# Patient Record
Sex: Male | Born: 1965 | Race: White | Hispanic: No | Smoking: Current every day smoker
Health system: Southern US, Community
[De-identification: ages and names within clinical notes are randomized; demographics above are authoritative.]

## PROBLEM LIST (undated history)

## (undated) DIAGNOSIS — M1990 Unspecified osteoarthritis, unspecified site: Secondary | ICD-10-CM

## (undated) DIAGNOSIS — K4090 Unilateral inguinal hernia, without obstruction or gangrene, not specified as recurrent: Secondary | ICD-10-CM

## (undated) DIAGNOSIS — J189 Pneumonia, unspecified organism: Secondary | ICD-10-CM

## (undated) DIAGNOSIS — F319 Bipolar disorder, unspecified: Secondary | ICD-10-CM

## (undated) DIAGNOSIS — G4700 Insomnia, unspecified: Secondary | ICD-10-CM

## (undated) DIAGNOSIS — M797 Fibromyalgia: Secondary | ICD-10-CM

## (undated) DIAGNOSIS — F329 Major depressive disorder, single episode, unspecified: Secondary | ICD-10-CM

## (undated) DIAGNOSIS — S0990XA Unspecified injury of head, initial encounter: Secondary | ICD-10-CM

## (undated) DIAGNOSIS — R7989 Other specified abnormal findings of blood chemistry: Secondary | ICD-10-CM

## (undated) DIAGNOSIS — IMO0002 Reserved for concepts with insufficient information to code with codable children: Secondary | ICD-10-CM

## (undated) DIAGNOSIS — M549 Dorsalgia, unspecified: Secondary | ICD-10-CM

## (undated) DIAGNOSIS — E039 Hypothyroidism, unspecified: Secondary | ICD-10-CM

## (undated) DIAGNOSIS — R251 Tremor, unspecified: Secondary | ICD-10-CM

## (undated) DIAGNOSIS — M8580 Other specified disorders of bone density and structure, unspecified site: Secondary | ICD-10-CM

## (undated) DIAGNOSIS — F32A Depression, unspecified: Secondary | ICD-10-CM

## (undated) DIAGNOSIS — S82142A Displaced bicondylar fracture of left tibia, initial encounter for closed fracture: Secondary | ICD-10-CM

## (undated) DIAGNOSIS — R945 Abnormal results of liver function studies: Secondary | ICD-10-CM

## (undated) DIAGNOSIS — I2510 Atherosclerotic heart disease of native coronary artery without angina pectoris: Secondary | ICD-10-CM

## (undated) DIAGNOSIS — S060X9A Concussion with loss of consciousness of unspecified duration, initial encounter: Secondary | ICD-10-CM

## (undated) DIAGNOSIS — IMO0001 Reserved for inherently not codable concepts without codable children: Secondary | ICD-10-CM

## (undated) DIAGNOSIS — M6282 Rhabdomyolysis: Secondary | ICD-10-CM

## (undated) DIAGNOSIS — J439 Emphysema, unspecified: Secondary | ICD-10-CM

## (undated) DIAGNOSIS — I10 Essential (primary) hypertension: Secondary | ICD-10-CM

## (undated) DIAGNOSIS — K589 Irritable bowel syndrome without diarrhea: Secondary | ICD-10-CM

## (undated) DIAGNOSIS — F419 Anxiety disorder, unspecified: Secondary | ICD-10-CM

## (undated) DIAGNOSIS — E785 Hyperlipidemia, unspecified: Secondary | ICD-10-CM

## (undated) DIAGNOSIS — K219 Gastro-esophageal reflux disease without esophagitis: Secondary | ICD-10-CM

## (undated) DIAGNOSIS — M7918 Myalgia, other site: Secondary | ICD-10-CM

## (undated) DIAGNOSIS — N189 Chronic kidney disease, unspecified: Secondary | ICD-10-CM

## (undated) DIAGNOSIS — G8929 Other chronic pain: Secondary | ICD-10-CM

## (undated) DIAGNOSIS — S060XAA Concussion with loss of consciousness status unknown, initial encounter: Secondary | ICD-10-CM

## (undated) HISTORY — DX: Depression, unspecified: F32.A

## (undated) HISTORY — PX: COLONOSCOPY: SHX174

## (undated) HISTORY — DX: Unspecified osteoarthritis, unspecified site: M19.90

## (undated) HISTORY — DX: Hypothyroidism, unspecified: E03.9

## (undated) HISTORY — DX: Other specified abnormal findings of blood chemistry: R79.89

## (undated) HISTORY — DX: Fibromyalgia: M79.7

## (undated) HISTORY — PX: ESOPHAGOGASTRODUODENOSCOPY: SHX1529

## (undated) HISTORY — DX: Major depressive disorder, single episode, unspecified: F32.9

## (undated) HISTORY — PX: UPPER GASTROINTESTINAL ENDOSCOPY: SHX188

## (undated) HISTORY — DX: Other specified disorders of bone density and structure, unspecified site: M85.80

## (undated) HISTORY — DX: Emphysema, unspecified: J43.9

## (undated) HISTORY — DX: Abnormal results of liver function studies: R94.5

## (undated) HISTORY — PX: INGUINAL HERNIA REPAIR: SUR1180

## (undated) HISTORY — PX: SPINAL CORD STIMULATOR IMPLANT: SHX2422

## (undated) HISTORY — PX: CERVICAL DISC SURGERY: SHX588

## (undated) HISTORY — DX: Myalgia, other site: M79.18

---

## 2000-04-20 ENCOUNTER — Encounter: Payer: Self-pay | Admitting: Sports Medicine

## 2000-04-20 ENCOUNTER — Ambulatory Visit (HOSPITAL_COMMUNITY): Admission: RE | Admit: 2000-04-20 | Discharge: 2000-04-20 | Payer: Self-pay | Admitting: Sports Medicine

## 2000-05-20 ENCOUNTER — Encounter: Payer: Self-pay | Admitting: Neurosurgery

## 2000-05-20 ENCOUNTER — Ambulatory Visit (HOSPITAL_COMMUNITY): Admission: RE | Admit: 2000-05-20 | Discharge: 2000-05-21 | Payer: Self-pay | Admitting: Neurosurgery

## 2000-06-08 ENCOUNTER — Encounter: Payer: Self-pay | Admitting: Neurosurgery

## 2000-06-08 ENCOUNTER — Ambulatory Visit (HOSPITAL_COMMUNITY): Admission: RE | Admit: 2000-06-08 | Discharge: 2000-06-08 | Payer: Self-pay | Admitting: Neurosurgery

## 2000-06-15 ENCOUNTER — Encounter: Admission: RE | Admit: 2000-06-15 | Discharge: 2000-08-06 | Payer: Self-pay | Admitting: Neurosurgery

## 2000-07-13 ENCOUNTER — Encounter: Payer: Self-pay | Admitting: Neurosurgery

## 2000-07-13 ENCOUNTER — Ambulatory Visit (HOSPITAL_COMMUNITY): Admission: RE | Admit: 2000-07-13 | Discharge: 2000-07-13 | Payer: Self-pay | Admitting: Neurosurgery

## 2000-08-07 ENCOUNTER — Ambulatory Visit (HOSPITAL_COMMUNITY): Admission: RE | Admit: 2000-08-07 | Discharge: 2000-08-07 | Payer: Self-pay | Admitting: Neurosurgery

## 2000-08-07 ENCOUNTER — Encounter: Payer: Self-pay | Admitting: Neurosurgery

## 2000-08-24 ENCOUNTER — Encounter: Admission: RE | Admit: 2000-08-24 | Discharge: 2000-10-20 | Payer: Self-pay | Admitting: Neurosurgery

## 2000-10-12 ENCOUNTER — Ambulatory Visit (HOSPITAL_COMMUNITY): Admission: RE | Admit: 2000-10-12 | Discharge: 2000-10-12 | Payer: Self-pay | Admitting: Neurosurgery

## 2000-10-12 ENCOUNTER — Encounter: Payer: Self-pay | Admitting: Neurosurgery

## 2000-11-18 ENCOUNTER — Ambulatory Visit (HOSPITAL_COMMUNITY): Admission: RE | Admit: 2000-11-18 | Discharge: 2000-11-18 | Payer: Self-pay | Admitting: Neurosurgery

## 2000-11-18 ENCOUNTER — Encounter: Payer: Self-pay | Admitting: Neurosurgery

## 2000-12-28 ENCOUNTER — Encounter: Admission: RE | Admit: 2000-12-28 | Discharge: 2001-01-30 | Payer: Self-pay | Admitting: Anesthesiology

## 2002-03-14 ENCOUNTER — Encounter: Payer: Self-pay | Admitting: Emergency Medicine

## 2002-03-14 ENCOUNTER — Encounter: Payer: Self-pay | Admitting: Internal Medicine

## 2002-03-15 ENCOUNTER — Encounter: Payer: Self-pay | Admitting: Internal Medicine

## 2002-03-15 ENCOUNTER — Inpatient Hospital Stay (HOSPITAL_COMMUNITY): Admission: EM | Admit: 2002-03-15 | Discharge: 2002-03-17 | Payer: Self-pay | Admitting: Emergency Medicine

## 2002-03-16 ENCOUNTER — Encounter: Payer: Self-pay | Admitting: Internal Medicine

## 2002-03-17 ENCOUNTER — Encounter: Payer: Self-pay | Admitting: Internal Medicine

## 2002-03-27 ENCOUNTER — Emergency Department (HOSPITAL_COMMUNITY): Admission: EM | Admit: 2002-03-27 | Discharge: 2002-03-27 | Payer: Self-pay | Admitting: Emergency Medicine

## 2002-04-12 ENCOUNTER — Encounter: Payer: Self-pay | Admitting: Internal Medicine

## 2002-07-16 ENCOUNTER — Ambulatory Visit (HOSPITAL_COMMUNITY): Admission: RE | Admit: 2002-07-16 | Discharge: 2002-07-16 | Payer: Self-pay | Admitting: Neurology

## 2002-07-16 ENCOUNTER — Encounter: Payer: Self-pay | Admitting: Neurology

## 2003-03-07 ENCOUNTER — Encounter
Admission: RE | Admit: 2003-03-07 | Discharge: 2003-06-05 | Payer: Self-pay | Admitting: Physical Medicine & Rehabilitation

## 2003-06-23 ENCOUNTER — Encounter
Admission: RE | Admit: 2003-06-23 | Discharge: 2003-09-21 | Payer: Self-pay | Admitting: Physical Medicine & Rehabilitation

## 2003-08-08 ENCOUNTER — Encounter: Payer: Self-pay | Admitting: Internal Medicine

## 2003-09-22 ENCOUNTER — Inpatient Hospital Stay (HOSPITAL_COMMUNITY): Admission: EM | Admit: 2003-09-22 | Discharge: 2003-09-24 | Payer: Self-pay | Admitting: Emergency Medicine

## 2003-09-27 ENCOUNTER — Encounter
Admission: RE | Admit: 2003-09-27 | Discharge: 2003-12-26 | Payer: Self-pay | Admitting: Physical Medicine & Rehabilitation

## 2004-01-17 ENCOUNTER — Encounter
Admission: RE | Admit: 2004-01-17 | Discharge: 2004-02-27 | Payer: Self-pay | Admitting: Physical Medicine & Rehabilitation

## 2004-02-27 ENCOUNTER — Encounter
Admission: RE | Admit: 2004-02-27 | Discharge: 2004-05-27 | Payer: Self-pay | Admitting: Physical Medicine & Rehabilitation

## 2004-03-12 ENCOUNTER — Ambulatory Visit: Payer: Self-pay | Admitting: Physical Medicine & Rehabilitation

## 2004-07-05 ENCOUNTER — Ambulatory Visit: Payer: Self-pay | Admitting: Physical Medicine & Rehabilitation

## 2004-07-05 ENCOUNTER — Encounter
Admission: RE | Admit: 2004-07-05 | Discharge: 2004-10-03 | Payer: Self-pay | Admitting: Physical Medicine & Rehabilitation

## 2004-10-11 ENCOUNTER — Encounter
Admission: RE | Admit: 2004-10-11 | Discharge: 2005-01-09 | Payer: Self-pay | Admitting: Physical Medicine & Rehabilitation

## 2004-10-15 ENCOUNTER — Ambulatory Visit: Payer: Self-pay | Admitting: Physical Medicine & Rehabilitation

## 2004-11-27 ENCOUNTER — Ambulatory Visit: Payer: Self-pay | Admitting: Physical Medicine & Rehabilitation

## 2005-03-27 ENCOUNTER — Ambulatory Visit: Payer: Self-pay | Admitting: Family Medicine

## 2005-04-04 ENCOUNTER — Ambulatory Visit: Payer: Self-pay | Admitting: Family Medicine

## 2005-04-29 ENCOUNTER — Ambulatory Visit: Payer: Self-pay | Admitting: Family Medicine

## 2005-05-22 ENCOUNTER — Ambulatory Visit (HOSPITAL_COMMUNITY): Admission: RE | Admit: 2005-05-22 | Discharge: 2005-05-22 | Payer: Self-pay | Admitting: Physical Therapy

## 2005-07-07 ENCOUNTER — Ambulatory Visit: Payer: Self-pay | Admitting: Family Medicine

## 2006-06-01 ENCOUNTER — Ambulatory Visit: Payer: Self-pay | Admitting: Family Medicine

## 2006-06-01 LAB — CONVERTED CEMR LAB
Albumin: 4.7 g/dL (ref 3.5–5.2)
Alkaline Phosphatase: 98 units/L (ref 39–117)
TSH: 2.135 microintl units/mL (ref 0.350–5.50)
Total Bilirubin: 0.2 mg/dL — ABNORMAL LOW (ref 0.3–1.2)

## 2006-06-29 ENCOUNTER — Ambulatory Visit: Payer: Self-pay | Admitting: Family Medicine

## 2006-07-06 ENCOUNTER — Ambulatory Visit (HOSPITAL_COMMUNITY): Admission: RE | Admit: 2006-07-06 | Discharge: 2006-07-06 | Payer: Self-pay | Admitting: Anesthesiology

## 2006-07-30 ENCOUNTER — Ambulatory Visit: Payer: Self-pay | Admitting: Family Medicine

## 2006-07-30 LAB — CONVERTED CEMR LAB
Albumin: 4.1 g/dL (ref 3.5–5.2)
Alkaline Phosphatase: 72 units/L (ref 39–117)
BUN: 7 mg/dL (ref 6–23)
Basophils Absolute: 0 10*3/uL (ref 0.0–0.1)
Cholesterol: 187 mg/dL (ref 0–200)
GFR calc Af Amer: 121 mL/min
HDL: 51.6 mg/dL (ref >39.0)
Hemoglobin: 15 g/dL (ref 13.0–17.0)
LDL Cholesterol: 110 mg/dL — ABNORMAL HIGH (ref 0–99)
Lymphocytes Relative: 17.7 % (ref 12.0–46.0)
MCHC: 34 g/dL (ref 30.0–36.0)
Monocytes Absolute: 0.7 10*3/uL (ref 0.2–0.7)
Monocytes Relative: 5.8 % (ref 3.0–11.0)
Neutro Abs: 8.6 10*3/uL — ABNORMAL HIGH (ref 1.4–7.7)
Potassium: 3.9 meq/L (ref 3.5–5.1)
Total Protein: 7.1 g/dL (ref 6.0–8.3)

## 2006-10-23 ENCOUNTER — Ambulatory Visit: Payer: Self-pay | Admitting: Internal Medicine

## 2006-10-23 ENCOUNTER — Encounter: Payer: Self-pay | Admitting: Internal Medicine

## 2006-10-28 DIAGNOSIS — F3110 Bipolar disorder, current episode manic without psychotic features, unspecified: Secondary | ICD-10-CM

## 2006-10-28 DIAGNOSIS — G8929 Other chronic pain: Secondary | ICD-10-CM

## 2006-10-28 DIAGNOSIS — E785 Hyperlipidemia, unspecified: Secondary | ICD-10-CM | POA: Insufficient documentation

## 2006-10-28 DIAGNOSIS — F319 Bipolar disorder, unspecified: Secondary | ICD-10-CM

## 2006-10-28 DIAGNOSIS — F329 Major depressive disorder, single episode, unspecified: Secondary | ICD-10-CM

## 2006-10-28 DIAGNOSIS — F411 Generalized anxiety disorder: Secondary | ICD-10-CM | POA: Insufficient documentation

## 2006-10-28 DIAGNOSIS — K219 Gastro-esophageal reflux disease without esophagitis: Secondary | ICD-10-CM

## 2006-10-28 HISTORY — DX: Gastro-esophageal reflux disease without esophagitis: K21.9

## 2006-10-28 HISTORY — DX: Other chronic pain: G89.29

## 2006-10-28 HISTORY — DX: Generalized anxiety disorder: F41.1

## 2006-10-28 HISTORY — DX: Bipolar disorder, current episode manic without psychotic features, unspecified: F31.10

## 2007-02-05 ENCOUNTER — Telehealth (INDEPENDENT_AMBULATORY_CARE_PROVIDER_SITE_OTHER): Payer: Self-pay | Admitting: *Deleted

## 2007-03-23 ENCOUNTER — Encounter: Admission: RE | Admit: 2007-03-23 | Discharge: 2007-06-21 | Payer: Self-pay | Admitting: Anesthesiology

## 2007-06-21 ENCOUNTER — Encounter: Payer: Self-pay | Admitting: Family Medicine

## 2007-07-28 ENCOUNTER — Encounter: Payer: Self-pay | Admitting: Family Medicine

## 2008-06-02 HISTORY — PX: SEPTOPLASTY: SUR1290

## 2008-08-09 ENCOUNTER — Ambulatory Visit: Payer: Self-pay | Admitting: Internal Medicine

## 2008-08-09 DIAGNOSIS — K589 Irritable bowel syndrome without diarrhea: Secondary | ICD-10-CM

## 2008-08-09 HISTORY — DX: Irritable bowel syndrome, unspecified: K58.9

## 2008-08-10 ENCOUNTER — Telehealth: Payer: Self-pay | Admitting: Internal Medicine

## 2008-10-26 ENCOUNTER — Ambulatory Visit (HOSPITAL_COMMUNITY): Admission: RE | Admit: 2008-10-26 | Discharge: 2008-10-27 | Payer: Self-pay | Admitting: Neurosurgery

## 2009-05-19 ENCOUNTER — Inpatient Hospital Stay (HOSPITAL_COMMUNITY): Admission: RE | Admit: 2009-05-19 | Discharge: 2009-05-24 | Payer: Self-pay | Admitting: Psychiatry

## 2009-05-19 ENCOUNTER — Emergency Department (HOSPITAL_COMMUNITY): Admission: EM | Admit: 2009-05-19 | Discharge: 2009-05-19 | Payer: Self-pay | Admitting: Emergency Medicine

## 2009-05-19 ENCOUNTER — Ambulatory Visit: Payer: Self-pay | Admitting: Psychiatry

## 2009-05-23 ENCOUNTER — Emergency Department (HOSPITAL_COMMUNITY): Admission: EM | Admit: 2009-05-23 | Discharge: 2009-05-23 | Payer: Self-pay | Admitting: Emergency Medicine

## 2010-09-02 LAB — CBC
Hemoglobin: 15.4 g/dL (ref 13.0–17.0)
MCHC: 33.6 g/dL (ref 30.0–36.0)
MCV: 93.3 fL (ref 78.0–100.0)
RDW: 12.9 % (ref 11.5–15.5)

## 2010-09-02 LAB — DIFFERENTIAL
Basophils Absolute: 0.2 10*3/uL — ABNORMAL HIGH (ref 0.0–0.1)
Basophils Relative: 2 % — ABNORMAL HIGH (ref 0–1)
Eosinophils Absolute: 0.2 10*3/uL (ref 0.0–0.7)
Eosinophils Relative: 2 % (ref 0–5)
Monocytes Absolute: 0.6 10*3/uL (ref 0.1–1.0)
Neutro Abs: 6.2 10*3/uL (ref 1.7–7.7)

## 2010-09-02 LAB — BASIC METABOLIC PANEL
CO2: 23 mEq/L (ref 19–32)
Calcium: 8.9 mg/dL (ref 8.4–10.5)
Chloride: 106 mEq/L (ref 96–112)
Glucose, Bld: 93 mg/dL (ref 70–99)
Sodium: 141 mEq/L (ref 135–145)

## 2010-09-02 LAB — RAPID URINE DRUG SCREEN, HOSP PERFORMED
Amphetamines: NOT DETECTED
Barbiturates: NOT DETECTED
Benzodiazepines: NOT DETECTED
Opiates: NOT DETECTED

## 2010-09-10 LAB — BASIC METABOLIC PANEL
BUN: 9 mg/dL (ref 6–23)
Calcium: 10.2 mg/dL (ref 8.4–10.5)
Creatinine, Ser: 0.76 mg/dL (ref 0.4–1.5)
GFR calc non Af Amer: >60 mL/min (ref >60)
Glucose, Bld: 82 mg/dL (ref 70–99)
Potassium: 4.6 mEq/L (ref 3.5–5.1)

## 2010-09-10 LAB — CBC
Platelets: 262 10*3/uL (ref 150–400)
RDW: 13 % (ref 11.5–15.5)
WBC: 7.2 10*3/uL (ref 4.0–10.5)

## 2010-10-15 NOTE — Op Note (Signed)
NAMEJONG, [name]                ACCOUNT NO.:  [id number]   MEDICAL RECORD NO.:  [id number]          PATIENT TYPE:  OIB   LOCATION:  3534                         FACILITY:  [facility]   PHYSICIAN:  Reinaldo Meeker, M.D. DATE OF BIRTH:  03/14/1966   DATE OF PROCEDURE:  10/26/2008  DATE OF DISCHARGE:                               OPERATIVE REPORT   PREOPERATIVE DIAGNOSIS:  Degenerative disk disease with chronic back and  leg pain.   POSTOPERATIVE DIAGNOSIS:  Degenerative disk disease with chronic back  and leg pain.   PROCEDURE:  Insertion of permanent spinal cord stimulator.   SURGEON:  Reinaldo Meeker, MD   PROCEDURE IN DETAIL:  After being placed in the prone position, the  patient's thoracic spine, lumbar spine, left buttock area were prepped  and draped in the usual sterile fashion.  Localizing fluoroscopy was  used prior to incision to identify the appropriate level.  Midline  incision made over the spinous process of T10 and T11.  Using Bovie  cutting current, the incision was carried down to the spinous processes.  Subperiosteal dissection was then carried out on the left side of the  spinous process and lamina of T11 and self-retaining retractor was  placed for exposure.  X-rays showed approach to the appropriate level.  Left hemilaminotomy was performed and enlarged appropriately in order to  be able to slide the Tripole 10-mm wide [employer] Jude lead into an adequate  position.  Initial attempts kept having the lead go more to the right  than we were happy with.  We kept trying to readjust this until we got  into a good position.  We then made a battery pouch and generator pouch  in the left buttock without difficulty.  Any bleeding was controlled  with unipolar coagulation at that level.  A pass was made subcutaneously  from the buttock incision up to the thoracic incision and the lead  passed without difficulty.  It was then placed in excellent position,  which was confirmed  with fluoroscopy.  This went from the T8-9 disk to  the upper edge of the T10, which was felt to the best location based on  the patient's response of trial.  The lead was then secured to the  battery generator and impedance testing showed good impedances on both  leads.  The lead was then secured with the aid of torque, Allen wrench  and the battery pack inserted without difficulty.  Irrigation was  carried out at both incisions.  Any bleeding at the upper level  controlled with bipolar coagulation and Gelfoam.  Both the wounds were  closed with interrupted Vicryl on the fascia, subcutaneous, and  subcuticular layers and Dermabond on the skin.  Sterile dressings were  then applied.  The patient was extubated and taken to recovery room in  stable condition.           ______________________________  Reinaldo Meeker, M.D.     ROK/MEDQ  D:  10/26/2008  T:  10/27/2008  Job:  [id number]
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
## 2010-10-18 NOTE — Assessment & Plan Note (Signed)
DATE OF VISIT:  July 08, 2004.   MEDICAL RECORD NUMBER:  [id number].   DATE OF BIRTH:  1966-04-22.   [name] is here regarding his post laminectomy cervical syndrome with  myofascial components.  I have not seen him since October.  He states that  his pain has been fairly stable.  He still rates it at about a 7/10.  He is  interested in trying to increase his OxyContin a bit today.  He has been  working on posture with exercises and stretching, although still tended to  fall in the forward head position.  When he tries to exaggerate his cervical  posture, he tends to get low back pain.  The pain improves with heat, ice  and medications.  It generally worsens with walking, bending and sitting.  It effects many of his quality of life indices as well.  He has been fairly  well controlled regarding his psychiatric condition.   SOCIAL HISTORY:  He continues to live with his wife who works full time.  The patient smokes daily.   REVIEW OF SYSTEMS:  The patient reports constipation.  Denies fever, chills,  weight changes, recent problems with his mood, weakness, numbness, dizziness  and problems with cold or flu symptoms.  The patient has had no heart  problems.   PHYSICAL EXAMINATION:  The blood pressure is 118/59, the pulse is 94 and the  respiratory rate is 22.  Saturating 97% on room air.  The patient walks with  a slight limp to the left side.  Affect is bright and much more animated  than I have seen him recent months.  Appearance is better kept that usual as  well.  His neck remains painful on the spinous processes from about C7 up to  C3 and C2 levels.  Rhomboids are also tight, left greater than right side,  with spasm.  They seem to improve with better posture, but than his low back  began to hurt.  Cervical range of motion was fair today.  He had normal  motor and sensory exam of both upper extremities.  Shoulder range of motion  was fairly intact.  The patient needed  cues for posture, but overall his  posture was improve from prior exams.  He presents with the shoulders  internally rotated and the head in the forward position.  The low back is  slightly painful to movement, particularly with facet maneuvers, left  greater than right, today.  Cognitively the patient is appropriate.  The  heart is regular rate and rhythm.  The lungs were clear.  The abdomen was  soft and nontender.  The extremities showed no cyanosis, clubbing or edema.   ASSESSMENT:  1.  Status post cervical laminectomy.  2.  Myofascial pain.  3.  Bicipital tendonitis of the left shoulder.  4.  Fibromyalgia.  5.  Low back pain with levoscoliosis and probable facet arthropathy.   PLAN:  1.  Will increase OxyContin to 40 mg q.12h.  Will decrease fentanyl patch to      75 mcg q.72h. with the goal to decrease further if possible.  2.  The patient needs to continue with stretching and regular exercises to      his low back and he needs to work on his cervical range as well.  3.  Continue oxycodone for breakthrough pain at 5 mg q.8h. p.r.n.  4.  Discussed a low-residence weight program with the patient today, which I  think would be beneficial.  He is unable to do a lot of the exercises he      needs to do which place him in a supine or prone position because it      hurts too much to get back up.  5.  The patient may use Flexeril for spasms q.8h. p.r.n.  6.  Continue Restoril 30 mg q.h.s. for sleep.  7.  I will see the patient back in one month's time.      ZTS/MedQ  D:  07/08/2004 15:58:03  T:  07/08/2004 18:31:26  Job #:  [id number]
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
## 2010-10-18 NOTE — Assessment & Plan Note (Signed)
Yovany is back regarding his post laminectomy syndrome. He did well with the  bicipital tendon injection and the sternocleidomastoid injection at last  visit. He did not like the Kadian, however, as he did not feel it relieved  his pain, and it caused him significant constipation. He presents today with  pain still in the 7 out of 10 range. Pain is mostly in the cervical region  into the shoulders. The left shoulder does remain tender, more so than the  right. His mid back is also bothering him more of late. He was unable to  take the Neurontin secondary to spasm. He has been somewhat adherent to his  home stretching and range of motion program.   REVIEW OF SYSTEMS:  The patient denied any shortness of breath, chest pain,  nausea, diarrhea, vomiting. He does note constipation and as spoken of  above. He has had no cold or flu like symptoms or weakness and/or numbness  in the lower or upper extremities. Sleep remains interrupted. Does have some  occasional tremors. He is stable from a psychiatric standpoint.   PHYSICAL EXAMINATION:  The patient is generally pleasant in no acute  distress. Blood pressure is 118/75, pulse is 90, saturating 100% on room  air. The patient continues to sit with a forward head posture. He has pain  and tightness throughout the trapezius and rhomboid musculature,  sternocleidomastoid and levator scapular muscles. Trapezius areas appear  less tender today. He is still very tender in both sternocleidomastoid  regions. Suboccipital musculature was also taunt. Motor exam was 5/5.  Sensory exam was intact. Reflexes were 2+ in both the upper and lower  extremities today. Range of motion of the neck remains poor, particularly  with extension as this causes most of his pain. Lumbar range of motion was  stable.   ASSESSMENT:  1. Post laminectomy syndrome.  2. Myofascial pain.  3. Bicipital tendinitis involving the left short head.  4. Questionable fibromyalgia.   PLAN:  1. We will perform no injections today. We will try him on a new pain     regimen, discontinuing the Kadian and beginning him on a fentanyl patch     75 mcg q.72h. Also started him back on low dose OxyContin with the goal     of reducing this to off over the next few months' time.  2. He may continue his oxycodone IR for breakthrough pain one q.8h. p.r.n.  3. Will continue with Flexeril 10 mg for spasms.  4. We will hold off on trying anything new for neuropathic pain at this     point.  5. I encouraged ongoing stretching and range of motion exercise.  6. May consider followup MR scan of the C spine.  7. See the patient back in approximately one month's time.      Ranelle Oyster, M.D.   ZTS/MedQ  D:  08/01/2003 13:15:34  T:  08/01/2003 13:41:20  Job #:  [id number]   cc:   [facility]  [place]

## 2010-10-18 NOTE — Assessment & Plan Note (Signed)
REFERRING PHYSICIAN:  Lelon Perla, M.D.   [name] is back regarding his post laminectomy syndrome and cervical pain.  He  missed his last appointment due to car troubles.  He comes in today  complaining of neck pain, predominantly in the upper neck but also in the  middle back around the C7 level.  He states his pain is at a 7/10 on  average.  He is having problems sleeping.  Low back pain is an issue but not  to such an extent.  He does not feel that the Fentanyl patch is helping,  although he is tolerating the medicine itself.  He remains on OxyContin 20  mg q.12h. in addition to the Fentanyl which is at 75 mcg q.72h.  He uses  oxycodone 5 mg q.8h. p.r.n. for breakthrough pain.  Flexeril helps to a  certain extent with spasms.  His family doctor placed him on Klonopin to  help him to rest but this has not been beneficial at this point.  Trazodone  at 150 mg did not help.   REVIEW OF SYMPTOMS:  The patient denies any short of breath, chest pain,  wheezing or coughing.  He does have some weakness, dizziness, and spasms.  He denies any visual complaints, problems with mood or headaches.  He denies  nausea, vomiting, reflux and diarrhea.  He has had occasional obstipation.  Denies fever or weight changes.   PHYSICAL EXAMINATION:  GENERAL APPEARANCE:  The patient walks with slight  limp.  Affect is generally alert.  Appearance is slightly disheveled.  VITAL SIGNS:  Blood pressure is 112/63, pulse 85, he is saturating 98% on  room air .  NEUROLOGIC:  The patient had palpable pain with palpation of the right upper  trapezius as well as the suboccipital muscle to a lesser extent.  There is  pain with palpation near the C6 and T1 vertebral bodies.  He has severe  spasm in the right middle rhomboid.  Posture remains very poor with a  significant resting flexion in the cervical spine and thoracic spine with  the head forward posture and extension of the upper cervical muscles to  compensate.   The remainder of range of motion was minimal in all planes  anywhere from 20 to 40 degrees.  All motion seemed to cause discomfort to a  certain extent.  Motor examination in the upper and lower extremities is  near 5/5.  Reflexes are 2+ and sensory examination is intact.   ASSESSMENT:  1. Post laminectomy syndrome, 722.81.  2. Myofascial pain, 723.9.  3. Bicipital tendinitis of the left shoulder, 726.12.  4. Questionable fibromyalgia, 729.1.   PLAN:  1. After informed consent, we injected four areas with trigger point shots     today using 2 mL of 1% lidocaine.  We targeted the right upper trapezius,     right middle rhomboid, and the paraspinal musculature at T1 and C6.  The     patient had relief with these.  2. Will increase his Fentanyl patch to 100 mcg q.72h.  We will keep the     OxyContin on board for now.  3. The patient will use oxycodone IR for breakthrough pain q.8h. p.r.n.  4. He may continue with Flexeril 10 mg q.8h. p.r.n. for spasms.  5. For sleep, we added Restoril 30 mg q.h.s.  The patient is to stop his     Klonopin.  6. The patient needs to work on his posture which is horrible at  this point     and he states the desire to do so, although has not followed through with     it at this point.  7. The patient plans on smoking cessation beginning next month.  8. Will see the patient back in one month's time.      Ranelle Oyster, M.D.   ZTS/MedQ  D:  09/29/2003 10:32:12  T:  09/29/2003 11:38:24  Job #:  [id number]   cc:   Lelon Perla, M.D.   [facility]  [place], [place]

## 2010-10-18 NOTE — Discharge Summary (Signed)
NAME:  [name], [name]                          ACCOUNT NO.:  [id number]   MEDICAL RECORD NO.:  [id number]                   PATIENT TYPE:  INP   LOCATION:  0345                                 FACILITY:  [facility]   PHYSICIAN:  Iva Boop, M.D. [facility]           DATE OF BIRTH:  Oct 03, 1965   DATE OF ADMISSION:  03/13/2002  DATE OF DISCHARGE:  03/17/2002                                 DISCHARGE SUMMARY   ADMISSION DIAGNOSES:  1. The patient is a 45 year old white male with recurrent nausea, vomiting,     abdominal pain, diarrhea.  Etiology not clear.  Rule out medication-     induced gastroparesis, functional etiology secondary to severe anxiety     versus severe irritable bowel syndrome.  2. Recent diagnosis of Mallory-Weiss tear and gastritis made in [place],     [place].  3. Gastroesophageal reflux disease.  4. Chronic pain syndrome on chronic narcotics for cervical radiculopathy and     low back pain.  5. Agarophobia.   DISCHARGE DIAGNOSES:  1. Recurrent nausea, vomiting, and abdominal pain with sporadic diarrhea.     Gastrointestinal workup negative, and symptoms felt to be functional in     nature/irritable bowel syndrome.  The patient has had extensive negative     gastrointestinal evaluation at this time.  2. Recent diagnosis of Mallory-Weiss tear and gastritis made in [place],     [place].  3. Gastroesophageal reflux disease.  4. Chronic pain syndrome on chronic narcotics for cervical radiculopathy and     low back pain.  5. Agarophobia.   CONSULTATIONS:  None.   PROCEDURES:  None.   X-RAYS::  1. Small-bowel follow-through.  2. Gastric emptying scan.  3. [facility] HIDA scan.  4. Upper abdominal ultrasound and plain abdominal films.   BRIEF HISTORY:  The patient is a pleasant 45 year old white male, previously  a primary patient of Dr. Francesca Jewett in [place], also known to Dr. Braulio Conte  in [place], whose wife works for [employer] in [place].   The  patient has had a significant history of chronic pain syndrome secondary to  low back pain and cervical radiculopathy for which he has been on chronic  narcotics in the form of OxyContin and Roxicet .  He is also disabled  secondary to severe social disorder/agarophobia and anxiety.  The patient  has a history of IBS and GERD.  He reports recurrent episodes of nausea,  vomiting, and diarrhea which have recurred over the past year or so at least  five or six times.  He had been hospitalized a few times a [facility]  for similar symptoms, twice within the past month.  At this time he was just  discharged on Friday, March 11, 2002, from [place] after an admission  with nausea, vomiting, hematemesis, and diarrhea.  He was endoscoped, found  to have a Mallory-Weiss tear and evidence of gastritis.  The  patient reports  that he was discharged on March 11, 2002, still not feeling well and not  keeping down p.o.'s.  Over the weekend he had continued to have vomiting and  abdominal pain and presented to the ER here when he was unable to keep any  of his medicines down.  He was noted to be hemodynamically stable and  admitted for further diagnostic workup and hydration.  At the time of  admission it was felt that some of his symptoms may have been secondary to  withdrawal given all of his usual pain medications, etc.   LABORATORY DATA:  On March 14, 2002, WBC 10.1, hemoglobin 15.4, hematocrit  44.4, MCV of 87, platelets 453.  On March 15, 2002, WBC 6.8, hemoglobin  12.3, hematocrit of 35, platelets 323.  On March 14, 2002, potassium 3.3.  Follow-up on March 15, 2002, with a potassium of 3.8, glucose was 105.  On  admission BUN 12, creatinine 0.8, albumin 4.3.  Urinalysis negative with the  exception of 40 ketones.  LFTs negative with the exception of a mildly  elevated alkaline phosphatase at 139.  Stool for wbc's, few present.  Stool  for C. difficile negative.  Stool  culture negative.  Stool for O&P negative.   X-ray studies:  Small-bowel follow-through on March 16, 2002, normal.  [facility]  HIDA scan on March 17, 2002:  Final report pending.  Preliminary report  normal, with normal ejection fraction.  Abdominal ultrasound on March 14, 2002, showed mild fatty infiltration of the liver, otherwise negative.  Plain abdominal films on March 14, 2002:  Slight increase in intestinal  gas pattern.  Possible mild ileus.  Gastric emptying scan on March 15, 2002:  Abnormal, with 11.4% remaining at two hours.   HOSPITAL COURSE:  The patient was admitted to the service of Dr. Stan Head, who was covering the hospital.  He was placed on IV fluids for  rehydration, continued on his usual medications, and started on IV Protonix  and IV Reglan as well as antiemetics.  We tried to keep him on his oral pain  medications, which he seemed to be able to keep down after admission.  He  underwent workup as outlined above.  Initial films showed a possible mild  ileus.  However, the remainder of his studies were negative.  The patient  continued to complain of various abdominal pains and intermittent nausea and  vomiting; however, he was definitely able to keep down p.o.'s when observed  on an intermittent basis.  He complained of increased diarrhea possibly  associated with Reglan, and this was discontinued after a normal gastric  emptying scan.  On March 17, 2002, after a complete evaluation, the  patient was discharged to home in stable condition with instructions to  remain on all of his usual medications, which include:  1. Paxil 80 mg q.a.m.  2. Zyprexa, I believe, 15 h.s.  3. Neurontin 100 t.i.d.  4. OxyContin 80 b.i.d.  5. Roxicet  or Roxicodone 15 mg up to 5 tablets q.d. p.r.n.  6. Zanaflex 4 mg t.i.d.  7. Levbid 1 p.o. b.i.d.  8. FiberCon daily.  9. Aspirin q.o.d.  10.      Multivitamin daily.  11.      Lidoderm patch 5% daily.  12.      Lomotil  p.r.n. 13.      Prilosec 20 b.i.d.   I did give him a prescription for Lomotil to use on a p.r.n.  basis and also  gave him a prescription for Robinul Forte 2 mg p.o. b.i.d. to try in place  of Levbid to see if this will benefit his symptoms.  The patient and his  wife wish to switch to [facility] for his GI care.  He was given a follow-up  appointment with Dr. Stan Head on April 12, 2002, at 1:30 p.m.  I have  asked them to establish a new primary care physician if they do not wish to  return to Dr. Jeanne Ivan care.  The patient and his wife were also reassured  that he has had an extensive evaluation and that we have not found any  significant problems and that they should initially attempt to try to  control these recurrent episodes at home with antiemetics and antidiarrheals  as needed.  The patient does have follow-up with Dr. Vear Clock  for pain management and Dr. Jennelle Human with psychiatry to discuss altering his  regimen including using a Duragesic patch in place of his OxyContin, which  may be helpful during these episodes of recurrent nausea and vomiting,  particularly as he will not suffer the withdrawal effects.       Mike Gip, P.A.-C. [facility]                Iva Boop, M.D. [facility]    AE/MEDQ  D:  03/17/2002  T:  03/18/2002  Job:  [id number]

## 2010-10-18 NOTE — Assessment & Plan Note (Signed)
[name] is back regarding his chronic low back and neck pain.  He had good  results with the  left trapezius trigger point injection as well as the left  bicipital tendon injection.  He did not place any ice to this left shoulder  after we injected it and has been existing with his positional exercises.  He tells me that OxyContin 80 mg was generic and did not help his pain.  He has used Oxycodone 5 mg for breakthrough pain q.8h.  We increased the  Neurontin to 600 mg t.i.d. and he noted no significant affects.  Sleep has  still been generally poor.   The patient rates his pain today at average of 7 out of 10.  The patient's  pain is noted in the shoulders, mid back and into the hips and knees.  The  patient was given Klonopin 1.5 mg by Dr. Drue Novel for sleep and this does  not seem to be beneficial.  He uses Flexeril on a p.r.n. basis with some  minimal relief.  The patient is generally frustrated by his pain and wants  better control.   REVIEW OF SYSTEMS:  The patient is without any shortness of breath, chest  pain, nausea, vomiting or diarrhea.  No cold or flu-like symptoms.  No  weakness or numbness in the extremities.  Sleep is noted as above and  remains interrupted at best.  The patient does note some tremors.  Psychiatrically he has been stable.   PHYSICAL EXAMINATION:  GENERAL:  The patient is pleasant and in no acute  distress.  VITAL SIGNS:  Blood pressure is 120/76, pulse is 85, saturating 100% on room  air.   The patient continues to sit with a significant short head posture.  He has  some discomfort in the rhomboids bilaterally.  Trapezius muscles are much  less tender today.  He denies significant tenderness in the upper  sternocleidomastoid on the left side.  The facet areas were minimally  tender.  He did have some tenderness along the soft tissue in the left  suboccipital and left semispinalis regions.  Motor exam was nearly 5 out of  5.  Sensory exam was intact.  He  had tenderness once again along the short  head of the biceps tendon on the left side.  The right side was nontender.  Low back remained essentially unchanged with some nonspecific tenderness  along the lumbar facets and paraspinous.  Lower extremity strength was 5 out  of 5.  Reflexes were 2+ and normal sensation throughout.  Lumbar range of  motion was 60 with flexion, 45 degrees extension, 25 with lateral bending  and rotation.   ASSESSMENT:  1. Failed back syndrome.  2. Myofascial pain.  3. Continued biceps tendonitis involving the left short head.  4. Questionable fibromyalgia.  5. Bipolar disorder.  6. Possible C5 radiculopathy on the left.   PLAN:  1. After informed consent we injected both the left bicipital tendon short     head and the left sternocleidomastoid region today with 3 cc of 1%     lidocaine and 40 mg of Kenalog/2 cc of 1% lidocaine respectively.  The     patient tolerated these well.  2. For better baseline pain relief will change him from OxyContin 80 mg     q.12h to Kadian 60 mg q.12h.  I asked him to give this at least a 1 week     period to work.  He will continue on Oxycodone  5 mg q.8h for break     through pain.  3. Continue with Neurontin.  4. Continue with his postural exercises for his neck.  5. He will need something different for sleep for which I will defer to     psychiatry.  6. I will see the patient back in approximately 1 month's time.      Ranelle Oyster, M.D.   ZTS/MedQ  D:  06/26/2003 17:02:28  T:  06/27/2003 06:02:48  Job #:  [id number]   cc:   [facility]  [place], [place], M.D.  [address]  [place] [address]  Fax: [phone]

## 2010-10-18 NOTE — Assessment & Plan Note (Signed)
[name] is back regarding his post laminectomy cervical syndrome with postural  and myofascial components.  He states he has had increased pain between the  scapulae as well as the low back after feeling a pop or spine moving apart  after he bent over on two occasions.  Since this point, his pain has  increased.  He rates his pain as a 6 out of 10.  He describes it as sharp,  stabbing, and constant.  Interferes with general activity, relations with  others, and enjoyment of life.  The pain seems to be worse in the morning  and at night.  It worsens with walking, bending, sitting, and inactivity.  It improves with rest and his medications.  He maintains 75 mcg Duragesic  patch every three days as well as OxyContin 40 mg q.12h.  He is using  oxycodone 5 mg q.8h. p.r.n.  He is not consistent with exercises.  He is  walking very little at home.  He rarely drives.  Psychiatrically, he has  been fairly stable since last visit.   SOCIAL HISTORY:  His wife and child live with him.  The patient still smokes  one and a half packs per day.   REVIEW OF SYSTEMS:  The patient reports numbness, tremor, tingling, trouble  walking, depression, anxiety, constipation, decreased appetite, urine  retention.   PHYSICAL EXAMINATION:  VITAL SIGNS:  Blood pressure is 111/69, pulse is 67,  respiratory rate is 16.  He is sating 98% on room air.  GENERAL:  The patient is flat and disheveled.  He had multiple marks and  adhesive tracks left on the skin from his prior patches which he had not  cleaned off.  The patient seemed to initiate fairly well in the room with me  today and was fairly alert.  MUSCULOSKELETAL:  Motor strength is 5/5.  Coordination was slightly  decreased.  Sensation was intact.  No obvious dyskinesias were seen today.  The patient taut bands of muscle on the right thoracic paraspinal area at  the T5-T4 level.  There is some generalized spasm in the lower lumbar spine.  He had counter clockwise  rotation of the facets at L4, clockwise rotation at  T4.  The patient continued with the head forward posture.  He has pain in  the upper cervical spinous processes today.  MENTAL STATUS:  Cognitively, the patient was generally appropriate.  HEART:  Regular rate and rhythm.  LUNGS:  Clear.  ABDOMEN:  Soft nontender.  EXTREMITIES:  Intact with good color.   ASSESSMENT:  1.  Status post cervical laminectomy.  2.  Myofascial pain and postural abnormalities.  3.  Bicipital tendonitis to the left shoulder.  4.  Questionable fibromyalgia.  5.  Bipolar disorder.   PLAN:  1.  Would like to consolidate his long acting opiates into one.  We will      taper off OxyContin and begin the patient on 125 mcg of Fentanyl to be      taken every 72 hours.  The patient agreed to this approach.  We will      still use oxycodone 5 mg q.8h. p.r.n. for breakthrough pain.  2.  After informed consent, we injected the right T4 trigger point today      with 2cc of 1% lidocaine.  The patient tolerated this well.  3.  The patient needs to pursue active stretching and aerobic exercise as      tolerated.  4.  I gave the patient  a flyer pertaining to IDD therapy.  They will get      back to me if they decide they would like to pursue this to work on      thoracic and lumbar alignment and posture.  5.  I gave the patient chlorohydrate 500 mg q.h.s. for sleep.  He will stop      his Restoril.  6.  I will see the patient back in about a month's time.       ZTS/MedQ  D:  10/15/2004 16:56:39  T:  10/16/2004 00:43:40  Job #:  [id number]

## 2010-10-18 NOTE — Op Note (Signed)
[facility]  Patient:    [name], [name]                       MRN: [id number] Proc. Date: 05/20/00 Adm. Date:  [id number] Disc. Date: [id number] Attending:  Donalee Citrin P                           Operative Report  PREOPERATIVE DIAGNOSIS:  Cervical radiculopathy C5 and C6, left greater than right.  POSTOPERATIVE DIAGNOSIS:  Cervical radiculopathy C5 and C6, left greater than right.  OPERATION PERFORMED:  SURGEON:  Gary P. Roney Jaffe., M.D.  ASSISTANT:  Julio Sicks, M.D.  ANESTHESIA:  General endotracheal.  IV FLUIDS:  1200 cc.  ESTIMATED BLOOD LOSS:  Less than 100.  INDICATIONS FOR PROCEDURE:  The patient is a very pleasant 45 year old gentleman who has had longstanding progressive worsening neck and arm pain much worse in his left arm, that radiates into his shoulder as well as into his forearm with occasional numbness and tingling in the first two fingers of his left hand.  It also goes down his right arm to his first two fingers occasionally but nowhere near as bad as his left.  He has failed conservative therapy with physical therapy.  He is on escalating doses of narcotic and his pain has gotten progressively worse.  His physical exam was consistent with weakness in his deltoids, biceps, and triceps bilaterally, left greater than right to about 4+ out of 5.  Preoperative imaging revealed spondylosis at 4-5 and 5-6 predominantly on the left C5 nerve root and bilaterally at C6.  We extensively discussed the risks and benefits of surgery with the patient, who decided to proceed.  DESCRIPTION OF PROCEDURE:  The patient was brought to the operating room and was induced under general anesthesia.  He was positioned supine.  The right side of his neck was prepped and draped after a shoulder roll had been placed and his neck in slight extension.  Preoperative x-ray confirmed a marker over the C5 vertebral body.  A curvilinear incision was made  from just off the midline to the anterior border of the sternocleidomastoid with a twin blade scalpel.  The superficial layer of the platysma was dissected out and the platysma was then divided longitudinally.  The deep layer of the platysma was then divided out and the avascular plane between the sternocleidomastoid and the omohyoid was developed down to the prevertebral fascia.  The carotid was palpated and retracted laterally and the plane between the carotid and the esophagus was developed down to the prevertebral fascia.  The prevertebral fascia was then divided with Kittners.  Then intraoperative x-ray confirmed the C4-5 interspace, then the longus coli was reflected laterally overlying the body of C4, C5 and C6.  A self-retaining retractor was placed and the interspace at C4-5 and C5-6 were incised with an 11 blade scalpel and pituitary rongeurs cleaned out the anterior part of the annulus.  Attention was first taken to C4-5 interspace.  The Anspach drill with a blue 8 drill bit was used to drill off the remainder of the anterior margin of the annulus down to the posterior longitudinal ligament and the osteophytes on the posterior margin of the C5 vertebral body.  Then using a 1 and 2 mm Kerrison punch, the remainder of the posterior annulus and the posterior longitudinal ligament was removed in piecemeal fashion.  There was noted to  be a large osteophyte coming off the C4 vertebral body compressing the proximal aspect of the C5 nerve root on the left.  This was cleaned off.  The thecal sac was visualized and the C5 nerve root foramen was opened up both bilaterally.  At the end of the diskectomy the thecal sac and both nerve roots were completely decompressed. The wound was copiously irrigated.  Gelfoam was used to maintain hemostasis. Then attention was taken to the C5-6 interspace. This procedure was repeated with the Anspach drill with the blue 8 drill bit was used to drill down to  the posterior osteophyte.  Then a 1 and 2 mm Kerrison punch were used to get underneath the osteophytes and remove the posterior margin of the annulus. The posterior longitudinal ligament was identified, removed in piecemeal fashion.  A couple of large disk fragments were removed that were compressing the ligament against the C5 and C6 nerve roots bilaterally.  These were removed with ease and the remainder of the vertebral bodies and end plates were underbitten to expose the thecal sac and both C6 nerve roots.  They were probed with the black hook nerve probe, noted to be completely decompressed. This was also copiously irrigated.  Gelfoam was overlaid.  7 mm fibular allograft was then inserted after the interbody spreader had been placed under compression and then attention was taken to the 4-5 interspace.  A 6 mm fibular allograft was inserted after the interbody spreader had been placed also under compression.  The wound was copiously irrigated.  The remainder of the anterior margin of the vertebral bodies were cleaned off to receive the plate.  A 42.5 plate was selected and sized up and noted to be in good position, then the fixed drill guide with a 13 mm drill bit was used to drill the pilot hole in the C6 vertebral body on the right.  Then attention was taken, this was tapped with a 13 mm tap and a 13 mm screw was inserted.  Then attention was taken up to the C4 vertebral body.  This procedure was repeated at both left and right side at C4 and then the remainder of the screw was placed at C6.  ____________two screws were placed in C5 in similar fashion. The wound was copiously irrigated.  Postoperative x-ray confirmed good location of bone graft and the plates and screws.  Meticulous hemostasis was maintained.  The platysma was closed with 3-0 interrupted Vicryls.  The skin was closed with a 4-0 running subcuticular.  Benzoin and Steri-Strips were applied.  At the end of the case all  needle counts and sponge counts were correct. DD:  05/20/00 TD:  05/21/00 Job: [id number]

## 2010-10-18 NOTE — H&P (Signed)
NAME:  [name], [name]                          ACCOUNT NO.:  [id number]   MEDICAL RECORD NO.:  [id number]                   PATIENT TYPE:  INP   LOCATION:  5504                                 FACILITY:  [facility]   PHYSICIAN:  Lelon Perla, M.D.               DATE OF BIRTH:  [birth year]   DATE OF ADMISSION:  09/22/2003  DATE OF DISCHARGE:                                HISTORY & PHYSICAL   ADMISSION DIAGNOSIS:  Near syncope.   HISTORY OF PRESENT ILLNESS:  The patient is a 45 year old white male who  presents to the office with multiple episodes of near syncope, two in the  last two days.  The patient gives a history of last night passing out,  falling, and hitting his head on a fan, though there are no witnesses to  tell us how long he was out for.  His young daughter was the only person who  saw anything happen.  The patient states he has been feeling very weak and  dizzy lately.  Denies any chest pain, shortness of breath, or palpitations.   PAST MEDICAL HISTORY:  1. Hyperlipidemia.  2. Anxiety.  3. Panic disorder with severe depression.  4. GERD.  5. IBS.  6. Closed head injury in the past.   PAST SURGICAL HISTORY:  In December 2001, he had a diskectomy and a fusion  of C4-5 and C5-6.   FAMILY HISTORY:  His father diabetes, MI, and CABG.  Mother with rheumatoid  arthritis, hypertension, MI, and CHF.  A brother with diabetes and  hypertension.   MEDICATIONS:  1. Paxil 40 mg daily.  2. Zyprexa 20 mg at night.  3. Lipitor 40 mg daily.  4. Wellbutrin 300 mg in the morning.  5. Risperdal 10 mg in the morning and 4 mg at night.  6. OxyContin [facility].  7. Lithium 450 mg q.a.m. and 1-1/2 at night.  8. Duragesic patch 75 mcg every 72 hours.  9. Hycosamine p.r.n.  10.      Phenergan p.r.n.  11.      Lomotil.  12.      Percocet.  13.      Flexeril.   PHYSICAL EXAMINATION:  GENERAL:  The patient was awake, alert, and oriented,  but very dizzy when  he sat up.  VITAL SIGNS:  175 pounds, temperature is 98.2, pulse is 108, respirations  20, blood pressure when sitting was 116/74, when laying down was 122/78,  when standing up was 88/58.  HEENT:  Eyes:  Pupils equal, round, reactive to light.  NECK:  Supple, no JVD, no bruits.  HEART:  Positive S1 and S2.  No murmurs were appreciated.  LUNGS:  Clear bilaterally.  No wheezes, rhonchi, or rales.  NEUROLOGIC:  Cranial nerves were intact.  The patient was orthostatic.   LABORATORY DATA:  EKG showed flipped T-waves in 2, 3, AVF.  A repeat EKG  done shortly after showed flattening of the T-waves in 2, 3, and AVF.   ASSESSMENT AND PLAN:  Near syncope.  Rule out myocardial infarction.  Will  admit to telemetry with intravenous fluids, cardiology consult,  echocardiogram, carotid Dopplers, CPK x3.                                                Lelon Perla, M.D.    Shawnie Dapper  D:  09/22/2003  T:  09/24/2003  Job:  [id number]

## 2010-10-18 NOTE — Assessment & Plan Note (Signed)
MEDICAL RECORD NUMBER:  [id number].   [name] is here in followup of his post laminectomy syndrome and cervical pain  in the context of myofascial pain and postural abnormalities.  I last saw  [name] back in April.  He has had some increased pain in the thoracic spine  since mid July.  He does not recall any specific accident or problem that  took place.  He continues with the fentanyl patches which seem to be helpful  for the first two days and then he has problems afterwards.  He has had  problems with adhesion with the generic patches.  His pain rates at a level  of 3-7/10.  On average it is about a 6/10.  The pain is most prominent in  the mid back, as well as the cervical region in between his shoulder blades.  He also complains of some knee pain.  The pain generally improves with rest,  heat and medication, but worsens with walking, bending, sitting, working and  sometimes with the weather.  He is using OxyContin 20 mg q.12h. in addition  to the patch and IR oxycodone 5 mg one q.8h. p.r.n.  The Restoril seems to  have helped at nighttime for sleep at 30 mg q.h.s. and the Flexeril has  helped with muscle spasms 10 mg t.i.d.   REVIEW OF SYSTEMS:  The patient denies any chest pain, shortness of breath,  cold, flu, wheezing or coughing symptoms.  Denies seizures, but reports some  weakness and spasms.  Denies blurred vision, anxiety, problems with sleep,  suicidal thoughts or agitation.  Denies nausea, vomiting, reflux, urinary  frequency or abdominal pain.  He does report constipation.  Denies weight  changes, bleeding, fever, chills, skin breakdown or bruising.   PHYSICAL EXAMINATION:  On physical examination today, the blood pressure is  105/69, pulse 82, respiratory rate 16 and he is saturating 99% on room air.  He walks with a limp and posture remains poor with kyphosis essentially  noted in the mid thoracic to lower cervical spine.  He walks with his  shoulders rounded.  He is  generally alert.  His appearance is disheveled  today.   On examination of his back, there is significant spasm of the paraspinals in  the thoracic region between T7 and L1.  They are painful with palpation.  He  can improve his posture with cueing, but still meets resistance when doing  so.  No obvious curvature to the left or right noted on exam today.  Motor  exam was 5/5 in both upper and lower extremities.  Reflexes are 2+ and the  sensory exam remains nonfocal in all four extremities.  Lumbar flexion is 40  degrees, extension 10 degrees and neck movement is 20-40 degrees in planes.   ASSESSMENT:  1. Post laminectomy syndrome.  40981  2. Myofascial pain.  7239  3. Bicipital tendonitis, left shoulder.  19147  4. Fibromyalgia.  7291   PLAN:  1. We injected four trigger points today in the thoracic back, each with 2     ml of 1% lidocaine.  The patient tolerated these well.  2. Will continue with fentanyl patch 100 mcg q.72h.  We will switch him to     Duragesic to see if he has better adhesion, as well as pain relief.  Will     keep his OxyContin at 20 mg q.12h., as well as oxycodone 5 mg q.8h.     p.r.n.  3. I refilled his Flexeril  today 10 mg q.8h. p.r.n., as well as Zestril 30     mg q.h.s.  4. I will send him to [facility] for a hyperextension orthosis to     work on his posture and give him some     pain relief.  I asked him to wear this during the daytime as tolerated.     He may doff this at night.  5. I will see the patient back in about a month's time.      Ranelle Oyster, M.D.   ZTS/MedQ  D:  01/19/2004 17:10:12  T:  01/20/2004 22:10:31  Job #:  [id number]   cc:   Loreen Freud, M.D.
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
## 2010-10-21 ENCOUNTER — Emergency Department (HOSPITAL_COMMUNITY)
Admission: EM | Admit: 2010-10-21 | Discharge: 2010-10-22 | Disposition: A | Discharge status: Other Acute Inpatient Hospital | Payer: 59 | Attending: Emergency Medicine | Admitting: Emergency Medicine

## 2010-10-21 DIAGNOSIS — E039 Hypothyroidism, unspecified: Secondary | ICD-10-CM | POA: Insufficient documentation

## 2010-10-21 DIAGNOSIS — K219 Gastro-esophageal reflux disease without esophagitis: Secondary | ICD-10-CM | POA: Insufficient documentation

## 2010-10-21 DIAGNOSIS — E785 Hyperlipidemia, unspecified: Secondary | ICD-10-CM | POA: Insufficient documentation

## 2010-10-21 DIAGNOSIS — F411 Generalized anxiety disorder: Secondary | ICD-10-CM | POA: Insufficient documentation

## 2010-10-21 DIAGNOSIS — Z79899 Other long term (current) drug therapy: Secondary | ICD-10-CM | POA: Insufficient documentation

## 2010-10-21 DIAGNOSIS — H5316 Psychophysical visual disturbances: Secondary | ICD-10-CM | POA: Insufficient documentation

## 2010-10-21 LAB — DIFFERENTIAL
Basophils Absolute: 0.1 10*3/uL (ref 0.0–0.1)
Lymphocytes Relative: 31 % (ref 12–46)
Lymphs Abs: 2.7 10*3/uL (ref 0.7–4.0)
Neutrophils Relative %: 56 % (ref 43–77)

## 2010-10-21 LAB — COMPREHENSIVE METABOLIC PANEL
AST: 19 U/L (ref 0–37)
BUN: 16 mg/dL (ref 6–23)
CO2: 25 mEq/L (ref 19–32)
Calcium: 9.4 mg/dL (ref 8.4–10.5)
Chloride: 104 mEq/L (ref 96–112)
Creatinine, Ser: 0.89 mg/dL (ref 0.4–1.5)
GFR calc Af Amer: >60 mL/min (ref >60)
GFR calc non Af Amer: >60 mL/min (ref >60)
Total Bilirubin: 0.1 mg/dL — ABNORMAL LOW (ref 0.3–1.2)

## 2010-10-21 LAB — RAPID URINE DRUG SCREEN, HOSP PERFORMED
Benzodiazepines: NOT DETECTED
Cocaine: NOT DETECTED
Opiates: NOT DETECTED
Tetrahydrocannabinol: NOT DETECTED

## 2010-10-21 LAB — CBC
HCT: 41 % (ref 39.0–52.0)
MCV: 91.5 fL (ref 78.0–100.0)
Platelets: 274 10*3/uL (ref 150–400)
RBC: 4.48 MIL/uL (ref 4.22–5.81)
WBC: 8.7 10*3/uL (ref 4.0–10.5)

## 2010-10-22 ENCOUNTER — Inpatient Hospital Stay (HOSPITAL_COMMUNITY)
Admission: AD | Admit: 2010-10-22 | Discharge: 2010-10-28 | DRG: 885 | Disposition: A | Discharge status: Home / Self Care | Payer: 59 | Source: Ambulatory Visit | Attending: Psychiatry | Admitting: Psychiatry

## 2010-10-22 DIAGNOSIS — F29 Unspecified psychosis not due to a substance or known physiological condition: Secondary | ICD-10-CM

## 2010-10-22 DIAGNOSIS — K589 Irritable bowel syndrome without diarrhea: Secondary | ICD-10-CM

## 2010-10-22 DIAGNOSIS — E039 Hypothyroidism, unspecified: Secondary | ICD-10-CM

## 2010-10-22 DIAGNOSIS — F411 Generalized anxiety disorder: Secondary | ICD-10-CM

## 2010-10-22 DIAGNOSIS — F319 Bipolar disorder, unspecified: Principal | ICD-10-CM

## 2010-10-22 DIAGNOSIS — E785 Hyperlipidemia, unspecified: Secondary | ICD-10-CM

## 2010-10-22 DIAGNOSIS — G8929 Other chronic pain: Secondary | ICD-10-CM

## 2010-10-22 DIAGNOSIS — F607 Dependent personality disorder: Secondary | ICD-10-CM

## 2010-10-22 DIAGNOSIS — Z6379 Other stressful life events affecting family and household: Secondary | ICD-10-CM

## 2010-10-22 DIAGNOSIS — K219 Gastro-esophageal reflux disease without esophagitis: Secondary | ICD-10-CM

## 2010-10-22 DIAGNOSIS — M549 Dorsalgia, unspecified: Secondary | ICD-10-CM

## 2010-10-22 DIAGNOSIS — Z981 Arthrodesis status: Secondary | ICD-10-CM

## 2010-10-22 DIAGNOSIS — Z7982 Long term (current) use of aspirin: Secondary | ICD-10-CM

## 2010-10-22 NOTE — Consult Note (Signed)
  [name], [name]                ACCOUNT NO.:  [id number]  MEDICAL RECORD NO.:  [id number]           PATIENT TYPE:  E  LOCATION:  WLED                         FACILITY:  [facility]  PHYSICIAN:  Eulogio Ditch, MD DATE OF BIRTH:  1965/11/28  DATE OF CONSULTATION:  10/22/2010 DATE OF DISCHARGE:                                CONSULTATION   REASON FOR CONSULTATION:  Hearing voices.  HISTORY OF PRESENT ILLNESS:  The patient is a 45 year old male who reported hearing voices but reported that the voices are not telling him to kill self or others.  He reported that he is just taking Risperdal 2 mg once a day.  I told the patient that when he was discharged in December 2010, he was on Risperdal 2 mg 3 times a day but the patient reported that when he followed outside in [place] his medicine was reduced to 2 mg.  The patient denies any suicidal or homicidal ideations.  The patient reported that now he is going to live with his wife in [place].  SUBSTANCE ABUSE HISTORY:  The patient denies abusing any drugs.  PHYSICAL EXAMINATION:  Within normal limits.  MENTAL STATUS EXAMINATION:  The patient is calm, cooperative during the interview.  Fair eye contact.  No abnormal movements noticed.  Mood euthymic.  Affect mood congruent.  The patient is logical and goal directed during the interview.  No circumstantiality, tangentiality or thought blocking present during the interview, not delusional.  Reported hearing voices, noncommand type.  Cognition, alert, awake, oriented x3. Memory, immediate, recent remote fair.  Attention and concentration fair.  Abstraction ability good.  Insight and judgment intact.  DIAGNOSIS:  AXIS I:  Psychosis, not otherwise specified. AXIS II:  Deferred. AXIS III:  Gastroesophageal reflux disease, hyperlipidemia, hypothyroidism, acne. AXIS IV:  Psychosocial stressors. AXIS V:  40.  RECOMMENDATIONS:  The patient will be admitted to [facility]  for further stabilization.  The patient wants to be admitted for medication stabilization.  His Risperdal will be increased to 4 mg at bedtime.     Eulogio Ditch, MD    SA/MEDQ  D:  10/22/2010  T:  10/22/2010  Job:  [id number]  Electronically Signed by Eulogio Ditch  on 10/22/2010 07:30:34 PM

## 2010-10-23 DIAGNOSIS — F259 Schizoaffective disorder, unspecified: Secondary | ICD-10-CM

## 2010-10-23 LAB — VALPROIC ACID LEVEL: Valproic Acid Lvl: 22.4 ug/mL — ABNORMAL LOW (ref 50.0–100.0)

## 2010-10-24 LAB — COMPREHENSIVE METABOLIC PANEL
BUN: 23 mg/dL (ref 6–23)
CO2: 25 mEq/L (ref 19–32)
Calcium: 8.9 mg/dL (ref 8.4–10.5)
Creatinine, Ser: 0.95 mg/dL (ref 0.4–1.5)
GFR calc non Af Amer: >60 mL/min (ref >60)
Glucose, Bld: 86 mg/dL (ref 70–99)

## 2010-10-24 LAB — T3, FREE: T3, Free: 2.7 pg/mL (ref 2.3–4.2)

## 2010-10-24 LAB — DIFFERENTIAL
Basophils Relative: 1 % (ref 0–1)
Eosinophils Relative: 2 % (ref 0–5)
Lymphocytes Relative: 36 % (ref 12–46)
Monocytes Absolute: 0.9 10*3/uL (ref 0.1–1.0)
Monocytes Relative: 11 % (ref 3–12)
Neutro Abs: 4.1 10*3/uL (ref 1.7–7.7)

## 2010-10-24 LAB — T4, FREE: Free T4: 1.07 ng/dL (ref 0.80–1.80)

## 2010-10-24 LAB — CBC
HCT: 41.8 % (ref 39.0–52.0)
Hemoglobin: 14.3 g/dL (ref 13.0–17.0)
MCH: 31.2 pg (ref 26.0–34.0)
MCHC: 34.2 g/dL (ref 30.0–36.0)

## 2010-10-25 LAB — CARBAMAZEPINE LEVEL, TOTAL: Carbamazepine Lvl: 3.8 ug/mL — ABNORMAL LOW (ref 4.0–12.0)

## 2010-11-01 NOTE — Discharge Summary (Signed)
[name], [name]                ACCOUNT NO.:  [id number]  MEDICAL RECORD NO.:  [id number]           PATIENT TYPE:  I  LOCATION:  0505                          [facility]ILITY[facility]  PHYSICIAN:  Franchot Gallo, MD     DATE OF BIRTH:  [birth year]  DATE OF ADMISSION:  10/22/2010 DATE OF DISCHARGE:  10/28/2010                              DISCHARGE SUMMARY   REASON FOR ADMISSION:  This is a 45 year old male who was admitted with auditory hallucinations.  He was denying any suicidal or homicidal thoughts.  He was having problems with sleep.  FINAL IMPRESSION:  Axis I:  Bipolar disorder, psychotic features. Social anxiety disorder. AXIS II:  Rule out dependent personality issues. AXIS III:  Status post ACF and a history of irritable bowel syndrome and GERD. AXIS IV:  Severe.  Problems with primary support group, housing, economic issues, health and mental health. AXIS V:  50 to 55.  PERTINENT LABORATORIES:  CBC was within normal limits.  Alcohol level less than 11.  Urine drug screen was negative.  PERTINENT FINDINGS:  The patient was admitted to the adult milieu, to the mood disorder group.  We increased his Risperdal to 4 mg at bedtime to address his auditory hallucinations.  We checked his thyroid and Depakote levels.  The patient was intending to return home to live with his wife and was able to afford his medications.  He denied any command hallucinations. His sleep was improving.  His appetite was good.  He was having moderate depressive symptoms but no manic or hypomanic symptoms.  He was reporting some side effects to his Depakote and that was discontinued. We added Tegretol 200 mg b.i.d. and Klonopin every 8 hours for anxiety, Cogentin for symptoms of EPS and increased his Neurontin for anxiety. He was reporting that his auditory hallucinations were resolving. Denied any visual hallucinations or delusional thinking.  His anxiety was much improved, having no medication side  effects.  We added Wellbutrin for his depressive symptoms.  The patient was having some respiratory symptoms, and we ordered nasal saline, Cepacol for sore throat and ordered a CBC.  He was active in group and reported feeling better.  His mood was improving.  The patient's sleep was good, appetite was good.  Having mild depressive symptoms, rating it at 3 on a scale of 1-10.  His anxiety was under good control.  He was reporting some arm pain bilaterally.  He did discuss symptoms of OCD that were related to starting nonaddictive pills.  We ordered some Naprosyn for complaints of pain.  On the day of discharge, the patient's sleep was good, appetite was good, his depression was under good control, rating it a 1 on a scale of 1 to 10.  Denied any suicidal or homicidal thoughts or auditory hallucinations.  He felt that the voices were totally resolved, anxiety was under good control, pain was under good control.  He was stable for discharge.  DISCHARGE MEDICATIONS: 1. Cogentin 0.5 mg 1 b.i.d. 2. Wellbutrin 150 mg daily. 3. Tegretol 200 mg 1 b.i.d. 4. Klonopin 0.5 mg 1 t.i.d. 5. Gabapentin 300  mg 1 t.i.d. 6. Risperdal 2 mg, taking 2 tablets q.h.s. 7. Aspirin enteric-coated 81 mg daily. 8. Doxycycline 100 mg b.i.d. 9. Fish oil daily. 10.Flomax 0.4 daily. 11.Levothyroxine 75 mcg daily. 12.Nexium 40 mg daily. 13.Paxil 40 mg daily. 14.Phenergan 25 mg 1/2 tablet q.6 h p.r.n. nausea. 15.Pravastatin 40 mg daily. 16.Zanaflex 6 mg 1 t.i.d. p.r.n. for spasms. 17.Trazodone 100 mg q.h.s. p.r.n. sleep.  The patient was to stop taking his Depakote.  His followup appointment was with [facility] in [place].  The patient was to call for appointment at phone number [phone].     Landry Corporal, N.P.   ______________________________ Franchot Gallo, MD    JO/MEDQ  D:  10/30/2010  T:  10/30/2010  Job:  [id number]  Electronically Signed by Limmie PatriciaP. on 10/31/2010 09:32:46  AM Electronically Signed by Franchot Gallo MD on 11/01/2010 09:59:21 PM
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
## 2011-01-27 ENCOUNTER — Emergency Department (HOSPITAL_COMMUNITY): Payer: 59

## 2011-01-27 ENCOUNTER — Encounter (HOSPITAL_COMMUNITY): Payer: Self-pay | Admitting: Radiology

## 2011-01-27 ENCOUNTER — Inpatient Hospital Stay (HOSPITAL_COMMUNITY)
Admission: EM | Admit: 2011-01-27 | Discharge: 2011-01-30 | DRG: 312 | Disposition: A | Discharge status: Home / Self Care | Payer: 59 | Source: Ambulatory Visit | Attending: Internal Medicine | Admitting: Internal Medicine

## 2011-01-27 DIAGNOSIS — E039 Hypothyroidism, unspecified: Secondary | ICD-10-CM | POA: Diagnosis present

## 2011-01-27 DIAGNOSIS — N400 Enlarged prostate without lower urinary tract symptoms: Secondary | ICD-10-CM | POA: Diagnosis present

## 2011-01-27 DIAGNOSIS — IMO0002 Reserved for concepts with insufficient information to code with codable children: Secondary | ICD-10-CM | POA: Diagnosis present

## 2011-01-27 DIAGNOSIS — F319 Bipolar disorder, unspecified: Secondary | ICD-10-CM | POA: Diagnosis present

## 2011-01-27 DIAGNOSIS — T448X5A Adverse effect of centrally-acting and adrenergic-neuron-blocking agents, initial encounter: Secondary | ICD-10-CM | POA: Diagnosis present

## 2011-01-27 DIAGNOSIS — F411 Generalized anxiety disorder: Secondary | ICD-10-CM | POA: Diagnosis present

## 2011-01-27 DIAGNOSIS — Z79899 Other long term (current) drug therapy: Secondary | ICD-10-CM

## 2011-01-27 DIAGNOSIS — R1310 Dysphagia, unspecified: Secondary | ICD-10-CM | POA: Diagnosis present

## 2011-01-27 DIAGNOSIS — I951 Orthostatic hypotension: Principal | ICD-10-CM | POA: Diagnosis present

## 2011-01-27 DIAGNOSIS — S066X9A Traumatic subarachnoid hemorrhage with loss of consciousness of unspecified duration, initial encounter: Secondary | ICD-10-CM | POA: Diagnosis present

## 2011-01-27 DIAGNOSIS — Z9181 History of falling: Secondary | ICD-10-CM

## 2011-01-27 DIAGNOSIS — E785 Hyperlipidemia, unspecified: Secondary | ICD-10-CM | POA: Diagnosis present

## 2011-01-27 DIAGNOSIS — K589 Irritable bowel syndrome without diarrhea: Secondary | ICD-10-CM | POA: Diagnosis present

## 2011-01-27 DIAGNOSIS — Z7982 Long term (current) use of aspirin: Secondary | ICD-10-CM

## 2011-01-27 DIAGNOSIS — Z981 Arthrodesis status: Secondary | ICD-10-CM

## 2011-01-27 DIAGNOSIS — I498 Other specified cardiac arrhythmias: Secondary | ICD-10-CM | POA: Diagnosis present

## 2011-01-27 DIAGNOSIS — F17200 Nicotine dependence, unspecified, uncomplicated: Secondary | ICD-10-CM | POA: Diagnosis present

## 2011-01-27 DIAGNOSIS — W1809XA Striking against other object with subsequent fall, initial encounter: Secondary | ICD-10-CM | POA: Diagnosis present

## 2011-01-27 HISTORY — DX: Unspecified injury of head, initial encounter: S09.90XA

## 2011-01-27 HISTORY — DX: Unspecified osteoarthritis, unspecified site: M19.90

## 2011-01-27 HISTORY — DX: Hyperlipidemia, unspecified: E78.5

## 2011-01-27 HISTORY — DX: Gastro-esophageal reflux disease without esophagitis: K21.9

## 2011-01-27 HISTORY — DX: Other chronic pain: G89.29

## 2011-01-27 HISTORY — DX: Irritable bowel syndrome, unspecified: K58.9

## 2011-01-27 HISTORY — DX: Bipolar disorder, unspecified: F31.9

## 2011-01-27 HISTORY — DX: Reserved for concepts with insufficient information to code with codable children: IMO0002

## 2011-01-27 HISTORY — DX: Anxiety disorder, unspecified: F41.9

## 2011-01-27 HISTORY — DX: Dorsalgia, unspecified: M54.9

## 2011-01-27 LAB — URINALYSIS, ROUTINE W REFLEX MICROSCOPIC
Bilirubin Urine: NEGATIVE
Ketones, ur: NEGATIVE mg/dL
Nitrite: NEGATIVE
Specific Gravity, Urine: 1.011 (ref 1.005–1.030)
Urobilinogen, UA: 0.2 mg/dL (ref 0.0–1.0)

## 2011-01-27 LAB — BASIC METABOLIC PANEL
Chloride: 103 mEq/L (ref 96–112)
Creatinine, Ser: 1.47 mg/dL — ABNORMAL HIGH (ref 0.50–1.35)
GFR calc Af Amer: >60 mL/min (ref >60)

## 2011-01-27 LAB — CBC
HCT: 39.8 % (ref 39.0–52.0)
Hemoglobin: 14.7 g/dL (ref 13.0–17.0)
RDW: 11.8 % (ref 11.5–15.5)
WBC: 12.1 10*3/uL — ABNORMAL HIGH (ref 4.0–10.5)

## 2011-01-27 LAB — DIFFERENTIAL
Basophils Absolute: 0.1 10*3/uL (ref 0.0–0.1)
Eosinophils Relative: 3 % (ref 0–5)
Lymphocytes Relative: 16 % (ref 12–46)
Neutro Abs: 8.6 10*3/uL — ABNORMAL HIGH (ref 1.7–7.7)
Neutrophils Relative %: 72 % (ref 43–77)

## 2011-01-27 LAB — POCT I-STAT TROPONIN I

## 2011-01-27 LAB — RAPID URINE DRUG SCREEN, HOSP PERFORMED
Barbiturates: NOT DETECTED
Cocaine: NOT DETECTED

## 2011-01-27 LAB — CARBAMAZEPINE LEVEL, TOTAL: Carbamazepine Lvl: 6.4 ug/mL (ref 4.0–12.0)

## 2011-01-27 LAB — ETHANOL: Alcohol, Ethyl (B): <11 mg/dL (ref 0–11)

## 2011-01-28 ENCOUNTER — Inpatient Hospital Stay (HOSPITAL_COMMUNITY): Payer: 59

## 2011-01-28 ENCOUNTER — Encounter (HOSPITAL_COMMUNITY): Payer: Self-pay | Admitting: Radiology

## 2011-01-28 LAB — CBC
MCH: 31.4 pg (ref 26.0–34.0)
MCV: 87.6 fL (ref 78.0–100.0)
Platelets: 239 10*3/uL (ref 150–400)
RDW: 11.8 % (ref 11.5–15.5)

## 2011-01-28 LAB — CARDIAC PANEL(CRET KIN+CKTOT+MB+TROPI)
Relative Index: INVALID (ref 0.0–2.5)
Total CK: 69 U/L (ref 7–232)

## 2011-01-28 LAB — COMPREHENSIVE METABOLIC PANEL
AST: 14 U/L (ref 0–37)
Albumin: 3.3 g/dL — ABNORMAL LOW (ref 3.5–5.2)
BUN: 11 mg/dL (ref 6–23)
CO2: 26 mEq/L (ref 19–32)
Calcium: 8.8 mg/dL (ref 8.4–10.5)
Creatinine, Ser: 0.99 mg/dL (ref 0.50–1.35)
GFR calc non Af Amer: >60 mL/min (ref >60)

## 2011-01-28 LAB — MAGNESIUM: Magnesium: 2 mg/dL (ref 1.5–2.5)

## 2011-01-28 LAB — TSH: TSH: 3.28 u[IU]/mL (ref 0.350–4.500)

## 2011-01-28 LAB — DIFFERENTIAL
Eosinophils Absolute: 0.5 10*3/uL (ref 0.0–0.7)
Eosinophils Relative: 5 % (ref 0–5)
Lymphs Abs: 2.9 10*3/uL (ref 0.7–4.0)
Monocytes Absolute: 0.7 10*3/uL (ref 0.1–1.0)
Monocytes Relative: 8 % (ref 3–12)

## 2011-01-28 LAB — CK TOTAL AND CKMB (NOT AT ARMC)
CK, MB: 1.7 ng/mL (ref 0.3–4.0)
Relative Index: INVALID (ref 0.0–2.5)
Total CK: 64 U/L (ref 7–232)

## 2011-01-28 LAB — LACTIC ACID, PLASMA: Lactic Acid, Venous: 0.5 mmol/L (ref 0.5–2.2)

## 2011-01-28 LAB — LIPID PANEL
Total CHOL/HDL Ratio: 3 RATIO
VLDL: 23 mg/dL (ref 0–40)

## 2011-01-28 LAB — PROCALCITONIN: Procalcitonin: <0.1 ng/mL

## 2011-01-28 NOTE — Consult Note (Signed)
  [name], [name]                ACCOUNT NO.:  [id number]  MEDICAL RECORD NO.:  [id number]  LOCATION:  3107                         FACILITY:  [facility]  PHYSICIAN:  Tia Alert, MD     DATE OF BIRTH:  Oct 14, 1965  DATE OF CONSULTATION: DATE OF DISCHARGE:                                CONSULTATION   ADDENDUM:  PHYSICAL EXAMINATION:  GENERAL:  Well-nourished, well-developed white male lying in a stretcher. HEENT:  Small abrasion to the posterior occipital region, otherwise, no evidence of external trauma.  Extraocular movements are intact.  Pupils are equal, round and reactive.  Oropharynx is benign. NECK:  Supple and nontender. HEART:  Regular rhythm. EXTREMITIES:  No obvious deformities except for a small swelling in the distal interphalangeal joint of the fourth digit on the right. NEUROLOGIC:  He is awake and alert, interactive.  No aphasia.  Good attention span.  His fund of knowledge and memory are appropriate.  No facial asymmetry.  Tongue protrudes in midline.  He has good shoulder shrug.  His strength appears to be 5/5.  He has no pronator drift. Sensation is grossly intact.  Gait is not tested.  No long track signs. No clonus.  No Hoffman sign.     Tia Alert, MD     DSJ/MEDQ  D:  01/28/2011  T:  01/28/2011  Job:  [id number]  Electronically Signed by Marikay Alar MD on 01/28/2011 02:26:14 PM

## 2011-01-28 NOTE — Consult Note (Signed)
  [name], [name]                ACCOUNT NO.:  [id number]  MEDICAL RECORD NO.:  [id number]  LOCATION:  3107                         FACILITY:  [facility]  PHYSICIAN:  Tia Alert, MD     DATE OF BIRTH:  1966-05-25  DATE OF CONSULTATION:  01/28/2011 DATE OF DISCHARGE:                                CONSULTATION   CHIEF COMPLAINT:  Closed head injury.  HISTORY OF PRESENT ILLNESS:  [name] is a 45 year old gentleman who is status post ACDF with plating at C4-5 and C5-6 by Dr. Donalee Citrin, multiple back surgeries including spinal cord stimulator placement by Dr. Gerlene Fee, who reportedly over the last week has had multiple falls. He denies dizziness, but states that his knees will give way and he falls, he struck the back of his head.  He reports positive loss of consciousness.  No seizure activity.  He denies any numbness, tingling or weakness in the extremities at this point.  He complains of his chronic pain.  He does have some headache.  No nausea or vomiting.  No double vision or visual changes.  PAST MEDICAL HISTORY: 1. ACDF with plating. 2. Lumbar surgery. 3. Spinal cord stimulator. 4. Bipolar disorder. 5. Anxiety. 6. Degenerative disease. 7. Hypotension. 8. Dyslipidemia. 9. Hypothyroidism.  MEDICATIONS:  Trazodone, Zanaflex, risperidone, Crestor, Phenergan, Paxil, Nexium, levothyroxine, Flomax, gabapentin, fish oil, doxycycline, clonazepam, Tegretol, Wellbutrin, Cogentin, and aspirin.  ALLERGIES:  DEMEROL.  SOCIAL HISTORY:  He does use tobacco products.  Denies alcohol use.  FAMILY HISTORY:  Coronary artery disease.  REVIEW OF SYSTEMS:  As above, although he reports multiple falls over the last week or so.  IMAGING STUDIES:  CT scan of the head last night shows a small amount of hyperdensity around the interhemispheric fissure consistent with some traumatic subarachnoid blood.  This does not look aneurysmal.  They follow a CT this morning shows that this blood  is much less conspicuous. There is no shift mass effect, edema, no subdural hematoma, or epidural hematoma.  CT scan of the cervical spine shows the previous fusion from C4-C6.  He has low subluxation of C2 and C3, but I do not see significant canal stenosis.  There is some spondylosis at C6-7.  ASSESSMENT AND PLAN:  This is a 45 year old gentleman with a closed head injury after a fall.  His head CT today looks quite good and I do not think he needs any further neurosurgical workup or treatment at this point.  We certainly figure out the cause of his recent falls that is being worked up by the hospitalist.  Certainly, we will be available for any questions.  Dr. Wynetta Emery and Dr. Gerlene Fee obviously know this patient quite well.     Tia Alert, MD     DSJ/MEDQ  D:  01/28/2011  T:  01/28/2011  Job:  [id number]  Electronically Signed by Marikay Alar MD on 01/28/2011 02:26:11 PM

## 2011-01-29 ENCOUNTER — Inpatient Hospital Stay (HOSPITAL_COMMUNITY): Payer: 59

## 2011-01-29 LAB — CBC
HCT: 37.6 % — ABNORMAL LOW (ref 39.0–52.0)
Hemoglobin: 13.5 g/dL (ref 13.0–17.0)
MCH: 31.3 pg (ref 26.0–34.0)
MCHC: 35.9 g/dL (ref 30.0–36.0)
RBC: 4.32 MIL/uL (ref 4.22–5.81)

## 2011-01-29 LAB — BASIC METABOLIC PANEL
BUN: 11 mg/dL (ref 6–23)
CO2: 25 mEq/L (ref 19–32)
GFR calc non Af Amer: >60 mL/min (ref >60)
Glucose, Bld: 87 mg/dL (ref 70–99)
Potassium: 3.6 mEq/L (ref 3.5–5.1)

## 2011-01-29 LAB — ACTH STIMULATION, 3 TIME POINTS: Cortisol, 60 Min: 28.9 ug/dL (ref >20)

## 2011-01-30 LAB — BASIC METABOLIC PANEL
CO2: 25 mEq/L (ref 19–32)
Chloride: 106 mEq/L (ref 96–112)
Glucose, Bld: 101 mg/dL — ABNORMAL HIGH (ref 70–99)
Potassium: 3.4 mEq/L — ABNORMAL LOW (ref 3.5–5.1)
Sodium: 139 mEq/L (ref 135–145)

## 2011-01-30 LAB — CBC
Hemoglobin: 13.4 g/dL (ref 13.0–17.0)
MCH: 31.4 pg (ref 26.0–34.0)
Platelets: 234 10*3/uL (ref 150–400)
RBC: 4.27 MIL/uL (ref 4.22–5.81)
WBC: 9.7 10*3/uL (ref 4.0–10.5)

## 2011-02-04 NOTE — H&P (Signed)
[name], CARBONNEAU                ACCOUNT NO.:  [id number]  MEDICAL RECORD NO.:  [id number]  LOCATION:  [facility]                         FACILITY:  [facility]  PHYSICIAN:  Eduard Clos, MDDATE OF BIRTH:  1966/04/12  DATE OF ADMISSION:  01/27/2011 DATE OF DISCHARGE:                             HISTORY & PHYSICAL   PRIMARY CARE PHYSICIAN:  Dr. Clovis Riley at [facility].  CHIEF COMPLAINT:  Loss of consciousness.  HISTORY OF PRESENTING ILLNESS:  A 45 year old male with history of bipolar disorder, anxiety, BPH, hypothyroidism, hyperlipidemia, had a fall around 4:00 p.m.  The patient stated he was doing fine.  He was planning to go to shopping with his wife when he stood up and was about to walk when he suddenly fell without his own knowledge and lost his consciousness which was witnessed by his wife.  He states he may have lost his consciousness for around 2-3 minutes.  Did not have any tongue bite or any incontinence of urine.  Did not have any seizure-like activity.  After his fall, he had a persistent headache.  He was brought to the ER.  In the ER, the patient had a CT head without contrast which was showing subarachnoid hemorrhage and subdural hemorrhage.  Dr. Yetta Barre, neurosurgeon on call, was contacted by Dr. Effie Shy, ER physician.  Dr. Yetta Barre advised hospital admission for further observation, repeat CT head in the morning, and Dr. Yetta Barre will be seeing the patient in consult.  While in ER, the patient was found to have low blood pressure around 90s in the systolic, and when the patient stood up, it dropped to 50s.  The patient was orthostatic and also mildly bradycardic.  The patient was given a liter of fluid bolus, and at this time, normal saline is going at 250 mL per hour.  His blood pressure is around 110 systolic at this time with heart rate around 50-60 per minute.  The patient denies any nausea, vomiting, abdominal pain, dysuria, discharges, diarrhea.  Denies any focal  deficit.  Denies any visual symptoms.  Denies any difficulty speaking, though over the last 2 days, he developed some difficulty swallowing.  He said he felt like choking sensation.  At this time, the patient also has persistent neck pain which he says it is chronic but increased after his fall.  He did not have any urinary incontinence or bowel incontinence.  The patient stated that over the last 3 or 4 days he is feeling dizzy and he has near-syncopal episode at least 3 or 4 times a day but did not lose consciousness or did not hit his head.  PAST MEDICAL HISTORY: 1. History of bipolar disorder. 2. History of anxiety disorder. 3. History of chronic back pain with no stimulator placement. 4. History of degenerative disk disease. 5. History of laminectomy for cervical spine. 6. History of closed head injury. 7. History of hyperlipidemia. 8. History of hypothyroidism. 9. History of irritable bowel syndrome.  MEDICATIONS PRIOR TO ADMISSION:  The patient is on: 1. Aspirin 81 mg p.o. daily. 2. Synthroid 75 mcg p.o. daily. 3. Bupropion 150 mg p.o. daily. 4. Doxycycline 100 mg p.o. b.i.d. 5. Clonazepam 0.5 mg t.i.d. 6. Benztropine  0.5 mg b.i.d. 7. Tizanidine 4 mg t.i.d. 8. Nexium 40 mg daily. 9. Gabapentin 300 mg t.i.d. 10.Fish oil 100 mg p.o. daily. 11.Milk thistle 1000 mg daily. 12.Saw palmetto. 13.Multivitamin fiber. 14.Multivitamin. 15.P.r.n. medications are Phenergan, Lomotil, and dicyclomine. 16.The patient states that he is on doxycycline for acne and he is on     Lomotil p.r.n., but he states he has not had any diarrhea recently.  ALLERGIES:  AMBIEN, DEMEROL, ZOLPIDEM.  The patient states that he can take Ativan and Valium with no problems.  SOCIAL HISTORY:  The patient smokes cigarettes.  Denies any alcohol or drug abuse.  He lives with his wife.  FAMILY HISTORY:  Father had MI and diabetes and had undergone CABG. Mother has rheumatoid arthritis, hypertension, MI,  and CHF.  One of his brothers has diabetes and hypertension.  REVIEW OF SYSTEMS:  As per history of presenting illness.  Nothing else significant.  PHYSICAL EXAMINATION:  GENERAL:  The patient examined at bedside, not in acute distress. VITAL SIGNS:  Blood pressure 109/68, pulse is 52 per minute; temperature 97.8, respirations 18 per minute, O2 sat is 97% on room air. HEENT: Anicteric.  No pallor.  No facial asymmetry.  Tongue is midline. No discharge from ears, eyes, nose, or mouth.  The patient is able to count in both eyes.  PERLA positive. NECK:  No neck rigidity.  I do not see any obvious scalp hematoma at this time. CHEST:  Bilateral air entry present.  No rhonchi.  No crepitation. HEART:  S1 and S2 heard. ABDOMEN:  Soft, nontender.  Bowel sounds heard. CNS:  The patient alert, awake, oriented to time, place, and person.  He is able to move upper and lower extremities 5/5.  There is no pronator drift.  No dysdiadochokinesia or ataxia. EXTREMITIES:  Peripheral pulses felt.  No acute ischemic changes, cyanosis, or clubbing.  LABORATORY DATA:  EKG shows normal sinus rhythm with incomplete right bundle-branch block, heart rate is around 63 beats per minute with nonspecific ST-T wave changes.  Chest x-ray shows no infiltrate, congestive heart failure, or pneumothorax.  CT head without contrast shows anterior, midinterhemispheric subarachnoid hemorrhage, tiny left-sided subdural hematoma versus thrombi in vessel.  CBC, WBCs 12.1, hemoglobin is 14.7, hematocrit is 39.8, platelets 278. Basic metabolic panel, sodium 137, potassium 4.2, chloride 103, carbon dioxide 25, glucose 89, BUN 12, creatinine 1.4, calcium 9.6, troponin 0.01.  Carbamazepine level is 6.4.  Urine drug screen is negative. Alcohol level less than 11.  UA is negative for nitrites and leukocytes.  ASSESSMENT: 1. Fall with mid interhemispheric subarachnoid hemorrhage with     possibility of tiny left-sided  subdural hematoma versus thrombi in     vessel. 2. Hypotension.  The patient is orthostatic.  Responding to fluid. 3. Sinus bradycardia. 4. History of bipolar disorder and anxiety disorder.  Presently, he     has no suicidal thoughts. 5. Chronic low-back pain with history of pain stimulator placement. 6. History of hypothyroidism. 7. History of hyperlipidemia. 8. Ongoing tobacco abuse. 9. History of benign prostatic hypertrophy. 10.Previous history of closed head injury.  PLAN: 1. At this time, admit the patient to stepdown unit. 2. For his subarachnoid hemorrhage with possibility of subdural     hematoma, I did discuss with on-call neurosurgeon, Dr. Yetta Barre, who     has advised to repeat CT head in a.m.  The patient will be seen by     Dr. Yetta Barre, neurosurgeon on call, in consult.  We will follow  his     recommendation.  As the patient does complain of pain in his neck     at this time, we are going to get a CT neck and C-spine as the     patient also complains of some difficulty swallowing as if     something is stuck in his throat.  We are going to get a formal     speech therapy evaluation for swallow after which his p.o. meds and     diet will be started. 3. Hypotension.  At this time, the patient's hypotension is     orthostatic and is responding to fluids.  I am going to check a     procalcitonin level and lactic acid level.  We will cycle cardiac     markers, get 2-D echo.  The patient does have mild leukocytosis,     but otherwise, he is afebrile.  He does not show any signs of     sepsis or any source for infection.  We are going to hydrate him     and his exact cause for his hypotension at this time is unclear.     He has stated that he has not been started on any new medications     recently.  He has been using all  his medicines at least for 3-4     months now.  We need to verify his home medication at this time and     he is on Flomax which we will stop. 4. Sinus  bradycardia.  We will closely observe in the step-down unit.     I am going to get TSH level.  If there is any significant     bradycardia, we may have to consult Cardiology. 5. Further recommendations as condition evolves.     Eduard Clos, MD     ANK/MEDQ  D:  01/27/2011  T:  01/27/2011  Job:  [id number]  Electronically Signed by Midge Minium MD on 02/04/2011 09:35:29 AM

## 2011-02-24 DIAGNOSIS — M79606 Pain in leg, unspecified: Secondary | ICD-10-CM | POA: Insufficient documentation

## 2011-02-24 DIAGNOSIS — M25551 Pain in right hip: Secondary | ICD-10-CM

## 2011-02-24 DIAGNOSIS — M542 Cervicalgia: Secondary | ICD-10-CM | POA: Insufficient documentation

## 2011-02-24 HISTORY — DX: Pain in right hip: M25.551

## 2011-02-24 HISTORY — DX: Cervicalgia: M54.2

## 2011-02-24 HISTORY — DX: Pain in leg, unspecified: M79.606

## 2011-12-13 ENCOUNTER — Encounter (HOSPITAL_COMMUNITY): Payer: Self-pay

## 2011-12-13 ENCOUNTER — Emergency Department (INDEPENDENT_AMBULATORY_CARE_PROVIDER_SITE_OTHER)
Admission: EM | Admit: 2011-12-13 | Discharge: 2011-12-13 | Disposition: A | Discharge status: Home / Self Care | Payer: 59 | Source: Home / Self Care | Attending: Emergency Medicine | Admitting: Emergency Medicine

## 2011-12-13 ENCOUNTER — Emergency Department (INDEPENDENT_AMBULATORY_CARE_PROVIDER_SITE_OTHER): Payer: 59

## 2011-12-13 DIAGNOSIS — M7750 Other enthesopathy of unspecified foot: Secondary | ICD-10-CM

## 2011-12-13 DIAGNOSIS — M659 Synovitis and tenosynovitis, unspecified: Secondary | ICD-10-CM

## 2011-12-13 NOTE — ED Provider Notes (Signed)
History     CSN: [id number]  Arrival date & time 12/13/11  1157   First MD Initiated Contact with Patient 12/13/11 1243      Chief Complaint  Patient presents with  . Foot Pain    (Consider location/radiation/quality/duration/timing/severity/associated sxs/prior treatment) HPI Comments: Patient was she was sleeping when he started the middle of the night having severe pain on the plantar surface of his right foot. Says when he walks it hurts in the same area. He is wondering if he was walking last night he stepped into something. He does not recall seeing any bleeding or seen anything on the floor that he could have stepped on it. Is still somewhat tender but not as much as in the early hours of the morning. No redness, no swelling, no bleeding, patient denies any numbness or tingling sensations.  Patient is a 46 y.o. male presenting with lower extremity pain. The history is provided by the patient.  Foot Pain This is a new problem. The problem occurs constantly. The problem has not changed since onset.The symptoms are aggravated by walking. Nothing relieves the symptoms. He has tried nothing for the symptoms.    Past Medical History  Diagnosis Date  . Anxiety   . Bipolar affective disorder   . Chronic back pain   . DDD (degenerative disc disease)   . DJD (degenerative joint disease)   . GERD (gastroesophageal reflux disease)   . Head injury   . Hyperlipidemia   . Hypothyroid   . IBS (irritable bowel syndrome)     Past Surgical History  Procedure Date  . Back surgery   . Septoplasty     History reviewed. No pertinent family history.  History  Substance Use Topics  . Smoking status: Current Everyday Smoker  . Smokeless tobacco: Not on file  . Alcohol Use: No      Review of Systems  Constitutional: Negative for activity change and appetite change.  Musculoskeletal: Positive for arthralgias. Negative for myalgias.    Allergies  Meperidine hcl and Zolpidem  tartrate  Home Medications   Current Outpatient Rx  Name Route Sig Dispense Refill  . BENZTROPINE MESYLATE 1 MG PO TABS Oral Take 1 mg by mouth 2 (two) times daily.    Marland Kitchen CLONAZEPAM 1 MG PO TABS Oral Take 1 mg by mouth 2 (two) times daily as needed.    Marland Kitchen LEVOTHYROXINE SODIUM 137 MCG PO TABS Oral Take by mouth daily.    Marland Kitchen OXYMORPHONE HCL ER 20 MG PO TB12 Oral Take 20 mg by mouth every 12 (twelve) hours.    Marland Kitchen RISPERIDONE 3 MG PO TABS Oral Take 3 mg by mouth 2 (two) times daily.      BP 91/52  Pulse 82  Temp 98.2 F (36.8 C) (Oral)  Resp 18  SpO2 95%  Physical Exam  Vitals reviewed. Constitutional: He appears well-developed and well-nourished.  Musculoskeletal: He exhibits tenderness.       Right ankle: He exhibits normal range of motion, no swelling, no ecchymosis, no deformity, no laceration and normal pulse. tenderness. Achilles tendon normal.       Feet:  Neurological: He is alert.  Skin: No rash noted. No erythema.    ED Course  Procedures (including critical care time)  Labs Reviewed - No data to display Dg Foot Complete Right  12/13/2011  *RADIOLOGY REPORT*  Clinical Data: Right foot pain.  RIGHT FOOT COMPLETE - 3+ VIEW  Comparison: None.  Findings: No evidence of fracture or  dislocation. Tiny plantar calcaneal spur noted.  No other significant bone abnormality identified.  IMPRESSION: No acute findings.  Original Report Authenticated By: Danae Orleans, M.D.     1. Tendinitis of foot       MDM   right plantar surface nontraumatic sudden onset of pain. No abnormalities were seen on plantar surface as seen a puncture wound, localize infection or erythema or obvious deformity. Tenderness to palpation over her abdomen and fourth metatarsal regions dorsally. Patient was put on a postop shoe inserts to followup with his primary care Dr. if pain persisted. Patient already on a significant pain management regimen. Take Tylenol if needed.        Jimmie Molly,  MD 12/13/11 [phone]

## 2011-12-13 NOTE — ED Notes (Addendum)
Pt has rt foot pain that started today.  No known injury. Pt is walking with a cane and states he has back problems and always uses a cane to ambulate.

## 2012-12-25 ENCOUNTER — Observation Stay (HOSPITAL_COMMUNITY)
Admission: EM | Admit: 2012-12-25 | Discharge: 2012-12-26 | Disposition: A | Discharge status: Home / Self Care | Payer: 59 | Attending: Internal Medicine | Admitting: Internal Medicine

## 2012-12-25 ENCOUNTER — Emergency Department (HOSPITAL_COMMUNITY): Payer: 59

## 2012-12-25 ENCOUNTER — Encounter (HOSPITAL_COMMUNITY): Payer: Self-pay | Admitting: Nurse Practitioner

## 2012-12-25 DIAGNOSIS — K219 Gastro-esophageal reflux disease without esophagitis: Secondary | ICD-10-CM

## 2012-12-25 DIAGNOSIS — R0789 Other chest pain: Principal | ICD-10-CM | POA: Insufficient documentation

## 2012-12-25 DIAGNOSIS — G8929 Other chronic pain: Secondary | ICD-10-CM

## 2012-12-25 DIAGNOSIS — R079 Chest pain, unspecified: Secondary | ICD-10-CM

## 2012-12-25 DIAGNOSIS — K589 Irritable bowel syndrome without diarrhea: Secondary | ICD-10-CM

## 2012-12-25 DIAGNOSIS — F3289 Other specified depressive episodes: Secondary | ICD-10-CM

## 2012-12-25 DIAGNOSIS — F329 Major depressive disorder, single episode, unspecified: Secondary | ICD-10-CM

## 2012-12-25 DIAGNOSIS — E785 Hyperlipidemia, unspecified: Secondary | ICD-10-CM

## 2012-12-25 DIAGNOSIS — F3110 Bipolar disorder, current episode manic without psychotic features, unspecified: Secondary | ICD-10-CM | POA: Diagnosis present

## 2012-12-25 DIAGNOSIS — F411 Generalized anxiety disorder: Secondary | ICD-10-CM

## 2012-12-25 DIAGNOSIS — M549 Dorsalgia, unspecified: Secondary | ICD-10-CM | POA: Insufficient documentation

## 2012-12-25 DIAGNOSIS — F319 Bipolar disorder, unspecified: Secondary | ICD-10-CM

## 2012-12-25 DIAGNOSIS — Z79899 Other long term (current) drug therapy: Secondary | ICD-10-CM | POA: Insufficient documentation

## 2012-12-25 LAB — BASIC METABOLIC PANEL
CO2: 24 mEq/L (ref 19–32)
Calcium: 9.5 mg/dL (ref 8.4–10.5)
Chloride: 100 mEq/L (ref 96–112)
Creatinine, Ser: 1.07 mg/dL (ref 0.50–1.35)
Glucose, Bld: 78 mg/dL (ref 70–99)

## 2012-12-25 LAB — CBC
HCT: 39.2 % (ref 39.0–52.0)
Hemoglobin: 14.1 g/dL (ref 13.0–17.0)
MCH: 31 pg (ref 26.0–34.0)
MCV: 86.2 fL (ref 78.0–100.0)
RBC: 4.55 MIL/uL (ref 4.22–5.81)
WBC: 11.7 10*3/uL — ABNORMAL HIGH (ref 4.0–10.5)

## 2012-12-25 MED ORDER — SODIUM CHLORIDE 0.9 % IV SOLN
Freq: Once | INTRAVENOUS | Status: AC
  Administered 2012-12-25: 1000 mL via INTRAVENOUS

## 2012-12-25 MED ORDER — RISPERIDONE 3 MG PO TABS
6.0000 mg | ORAL_TABLET | Freq: Every day | ORAL | Status: DC
  Administered 2012-12-26: 6 mg via ORAL
  Filled 2012-12-25 (×2): qty 2

## 2012-12-25 MED ORDER — SIMVASTATIN 20 MG PO TABS
20.0000 mg | ORAL_TABLET | Freq: Every day | ORAL | Status: DC
  Administered 2012-12-25: 20 mg via ORAL
  Filled 2012-12-25 (×2): qty 1

## 2012-12-25 MED ORDER — ASPIRIN 81 MG PO CHEW
324.0000 mg | CHEWABLE_TABLET | Freq: Once | ORAL | Status: AC
  Administered 2012-12-25: 324 mg via ORAL
  Filled 2012-12-25: qty 4

## 2012-12-25 MED ORDER — NITROGLYCERIN 0.4 MG SL SUBL: 0.4000 mg | SUBLINGUAL_TABLET | SUBLINGUAL | Status: DC | PRN

## 2012-12-25 MED ORDER — LEVOTHYROXINE SODIUM 75 MCG PO TABS
75.0000 ug | ORAL_TABLET | Freq: Every evening | ORAL | Status: DC
  Administered 2012-12-25: 75 ug via ORAL
  Filled 2012-12-25 (×2): qty 1

## 2012-12-25 MED ORDER — MORPHINE SULFATE 2 MG/ML IJ SOLN
2.0000 mg | INTRAMUSCULAR | Status: DC | PRN
  Administered 2012-12-26 (×3): 2 mg via INTRAVENOUS
  Filled 2012-12-25 (×3): qty 1

## 2012-12-25 MED ORDER — BENZTROPINE MESYLATE 1 MG PO TABS
1.0000 mg | ORAL_TABLET | Freq: Every day | ORAL | Status: DC
  Administered 2012-12-26: 1 mg via ORAL
  Filled 2012-12-25 (×2): qty 1

## 2012-12-25 MED ORDER — BUPROPION HCL ER (XL) 300 MG PO TB24
300.0000 mg | ORAL_TABLET | Freq: Every evening | ORAL | Status: DC
  Filled 2012-12-25: qty 1

## 2012-12-25 MED ORDER — MORPHINE SULFATE 4 MG/ML IJ SOLN
4.0000 mg | INTRAMUSCULAR | Status: AC
  Administered 2012-12-25: 4 mg via INTRAVENOUS
  Filled 2012-12-25: qty 1

## 2012-12-25 MED ORDER — DULOXETINE HCL 60 MG PO CPEP
60.0000 mg | ORAL_CAPSULE | Freq: Once | ORAL | Status: AC
  Administered 2012-12-25: 60 mg via ORAL
  Filled 2012-12-25: qty 1

## 2012-12-25 MED ORDER — GI COCKTAIL ~~LOC~~
30.0000 mL | Freq: Once | ORAL | Status: AC
  Administered 2012-12-25: 30 mL via ORAL
  Filled 2012-12-25: qty 30

## 2012-12-25 MED ORDER — CLONAZEPAM 1 MG PO TABS
1.0000 mg | ORAL_TABLET | Freq: Three times a day (TID) | ORAL | Status: DC
  Administered 2012-12-25 – 2012-12-26 (×2): 1 mg via ORAL
  Filled 2012-12-25 (×2): qty 1

## 2012-12-25 MED ORDER — ASPIRIN EC 81 MG PO TBEC
81.0000 mg | DELAYED_RELEASE_TABLET | Freq: Every day | ORAL | Status: DC
  Administered 2012-12-26: 81 mg via ORAL
  Filled 2012-12-25: qty 1

## 2012-12-25 MED ORDER — MORPHINE SULFATE 4 MG/ML IJ SOLN
4.0000 mg | Freq: Once | INTRAMUSCULAR | Status: DC
  Filled 2012-12-25: qty 1

## 2012-12-25 MED ORDER — SIMVASTATIN 20 MG PO TABS: 20.0000 mg | ORAL_TABLET | Freq: Every day | ORAL | Status: DC

## 2012-12-25 MED ORDER — TRAZODONE HCL 100 MG PO TABS
200.0000 mg | ORAL_TABLET | Freq: Every day | ORAL | Status: DC
  Administered 2012-12-25: 200 mg via ORAL
  Filled 2012-12-25 (×2): qty 2

## 2012-12-25 MED ORDER — MORPHINE SULFATE 4 MG/ML IJ SOLN
4.0000 mg | Freq: Once | INTRAMUSCULAR | Status: AC
  Administered 2012-12-25: 4 mg via INTRAMUSCULAR

## 2012-12-25 MED ORDER — BUPROPION HCL ER (XL) 300 MG PO TB24
300.0000 mg | ORAL_TABLET | Freq: Once | ORAL | Status: AC
  Administered 2012-12-25: 300 mg via ORAL
  Filled 2012-12-25: qty 1

## 2012-12-25 MED ORDER — DULOXETINE HCL 60 MG PO CPEP
60.0000 mg | ORAL_CAPSULE | Freq: Every evening | ORAL | Status: DC
  Filled 2012-12-25: qty 1

## 2012-12-25 MED ORDER — PANTOPRAZOLE SODIUM 40 MG PO TBEC
40.0000 mg | DELAYED_RELEASE_TABLET | Freq: Every evening | ORAL | Status: DC
  Administered 2012-12-25: 40 mg via ORAL
  Filled 2012-12-25: qty 1

## 2012-12-25 MED ORDER — SODIUM CHLORIDE 0.9 % IJ SOLN: 3.0000 mL | Freq: Two times a day (BID) | INTRAMUSCULAR | Status: DC

## 2012-12-25 NOTE — ED Provider Notes (Signed)
CSN: [id number]     Arrival date & time 12/25/12  1655 History     First MD Initiated Contact with Patient 12/25/12 1716     Chief Complaint  Patient presents with  . Chest Pain   (Consider location/radiation/quality/duration/timing/severity/associated sxs/prior Treatment) Patient is a 47 y.o. male presenting with chest pain. The history is provided by the patient and medical records. No language interpreter was used.  Chest Pain Pain location:  Substernal area and L chest Pain quality: aching, pressure, radiating and tightness   Pain radiates to:  L arm Pain radiates to the back: no   Pain severity:  Severe Onset quality:  Sudden Duration:  3 minutes Timing:  Intermittent Progression:  Unchanged Chronicity:  Recurrent Context: at rest   Context: not breathing, no drug use, not eating, no intercourse, not lifting, no movement, not raising an arm, no stress and no trauma   Relieved by:  None tried Worsened by:  Nothing tried Associated symptoms: nausea   Associated symptoms: no abdominal pain, no AICD problem, no altered mental status, no anorexia, no anxiety, no back pain, no cough, no fever, no headache, no palpitations, no shortness of breath and not vomiting    patient presents emergency Department with chief complaint of chest pain.  He states that his chest pain began last night.  He describes it as nonexertional, intermittent, associated with diaphoresis and radiation into the left arm.  He states that he feels there is a tight and squeezing around his chest and his pain as a 10 out of 10 at its worst.  He has a past history of psychiatric disorder, he has a 30-pack-year smoking history, family history is positive for father having 12 MIs the first of which was at 47 years of age.  The patient also has risk factor of hyperlipidemia.  He does not appear appear to be severely overweight and he is nondiabetic without hypertension.  Patient did not take any aspirin or nitroglycerin  prior to arrival.  Past Medical History  Diagnosis Date  . Anxiety   . Bipolar affective disorder   . Chronic back pain   . DDD (degenerative disc disease)   . DJD (degenerative joint disease)   . GERD (gastroesophageal reflux disease)   . Head injury   . Hyperlipidemia   . Hypothyroid   . IBS (irritable bowel syndrome)    Past Surgical History  Procedure Laterality Date  . Back surgery    . Septoplasty     History reviewed. No pertinent family history. History  Substance Use Topics  . Smoking status: Current Every Day Smoker  . Smokeless tobacco: Not on file  . Alcohol Use: No    Review of Systems  Constitutional: Negative for fever and chills.  Respiratory: Negative for cough and shortness of breath.   Cardiovascular: Positive for chest pain. Negative for palpitations.  Gastrointestinal: Positive for nausea. Negative for vomiting, abdominal pain, diarrhea, constipation and anorexia.  Genitourinary: Negative for dysuria, urgency and frequency.  Musculoskeletal: Negative for myalgias, back pain and arthralgias.  Skin: Negative for rash.  Neurological: Negative for headaches.  Psychiatric/Behavioral: Negative for altered mental status.    Allergies  Zolpidem tartrate and Demerol  Home Medications   Current Outpatient Rx  Name  Route  Sig  Dispense  Refill  . benztropine (COGENTIN) 1 MG tablet   Oral   Take 1 mg by mouth 2 (two) times daily.         Marland Kitchen buPROPion (WELLBUTRIN XL)  300 MG 24 hr tablet   Oral   Take 300 mg by mouth every evening.         . clonazePAM (KLONOPIN) 1 MG tablet   Oral   Take 1 mg by mouth 3 (three) times daily as needed for anxiety.          . DULoxetine (CYMBALTA) 60 MG capsule   Oral   Take 60 mg by mouth every evening.         Marland Kitchen levothyroxine (SYNTHROID, LEVOTHROID) 75 MCG tablet   Oral   Take 75 mcg by mouth every evening.         . nicotine (NICODERM CQ - DOSED IN MG/24 HOURS) 21 mg/24hr patch   Transdermal    Place 1 patch onto the skin daily.         . nicotine polacrilex (COMMIT) 4 MG lozenge   Buccal   Place 4 mg inside cheek as needed for smoking cessation.         . pantoprazole (PROTONIX) 40 MG tablet   Oral   Take 40 mg by mouth every evening.         . pravastatin (PRAVACHOL) 40 MG tablet   Oral   Take 40 mg by mouth every evening.         . risperiDONE (RISPERDAL) 3 MG tablet   Oral   Take 6 mg by mouth daily.          . traZODone (DESYREL) 100 MG tablet   Oral   Take 200 mg by mouth at bedtime.          BP 114/80  Pulse 95  Temp(Src) 98.2 F (36.8 C)  Resp 20  SpO2 97% Physical Exam  Nursing note and vitals reviewed. Constitutional: He is oriented to person, place, and time. He appears well-developed and well-nourished. No distress.  HENT:  Head: Normocephalic and atraumatic.  Eyes: Conjunctivae are normal. No scleral icterus.  Neck: Normal range of motion. Neck supple.  Cardiovascular: Normal rate, regular rhythm and normal heart sounds.   Pulmonary/Chest: Effort normal and breath sounds normal. No respiratory distress.  Abdominal: Soft. There is no tenderness.  Musculoskeletal: He exhibits no edema.  Neurological: He is alert and oriented to person, place, and time. He has normal reflexes.  Extrapyramidal movements and tardive dyskenisia  Skin: Skin is warm and dry. He is not diaphoretic.  Psychiatric: His behavior is normal.    ED Course   Procedures (including critical care time)  Labs Reviewed  CBC - Abnormal; Notable for the following:    WBC 11.7 (*)    All other components within normal limits  BASIC METABOLIC PANEL - Abnormal; Notable for the following:    Sodium 134 (*)    GFR calc non Af Amer 82 (*)    All other components within normal limits  POCT I-STAT TROPONIN I   No results found. 1. Chest pain at rest      Date: 12/25/2012  Rate: 63  Rhythm: normal sinus rhythm  QRS Axis: normal  Intervals: normal  ST/T Wave  abnormalities: normal  Conduction Disutrbances:none  Narrative Interpretation:   Old EKG Reviewed: unchanged     MDM  7:18 PM BP 113/78  Pulse 72  Temp(Src) 98.2 F (36.8 C)  Resp 18  SpO2 93% Patient with chest pain and sig risk factors for ACS, Given morphine and asa.    8:12 PM Patient with normal EKG unchanged form previous. He has a leukicytosis.  He continues to complain of cp and radiation to L arm. Negative troponin. I have redosed Morphine Blood pressure does not appear to permit nitroglycerine at this time.  8:31 PM Patient will be admitted for observation of CP.       Arthor Captain, PA-C 12/26/12 [phone]

## 2012-12-25 NOTE — ED Notes (Signed)
Internal Medicine MD at bedside

## 2012-12-25 NOTE — H&P (Signed)
LABARRON DURNIN is an 47 y.o. male.    [facility] ADMISSION H&P  Chief Complaint: CP  HPI: 46 yr. Old WM w/ pmhx significant for bipolar disorder, GERD, hypothyroidism, hx fall with SAH/SDH, chronic back pain due to DJD/DDD, presented with CP.  He states he was awoken from sleep last night with a squeezing sensation over his chest. He states this was associated with diaphoresis and palpitations, as well as radiation to his right arm.  He states this pain then turned into a sharp pain that has been present since this.  He denies any SOB.  He states the pressure sensation lasted only about 5 seconds.  He states he has noticed CP with exertion of about 1/2 a mile, this is associated with some diaphoresis.  He denies any hx of LHC, but states he has been to the ED before.  His PCP is Dr. Clovis Riley. In the ED, his Trop I is negative.  He is hemodynamically stable.  EKG done in the ED is of very poor quality. CXR 2 view shows some small amounts of pleural fluid in costophrenic angles.  Past Medical History  Diagnosis Date  . Anxiety   . Bipolar affective disorder   . Chronic back pain   . DDD (degenerative disc disease)   . DJD (degenerative joint disease)   . GERD (gastroesophageal reflux disease)   . Head injury   . Hyperlipidemia   . Hypothyroid   . IBS (irritable bowel syndrome)     Past Surgical History  Procedure Laterality Date  . Back surgery    . Septoplasty      History reviewed. No pertinent family history. Social History:  reports that he has been smoking.  He does not have any smokeless tobacco history on file. He reports that he does not drink alcohol or use illicit drugs.  Allergies:  Allergies  Allergen Reactions  . Zolpidem Tartrate     hallucination  . Demerol (Meperidine) Rash     (Not in a hospital admission)  Results for orders placed during the hospital encounter of 12/25/12 (from the past 48 hour(s))  CBC     Status: Abnormal   Collection Time   12/25/12  6:47 PM      Result Value Range   WBC 11.7 (*) 4.0 - 10.5 K/uL   RBC 4.55  4.22 - 5.81 MIL/uL   Hemoglobin 14.1  13.0 - 17.0 g/dL   HCT 19.1  47.8 - 29.5 %   MCV 86.2  78.0 - 100.0 fL   MCH 31.0  26.0 - 34.0 pg   MCHC 36.0  30.0 - 36.0 g/dL   RDW 62.1  30.8 - 65.7 %   Platelets 227  150 - 400 K/uL  BASIC METABOLIC PANEL     Status: Abnormal   Collection Time    12/25/12  6:47 PM      Result Value Range   Sodium 134 (*) 135 - 145 mEq/L   Potassium 3.9  3.5 - 5.1 mEq/L   Chloride 100  96 - 112 mEq/L   CO2 24  19 - 32 mEq/L   Glucose, Bld 78  70 - 99 mg/dL   BUN 13  6 - 23 mg/dL   Creatinine, Ser 8.46  0.50 - 1.35 mg/dL   Calcium 9.5  8.4 - 96.2 mg/dL   GFR calc non Af Amer 82 (*) >90 mL/min   GFR calc Af Amer >90  >90 mL/min   Comment:  The eGFR has been calculated     using the CKD EPI equation.     This calculation has not been     validated in all clinical     situations.     eGFR's persistently     <90 mL/min signify     possible Chronic Kidney Disease.  POCT I-STAT TROPONIN I     Status: None   Collection Time    12/25/12  7:25 PM      Result Value Range   Troponin i, poc 0.01  0.00 - 0.08 ng/mL   Comment 3            Comment: Due to the release kinetics of cTnI,     a negative result within the first hours     of the onset of symptoms does not rule out     myocardial infarction with certainty.     If myocardial infarction is still suspected,     repeat the test at appropriate intervals.   Dg Chest 2 View  12/25/2012   *RADIOLOGY REPORT*  Clinical Data: Chest pain.  Short of breath.  Cough.  CHEST - 2 VIEW  Comparison: 02/25/2012  Findings: Heart size is normal.  Mediastinal shadows are normal. There is mild blunting of the posterior costophrenic angles that could relate to small effusions.  Lungs are otherwise clear. Neurostimulator in place in the mid thoracic region.  No acute bony finding.  IMPRESSION: No active cardiopulmonary disease other  than the possibility of a small amount of pleural fluid in the posterior costophrenic angles. Neurostimulator in place.   Original Report Authenticated By: Paulina Fusi, M.D.    Review of Systems  Constitutional: Positive for diaphoresis. Negative for fever, chills and weight loss.  Eyes: Negative for blurred vision, double vision and photophobia.  Respiratory: Negative for cough, hemoptysis, sputum production and shortness of breath.   Cardiovascular: Positive for chest pain and palpitations. Negative for orthopnea, leg swelling and PND.  Gastrointestinal: Positive for nausea. Negative for vomiting, abdominal pain, diarrhea, blood in stool and melena.  Neurological: Positive for dizziness. Negative for loss of consciousness and headaches.  Psychiatric/Behavioral: Negative for depression.    Blood pressure 110/79, pulse 66, temperature 98.2 F (36.8 C), resp. rate 16, SpO2 94.00%. Physical Exam  Constitutional: He is oriented to person, place, and time. He appears well-developed and well-nourished. No distress.  HENT:  Head: Normocephalic and atraumatic.  Eyes: Conjunctivae and EOM are normal. Pupils are equal, round, and reactive to light.  Neck: Normal range of motion. Neck supple.  Cardiovascular: Normal rate, regular rhythm, normal heart sounds and intact distal pulses.  Exam reveals no gallop and no friction rub.   No murmur heard. Respiratory: Effort normal and breath sounds normal. No respiratory distress. He has no wheezes. He exhibits no tenderness.  GI: Soft. Bowel sounds are normal. He exhibits no distension and no mass. There is no tenderness. There is no rebound and no guarding.  Musculoskeletal: Normal range of motion. He exhibits no edema.  Neurological: He is alert and oriented to person, place, and time. No cranial nerve deficit.  Skin: Skin is warm. He is not diaphoretic.  Psychiatric: He has a normal mood and affect.     Assessment/Plan 46 yr. Old WM w/ pmhx  significant for bipolar disorder, GERD, hypothyroidism, hx fall with SAH/SDH, chronic back pain due to DJD/DDD, presented with CP. 1) Atypical CP: His TIMI is 0.  At this time, he will be started on  ASA. He will be placed on telemetry.  Trop I will be trended x 3. He is on a statin. Lipid panel will be obtained in the AM as well as a HgbA1C.  He would be a good candidate for a treadmill ECG, cardiology input can be obtained in the AM, due to his complaints of CP with exertion.   2) Bipolar disorder: Continue home meds. 3) HL: continue statin. Lipid panel ordered. 4) Proh: SCD's. 5) Code: FULL  Serria Sloma 12/25/2012, 9:22 PM

## 2012-12-25 NOTE — ED Notes (Signed)
IV team returned page and stated that she was coming down.

## 2012-12-25 NOTE — ED Notes (Addendum)
Pt with L sided CP since last night, describes as sharp pressure. Pt states he feels sweaty, nauseated and SOB with the pain. Taking acid reflux medication with no relief

## 2012-12-26 ENCOUNTER — Encounter (HOSPITAL_COMMUNITY): Payer: Self-pay | Admitting: *Deleted

## 2012-12-26 DIAGNOSIS — K219 Gastro-esophageal reflux disease without esophagitis: Secondary | ICD-10-CM

## 2012-12-26 LAB — COMPREHENSIVE METABOLIC PANEL
BUN: 13 mg/dL (ref 6–23)
Calcium: 9 mg/dL (ref 8.4–10.5)
GFR calc Af Amer: >90 mL/min (ref >90)
Glucose, Bld: 77 mg/dL (ref 70–99)
Sodium: 134 mEq/L — ABNORMAL LOW (ref 135–145)
Total Protein: 6.1 g/dL (ref 6.0–8.3)

## 2012-12-26 LAB — LIPID PANEL
HDL: 42 mg/dL (ref >39)
LDL Cholesterol: 100 mg/dL — ABNORMAL HIGH (ref 0–99)
Total CHOL/HDL Ratio: 4.9 RATIO
Triglycerides: 327 mg/dL — ABNORMAL HIGH (ref <150)
VLDL: 65 mg/dL — ABNORMAL HIGH (ref 0–40)

## 2012-12-26 LAB — CBC
HCT: 37.6 % — ABNORMAL LOW (ref 39.0–52.0)
Hemoglobin: 13.6 g/dL (ref 13.0–17.0)
WBC: 8.6 10*3/uL (ref 4.0–10.5)

## 2012-12-26 MED ORDER — HYDROCODONE-ACETAMINOPHEN 5-500 MG PO TABS: 1.0000 | ORAL_TABLET | Freq: Four times a day (QID) | ORAL | Status: DC | PRN

## 2012-12-26 MED ORDER — PANTOPRAZOLE SODIUM 40 MG PO TBEC: 40.0000 mg | DELAYED_RELEASE_TABLET | Freq: Two times a day (BID) | ORAL | Status: DC

## 2012-12-26 MED ORDER — ASPIRIN 81 MG PO CHEW
CHEWABLE_TABLET | ORAL | Status: AC
  Filled 2012-12-26: qty 1

## 2012-12-26 MED ORDER — ASPIRIN 81 MG PO TBEC: 81.0000 mg | DELAYED_RELEASE_TABLET | Freq: Every day | ORAL | Status: DC

## 2012-12-26 MED ORDER — NITROGLYCERIN 0.4 MG SL SUBL: 0.4000 mg | SUBLINGUAL_TABLET | SUBLINGUAL | Status: DC | PRN

## 2012-12-26 NOTE — Discharge Summary (Signed)
Physician Discharge Summary  [name] NWG:[id number] DOB: 22-Jun-1965 DOA: 12/25/2012  PCP: No primary provider on file.  Admit date: 12/25/2012 Discharge date: 12/26/2012  Time spent: minutes  Recommendations for Outpatient Follow-up:        Follow-up Information   Please follow up. (PCP in 1-2wks, call for appt upon discharge)     PCP/Dr. Clovis Riley to refer to outpatient cardiology for  stress test/further eval.  Followup lab -PCP to follow up on Hemoglobin A1c done today and pending at the time of discharge. Discharge Diagnoses:  Principal Problem:   Chest pain Active Problems:   HYPERLIPIDEMIA   DISORDER, BIPOLAR NOS   PAIN, CHRONIC NEC   Discharge Condition: Improved/stable  Diet recommendation: Heart healthy  Filed Weights   12/25/12 2234  Weight: 104.327 kg (230 lb)    History of present illness:  Pt is 46 yr. Old WM w/ pmhx significant for bipolar disorder, GERD, hypothyroidism, hx fall with SAH/SDH, chronic back pain due to DJD/DDD, presented with CP. He states he was awoken from sleep last night with a squeezing sensation over his chest. He states this was associated with diaphoresis and palpitations, as well as radiation to his right arm. He states this pain then turned into a sharp pain that has been present since this. He denies any SOB. He states the pressure sensation lasted only about 5 seconds.  He states he has noticed CP with exertion of about 1/2 a mile, this is associated with some diaphoresis. He denies any hx of LHC, but states he has been to the ED before. His PCP is Dr. Clovis Riley.  In the ED, his Trop I is negative. He is hemodynamically stable. EKG done in the ED is of very poor quality. CXR 2 view shows some small amounts of pleural fluid in costophrenic angles.   Hospital Course:  1) Atypical CP: His TIMI is 0. Upon admission he was started on aspirin, when necessary nitroglycerin and placed on telemetry . He is on a statin. Lipid panel was  obtained and came back with a total cholesterol of 207 HDL 42 LDL of 100, his on a statin which she is to continue upon. His hemoglobin A1c is pending at this time and his to followup with his PCP for results and further management. His troponins were cycled and negative X3, he states pain is improved. It is noted that he does have GERD and I have increased his Protonix to twice a day. Impression is that his that his pain is more likely GI versus musculoskeletal. He is to followup with his PCP for referral to outpatient cardiology for possible outpatient stress test. Plan discussed with Pt and wife at bedside. 2) Bipolar disorder: Continue home meds.  3) HL: As discussed above, continue statin.   Procedures:  none  Consultations:  none  Discharge Exam: Filed Vitals:   12/25/12 2145 12/25/12 2200 12/25/12 2234 12/26/12 0500  BP: 117/77 116/81 109/76 108/73  Pulse: 68 73 64 70  Temp:   98 F (36.7 C) 97.4 F (36.3 C)  Resp: 16 16 20 18   Height:   6' (1.829 m)   Weight:   104.327 kg (230 lb)   SpO2: 92% 93% 95% 92%    Exam:  General: alert & oriented x 3 In NAD Cardiovascular: RRR, nl S1 s2, left chest wall tenderness Respiratory: CTAB Abdomen: soft +BS NT/ND, no masses palpable Extremities: No cyanosis and no edema    Discharge Instructions  Discharge Orders  Future Orders Complete By Expires     Diet - low sodium heart healthy  As directed     Increase activity slowly  As directed         Medication List         aspirin 81 MG EC tablet  Take 1 tablet (81 mg total) by mouth daily.     benztropine 1 MG tablet  Commonly known as:  COGENTIN  Take 1 mg by mouth daily.     buPROPion 300 MG 24 hr tablet  Commonly known as:  WELLBUTRIN XL  Take 300 mg by mouth every evening.     clonazePAM 1 MG tablet  Commonly known as:  KLONOPIN  Take 1 mg by mouth 3 (three) times daily as needed for anxiety.     DULoxetine 60 MG capsule  Commonly known as:  CYMBALTA  Take  60 mg by mouth every evening.     HYDROcodone-acetaminophen 5-500 MG per tablet  Commonly known as:  VICODIN  Take 1 tablet by mouth every 6 (six) hours as needed for pain.     levothyroxine 75 MCG tablet  Commonly known as:  SYNTHROID, LEVOTHROID  Take 75 mcg by mouth every evening.     nitroGLYCERIN 0.4 MG SL tablet  Commonly known as:  NITROSTAT  Place 1 tablet (0.4 mg total) under the tongue every 5 (five) minutes as needed for chest pain.     pantoprazole 40 MG tablet  Commonly known as:  PROTONIX  Take 1 tablet (40 mg total) by mouth 2 (two) times daily.     pravastatin 40 MG tablet  Commonly known as:  PRAVACHOL  Take 40 mg by mouth every evening.     risperiDONE 3 MG tablet  Commonly known as:  RISPERDAL  Take 6 mg by mouth daily.     traZODone 100 MG tablet  Commonly known as:  DESYREL  Take 200 mg by mouth at bedtime.       Allergies  Allergen Reactions  . Zolpidem Tartrate     hallucination  . Demerol (Meperidine) Rash       Follow-up Information   Please follow up. (PCP in 1-2wks, call for appt upon discharge)        The results of significant diagnostics from this hospitalization (including imaging, microbiology, ancillary and laboratory) are listed below for reference.    Significant Diagnostic Studies: Dg Chest 2 View  12/25/2012   *RADIOLOGY REPORT*  Clinical Data: Chest pain.  Short of breath.  Cough.  CHEST - 2 VIEW  Comparison: 02/25/2012  Findings: Heart size is normal.  Mediastinal shadows are normal. There is mild blunting of the posterior costophrenic angles that could relate to small effusions.  Lungs are otherwise clear. Neurostimulator in place in the mid thoracic region.  No acute bony finding.  IMPRESSION: No active cardiopulmonary disease other than the possibility of a small amount of pleural fluid in the posterior costophrenic angles. Neurostimulator in place.   Original Report Authenticated By: Paulina Fusi, M.D.    Microbiology: No  results found for this or any previous visit (from the past 240 hour(s)).   Labs: Basic Metabolic Panel:  Recent Labs Lab 12/25/12 1847 12/26/12 0530  NA 134* 134*  K 3.9 3.7  CL 100 99  CO2 24 24  GLUCOSE 78 77  BUN 13 13  CREATININE 1.07 1.07  CALCIUM 9.5 9.0   Liver Function Tests:  Recent Labs Lab 12/26/12 0530  AST 30  ALT 60*  ALKPHOS 77  BILITOT 0.2*  PROT 6.1  ALBUMIN 3.3*   No results found for this basename: LIPASE, AMYLASE,  in the last 168 hours No results found for this basename: AMMONIA,  in the last 168 hours CBC:  Recent Labs Lab 12/25/12 1847 12/26/12 0530  WBC 11.7* 8.6  HGB 14.1 13.6  HCT 39.2 37.6*  MCV 86.2 87.0  PLT 227 228   Cardiac Enzymes:  Recent Labs Lab 12/25/12 2241 12/26/12 0530  TROPONINI <0.30 <0.30   BNP: BNP (last 3 results) No results found for this basename: PROBNP,  in the last 8760 hours CBG: No results found for this basename: GLUCAP,  in the last 168 hours     Signed:  Kyrin Garn C  [facility] 12/26/2012, 10:54 AM

## 2012-12-26 NOTE — Progress Notes (Signed)
Pt c/o 8/10 CP midsternal without radiation.  BP 109/73.  EKG obtained per protocol.  1 SL ntg given with pain decreasing to 5/10 and BP 98/62 after ntg.  Pt. States pain is relieved with movement but "comes back."  MD notified.  Orders received.  2mg  IV morphine given per order.  Pt also given GI cocktail per order.  Will continue to monitor.

## 2012-12-27 NOTE — ED Provider Notes (Signed)
Medical screening examination/treatment/procedure(s) were conducted as a shared visit with non-physician practitioner(s) and myself.  I personally evaluated the patient during the encounter.   Chest pain c risk factors.  Admit to obs  Donnetta Hutching, MD 12/27/12 [phone]

## 2013-09-07 DIAGNOSIS — M51369 Other intervertebral disc degeneration, lumbar region without mention of lumbar back pain or lower extremity pain: Secondary | ICD-10-CM

## 2013-09-07 DIAGNOSIS — M5136 Other intervertebral disc degeneration, lumbar region: Secondary | ICD-10-CM | POA: Insufficient documentation

## 2013-09-07 HISTORY — DX: Other intervertebral disc degeneration, lumbar region without mention of lumbar back pain or lower extremity pain: M51.369

## 2013-10-02 ENCOUNTER — Emergency Department (HOSPITAL_COMMUNITY): Payer: 59

## 2013-10-02 ENCOUNTER — Encounter (HOSPITAL_COMMUNITY): Payer: Self-pay | Admitting: Emergency Medicine

## 2013-10-02 ENCOUNTER — Observation Stay (HOSPITAL_COMMUNITY)
Admission: EM | Admit: 2013-10-02 | Discharge: 2013-10-05 | Disposition: A | Discharge status: Skilled Nursing Facility | Payer: 59 | Attending: Internal Medicine | Admitting: Internal Medicine

## 2013-10-02 DIAGNOSIS — E039 Hypothyroidism, unspecified: Secondary | ICD-10-CM | POA: Insufficient documentation

## 2013-10-02 DIAGNOSIS — S82109A Unspecified fracture of upper end of unspecified tibia, initial encounter for closed fracture: Principal | ICD-10-CM

## 2013-10-02 DIAGNOSIS — Z792 Long term (current) use of antibiotics: Secondary | ICD-10-CM | POA: Diagnosis not present

## 2013-10-02 DIAGNOSIS — F1410 Cocaine abuse, uncomplicated: Secondary | ICD-10-CM | POA: Diagnosis not present

## 2013-10-02 DIAGNOSIS — S82202A Unspecified fracture of shaft of left tibia, initial encounter for closed fracture: Secondary | ICD-10-CM

## 2013-10-02 DIAGNOSIS — K5900 Constipation, unspecified: Secondary | ICD-10-CM | POA: Diagnosis not present

## 2013-10-02 DIAGNOSIS — R293 Abnormal posture: Secondary | ICD-10-CM

## 2013-10-02 DIAGNOSIS — K589 Irritable bowel syndrome without diarrhea: Secondary | ICD-10-CM | POA: Insufficient documentation

## 2013-10-02 DIAGNOSIS — F3289 Other specified depressive episodes: Secondary | ICD-10-CM

## 2013-10-02 DIAGNOSIS — S82409A Unspecified fracture of shaft of unspecified fibula, initial encounter for closed fracture: Secondary | ICD-10-CM | POA: Diagnosis not present

## 2013-10-02 DIAGNOSIS — Z9689 Presence of other specified functional implants: Secondary | ICD-10-CM

## 2013-10-02 DIAGNOSIS — M545 Low back pain, unspecified: Secondary | ICD-10-CM | POA: Diagnosis not present

## 2013-10-02 DIAGNOSIS — R6889 Other general symptoms and signs: Secondary | ICD-10-CM | POA: Diagnosis present

## 2013-10-02 DIAGNOSIS — M25569 Pain in unspecified knee: Secondary | ICD-10-CM | POA: Diagnosis not present

## 2013-10-02 DIAGNOSIS — R5381 Other malaise: Secondary | ICD-10-CM | POA: Diagnosis present

## 2013-10-02 DIAGNOSIS — D72829 Elevated white blood cell count, unspecified: Secondary | ICD-10-CM | POA: Insufficient documentation

## 2013-10-02 DIAGNOSIS — K219 Gastro-esophageal reflux disease without esophagitis: Secondary | ICD-10-CM | POA: Insufficient documentation

## 2013-10-02 DIAGNOSIS — R269 Unspecified abnormalities of gait and mobility: Secondary | ICD-10-CM | POA: Diagnosis not present

## 2013-10-02 DIAGNOSIS — T56891A Toxic effect of other metals, accidental (unintentional), initial encounter: Secondary | ICD-10-CM

## 2013-10-02 DIAGNOSIS — Z8659 Personal history of other mental and behavioral disorders: Secondary | ICD-10-CM

## 2013-10-02 DIAGNOSIS — M549 Dorsalgia, unspecified: Secondary | ICD-10-CM

## 2013-10-02 DIAGNOSIS — G8929 Other chronic pain: Secondary | ICD-10-CM | POA: Diagnosis present

## 2013-10-02 DIAGNOSIS — S82143A Displaced bicondylar fracture of unspecified tibia, initial encounter for closed fracture: Secondary | ICD-10-CM

## 2013-10-02 DIAGNOSIS — F17200 Nicotine dependence, unspecified, uncomplicated: Secondary | ICD-10-CM | POA: Diagnosis not present

## 2013-10-02 DIAGNOSIS — F319 Bipolar disorder, unspecified: Secondary | ICD-10-CM | POA: Diagnosis not present

## 2013-10-02 DIAGNOSIS — S82209A Unspecified fracture of shaft of unspecified tibia, initial encounter for closed fracture: Secondary | ICD-10-CM

## 2013-10-02 DIAGNOSIS — E785 Hyperlipidemia, unspecified: Secondary | ICD-10-CM | POA: Diagnosis not present

## 2013-10-02 DIAGNOSIS — R296 Repeated falls: Secondary | ICD-10-CM

## 2013-10-02 DIAGNOSIS — S82402A Unspecified fracture of shaft of left fibula, initial encounter for closed fracture: Secondary | ICD-10-CM

## 2013-10-02 DIAGNOSIS — W19XXXA Unspecified fall, initial encounter: Secondary | ICD-10-CM | POA: Diagnosis not present

## 2013-10-02 DIAGNOSIS — F3110 Bipolar disorder, current episode manic without psychotic features, unspecified: Secondary | ICD-10-CM | POA: Diagnosis present

## 2013-10-02 DIAGNOSIS — Z9181 History of falling: Secondary | ICD-10-CM | POA: Insufficient documentation

## 2013-10-02 DIAGNOSIS — F411 Generalized anxiety disorder: Secondary | ICD-10-CM | POA: Diagnosis not present

## 2013-10-02 DIAGNOSIS — E032 Hypothyroidism due to medicaments and other exogenous substances: Secondary | ICD-10-CM

## 2013-10-02 DIAGNOSIS — M1990 Unspecified osteoarthritis, unspecified site: Secondary | ICD-10-CM | POA: Diagnosis not present

## 2013-10-02 DIAGNOSIS — S82839A Other fracture of upper and lower end of unspecified fibula, initial encounter for closed fracture: Principal | ICD-10-CM

## 2013-10-02 DIAGNOSIS — F329 Major depressive disorder, single episode, unspecified: Secondary | ICD-10-CM

## 2013-10-02 HISTORY — DX: Repeated falls: R29.6

## 2013-10-02 HISTORY — DX: Abnormal posture: R29.3

## 2013-10-02 HISTORY — DX: Presence of other specified functional implants: Z96.89

## 2013-10-02 HISTORY — DX: Personal history of other mental and behavioral disorders: Z86.59

## 2013-10-02 LAB — RAPID URINE DRUG SCREEN, HOSP PERFORMED
AMPHETAMINES: NOT DETECTED
BENZODIAZEPINES: POSITIVE — AB
Barbiturates: NOT DETECTED
COCAINE: POSITIVE — AB
Opiates: POSITIVE — AB
TETRAHYDROCANNABINOL: NOT DETECTED

## 2013-10-02 LAB — CBC WITH DIFFERENTIAL/PLATELET
BASOS PCT: 0 % (ref 0–1)
Basophils Absolute: 0 10*3/uL (ref 0.0–0.1)
Eosinophils Absolute: 0 10*3/uL (ref 0.0–0.7)
Eosinophils Relative: 0 % (ref 0–5)
HEMATOCRIT: 37.9 % — AB (ref 39.0–52.0)
HEMOGLOBIN: 13.7 g/dL (ref 13.0–17.0)
LYMPHS ABS: 0.6 10*3/uL — AB (ref 0.7–4.0)
Lymphocytes Relative: 5 % — ABNORMAL LOW (ref 12–46)
MCH: 32.6 pg (ref 26.0–34.0)
MCHC: 36.1 g/dL — ABNORMAL HIGH (ref 30.0–36.0)
MCV: 90.2 fL (ref 78.0–100.0)
MONO ABS: 0.5 10*3/uL (ref 0.1–1.0)
MONOS PCT: 5 % (ref 3–12)
NEUTROS ABS: 10.6 10*3/uL — AB (ref 1.7–7.7)
Neutrophils Relative %: 90 % — ABNORMAL HIGH (ref 43–77)
Platelets: 311 10*3/uL (ref 150–400)
RBC: 4.2 MIL/uL — ABNORMAL LOW (ref 4.22–5.81)
RDW: 12.6 % (ref 11.5–15.5)
WBC: 11.8 10*3/uL — ABNORMAL HIGH (ref 4.0–10.5)

## 2013-10-02 LAB — BASIC METABOLIC PANEL
BUN: 14 mg/dL (ref 6–23)
CHLORIDE: 102 meq/L (ref 96–112)
CO2: 22 mEq/L (ref 19–32)
Calcium: 9.3 mg/dL (ref 8.4–10.5)
Creatinine, Ser: 0.9 mg/dL (ref 0.50–1.35)
GFR calc Af Amer: >90 mL/min (ref >90)
Glucose, Bld: 117 mg/dL — ABNORMAL HIGH (ref 70–99)
POTASSIUM: 3.7 meq/L (ref 3.7–5.3)
Sodium: 141 mEq/L (ref 137–147)

## 2013-10-02 LAB — ETHANOL: Alcohol, Ethyl (B): <11 mg/dL (ref 0–11)

## 2013-10-02 MED ORDER — SODIUM CHLORIDE 0.9 % IJ SOLN
3.0000 mL | Freq: Two times a day (BID) | INTRAMUSCULAR | Status: DC
  Administered 2013-10-02 – 2013-10-05 (×5): 3 mL via INTRAVENOUS

## 2013-10-02 MED ORDER — NICOTINE 21 MG/24HR TD PT24: 21.0000 mg | MEDICATED_PATCH | Freq: Every day | TRANSDERMAL | Status: DC

## 2013-10-02 MED ORDER — OXYCODONE HCL 5 MG PO TABS
5.0000 mg | ORAL_TABLET | ORAL | Status: DC | PRN
  Administered 2013-10-02 – 2013-10-05 (×16): 15 mg via ORAL
  Filled 2013-10-02 (×16): qty 3

## 2013-10-02 MED ORDER — LEVOTHYROXINE SODIUM 75 MCG PO TABS
75.0000 ug | ORAL_TABLET | Freq: Every day | ORAL | Status: DC
  Administered 2013-10-02 – 2013-10-05 (×4): 75 ug via ORAL
  Filled 2013-10-02 (×5): qty 1

## 2013-10-02 MED ORDER — POLYETHYLENE GLYCOL 3350 17 G PO PACK
17.0000 g | PACK | Freq: Every day | ORAL | Status: DC
  Administered 2013-10-04 – 2013-10-05 (×2): 17 g via ORAL
  Filled 2013-10-02 (×4): qty 1

## 2013-10-02 MED ORDER — HEPARIN SODIUM (PORCINE) 5000 UNIT/ML IJ SOLN
5000.0000 [IU] | Freq: Three times a day (TID) | INTRAMUSCULAR | Status: DC
  Administered 2013-10-02 – 2013-10-05 (×8): 5000 [IU] via SUBCUTANEOUS
  Filled 2013-10-02 (×11): qty 1

## 2013-10-02 MED ORDER — SODIUM CHLORIDE 0.9 % IV SOLN: 250.0000 mL | INTRAVENOUS | Status: DC | PRN

## 2013-10-02 MED ORDER — FENTANYL 25 MCG/HR TD PT72
25.0000 ug | MEDICATED_PATCH | TRANSDERMAL | Status: DC
  Administered 2013-10-02: 25 ug via TRANSDERMAL
  Filled 2013-10-02: qty 1

## 2013-10-02 MED ORDER — ACETAMINOPHEN 325 MG PO TABS: 650.0000 mg | ORAL_TABLET | Freq: Four times a day (QID) | ORAL | Status: DC | PRN

## 2013-10-02 MED ORDER — ACETAMINOPHEN 650 MG RE SUPP: 650.0000 mg | Freq: Four times a day (QID) | RECTAL | Status: DC | PRN

## 2013-10-02 MED ORDER — PANTOPRAZOLE SODIUM 40 MG PO TBEC
40.0000 mg | DELAYED_RELEASE_TABLET | Freq: Every day | ORAL | Status: DC
  Administered 2013-10-03 – 2013-10-05 (×4): 40 mg via ORAL
  Filled 2013-10-02 (×4): qty 1

## 2013-10-02 MED ORDER — SODIUM CHLORIDE 0.9 % IJ SOLN: 3.0000 mL | INTRAMUSCULAR | Status: DC | PRN

## 2013-10-02 MED ORDER — NICOTINE 21 MG/24HR TD PT24
21.0000 mg | MEDICATED_PATCH | Freq: Every day | TRANSDERMAL | Status: DC
  Filled 2013-10-02: qty 1

## 2013-10-02 MED ORDER — OXYCODONE HCL 5 MG PO CAPS: 5.0000 mg | ORAL_CAPSULE | ORAL | Status: DC | PRN

## 2013-10-02 MED ORDER — MORPHINE SULFATE 4 MG/ML IJ SOLN
4.0000 mg | INTRAMUSCULAR | Status: AC | PRN
  Administered 2013-10-02 (×2): 4 mg via INTRAVENOUS
  Filled 2013-10-02 (×2): qty 1

## 2013-10-02 MED ORDER — SENNOSIDES-DOCUSATE SODIUM 8.6-50 MG PO TABS
1.0000 | ORAL_TABLET | Freq: Every day | ORAL | Status: DC
  Administered 2013-10-03 – 2013-10-04 (×2): 1 via ORAL
  Filled 2013-10-02 (×2): qty 1

## 2013-10-02 MED ORDER — ONDANSETRON HCL 4 MG/2ML IJ SOLN
4.0000 mg | INTRAMUSCULAR | Status: DC | PRN
  Administered 2013-10-02: 4 mg via INTRAVENOUS
  Filled 2013-10-02: qty 2

## 2013-10-02 MED ORDER — SIMVASTATIN 20 MG PO TABS
20.0000 mg | ORAL_TABLET | Freq: Every day | ORAL | Status: DC
  Administered 2013-10-02 – 2013-10-04 (×3): 20 mg via ORAL
  Filled 2013-10-02 (×4): qty 1

## 2013-10-02 NOTE — ED Notes (Signed)
Patient reports that he ambulated 5 feet with use of crutches and had to return to bed.

## 2013-10-02 NOTE — H&P (Signed)
Date: 10/02/2013               Patient Name:  [name] MRN: [id number]  DOB: 02-03-1966 Age / Sex: 48 y.o., male   PCP: Secundino Ginger, PA-C         Medical Service: Internal Medicine Teaching Service         Attending Physician: Dr. Bartholomew Crews, MD    First Contact: Dr. Duwaine Maxin Pager: [phone]  Second Contact: Dr. Clinton Gallant Pager: [phone]       After Hours (After 5p/  First Contact Pager: [phone]  weekends / holidays): Second Contact Pager: [phone]   Chief Complaint: fall at home  History of Present Illness: [name] is a 48 yo white male with PMH of Bipolar disorder, chronic back pain, GERD, hypothyroidism, HLD.  He presented to [facility] after a fall at home.  He reports that on Friday May 1st, his truck broke down and it was lifted on a tow truck. He was grabbing something from the inside of the truck and slipped and fell onto his left knee. He then went to [facility] to be evaluated, he notes that there he was diagnosed with a tib/fib fracture and discharged home with pain medications and crutches.  He does note that taking about 15mg  of Oxycodone does help reduce the pain and that he does not think the fentanyl patches he has been prescribed are helping the pain. He reports he hasn't had the upper body strength and his back pain prevents him from being able to use the crutches well.  This caused him to fall on Saturday and he returned to [facility] where he was again discharged home. Today he fell again while returning from the bathroom.  This time his legally separated wife stopped by and decided to take him to [facility] for [facility].  He reports he did not lose conscienceness or hit his head on any of the falls.  He does note some areas of bruising and scratches but reports his only pain in his left knee and lower back.  In the ED, Xray and CT of his left leg show tib plateau fractures and impacted fibular head, the case was discussed with Dr.  Tamera Punt (orthopeadics) who reported patient will need surgical repair in approx 1 week when swelling subsides. He was unable to ambulate safely in the ED and IMTS was consulted to admit as patient did not have safe discharge. He also notes that he has had a cough, over a week ago he went to urgent care and he was diagnosed with a upper respiratory infection, he was prescribed a 10 day course of Cipro, of which he has 3-4 pills left.    Meds: Current Facility-Administered Medications  Medication Dose Route Frequency Provider Last Rate Last Dose  . ondansetron (ZOFRAN) injection 4 mg  4 mg Intravenous Q1H PRN Alfonzo Feller, DO   4 mg at 10/02/13 1650   Current Outpatient Prescriptions  Medication Sig Dispense Refill  . esomeprazole (NEXIUM) 40 MG capsule Take 40 mg by mouth at bedtime.      . fentaNYL (DURAGESIC - DOSED MCG/HR) 25 MCG/HR patch Place 25 mcg onto the skin every 3 (three) days.      Marland Kitchen levothyroxine (SYNTHROID, LEVOTHROID) 75 MCG tablet Take 75 mcg by mouth every evening.      . nicotine (NICODERM CQ - DOSED IN MG/24 HOURS) 21 mg/24hr patch Place 21 mg onto the skin  daily.      . oxycodone (OXY-IR) 5 MG capsule Take 5-15 mg by mouth every 4 (four) hours as needed for pain.      . pravastatin (PRAVACHOL) 40 MG tablet Take 40 mg by mouth at bedtime.       Marland Kitchen PRESCRIPTION MEDICATION Take 1 tablet by mouth at bedtime. "brand new bipolar med"      . nitroGLYCERIN (NITROSTAT) 0.4 MG SL tablet Place 1 tablet (0.4 mg total) under the tongue every 5 (five) minutes as needed for chest pain.  20 tablet  0    Allergies: Allergies as of 10/02/2013 - Review Complete 10/02/2013  Allergen Reaction Noted  . Zolpidem tartrate Other (See Comments)   . Demerol [meperidine] Itching and Rash 12/25/2012   Past Medical History  Diagnosis Date  . Anxiety   . Bipolar affective disorder   . Chronic back pain   . DDD (degenerative disc disease)   . DJD (degenerative joint disease)   . GERD  (gastroesophageal reflux disease)   . Head injury   . Hyperlipidemia   . Hypothyroid   . IBS (irritable bowel syndrome)    Past Surgical History  Procedure Laterality Date  . Back surgery    . Septoplasty     History reviewed. No pertinent family history. History   Social History  . Marital Status: Married    Spouse Name: N/A    Number of Children: N/A  . Years of Education: N/A   Occupational History  . Not on file.   Social History Main Topics  . Smoking status: Current Every Day Smoker  . Smokeless tobacco: Never Used  . Alcohol Use: No  . Drug Use: No  . Sexual Activity: Not on file   Other Topics Concern  . Not on file   Social History Narrative  . No narrative on file    Review of Systems: Review of Systems  Constitutional: Negative for fever, chills and malaise/fatigue.  HENT: Negative for sore throat.   Eyes: Negative for blurred vision.  Respiratory: Positive for cough and sputum production (yellow). Negative for shortness of breath.   Cardiovascular: Negative for chest pain.  Gastrointestinal: Positive for constipation. Negative for heartburn, abdominal pain and melena.  Genitourinary: Negative for dysuria.  Musculoskeletal: Positive for falls and joint pain.  Neurological: Positive for dizziness (when exerting himself with crutches). Negative for focal weakness, loss of consciousness, weakness and headaches.  Psychiatric/Behavioral: Negative for substance abuse.     Physical Exam: Blood pressure 117/59, pulse 100, temperature 98.7 F (37.1 C), temperature source Oral, resp. rate 15, height 6' (1.829 m), weight 220 lb (99.791 kg), SpO2 95.00%. Physical Exam  Nursing note and vitals reviewed. Constitutional: He is oriented to person, place, and time and well-developed, well-nourished, and in no distress.  HENT:  Head: Normocephalic and atraumatic.  Eyes: EOM are normal.  Cardiovascular: Normal rate, regular rhythm, normal heart sounds and intact  distal pulses.   No murmur heard. Pulmonary/Chest: Effort normal and breath sounds normal. No respiratory distress. He has no wheezes. He has no rales.  Abdominal: Soft. Bowel sounds are normal. He exhibits no distension. There is no tenderness.  Musculoskeletal: He exhibits no edema.  Left lower extremity immobilized in splint.  Neurovascularly intact.  Neurological: He is alert and oriented to person, place, and time.  Skin: Skin is warm and dry.     Lab results: Basic Metabolic Panel:  Recent Labs  10/02/13 1700  NA 141  K 3.7  CL 102  CO2 22  GLUCOSE 117*  BUN 14  CREATININE 0.90  CALCIUM 9.3   CBC:  Recent Labs  10/02/13 1700  WBC 11.8*  NEUTROABS 10.6*  HGB 13.7  HCT 37.9*  MCV 90.2  PLT 311   Drugs of Abuse     Component Value Date/Time   LABOPIA POSITIVE* 10/02/2013 1727   COCAINSCRNUR POSITIVE* 10/02/2013 1727   LABBENZ POSITIVE* 10/02/2013 1727   AMPHETMU NONE DETECTED 10/02/2013 1727   THCU NONE DETECTED 10/02/2013 1727   LABBARB NONE DETECTED 10/02/2013 1727    Alcohol Level:  Recent Labs  10/02/13 1700  ETH <11    Imaging results:  Dg Tibia/fibula Left  10/02/2013   CLINICAL DATA:  Left leg pain.  EXAM: LEFT TIBIA AND FIBULA - 2 VIEW  COMPARISON:  CT scan 09/30/2013.  FINDINGS: Stable fractures of the tibial plateau and fibular head and neck.  IMPRESSION: Stable tibial plateau fractures and proximal fibular head and neck fractures.   Electronically Signed   By: Kalman Jewels M.D.   On: 10/02/2013 17:53   Ct Knee Left Wo Contrast  10/02/2013   CLINICAL DATA:  Followup medial and lateral tibial plateau fractures and fibular head fracture following two additional falls.  EXAM: CT OF THE LEFT KNEE WITHOUT CONTRAST  TECHNIQUE: Multidetector CT imaging was performed according to the standard protocol. Multiplanar CT image reconstructions were also generated.  COMPARISON:  09/30/2013 and radiographs obtained earlier today.  FINDINGS: There has been no significant  change in the previously described medial and lateral tibial plateau fractures with 3 mm of depression of the lateral fragment of the lateral tibial plateau fracture. The impacted fibular head fracture is also unchanged. Again demonstrated is a large effusion containing a fat/fluid level. Subcutaneous edema is also noted.  IMPRESSION: Stable previously described medial and lateral tibial plateau fractures and fibular head fracture. No new fractures are seen.   Electronically Signed   By: Enrique Sack M.D.   On: 10/02/2013 18:17   Dg Knee Complete 4 Views Left  10/02/2013   CLINICAL DATA:  Knee pain.  EXAM: LEFT KNEE - COMPLETE 4+ VIEW  COMPARISON:  09/30/2013  FINDINGS: Stable tibial plateau and proximal fibular fractures. Large joint effusion.  IMPRESSION: Stable fractures   Electronically Signed   By: Kalman Jewels M.D.   On: 10/02/2013 18:10     Assessment & Plan by Problem:  Tibial plateau fracture/  Multiple falls/  Alteration in self-care ability - Will need follow up with orthopaedics for surgery, no indication for emergent surgery - Admit to observation as patient unable to care for himself and at risk of further injury if discharged from ED. - Pain Control with Fentanyl patch, Oxycodone PRN - PT eval and treat - Social work consult in AM  Mild Leukocytosis -WBC of 11.8, patient afebrile and in pain.  Likely stress demargination.  - Repeat CBC in AM.  Increased AG -AG 17, bicarb 22.  Unclear significance.  May be due to cocaine use.  No signs or symptoms of hypoperfusion or infection. - Monitor Vital signs and repeat CMP in AM.    HYPERLIPIDEMIA -Pravastatin 40mg     DISORDER, BIPOLAR NOS - Unsure of new medication name he is taking.  Wife will try to bring medication bottle in AM.   Constipation -Likely due to narcotic pain medication - Miralax daily - Sinokot-S QHS    CHRONIC BACK PAIN -Continue Fentanyl patch - Oxycodone PRN    GERD -Nexium 40mg   daily     Hypothyroidism -Check TSH -Continue home Levothyroxine    Cocaine substance abuse -Positive on UDS, no history of positive UDS for cocaine, denies use.  Possible false positive.  DVT Ppx: Heparin Diet: Regular Code Status: Full Dispo: Disposition is deferred at this time, awaiting improvement of current medical problems. Anticipated discharge in approximately 1 day(s).   The patient does have a current PCP Secundino Ginger, PA-C) and does not need an [facility] hospital follow-up appointment after discharge.  The patient does not have transportation limitations that hinder transportation to clinic appointments.  Signed: Joni Reining, DO 10/02/2013, 8:02 PM

## 2013-10-02 NOTE — ED Notes (Signed)
[place] (separated).

## 2013-10-02 NOTE — ED Notes (Addendum)
Spoke with Dr. Ples Specter about plan of care.  Will attempt to ambulate patient.

## 2013-10-02 NOTE — ED Provider Notes (Signed)
CSN: [id number]     Arrival date & time 10/02/13  1319 History   First MD Initiated Contact with Patient 10/02/13 1611     Chief Complaint  Patient presents with  . Leg Injury      HPI Pt was seen at 1620.  Per pt and his family, c/o sudden onset and persistence of constant left knee "pain" for the past 3 days. Pt fell on Friday, c/o left knee pain, was taken to [facility] ER and dx with tibial plateau and fibular head fx. Pt was discharged with knee immobilizer and crutches. Pt fell last evening due to "not being able to walk on the crutches." Pt was seen at [facility] ED again yesterday and discharged "after having another xray done." Pt then fell again this morning. Pt states he "can't walk with the crutches," and "I lose my balance and fall." Pt c/o increasing left knee pain since falling twice more. Pt's family states pt lives alone and is unable to care for himself due to crutch walking and the pain in his knee. Denies prodromal symptoms before fall, no syncope/near syncope. Denies hitting head, no LOC, no AMS, no neck or back pain, no abd pain, no N/V/D, no CP/palpitations, no SOB/cough, no focal motor weakness, no tingling/numbness in extremities.    PMD: [facility] Past Medical History  Diagnosis Date  . Anxiety   . Bipolar affective disorder   . Chronic back pain   . DDD (degenerative disc disease)   . DJD (degenerative joint disease)   . GERD (gastroesophageal reflux disease)   . Head injury   . Hyperlipidemia   . Hypothyroid   . IBS (irritable bowel syndrome)    Past Surgical History  Procedure Laterality Date  . Back surgery    . Septoplasty      History  Substance Use Topics  . Smoking status: Current Every Day Smoker  . Smokeless tobacco: Never Used  . Alcohol Use: No    Review of Systems ROS: Statement: All systems negative except as marked or noted in the HPI; Constitutional: Negative for fever and chills. ; ; Eyes: Negative for eye  pain, redness and discharge. ; ; ENMT: Negative for ear pain, hoarseness, nasal congestion, sinus pressure and sore throat. ; ; Cardiovascular: Negative for chest pain, palpitations, diaphoresis, dyspnea and peripheral edema. ; ; Respiratory: Negative for cough, wheezing and stridor. ; ; Gastrointestinal: Negative for nausea, vomiting, diarrhea, abdominal pain, blood in stool, hematemesis, jaundice and rectal bleeding. . ; ; Genitourinary: Negative for dysuria, flank pain and hematuria. ; ; Musculoskeletal: Negative for back pain and neck pain. +left knee pain and trauma.; ; Skin: Negative for pruritus, rash, abrasions, blisters, bruising and skin lesion.; ; Neuro: Negative for headache, lightheadedness and neck stiffness. Negative for weakness, altered level of consciousness , altered mental status, extremity weakness, paresthesias, involuntary movement, seizure and syncope.      Allergies  Zolpidem tartrate and Demerol  Home Medications   Prior to Admission medications   Medication Sig Start Date End Date Taking? Authorizing Provider  aspirin EC 81 MG EC tablet Take 1 tablet (81 mg total) by mouth daily. 12/26/12   Sheila Oats, MD  benztropine (COGENTIN) 1 MG tablet Take 1 mg by mouth daily.     Historical Provider, MD  buPROPion (WELLBUTRIN XL) 300 MG 24 hr tablet Take 300 mg by mouth every evening.    Historical Provider, MD  clonazePAM (KLONOPIN) 1 MG tablet Take 1 mg  by mouth 3 (three) times daily as needed for anxiety.     Historical Provider, MD  DULoxetine (CYMBALTA) 60 MG capsule Take 60 mg by mouth every evening.    Historical Provider, MD  HYDROcodone-acetaminophen (VICODIN) 5-500 MG per tablet Take 1 tablet by mouth every 6 (six) hours as needed for pain. 12/26/12   Sheila Oats, MD  levothyroxine (SYNTHROID, LEVOTHROID) 75 MCG tablet Take 75 mcg by mouth every evening.    Historical Provider, MD  nitroGLYCERIN (NITROSTAT) 0.4 MG SL tablet Place 1 tablet (0.4 mg total) under the  tongue every 5 (five) minutes as needed for chest pain. 12/26/12   Sheila Oats, MD  pantoprazole (PROTONIX) 40 MG tablet Take 1 tablet (40 mg total) by mouth 2 (two) times daily. 12/26/12   Sheila Oats, MD  pravastatin (PRAVACHOL) 40 MG tablet Take 40 mg by mouth every evening.    Historical Provider, MD  risperiDONE (RISPERDAL) 3 MG tablet Take 6 mg by mouth daily.     Historical Provider, MD  traZODone (DESYREL) 100 MG tablet Take 200 mg by mouth at bedtime.    Historical Provider, MD   BP 127/82  Pulse 95  Temp(Src) 98.7 F (37.1 C) (Oral)  Resp 20  Ht 6' (1.829 m)  Wt 220 lb (99.791 kg)  BMI 29.83 kg/m2  SpO2 92% Physical Exam 1625: Physical examination:  Nursing notes reviewed; Vital signs and O2 SAT reviewed;  Constitutional: Well developed, Well nourished, Well hydrated, In no acute distress; Head:  Normocephalic, atraumatic; Eyes: EOMI, PERRL, No scleral icterus; ENMT: Mouth and pharynx normal, Mucous membranes moist; Neck: Supple, Full range of motion, No lymphadenopathy; Cardiovascular: Regular rate and rhythm, No murmur, rub, or gallop; Respiratory: Breath sounds clear & equal bilaterally, No rales, rhonchi, wheezes.  Speaking full sentences with ease, Normal respiratory effort/excursion; Chest: Nontender, Movement normal; Abdomen: Soft, Nontender, Nondistended, Normal bowel sounds; Genitourinary: No CVA tenderness; Extremities: Pulses normal, +left proximal tibia and fibula tender to palp. No deformity, no edema. NMS intact left foot. NT left hip/ankle/foot. No calf edema or asymmetry. Muscle compartments soft.; Neuro: AA&Ox3, Major CN grossly intact.  Speech clear. No gross focal motor or sensory deficits in extremities.; Skin: Color normal, Warm, Dry.   ED Course  Procedures     EKG Interpretation None      MDM  MDM Reviewed: previous chart, nursing note and vitals Reviewed previous: x-ray and CT scan Interpretation: x-ray, CT scan and labs     Results for  orders placed during the hospital encounter of [id number]  BASIC METABOLIC PANEL      Result Value Ref Range   Sodium 141  137 - 147 mEq/L   Potassium 3.7  3.7 - 5.3 mEq/L   Chloride 102  96 - 112 mEq/L   CO2 22  19 - 32 mEq/L   Glucose, Bld 117 (*) 70 - 99 mg/dL   BUN 14  6 - 23 mg/dL   Creatinine, Ser 0.90  0.50 - 1.35 mg/dL   Calcium 9.3  8.4 - 10.5 mg/dL   GFR calc non Af Amer >90  >90 mL/min   GFR calc Af Amer >90  >90 mL/min  CBC WITH DIFFERENTIAL      Result Value Ref Range   WBC 11.8 (*) 4.0 - 10.5 K/uL   RBC 4.20 (*) 4.22 - 5.81 MIL/uL   Hemoglobin 13.7  13.0 - 17.0 g/dL   HCT 37.9 (*) 39.0 - 52.0 %   MCV 90.2  78.0 - 100.0 fL   MCH 32.6  26.0 - 34.0 pg   MCHC 36.1 (*) 30.0 - 36.0 g/dL   RDW 12.6  11.5 - 15.5 %   Platelets 311  150 - 400 K/uL   Neutrophils Relative % 90 (*) 43 - 77 %   Neutro Abs 10.6 (*) 1.7 - 7.7 K/uL   Lymphocytes Relative 5 (*) 12 - 46 %   Lymphs Abs 0.6 (*) 0.7 - 4.0 K/uL   Monocytes Relative 5  3 - 12 %   Monocytes Absolute 0.5  0.1 - 1.0 K/uL   Eosinophils Relative 0  0 - 5 %   Eosinophils Absolute 0.0  0.0 - 0.7 K/uL   Basophils Relative 0  0 - 1 %   Basophils Absolute 0.0  0.0 - 0.1 K/uL  ETHANOL      Result Value Ref Range   Alcohol, Ethyl (B) <11  0 - 11 mg/dL  URINE RAPID DRUG SCREEN (HOSP PERFORMED)      Result Value Ref Range   Opiates POSITIVE (*) NONE DETECTED   Cocaine POSITIVE (*) NONE DETECTED   Benzodiazepines POSITIVE (*) NONE DETECTED   Amphetamines NONE DETECTED  NONE DETECTED   Tetrahydrocannabinol NONE DETECTED  NONE DETECTED   Barbiturates NONE DETECTED  NONE DETECTED   Dg Tibia/fibula Left 10/02/2013   CLINICAL DATA:  Left leg pain.  EXAM: LEFT TIBIA AND FIBULA - 2 VIEW  COMPARISON:  CT scan 09/30/2013.  FINDINGS: Stable fractures of the tibial plateau and fibular head and neck.  IMPRESSION: Stable tibial plateau fractures and proximal fibular head and neck fractures.   Electronically Signed   By: Kalman Jewels M.D.    On: 10/02/2013 17:53   Ct Knee Left Wo Contrast 10/02/2013   CLINICAL DATA:  Followup medial and lateral tibial plateau fractures and fibular head fracture following two additional falls.  EXAM: CT OF THE LEFT KNEE WITHOUT CONTRAST  TECHNIQUE: Multidetector CT imaging was performed according to the standard protocol. Multiplanar CT image reconstructions were also generated.  COMPARISON:  09/30/2013 and radiographs obtained earlier today.  FINDINGS: There has been no significant change in the previously described medial and lateral tibial plateau fractures with 3 mm of depression of the lateral fragment of the lateral tibial plateau fracture. The impacted fibular head fracture is also unchanged. Again demonstrated is a large effusion containing a fat/fluid level. Subcutaneous edema is also noted.  IMPRESSION: Stable previously described medial and lateral tibial plateau fractures and fibular head fracture. No new fractures are seen.   Electronically Signed   By: Enrique Sack M.D.   On: 10/02/2013 18:17   Dg Knee Complete 4 Views Left 10/02/2013   CLINICAL DATA:  Knee pain.  EXAM: LEFT KNEE - COMPLETE 4+ VIEW  COMPARISON:  09/30/2013  FINDINGS: Stable tibial plateau and proximal fibular fractures. Large joint effusion.  IMPRESSION: Stable fractures   Electronically Signed   By: Kalman Jewels M.D.   On: 10/02/2013 18:10    1900:  T/C to Ortho Dr. Tamera Punt, case discussed, including:  HPI, pertinent PM/SHx, VS/PE, dx testing, ED course and treatment:  He has evaluated the CT scan images, states this is a surgical issue but not an emergent one, pt will need surgical repair in approx 1 week (after the swelling subsides); he needs to continue knee immobilizer and crutches, if he cannot tolerate the crutches he will need a wheelchair; if pt needs admission for this issue requests to admit to medicine  service. T/C to [facility] Resident, case discussed, including:  HPI, pertinent PM/SHx, VS/PE, dx testing, ED course and  treatment:  Agreeable to admit, requests to write temporary orders, obtain observation medical bed to Dr. Zenovia Jarred service.    Alfonzo Feller, DO 10/04/13 Doran Heater

## 2013-10-02 NOTE — ED Notes (Addendum)
Pt reports he fell on Friday and had a tibial plateau fracture, then was unable to walk on crutches and fell again on Saturday and had another fracture. He was seen at [facility] for both injuries and they placed a splint on his leg and told him to f/u with ortho in 2 weeks. He states he is having severe pain in the leg and he can not walk on the crutches. Family brought him today because he lives alone and cant take care of himself and also for pain control

## 2013-10-03 ENCOUNTER — Encounter (HOSPITAL_COMMUNITY): Payer: Self-pay | Admitting: *Deleted

## 2013-10-03 ENCOUNTER — Observation Stay (HOSPITAL_COMMUNITY): Payer: 59

## 2013-10-03 DIAGNOSIS — S82409A Unspecified fracture of shaft of unspecified fibula, initial encounter for closed fracture: Secondary | ICD-10-CM | POA: Diagnosis not present

## 2013-10-03 DIAGNOSIS — G8929 Other chronic pain: Secondary | ICD-10-CM

## 2013-10-03 DIAGNOSIS — D72829 Elevated white blood cell count, unspecified: Secondary | ICD-10-CM

## 2013-10-03 DIAGNOSIS — K219 Gastro-esophageal reflux disease without esophagitis: Secondary | ICD-10-CM

## 2013-10-03 DIAGNOSIS — E785 Hyperlipidemia, unspecified: Secondary | ICD-10-CM

## 2013-10-03 DIAGNOSIS — F319 Bipolar disorder, unspecified: Secondary | ICD-10-CM

## 2013-10-03 DIAGNOSIS — M549 Dorsalgia, unspecified: Secondary | ICD-10-CM

## 2013-10-03 DIAGNOSIS — W19XXXA Unspecified fall, initial encounter: Secondary | ICD-10-CM

## 2013-10-03 DIAGNOSIS — S82109A Unspecified fracture of upper end of unspecified tibia, initial encounter for closed fracture: Secondary | ICD-10-CM

## 2013-10-03 DIAGNOSIS — F1410 Cocaine abuse, uncomplicated: Secondary | ICD-10-CM

## 2013-10-03 DIAGNOSIS — K5900 Constipation, unspecified: Secondary | ICD-10-CM

## 2013-10-03 LAB — COMPREHENSIVE METABOLIC PANEL
ALK PHOS: 70 U/L (ref 39–117)
ALT: 15 U/L (ref 0–53)
AST: 14 U/L (ref 0–37)
Albumin: 2.9 g/dL — ABNORMAL LOW (ref 3.5–5.2)
BUN: 15 mg/dL (ref 6–23)
CO2: 22 meq/L (ref 19–32)
Calcium: 9.2 mg/dL (ref 8.4–10.5)
Chloride: 102 mEq/L (ref 96–112)
Creatinine, Ser: 0.69 mg/dL (ref 0.50–1.35)
GFR calc Af Amer: >90 mL/min (ref >90)
GFR calc non Af Amer: >90 mL/min (ref >90)
Glucose, Bld: 120 mg/dL — ABNORMAL HIGH (ref 70–99)
POTASSIUM: 4.1 meq/L (ref 3.7–5.3)
SODIUM: 137 meq/L (ref 137–147)
TOTAL PROTEIN: 6.5 g/dL (ref 6.0–8.3)
Total Bilirubin: 0.4 mg/dL (ref 0.3–1.2)

## 2013-10-03 LAB — CBC
HCT: 34.7 % — ABNORMAL LOW (ref 39.0–52.0)
Hemoglobin: 12.3 g/dL — ABNORMAL LOW (ref 13.0–17.0)
MCH: 32.1 pg (ref 26.0–34.0)
MCHC: 35.4 g/dL (ref 30.0–36.0)
MCV: 90.6 fL (ref 78.0–100.0)
PLATELETS: 328 10*3/uL (ref 150–400)
RBC: 3.83 MIL/uL — AB (ref 4.22–5.81)
RDW: 12.6 % (ref 11.5–15.5)
WBC: 11 10*3/uL — ABNORMAL HIGH (ref 4.0–10.5)

## 2013-10-03 LAB — TSH: TSH: 0.428 u[IU]/mL (ref 0.350–4.500)

## 2013-10-03 MED ORDER — DIAZEPAM 5 MG PO TABS
10.0000 mg | ORAL_TABLET | Freq: Once | ORAL | Status: AC
  Administered 2013-10-03: 10 mg via ORAL

## 2013-10-03 MED ORDER — DIAZEPAM 5 MG PO TABS
ORAL_TABLET | ORAL | Status: AC
  Filled 2013-10-03: qty 2

## 2013-10-03 MED ORDER — ILOPERIDONE 4 MG PO TABS
6.0000 mg | ORAL_TABLET | Freq: Every day | ORAL | Status: DC
  Administered 2013-10-03 – 2013-10-05 (×3): 6 mg via ORAL
  Filled 2013-10-03 (×3): qty 2

## 2013-10-03 MED ORDER — NICOTINE 14 MG/24HR TD PT24
14.0000 mg | MEDICATED_PATCH | Freq: Every day | TRANSDERMAL | Status: DC
  Administered 2013-10-03 – 2013-10-05 (×4): 14 mg via TRANSDERMAL
  Filled 2013-10-03 (×4): qty 1

## 2013-10-03 MED ORDER — DIAZEPAM 5 MG PO TABS
10.0000 mg | ORAL_TABLET | Freq: Three times a day (TID) | ORAL | Status: DC | PRN
  Administered 2013-10-03 – 2013-10-05 (×7): 10 mg via ORAL
  Filled 2013-10-03 (×7): qty 2

## 2013-10-03 NOTE — Evaluation (Signed)
Physical Therapy Evaluation Patient Details Name: [name] MRN: [id number] DOB: 04-Mar-1966 Today's Date: 10/03/2013   History of Present Illness  pt presents with L Tibial Plateau fx on Friday 5/1 and has 2 falls at home while trying to get around with his crutches.    Clinical Impression  Pt continues to have balance deficits with mobility and will need A at D/C.  Pt lives alone and does not have any A or anyone that he can stay with.  At this time pt may benefit from ST-SNF at D/C for safety as pt has had multiple falls while attempting to manage at home alone.  Will continue to follow.      Follow Up Recommendations SNF    Equipment Recommendations  Rolling walker with 5" wheels    Recommendations for Other Services       Precautions / Restrictions Precautions Precautions: Fall Required Braces or Orthoses: Knee Immobilizer - Left Knee Immobilizer - Left: On at all times Restrictions Weight Bearing Restrictions: Yes LLE Weight Bearing: Non weight bearing      Mobility  Bed Mobility Overal bed mobility: Needs Assistance Bed Mobility: Supine to Sit     Supine to sit: Min assist;HOB elevated     General bed mobility comments: A with L LE only.  pt uses bedrail for coming up to sitting.    Transfers Overall transfer level: Needs assistance Equipment used: Rolling walker (2 wheeled) Transfers: Sit to/from Stand Sit to Stand: Min guard         General transfer comment: cues for UE use and positioning of LEs.    Ambulation/Gait Ambulation/Gait assistance: Min guard Ambulation Distance (Feet): 50 Feet Assistive device: Rolling walker (2 wheeled) Gait Pattern/deviations: Step-to pattern     General Gait Details: pt moves slowly and with cueing can be NWBing on L LE, though has difficulty maintaining NWBing as he fatigues and with initial standing.    Stairs            Wheelchair Mobility    Modified Rankin (Stroke Patients Only)       Balance  Overall balance assessment: Needs assistance         Standing balance support: Bilateral upper extremity supported Standing balance-Leahy Scale: Poor Standing balance comment: pt requires UE A to maintain balance and NWBing.                               Pertinent Vitals/Pain L LE 8/10 with mobility.  Premedicated.      Home Living Family/patient expects to be discharged to:: Unsure Living Arrangements: Alone               Additional Comments: pt lives alone and doesn't have anyone that can provide support at home and no one he can stay with.      Prior Function Level of Independence: Independent         Comments: Independent prior to inital fall on 5/1.       Hand Dominance        Extremity/Trunk Assessment   Upper Extremity Assessment: Defer to OT evaluation           Lower Extremity Assessment: LLE deficits/detail   LLE Deficits / Details: L LE in [place].  Strength on LE liminted by pain.       Communication   Communication: No difficulties  Cognition Arousal/Alertness: Awake/alert Behavior During Therapy: WFL for tasks assessed/performed Overall Cognitive  Status: Within Functional Limits for tasks assessed                      General Comments      Exercises        Assessment/Plan    PT Assessment Patient needs continued PT services  PT Diagnosis Difficulty walking;Acute pain   PT Problem List Decreased strength;Decreased activity tolerance;Decreased balance;Decreased mobility;Decreased knowledge of use of DME;Decreased knowledge of precautions;Pain  PT Treatment Interventions DME instruction;Gait training;Stair training;Functional mobility training;Therapeutic activities;Therapeutic exercise;Balance training;Patient/family education   PT Goals (Current goals can be found in the Care Plan section) Acute Rehab PT Goals Patient Stated Goal: Walk again PT Goal Formulation: With patient Time For Goal Achievement:  10/17/13 Potential to Achieve Goals: Good    Frequency Min 5X/week   Barriers to discharge Decreased caregiver support      Co-evaluation               End of Session Equipment Utilized During Treatment: Gait belt Activity Tolerance: Patient limited by fatigue;Patient limited by pain Patient left: in chair;with call bell/phone within reach Nurse Communication: Mobility status    Functional Assessment Tool Used: Clinical Judgement Functional Limitation: Mobility: Walking and moving around Mobility: Walking and Moving Around Current Status ([id number]): At least 1 percent but less than 20 percent impaired, limited or restricted Mobility: Walking and Moving Around Goal Status [phone]): 0 percent impaired, limited or restricted    Time: 0904-0920 PT Time Calculation (min): 16 min   Charges:   PT Evaluation $Initial PT Evaluation Tier I: 1 Procedure PT Treatments $Gait Training: 8-22 mins   PT G Codes:   Functional Assessment Tool Used: Clinical Judgement Functional Limitation: Mobility: Walking and moving around    [employer], [place] [phone] 10/03/2013, 9:37 AM

## 2013-10-03 NOTE — Progress Notes (Signed)
OT Cancellation Note  Patient Details Name: [name] MRN: [id number] DOB: [birth year]   Cancelled Treatment:    Reason Eval/Treat Not Completed: Patient declined, no reason specified. Pt seen for OT evaluation this date, however pt declined at this time. Pt reports that his L hip hurts. When asked why L hip hurts, pt responded "because I fell on it." With questioning, pt described how he got up unassisted and fell (as described in note by Charlesetta Garibaldi, RN). OT explained fall prevention measures in the hospital and emphasized the importance of getting assistance prior to getting OOB. Pt again declined OT at this time and OT will follow up with pt to assess performance in ADLs to aid with d/c planning.   [name] [phone] 10/03/2013, 4:20 PM

## 2013-10-03 NOTE — Progress Notes (Signed)
Patient in chair and attempted to get up to the bathroom without assistance from staff, then became unsteady on feet and fell to the floor. Patient pressed call bell for assistance and staff immediately went to room and assisted patient back to chair. Bathroom assistance was offered and patient refused claiming he did not have to go. Bed was made and patient was then helped back to bed and bed alarm was set. MD was paged about incident. Dr. Redmond Pulling returned page and will follow up with patient face to face.   Discovered 2 small round white pill, and 1/2 of a small orange pill at bedside when assisting patient back to bed. Questioned patient about origin of medication and pt stated "it's my pain medication, I am planing on taking them in a few minutes." Medication was then confiscated in sharps container witnessed by Carron Brazen, RN and Humphrey Rolls, RN. Dr. Redmond Pulling was notified of incident.   Patient education reinforced regarding safety and the importance on using call bell/phone to call for assistance. Bed alarm in place. Will continue to monitor.

## 2013-10-03 NOTE — Progress Notes (Signed)
UR Completed.  Vergie Living [id number] 10/03/2013

## 2013-10-03 NOTE — H&P (Signed)
Pateint seen and examined. [name] is a 48 y.o male with history of chronic lower back pain, Bipolar disorder, cocaine abuse and hypothyroidism who presents with recurrent falls. Patient states he 1st fell on May 1st and was evaluated at [facility] and was noted to have a tib/fib fracture. He was sent home on pain meds and was told to use crutches. Since then he has fallen 2 more times and states he is unable to get around on the crutches. His latest fall was yesterday and he was brought to [facility] for further evaluation. No Cp, no sob, no fevers/chills  On exam his  L lower extremity is in a brace and was noted to have edema. Lungs were clear to auscultation and his abdomen was soft, non tender with + bowel sounds  I have reviewed the note by Dr. Heber Opheim and agree with the documentation as outlined  L tib/fib fracture - No operative intervention for now per ortho. Will need repair once swelling subsides - Outpatient ortho follow up with Dr. Tamera Punt - PT recommending SNF but patient refuses at this time. States he is now able to ambulate with walker - c/w pain control  Bipolar disorder - will resume home meds and refer to psych as outpatient - Patient was on valium at home prn. Will resume home dose while here  Leukocytosis Resolving. Likely stress demargination. Will monitor  Hypothyroidism - continue with synthroid

## 2013-10-03 NOTE — Progress Notes (Addendum)
Subjective: [name] was seen and examined this AM.  He is requesting Valium and says he takes this regularly however it is not on his home medication list.  He says his appetite is good, no change in bowel or bladder.  He says he walked with assistance of walker with PT and feels he did well.  He is not interested in SNF placement.    Objective: Vital signs in last 24 hours: Filed Vitals:   10/02/13 2000 10/02/13 2030 10/02/13 2050 10/03/13 0517  BP: 118/69 117/73 120/72 101/60  Pulse: 86  92 74  Temp:   98.9 F (37.2 C) 98.7 F (37.1 C)  TempSrc:   Oral   Resp: 15 19 20 20   Height:   6' (1.829 m)   Weight:   102.105 kg (225 lb 1.6 oz)   SpO2: 93% 91% 95% 94%   Weight change:   Intake/Output Summary (Last 24 hours) at 10/03/13 1037 Last data filed at 10/03/13 0932  Gross per 24 hour  Intake      3 ml  Output    600 ml  Net   -597 ml   General: sitting up in chair in NAD HEENT: no gross abnormality, mucous membranes moist Cardiac: RRR, no rubs, murmurs or gallops Pulm: clear to auscultation bilaterally, moving normal volumes of air Abd: soft, nontender, nondistended, BS present Ext: warm and well perfused, no pedal edema Neuro: alert and oriented X3  Lab Results: Basic Metabolic Panel:  Recent Labs Lab 10/02/13 1700 10/03/13 0546  NA 141 137  K 3.7 4.1  CL 102 102  CO2 22 22  GLUCOSE 117* 120*  BUN 14 15  CREATININE 0.90 0.69  CALCIUM 9.3 9.2   Liver Function Tests:  Recent Labs Lab 10/03/13 0546  AST 14  ALT 15  ALKPHOS 70  BILITOT 0.4  PROT 6.5  ALBUMIN 2.9*   CBC:  Recent Labs Lab 10/02/13 1700 10/03/13 0546  WBC 11.8* 11.0*  NEUTROABS 10.6*  --   HGB 13.7 12.3*  HCT 37.9* 34.7*  MCV 90.2 90.6  PLT 311 328   Thyroid Function Tests:  Recent Labs Lab 10/03/13 0546  TSH 0.428   Urine Drug Screen: Drugs of Abuse     Component Value Date/Time   LABOPIA POSITIVE* 10/02/2013 1727   COCAINSCRNUR POSITIVE* 10/02/2013 1727   LABBENZ  POSITIVE* 10/02/2013 1727   AMPHETMU NONE DETECTED 10/02/2013 1727   THCU NONE DETECTED 10/02/2013 1727   LABBARB NONE DETECTED 10/02/2013 1727    Alcohol Level:  Recent Labs Lab 10/02/13 1700  ETH <11   Studies/Results: Dg Tibia/fibula Left  10/02/2013   CLINICAL DATA:  Left leg pain.  EXAM: LEFT TIBIA AND FIBULA - 2 VIEW  COMPARISON:  CT scan 09/30/2013.  FINDINGS: Stable fractures of the tibial plateau and fibular head and neck.  IMPRESSION: Stable tibial plateau fractures and proximal fibular head and neck fractures.   Electronically Signed   By: Kalman Jewels M.D.   On: 10/02/2013 17:53   Ct Knee Left Wo Contrast  10/02/2013   CLINICAL DATA:  Followup medial and lateral tibial plateau fractures and fibular head fracture following two additional falls.  EXAM: CT OF THE LEFT KNEE WITHOUT CONTRAST  TECHNIQUE: Multidetector CT imaging was performed according to the standard protocol. Multiplanar CT image reconstructions were also generated.  COMPARISON:  09/30/2013 and radiographs obtained earlier today.  FINDINGS: There has been no significant change in the previously described medial and lateral tibial plateau  fractures with 3 mm of depression of the lateral fragment of the lateral tibial plateau fracture. The impacted fibular head fracture is also unchanged. Again demonstrated is a large effusion containing a fat/fluid level. Subcutaneous edema is also noted.  IMPRESSION: Stable previously described medial and lateral tibial plateau fractures and fibular head fracture. No new fractures are seen.   Electronically Signed   By: Enrique Sack M.D.   On: 10/02/2013 18:17   Dg Knee Complete 4 Views Left  10/02/2013   CLINICAL DATA:  Knee pain.  EXAM: LEFT KNEE - COMPLETE 4+ VIEW  COMPARISON:  09/30/2013  FINDINGS: Stable tibial plateau and proximal fibular fractures. Large joint effusion.  IMPRESSION: Stable fractures   Electronically Signed   By: Kalman Jewels M.D.   On: 10/02/2013 18:10   Medications: I  have reviewed the patient's current medications. Scheduled Meds: . fentaNYL  25 mcg Transdermal Q72H  . heparin  5,000 Units Subcutaneous 3 times per day  . levothyroxine  75 mcg Oral QAC breakfast  . nicotine  14 mg Transdermal Daily  . pantoprazole  40 mg Oral Daily  . polyethylene glycol  17 g Oral Daily  . senna-docusate  1 tablet Oral QHS  . simvastatin  20 mg Oral q1800  . sodium chloride  3 mL Intravenous Q12H   Continuous Infusions: none PRN Meds:.sodium chloride, acetaminophen, acetaminophen, ondansetron, oxyCODONE, sodium chloride  Assessment/Plan: 48 yo white male with PMH of Bipolar disorder, chronic back pain, GERD, hypothyroidism and HLD who presents after several falls after recent tibial plateau fracture and inability to use crutches.     Tibial plateau fracture/ Multiple falls/ Alteration in self-care ability  - Will need follow up with orthopaedics for surgery, no indication for emergent surgery  - Pain Control with Fentanyl patch, Oxycodone PRN  - PT eval and treat - recommends SNF but patient wants to be d/c home - social work consult for help with dispo planning  Mild Leukocytosis  - WBC of 11.8 --> 11.0, patient afebrile and in pain. Likely stress demargination.  - monitor CBC  Increased AG, improving - AG 17--> 13. Unclear significance. May be due to cocaine use. No signs or symptoms of hypoperfusion or infection.  - monitor BMP  HYPERLIPIDEMIA  - continue Pravastatin 40mg    DISORDER, BIPOLAR NOS  - The patient reports taking a new medication; he reports his PCP has been giving him samples of "Sanapt, " likely Fanapt - an atyptical antipsychotic.  I will call his PCP to confirm dose.   - The patient's pharmacy confirms that he is prescribed Valium 10mg  TID prn - will continue inpatient  Constipation  - Likely due to narcotic pain medication  - continue Miralax daily and Senokot-S QHS   CHRONIC BACK PAIN  - Continue Fentanyl patch  - Oxycodone PRN    GERD  - continue Nexium 40mg  daily   Hypothyroidism - Continue home Levothyroxine   Cocaine substance abuse  -Positive on UDS, no history of positive UDS for cocaine, denies use.  DVT Ppx: Heparin  Diet: Regular  Code Status: Full  Addendum:  The patient fell later in the evening while attempting to ambulate to the bathroom.  He says he was trying to reach for his walker and fell landing on his buttocks.  He denies hitting his head.  He denies chest pain, palpitations, lightheadedness or dizziness prior to the event.  He initially said he had worsening hip pain (he reports chronic hip/back pain at baseline) however  he is pointing to lumbar area and has no groin pain, no bruising.  He later says his right shin is hurting him the most and reports this is different from the leg pain he has from the tibial plateau fracture and proximal fibular head and neck fractures.  On exam, which is limited 2/2 to left shin pain the patient denies hip pain.  Hip ROM appears intact but limited by pain from known fractures in lower leg.  He says he did not hit the anterior leg but feels pain is worse.  Patient concerned that he may have sustained further leg injury and would like reassurance so will order tib/fib xray.  Will continue with pain control, ice prn. Patient is now agreeable to SNF placement.   Dispo: Disposition is deferred at this time, awaiting improvement of current medical problems.  Anticipated discharge in approximately 0-1 day(s).   The patient does have a current PCP Secundino Ginger, PA-C) and does need an [facility] hospital follow-up appointment after discharge.  The patient does not know have transportation limitations that hinder transportation to clinic appointments.  .Services Needed at time of discharge: Y = Yes, Blank = No PT:   OT:   RN:   Equipment:   Other:     LOS: 1 day   Duwaine Maxin, DO 10/03/2013, 10:37 AM

## 2013-10-04 DIAGNOSIS — S82109A Unspecified fracture of upper end of unspecified tibia, initial encounter for closed fracture: Secondary | ICD-10-CM | POA: Diagnosis not present

## 2013-10-04 LAB — CBC
HCT: 32.3 % — ABNORMAL LOW (ref 39.0–52.0)
HEMOGLOBIN: 11.2 g/dL — AB (ref 13.0–17.0)
MCH: 31.6 pg (ref 26.0–34.0)
MCHC: 34.7 g/dL (ref 30.0–36.0)
MCV: 91.2 fL (ref 78.0–100.0)
Platelets: 293 10*3/uL (ref 150–400)
RBC: 3.54 MIL/uL — ABNORMAL LOW (ref 4.22–5.81)
RDW: 12.7 % (ref 11.5–15.5)
WBC: 9.1 10*3/uL (ref 4.0–10.5)

## 2013-10-04 LAB — BASIC METABOLIC PANEL
BUN: 17 mg/dL (ref 6–23)
CALCIUM: 8.6 mg/dL (ref 8.4–10.5)
CO2: 26 meq/L (ref 19–32)
CREATININE: 0.81 mg/dL (ref 0.50–1.35)
Chloride: 103 mEq/L (ref 96–112)
GFR calc Af Amer: >90 mL/min (ref >90)
GFR calc non Af Amer: >90 mL/min (ref >90)
GLUCOSE: 106 mg/dL — AB (ref 70–99)
Potassium: 3.7 mEq/L (ref 3.7–5.3)
Sodium: 141 mEq/L (ref 137–147)

## 2013-10-04 NOTE — Discharge Instructions (Signed)
1. [facility] will contact you with your appointment date and time.  Please see your PCP once you are discharged from the skilled nursing facility.   2. Please take all medications as prescribed.    3. If you have worsening of your symptoms or new symptoms arise, please call the clinic ([phone]), or go to the ER immediately if symptoms are severe.

## 2013-10-04 NOTE — Progress Notes (Signed)
Clinical Social Work Department  CLINICAL SOCIAL WORK PLACEMENT NOTE    Patient: [name]  Account Number:  [id number] Admit date: 10/02/2013  Clinical Social Worker: Rhea Pink LCSWA Date/time: 10/04/2013  4:30 PM  Clinical Social Work is seeking post-discharge placement for this patient at the following level of care: SKILLED NURSING (*CSW will update this form in Epic as items are completed)  10/04/2013 Patient/family provided with [facility] of Clinical Social Work's list of facilities offering this level of care within the geographic area requested by the patient (or if unable, by the patient's family).  10/04/2013 Patient/family informed of their freedom to choose among providers that offer the needed level of care, that participate in Medicare, Medicaid or managed care program needed by the patient, have an available bed and are willing to accept the patient.  10/04/2013 Patient/family informed of [facility]' ownership interest in [facility], as well as of the fact that they are under no obligation to receive care at this facility.  PASARR submitted to EDS on  10/04/2013 PASARR number received from EDS on   FL2 transmitted to all facilities in geographic area requested by pt/family on 10/04/2013 FL2 transmitted to all facilities within larger geographic area on  Patient informed that his/her managed care company has contracts with or will negotiate with certain facilities, including the following:  Patient/family informed of bed offers received:  Patient chooses bed at  Physician recommends and patient chooses bed at  Patient to be transferred to on  Patient to be transferred to facility by  The following physician request were entered in Epic:  Additional Comments:

## 2013-10-04 NOTE — Discharge Summary (Signed)
Patient Name:  [name]  MRN: [id number]  PCP: Secundino Ginger, PA-C  DOB:  Oct 01, 1965       Date of Admission:  10/02/2013  Date of Discharge:  10/05/2013      Attending Physician: Dr. Aldine Contes, MD        DISCHARGE DIAGNOSES: 1.   Fracture of tibia with fibula, left, closed 2.   Multiple falls 3.   Alteration in self-care ability 4.   Bipolar disorder 5.   Chronic back pain 6.   Cocaine substance abuse    DISPOSITION AND FOLLOW-UP: MODESTO GANOE is to follow-up with the listed providers as detailed below, at patient's visiting, please address following issues:  1) Orthopedic follow-up regarding surgical management of his fracture.  2) Pain clinic regarding chronic pain issues.   Follow-up Information   Follow up with Nita Sells, MD. (for hospital follow-up)    Specialty:  Orthopedic Surgery   Contact information:   [address] [place] [address] [phone]       Follow up with DURAN,MICHAEL R, PA-C. (for hospital follow-up)    Specialty:  Cardiology   Contact information:   [facility]  [address] [place]  [address] [phone]      Discharge Orders   Future Orders Complete By Expires   Call MD for:  difficulty breathing, headache or visual disturbances  As directed    Call MD for:  extreme fatigue  As directed    Call MD for:  hives  As directed    Call MD for:  persistant dizziness or light-headedness  As directed    Call MD for:  persistant nausea and vomiting  As directed    Call MD for:  severe uncontrolled pain  As directed    Call MD for:  temperature >100.4  As directed    Diet - low sodium heart healthy  As directed    Increase activity slowly  As directed        DISCHARGE MEDICATIONS:   Medication List         esomeprazole 40 MG capsule  Commonly known as:  NEXIUM  Take 40 mg by mouth at bedtime.     fentaNYL 25 MCG/HR patch  Commonly known as:  DURAGESIC - dosed  mcg/hr  Place 25 mcg onto the skin every 3 (three) days.     levothyroxine 75 MCG tablet  Commonly known as:  SYNTHROID, LEVOTHROID  Take 75 mcg by mouth every evening.     nicotine 21 mg/24hr patch  Commonly known as:  NICODERM CQ - dosed in mg/24 hours  Place 21 mg onto the skin daily.     nitroGLYCERIN 0.4 MG SL tablet  Commonly known as:  NITROSTAT  Place 1 tablet (0.4 mg total) under the tongue every 5 (five) minutes as needed for chest pain.     oxycodone 5 MG capsule  Commonly known as:  OXY-IR  Take 5-15 mg by mouth every 4 (four) hours as needed for pain.     pravastatin 40 MG tablet  Commonly known as:  PRAVACHOL  Take 40 mg by mouth at bedtime.     PRESCRIPTION MEDICATION  Take 1 tablet by mouth at bedtime. - iloperidone 6mg  daily (Brand name - Fanapt)       CONSULTS:    PT   PROCEDURES PERFORMED:  Dg Tibia/fibula Left  10/02/2013   CLINICAL DATA:  Left leg pain.  EXAM: LEFT TIBIA  AND FIBULA - 2 VIEW  COMPARISON:  CT scan 09/30/2013.  FINDINGS: Stable fractures of the tibial plateau and fibular head and neck.  IMPRESSION: Stable tibial plateau fractures and proximal fibular head and neck fractures.   Electronically Signed   By: Kalman Jewels M.D.   On: 10/02/2013 17:53   Ct Knee Left Wo Contrast  10/02/2013   CLINICAL DATA:  Followup medial and lateral tibial plateau fractures and fibular head fracture following two additional falls.  EXAM: CT OF THE LEFT KNEE WITHOUT CONTRAST  TECHNIQUE: Multidetector CT imaging was performed according to the standard protocol. Multiplanar CT image reconstructions were also generated.  COMPARISON:  09/30/2013 and radiographs obtained earlier today.  FINDINGS: There has been no significant change in the previously described medial and lateral tibial plateau fractures with 3 mm of depression of the lateral fragment of the lateral tibial plateau fracture. The impacted fibular head fracture is also unchanged. Again demonstrated is a large  effusion containing a fat/fluid level. Subcutaneous edema is also noted.  IMPRESSION: Stable previously described medial and lateral tibial plateau fractures and fibular head fracture. No new fractures are seen.   Electronically Signed   By: Enrique Sack M.D.   On: 10/02/2013 18:17   Dg Knee Complete 4 Views Left  10/02/2013   CLINICAL DATA:  Knee pain.  EXAM: LEFT KNEE - COMPLETE 4+ VIEW  COMPARISON:  09/30/2013  FINDINGS: Stable tibial plateau and proximal fibular fractures. Large joint effusion.  IMPRESSION: Stable fractures   Electronically Signed   By: Kalman Jewels M.D.   On: 10/02/2013 18:10   Dg Tibia/fibula Left Port  10/04/2013   CLINICAL DATA:  Known tib/fib fracture with fall and new shin pain.  EXAM: PORTABLE LEFT TIBIA AND FIBULA - 2 VIEW  COMPARISON:  10/02/2013  FINDINGS: Oblique fracture through the fibular head and neck, with moderate medial displacement.  Lateral tibial plateau fracture is without interval displacement. 2 mm of fracture offset persists along the central articular surface of the lateral tibial plateau.  Lipohemarthrosis.  IMPRESSION: No interval displacement of tibial plateau and fibular head/neck fractures.   Electronically Signed   By: Jorje Guild M.D.   On: 10/04/2013 05:38      ADMISSION DATA: H&P: [name] is a 48 yo white male with PMH of Bipolar disorder, chronic back pain, GERD, hypothyroidism, HLD. He presented to [facility] after a fall at home. He reports that on Friday May 1st, his truck broke down and it was lifted on a tow truck. He was grabbing something from the inside of the truck and slipped and fell onto his left knee. He then went to [place] [facility] to be evaluated, he notes that there he was diagnosed with a tib/fib fracture and discharged home with pain medications and crutches. He does note that taking about 15mg  of Oxycodone does help reduce the pain and that he does not think the fentanyl patches he has been prescribed are helping the  pain. He reports he hasn't had the upper body strength and his back pain prevents him from being able to use the crutches well. This caused him to fall on Saturday and he returned to [facility] where he was again discharged home. Today he fell again while returning from the bathroom. This time his legally separated wife stopped by and decided to take him to [facility] for evaluation. He reports he did not lose conscienceness or hit his head on any of the falls. He does  note some areas of bruising and scratches but reports his only pain in his left knee and lower back. In the ED, Xray and CT of his left leg show tib plateau fractures and impacted fibular head, the case was discussed with Dr. Tamera Punt (orthopeadics) who reported patient will need surgical repair in approx 1 week when swelling subsides. He was unable to ambulate safely in the ED and IMTS was consulted to admit as patient did not have safe discharge.  He also notes that he has had a cough, over a week ago he went to urgent care and he was diagnosed with a upper respiratory infection, he was prescribed a 10 day course of Cipro, of which he has 3-4 pills left.   Physical Exam: Blood pressure 117/59, pulse 100, temperature 98.7 F (37.1 C), temperature source Oral, resp. rate 15, height 6' (1.829 m), weight 220 lb (99.791 kg), SpO2 95.00%.  Physical Exam  Nursing note and vitals reviewed.  Constitutional: He is oriented to person, place, and time and well-developed, well-nourished, and in no distress.  HENT:  Head: Normocephalic and atraumatic.  Eyes: EOM are normal.  Cardiovascular: Normal rate, regular rhythm, normal heart sounds and intact distal pulses.  No murmur heard.  Pulmonary/Chest: Effort normal and breath sounds normal. No respiratory distress. He has no wheezes. He has no rales.  Abdominal: Soft. Bowel sounds are normal. He exhibits no distension. There is no tenderness.  Musculoskeletal: He exhibits no edema.  Left  lower extremity immobilized in splint. Neurovascularly intact.  Neurological: He is alert and oriented to person, place, and time.  Skin: Skin is warm and dry.   Labs: Basic Metabolic Panel:   Recent Labs   10/02/13 1700   NA  141   K  3.7   CL  102   CO2  22   GLUCOSE  117*   BUN  14   CREATININE  0.90   CALCIUM  9.3    CBC:   Recent Labs   10/02/13 1700   WBC  11.8*   NEUTROABS  10.6*   HGB  13.7   HCT  37.9*   MCV  90.2   PLT  311    Drugs of Abuse    Component  Value  Date/Time    LABOPIA  POSITIVE*  10/02/2013 1727    COCAINSCRNUR  POSITIVE*  10/02/2013 1727    LABBENZ  POSITIVE*  10/02/2013 1727    AMPHETMU  NONE DETECTED  10/02/2013 1727    THCU  NONE DETECTED  10/02/2013 1727    LABBARB  NONE DETECTED  10/02/2013 1727    Alcohol Level:   Recent Labs   10/02/13 [place]  <11    HOSPITAL COURSE: Closed fracture of tibia and fibula, left/ Multiple falls/ Alteration in self-care ability  Orthopedics was informed of the situation prior to the patient's admission from the ER.  There was no indication for emergency surgery and surgical intervention would need to be postponed for about 1 week, after the swelling subsided.  Recommendations were to continue the knee immobilizer and crutches.  The patient has chronic back pain and postural imbalance which made it difficult to ambulate with crutches and it was felt he would be unsafe if discharged home alone.  Initially the patient was resistant to PT recommended by SNF but later agreed to short-term placement.  The patient was discharged to skilled nursing faciltiy in stable condition.  The [employer]' office has been given the  patient's name and contact information.  Someone from the office will contact the patient with his appointment date and time.    Bipolar disorder  The patient reported receiving samples of Fanapt (iloperidone), an atypical antipsychotic from doctor.  He said he had been taking the medication  daily for several months.  His wife brought in the bottle to confirm name and dose.  This medication was continued during admission.  His wife will be able to provide this medication to the nursing facility if they do not have it on formulary.  Chronic back pain  The patient is s/p spinal cord stimulator placement and reports attending a pain management clinic where he receives his medications.  His chronic pain medications were continued during hospitalization.  He was caught on more than one occasion "saving" pills that were administered during admission.  These medications were immediately disposed of by the nurse.  The patient was advised that he could not save medications for later.  He was directly observed taking medications during his stay.  He does require pain medication given chronic pain history and acute pain from left lower extremity fracture.  Would not recommend crushing oxycodone as some formulations were designed to prevent misuse and should not be crushed, dissolved or tampered with.  Would recommend continued direct observation of his pain medication and careful monitoring at the skilled nursing facility.   DISCHARGE DATA: Vital Signs: BP 121/76  Pulse 78  Temp(Src) 97.7 F (36.5 C) (Oral)  Resp 18  Ht 6' (1.829 m)  Wt 102.105 kg (225 lb 1.6 oz)  BMI 30.52 kg/m2  SpO2 98%   Services Ordered on Discharge: Y = Yes; Blank = No PT:   OT:   RN:   Equipment:   Other:      Time Spent on Discharge: 35 min   Signed: Duwaine Maxin PGY 1, Internal Medicine Resident 10/05/2013, 3:21 PM

## 2013-10-04 NOTE — Progress Notes (Signed)
Patient given Oxycodone 15mg  PO at 0204, supervised patient with what appeared to be swallowing pills whole and chasing with water. After departing room and watching room surveillance camera, patient appeared to be taking pills out of mouth and placing them on his left side in the bed along with picking up a plastic spoon and straw. Charge RN called to supervise questioning of patient. Patient questioned about removing pills from mouth and patient's intent. Patient denies removing pills from the mouth and patient reminded of safety camera in the room along with continued surveillance of camera for the patient's safety. Surveillance camera sign posted in the room. Patient found to have three small, white round pills in Pringles chips lid, along with straw and spoon beside of the lid in the bed. Patient confronted reminding patient that we are here to help him and the pain pills are meant for him to take whole to help the pain in his leg. Patient says he understands this concept and that he was just "spitting them out and then was going to swallow them again". Three small, white, round tablets confiscated and wasted in sharps container by Catrell Morrone [place] Chereese Cilento, RN and Wyatt Haste, RN witness.  Patient educated on use of call bell and importance of swallowing pain medication completely while RN present. Call bell within patient's reach. Nursing will continue to monitor patient.

## 2013-10-04 NOTE — Progress Notes (Signed)
Physical Therapy Treatment Patient Details Name: [name] MRN: [id number] DOB: 1965-06-26 Today's Date: 10/04/2013    History of Present Illness pt presents with L Tibial Plateau fx on Friday 5/1 and has 2 falls at home while trying to get around with his crutches.  pt with fall trying to get to bathroom by himself on 5/4.      PT Comments    Pt needs strong education about attempting to maintain NWBing status as he tends to keep L LE more as TDWBing or sometimes even PWBing.  Pt returned to bed with bed alarm on as pt has not been consistently calling RN for A and had a fall yesterday.    Follow Up Recommendations  SNF     Equipment Recommendations  Rolling walker with 5" wheels    Recommendations for Other Services       Precautions / Restrictions Precautions Precautions: Fall Required Braces or Orthoses: Knee Immobilizer - Left Knee Immobilizer - Left: On at all times Restrictions Weight Bearing Restrictions: Yes LLE Weight Bearing: Non weight bearing    Mobility  Bed Mobility Overal bed mobility: Needs Assistance Bed Mobility: Supine to Sit;Sit to Supine     Supine to sit: Min assist;HOB elevated Sit to supine: Min assist;HOB elevated   General bed mobility comments: A with L LE only.  pt uses bedrail for coming up to sitting.    Transfers Overall transfer level: Needs assistance Equipment used: Rolling walker (2 wheeled) Transfers: Sit to/from Stand Sit to Stand: Min guard Stand pivot transfers: Min guard       General transfer comment: cues for UE use and NWBing on L LE.  Despite cueing, pt WBing on L LE with both coming to stand and returning to sitting.    Ambulation/Gait Ambulation/Gait assistance: Min guard Ambulation Distance (Feet): 70 Feet Assistive device: Rolling walker (2 wheeled) Gait Pattern/deviations: Step-to pattern     General Gait Details: pt again needs cueing to maintain NWBing and at times seems to be TDWBing or occasionally  PWBing despite strong sueing.  demos good use of RW.     Stairs            Wheelchair Mobility    Modified Rankin (Stroke Patients Only)       Balance Overall balance assessment: Needs assistance Sitting-balance support: No upper extremity supported;Feet supported Sitting balance-Leahy Scale: Good     Standing balance support: Bilateral upper extremity supported Standing balance-Leahy Scale: Poor Standing balance comment: pt requires UEs to maintain standing.                      Cognition Arousal/Alertness: Awake/alert Behavior During Therapy: WFL for tasks assessed/performed Overall Cognitive Status: Within Functional Limits for tasks assessed                      Exercises      General Comments        Pertinent Vitals/Pain L LE 8/10.  Premedicated.      Home Living Family/patient expects to be discharged to:: Private residence Living Arrangements: Alone Available Help at Discharge: Family;Available PRN/intermittently (ex-wife and 75 y.o daughter) Type of Home: Apartment Home Access: Level entry   Home Layout: One level Home Equipment: Crutches Additional Comments: Pt lives alone and reports that ex-wife and daughter are available to help intermittent. However, feel that pt will need 24/7 supervision to prevent further falls.     Prior Function Level of Independence: Independent  Comments: Independent prior to inital fall on 5/1.     PT Goals (current goals can now be found in the care plan section) Acute Rehab PT Goals Patient Stated Goal: To go home Time For Goal Achievement: 10/17/13 Potential to Achieve Goals: Good Progress towards PT goals: Progressing toward goals    Frequency  Min 5X/week    PT Plan Current plan remains appropriate    Co-evaluation             End of Session Equipment Utilized During Treatment: Gait belt;Left knee immobilizer Activity Tolerance: Patient limited by lethargy;Patient limited by  pain Patient left: in bed;with call bell/phone within reach;with bed alarm set     Time: 4496-7591 PT Time Calculation (min): 15 min  Charges:  $Gait Training: 8-22 mins                    G CodesCatarina Hartshorn, [place] [phone] 10/04/2013, 11:20 AM

## 2013-10-04 NOTE — Progress Notes (Signed)
Patient seen and examined with Dr. Redmond Pulling. Agree with documentation as outlined in the note. Pt s/p fall yesterday. Repeat X ray done with no new fractures. Pt still with pain at fracture site. Pt noted to have pills at bedside which he stated were his pain pills that he was going to take soon. Pills were disposed of by RN. No CP, no sob, no fevers/chills   On exam his L lower extremity is in a brace and was noted to have edema.  Lungs - clear to auscultation Abdomen - soft, non tender, + bowel sounds  Cardiovascular- Heart sounds normal, Regular rate and rhythm  L tib/fib fracture - No operative intervention for now per ortho. Will need repair once swelling subsides  - Outpatient ortho follow up with Dr. Tamera Punt  - Pt now agreeable to SNF. Will attempt placement today - c/w pain control   Bipolar disorder  - will resume home dose of Fanapt and refer to psych as outpatient - C/w valium prn (home dose).    Leukocytosis  Resolved.. Likely stress demargination.  Will monitor   Hypothyroidism  - continue with home dose of levothyroxine  Dispo Pt stable for discharge today to SNF

## 2013-10-04 NOTE — Progress Notes (Signed)
Came to visit patient on behalf of [employer]/ [employer] to [employer] program for [employer] employees/dependents with [employer]. Notified by inpatient LCSW that patient's plan is to go to SNF at discharge. Made [facility] aware. [facility] awaiting pre-cert information. Will continue to follow. Left brochure and contact information at patient's bedside.  Marthenia Rolling, MSN- RN,BSN- [facility] [phone]

## 2013-10-04 NOTE — Progress Notes (Addendum)
Subjective: [name] fell yesterday but no new fracture on xray.  Questionable "pill saving" witnessed by staff yesterday evening and overnight.  He was seen and examined this AM.  Still w/ leg pain s/p fracture.  Otherwise ok, appetite good.  He is agreeable to short term stay at SNF for assistance with ADLs/24 hour supervision while his leg fracture is managed.   Objective: Vital signs in last 24 hours: Filed Vitals:   10/03/13 1300 10/03/13 2246 10/04/13 0300 10/04/13 0330  BP: 104/58 96/54 88/51  90/52  Pulse: 81 73 74   Temp: 97.5 F (36.4 C) 97.7 F (36.5 C) 97.6 F (36.4 C)   TempSrc:  Oral Oral   Resp: 20 20 20    Height:      Weight:      SpO2: 97% 94% 95%    Weight change:   Intake/Output Summary (Last 24 hours) at 10/04/13 0730 Last data filed at 10/04/13 0654  Gross per 24 hour  Intake   1433 ml  Output    650 ml  Net    783 ml   General: resting in bed in NAD HEENT: no gross abnormality, mucous membranes moist Cardiac: RRR, no rubs, murmurs or gallops Pulm: clear to auscultation bilaterally, moving normal volumes of air Abd: soft, nontender, nondistended, BS present Ext: warm and well perfused, no pedal edema Neuro: alert and oriented X3  Lab Results: Basic Metabolic Panel:  Recent Labs Lab 10/03/13 0546 10/04/13 0518  NA 137 141  K 4.1 3.7  CL 102 103  CO2 22 26  GLUCOSE 120* 106*  BUN 15 17  CREATININE 0.69 0.81  CALCIUM 9.2 8.6   CBC:  Recent Labs Lab 10/02/13 1700 10/03/13 0546 10/04/13 0518  WBC 11.8* 11.0* 9.1  NEUTROABS 10.6*  --   --   HGB 13.7 12.3* 11.2*  HCT 37.9* 34.7* 32.3*  MCV 90.2 90.6 91.2  PLT 311 328 293   Studies/Results: Dg Tibia/fibula Left Port  10/04/2013   CLINICAL DATA:  Known tib/fib fracture with fall and new shin pain.  EXAM: PORTABLE LEFT TIBIA AND FIBULA - 2 VIEW  COMPARISON:  10/02/2013  FINDINGS: Oblique fracture through the fibular head and neck, with moderate medial displacement.  Lateral tibial  plateau fracture is without interval displacement. 2 mm of fracture offset persists along the central articular surface of the lateral tibial plateau.  Lipohemarthrosis.  IMPRESSION: No interval displacement of tibial plateau and fibular head/neck fractures.   Electronically Signed   By: Jorje Guild M.D.   On: 10/04/2013 05:38   Medications: I have reviewed the patient's current medications. Scheduled Meds: . fentaNYL  25 mcg Transdermal Q72H  . heparin  5,000 Units Subcutaneous 3 times per day  . iloperidone  6 mg Oral Daily  . levothyroxine  75 mcg Oral QAC breakfast  . nicotine  14 mg Transdermal Daily  . pantoprazole  40 mg Oral Daily  . polyethylene glycol  17 g Oral Daily  . senna-docusate  1 tablet Oral QHS  . simvastatin  20 mg Oral q1800  . sodium chloride  3 mL Intravenous Q12H   Continuous Infusions: none PRN Meds:.sodium chloride, acetaminophen, acetaminophen, diazepam, ondansetron, oxyCODONE, sodium chloride  Assessment/Plan: 48 yo white male with PMH of Bipolar disorder, chronic back pain, GERD, hypothyroidism and HLD who presents after several falls after recent tibial plateau fracture and inability to use crutches.     Tibial plateau fracture/ Multiple falls/ Alteration in self-care ability - he has  chronic pain issues and is unable to ambulate with crutches and has had falls 2/2 to leg fracture/immobility.  PT recommends SNF and patient is agreeable to short-term stay. - will need follow up with orthopaedics for surgery, no indication for emergent surgery  - pain Control with Fentanyl patch, Oxycodone PRN  - PT eval and treat - recommends SNF and patient is now agreeable - social work is assisting with finding SNF  Mild Leukocytosis  - WBC of 11.8 --> 9.1, patient afebrile.  - monitor CBC  Increased AG, resolved - AG 17--> 12.  No signs or symptoms of hypoperfusion or infection.  - monitor BMP  HYPERLIPIDEMIA  - continue Pravastatin 40mg    DISORDER, BIPOLAR  NOS - per patient, he is receiving samples of Fanapt (iloperidone) - atypical antipsychotic from his psychiatrist.  His wife brought in the bottle.  His pharmacy confirms that he is prescribed Valium 10mg  TID prn. - continue Fanapt and Valium  Constipation  - likely due to narcotic pain medication  - continue Miralax daily and Senokot-S QHS   CHRONIC BACK PAIN  - continue Fentanyl patch  - oxycodone PRN   GERD  - continue Nexium 40mg  daily   Hypothyroidism - continue home Levothyroxine   Cocaine/substance abuse - the patient has been caught saving pain pills; cocaine positive on UDS, no history of positive UDS for cocaine, denies use. - continue direct observation medication administration  DVT Ppx: Heparin  Diet: Regular  Code Status: Full   Dispo: He is stable for discharge to SNF today.  I spoke with [employer] and they will arrange follow-up appointment and contact the patient with the date and time.    The patient does have a current PCP Secundino Ginger, PA-C) and does need an [facility] hospital follow-up appointment after discharge.  The patient does not know have transportation limitations that hinder transportation to clinic appointments.  .Services Needed at time of discharge: Y = Yes, Blank = No PT:   OT:   RN:   Equipment:   Other:     LOS: 2 days   Travis Maxin, DO 10/04/2013, 7:30 AM

## 2013-10-04 NOTE — Progress Notes (Signed)
Patient called RN to the room, after he ambulated with the NT to the bathroom and back to bed. He stated that his left sock was stuck on the floor and he put weight through the left leg. He is now c/o sharp pain in the left knee. Dr. Redmond Pulling was paged and made aware. Reassurance will be given to the patient that he will continue to have pain in this area during this acute period of having suffered a tibia plateau fracture. Will continue to monitor.

## 2013-10-04 NOTE — Progress Notes (Signed)
Occupational Therapy Evaluation Patient Details Name: [name] MRN: [id number] DOB: 29-Jan-1966 Today's Date: 10/04/2013    History of Present Illness pt presents with L Tibial Plateau fx on Friday 5/1 and has 2 falls at home while trying to get around with his crutches.     Clinical Impression   PTA pt lived at home alone and was independent with ADLs and functional mobility prior to his fall on 09/30/13. Since his fall, pt has fell at home x2 with use of crutches. Educated pt on safety with use of DME, fall prevention strategies, and energy conservation techniques. Pt reports that he is receptive to going SNF, however not sure if pt will follow through. Feel that he would benefit from 24/7 supervision, which he does not have at home, to prevent further falls. Pt would benefit from continued skilled OT for safety awareness, functional mobility, and increased independence with ADLs.     Follow Up Recommendations  SNF;Supervision/Assistance - 24 hour    Equipment Recommendations  Tub/shower bench       Precautions / Restrictions Precautions Precautions: Fall Required Braces or Orthoses: Knee Immobilizer - Left Knee Immobilizer - Left: On at all times Restrictions Weight Bearing Restrictions: Yes LLE Weight Bearing: Non weight bearing      Mobility Bed Mobility Overal bed mobility: Needs Assistance Bed Mobility: Supine to Sit     Supine to sit: HOB elevated;Min guard (use of bed rail)        Transfers Overall transfer level: Needs assistance Equipment used: Rolling walker (2 wheeled) Transfers: Sit to/from [facility] Sit to Stand: Min guard Stand pivot transfers: Min guard       General transfer comment: VC's for hand placement during sit<>stand and reminder of NWB on LLE.     Balance Overall balance assessment: Needs assistance Sitting-balance support: No upper extremity supported;Feet supported Sitting balance-Leahy Scale: Good     Standing  balance support: Bilateral upper extremity supported;During functional activity Standing balance-Leahy Scale: Poor Standing balance comment: Pt requires UE A to maintain balance and NWBing.                             ADL Overall ADL's : Needs assistance/impaired Eating/Feeding: Independent;Sitting   Grooming: Min guard;Standing   Upper Body Bathing: Set up;Sitting   Lower Body Bathing: Min guard;Sit to/from stand;With adaptive equipment   Upper Body Dressing : Set up;Sitting   Lower Body Dressing: Min guard;With adaptive equipment;Sit to/from stand   Toilet Transfer: Min guard;Cueing for safety;Cueing for sequencing;Ambulation;BSC;RW (sit<>stand)   Toileting- Clothing Manipulation and Hygiene: Min guard;Sit to/from stand   Tub/ Shower Transfer: Tub transfer;Tub bench;Rolling walker;Min guard;Ambulation (sit<>stand)   Functional mobility during ADLs: Min guard;Rolling walker General ADL Comments: Educated pt on use of AE for LB ADLs with return demonstration. Pt reports that he has a tub at home and feel that safest option for pt would be a tub bench. Practiced tub transfer using tub bench in rehab gym and pt was min Guard level. Will communicate with CM to determine coverage of tub bench. Pt reports he is receptive to going to SNF however, unsure whether pt will follow through with this. Educated pt on safety at home with use of DME including functional mobility and transfers, energy conservation, and carrying items with walker bag.     Vision  Per pt report, no change from baseline.  Perception Perception Perception Tested?: No   Praxis Praxis Praxis tested?: Within functional limits    Pertinent Vitals/Pain No c/o pain. RN notified OT at start of session that pt was premedicated and is not due for medication for a while.      Hand Dominance Right   Extremity/Trunk Assessment Upper Extremity Assessment Upper Extremity Assessment:  Overall WFL for tasks assessed (pt reports weakness in Bil UEs, however MMT was [facility])   Lower Extremity Assessment Lower Extremity Assessment: Defer to PT evaluation   Cervical / Trunk Assessment Cervical / Trunk Assessment: Normal   Communication Communication Communication: No difficulties   Cognition Arousal/Alertness: Awake/alert Behavior During Therapy: WFL for tasks assessed/performed Overall Cognitive Status: Within Functional Limits for tasks assessed                                Home Living Family/patient expects to be discharged to:: Private residence Living Arrangements: Alone Available Help at Discharge: Family;Available PRN/intermittently (ex-wife and 23 y.o daughter) Type of Home: Apartment Home Access: Level entry     Home Layout: One level     Bathroom Shower/Tub: Tub/shower unit Shower/tub characteristics: Curtain Biochemist, clinical: Handicapped height Bathroom Accessibility: Yes How Accessible: Accessible via walker Home Equipment: Crutches   Additional Comments: Pt lives alone and reports that ex-wife and daughter are available to help intermittent. However, feel that pt will need 24/7 supervision to prevent further falls.       Prior Functioning/Environment Level of Independence: Independent        Comments: Independent prior to inital fall on 5/1.      OT Diagnosis: Generalized weakness;Acute pain   OT Problem List: Decreased strength;Decreased range of motion;Impaired balance (sitting and/or standing);Decreased safety awareness;Decreased knowledge of use of DME or AE;Decreased knowledge of precautions;Pain;Impaired UE functional use   OT Treatment/Interventions: Self-care/ADL training;Therapeutic exercise;Energy conservation;DME and/or AE instruction;Therapeutic activities;Patient/family education;Balance training    OT Goals(Current goals can be found in the care plan section) Acute Rehab OT Goals Patient Stated Goal: To go home OT  Goal Formulation: With patient Time For Goal Achievement: 10/11/13 Potential to Achieve Goals: Good ADL Goals Pt Will Perform Grooming: with modified independence;standing Pt Will Perform Lower Body Bathing: with modified independence;with adaptive equipment;sit to/from stand Pt Will Perform Lower Body Dressing: with modified independence;with adaptive equipment;sit to/from stand Pt Will Transfer to Toilet: with modified independence;ambulating;bedside commode (sit<>stand with RW) Pt Will Perform Toileting - Clothing Manipulation and hygiene: with modified independence;sit to/from stand Pt Will Perform Tub/Shower Transfer: Tub transfer;with modified independence;ambulating;tub bench;rolling walker  OT Frequency: Min 2X/week   Barriers to D/C: Decreased caregiver support             End of Session Equipment Utilized During Treatment: Gait belt;Rolling walker;Left knee immobilizer;Other (comment) (tub transfer bench, AE (hip kit))  Activity Tolerance: Patient tolerated treatment well Patient left: in chair;with call bell/phone within reach   Time: 0840-0930 OT Time Calculation (min): 50 min Charges:  OT General Charges $OT Visit: 1 Procedure OT Evaluation $Initial OT Evaluation Tier I: 1 Procedure OT Treatments $Self Care/Home Management : 38-52 mins G-Codes: OT G-codes **NOT FOR INPATIENT CLASS** Functional Assessment Tool Used: clinical judgement Functional Limitation: Self care Self Care Current Status (K8127): At least 20 percent but less than 40 percent impaired, limited or restricted Self Care Goal Status (N1700): At least 1 percent but less than 20 percent impaired, limited or restricted   [name] [phone]  10/04/2013, 10:44 AM

## 2013-10-05 DIAGNOSIS — S82109A Unspecified fracture of upper end of unspecified tibia, initial encounter for closed fracture: Secondary | ICD-10-CM | POA: Diagnosis not present

## 2013-10-05 MED ORDER — OXYCODONE HCL 5 MG PO CAPS: 5.0000 mg | ORAL_CAPSULE | ORAL | Status: DC | PRN

## 2013-10-05 MED ORDER — FENTANYL 25 MCG/HR TD PT72: 25.0000 ug | MEDICATED_PATCH | TRANSDERMAL | Status: DC

## 2013-10-05 MED ORDER — DIAZEPAM 10 MG PO TABS: 10.0000 mg | ORAL_TABLET | Freq: Three times a day (TID) | ORAL | Status: DC | PRN

## 2013-10-05 NOTE — Progress Notes (Signed)
Patient discharged to SNF. Report called to Antionette, RN. IV was removed and patient left unit in a stable condition via EMS.

## 2013-10-05 NOTE — Progress Notes (Signed)
Physical Therapy Treatment Patient Details Name: [name] MRN: [id number] DOB: May 09, 1966 Today's Date: 10/05/2013    History of Present Illness pt presents with L Tibial Plateau fx on Friday 5/1 and has 2 falls at home while trying to get around with his crutches.  pt with fall trying to get to bathroom by himself on 5/4.      PT Comments    Patient continues to require cues for NWBing and safe mobility. Continue to recommend SNF for overall safety as patient lives alone in small apartment and has fallen in the past.   Follow Up Recommendations  SNF     Equipment Recommendations  Rolling walker with 5" wheels    Recommendations for Other Services       Precautions / Restrictions Precautions Precautions: Fall Required Braces or Orthoses: Knee Immobilizer - Left Knee Immobilizer - Left: On at all times Restrictions LLE Weight Bearing: Non weight bearing    Mobility  Bed Mobility Overal bed mobility: Needs Assistance       Supine to sit: Supervision Sit to supine: Supervision   General bed mobility comments: A with L LE only.  pt uses bedrail for coming up to sitting.    Transfers Overall transfer level: Needs assistance Equipment used: Rolling walker (2 wheeled)   Sit to Stand: Min guard         General transfer comment: Patient with good technique. Cues to maintain NWB  Ambulation/Gait Ambulation/Gait assistance: Min guard Ambulation Distance (Feet): 100 Feet   Gait Pattern/deviations: Step-to pattern     General Gait Details: Patient able to walk the first half of walk maintaining NWB, as patient fatigued, he began with PWBing despite cueing   Stairs            Wheelchair Mobility    Modified Rankin (Stroke Patients Only)       Balance                                    Cognition Arousal/Alertness: Awake/alert Behavior During Therapy: WFL for tasks assessed/performed Overall Cognitive Status: Within Functional  Limits for tasks assessed                      Exercises      General Comments        Pertinent Vitals/Pain 4/10 L Leg pain. patient repositioned for comfort     Home Living                      Prior Function            PT Goals (current goals can now be found in the care plan section) Progress towards PT goals: Progressing toward goals    Frequency  Min 5X/week    PT Plan Current plan remains appropriate    Co-evaluation             End of Session Equipment Utilized During Treatment: Gait belt;Left knee immobilizer Activity Tolerance: Patient tolerated treatment well Patient left: in bed;with call bell/phone within reach     Time: 0925-0940 PT Time Calculation (min): 15 min  Charges:                       G Codes:      Tonia Brooms Robinette 10/05/2013, 1:46 PM 10/05/2013 [place] PTA [phone] pager [phone] office

## 2013-10-05 NOTE — Progress Notes (Signed)
Clinical social worker assisted with patient discharge to skilled nursing facility, [facility].  CSW addressed all family questions and concerns. CSW copied chart and added all important documents. CSW also set up patient transportation with [facility]. Clinical Social Worker will sign off for now as social work intervention is no longer needed.   Lataya Varnell, MSW, LCSWA [phone] 

## 2013-10-05 NOTE — Progress Notes (Signed)
Patient seen and examined with Dr. Redmond Pulling. Agree with documentation as outlined in the note. Pt still with mild pain at fracture site. No CP, no sob, no fevers/chills   Extremities- brace + over L lower extremity  Lungs - clear to auscultation  Abdomen - soft, non tender, + bowel sounds  Cardiovascular- Heart sounds normal, Regular rate and rhythm   L tib/fib fracture  - No operative intervention for now per ortho. Will need repair once swelling subsides  - Outpatient ortho follow up with Dr. Tamera Punt  - Pt now agreeable to SNF. Will attempt placement today  - c/w pain control.   Bipolar disorder  - will resume home dose of Fanapt and refer to psych as outpatient  - C/w valium prn (home dose).  - patient with episode of concealing pills. Will need to have direct observed treatment with pain medications. Patient does require pain meds at this time given tib/fib fracture.   Leukocytosis  Resolved. Likely stress demargination.  Will monitor   Hypothyroidism  - continue with home dose of levothyroxine   Dispo  Pt stable for discharge today to SNF

## 2013-10-05 NOTE — Progress Notes (Signed)
Subjective: No acute overnight events.  Patient seen and examined this AM.  No new complaints, still has left leg pain.  Appetite good.  Agreeable to SNF placement.     Objective: Vital signs in last 24 hours: Filed Vitals:   10/04/13 0330 10/04/13 1331 10/04/13 2208 10/05/13 0526  BP: 90/52 103/65 107/55 103/56  Pulse:  78 78 68  Temp:  97.5 F (36.4 C) 98.2 F (36.8 C) 97.8 F (36.6 C)  TempSrc:  Oral Oral Oral  Resp:  18 19 19   Height:      Weight:      SpO2:  94% 97% 96%   Weight change:   Intake/Output Summary (Last 24 hours) at 10/05/13 0834 Last data filed at 10/05/13 0653  Gross per 24 hour  Intake   1260 ml  Output   1250 ml  Net     10 ml   General: resting in bed in NAD HEENT: no gross abnormality, mucous membranes moist Cardiac: RRR, no rubs, murmurs or gallops Pulm: clear to auscultation bilaterally, moving normal volumes of air Abd: soft, nontender, nondistended, BS present Ext: warm and well perfused, no pedal edema, left leg in immobilizer Neuro: alert and oriented X3  Lab Results: Basic Metabolic Panel:  Recent Labs Lab 10/03/13 0546 10/04/13 0518  NA 137 141  K 4.1 3.7  CL 102 103  CO2 22 26  GLUCOSE 120* 106*  BUN 15 17  CREATININE 0.69 0.81  CALCIUM 9.2 8.6   CBC:  Recent Labs Lab 10/02/13 1700 10/03/13 0546 10/04/13 0518  WBC 11.8* 11.0* 9.1  NEUTROABS 10.6*  --   --   HGB 13.7 12.3* 11.2*  HCT 37.9* 34.7* 32.3*  MCV 90.2 90.6 91.2  PLT 311 328 293   Studies/Results: Dg Tibia/fibula Left Port  10/04/2013   CLINICAL DATA:  Known tib/fib fracture with fall and new shin pain.  EXAM: PORTABLE LEFT TIBIA AND FIBULA - 2 VIEW  COMPARISON:  10/02/2013  FINDINGS: Oblique fracture through the fibular head and neck, with moderate medial displacement.  Lateral tibial plateau fracture is without interval displacement. 2 mm of fracture offset persists along the central articular surface of the lateral tibial plateau.  Lipohemarthrosis.   IMPRESSION: No interval displacement of tibial plateau and fibular head/neck fractures.   Electronically Signed   By: Jorje Guild M.D.   On: 10/04/2013 05:38   Medications: I have reviewed the patient's current medications. Scheduled Meds: . fentaNYL  25 mcg Transdermal Q72H  . heparin  5,000 Units Subcutaneous 3 times per day  . iloperidone  6 mg Oral Daily  . levothyroxine  75 mcg Oral QAC breakfast  . nicotine  14 mg Transdermal Daily  . pantoprazole  40 mg Oral Daily  . polyethylene glycol  17 g Oral Daily  . senna-docusate  1 tablet Oral QHS  . simvastatin  20 mg Oral q1800  . sodium chloride  3 mL Intravenous Q12H   Continuous Infusions: none PRN Meds:.sodium chloride, acetaminophen, acetaminophen, diazepam, ondansetron, oxyCODONE, sodium chloride  Assessment/Plan: 48 yo white male with PMH of Bipolar disorder, chronic back pain, GERD, hypothyroidism and HLD who presents after several falls after recent tibial plateau fracture and inability to use crutches.     Tibial plateau fracture/ Multiple falls/ Alteration in self-care ability - he has chronic pain issues and is unable to ambulate with crutches and has had falls 2/2 to leg fracture/immobility.  PT recommends SNF and patient is agreeable to short-term stay. -  will need follow up with orthopaedics for surgery, no indication for emergent surgery  - pain control with Fentanyl patch, Oxycodone PRN  - PT eval and treat - recommends SNF and patient is now agreeable - appreciate social work help with securing SNF placement  Mild Leukocytosis, resolved - WBC of 11.8 --> 9.1, patient afebrile.   Increased AG, resolved - AG 17--> 12.  No signs or symptoms of hypoperfusion or infection.   HYPERLIPIDEMIA  - continue Pravastatin 40mg    DISORDER, BIPOLAR NOS - per patient, he is receiving samples of Fanapt (iloperidone) - atypical antipsychotic from his psychiatrist.  His wife brought in the bottle.  His pharmacy confirms that he  is prescribed Valium 10mg  TID prn. - continue Fanapt and Valium while inpatient  Constipation  - likely due to narcotic pain medication  - continue Miralax daily and Senokot-S QHS   CHRONIC BACK PAIN  - continue Fentanyl patch  - oxycodone PRN   GERD  - continue Nexium 40mg  daily   Hypothyroidism - continue home Levothyroxine   Cocaine/substance abuse - the patient has been caught saving pain pills; cocaine positive on UDS, no history of positive UDS for cocaine, denies use. - continue direct observation medication administration  DVT Ppx: Heparin  Diet: Regular  Code Status: Full   Dispo: He is stable for discharge to SNF today.  I spoke with [employer] and they will arrange follow-up appointment and contact the patient with the date and time.    The patient does have a current PCP Secundino Ginger, PA-C) and does need an [facility] hospital follow-up appointment after discharge.  The patient does not know have transportation limitations that hinder transportation to clinic appointments.  .Services Needed at time of discharge: Y = Yes, Blank = No PT:   OT:   RN:   Equipment:   Other:     LOS: 3 days   Duwaine Maxin, DO 10/05/2013, 8:34 AM

## 2013-10-06 ENCOUNTER — Encounter (HOSPITAL_COMMUNITY): Payer: Self-pay | Admitting: Emergency Medicine

## 2013-10-06 ENCOUNTER — Emergency Department (HOSPITAL_COMMUNITY)
Admission: EM | Admit: 2013-10-06 | Discharge: 2013-10-06 | Disposition: A | Discharge status: Home / Self Care | Payer: 59 | Attending: Emergency Medicine | Admitting: Emergency Medicine

## 2013-10-06 ENCOUNTER — Emergency Department (HOSPITAL_COMMUNITY): Payer: 59

## 2013-10-06 DIAGNOSIS — E785 Hyperlipidemia, unspecified: Secondary | ICD-10-CM | POA: Insufficient documentation

## 2013-10-06 DIAGNOSIS — Y9389 Activity, other specified: Secondary | ICD-10-CM | POA: Insufficient documentation

## 2013-10-06 DIAGNOSIS — F17200 Nicotine dependence, unspecified, uncomplicated: Secondary | ICD-10-CM | POA: Insufficient documentation

## 2013-10-06 DIAGNOSIS — E039 Hypothyroidism, unspecified: Secondary | ICD-10-CM | POA: Insufficient documentation

## 2013-10-06 DIAGNOSIS — R296 Repeated falls: Secondary | ICD-10-CM | POA: Insufficient documentation

## 2013-10-06 DIAGNOSIS — S82409A Unspecified fracture of shaft of unspecified fibula, initial encounter for closed fracture: Secondary | ICD-10-CM | POA: Insufficient documentation

## 2013-10-06 DIAGNOSIS — F411 Generalized anxiety disorder: Secondary | ICD-10-CM | POA: Insufficient documentation

## 2013-10-06 DIAGNOSIS — S82109A Unspecified fracture of upper end of unspecified tibia, initial encounter for closed fracture: Principal | ICD-10-CM

## 2013-10-06 DIAGNOSIS — G8929 Other chronic pain: Secondary | ICD-10-CM | POA: Insufficient documentation

## 2013-10-06 DIAGNOSIS — Z8739 Personal history of other diseases of the musculoskeletal system and connective tissue: Secondary | ICD-10-CM | POA: Insufficient documentation

## 2013-10-06 DIAGNOSIS — S82839A Other fracture of upper and lower end of unspecified fibula, initial encounter for closed fracture: Secondary | ICD-10-CM

## 2013-10-06 DIAGNOSIS — Y929 Unspecified place or not applicable: Secondary | ICD-10-CM | POA: Insufficient documentation

## 2013-10-06 DIAGNOSIS — S59919A Unspecified injury of unspecified forearm, initial encounter: Secondary | ICD-10-CM

## 2013-10-06 DIAGNOSIS — S82143A Displaced bicondylar fracture of unspecified tibia, initial encounter for closed fracture: Secondary | ICD-10-CM

## 2013-10-06 DIAGNOSIS — Z79899 Other long term (current) drug therapy: Secondary | ICD-10-CM | POA: Insufficient documentation

## 2013-10-06 DIAGNOSIS — S59909A Unspecified injury of unspecified elbow, initial encounter: Secondary | ICD-10-CM | POA: Insufficient documentation

## 2013-10-06 DIAGNOSIS — F319 Bipolar disorder, unspecified: Secondary | ICD-10-CM | POA: Insufficient documentation

## 2013-10-06 DIAGNOSIS — S6990XA Unspecified injury of unspecified wrist, hand and finger(s), initial encounter: Secondary | ICD-10-CM

## 2013-10-06 DIAGNOSIS — K219 Gastro-esophageal reflux disease without esophagitis: Secondary | ICD-10-CM | POA: Insufficient documentation

## 2013-10-06 MED ORDER — ONDANSETRON 4 MG PO TBDP
8.0000 mg | ORAL_TABLET | Freq: Once | ORAL | Status: AC
Start: 2013-10-06 — End: 2013-10-06
  Administered 2013-10-06: 8 mg via ORAL
  Filled 2013-10-06: qty 2

## 2013-10-06 MED ORDER — HYDROMORPHONE HCL PF 1 MG/ML IJ SOLN
2.0000 mg | Freq: Once | INTRAMUSCULAR | Status: AC
  Administered 2013-10-06: 2 mg via INTRAMUSCULAR
  Filled 2013-10-06 (×2): qty 2

## 2013-10-06 NOTE — ED Provider Notes (Signed)
CSN: [id number]     Arrival date & time 10/06/13  1849 History   First MD Initiated Contact with Patient 10/06/13 1856     Chief Complaint  Patient presents with  . Fall     (Consider location/radiation/quality/duration/timing/severity/associated sxs/prior Treatment) HPI Comments: Patient presents to the ER for evaluation of left knee injury. Patient reports he had 2 falls a week ago and suffered a fracture of the tibia/fibular region. He is pending surgery for this, is currently in rehabilitation while waiting for the swelling to go down. Patient tried to stand up today, wearing his knee immobilizer, lost his balance and fell, reinjuring the knee. He also injured his left elbow, but the pain there is only minor. He is complaining of moderate to severe pain in the knee region. The his head or lose consciousness. No neck or back pain.  Patient is a 48 y.o. male presenting with fall.  Fall Pertinent negatives include no headaches.    Past Medical History  Diagnosis Date  . Anxiety   . Bipolar affective disorder   . Chronic back pain   . DDD (degenerative disc disease)   . DJD (degenerative joint disease)   . GERD (gastroesophageal reflux disease)   . Head injury   . Hyperlipidemia   . Hypothyroid   . IBS (irritable bowel syndrome)    Past Surgical History  Procedure Laterality Date  . Back surgery    . Septoplasty     History reviewed. No pertinent family history. History  Substance Use Topics  . Smoking status: Current Every Day Smoker -- 1.00 packs/day for 33 years    Types: Cigarettes  . Smokeless tobacco: Never Used  . Alcohol Use: No    Review of Systems  Musculoskeletal: Positive for arthralgias.  Neurological: Negative for dizziness, syncope and headaches.  All other systems reviewed and are negative.     Allergies  Zolpidem tartrate and Demerol  Home Medications   Prior to Admission medications   Medication Sig Start Date End Date Taking? Authorizing  Provider  diazepam (VALIUM) 10 MG tablet Take 1 tablet (10 mg total) by mouth every 8 (eight) hours as needed for anxiety. 10/05/13  Yes Duwaine Maxin, DO  esomeprazole (NEXIUM) 40 MG capsule Take 40 mg by mouth at bedtime.   Yes Historical Provider, MD  fentaNYL (DURAGESIC - DOSED MCG/HR) 25 MCG/HR patch Place 1 patch (25 mcg total) onto the skin every 3 (three) days. 10/05/13  Yes Duwaine Maxin, DO  Iloperidone (FANAPT) 6 MG TABS Take 6 mg by mouth at bedtime.   Yes Historical Provider, MD  levothyroxine (SYNTHROID, LEVOTHROID) 75 MCG tablet Take 75 mcg by mouth every evening.   Yes Historical Provider, MD  nicotine (NICODERM CQ - DOSED IN MG/24 HOURS) 21 mg/24hr patch Place 21 mg onto the skin daily.   Yes Historical Provider, MD  oxyCODONE (ROXICODONE) 15 MG immediate release tablet Take 15 mg by mouth 4 (four) times daily.   Yes Historical Provider, MD  pravastatin (PRAVACHOL) 40 MG tablet Take 40 mg by mouth at bedtime.    Yes Historical Provider, MD  nitroGLYCERIN (NITROSTAT) 0.4 MG SL tablet Place 1 tablet (0.4 mg total) under the tongue every 5 (five) minutes as needed for chest pain. 12/26/12   Sheila Oats, MD   BP 124/76  Pulse 86  Temp(Src) 97.5 F (36.4 C) (Oral)  Resp 18  SpO2 98% Physical Exam  Constitutional: He is oriented to person, place, and time. He appears well-developed  and well-nourished. No distress.  HENT:  Head: Normocephalic and atraumatic.  Right Ear: Hearing normal.  Left Ear: Hearing normal.  Nose: Nose normal.  Mouth/Throat: Oropharynx is clear and moist and mucous membranes are normal.  Eyes: Conjunctivae and EOM are normal. Pupils are equal, round, and reactive to light.  Neck: Normal range of motion. Neck supple.  Cardiovascular: Regular rhythm, S1 normal and S2 normal.  Exam reveals no gallop and no friction rub.   No murmur heard. Pulmonary/Chest: Effort normal and breath sounds normal. No respiratory distress. He exhibits no tenderness.  Abdominal:  Soft. Normal appearance and bowel sounds are normal. There is no hepatosplenomegaly. There is no tenderness. There is no rebound, no guarding, no tenderness at McBurney's point and negative Murphy's sign. No hernia.  Musculoskeletal:       Left elbow: He exhibits normal range of motion, no swelling, no effusion and no deformity. Tenderness found.       Left knee: He exhibits decreased range of motion, swelling and effusion. He exhibits no deformity. Tenderness (diffuse) found.  Neurological: He is alert and oriented to person, place, and time. He has normal strength. No cranial nerve deficit or sensory deficit. Coordination normal. GCS eye subscore is 4. GCS verbal subscore is 5. GCS motor subscore is 6.  Skin: Skin is warm, dry and intact. No rash noted. No cyanosis.  Psychiatric: He has a normal mood and affect. His speech is normal and behavior is normal. Thought content normal.    ED Course  Procedures (including critical care time) Labs Review Labs Reviewed - No data to display  Imaging Review No results found.   EKG Interpretation None      MDM   Final diagnoses:  None   Patient presents to the ER for evaluation after a fall. He has a known tibial plateau fracture with proximal fibula fracture. X-ray appears unchanged. Patient treated with analgesia, continue knee immobilizer, followup with orhtopedic surgery as previously scheduled.   Orpah Greek, MD 10/06/13 2014

## 2013-10-06 NOTE — ED Notes (Signed)
EMS- Pt fell 2 times this week and fx tib/fib. Today pt fell again and is c/o increase in pain and stiffness in left shoulder. Pt from [facility]. Denies loc or cervical pain.

## 2013-10-06 NOTE — Discharge Summary (Signed)
I agree with documentation and treatment plan as outlined in Dr. Dois Davenport note.   Pt to follow up with [facility] for possible surgical repair of the tib/fib fracture Outpatient psych follow up for bipolar disorder

## 2013-10-06 NOTE — Discharge Instructions (Signed)
Tibial Fracture A tibial fracture is a break in your tibia bone. The tibia is the large shinbone in your lower leg. The bone will be held in place with a cast or splint until it is healed. HOME CARE  Put ice on the injured area.  Put ice in a plastic bag.  Place a towel between your skin and the bag.  Leave the ice on for 15-20 minutes, 03-04 times a day, for 2 days.  If you have a cast:  Do not scratch under the cast.  Check the skin around the cast every day. You may put lotion on any red or sore areas.  Keep your cast dry and clean.  If you have a splint:  Wear the splint as told by your doctor.  Loosen the elastic around the splint if your toes get numb, tingle, or turn cold or blue.  Do not put pressure on the cast or splint until it is hard.  Do not put the cast or splint in water. Cover it with a plastic bag when bathing.  Use crutches as told by your doctor.  Only take medicine as told by your doctor.  See your doctor as told for follow-up visits. GET HELP RIGHT AWAY IF:  Pain gets worse or is not controlled with medicine.  Puffiness (swelling) or redness gets worse.  You start to lose feeling in your foot or toes.  Your foot or toes get cold or blue.  You have pain in your leg, especially if it gets worse when you move your toes. MAKE SURE YOU:  Understand these instructions.  Will watch your condition.  Will get help right away if you are not doing well or get worse. Document Released: 06/21/2010 Document Revised: 08/11/2011 Document Reviewed: 06/21/2010 [facility] Patient Information 2014 [place], [place].

## 2013-10-07 NOTE — ED Notes (Signed)
Pt's nurse calling from [facility] facility to see what time pt's last pain med was given.

## 2013-10-19 ENCOUNTER — Encounter (HOSPITAL_COMMUNITY): Payer: Self-pay | Admitting: *Deleted

## 2013-10-19 MED ORDER — CEFAZOLIN SODIUM-DEXTROSE 2-3 GM-% IV SOLR
2.0000 g | INTRAVENOUS | Status: AC
  Administered 2013-10-20: 2 g via INTRAVENOUS
  Filled 2013-10-19: qty 50

## 2013-10-19 MED ORDER — ACETAMINOPHEN 500 MG PO TABS
1000.0000 mg | ORAL_TABLET | Freq: Once | ORAL | Status: AC
  Administered 2013-10-20: 1000 mg via ORAL
  Filled 2013-10-19: qty 2

## 2013-10-20 ENCOUNTER — Encounter (HOSPITAL_COMMUNITY): Payer: 59 | Admitting: Anesthesiology

## 2013-10-20 ENCOUNTER — Encounter (HOSPITAL_COMMUNITY)
Admission: RE | Disposition: A | Discharge status: Home Health | Payer: Self-pay | Source: Ambulatory Visit | Attending: Orthopedic Surgery

## 2013-10-20 ENCOUNTER — Encounter (HOSPITAL_COMMUNITY): Payer: Self-pay | Admitting: *Deleted

## 2013-10-20 ENCOUNTER — Inpatient Hospital Stay (HOSPITAL_COMMUNITY): Payer: 59 | Admitting: Anesthesiology

## 2013-10-20 ENCOUNTER — Inpatient Hospital Stay (HOSPITAL_COMMUNITY)
Admission: RE | Admit: 2013-10-20 | Discharge: 2013-10-23 | DRG: 494 | Disposition: A | Discharge status: Home Health | Payer: 59 | Source: Ambulatory Visit | Attending: Orthopedic Surgery | Admitting: Orthopedic Surgery

## 2013-10-20 ENCOUNTER — Inpatient Hospital Stay (HOSPITAL_COMMUNITY): Payer: 59

## 2013-10-20 DIAGNOSIS — F411 Generalized anxiety disorder: Secondary | ICD-10-CM | POA: Diagnosis present

## 2013-10-20 DIAGNOSIS — F1410 Cocaine abuse, uncomplicated: Secondary | ICD-10-CM | POA: Diagnosis present

## 2013-10-20 DIAGNOSIS — Z79899 Other long term (current) drug therapy: Secondary | ICD-10-CM

## 2013-10-20 DIAGNOSIS — S82143A Displaced bicondylar fracture of unspecified tibia, initial encounter for closed fracture: Secondary | ICD-10-CM | POA: Diagnosis present

## 2013-10-20 DIAGNOSIS — M549 Dorsalgia, unspecified: Secondary | ICD-10-CM

## 2013-10-20 DIAGNOSIS — G8929 Other chronic pain: Secondary | ICD-10-CM | POA: Diagnosis present

## 2013-10-20 DIAGNOSIS — E785 Hyperlipidemia, unspecified: Secondary | ICD-10-CM | POA: Diagnosis present

## 2013-10-20 DIAGNOSIS — Z9689 Presence of other specified functional implants: Secondary | ICD-10-CM

## 2013-10-20 DIAGNOSIS — S82109A Unspecified fracture of upper end of unspecified tibia, initial encounter for closed fracture: Principal | ICD-10-CM | POA: Diagnosis present

## 2013-10-20 DIAGNOSIS — E039 Hypothyroidism, unspecified: Secondary | ICD-10-CM | POA: Diagnosis present

## 2013-10-20 DIAGNOSIS — F319 Bipolar disorder, unspecified: Secondary | ICD-10-CM | POA: Diagnosis present

## 2013-10-20 DIAGNOSIS — E032 Hypothyroidism due to medicaments and other exogenous substances: Secondary | ICD-10-CM | POA: Diagnosis present

## 2013-10-20 DIAGNOSIS — K219 Gastro-esophageal reflux disease without esophagitis: Secondary | ICD-10-CM | POA: Diagnosis present

## 2013-10-20 DIAGNOSIS — F17200 Nicotine dependence, unspecified, uncomplicated: Secondary | ICD-10-CM | POA: Diagnosis present

## 2013-10-20 DIAGNOSIS — T56891A Toxic effect of other metals, accidental (unintentional), initial encounter: Secondary | ICD-10-CM

## 2013-10-20 DIAGNOSIS — F3110 Bipolar disorder, current episode manic without psychotic features, unspecified: Secondary | ICD-10-CM | POA: Diagnosis present

## 2013-10-20 HISTORY — PX: ORIF TIBIA PLATEAU: SHX2132

## 2013-10-20 HISTORY — PX: FASCIOTOMY: SHX132

## 2013-10-20 HISTORY — DX: Displaced bicondylar fracture of left tibia, initial encounter for closed fracture: S82.142A

## 2013-10-20 HISTORY — DX: Unilateral inguinal hernia, without obstruction or gangrene, not specified as recurrent: K40.90

## 2013-10-20 LAB — URINALYSIS, ROUTINE W REFLEX MICROSCOPIC
Bilirubin Urine: NEGATIVE
GLUCOSE, UA: NEGATIVE mg/dL
Hgb urine dipstick: NEGATIVE
KETONES UR: NEGATIVE mg/dL
LEUKOCYTES UA: NEGATIVE
NITRITE: NEGATIVE
PROTEIN: NEGATIVE mg/dL
Specific Gravity, Urine: 1.02 (ref 1.005–1.030)
Urobilinogen, UA: 1 mg/dL (ref 0.0–1.0)
pH: 6 (ref 5.0–8.0)

## 2013-10-20 LAB — CBC WITH DIFFERENTIAL/PLATELET
BASOS PCT: 1 % (ref 0–1)
Basophils Absolute: 0 10*3/uL (ref 0.0–0.1)
EOS ABS: 0.1 10*3/uL (ref 0.0–0.7)
EOS PCT: 2 % (ref 0–5)
HCT: 43 % (ref 39.0–52.0)
Hemoglobin: 15 g/dL (ref 13.0–17.0)
Lymphocytes Relative: 20 % (ref 12–46)
Lymphs Abs: 1.6 10*3/uL (ref 0.7–4.0)
MCH: 31.8 pg (ref 26.0–34.0)
MCHC: 34.9 g/dL (ref 30.0–36.0)
MCV: 91.3 fL (ref 78.0–100.0)
Monocytes Absolute: 0.6 10*3/uL (ref 0.1–1.0)
Monocytes Relative: 8 % (ref 3–12)
NEUTROS PCT: 71 % (ref 43–77)
Neutro Abs: 5.7 10*3/uL (ref 1.7–7.7)
PLATELETS: 320 10*3/uL (ref 150–400)
RBC: 4.71 MIL/uL (ref 4.22–5.81)
RDW: 12.5 % (ref 11.5–15.5)
WBC: 8.1 10*3/uL (ref 4.0–10.5)

## 2013-10-20 LAB — RAPID URINE DRUG SCREEN, HOSP PERFORMED
Amphetamines: NOT DETECTED
BENZODIAZEPINES: POSITIVE — AB
Barbiturates: NOT DETECTED
COCAINE: NOT DETECTED
Opiates: NOT DETECTED
TETRAHYDROCANNABINOL: NOT DETECTED

## 2013-10-20 LAB — COMPREHENSIVE METABOLIC PANEL
ALBUMIN: 3.8 g/dL (ref 3.5–5.2)
ALK PHOS: 136 U/L — AB (ref 39–117)
ALT: 13 U/L (ref 0–53)
AST: 16 U/L (ref 0–37)
BUN: 13 mg/dL (ref 6–23)
CALCIUM: 9.8 mg/dL (ref 8.4–10.5)
CO2: 23 mEq/L (ref 19–32)
Chloride: 104 mEq/L (ref 96–112)
Creatinine, Ser: 0.85 mg/dL (ref 0.50–1.35)
GFR calc non Af Amer: >90 mL/min (ref >90)
Glucose, Bld: 93 mg/dL (ref 70–99)
POTASSIUM: 4.2 meq/L (ref 3.7–5.3)
Sodium: 140 mEq/L (ref 137–147)
TOTAL PROTEIN: 7.4 g/dL (ref 6.0–8.3)
Total Bilirubin: 0.3 mg/dL (ref 0.3–1.2)

## 2013-10-20 LAB — PROTIME-INR
INR: 0.97 (ref 0.00–1.49)
Prothrombin Time: 12.7 seconds (ref 11.6–15.2)

## 2013-10-20 LAB — TYPE AND SCREEN
ABO/RH(D): O POS
Antibody Screen: NEGATIVE

## 2013-10-20 LAB — APTT: APTT: 33 s (ref 24–37)

## 2013-10-20 LAB — ABO/RH: ABO/RH(D): O POS

## 2013-10-20 SURGERY — OPEN REDUCTION INTERNAL FIXATION (ORIF) TIBIAL PLATEAU
Anesthesia: General | Laterality: Left

## 2013-10-20 MED ORDER — CEFAZOLIN SODIUM 1-5 GM-% IV SOLN
1.0000 g | Freq: Four times a day (QID) | INTRAVENOUS | Status: AC
  Administered 2013-10-20 – 2013-10-21 (×3): 1 g via INTRAVENOUS
  Filled 2013-10-20 (×4): qty 50

## 2013-10-20 MED ORDER — ROCURONIUM BROMIDE 50 MG/5ML IV SOLN
INTRAVENOUS | Status: AC
  Filled 2013-10-20: qty 1

## 2013-10-20 MED ORDER — GLYCOPYRROLATE 0.2 MG/ML IJ SOLN
INTRAMUSCULAR | Status: DC | PRN
  Administered 2013-10-20: 0.2 mg via INTRAVENOUS

## 2013-10-20 MED ORDER — OXYCODONE HCL 5 MG PO TABS: 5.0000 mg | ORAL_TABLET | Freq: Once | ORAL | Status: DC | PRN

## 2013-10-20 MED ORDER — HYDROMORPHONE HCL PF 1 MG/ML IJ SOLN
INTRAMUSCULAR | Status: DC | PRN
  Administered 2013-10-20: 1 mg via INTRAVENOUS

## 2013-10-20 MED ORDER — ONDANSETRON HCL 4 MG/2ML IJ SOLN
INTRAMUSCULAR | Status: DC | PRN
  Administered 2013-10-20: 4 mg via INTRAVENOUS

## 2013-10-20 MED ORDER — HYDROMORPHONE HCL PF 1 MG/ML IJ SOLN
INTRAMUSCULAR | Status: AC
  Filled 2013-10-20: qty 2

## 2013-10-20 MED ORDER — ONDANSETRON HCL 4 MG PO TABS: 4.0000 mg | ORAL_TABLET | Freq: Four times a day (QID) | ORAL | Status: DC | PRN

## 2013-10-20 MED ORDER — OXYCODONE HCL 5 MG/5ML PO SOLN: 5.0000 mg | Freq: Once | ORAL | Status: DC | PRN

## 2013-10-20 MED ORDER — PROPOFOL 10 MG/ML IV BOLUS
INTRAVENOUS | Status: DC | PRN
  Administered 2013-10-20: 200 mg via INTRAVENOUS

## 2013-10-20 MED ORDER — LACTATED RINGERS IV SOLN
INTRAVENOUS | Status: DC | PRN
  Administered 2013-10-20 (×2): via INTRAVENOUS

## 2013-10-20 MED ORDER — FENTANYL CITRATE 0.05 MG/ML IJ SOLN
INTRAMUSCULAR | Status: AC
  Filled 2013-10-20: qty 5

## 2013-10-20 MED ORDER — DOCUSATE SODIUM 100 MG PO CAPS
100.0000 mg | ORAL_CAPSULE | Freq: Two times a day (BID) | ORAL | Status: DC
  Administered 2013-10-20 – 2013-10-23 (×6): 100 mg via ORAL
  Filled 2013-10-20 (×6): qty 1

## 2013-10-20 MED ORDER — HYDROMORPHONE HCL PF 1 MG/ML IJ SOLN
INTRAMUSCULAR | Status: AC
  Filled 2013-10-20: qty 1

## 2013-10-20 MED ORDER — GLYCOPYRROLATE 0.2 MG/ML IJ SOLN
INTRAMUSCULAR | Status: AC
  Filled 2013-10-20: qty 2

## 2013-10-20 MED ORDER — DEXTROSE 5 % IV SOLN
500.0000 mg | Freq: Four times a day (QID) | INTRAVENOUS | Status: DC | PRN
  Filled 2013-10-20: qty 5

## 2013-10-20 MED ORDER — MIDAZOLAM HCL 5 MG/5ML IJ SOLN
INTRAMUSCULAR | Status: DC | PRN
  Administered 2013-10-20: 2 mg via INTRAVENOUS

## 2013-10-20 MED ORDER — NEOSTIGMINE METHYLSULFATE 10 MG/10ML IV SOLN
INTRAVENOUS | Status: AC
  Filled 2013-10-20: qty 1

## 2013-10-20 MED ORDER — SIMVASTATIN 20 MG PO TABS
20.0000 mg | ORAL_TABLET | Freq: Every day | ORAL | Status: DC
Start: 2013-10-21 — End: 2013-10-23
  Administered 2013-10-21 – 2013-10-22 (×2): 20 mg via ORAL
  Filled 2013-10-20 (×3): qty 1

## 2013-10-20 MED ORDER — KETOROLAC TROMETHAMINE 30 MG/ML IJ SOLN
15.0000 mg | Freq: Once | INTRAMUSCULAR | Status: AC | PRN
  Administered 2013-10-20: 30 mg via INTRAVENOUS

## 2013-10-20 MED ORDER — ILOPERIDONE 6 MG PO TABS
6.0000 mg | ORAL_TABLET | Freq: Every day | ORAL | Status: DC
  Administered 2013-10-21 – 2013-10-22 (×2): 6 mg via ORAL
  Filled 2013-10-20 (×2): qty 1

## 2013-10-20 MED ORDER — LACTATED RINGERS IV SOLN
INTRAVENOUS | Status: DC
  Administered 2013-10-20: 50 mL/h via INTRAVENOUS
  Administered 2013-10-21: 14:00:00 via INTRAVENOUS

## 2013-10-20 MED ORDER — PROPOFOL 10 MG/ML IV BOLUS
INTRAVENOUS | Status: AC
  Filled 2013-10-20: qty 20

## 2013-10-20 MED ORDER — MIDAZOLAM HCL 2 MG/2ML IJ SOLN
INTRAMUSCULAR | Status: AC
  Filled 2013-10-20: qty 2

## 2013-10-20 MED ORDER — OXYCODONE HCL 5 MG PO TABS
10.0000 mg | ORAL_TABLET | ORAL | Status: DC | PRN
  Administered 2013-10-20 – 2013-10-23 (×19): 10 mg via ORAL
  Filled 2013-10-20 (×21): qty 2

## 2013-10-20 MED ORDER — METOCLOPRAMIDE HCL 10 MG PO TABS: 5.0000 mg | ORAL_TABLET | Freq: Three times a day (TID) | ORAL | Status: DC | PRN

## 2013-10-20 MED ORDER — ONDANSETRON HCL 4 MG/2ML IJ SOLN: 4.0000 mg | Freq: Four times a day (QID) | INTRAMUSCULAR | Status: DC | PRN

## 2013-10-20 MED ORDER — MIDAZOLAM HCL 2 MG/2ML IJ SOLN
INTRAMUSCULAR | Status: AC
  Administered 2013-10-20: 1 mg via INTRAVENOUS
  Filled 2013-10-20: qty 2

## 2013-10-20 MED ORDER — ACETAMINOPHEN 500 MG PO TABS
1000.0000 mg | ORAL_TABLET | Freq: Three times a day (TID) | ORAL | Status: DC
  Administered 2013-10-20 – 2013-10-23 (×8): 1000 mg via ORAL
  Filled 2013-10-20 (×12): qty 2

## 2013-10-20 MED ORDER — LIDOCAINE HCL (CARDIAC) 20 MG/ML IV SOLN
INTRAVENOUS | Status: AC
  Filled 2013-10-20: qty 5

## 2013-10-20 MED ORDER — NEOSTIGMINE METHYLSULFATE 10 MG/10ML IV SOLN
INTRAVENOUS | Status: DC | PRN
  Administered 2013-10-20: 2 mg via INTRAVENOUS

## 2013-10-20 MED ORDER — ROCURONIUM BROMIDE 100 MG/10ML IV SOLN
INTRAVENOUS | Status: DC | PRN
  Administered 2013-10-20: 35 mg via INTRAVENOUS
  Administered 2013-10-20: 15 mg via INTRAVENOUS

## 2013-10-20 MED ORDER — ONDANSETRON HCL 4 MG/2ML IJ SOLN
INTRAMUSCULAR | Status: AC
  Filled 2013-10-20: qty 2

## 2013-10-20 MED ORDER — NITROGLYCERIN 0.4 MG SL SUBL: 0.4000 mg | SUBLINGUAL_TABLET | SUBLINGUAL | Status: DC | PRN

## 2013-10-20 MED ORDER — LIDOCAINE HCL (CARDIAC) 20 MG/ML IV SOLN
INTRAVENOUS | Status: DC | PRN
  Administered 2013-10-20: 100 mg via INTRAVENOUS

## 2013-10-20 MED ORDER — ENOXAPARIN SODIUM 40 MG/0.4ML ~~LOC~~ SOLN
40.0000 mg | SUBCUTANEOUS | Status: DC
Start: 2013-10-21 — End: 2013-10-23
  Administered 2013-10-21 – 2013-10-23 (×3): 40 mg via SUBCUTANEOUS
  Filled 2013-10-20 (×4): qty 0.4

## 2013-10-20 MED ORDER — BISACODYL 10 MG RE SUPP: 10.0000 mg | Freq: Every day | RECTAL | Status: DC | PRN

## 2013-10-20 MED ORDER — POLYETHYLENE GLYCOL 3350 17 G PO PACK: 17.0000 g | PACK | Freq: Every day | ORAL | Status: DC | PRN

## 2013-10-20 MED ORDER — HYDROMORPHONE HCL PF 1 MG/ML IJ SOLN
0.5000 mg | INTRAMUSCULAR | Status: DC | PRN
  Administered 2013-10-20 – 2013-10-23 (×21): 1 mg via INTRAVENOUS
  Filled 2013-10-20 (×21): qty 1

## 2013-10-20 MED ORDER — FENTANYL CITRATE 0.05 MG/ML IJ SOLN
INTRAMUSCULAR | Status: DC | PRN
  Administered 2013-10-20: 50 ug via INTRAVENOUS
  Administered 2013-10-20: 100 ug via INTRAVENOUS
  Administered 2013-10-20: 150 ug via INTRAVENOUS
  Administered 2013-10-20: 100 ug via INTRAVENOUS
  Administered 2013-10-20: 50 ug via INTRAVENOUS
  Administered 2013-10-20 (×2): 100 ug via INTRAVENOUS
  Administered 2013-10-20 (×2): 50 ug via INTRAVENOUS

## 2013-10-20 MED ORDER — ONDANSETRON HCL 4 MG/2ML IJ SOLN: 4.0000 mg | Freq: Once | INTRAMUSCULAR | Status: DC | PRN

## 2013-10-20 MED ORDER — LACTATED RINGERS IV SOLN
INTRAVENOUS | Status: DC
  Administered 2013-10-20: 10 mL/h via INTRAVENOUS

## 2013-10-20 MED ORDER — METOCLOPRAMIDE HCL 5 MG/ML IJ SOLN: 5.0000 mg | Freq: Three times a day (TID) | INTRAMUSCULAR | Status: DC | PRN

## 2013-10-20 MED ORDER — MIDAZOLAM HCL 2 MG/2ML IJ SOLN
1.0000 mg | INTRAMUSCULAR | Status: AC | PRN
  Administered 2013-10-20 (×2): 1 mg via INTRAVENOUS

## 2013-10-20 MED ORDER — HYDROMORPHONE HCL PF 1 MG/ML IJ SOLN
0.2500 mg | INTRAMUSCULAR | Status: DC | PRN
  Administered 2013-10-20 (×4): 0.5 mg via INTRAVENOUS

## 2013-10-20 MED ORDER — LEVOTHYROXINE SODIUM 75 MCG PO TABS
75.0000 ug | ORAL_TABLET | Freq: Every day | ORAL | Status: DC
  Administered 2013-10-20 – 2013-10-21 (×2): 75 ug via ORAL
  Filled 2013-10-20 (×2): qty 1

## 2013-10-20 MED ORDER — KETOROLAC TROMETHAMINE 30 MG/ML IJ SOLN
INTRAMUSCULAR | Status: AC
  Filled 2013-10-20: qty 1

## 2013-10-20 MED ORDER — METHOCARBAMOL 500 MG PO TABS
500.0000 mg | ORAL_TABLET | Freq: Four times a day (QID) | ORAL | Status: DC | PRN
  Administered 2013-10-21 – 2013-10-23 (×7): 500 mg via ORAL
  Filled 2013-10-20 (×8): qty 1

## 2013-10-20 MED ORDER — 0.9 % SODIUM CHLORIDE (POUR BTL) OPTIME
TOPICAL | Status: DC | PRN
  Administered 2013-10-20: 1000 mL

## 2013-10-20 MED ORDER — PANTOPRAZOLE SODIUM 40 MG PO TBEC
80.0000 mg | DELAYED_RELEASE_TABLET | Freq: Every day | ORAL | Status: DC
  Administered 2013-10-21: 40 mg via ORAL
  Administered 2013-10-22 – 2013-10-23 (×2): 80 mg via ORAL
  Filled 2013-10-20 (×2): qty 2

## 2013-10-20 MED ORDER — HYDROMORPHONE HCL PF 1 MG/ML IJ SOLN
0.5000 mg | INTRAMUSCULAR | Status: DC | PRN
  Administered 2013-10-20 (×2): 0.5 mg via INTRAVENOUS

## 2013-10-20 MED ORDER — FENTANYL 25 MCG/HR TD PT72
25.0000 ug | MEDICATED_PATCH | TRANSDERMAL | Status: DC
  Administered 2013-10-21: 25 ug via TRANSDERMAL
  Filled 2013-10-20: qty 1

## 2013-10-20 MED ORDER — CLONAZEPAM 1 MG PO TABS
1.0000 mg | ORAL_TABLET | Freq: Three times a day (TID) | ORAL | Status: DC
  Administered 2013-10-20 – 2013-10-23 (×8): 1 mg via ORAL
  Filled 2013-10-20 (×8): qty 1

## 2013-10-20 SURGICAL SUPPLY — 104 items
BANDAGE ELASTIC 4 VELCRO ST LF (GAUZE/BANDAGES/DRESSINGS) ×2 IMPLANT
BANDAGE ELASTIC 6 VELCRO ST LF (GAUZE/BANDAGES/DRESSINGS) ×2 IMPLANT
BANDAGE ESMARK 6X9 LF (GAUZE/BANDAGES/DRESSINGS) ×1 IMPLANT
BANDAGE GAUZE ELAST BULKY 4 IN (GAUZE/BANDAGES/DRESSINGS) ×2 IMPLANT
BLADE SURG 10 STRL SS (BLADE) ×2 IMPLANT
BLADE SURG 15 STRL LF DISP TIS (BLADE) IMPLANT
BLADE SURG 15 STRL SS (BLADE)
BLADE SURG ROTATE 9660 (MISCELLANEOUS) IMPLANT
BNDG COHESIVE 4X5 TAN STRL (GAUZE/BANDAGES/DRESSINGS) ×2 IMPLANT
BNDG ESMARK 6X9 LF (GAUZE/BANDAGES/DRESSINGS) ×2
BNDG GAUZE ELAST 4 BULKY (GAUZE/BANDAGES/DRESSINGS) ×2 IMPLANT
BRUSH SCRUB DISP (MISCELLANEOUS) ×4 IMPLANT
CANISTER SUCT 3000ML (MISCELLANEOUS) ×2 IMPLANT
CANISTER WOUND CARE 500ML ATS (WOUND CARE) IMPLANT
COVER MAYO STAND STRL (DRAPES) ×2 IMPLANT
COVER SURGICAL LIGHT HANDLE (MISCELLANEOUS) ×4 IMPLANT
CUFF TOURNIQUET SINGLE 24IN (TOURNIQUET CUFF) IMPLANT
CUFF TOURNIQUET SINGLE 34IN LL (TOURNIQUET CUFF) ×2 IMPLANT
DRAPE C-ARM 42X72 X-RAY (DRAPES) ×2 IMPLANT
DRAPE C-ARMOR (DRAPES) ×2 IMPLANT
DRAPE INCISE IOBAN 66X45 STRL (DRAPES) ×2 IMPLANT
DRAPE ORTHO SPLIT 77X108 STRL (DRAPES) ×2
DRAPE SURG ORHT 6 SPLT 77X108 (DRAPES) ×2 IMPLANT
DRAPE U-SHAPE 47X51 STRL (DRAPES) ×2 IMPLANT
DRILL BIT 2.5X100 214235007 (MISCELLANEOUS) ×2 IMPLANT
DRILL BIT 2.7X100 214235006 DU (MISCELLANEOUS) ×2 IMPLANT
DRSG ADAPTIC 3X8 NADH LF (GAUZE/BANDAGES/DRESSINGS) ×2 IMPLANT
DRSG PAD ABDOMINAL 8X10 ST (GAUZE/BANDAGES/DRESSINGS) ×2 IMPLANT
DRSG VAC ATS LRG SENSATRAC (GAUZE/BANDAGES/DRESSINGS) IMPLANT
DRSG VAC ATS MED SENSATRAC (GAUZE/BANDAGES/DRESSINGS) IMPLANT
DRSG VAC ATS SM SENSATRAC (GAUZE/BANDAGES/DRESSINGS) IMPLANT
ELECT REM PT RETURN 9FT ADLT (ELECTROSURGICAL) ×2
ELECTRODE REM PT RTRN 9FT ADLT (ELECTROSURGICAL) ×1 IMPLANT
EVACUATOR 1/8 PVC DRAIN (DRAIN) IMPLANT
EVACUATOR 3/16  PVC DRAIN (DRAIN)
EVACUATOR 3/16 PVC DRAIN (DRAIN) IMPLANT
GLOVE BIO SURGEON STRL SZ7.5 (GLOVE) ×2 IMPLANT
GLOVE BIO SURGEON STRL SZ8 (GLOVE) ×2 IMPLANT
GLOVE BIOGEL PI IND STRL 7.5 (GLOVE) ×1 IMPLANT
GLOVE BIOGEL PI IND STRL 8 (GLOVE) ×1 IMPLANT
GLOVE BIOGEL PI INDICATOR 7.5 (GLOVE) ×1
GLOVE BIOGEL PI INDICATOR 8 (GLOVE) ×1
GOWN STRL REUS W/ TWL LRG LVL3 (GOWN DISPOSABLE) ×2 IMPLANT
GOWN STRL REUS W/ TWL XL LVL3 (GOWN DISPOSABLE) ×1 IMPLANT
GOWN STRL REUS W/TWL LRG LVL3 (GOWN DISPOSABLE) ×2
GOWN STRL REUS W/TWL XL LVL3 (GOWN DISPOSABLE) ×1
IMMOBILIZER KNEE 22 UNIV (SOFTGOODS) ×2 IMPLANT
K-WIRE ACE 1.6X6 (WIRE) ×6
KIT BASIN OR (CUSTOM PROCEDURE TRAY) ×2 IMPLANT
KIT ROOM TURNOVER OR (KITS) ×2 IMPLANT
KWIRE ACE 1.6X6 (WIRE) ×3 IMPLANT
MANIFOLD NEPTUNE II (INSTRUMENTS) IMPLANT
NDL SUT 6 .5 CRC .975X.05 MAYO (NEEDLE) IMPLANT
NEEDLE 22X1 1/2 (OR ONLY) (NEEDLE) IMPLANT
NEEDLE MAYO TAPER (NEEDLE)
NS IRRIG 1000ML POUR BTL (IV SOLUTION) ×2 IMPLANT
PACK GENERAL/GYN (CUSTOM PROCEDURE TRAY) ×2 IMPLANT
PACK ORTHO EXTREMITY (CUSTOM PROCEDURE TRAY) ×2 IMPLANT
PAD ARMBOARD 7.5X6 YLW CONV (MISCELLANEOUS) ×4 IMPLANT
PAD CAST 4YDX4 CTTN HI CHSV (CAST SUPPLIES) ×1 IMPLANT
PADDING CAST COTTON 4X4 STRL (CAST SUPPLIES) ×1
PADDING CAST COTTON 6X4 STRL (CAST SUPPLIES) ×2 IMPLANT
PLATE LOCK 7H STD LT PROX TIB (Plate) ×2 IMPLANT
SCREW CORT 3.5X40 815037040 (Screw) ×2 IMPLANT
SCREW CORTICAL 3.5MM  34MM (Screw) ×1 IMPLANT
SCREW CORTICAL 3.5MM 34MM (Screw) ×1 IMPLANT
SCREW CORTICAL 3.5MM 36MM (Screw) ×2 IMPLANT
SCREW LOCK 3.5X65 DIST TIB (Screw) ×2 IMPLANT
SCREW LOCK 3.5X75 816135075 DU (Screw) ×2 IMPLANT
SCREW LOCK 3.5X75 DIST TIB (Screw) ×6 IMPLANT
SCREW LOCK 3.5X80 DIST TIB (Screw) ×2 IMPLANT
SCREW LOCK 3.5X85 DIST TIB (Screw) ×2 IMPLANT
SCREW LOCK CORT STAR 3.5X28 (Screw) ×2 IMPLANT
SCREW LOCK CORT STAR 3.5X30 (Screw) ×2 IMPLANT
SCREW LOCK CORT STAR 3.5X60 (Screw) ×2 IMPLANT
SCREW LOCK CORT STAR 3.5X80 (Screw) ×2 IMPLANT
SET MONITOR QUICK PRESSURE (MISCELLANEOUS) IMPLANT
SPONGE GAUZE 4X4 12PLY (GAUZE/BANDAGES/DRESSINGS) ×2 IMPLANT
SPONGE GAUZE 4X4 12PLY STER LF (GAUZE/BANDAGES/DRESSINGS) ×2 IMPLANT
SPONGE LAP 18X18 X RAY DECT (DISPOSABLE) ×2 IMPLANT
STAPLER VISISTAT 35W (STAPLE) ×2 IMPLANT
STOCKINETTE IMPERVIOUS 9X36 MD (GAUZE/BANDAGES/DRESSINGS) ×2 IMPLANT
STOCKINETTE IMPERVIOUS LG (DRAPES) ×2 IMPLANT
SUCTION FRAZIER TIP 10 FR DISP (SUCTIONS) ×2 IMPLANT
SUT ETHILON 2 0 FSLX (SUTURE) IMPLANT
SUT ETHILON 3 0 PS 1 (SUTURE) IMPLANT
SUT PROLENE 0 CT 2 (SUTURE) ×4 IMPLANT
SUT VIC AB 0 CT1 27 (SUTURE) ×1
SUT VIC AB 0 CT1 27XBRD ANBCTR (SUTURE) ×1 IMPLANT
SUT VIC AB 0 CTB1 27 (SUTURE) IMPLANT
SUT VIC AB 1 CT1 27 (SUTURE) ×1
SUT VIC AB 1 CT1 27XBRD ANBCTR (SUTURE) ×1 IMPLANT
SUT VIC AB 2-0 CT1 27 (SUTURE) ×2
SUT VIC AB 2-0 CT1 TAPERPNT 27 (SUTURE) ×2 IMPLANT
SUT VIC AB 2-0 CT3 27 (SUTURE) IMPLANT
SUT VIC AB 2-0 CTB1 (SUTURE) IMPLANT
SYR 20ML ECCENTRIC (SYRINGE) IMPLANT
SYR BULB 3OZ (MISCELLANEOUS) ×2 IMPLANT
TOWEL OR 17X24 6PK STRL BLUE (TOWEL DISPOSABLE) ×2 IMPLANT
TOWEL OR 17X26 10 PK STRL BLUE (TOWEL DISPOSABLE) ×4 IMPLANT
TRAY FOLEY CATH 16FRSI W/METER (SET/KITS/TRAYS/PACK) IMPLANT
TUBE CONNECTING 12X1/4 (SUCTIONS) ×2 IMPLANT
WATER STERILE IRR 1000ML POUR (IV SOLUTION) IMPLANT
YANKAUER SUCT BULB TIP NO VENT (SUCTIONS) ×4 IMPLANT

## 2013-10-20 NOTE — Anesthesia Preprocedure Evaluation (Addendum)
Anesthesia Evaluation  Patient identified by MRN, date of birth, ID band Patient awake    Reviewed: Allergy & Precautions, H&P , NPO status , Patient's Chart, lab work & pertinent test results, reviewed documented beta blocker date and time   Airway Mallampati: II TM Distance: >3 FB Neck ROM: Full    Dental  (+) Edentulous Upper, Edentulous Lower   Pulmonary Current Smoker,  breath sounds clear to auscultation        Cardiovascular Rhythm:Regular Rate:Normal     Neuro/Psych PSYCHIATRIC DISORDERS Anxiety Depression    GI/Hepatic GERD-  Medicated and Controlled,(+)     substance abuse  cocaine use,   Endo/Other  Hypothyroidism   Renal/GU negative Renal ROS     Musculoskeletal negative musculoskeletal ROS (+)   Abdominal   Peds  Hematology negative hematology ROS (+)   Anesthesia Other Findings   Reproductive/Obstetrics negative OB ROS                         Anesthesia Physical Anesthesia Plan  ASA: II  Anesthesia Plan: General   Post-op Pain Management:    Induction: Intravenous  Airway Management Planned: LMA  Additional Equipment:   Intra-op Plan:   Post-operative Plan:   Informed Consent: I have reviewed the patients History and Physical, chart, labs and discussed the procedure including the risks, benefits and alternatives for the proposed anesthesia with the patient or authorized representative who has indicated his/her understanding and acceptance.     Plan Discussed with: CRNA and Anesthesiologist  Anesthesia Plan Comments: (L. Tibial Plateau Fracture Smoker Chronic LBP on duagesic patch and S/P spinal cord stimulator insertion H/O substance abuse Bipolar disorder  Plan GA with oral ETT   Roberts Gaudy, MD )        Anesthesia Quick Evaluation

## 2013-10-20 NOTE — Transfer of Care (Signed)
Immediate Anesthesia Transfer of Care Note  Patient: MATTIS FEATHERLY  Procedure(s) Performed: Procedure(s): OPEN REDUCTION INTERNAL FIXATION (ORIF) LEFT TIBIAL PLATEAU (Left) LEFT ANTERIOR COMPARTMENT FACSCIOTOMY (Left)  Patient Location: PACU  Anesthesia Type:General  Level of Consciousness: awake, alert , oriented and patient cooperative  Airway & Oxygen Therapy: Patient Spontanous Breathing and Patient connected to nasal cannula oxygen  Post-op Assessment: Report given to PACU RN and Post -op Vital signs reviewed and stable  Post vital signs: Reviewed and stable  Complications: No apparent anesthesia complications

## 2013-10-20 NOTE — H&P (Signed)
I have seen and examined the patient. I agree with the findings above.  I discussed with the patient the risks and benefits of surgery for his left tibial plateau fracture, including the possibility of infection, nerve injury, vessel injury, wound breakdown, arthritis, symptomatic hardware, DVT/ PE, loss of motion, and need for further surgery among others.  He acknwledged these risks and wished to proceed.   Rozanna Box, MD 10/20/2013 1:55 PM
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
## 2013-10-20 NOTE — Progress Notes (Signed)
Orthopedic Tech Progress Note Patient Details:  [name] 02-08-1966 [id number]  Ortho Devices Ortho Device/Splint Location: put overhead frame on bed Ortho Device/Splint Interventions: Ordered;Application   Braulio Bosch 10/20/2013, 9:33 PM

## 2013-10-20 NOTE — Brief Op Note (Signed)
10/20/2013  4:14 PM  PATIENT:  [name]  48 y.o. male  PRE-OPERATIVE DIAGNOSIS:  LEFT BICONDYLAR TIBIAL PLATEAU FRACTURE  POST-OPERATIVE DIAGNOSIS:  LEFT BICONDYLAR TIBIAL PLATEAU FRACTURE  PROCEDURE:  Procedure(s): 1. OPEN REDUCTION INTERNAL FIXATION (ORIF) LEFT BICONDYLAR TIBIAL PLATEAU (Left) 2. LEFT ANTERIOR COMPARTMENT FACSCIOTOMY (Left)  SURGEON:  Surgeon(s) and Role:    * Rozanna Box, MD - Primary  PHYSICIAN ASSISTANT: Ainsley Spinner, PA-C  ANESTHESIA:   general  I/O:  Total I/O In: 1000 [I.V.:1000] Out: -   SPECIMEN:  No Specimen  TOURNIQUET:  None  DICTATION: [id number]

## 2013-10-21 ENCOUNTER — Encounter (HOSPITAL_COMMUNITY): Payer: Self-pay | Admitting: Orthopedic Surgery

## 2013-10-21 LAB — BASIC METABOLIC PANEL
BUN: 11 mg/dL (ref 6–23)
CALCIUM: 9 mg/dL (ref 8.4–10.5)
CO2: 25 mEq/L (ref 19–32)
Chloride: 101 mEq/L (ref 96–112)
Creatinine, Ser: 0.81 mg/dL (ref 0.50–1.35)
GFR calc Af Amer: >90 mL/min (ref >90)
GFR calc non Af Amer: >90 mL/min (ref >90)
GLUCOSE: 86 mg/dL (ref 70–99)
POTASSIUM: 4 meq/L (ref 3.7–5.3)
Sodium: 137 mEq/L (ref 137–147)

## 2013-10-21 LAB — OXYCODONE SCREEN, URINE: Oxycodone Screen, Ur: POSITIVE ng/mL — ABNORMAL HIGH

## 2013-10-21 MED ORDER — DSS 100 MG PO CAPS: 100.0000 mg | ORAL_CAPSULE | Freq: Two times a day (BID) | ORAL | Status: DC

## 2013-10-21 MED ORDER — LEVOTHYROXINE SODIUM 75 MCG PO TABS
75.0000 ug | ORAL_TABLET | Freq: Every day | ORAL | Status: DC
  Administered 2013-10-22 – 2013-10-23 (×2): 75 ug via ORAL
  Filled 2013-10-21 (×5): qty 1

## 2013-10-21 MED ORDER — ENOXAPARIN SODIUM 40 MG/0.4ML ~~LOC~~ SOLN: 40.0000 mg | SUBCUTANEOUS | Status: DC
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
## 2013-10-21 NOTE — Discharge Instructions (Signed)
Orthopaedic Trauma Service Discharge Instructions   General Discharge Instructions  WEIGHT BEARING STATUS: Nonweightbearing Left leg  RANGE OF MOTION/ACTIVITY:unrestricted range of motion Left knee. Ok to remove knee brace for hygiene   Wound Care: daily dressing changes starting when you return home. See instructions below   Diet: as you were eating previously.  Can use over the counter stool softeners and bowel preparations, such as Miralax, to help with bowel movements.  Narcotics can be constipating.  Be sure to drink plenty of fluids  STOP SMOKING OR USING NICOTINE PRODUCTS!!!!  As discussed nicotine severely impairs your body's ability to heal surgical and traumatic wounds but also impairs bone healing.  Wounds and bone heal by forming microscopic blood vessels (angiogenesis) and nicotine is a vasoconstrictor (essentially, shrinks blood vessels).  Therefore, if vasoconstriction occurs to these microscopic blood vessels they essentially disappear and are unable to deliver necessary nutrients to the healing tissue.  This is one modifiable factor that you can do to dramatically increase your chances of healing your injury.    (This means no smoking, no nicotine gum, patches, etc)  DO NOT USE NONSTEROIDAL ANTI-INFLAMMATORY DRUGS (NSAID'S)  Using products such as Advil (ibuprofen), Aleve (naproxen), Motrin (ibuprofen) for additional pain control during fracture healing can delay and/or prevent the healing response.  If you would like to take over the counter (OTC) medication, Tylenol (acetaminophen) is ok.  However, some narcotic medications that are given for pain control contain acetaminophen as well. Therefore, you should not exceed more than 4000 mg of tylenol in a day if you do not have liver disease.  Also note that there are may OTC medicines, such as cold medicines and allergy medicines that my contain tylenol as well.  If you have any questions about medications and/or interactions please  ask your doctor/PA or your pharmacist.   PAIN MEDICATION USE AND EXPECTATIONS  You have likely been given narcotic medications to help control your pain.  After a traumatic event that results in an fracture (broken bone) with or without surgery, it is ok to use narcotic pain medications to help control one's pain.  We understand that everyone responds to pain differently and each individual patient will be evaluated on a regular basis for the continued need for narcotic medications. Ideally, narcotic medication use should last no more than 6-8 weeks (coinciding with fracture healing).   As a patient it is your responsibility as well to monitor narcotic medication use and report the amount and frequency you use these medications when you come to your office visit.   We would also advise that if you are using narcotic medications, you should take a dose prior to therapy to maximize you participation.  IF YOU ARE ON NARCOTIC MEDICATIONS IT IS NOT PERMISSIBLE TO OPERATE A MOTOR VEHICLE (MOTORCYCLE/CAR/TRUCK/MOPED) OR HEAVY MACHINERY DO NOT MIX NARCOTICS WITH OTHER CNS (CENTRAL NERVOUS SYSTEM) DEPRESSANTS SUCH AS ALCOHOL       ICE AND ELEVATE INJURED/OPERATIVE EXTREMITY  Using ice and elevating the injured extremity above your heart can help with swelling and pain control.  Icing in a pulsatile fashion, such as 20 minutes on and 20 minutes off, can be followed.    Do not place ice directly on skin. Make sure there is a barrier between to skin and the ice pack.    Using frozen items such as frozen peas works well as the conform nicely to the are that needs to be iced.  USE AN ACE WRAP OR TED HOSE  FOR SWELLING CONTROL  In addition to icing and elevation, Ace wraps or TED hose are used to help limit and resolve swelling.  It is recommended to use Ace wraps or TED hose until you are informed to stop.    When using Ace Wraps start the wrapping distally (farthest away from the body) and wrap proximally  (closer to the body)   Example: If you had surgery on your leg or thing and you do not have a splint on, start the ace wrap at the toes and work your way up to the thigh        If you had surgery on your upper extremity and do not have a splint on, start the ace wrap at your fingers and work your way up to the upper arm  IF YOU ARE IN A SPLINT OR CAST DO NOT [place]   If your splint gets wet for any reason please contact the office immediately. You may shower in your splint or cast as long as you keep it dry.  This can be done by wrapping in a cast cover or garbage back (or similar)  Do Not stick any thing down your splint or cast such as pencils, money, or hangers to try and scratch yourself with.  If you feel itchy take benadryl as prescribed on the bottle for itching  IF YOU ARE IN A CAM BOOT (BLACK BOOT)  You may remove boot periodically. Perform daily dressing changes as noted below.  Wash the liner of the boot regularly and wear a sock when wearing the boot. It is recommended that you sleep in the boot until told otherwise  CALL THE OFFICE WITH ANY QUESTIONS OR CONCERTS: [phone]     Discharge Pin Site Instructions  Dress pins daily with Kerlix roll starting on POD 2. Wrap the Kerlix so that it tamps the skin down around the pin-skin interface to prevent/limit motion of the skin relative to the pin.  (Pin-skin motion is the primary cause of pain and infection related to external fixator pin sites).  Remove any crust or coagulum that may obstruct drainage with a saline moistened gauze or soap and water.  After POD 3, if there is no discernable drainage on the pin site dressing, the interval for change can by increased to every other day.  You may shower with the fixator, cleaning all pin sites gently with soap and water.  If you have a surgical wound this needs to be completely dry and without drainage before showering.  The extremity can be lifted by the fixator to  facilitate wound care and transfers.  Notify the office/Doctor if you experience increasing drainage, redness, or pain from a pin site, or if you notice purulent (thick, snot-like) drainage.  Discharge Wound Care Instructions  Do NOT apply any ointments, solutions or lotions to pin sites or surgical wounds.  These prevent needed drainage and even though solutions like hydrogen peroxide kill bacteria, they also damage cells lining the pin sites that help fight infection.  Applying lotions or ointments can keep the wounds moist and can cause them to breakdown and open up as well. This can increase the risk for infection. When in doubt call the office.  Surgical incisions should be dressed daily.  If any drainage is noted, use one layer of adaptic, then gauze, Kerlix, and an ace wrap.  Once the incision is completely dry and without drainage, it may be left open to air out.  Showering may begin 36-48 hours later.  Cleaning gently with soap and water.  Traumatic wounds should be dressed daily as well.    One layer of adaptic, gauze, Kerlix, then ace wrap.  The adaptic can be discontinued once the draining has ceased    If you have a wet to dry dressing: wet the gauze with saline the squeeze as much saline out so the gauze is moist (not soaking wet), place moistened gauze over wound, then place a dry gauze over the moist one, followed by Kerlix wrap, then ace wrap.

## 2013-10-21 NOTE — Evaluation (Signed)
Physical Therapy Evaluation Patient Details Name: [name] MRN: [id number] DOB: 02-22-1966 Today's Date: 10/21/2013   History of Present Illness  pt presents after [place] ORIF with fx occurring on 09/30/13.    Clinical Impression  Pt well known to this PT from previous admit and is mobilizing better during this admit.  Will continue to follow.      Follow Up Recommendations Home health PT;Supervision - Intermittent    Equipment Recommendations  None recommended by PT    Recommendations for Other Services       Precautions / Restrictions Precautions Precautions: Fall Required Braces or Orthoses: Knee Immobilizer - Left Knee Immobilizer - Left: On at all times Restrictions Weight Bearing Restrictions: Yes LLE Weight Bearing: Non weight bearing      Mobility  Bed Mobility Overal bed mobility: Modified Independent                Transfers Overall transfer level: Needs assistance Equipment used: Rolling walker (2 wheeled) Transfers: Sit to/from Stand Sit to Stand: Supervision         General transfer comment: pt demos good NWBing and no physical A needed.    Ambulation/Gait Ambulation/Gait assistance: Supervision Ambulation Distance (Feet): 60 Feet Assistive device: Rolling walker (2 wheeled) Gait Pattern/deviations: Step-to pattern     General Gait Details: pt doing well maintaining NWBing throughout gait today.  Demos good use of RW.    Stairs            Wheelchair Mobility    Modified Rankin (Stroke Patients Only)       Balance Overall balance assessment: Needs assistance         Standing balance support: Bilateral upper extremity supported;Single extremity supported Standing balance-Leahy Scale: Poor Standing balance comment: pt is able to maintain standing balance with only single UE suppport.                               Pertinent Vitals/Pain Premedicated.      Home Living Family/patient expects to be  discharged to:: Private residence Living Arrangements: Alone Available Help at Discharge: Family;Available PRN/intermittently Type of Home: Apartment Home Access: Level entry     Home Layout: One level Home Equipment: Walker - 2 wheels;Crutches Additional Comments: Pt lives alone and reports that ex-wife and daughter are available to help intermittent. However, feel that pt will need 24/7 supervision to prevent further falls.     Prior Function Level of Independence: Independent         Comments: Independent prior to inital fall on 5/1.       Hand Dominance   Dominant Hand: Right    Extremity/Trunk Assessment   Upper Extremity Assessment: Defer to OT evaluation           Lower Extremity Assessment: LLE deficits/detail   LLE Deficits / Details: L LE in KI.  Strength on LE liminted by pain.    Cervical / Trunk Assessment: Normal  Communication   Communication: No difficulties  Cognition Arousal/Alertness: Awake/alert Behavior During Therapy: WFL for tasks assessed/performed Overall Cognitive Status: Within Functional Limits for tasks assessed                      General Comments      Exercises        Assessment/Plan    PT Assessment Patient needs continued PT services  PT Diagnosis Difficulty walking   PT Problem  List Decreased strength;Decreased activity tolerance;Decreased balance;Decreased mobility;Decreased coordination;Decreased knowledge of use of DME  PT Treatment Interventions DME instruction;Gait training;Stair training;Functional mobility training;Therapeutic activities;Therapeutic exercise;Balance training;Patient/family education   PT Goals (Current goals can be found in the Care Plan section) Acute Rehab PT Goals Patient Stated Goal: To go home PT Goal Formulation: With patient Time For Goal Achievement: 10/28/13 Potential to Achieve Goals: Good    Frequency Min 5X/week   Barriers to discharge Decreased caregiver support       Co-evaluation               End of Session Equipment Utilized During Treatment: Gait belt;Left knee immobilizer Activity Tolerance: Patient tolerated treatment well Patient left: in chair;with call bell/phone within reach Nurse Communication: Mobility status         Time: 9983-3825 PT Time Calculation (min): 18 min   Charges:   PT Evaluation $Initial PT Evaluation Tier I: 1 Procedure PT Treatments $Gait Training: 8-22 mins   PT G CodesCatarina Hartshorn, [place] [phone] 10/21/2013, 12:19 PM

## 2013-10-21 NOTE — Care Management Note (Signed)
CARE MANAGEMENT NOTE 10/21/2013  Patient:  [name], [name]   Account Number:  [id number]  Date Initiated:  10/21/2013  Documentation initiated by:  Ricki Miller  Subjective/Objective Assessment:   48 yr old male s/p ORIF with faciotomy of left tib/fib fracture.     Action/Plan:   Case manager spoke with patient concerning home health and DME needs at discharge. Choice offered. Referral called to [facility], [facility] liason.   Anticipated DC Date:  10/22/2013   Anticipated DC Plan:  [facility]  CM consult      [place]   Choice offered to / List presented to:  C-1 Patient   DME arranged  3-N-1  [facility]      DME agency  [place] arranged  [place].   Status of service:  Completed, signed off Medicare Important Message given?   (If response is "NO", the following Medicare IM given date fields will be blank) Date Medicare IM given:   Date Additional Medicare IM given:    Discharge Disposition:  [place]

## 2013-10-21 NOTE — Progress Notes (Signed)
[facility] Progress Note  Subjective  Complains of severe pain but appears comfortable and is eating breakfast   No other complaints   Chronic Pain MD: Marciano Sequin, MD  [facility] in [place].  Pt has just established a relationship with him. Spoke with Dr. Mee Hives who indicated that pt has already signed pain contract and the he would like to manage pts pain.  We will not provide Rxs for pt at dc other than dvt prophylaxis    Objective   BP 124/83  Pulse 62  Temp(Src) 98.2 F (36.8 C) (Oral)  Resp 18  Ht 6' (1.829 m)  Wt 96.616 kg (213 lb)  BMI 28.88 kg/m2  SpO2 97%  Intake/Output     05/21 0701 - 05/22 0700 05/22 0701 - 05/23 0700    P.O. 840 120    I.V. (mL/kg) 2000 (20.7)     Total Intake(mL/kg) 2840 (29.4) 120 (1.2)    Urine (mL/kg/hr) 800 450 (0.9)    Blood 75     Total Output 875 450    Net +1965 -330          Urine Occurrence  1 x      Labs Results for IRENE, MITCHAM (MRN [id number]) as of 10/21/2013 12:27   Ref. Range  10/21/2013 06:30   Sodium  Latest Range: 137-147 mEq/L  137   Potassium  Latest Range: 3.7-5.3 mEq/L  4.0   Chloride  Latest Range: 96-112 mEq/L  101   CO2  Latest Range: 19-32 mEq/L  25   BUN  Latest Range: 6-23 mg/dL  11   Creatinine  Latest Range: 0.50-1.35 mg/dL  0.81   Calcium  Latest Range: 8.4-10.5 mg/dL  9.0   GFR calc non Af Amer  Latest Range: >90 mL/min  >90   GFR calc Af Amer  Latest Range: >90 mL/min  >90   Glucose  Latest Range: 70-99 mg/dL  86    Results for CAROLE, DEERE (MRN [id number]) as of 10/21/2013 12:27   Ref. Range  10/20/2013 12:48   Oxycodone Screen, Ur  Latest Range: Cutoff:100 ng/mL  POSITIVE (H)    Exam  Gen: awake and alert, sitting in bed, eating breakfast, appears comfortable   Lungs: clear anterior fields   Cardiac: RRR, s1 and s2 Abd: soft, NTND, +BS Ext:        Left Lower Extremity               Dressing c/d/i             Immobilizer fitting well  Distal motor and sensory functions intact             Ext warm             + DP pulse               No dct             Compartments soft and NT             No pain with passive stretch     Assessment and Plan   POD/HD#: 1   48 y/o male with chronic pain issues s/p ORIF L bicondylar tibial plateau fracture   1. L bicondylar tibial plateau fracture s/p ORIF             NWB x 8 weeks             Unrestricted ROM  Will order hinged knee brace             Total knee precautions- no pillows under knee at rest, place under ankle                 Ice and elevate             Dressing change tomorrow or Sunday   2. Pain management:             Continue current regimen             Discussed with Dr. Mee Hives, he will continue pain management at dc   3. Hemodynamics             Check CBC in am   4. Medical issues               Home meds  5. DVT/PE prophylaxis:             Lovenox x 14 days  6. ID:               completed peri-op course   7. Activity:             OOB as tolerated  NWB L leg  Unrestricted ROM L leg   8. FEN/Foley/Lines:             advance diet as tolerated  NSL IVF   9. Impediments to fracture healing:             nicotine dependence  Chronic narcotic use   Thyroid disease    10. Dispo:             PT/OT evals  Likely ready for dc Sunday    Jari Pigg, PA-C [facility] [phone] (P) 10/21/2013 12:21 PM  **Disclaimer: This note may have been dictated with voice recognition software. Similar sounding words can inadvertently be transcribed and this note may contain transcription errors which may not have been corrected upon publication of note.**

## 2013-10-21 NOTE — Progress Notes (Signed)
Called [facility] in ortho to order hinge brace

## 2013-10-21 NOTE — Progress Notes (Signed)
Utilization review completed.  

## 2013-10-21 NOTE — Evaluation (Signed)
Occupational Therapy Evaluation Patient Details Name: [name] MRN: [id number] DOB: Nov 08, 1965 Today's Date: 10/21/2013    History of Present Illness pt presents after R TIb Plateau ORIF and left anterior compartment fasciotomy with fx occurring on 09/30/13.     Clinical Impression   This 48 yo male admitted and underwent above was at an intermittent S level PTA from a RW level and NWB'ing LLE and now with decreased AROM LLE, increased pain LLE, and decreased mobility all affecting pt's ability to care for himself at home. He will benefit from continued acute OT with follow up [place] and continued aide 3 days per week.     Follow Up Recommendations  Home health OT;Supervision - Intermittent    Equipment Recommendations  Tub/shower bench;3 in 1 bedside comode       Precautions / Restrictions Precautions Precautions: Fall Required Braces or Orthoses: Knee Immobilizer - Left Knee Immobilizer - Left: On at all times (until bledsoe brace arrives then can begin ROM of left knee) Restrictions Weight Bearing Restrictions: Yes LLE Weight Bearing: Non weight bearing      Mobility Bed Mobility Overal bed mobility: Modified Independent (HOB up)                Transfers Overall transfer level: Needs assistance Equipment used: Rolling walker (2 wheeled) Transfers: Sit to/from Stand Sit to Stand: Min guard         General transfer comment: pt demos good NWBing and no physical A needed.      Balance Overall balance assessment: Needs assistance Sitting-balance support: Feet supported;No upper extremity supported Sitting balance-Leahy Scale: Good     Standing balance support: During functional activity;Single extremity supported Standing balance-Leahy Scale: Fair Standing balance comment: Had pt stand and take one hand off of RW at a time as if to simulate pulling up pants, also had him try with both hands off of the RW (more tentative with this)                             ADL Overall ADL's : Needs assistance/impaired Eating/Feeding: Independent;Sitting   Grooming: Set up;Sitting   Upper Body Bathing: Set up;Sitting   Lower Body Bathing: Min guard;Sit to/from stand   Upper Body Dressing : Set up;Sitting   Lower Body Dressing: Moderate assistance (with Min guard A sit<>stand)   Toilet Transfer: Min guard (Bed>door and back>to recliner)   Toileting- Water quality scientist and Hygiene: Min guard;Sit to/from stand       Functional mobility during ADLs: Min guard;Rolling walker General ADL Comments: Pt states that he has AE at home and has been using it--will need to make sure next session that he still able to use it with new Bledsoe brace on. Pt reports that he was suppose to get a 3n1 when he left last time, but did not get one. Pt reports that at home he moves items around in the kitchen by moving them along countertops/stove top to get them where they need to be/where he wants them.               Pertinent Vitals/Pain Pain: "It's hurting, the nurse is on her way in to give me medication right now"; nurse gave him pain meds while we were in the room     Hand Dominance Right   Extremity/Trunk Assessment Upper Extremity Assessment Upper Extremity Assessment: Overall WFL for tasks assessed        Communication Communication  Communication: No difficulties   Cognition Arousal/Alertness: Awake/alert Behavior During Therapy: WFL for tasks assessed/performed Overall Cognitive Status: Within Functional Limits for tasks assessed                                Home Living Family/patient expects to be discharged to:: Private residence Living Arrangements: Alone Available Help at Discharge: Family;Available PRN/intermittently;Personal care attendant (PCA is there 3 days a week about 1 1/2 hours a day (helps with cooking/cleaning/ADLS prn)) Type of Home: Apartment Home Access: Level entry     Home Layout: One level      Bathroom Shower/Tub: Tub/shower unit Shower/tub characteristics: Curtain Biochemist, clinical: Handicapped height Bathroom Accessibility: Yes How Accessible: Accessible via walker Home Equipment: Walker - 2 wheels;Crutches   Additional Comments: Pt lives alone and reports that wife (currently separated) and daughter are available to help intermittently.       Prior Functioning/Environment Level of Independence: Independent        Comments: Independent prior to inital fall on 5/1.      OT Diagnosis: Generalized weakness;Acute pain   OT Problem List: Impaired balance (sitting and/or standing);Decreased knowledge of use of DME or AE;Pain   OT Treatment/Interventions: Self-care/ADL training;DME and/or AE instruction;Balance training;Patient/family education;Therapeutic activities    OT Goals(Current goals can be found in the care plan section) Acute Rehab OT Goals Patient Stated Goal: To go home OT Goal Formulation: With patient Time For Goal Achievement: 10/28/13 Potential to Achieve Goals: Good  OT Frequency: Min 2X/week              End of Session Equipment Utilized During Treatment: Gait belt;Rolling walker;Left knee immobilizer Nurse Communication: Mobility status (NT)  Activity Tolerance: Patient tolerated treatment well Patient left: in chair;with call bell/phone within reach   Time: 6333-5456 OT Time Calculation (min): 19 min Charges:  OT General Charges $OT Visit: 1 Procedure OT Evaluation $Initial OT Evaluation Tier I: 1 Procedure OT Treatments $Self Care/Home Management : 8-22 mins  Almon Register [phone] 10/21/2013, 2:45 PM

## 2013-10-22 ENCOUNTER — Inpatient Hospital Stay (HOSPITAL_COMMUNITY): Payer: 59

## 2013-10-22 LAB — BASIC METABOLIC PANEL
BUN: 11 mg/dL (ref 6–23)
CALCIUM: 9.2 mg/dL (ref 8.4–10.5)
CO2: 24 mEq/L (ref 19–32)
Chloride: 102 mEq/L (ref 96–112)
Creatinine, Ser: 0.78 mg/dL (ref 0.50–1.35)
GFR calc Af Amer: >90 mL/min (ref >90)
GLUCOSE: 98 mg/dL (ref 70–99)
Potassium: 3.6 mEq/L — ABNORMAL LOW (ref 3.7–5.3)
Sodium: 139 mEq/L (ref 137–147)

## 2013-10-22 NOTE — Progress Notes (Signed)
Occupational therapist called RN to room around 12:48pm and informed patient was assisted to the ground after tipping over on shower chair.  Patient noted to be sitting on floor and complained of left knee pain where recent surgery was performed. Denies any other areas of pain and appeared to be stable.  Vital signs obtained. BP slightly elevated and HR tachy in the 120's.  Assisted pt to bed by 2 RNs and OT using gait belt.  Once patient placed into bed, vital signs rechecked, BP remained slightly elevated and HR down to 94 bpm.  Informed Vonda Antigua, PA regarding assisted fall. Erasmo Downer ordered two view portable X-ray to be performed of left knee. Orders received and carried out.

## 2013-10-22 NOTE — Progress Notes (Signed)
Physical Therapy Treatment Patient Details Name: [name] MRN: [id number] DOB: Apr 30, 1966 Today's Date: 11-17-13    History of Present Illness      PT Comments    Increased gait distance noted today.  Pt progressing well.  Pt declining sitting in recliner, stating it is too uncomfortable.  Follow Up Recommendations  Home health PT;Supervision - Intermittent     Equipment Recommendations  None recommended by PT    Recommendations for Other Services       Precautions / Restrictions Precautions Precautions: Fall Required Braces or Orthoses: Other Brace/Splint (hinged knee brace, left) Knee Immobilizer - Left: On at all times Restrictions LLE Weight Bearing: Non weight bearing    Mobility  Bed Mobility Overal bed mobility: Modified Independent                Transfers   Equipment used: Rolling walker (2 wheeled)   Sit to Stand: Min guard Stand pivot transfers: Min guard          Ambulation/Gait Ambulation/Gait assistance: Min guard Ambulation Distance (Feet): 100 Feet Assistive device: Rolling walker (2 wheeled) Gait Pattern/deviations: Step-to pattern Gait velocity: decreased       Stairs            Wheelchair Mobility    Modified Rankin (Stroke Patients Only)       Balance                                    Cognition Arousal/Alertness: Awake/alert Behavior During Therapy: WFL for tasks assessed/performed Overall Cognitive Status: Within Functional Limits for tasks assessed                      Exercises      General Comments        Pertinent Vitals/Pain 6/10 (with ambulation)    Home Living                      Prior Function            PT Goals (current goals can now be found in the care plan section) Progress towards PT goals: Progressing toward goals    Frequency  Min 5X/week    PT Plan Current plan remains appropriate    Co-evaluation             End of  Session Equipment Utilized During Treatment: Gait belt;Other (comment) (left hinged knee brace) Activity Tolerance: Patient tolerated treatment well Patient left: in bed;with call bell/phone within reach     Time: 1006-1030 PT Time Calculation (min): 24 min  Charges:  $Gait Training: 23-37 mins                    G Codes:      Philippa Sicks November 17, 2013, 10:34 AM  Lorrin Goodell, PT  Office # [phone] Pager [phone]

## 2013-10-22 NOTE — Progress Notes (Signed)
Subjective: 2 Days Post-Op Procedure(s) (LRB): OPEN REDUCTION INTERNAL FIXATION (ORIF) LEFT TIBIAL PLATEAU (Left) LEFT ANTERIOR COMPARTMENT FACSCIOTOMY (Left) Patient reports pain as 6 on 0-10 scale.    Objective: Vital signs in last 24 hours: Temp:  [97.7 F (36.5 C)-98.7 F (37.1 C)] 97.7 F (36.5 C) (05/23 1253) Pulse Rate:  [74-123] 94 (05/23 1253) Resp:  [16-18] 16 (05/23 1250) BP: (102-144)/(71-81) 144/81 mmHg (05/23 1253) SpO2:  [98 %-99 %] 98 % (05/23 1250)  Intake/Output from previous day: 05/22 0701 - 05/23 0700 In: 852.5 [P.O.:360; I.V.:342.5; IV Piggyback:150] Out: 2150 [Urine:2150] Intake/Output this shift: Total I/O In: -  Out: 1640 [Urine:1640]   Recent Labs  10/20/13 1223  HGB 15.0    Recent Labs  10/20/13 1223  WBC 8.1  RBC 4.71  HCT 43.0  PLT 320    Recent Labs  10/21/13 0630 10/22/13 0330  NA 137 139  K 4.0 3.6*  CL 101 102  CO2 25 24  BUN 11 11  CREATININE 0.81 0.78  GLUCOSE 86 98  CALCIUM 9.0 9.2    Recent Labs  10/20/13 1223  INR 0.97    ABD soft Neurovascular intact Sensation intact distally Intact pulses distally Dorsiflexion/Plantar flexion intact Incision: dressing C/D/I  Assessment/Plan: 2 Days Post-Op Procedure(s) (LRB): OPEN REDUCTION INTERNAL FIXATION (ORIF) LEFT TIBIAL PLATEAU (Left) LEFT ANTERIOR COMPARTMENT FACSCIOTOMY (Left) Advance diet Up with therapy Plan for discharge tomorrow No narcotics are to be given to this patient at discharge as he has been using multiple physicians, including his PCP, Dr Marcelino Scot, and his pain management physician.  I have spoken to Ainsley Spinner, PA-C who has spoken with this pain management physician.  His pain management physician has requested that we give him NO narcotics at discharge and he will handle the situation  [place] 10/22/2013, 3:12 PM

## 2013-10-22 NOTE — Progress Notes (Signed)
Occupational Therapy Treatment Patient Details Name: [name] MRN: [id number] DOB: 06-12-1965 Today's Date: 10/22/2013    History of present illness pt presents after L TIb Plateau ORIF and left anterior compartment fasciotomy with fx occurring on 09/30/13.     OT comments  Pt. Agreeable to OOB for skilled OT session.  Able to complete tub transfer with use of tub bench with s/min guard a.  Amb. Well with RW during functional tasks and is able to maintain NWB with out cues.    Follow Up Recommendations  Home health OT;Supervision - Intermittent    Equipment Recommendations  Tub/shower bench;3 in 1 bedside comode          Precautions / Restrictions Precautions Precautions: Fall Required Braces or Orthoses: Other Brace/Splint Knee Immobilizer - Left: On at all times Restrictions LLE Weight Bearing: Non weight bearing       Mobility Bed Mobility Overal bed mobility: Modified Independent Bed Mobility: Supine to Sit;Sit to Supine     Supine to sit: Supervision Sit to supine: Supervision   General bed mobility comments: lowered bed rail for in/out of bed to simulate home environment, pt. able to complete with no noted difficulty, some cues required for sequencing  Transfers Overall transfer level: Needs assistance Equipment used: Rolling walker (2 wheeled) Transfers: Sit to/from [employer] Sit to Stand: Min guard Stand pivot transfers: Min guard       General transfer comment: pt demos good NWBing and no physical A needed.                                         ADL                                   Tub/ Shower Transfer: Tub transfer;Min guard;Tub bench;Ambulation   Functional mobility during ADLs: Min guard;Rolling walker General ADL Comments: completed simulated tub transfer using bench in room.  during "out of tub" tub bench began to tip over.  assited pt. to floor and then with rn assistance assisted back to  bed.  prior to incident pt. was able to complete transfer with s/min guard a.                                                                                                General Comments  "assisted fall" occurred during a portion of the tub transfer.  RN immediately notified.  All necessary documentation completed.  Refer to safety portal for further details as needed.    Pertinent Vitals/ Pain      8/10 in L knee at end of session, meds given at beginning of session.  RN present and aware  Frequency Min 2X/week     Progress Toward Goals  OT Goals(current goals can now be found in the care plan section)  Progress towards OT goals: Progressing toward goals     Plan Discharge plan remains appropriate                    End of Session Equipment Utilized During Treatment: Rolling walker;Left knee immobilizer   Activity Tolerance Patient tolerated treatment well   Patient Left in bed;with call bell/phone within reach;with nursing/sitter in room   Nurse Communication Other (comment) (alerted RN of assited fall that had occured)        Time: 1230-1300 OT Time Calculation (min): 30 min  Charges: OT General Charges $OT Visit: 1 Procedure OT Treatments $Self Care/Home Management : 23-37 mins  [name], COTA/L 10/22/2013, 2:54 PM

## 2013-10-23 LAB — BASIC METABOLIC PANEL
BUN: 10 mg/dL (ref 6–23)
CHLORIDE: 104 meq/L (ref 96–112)
CO2: 26 mEq/L (ref 19–32)
CREATININE: 0.72 mg/dL (ref 0.50–1.35)
Calcium: 9.1 mg/dL (ref 8.4–10.5)
GFR calc Af Amer: >90 mL/min (ref >90)
Glucose, Bld: 87 mg/dL (ref 70–99)
Potassium: 4 mEq/L (ref 3.7–5.3)
Sodium: 141 mEq/L (ref 137–147)

## 2013-10-23 NOTE — Progress Notes (Signed)
Pt continues to ask for all his pain medications. He knows when  the medications are due and will call for his meds at the exact time they are due. He knows his dilaudid is ordered q 2 hours and demands the medication. He insists his pain is a nine or ten although he does sleep after receiving the dilaudid.

## 2013-10-23 NOTE — Progress Notes (Signed)
Physical Therapy Treatment Patient Details Name: [name] MRN: [id number] DOB: 29-Oct-1965 Today's Date: 11/03/13    History of Present Illness      PT Comments    Plan is for d/c home today with HHPT.  Follow Up Recommendations  Home health PT;Supervision - Intermittent     Equipment Recommendations  None recommended by PT    Recommendations for Other Services       Precautions / Restrictions Precautions Precautions: Fall Required Braces or Orthoses: Other Brace/Splint (hinged knee brace) Other Brace/Splint: on at all times Restrictions LLE Weight Bearing: Non weight bearing    Mobility  Bed Mobility         Supine to sit: Supervision Sit to supine: Supervision      Transfers   Equipment used: Rolling walker (2 wheeled)   Sit to Stand: Supervision Stand pivot transfers: Supervision          Ambulation/Gait Ambulation/Gait assistance: Min guard Ambulation Distance (Feet): 150 Feet Assistive device: Rolling walker (2 wheeled) Gait Pattern/deviations: Step-to pattern Gait velocity: decreased       Stairs            Wheelchair Mobility    Modified Rankin (Stroke Patients Only)       Balance                                    Cognition Arousal/Alertness: Awake/alert Behavior During Therapy: WFL for tasks assessed/performed Overall Cognitive Status: Within Functional Limits for tasks assessed                      Exercises      General Comments        Pertinent Vitals/Pain     Home Living                      Prior Function            PT Goals (current goals can now be found in the care plan section) Progress towards PT goals: Progressing toward goals    Frequency  Min 5X/week    PT Plan Current plan remains appropriate    Co-evaluation             End of Session Equipment Utilized During Treatment: Gait belt;Other (comment) (hinged knee brace) Activity Tolerance:  Patient tolerated treatment well Patient left: in bed;with call bell/phone within reach     Time: 1046-1056 PT Time Calculation (min): 10 min  Charges:  $Gait Training: 8-22 mins                    G Codes:      Philippa Sicks 11/03/13, 11:13 AM  Lorrin Goodell, PT  Office # [phone] Pager [phone]

## 2013-10-25 NOTE — Anesthesia Postprocedure Evaluation (Signed)
  Anesthesia Post-op Note  Patient: [name]  Procedure(s) Performed: Procedure(s): OPEN REDUCTION INTERNAL FIXATION (ORIF) LEFT TIBIAL PLATEAU (Left) LEFT ANTERIOR COMPARTMENT FACSCIOTOMY (Left)  Patient discharged with no apparent anesthetic complications

## 2013-10-28 NOTE — Discharge Summary (Signed)
[facility]thopaedic [facility]uma [facility] (OTS)  Patient ID: [name] MRN: [id number] DOB/AGE: Dec 22, 1965 48 y.o.  Admit date: 10/20/2013 Discharge date: 10/23/2013  Admission Diagnoses: Closed left tibial plateau fracture Bipolar disorder Anxiety Chronic pain History of polysubstance abuse including cocaine Status post spinal cord stimulator GERD Hyperlipidemia Hypothyroidism  Discharge Diagnoses:  Principal Problem:   Tibial plateau fracture Active Problems:   HYPERLIPIDEMIA   DISORDER, BIPOLAR NOS   ANXIETY   PAIN, CHRONIC NEC   GERD   Hypothyroidism, lithium induced   Cocaine substance abuse   Chronic back pain   Spinal cord stimulator, status-post   Procedures Performed:  10/20/2013- Dr. Marcelino Scot 1. Open reduction and internal fixation of the left bicondylar tibial     plateau. 2. Left anterior compartment fasciotomy    Discharged Condition: good  Hospital Course: Patient is a 48 year old white male with a history notable for chronic pain as well as polysubstance abuse and bipolar disorder who sustained a fall back on 10/02/2013 with resultant left tibial plateau fracture. Patient was initially seen in the emergency department on 10/02/2013 is secondary to alterations in his self care ability was admitted to the medicine service for pain control and therapies. He was subsequently discharged on 10/05/2013 with instructions to followup with orthopedics. Given the complexity of his injury the orthopedic trauma service was consult and for definitive management. Patient was seen in our office as an outpatient for initial consult. After a review of his injury and physical examination it was felt that surgical intervention was necessary to restore alignment, stability, joint surface congruity to achieve best outcome for the patient. Please see admission H&P as well.  Patient was taken to the operating room on 10/20/2013 for the procedure described above. Patient tolerated the  procedure well and after surgery was taken to the PACU for recovery from anesthesia and was transferred to the orthopedic floor for continued observation, pain control and to begin therapies.  On postoperative day #1 patient was doing okay. He is complaining of severe pain but appeared to be very comfortable and was eating breakfast and did not appear to be in any acute distress. He had an extensive conversation with the patient is a pain management. He has a pain management specialist Dr. Marciano Sequin at [facility]. He is only had one appointment with Dr. Mee Hives as he was previously a patient in [place] but this is practice has since closed and he transferred his care to Dr. Mee Hives. I called and spoke to Dr. Mee Hives myself who indicates that the patient has already signed a pain contract and that he would like to continue with his pain management at discharge from hospital. This was clearly stated to the patient and his family member. Patient was also started on Lovenox for DVT and PE prophylaxis. Patient's for a hinged knee brace on postoperative day #1 to begin range of motion of his knee as well. She started with physical therapy occupational therapy on postoperative day #1 as well and in fact declined physical therapy on postoperative day #1. Postoperative day #2 patient was doing much better however he continued to ask for pain medication. He was also requesting his IV medication every 2 hours as it was prescribed despite several times not needed. Per nursing notes "Pt continues to ask for all his pain medications. He knows when the medications are due and will call for his meds at the exact time they are due. He knows his dilaudid is ordered q 2 hours and demands  the medication. He insists his pain is a nine or ten although he does sleep after receiving the dilaudid."  On postoperative day #3 patient was stable for discharge and was discharged to home     Consults: None  Significant  Diagnostic Studies: labs:  Results for [name], [name] (MRN [id number]) as of 10/28/2013 13:55  Ref. Range 10/23/2013 05:30  Sodium Latest Range: 137-147 mEq/L 141  Potassium Latest Range: 3.7-5.3 mEq/L 4.0  Chloride Latest Range: 96-112 mEq/L 104  CO2 Latest Range: 19-32 mEq/L 26  BUN Latest Range: 6-23 mg/dL 10  Creatinine Latest Range: 0.50-1.35 mg/dL 0.72  Calcium Latest Range: 8.4-10.5 mg/dL 9.1  GFR calc non Af Amer Latest Range: >90 mL/min >90  GFR calc Af Amer Latest Range: >90 mL/min >90  Glucose Latest Range: 70-99 mg/dL 87    Treatments: IV hydration, antibiotics: Ancef, analgesia: Dilaudid and fentanyl patch and oxycodone, anticoagulation: LMW heparin, therapies: PT, OT and RN and surgery: As above   Discharge Exam:  Subjective: 2 Days Post-Op Procedure(s) (LRB): OPEN REDUCTION INTERNAL FIXATION (ORIF) LEFT TIBIAL PLATEAU (Left) LEFT ANTERIOR COMPARTMENT FACSCIOTOMY (Left) Patient reports pain as 6 on 0-10 scale.    Objective: Vital signs in last 24 hours: Temp:  [97.7 F (36.5 C)-98.7 F (37.1 C)] 97.7 F (36.5 C) (05/23 1253) Pulse Rate:  [74-123] 94 (05/23 1253) Resp:  [16-18] 16 (05/23 1250) BP: (102-144)/(71-81) 144/81 mmHg (05/23 1253) SpO2:  [98 %-99 %] 98 % (05/23 1250)  Intake/Output from previous day: 05/22 0701 - 05/23 0700 In: 852.5 [P.O.:360; I.V.:342.5; IV Piggyback:150] Out: 2150 [Urine:2150] Intake/Output this shift: Total I/O In: -   Out: 1640 [Urine:1640]   Recent Labs   10/20/13 1223   HGB  15.0     Recent Labs   10/20/13 1223   WBC  8.1   RBC  4.71   HCT  43.0   PLT  320     Recent Labs   10/21/13 0630  10/22/13 0330   NA  137  139   K  4.0  3.6*   CL  101  102   CO2  25  24   BUN  11  11   CREATININE  0.81  0.78   GLUCOSE  86  98   CALCIUM  9.0  9.2     Recent Labs   10/20/13 1223   INR  0.97     ABD soft Neurovascular intact Sensation intact distally Intact pulses distally Dorsiflexion/Plantar flexion  intact Incision: dressing C/D/I  Assessment/Plan: 2 Days Post-Op Procedure(s) (LRB): OPEN REDUCTION INTERNAL FIXATION (ORIF) LEFT TIBIAL PLATEAU (Left) LEFT ANTERIOR COMPARTMENT FACSCIOTOMY (Left) Advance diet Up with therapy Plan for discharge tomorrow No narcotics are to be given to this patient at discharge as he has been using multiple physicians, including his PCP, Dr Marcelino Scot, and his pain management physician.  I have spoken to Ainsley Spinner, PA-C who has spoken with this pain management physician.  His pain management physician has requested that we give him NO narcotics at discharge and he will handle the situation  Linda Hedges 10/22/2013, 3:12 PM     Disposition: 06-Home-Health Care Svc      Discharge Instructions   Call MD / Call 911    Complete by:  As directed   If you experience chest pain or shortness of breath, CALL 911 and be transported to the hospital emergency room.  If you develope a fever above 101 F, pus (white drainage) or increased drainage  or redness at the wound, or calf pain, call your surgeon's office.     Constipation Prevention    Complete by:  As directed   Drink plenty of fluids.  Prune juice may be helpful.  You may use a stool softener, such as Colace (over the counter) 100 mg twice a day.  Use MiraLax (over the counter) for constipation as needed.     Diet general    Complete by:  As directed      Discharge instructions    Complete by:  As directed   Orthopaedic Trauma Service Discharge Instructions   General Discharge Instructions  WEIGHT BEARING STATUS: Nonweightbearing Left leg  RANGE OF MOTION/ACTIVITY:unrestricted range of motion Left knee. Ok to remove knee brace for hygiene   Wound Care: daily dressing changes starting when you return home. See instructions below   Diet: as you were eating previously.  Can use over the counter stool softeners and bowel preparations, such as Miralax, to help with bowel movements.  Narcotics can be  constipating.  Be sure to drink plenty of fluids  STOP SMOKING OR USING NICOTINE PRODUCTS!!!!  As discussed nicotine severely impairs your body's ability to heal surgical and traumatic wounds but also impairs bone healing.  Wounds and bone heal by forming microscopic blood vessels (angiogenesis) and nicotine is a vasoconstrictor (essentially, shrinks blood vessels).  Therefore, if vasoconstriction occurs to these microscopic blood vessels they essentially disappear and are unable to deliver necessary nutrients to the healing tissue.  This is one modifiable factor that you can do to dramatically increase your chances of healing your injury.    (This means no smoking, no nicotine gum, patches, etc)  DO NOT USE NONSTEROIDAL ANTI-INFLAMMATORY DRUGS (NSAID'S)  Using products such as Advil (ibuprofen), Aleve (naproxen), Motrin (ibuprofen) for additional pain control during fracture healing can delay and/or prevent the healing response.  If you would like to take over the counter (OTC) medication, Tylenol (acetaminophen) is ok.  However, some narcotic medications that are given for pain control contain acetaminophen as well. Therefore, you should not exceed more than 4000 mg of tylenol in a day if you do not have liver disease.  Also note that there are may OTC medicines, such as cold medicines and allergy medicines that my contain tylenol as well.  If you have any questions about medications and/or interactions please ask your doctor/PA or your pharmacist.   PAIN MEDICATION USE AND EXPECTATIONS  You have likely been given narcotic medications to help control your pain.  After a traumatic event that results in an fracture (broken bone) with or without surgery, it is ok to use narcotic pain medications to help control one's pain.  We understand that everyone responds to pain differently and each individual patient will be evaluated on a regular basis for the continued need for narcotic medications. Ideally,  narcotic medication use should last no more than 6-8 weeks (coinciding with fracture healing).   As a patient it is your responsibility as well to monitor narcotic medication use and report the amount and frequency you use these medications when you come to your office visit.   We would also advise that if you are using narcotic medications, you should take a dose prior to therapy to maximize you participation.  IF YOU ARE ON NARCOTIC MEDICATIONS IT IS NOT PERMISSIBLE TO OPERATE A MOTOR VEHICLE (MOTORCYCLE/CAR/TRUCK/MOPED) OR HEAVY MACHINERY DO NOT MIX NARCOTICS WITH OTHER CNS (CENTRAL NERVOUS SYSTEM) DEPRESSANTS SUCH AS ALCOHOL  ICE AND ELEVATE INJURED/OPERATIVE EXTREMITY  Using ice and elevating the injured extremity above your heart can help with swelling and pain control.  Icing in a pulsatile fashion, such as 20 minutes on and 20 minutes off, can be followed.    Do not place ice directly on skin. Make sure there is a barrier between to skin and the ice pack.    Using frozen items such as frozen peas works well as the conform nicely to the are that needs to be iced.  USE AN ACE WRAP OR TED HOSE FOR SWELLING CONTROL  In addition to icing and elevation, Ace wraps or TED hose are used to help limit and resolve swelling.  It is recommended to use Ace wraps or TED hose until you are informed to stop.    When using Ace Wraps start the wrapping distally (farthest away from the body) and wrap proximally (closer to the body)   Example: If you had surgery on your leg or thing and you do not have a splint on, start the ace wrap at the toes and work your way up to the thigh        If you had surgery on your upper extremity and do not have a splint on, start the ace wrap at your fingers and work your way up to the upper arm  IF YOU ARE IN A SPLINT OR CAST DO NOT [place]   If your splint gets wet for any reason please contact the office immediately. You may shower in your splint or  cast as long as you keep it dry.  This can be done by wrapping in a cast cover or garbage back (or similar)  Do Not stick any thing down your splint or cast such as pencils, money, or hangers to try and scratch yourself with.  If you feel itchy take benadryl as prescribed on the bottle for itching  IF YOU ARE IN A CAM BOOT (BLACK BOOT)  You may remove boot periodically. Perform daily dressing changes as noted below.  Wash the liner of the boot regularly and wear a sock when wearing the boot. It is recommended that you sleep in the boot until told otherwise  CALL THE OFFICE WITH ANY QUESTIONS OR CONCERTS: [phone]     Discharge Pin Site Instructions  Dress pins daily with Kerlix roll starting on POD 2. Wrap the Kerlix so that it tamps the skin down around the pin-skin interface to prevent/limit motion of the skin relative to the pin.  (Pin-skin motion is the primary cause of pain and infection related to external fixator pin sites).  Remove any crust or coagulum that may obstruct drainage with a saline moistened gauze or soap and water.  After POD 3, if there is no discernable drainage on the pin site dressing, the interval for change can by increased to every other day.  You may shower with the fixator, cleaning all pin sites gently with soap and water.  If you have a surgical wound this needs to be completely dry and without drainage before showering.  The extremity can be lifted by the fixator to facilitate wound care and transfers.  Notify the office/Doctor if you experience increasing drainage, redness, or pain from a pin site, or if you notice purulent (thick, snot-like) drainage.  Discharge Wound Care Instructions  Do NOT apply any ointments, solutions or lotions to pin sites or surgical wounds.  These prevent needed drainage and even though solutions  like hydrogen peroxide kill bacteria, they also damage cells lining the pin sites that help fight infection.  Applying lotions or  ointments can keep the wounds moist and can cause them to breakdown and open up as well. This can increase the risk for infection. When in doubt call the office.  Surgical incisions should be dressed daily.  If any drainage is noted, use one layer of adaptic, then gauze, Kerlix, and an ace wrap.  Once the incision is completely dry and without drainage, it may be left open to air out.  Showering may begin 36-48 hours later.  Cleaning gently with soap and water.  Traumatic wounds should be dressed daily as well.    One layer of adaptic, gauze, Kerlix, then ace wrap.  The adaptic can be discontinued once the draining has ceased    If you have a wet to dry dressing: wet the gauze with saline the squeeze as much saline out so the gauze is moist (not soaking wet), place moistened gauze over wound, then place a dry gauze over the moist one, followed by Kerlix wrap, then ace wrap.     Driving restrictions    Complete by:  As directed   No driving     Increase activity slowly as tolerated    Complete by:  As directed      Non weight bearing    Complete by:  As directed   Laterality:  left  Extremity:  Lower            Medication List    STOP taking these medications       nicotine 21 mg/24hr patch  Commonly known as:  NICODERM CQ - dosed in mg/24 hours      TAKE these medications       clonazePAM 1 MG tablet  Commonly known as:  KLONOPIN  Take 1 mg by mouth 3 (three) times daily.     DSS 100 MG Caps  Take 100 mg by mouth 2 (two) times daily.     enoxaparin 40 MG/0.4ML injection  Commonly known as:  LOVENOX  Inject 0.4 mLs (40 mg total) into the skin daily.     esomeprazole 40 MG capsule  Commonly known as:  NEXIUM  Take 40 mg by mouth at bedtime.     FANAPT 6 MG Tabs  Generic drug:  Iloperidone  Take 6 mg by mouth at bedtime.     fentaNYL 25 MCG/HR patch  Commonly known as:  DURAGESIC - dosed mcg/hr  Place 1 patch (25 mcg total) onto the skin every 3 (three) days.      levothyroxine 75 MCG tablet  Commonly known as:  SYNTHROID, LEVOTHROID  Take 75 mcg by mouth every evening.     nitroGLYCERIN 0.4 MG SL tablet  Commonly known as:  NITROSTAT  Place 1 tablet (0.4 mg total) under the tongue every 5 (five) minutes as needed for chest pain.     oxyCODONE-acetaminophen 10-325 MG per tablet  Commonly known as:  PERCOCET  Take 1 tablet by mouth every 6 (six) hours as needed for pain.     pravastatin 40 MG tablet  Commonly known as:  PRAVACHOL  Take 40 mg by mouth at bedtime.       Follow-up Information   Follow up with HANDY,MICHAEL H, MD. Schedule an appointment as soon as possible for a visit in 14 days. (For wound re-check, For suture removal)    Specialty:  Orthopedic Surgery   Contact information:  [address] [place] [address] [phone]       Schedule an appointment as soon as possible for a visit with Marciano Sequin . (medication refills , As needed)       Follow up with [facility]. (Someone from [place] will contact you concerning start date and time for home health physical therapy.)    Contact information:   [address] [place] [address] [phone]       Discharge Instructions and Plan:  Mr. Bartnik has sustained a fairly severe injury to the left knee. We were able to achieve excellent fixation with plate osteosynthesis. It was a technically successful procedure. We were able to restore alignment, assess for meniscal injury, address stability and restored joint surface congruity which are all favorable to a successful outcome. Patient is still at risk for the development of posttraumatic arthritis which has been discussed at length and we will continue to monitor the patient for signs and symptoms of such. Patient will be nonweightbearing for the next 6-8 weeks. Unrestricted range of motion of the left knee in the hinged knee brace. total knee precautions should be  followed when at rest We will refer him to outpatient physical therapy after the first postoperative visit. He will be on Lovenox for DVT/PE prophylaxis for 3 weeks Daily dressing changes should be performed as per discharge wound care instructions. Patient can resume prehospital diet  Pain control as written for by pain management physician, Dr. Mee Hives  We will see the patient back in the office in 10-14 days for reevaluation, followup x-rays and removal of sutures. Should the patient have any questions for the first postoperative visit and encouraged to contact the office immediately. Patient will be vigilant for any redness increased drainage and increased pain, which are suggestive of infection and will contact the office immediately. Patient will also be vigilant for fevers or chills or any other concerning signs. They will contact the office if any of these develop.  Remain cautiously optimistic for her healing potential however patient is at risk for nonunion, malunion and failure of hardware given his social habits. I am concerned that he is a bit of a fall risk given his chronic pain issues.  We also plan to check vitamin D and testosterone levels as an outpatient as low levels have been seen with chronic opioid use. This can directly impact his healing potential and I would like to correct this if needed to optimize his healing ability.   Signed:  Jari Pigg, PA-C [facility] [phone] (P) 10/28/2013, 1:43 PM  **Disclaimer: This note may have been dictated with voice recognition software. Similar sounding words can inadvertently be transcribed and this note may contain transcription errors which may not have been corrected upon publication of note.**

## 2013-11-13 ENCOUNTER — Encounter (HOSPITAL_COMMUNITY): Payer: Self-pay | Admitting: Emergency Medicine

## 2013-11-13 ENCOUNTER — Emergency Department (HOSPITAL_COMMUNITY)
Admission: EM | Admit: 2013-11-13 | Discharge: 2013-11-14 | Disposition: A | Discharge status: Home / Self Care | Payer: 59 | Attending: Emergency Medicine | Admitting: Emergency Medicine

## 2013-11-13 ENCOUNTER — Emergency Department (HOSPITAL_COMMUNITY): Payer: 59

## 2013-11-13 DIAGNOSIS — M5030 Other cervical disc degeneration, unspecified cervical region: Secondary | ICD-10-CM | POA: Insufficient documentation

## 2013-11-13 DIAGNOSIS — Z87828 Personal history of other (healed) physical injury and trauma: Secondary | ICD-10-CM | POA: Insufficient documentation

## 2013-11-13 DIAGNOSIS — R5383 Other fatigue: Secondary | ICD-10-CM

## 2013-11-13 DIAGNOSIS — Z9889 Other specified postprocedural states: Secondary | ICD-10-CM | POA: Insufficient documentation

## 2013-11-13 DIAGNOSIS — F411 Generalized anxiety disorder: Secondary | ICD-10-CM | POA: Insufficient documentation

## 2013-11-13 DIAGNOSIS — Z9181 History of falling: Secondary | ICD-10-CM | POA: Insufficient documentation

## 2013-11-13 DIAGNOSIS — E785 Hyperlipidemia, unspecified: Secondary | ICD-10-CM | POA: Insufficient documentation

## 2013-11-13 DIAGNOSIS — F319 Bipolar disorder, unspecified: Secondary | ICD-10-CM | POA: Insufficient documentation

## 2013-11-13 DIAGNOSIS — G8929 Other chronic pain: Secondary | ICD-10-CM | POA: Insufficient documentation

## 2013-11-13 DIAGNOSIS — IMO0002 Reserved for concepts with insufficient information to code with codable children: Secondary | ICD-10-CM | POA: Insufficient documentation

## 2013-11-13 DIAGNOSIS — M5090 Cervical disc disorder, unspecified, unspecified cervical region: Secondary | ICD-10-CM

## 2013-11-13 DIAGNOSIS — R5381 Other malaise: Secondary | ICD-10-CM | POA: Insufficient documentation

## 2013-11-13 DIAGNOSIS — Y939 Activity, unspecified: Secondary | ICD-10-CM | POA: Insufficient documentation

## 2013-11-13 DIAGNOSIS — Z8781 Personal history of (healed) traumatic fracture: Secondary | ICD-10-CM | POA: Insufficient documentation

## 2013-11-13 DIAGNOSIS — K219 Gastro-esophageal reflux disease without esophagitis: Secondary | ICD-10-CM | POA: Insufficient documentation

## 2013-11-13 DIAGNOSIS — Y929 Unspecified place or not applicable: Secondary | ICD-10-CM | POA: Insufficient documentation

## 2013-11-13 DIAGNOSIS — Z79899 Other long term (current) drug therapy: Secondary | ICD-10-CM | POA: Insufficient documentation

## 2013-11-13 DIAGNOSIS — E039 Hypothyroidism, unspecified: Secondary | ICD-10-CM | POA: Insufficient documentation

## 2013-11-13 DIAGNOSIS — R296 Repeated falls: Secondary | ICD-10-CM | POA: Insufficient documentation

## 2013-11-13 DIAGNOSIS — F17200 Nicotine dependence, unspecified, uncomplicated: Secondary | ICD-10-CM | POA: Insufficient documentation

## 2013-11-13 MED ORDER — MORPHINE SULFATE 4 MG/ML IJ SOLN
4.0000 mg | Freq: Once | INTRAMUSCULAR | Status: AC
  Administered 2013-11-13: 4 mg via INTRAVENOUS
  Filled 2013-11-13: qty 1

## 2013-11-13 MED ORDER — CLONAZEPAM 0.5 MG PO TABS
1.0000 mg | ORAL_TABLET | Freq: Once | ORAL | Status: AC
  Administered 2013-11-13: 1 mg via ORAL
  Filled 2013-11-13: qty 2

## 2013-11-13 NOTE — ED Notes (Signed)
2 x attempt for IV start; 2nd RN at bedside to attempt

## 2013-11-13 NOTE — ED Provider Notes (Signed)
CSN: [id number]     Arrival date & time 11/13/13  1713 History   First MD Initiated Contact with Patient 11/13/13 2105     Chief Complaint  Patient presents with  . Fall  . Numbness     (Consider location/radiation/quality/duration/timing/severity/associated sxs/prior Treatment) HPI Comments: [name] is a 48 y.o. Male with a significant PMHx of multiple falls, cervical spine injury with discectomy, and multiple fractures including recent tibial plateau fx s/p ORIF on 10/20/13. He states that since a fall two weeks ago, he has had intermittent numbness and weakness in his L arm, which he's never had before, and reports that it got worse 3 days ago. States he has neck pain, which is typical for him. His oxycodone and klonopin have not helped, last doses were this morning. Nothing makes his symptoms better. Endorses feeling weaker in the left arm, can't use his walker, and that his sensation is "different" in that arm, somewhat like it's fallen asleep. Denies vision changes, HA, fevers/chills, SOB, cough, abd pain, N/V/D/C, paresthesias in all other extremities, urinary symptoms, or incontinence. Endorses CP that started when he got back to the room, and stated he has anxiety and was late for his Klonopin dose. No recent travel, no sick contacts at home. No alcohol or illicit drug use.   Patient is a 48 y.o. male presenting with fall. The history is provided by the patient.  Fall This is a recurrent problem. The current episode started 1 to 4 weeks ago. The problem occurs intermittently. The problem has been unchanged. Associated symptoms include arthralgias (chronic), neck pain (chronic), numbness (left arm) and weakness (left arm). Pertinent negatives include no abdominal pain, change in bowel habit, chest pain, chills, congestion, coughing, diaphoresis, fatigue, fever, headaches, joint swelling, myalgias, nausea, rash, sore throat, urinary symptoms, vertigo, visual change or vomiting. Nothing  aggravates the symptoms. He has tried oral narcotics for the symptoms. The treatment provided no relief.    Past Medical History  Diagnosis Date  . Anxiety   . Bipolar affective disorder   . Chronic back pain   . DDD (degenerative disc disease)   . DJD (degenerative joint disease)   . GERD (gastroesophageal reflux disease)   . Head injury   . Hyperlipidemia   . Hypothyroid   . IBS (irritable bowel syndrome)   . Tibial plateau fracture, left   . Inguinal hernia     left   Past Surgical History  Procedure Laterality Date  . Back surgery    . Septoplasty    . Colonoscopy    . Spinal cord stimulator implant    . Orif tibia plateau Left 10/20/2013    Procedure: OPEN REDUCTION INTERNAL FIXATION (ORIF) LEFT TIBIAL PLATEAU;  Surgeon: Rozanna Box, MD;  Location: [facility];  Service: Orthopedics;  Laterality: Left;  . Fasciotomy Left 10/20/2013    Procedure: LEFT ANTERIOR COMPARTMENT FACSCIOTOMY;  Surgeon: Rozanna Box, MD;  Location: [facility];  Service: Orthopedics;  Laterality: Left;   Family History  Problem Relation Age of Onset  . Heart disease Mother   . Heart disease Father   . Diabetes Father   . Other Father   . Diabetes Brother    History  Substance Use Topics  . Smoking status: Current Every Day Smoker -- 1.00 packs/day for 33 years    Types: Cigarettes  . Smokeless tobacco: Never Used  . Alcohol Use: No    Review of Systems  Constitutional: Negative for fever, chills, diaphoresis and  fatigue.  HENT: Negative for congestion, ear pain, rhinorrhea, sinus pressure and sore throat.   Eyes: Negative for pain and visual disturbance.  Respiratory: Negative for cough and chest tightness.   Cardiovascular: Negative for chest pain and leg swelling.  Gastrointestinal: Negative for nausea, vomiting, abdominal pain, diarrhea, constipation, blood in stool, abdominal distention and change in bowel habit.  Genitourinary: Negative for dysuria, urgency, hematuria, flank pain and  difficulty urinating.  Musculoskeletal: Positive for arthralgias (chronic) and neck pain (chronic). Negative for back pain, joint swelling, myalgias and neck stiffness.  Skin: Negative for rash.  Neurological: Positive for weakness (left arm) and numbness (left arm). Negative for dizziness, vertigo, syncope, light-headedness and headaches.  Psychiatric/Behavioral: Negative for confusion.  All other systems reviewed and are negative.     Allergies  Zolpidem tartrate and Demerol  Home Medications   Prior to Admission medications   Medication Sig Start Date End Date Taking? Authorizing Provider  clonazePAM (KLONOPIN) 1 MG tablet Take 1 mg by mouth 3 (three) times daily.   Yes Historical Provider, MD  esomeprazole (NEXIUM) 40 MG capsule Take 40 mg by mouth at bedtime.   Yes Historical Provider, MD  fentaNYL (DURAGESIC - DOSED MCG/HR) 25 MCG/HR patch Place 1 patch (25 mcg total) onto the skin every 3 (three) days. 10/05/13  Yes Duwaine Maxin, DO  Iloperidone (FANAPT) 8 MG TABS Take 8 mg by mouth at bedtime.   Yes Historical Provider, MD  levothyroxine (SYNTHROID, LEVOTHROID) 75 MCG tablet Take 75 mcg by mouth every evening.   Yes Historical Provider, MD  nicotine (NICODERM CQ - DOSED IN MG/24 HOURS) 21 mg/24hr patch Place 21 mg onto the skin daily as needed (as needed).   Yes Historical Provider, MD  oxyCODONE-acetaminophen (PERCOCET) 10-325 MG per tablet Take 1 tablet by mouth every 6 (six) hours as needed for pain.   Yes Historical Provider, MD  pravastatin (PRAVACHOL) 40 MG tablet Take 40 mg by mouth at bedtime.    Yes Historical Provider, MD  nitroGLYCERIN (NITROSTAT) 0.4 MG SL tablet Place 1 tablet (0.4 mg total) under the tongue every 5 (five) minutes as needed for chest pain. 12/26/12   Sheila Oats, MD  predniSONE (DELTASONE) 20 MG tablet 3 tabs po daily x 3 days, then 2 tabs x 3 days, then 1.5 tabs x 3 days, then 1 tab x 3 days, then 0.5 tabs x 3 days 11/14/13   Jarrett Soho Muthersbaugh, PA-C    BP 122/80  Pulse 81  Temp(Src) 99.1 F (37.3 C) (Oral)  Resp 8  SpO2 96% Physical Exam  Nursing note and vitals reviewed. Constitutional: He is oriented to person, place, and time. Vital signs are normal. He appears well-developed and well-nourished. No distress.  HENT:  Head: Normocephalic and atraumatic.  Mouth/Throat: Oropharynx is clear and moist and mucous membranes are normal.  Eyes: Conjunctivae and EOM are normal. Pupils are equal, round, and reactive to light.  Neck: Normal range of motion. Neck supple. Spinous process tenderness (overlying prior surgery site, states this is chronic) and muscular tenderness (paracervical muscles, L side) present.  Neck mildly tender in midline, states this is chronic after his discectomy. States that lateral pressure on L side makes his arm numbness feel better. Mild TTP along left paracervical muscles and into trapezius, no spasms felt. ROM intact.  Cardiovascular: Normal rate, regular rhythm, normal heart sounds and intact distal pulses.   Pulmonary/Chest: Effort normal and breath sounds normal. He has no wheezes. He exhibits no tenderness.  Abdominal:  Soft. Normal appearance and bowel sounds are normal. He exhibits no distension. There is no tenderness. There is no rebound and no guarding.  Musculoskeletal:       Left shoulder: He exhibits tenderness (over paracervical muscles and trapezius). He exhibits normal range of motion, no bony tenderness, no swelling and no spasm.  ROM limited in L knee due to recent surgery. Full ROM of L shoulder intact. Strength equally and 5/5 b/l in all extremities. C-spine tenderness described above.   Neurological: He is alert and oriented to person, place, and time. He has normal strength. He displays tremor (chronic). A sensory deficit is present. No cranial nerve deficit. He exhibits normal muscle tone.  States he can feel soft touch in L arm, but it feels different than in R arm. Sensation grossly intact in  all other extremities. Strength equal throughout, and 5/5 b/l. Unable to assess gait and DTRs. Spinal stimulator scar located over T-spine  Skin: Skin is warm and dry.  Psychiatric: His mood appears anxious.  Appears anxious, flat affect.    ED Course  Procedures (including critical care time) Labs Review Labs Reviewed - No data to display  Imaging Review Ct Head Wo Contrast  11/14/2013   CLINICAL DATA:  Status post fall.  Left arm numbness.  EXAM: CT HEAD WITHOUT CONTRAST  CT CERVICAL SPINE WITHOUT CONTRAST  TECHNIQUE: Multidetector CT imaging of the head and cervical spine was performed following the standard protocol without intravenous contrast. Multiplanar CT image reconstructions of the cervical spine were also generated.  COMPARISON:  CT of the head and cervical spine performed 10/01/2013  FINDINGS: CT HEAD FINDINGS  There is no evidence of acute infarction, mass lesion, or intra- or extra-axial hemorrhage on CT.  The posterior fossa, including the cerebellum, brainstem and fourth ventricle, is within normal limits. The third and lateral ventricles, and basal ganglia are unremarkable in appearance. The cerebral hemispheres are symmetric in appearance, with normal gray-white differentiation. No mass effect or midline shift is seen.  There is no evidence of fracture; visualized osseous structures are unremarkable in appearance. The orbits are within normal limits. The paranasal sinuses and mastoid air cells are well-aerated. No significant soft tissue abnormalities are seen.  CT CERVICAL SPINE FINDINGS  There is no evidence of fracture or subluxation. The patient is status post anterior cervical spinal fusion at C4-C6. Vertebral bodies demonstrate normal height and alignment. Intervertebral disc spaces are preserved. Prevertebral soft tissues are within normal limits. Bony foraminal narrowing on the left side at C6-C7 is grossly stable from the prior study.  The thyroid gland is unremarkable in  appearance; the left thyroid lobe is diminutive. The visualized lung apices are clear. No significant soft tissue abnormalities are seen.  IMPRESSION: 1. No evidence of traumatic intracranial injury or fracture. 2. No evidence of fracture or subluxation along the cervical spine. 3. Status post anterior cervical spinal fusion at C4-C6; hardware appears grossly intact. 4. Bony foraminal narrowing on the left side at C6-C7 appears grossly stable from the prior study, though it could correspond to the patient's symptoms.   Electronically Signed   By: Garald Balding M.D.   On: 11/14/2013 00:02   Ct Cervical Spine Wo Contrast  11/14/2013   CLINICAL DATA:  Status post fall.  Left arm numbness.  EXAM: CT HEAD WITHOUT CONTRAST  CT CERVICAL SPINE WITHOUT CONTRAST  TECHNIQUE: Multidetector CT imaging of the head and cervical spine was performed following the standard protocol without intravenous contrast. Multiplanar CT image  reconstructions of the cervical spine were also generated.  COMPARISON:  CT of the head and cervical spine performed 10/01/2013  FINDINGS: CT HEAD FINDINGS  There is no evidence of acute infarction, mass lesion, or intra- or extra-axial hemorrhage on CT.  The posterior fossa, including the cerebellum, brainstem and fourth ventricle, is within normal limits. The third and lateral ventricles, and basal ganglia are unremarkable in appearance. The cerebral hemispheres are symmetric in appearance, with normal gray-white differentiation. No mass effect or midline shift is seen.  There is no evidence of fracture; visualized osseous structures are unremarkable in appearance. The orbits are within normal limits. The paranasal sinuses and mastoid air cells are well-aerated. No significant soft tissue abnormalities are seen.  CT CERVICAL SPINE FINDINGS  There is no evidence of fracture or subluxation. The patient is status post anterior cervical spinal fusion at C4-C6. Vertebral bodies demonstrate normal height  and alignment. Intervertebral disc spaces are preserved. Prevertebral soft tissues are within normal limits. Bony foraminal narrowing on the left side at C6-C7 is grossly stable from the prior study.  The thyroid gland is unremarkable in appearance; the left thyroid lobe is diminutive. The visualized lung apices are clear. No significant soft tissue abnormalities are seen.  IMPRESSION: 1. No evidence of traumatic intracranial injury or fracture. 2. No evidence of fracture or subluxation along the cervical spine. 3. Status post anterior cervical spinal fusion at C4-C6; hardware appears grossly intact. 4. Bony foraminal narrowing on the left side at C6-C7 appears grossly stable from the prior study, though it could correspond to the patient's symptoms.   Electronically Signed   By: Garald Balding M.D.   On: 11/14/2013 00:02   Dg Shoulder Left  11/13/2013   CLINICAL DATA:  Status post fall 1 month ago; left arm numbness.  EXAM: LEFT SHOULDER - 2+ VIEW  COMPARISON:  Left shoulder radiographs performed 11/13/2005  FINDINGS: There is no evidence of fracture or dislocation. The left humeral head is seated within the glenoid fossa. The acromioclavicular joint is unremarkable in appearance. No significant soft tissue abnormalities are seen. The visualized portions of the left lung are clear. Cervical spinal fusion hardware is noted.  IMPRESSION: No evidence of fracture or dislocation.   Electronically Signed   By: Garald Balding M.D.   On: 11/13/2013 23:31     EKG Interpretation   Date/Time:  Sunday November 13 2013 21:13:30 EDT Ventricular Rate:  80 PR Interval:  176 QRS Duration: 110 QT Interval:  364 QTC Calculation: 420 R Axis:   49 Text Interpretation:  Sinus rhythm RSR' in V1 or V2, right VCD or RVH No  significant change since last tracing Confirmed by [place]  [phone]) on 11/13/2013 9:27:09 PM      MDM   Final diagnoses:  Cervical disc disease    DARRILL VREELAND is a 48 y.o. male with  a hx of multiple falls and injuries, last fall two weeks ago, having increasing L arm numbness/weakness intermittently since and increased in the last 3 days. Denies any other neurological symptoms, and neuro exam unremarkable other than subjective difference in sensation. DDx includes CVA/TIA, although unlikely given timeline; cervical disc disease flare, given his hx of discectomy; muscle spasms, or anxiety. Will obtain head and neck CT, given that he cannot have an MRI due to his spinal stimulator. Will give home klonopin and morphine for pain, and reassess. Xray L shoulder to r/o any missed acute injury from fall two weeks ago.  12:15 AM CT head/c-spine reports "Bony foraminal narrowing on the left side at C6-C7 appears grossly stable from the prior study, though it could correspond to the patient's symptoms" and no other acute abnormalities. L shoulder xray unremarkable. At this time, do not believe this is a CVA/TIA given the timeline of symptoms and not worsening over three days, with no other focal neuro deficits on exam. I believe this is a flare of his cervical disc disease, as well as anxiety/chronic pain. He has a pain contract, therefore will treat pain here and d/c with home regimen but no rx for narcotics. Advised pt to follow up with neurosurgeon Dr. Annette Stable. Will give decadron here and 2 week dose pack with taper. Pt stable at d/c.  BP 122/80  Pulse 81  Temp(Src) 99.1 F (37.3 C) (Oral)  Resp 8  SpO2 96%      Travis Fancher Strupp Camprubi-Soms, PA-C 11/14/13 0040

## 2013-11-13 NOTE — ED Notes (Signed)
Pt reports having a fall on 5/1 and having intermittent left arm numbness since the fall. Reports increase in numbness x 3 days. Reports hx of herniated cervical disc. Also having left hip pain. Reports frequent falls. No acute distress noted at triage.

## 2013-11-13 NOTE — ED Notes (Signed)
PA at bedside.

## 2013-11-13 NOTE — ED Notes (Signed)
Pt states he had chest pain for 10 minutes under left rib cage; chest pain at 6/10; denies cardiac history; pt connected to cardiac monitor and EKG done

## 2013-11-13 NOTE — ED Notes (Signed)
Patient transported to CT 

## 2013-11-14 MED ORDER — HYDROMORPHONE HCL PF 1 MG/ML IJ SOLN
1.0000 mg | Freq: Once | INTRAMUSCULAR | Status: AC
  Administered 2013-11-14: 1 mg via INTRAVENOUS
  Filled 2013-11-14: qty 1

## 2013-11-14 MED ORDER — PREDNISONE 20 MG PO TABS: ORAL_TABLET | ORAL | Status: DC

## 2013-11-14 MED ORDER — DEXAMETHASONE SODIUM PHOSPHATE 10 MG/ML IJ SOLN
10.0000 mg | Freq: Once | INTRAMUSCULAR | Status: AC
  Administered 2013-11-14: 10 mg via INTRAMUSCULAR
  Filled 2013-11-14: qty 1

## 2013-11-14 NOTE — ED Provider Notes (Signed)
S: [name] is a 48 y.o. male presents to the ED with c/o left arm paresthesias and left neck pain x3 days.  Pt reports fall on 5/1 without head injury or LOC.  Hx of cervical discectomy.  Pt has spinal stimulator and therefore cannot have an MRI.     O: Face to face Exam:   General: Awake  HEENT: Atraumatic  Resp: Normal effort  Abd: Nondistended  Lymph: No adenopathy Mental Status:  Alert, oriented, thought content appropriate. Speech fluent without evidence of aphasia. Able to follow 2 step commands without difficulty.  Cranial Nerves:  II:  Peripheral visual fields grossly normal, pupils equal, round, reactive to light III,IV, VI: ptosis not present, extra-ocular motions intact bilaterally  V,VII: smile symmetric, facial light touch sensation equal VIII: hearing grossly normal bilaterally  IX,X: gag reflex present  XI: bilateral shoulder shrug equal and strong XII: midline tongue extension  Motor:  5/5 in upper and lower extremities bilaterally including strong and equal grip strength and dorsiflexion/plantar flexion Sensory: reported decreased but not absent sensation in the left upper extremity only.  Deep Tendon Reflexes: 2+ and symmetric  Cerebellar: normal finger-to-nose with bilateral upper extremities Gait: normal gait and balance CV: distal pulses palpable throughout   A/P:  Will CT head and neck.    12:58 AM CT head/neck with Bony foraminal narrowing on the left side at C6-C7 appears grossly stable from the prior study, though it could correspond to the patient's symptoms.  No acute findings.    I personally reviewed the imaging tests through PACS system  I reviewed available ER/hospitalization records through the EMR  In light of this we will give steroids and refer to neurosurgery outpatient.  Pt remains without additional focal neurologic deficits while here in the ED.  Pt was seen by Loralee Pacas and supervised by Abigail Butts, PA-C and Ernestina Patches, MD.     Abigail Butts, PA-C 11/14/13 5427

## 2013-11-14 NOTE — Discharge Instructions (Signed)
Please see the neurosurgeon listed in the follow up section to have further evaluation for your left arm numbness. Take the prednisone as prescribed, with breakfast. Return if you have any worsening symptoms, any numbness or weakness in one side of your body, vision changes, fevers, or neck stiffness different than your typical pain. Use your home regimen of your pain medication for pain.

## 2013-11-15 NOTE — ED Provider Notes (Signed)
Medical screening examination/treatment/procedure(s) were performed by non-physician practitioner and as supervising physician I was immediately available for consultation/collaboration.   Kyser Wandel E Hser Belanger, MD 11/15/13 1336 

## 2013-11-15 NOTE — ED Provider Notes (Signed)
Medical screening examination/treatment/procedure(s) were conducted as a shared visit with non-physician practitioner(s) and myself.  I personally evaluated the patient during the encounter. Pt presents w/ intermittent L hand numbness since fall 2 weeks ago, now constant for 3 days. On PE, but has no objective UE neuro findings (cannot fully test strength in LE due to tibial plateau fx). +ttp over L paracervial msk & trap. No acute findings in CT head or c-spine though pt does have L foraminal narrowing at C6-7 which may be cause of symptoms. Doubt CVA as symptoms seme peripheral and CT negative w/ ongoing symptoms. Rx for decadron given. Will not violate his pain contract with new narcotic Rx. He can f/u with his [facility] for further w/u     EKG Interpretation   Date/Time:  Sunday November 13 2013 21:13:30 EDT Ventricular Rate:  80 PR Interval:  176 QRS Duration: 110 QT Interval:  364 QTC Calculation: 420 R Axis:   49 Text Interpretation:  Sinus rhythm RSR' in V1 or V2, right VCD or RVH No  significant change since last tracing Confirmed by [place]  MD, [place]  [phone]) on 11/13/2013 9:27:09 PM        Neta Ehlers, MD 11/15/13 1905

## 2014-09-19 ENCOUNTER — Encounter: Payer: Self-pay | Admitting: Internal Medicine

## 2014-11-28 ENCOUNTER — Ambulatory Visit (INDEPENDENT_AMBULATORY_CARE_PROVIDER_SITE_OTHER): Payer: 59 | Admitting: Internal Medicine

## 2014-11-28 ENCOUNTER — Encounter: Payer: Self-pay | Admitting: Internal Medicine

## 2014-11-28 ENCOUNTER — Other Ambulatory Visit (INDEPENDENT_AMBULATORY_CARE_PROVIDER_SITE_OTHER): Payer: 59

## 2014-11-28 VITALS — BP 106/60 | HR 68 | Ht 71.5 in | Wt 225.2 lb

## 2014-11-28 DIAGNOSIS — R1011 Right upper quadrant pain: Secondary | ICD-10-CM

## 2014-11-28 DIAGNOSIS — R740 Nonspecific elevation of levels of transaminase and lactic acid dehydrogenase [LDH]: Secondary | ICD-10-CM

## 2014-11-28 DIAGNOSIS — R748 Abnormal levels of other serum enzymes: Secondary | ICD-10-CM

## 2014-11-28 DIAGNOSIS — R1013 Epigastric pain: Secondary | ICD-10-CM

## 2014-11-28 LAB — HEPATIC FUNCTION PANEL
ALT: 17 U/L (ref 0–53)
AST: 20 U/L (ref 0–37)
Albumin: 3.8 g/dL (ref 3.5–5.2)
Alkaline Phosphatase: 97 U/L (ref 39–117)
Bilirubin, Direct: 0 mg/dL (ref 0.0–0.3)
TOTAL PROTEIN: 6.5 g/dL (ref 6.0–8.3)
Total Bilirubin: 0.3 mg/dL (ref 0.2–1.2)

## 2014-11-28 LAB — LIPASE: Lipase: 13 U/L (ref 11.0–59.0)

## 2014-11-28 LAB — AMYLASE: AMYLASE: 24 U/L — AB (ref 27–131)

## 2014-11-28 MED ORDER — PROMETHAZINE HCL 25 MG PO TABS: 25.0000 mg | ORAL_TABLET | Freq: Four times a day (QID) | ORAL | Status: DC | PRN

## 2014-11-28 MED ORDER — DIPHENOXYLATE-ATROPINE 2.5-0.025 MG PO TABS: 1.0000 | ORAL_TABLET | Freq: Four times a day (QID) | ORAL | Status: DC | PRN

## 2014-11-28 NOTE — Progress Notes (Addendum)
Subjective:    Patient ID: [name], male    DOB: 1966/05/05, 49 y.o.   MRN: [id number] Chief Complaint: Abdominal pain, nausea vomiting and diarrhea HPI The patient is here today with complaints of abdominal pain in the epigastrium and right upper quadrant that radiates to the back. They're intermittent when severe but there is sort of a dull pain there all the time. He was seen by primary care at [facility] and ultrasound demonstrated a thickened gallbladder wall and borderline hepatomegaly. He has abnormal transaminases though an acute hepatitis panel was negative. Pancreas is reported to be infiltrated by fat. He takes chronic narcotics but this pain is bothering him midnight despite that. He has had some nausea and vomiting though mostly nausea. No fever reported. Loose stools and has had the use some Lomotil but not for the last couple of days as stools are formed then. He does have a history of IBS. I haven't seen him in many years for this. With that there has been no reports of rectal bleeding or melanoma or hematochezia. His current problems that are bothering him I been lasting for 3 months or so.  Allergies  Allergen Reactions  . Zolpidem Tartrate Other (See Comments)    Hallucinations and sleep walks  . Demerol [Meperidine] Itching and Rash   Outpatient Prescriptions Prior to Visit  Medication Sig Dispense Refill  . esomeprazole (NEXIUM) 40 MG capsule Take 40 mg by mouth at bedtime.    Marland Kitchen levothyroxine (SYNTHROID, LEVOTHROID) 75 MCG tablet Take 75 mcg by mouth every evening.    . nitroGLYCERIN (NITROSTAT) 0.4 MG SL tablet Place 1 tablet (0.4 mg total) under the tongue every 5 (five) minutes as needed for chest pain. 20 tablet 0  . pravastatin (PRAVACHOL) 40 MG tablet Take 40 mg by mouth at bedtime.     . clonazePAM (KLONOPIN) 1 MG tablet Take 1 mg by mouth 3 (three) times daily.    . fentaNYL (DURAGESIC - DOSED MCG/HR) 25 MCG/HR patch Place 1 patch (25 mcg total) onto the  skin every 3 (three) days. 5 patch 0  . Iloperidone (FANAPT) 8 MG TABS Take 8 mg by mouth at bedtime.    . nicotine (NICODERM CQ - DOSED IN MG/24 HOURS) 21 mg/24hr patch Place 21 mg onto the skin daily as needed (as needed).    Marland Kitchen oxyCODONE-acetaminophen (PERCOCET) 10-325 MG per tablet Take 1 tablet by mouth every 6 (six) hours as needed for pain.    . predniSONE (DELTASONE) 20 MG tablet 3 tabs po daily x 3 days, then 2 tabs x 3 days, then 1.5 tabs x 3 days, then 1 tab x 3 days, then 0.5 tabs x 3 days 27 tablet 0   No facility-administered medications prior to visit.   Past Medical History  Diagnosis Date  . Anxiety   . Bipolar affective disorder   . Chronic back pain   . DDD (degenerative disc disease)   . DJD (degenerative joint disease)   . GERD (gastroesophageal reflux disease)   . Head injury   . Hyperlipidemia   . Hypothyroidism   . IBS (irritable bowel syndrome)   . Tibial plateau fracture, left   . Inguinal hernia     left  . Osteopenia   . Myofascial pain syndrome   . Arthritis   . Depression   . Fibromyalgia    Past Surgical History  Procedure Laterality Date  . Cervical disc surgery      C5-7  .  Septoplasty    . Colonoscopy    . Spinal cord stimulator implant    . Orif tibia plateau Left 10/20/2013    Procedure: OPEN REDUCTION INTERNAL FIXATION (ORIF) LEFT TIBIAL PLATEAU;  Surgeon: Rozanna Box, MD;  Location: [place];  Service: Orthopedics;  Laterality: Left;  . Fasciotomy Left 10/20/2013    Procedure: LEFT ANTERIOR COMPARTMENT FACSCIOTOMY;  Surgeon: Rozanna Box, MD;  Location: [place];  Service: Orthopedics;  Laterality: Left;  . Inguinal hernia repair Left    History   Social History  . Marital Status: Married    Spouse Name: N/A  . Number of Children: 1  . Years of Education: N/A   Occupational History  . disabled    Social History Main Topics  . Smoking status: Current Some Day Smoker -- 1.00 packs/day for 33 years    Types: Cigarettes  .  Smokeless tobacco: Never Used  . Alcohol Use: No  . Drug Use: No  . Sexual Activity: Not on file   Other Topics Concern  . None   Social History Narrative   Family History  Problem Relation Age of Onset  . Heart disease Mother   . Heart disease Father   . Diabetes Father   . Kidney disease Father   . Diabetes Brother   . Lung cancer Maternal Grandmother   . Diabetes Paternal Grandmother   . Kidney disease Brother     Review of Systems Few sleep positive with chronic aches and pains and back pain. He suffers from anxiety and depression as well that are relatively controlled at this point. He is very frustrated by feeling bad at this time.    Objective:   Physical Exam @BP  106/60 mmHg  Pulse 68  Ht 5' 11.5" (1.816 m)  Wt 225 lb 4 oz (102.173 kg)  BMI 30.98 kg/m2@  General:  Well-developed, well-nourished and in no acute distress Eyes:  anicteric. ENT:   Mouth and posterior pharynx free of lesions.  Neck:   supple w/o thyromegaly or mass.  Lungs: Clear to auscultation bilaterally. Heart:  S1S2, no rubs, murmurs, gallops. Abdomen:  soft, non-tender, no hepatosplenomegaly, hernia, or mass and BS+.  Rectal: Formed brown stool, nontender, no mass, nl anoderm Lymph:  no cervical or supraclavicular adenopathy. Extremities:   no edema, cyanosis or clubbing Skin   no acute rash. Neuro:  A&O x 3.  Psych:  appropriate mood and  Affect.   Data Reviewed: Ultrasound report from [facility] from April.  Primary care notes from April. Lab reports with transaminases showing AST 75 and ALT 91 and they both should be less than 50. Bilirubin is normal albumen normal. Electrolyte sig kidney function normal. He has cholesterol 226 triglycerides 178 HDL 68 LDL 122. TSH is normal. Acute hepatitis panel negative.     Assessment & Plan:  Abdominal pain, epigastric  RUQ pain  Abnormal transaminases   Promethazine, Lomotil prn, prescriptions provided HIDA can with ejection  fraction, his symptoms sound like they could be gallbladder in origin it could be an exacerbation of his IBS as well. Further plans pending these results.  CC: Isaias Cowman, PA-C

## 2014-11-28 NOTE — Patient Instructions (Addendum)
Your physician has requested that you go to the basement for the following lab work before leaving today: LFT's, Amylase, Lipase  We have sent the following medications to your pharmacy for you to pick up at your convenience: Promethazine  We are giving you a printed rx for the Lomotil to take to your pharmacy.   You have been scheduled for a HIDA scan at [facility] (1st floor) on 12/19/14. Please arrive 15 minutes prior to your scheduled appointment at  1:61WR. Make certain not to have anything to eat or drink at least 6 hours prior to your test. Should this appointment date or time not work well for you, please call radiology scheduling at [phone].  _____________________________________________________________________ hepatobiliary (HIDA) scan is an imaging procedure used to diagnose problems in the liver, gallbladder and bile ducts. In the HIDA scan, a radioactive chemical or tracer is injected into a vein in your arm. The tracer is handled by the liver like bile. Bile is a fluid produced and excreted by your liver that helps your digestive system break down fats in the foods you eat. Bile is stored in your gallbladder and the gallbladder releases the bile when you eat a meal. A special nuclear medicine scanner (gamma camera) tracks the flow of the tracer from your liver into your gallbladder and small intestine.  During your HIDA scan  You'll be asked to change into a hospital gown before your HIDA scan begins. Your health care team will position you on a table, usually on your back. The radioactive tracer is then injected into a vein in your arm.The tracer travels through your bloodstream to your liver, where it's taken up by the bile-producing cells. The radioactive tracer travels with the bile from your liver into your gallbladder and through your bile ducts to your small intestine.You may feel some pressure while the radioactive tracer is injected into your vein. As you lie on the  table, a special gamma camera is positioned over your abdomen taking pictures of the tracer as it moves through your body. The gamma camera takes pictures continually for about an hour. You'll need to keep still during the HIDA scan. This can become uncomfortable, but you may find that you can lessen the discomfort by taking deep breaths and thinking about other things. Tell your health care team if you're uncomfortable. The radiologist will watch on a computer the progress of the radioactive tracer through your body. The HIDA scan may be stopped when the radioactive tracer is seen in the gallbladder and enters your small intestine. This typically takes about an hour. In some cases extra imaging will be performed if original images aren't satisfactory, if morphine is given to help visualize the gallbladder or if the medication CCK is given to look at the contraction of the gallbladder. This test typically takes 2 hours to complete. ________________________________________________________________________   I appreciate the opportunity to care for you. Silvano Rusk, MD, [facility]

## 2014-12-05 NOTE — Progress Notes (Signed)
Quick Note:  Labs ok Waiting on HIDA  ______

## 2014-12-19 ENCOUNTER — Ambulatory Visit (HOSPITAL_COMMUNITY)
Admission: RE | Admit: 2014-12-19 | Discharge: 2014-12-19 | Disposition: A | Discharge status: Home / Self Care | Payer: 59 | Source: Ambulatory Visit | Attending: Internal Medicine | Admitting: Internal Medicine

## 2014-12-19 DIAGNOSIS — R1013 Epigastric pain: Secondary | ICD-10-CM

## 2014-12-19 DIAGNOSIS — R748 Abnormal levels of other serum enzymes: Secondary | ICD-10-CM

## 2014-12-19 DIAGNOSIS — R1011 Right upper quadrant pain: Secondary | ICD-10-CM

## 2015-01-01 ENCOUNTER — Encounter (HOSPITAL_COMMUNITY): Payer: 59

## 2015-01-25 ENCOUNTER — Ambulatory Visit (HOSPITAL_COMMUNITY)
Admission: RE | Admit: 2015-01-25 | Discharge: 2015-01-25 | Disposition: A | Discharge status: Home / Self Care | Payer: 59 | Source: Ambulatory Visit | Attending: Internal Medicine | Admitting: Internal Medicine

## 2015-01-25 DIAGNOSIS — R740 Nonspecific elevation of levels of transaminase and lactic acid dehydrogenase [LDH]: Secondary | ICD-10-CM | POA: Diagnosis present

## 2015-01-25 DIAGNOSIS — R1011 Right upper quadrant pain: Secondary | ICD-10-CM | POA: Diagnosis present

## 2015-01-25 DIAGNOSIS — R1013 Epigastric pain: Secondary | ICD-10-CM | POA: Insufficient documentation

## 2015-01-25 MED ORDER — TECHNETIUM TC 99M MEBROFENIN IV KIT
5.5000 | PACK | Freq: Once | INTRAVENOUS | Status: DC | PRN
  Administered 2015-01-25: 6 via INTRAVENOUS
  Filled 2015-01-25: qty 6

## 2015-01-25 MED ORDER — SINCALIDE 5 MCG IJ SOLR: 0.0200 ug/kg | Freq: Once | INTRAMUSCULAR | Status: DC

## 2015-01-26 NOTE — Progress Notes (Signed)
Quick Note:  Gallbladder working normal;ly reducses chances this is cause of pain Is he still having epigastric pain? ______

## 2015-01-26 NOTE — Progress Notes (Signed)
Quick Note:  Can you see if there are any procedure reports anywhere ? Cori 2003 - cannot see any in Epic ______

## 2015-02-01 NOTE — Progress Notes (Signed)
Quick Note:  Need to set up EGD and colonoscopy - dx epigastric pain, diarrhea ______

## 2015-02-19 ENCOUNTER — Ambulatory Visit (AMBULATORY_SURGERY_CENTER): Payer: Self-pay

## 2015-02-19 VITALS — Ht 72.0 in | Wt 224.8 lb

## 2015-02-19 DIAGNOSIS — R1013 Epigastric pain: Secondary | ICD-10-CM

## 2015-02-19 NOTE — Progress Notes (Signed)
No allergies to eggs or soy No diet/weight loss meds No home oxygen No past problems with anesthesia Has stimulator Refused emmi no computer

## 2015-02-20 HISTORY — PX: TRANSTHORACIC ECHOCARDIOGRAM: SHX275

## 2015-02-20 HISTORY — PX: OTHER SURGICAL HISTORY: SHX169

## 2015-03-06 HISTORY — PX: OTHER SURGICAL HISTORY: SHX169

## 2015-03-08 ENCOUNTER — Encounter: Payer: Self-pay | Admitting: Internal Medicine

## 2015-03-08 ENCOUNTER — Ambulatory Visit (AMBULATORY_SURGERY_CENTER): Payer: 59 | Admitting: Internal Medicine

## 2015-03-08 VITALS — BP 102/68 | HR 55 | Temp 96.1°F | Resp 30 | Wt 225.0 lb

## 2015-03-08 DIAGNOSIS — R197 Diarrhea, unspecified: Secondary | ICD-10-CM | POA: Diagnosis not present

## 2015-03-08 DIAGNOSIS — R1013 Epigastric pain: Secondary | ICD-10-CM

## 2015-03-08 MED ORDER — SODIUM CHLORIDE 0.9 % IV SOLN: 500.0000 mL | INTRAVENOUS | Status: DC

## 2015-03-08 MED ORDER — HYOSCYAMINE SULFATE ER 0.375 MG PO TB12: 0.3750 mg | ORAL_TABLET | Freq: Two times a day (BID) | ORAL | Status: DC | PRN

## 2015-03-08 NOTE — Patient Instructions (Addendum)
I found the following:  1) Possible esophagitis - biopsies taken 2) Otherwise all normal - colon biopsies taken to look for microscopic colitis  Will call with results/plans  I appreciate the opportunity to care for you. Gatha Mayer, MD, [facility]  Discharge instructions given. Biopsies taken. Resume previous medications. YOU HAD AN ENDOSCOPIC PROCEDURE TODAY AT [facility]:   Refer to the procedure report that was given to you for any specific questions about what was found during the examination.  If the procedure report does not answer your questions, please call your gastroenterologist to clarify.  If you requested that your care partner not be given the details of your procedure findings, then the procedure report has been included in a sealed envelope for you to review at your convenience later.  YOU SHOULD EXPECT: Some feelings of bloating in the abdomen. Passage of more gas than usual.  Walking can help get rid of the air that was put into your GI tract during the procedure and reduce the bloating. If you had a lower endoscopy (such as a colonoscopy or flexible sigmoidoscopy) you may notice spotting of blood in your stool or on the toilet paper. If you underwent a bowel prep for your procedure, you may not have a normal bowel movement for a few days.  Please Note:  You might notice some irritation and congestion in your nose or some drainage.  This is from the oxygen used during your procedure.  There is no need for concern and it should clear up in a day or so.  SYMPTOMS TO REPORT IMMEDIATELY:   Following lower endoscopy (colonoscopy or flexible sigmoidoscopy):  Excessive amounts of blood in the stool  Significant tenderness or worsening of abdominal pains  Swelling of the abdomen that is new, acute  Fever of 100F or higher   Following upper endoscopy (EGD)  Vomiting of blood or coffee ground material  New chest pain or pain under the shoulder  blades  Painful or persistently difficult swallowing  New shortness of breath  Fever of 100F or higher  Black, tarry-looking stools  For urgent or emergent issues, a gastroenterologist can be reached at any hour by calling [phone].   DIET: Your first meal following the procedure should be a small meal and then it is ok to progress to your normal diet. Heavy or fried foods are harder to digest and may make you feel nauseous or bloated.  Likewise, meals heavy in dairy and vegetables can increase bloating.  Drink plenty of fluids but you should avoid alcoholic beverages for 24 hours.  ACTIVITY:  You should plan to take it easy for the rest of today and you should NOT DRIVE or use heavy machinery until tomorrow (because of the sedation medicines used during the test).    FOLLOW UP: Our staff will call the number listed on your records the next business day following your procedure to check on you and address any questions or concerns that you may have regarding the information given to you following your procedure. If we do not reach you, we will leave a message.  However, if you are feeling well and you are not experiencing any problems, there is no need to return our call.  We will assume that you have returned to your regular daily activities without incident.  If any biopsies were taken you will be contacted by phone or by letter within the next 1-3 weeks.  Please call us at [phone]  [phone] if you have not heard about the biopsies in 3 weeks.    SIGNATURES/CONFIDENTIALITY: You and/or your care partner have signed paperwork which will be entered into your electronic medical record.  These signatures attest to the fact that that the information above on your After Visit Summary has been reviewed and is understood.  Full responsibility of the confidentiality of this discharge information lies with you and/or your care-partner.

## 2015-03-08 NOTE — Op Note (Addendum)
[place]  [address] [place], [id number]   COLONOSCOPY PROCEDURE REPORT  PATIENT: [name], [name]  MR#: [id number] BIRTHDATE: [birth year] , 17  yrs. old GENDER: male ENDOSCOPIST: Gatha Mayer, MD, [facility] PROCEDURE DATE:  03/08/2015 PROCEDURE:   Colonoscopy, diagnostic and Colonoscopy with biopsy First Screening Colonoscopy - Avg.  risk and is 50 yrs.  old or older - No.  Prior Negative Screening - Now for repeat screening. N/A  History of Adenoma - Now for follow-up colonoscopy & has been > or = to 3 yrs.  N/A  Polyps removed today? No Recommend repeat exam, <10 yrs? No ASA CLASS:   Class III INDICATIONS:Clinically significant diarrhea of unexplained origin and Patient is not applicable for Colorectal Neoplasm Risk Assessment for this procedure. MEDICATIONS: Residual sedation present, Propofol 50 mg IV, and Monitored anesthesia care  DESCRIPTION OF PROCEDURE:   After the risks benefits and alternatives of the procedure were thoroughly explained, informed consent was obtained.  The digital rectal exam revealed no abnormalities of the rectum, revealed no prostatic nodules, and revealed the prostate was not enlarged.   The LB [id number] endoscope was introduced through the anus and advanced to the terminal ileum which was intubated for a short distance. No adverse events experienced.   The quality of the prep was excellent. (MiraLax was used)  The instrument was then slowly withdrawn as the colon was fully examined. Estimated blood loss is zero unless otherwise noted in this procedure report.  COLON FINDINGS: The examined terminal ileum appeared to be normal. The colonic mucosa appeared normal throughout the entire examined colon.  Multiple random biopsies were performed using cold forceps. Samples were sent to R/O microscopic colitis.  Retroflexed views revealed no abnormalities. The time to cecum = 3.7 Withdrawal time = 8.9   The scope was withdrawn  and the procedure completed. COMPLICATIONS: There were no immediate complications.  ENDOSCOPIC IMPRESSION: 1.   The examined terminal ileum appeared to be normal 2.   The colonic mucosa appeared normal throughout the entire examined colon; multiple random biopsies were performed using cold forceps  RECOMMENDATIONS: 1.  Await pathology results 2.  Office will call with the results. 3.  Repeat colonoscopy 10 years. 4. Hyoscyamine 0.375 mg bid prn Rx  eSigned:  Gatha Mayer, MD, [facility] 03/08/2015 4:02 PM Revised: 03/08/2015 4:02 PM  cc: The Patient and Isaias Cowman, PA-C   PATIENT NAME:  [name], [name] MR#: [id number]

## 2015-03-08 NOTE — Op Note (Signed)
[place]  [address] [place], [id number]   ENDOSCOPY PROCEDURE REPORT  PATIENT: Travis, Boyd  MR#: [id number] BIRTHDATE: 1966-01-18 , 63  yrs. old GENDER: male ENDOSCOPIST: Gatha Mayer, MD, [facility] PROCEDURE DATE:  03/08/2015 PROCEDURE:  EGD w/ biopsy ASA CLASS:     Class III INDICATIONS:  epigastric pain. MEDICATIONS: Propofol 150 mg IV and Monitored anesthesia care TOPICAL ANESTHETIC: none  DESCRIPTION OF PROCEDURE: After the risks benefits and alternatives of the procedure were thoroughly explained, informed consent was obtained.  The LB [id number] endoscope was introduced through the mouth and advanced to the second portion of the duodenum , Without limitations.  The instrument was slowly withdrawn as the mucosa was fully examined.    1)Variable mucosal color in mid and distal esophagus - salmon and pink-white.  ? esophagits, ? Barrett's - not typical for that. Biopsies taken. 2) Otherwise normal EGD.  Retroflexed views revealed no abnormalities.     The scope was then withdrawn from the patient and the procedure completed.  COMPLICATIONS: There were no immediate complications.  ENDOSCOPIC IMPRESSION: 1)Variable mucosal color in mid and distal esophagus - salmon and pink-white.  ? esophagits, ? Barrett's - not typical for that. Biopsies taken. 2) Otherwise normal EGD  RECOMMENDATIONS: 1.  Await pathology results 2.  Proceed with a Colonoscopy.   eSigned:  Gatha Mayer, MD, [facility] 03/08/2015 3:48 PM    CC: The Patient and Isaias Cowman PA-C

## 2015-03-08 NOTE — Progress Notes (Signed)
Called to room to assist during endoscopic procedure.  Patient ID and intended procedure confirmed with present staff. Received instructions for my participation in the procedure from the performing physician.  

## 2015-03-08 NOTE — Progress Notes (Signed)
Report to PACU, RN, vss, BBS= Clear.  

## 2015-03-09 ENCOUNTER — Telehealth: Payer: Self-pay | Admitting: *Deleted

## 2015-03-09 NOTE — Telephone Encounter (Signed)
  Follow up Call-  Call back number 03/08/2015  Post procedure Call Back phone  # [phone]  Permission to leave phone message No  comments no answering machine      Patient questions:  Do you have a fever, pain , or abdominal swelling? No. Pain Score  0 *  Have you tolerated food without any problems? Yes.    Have you been able to return to your normal activities? Yes.    Do you have any questions about your discharge instructions: Diet   No. Medications  No. Follow up visit  No.  Do you have questions or concerns about your Care? No.  Actions: * If pain score is 4 or above: No action needed, pain <4.

## 2015-03-21 NOTE — Progress Notes (Signed)
Quick Note:  Esophageal and colon bxs are normal Please call from office and get sx update - is he ok on glycopyrrolate?  LEC - no letter 10 yr colon recall no EGD recall ______

## 2015-03-23 NOTE — Progress Notes (Signed)
Quick Note:  Levbid - correct Will continue ______

## 2015-04-09 ENCOUNTER — Ambulatory Visit: Payer: 59 | Admitting: Internal Medicine

## 2015-06-03 DIAGNOSIS — N189 Chronic kidney disease, unspecified: Secondary | ICD-10-CM

## 2015-06-03 HISTORY — DX: Chronic kidney disease, unspecified: N18.9

## 2015-06-19 DIAGNOSIS — R739 Hyperglycemia, unspecified: Secondary | ICD-10-CM | POA: Diagnosis not present

## 2015-06-19 DIAGNOSIS — E039 Hypothyroidism, unspecified: Secondary | ICD-10-CM | POA: Diagnosis not present

## 2015-06-19 DIAGNOSIS — Z79899 Other long term (current) drug therapy: Secondary | ICD-10-CM | POA: Diagnosis not present

## 2015-06-19 DIAGNOSIS — M129 Arthropathy, unspecified: Secondary | ICD-10-CM | POA: Diagnosis not present

## 2015-06-19 DIAGNOSIS — E559 Vitamin D deficiency, unspecified: Secondary | ICD-10-CM | POA: Diagnosis not present

## 2015-06-19 DIAGNOSIS — E782 Mixed hyperlipidemia: Secondary | ICD-10-CM | POA: Diagnosis not present

## 2015-06-22 DIAGNOSIS — M5136 Other intervertebral disc degeneration, lumbar region: Secondary | ICD-10-CM | POA: Diagnosis not present

## 2015-06-22 DIAGNOSIS — M25562 Pain in left knee: Secondary | ICD-10-CM | POA: Diagnosis not present

## 2015-06-22 DIAGNOSIS — Z720 Tobacco use: Secondary | ICD-10-CM | POA: Diagnosis not present

## 2015-06-22 DIAGNOSIS — M5412 Radiculopathy, cervical region: Secondary | ICD-10-CM | POA: Diagnosis not present

## 2015-06-22 DIAGNOSIS — G894 Chronic pain syndrome: Secondary | ICD-10-CM | POA: Diagnosis not present

## 2015-06-22 DIAGNOSIS — M545 Low back pain: Secondary | ICD-10-CM | POA: Diagnosis not present

## 2015-06-22 DIAGNOSIS — F1120 Opioid dependence, uncomplicated: Secondary | ICD-10-CM | POA: Diagnosis not present

## 2015-06-22 DIAGNOSIS — F419 Anxiety disorder, unspecified: Secondary | ICD-10-CM | POA: Diagnosis not present

## 2015-06-22 DIAGNOSIS — Q761 Klippel-Feil syndrome: Secondary | ICD-10-CM | POA: Diagnosis not present

## 2015-06-27 DIAGNOSIS — F319 Bipolar disorder, unspecified: Secondary | ICD-10-CM | POA: Diagnosis not present

## 2015-06-29 DIAGNOSIS — F319 Bipolar disorder, unspecified: Secondary | ICD-10-CM | POA: Diagnosis not present

## 2015-07-02 DIAGNOSIS — F3132 Bipolar disorder, current episode depressed, moderate: Secondary | ICD-10-CM | POA: Diagnosis not present

## 2015-07-02 DIAGNOSIS — F319 Bipolar disorder, unspecified: Secondary | ICD-10-CM | POA: Diagnosis not present

## 2015-07-02 DIAGNOSIS — F411 Generalized anxiety disorder: Secondary | ICD-10-CM | POA: Diagnosis not present

## 2015-07-04 DIAGNOSIS — F319 Bipolar disorder, unspecified: Secondary | ICD-10-CM | POA: Diagnosis not present

## 2015-07-06 DIAGNOSIS — F319 Bipolar disorder, unspecified: Secondary | ICD-10-CM | POA: Diagnosis not present

## 2015-07-09 DIAGNOSIS — F319 Bipolar disorder, unspecified: Secondary | ICD-10-CM | POA: Diagnosis not present

## 2015-07-11 DIAGNOSIS — F319 Bipolar disorder, unspecified: Secondary | ICD-10-CM | POA: Diagnosis not present

## 2015-07-13 DIAGNOSIS — E039 Hypothyroidism, unspecified: Secondary | ICD-10-CM | POA: Diagnosis not present

## 2015-07-13 DIAGNOSIS — E559 Vitamin D deficiency, unspecified: Secondary | ICD-10-CM | POA: Diagnosis not present

## 2015-07-13 DIAGNOSIS — F319 Bipolar disorder, unspecified: Secondary | ICD-10-CM | POA: Diagnosis not present

## 2015-07-13 DIAGNOSIS — R079 Chest pain, unspecified: Secondary | ICD-10-CM | POA: Diagnosis not present

## 2015-07-13 DIAGNOSIS — E782 Mixed hyperlipidemia: Secondary | ICD-10-CM | POA: Diagnosis not present

## 2015-07-16 DIAGNOSIS — F319 Bipolar disorder, unspecified: Secondary | ICD-10-CM | POA: Diagnosis not present

## 2015-07-18 DIAGNOSIS — F319 Bipolar disorder, unspecified: Secondary | ICD-10-CM | POA: Diagnosis not present

## 2015-07-20 DIAGNOSIS — F319 Bipolar disorder, unspecified: Secondary | ICD-10-CM | POA: Diagnosis not present

## 2015-07-23 DIAGNOSIS — F419 Anxiety disorder, unspecified: Secondary | ICD-10-CM | POA: Diagnosis not present

## 2015-07-23 DIAGNOSIS — Q761 Klippel-Feil syndrome: Secondary | ICD-10-CM | POA: Diagnosis not present

## 2015-07-23 DIAGNOSIS — G894 Chronic pain syndrome: Secondary | ICD-10-CM | POA: Diagnosis not present

## 2015-07-23 DIAGNOSIS — M545 Low back pain: Secondary | ICD-10-CM | POA: Diagnosis not present

## 2015-07-23 DIAGNOSIS — Z720 Tobacco use: Secondary | ICD-10-CM | POA: Diagnosis not present

## 2015-07-23 DIAGNOSIS — M5412 Radiculopathy, cervical region: Secondary | ICD-10-CM | POA: Diagnosis not present

## 2015-07-23 DIAGNOSIS — M25562 Pain in left knee: Secondary | ICD-10-CM | POA: Diagnosis not present

## 2015-07-23 DIAGNOSIS — F319 Bipolar disorder, unspecified: Secondary | ICD-10-CM | POA: Diagnosis not present

## 2015-07-23 DIAGNOSIS — F1120 Opioid dependence, uncomplicated: Secondary | ICD-10-CM | POA: Diagnosis not present

## 2015-07-25 DIAGNOSIS — F319 Bipolar disorder, unspecified: Secondary | ICD-10-CM | POA: Diagnosis not present

## 2015-07-26 DIAGNOSIS — Z8249 Family history of ischemic heart disease and other diseases of the circulatory system: Secondary | ICD-10-CM | POA: Diagnosis not present

## 2015-07-26 DIAGNOSIS — E782 Mixed hyperlipidemia: Secondary | ICD-10-CM | POA: Diagnosis not present

## 2015-07-26 DIAGNOSIS — R079 Chest pain, unspecified: Secondary | ICD-10-CM | POA: Diagnosis not present

## 2015-07-26 DIAGNOSIS — I6529 Occlusion and stenosis of unspecified carotid artery: Secondary | ICD-10-CM | POA: Diagnosis not present

## 2015-07-27 DIAGNOSIS — F319 Bipolar disorder, unspecified: Secondary | ICD-10-CM | POA: Diagnosis not present

## 2015-08-06 DIAGNOSIS — G894 Chronic pain syndrome: Secondary | ICD-10-CM | POA: Diagnosis not present

## 2015-08-06 DIAGNOSIS — M545 Low back pain: Secondary | ICD-10-CM | POA: Diagnosis not present

## 2015-08-06 DIAGNOSIS — F1120 Opioid dependence, uncomplicated: Secondary | ICD-10-CM | POA: Diagnosis not present

## 2015-08-06 DIAGNOSIS — Q761 Klippel-Feil syndrome: Secondary | ICD-10-CM | POA: Diagnosis not present

## 2015-08-06 DIAGNOSIS — M25562 Pain in left knee: Secondary | ICD-10-CM | POA: Diagnosis not present

## 2015-08-06 DIAGNOSIS — F319 Bipolar disorder, unspecified: Secondary | ICD-10-CM | POA: Diagnosis not present

## 2015-08-06 DIAGNOSIS — M5412 Radiculopathy, cervical region: Secondary | ICD-10-CM | POA: Diagnosis not present

## 2015-08-06 DIAGNOSIS — Z720 Tobacco use: Secondary | ICD-10-CM | POA: Diagnosis not present

## 2015-08-06 DIAGNOSIS — F419 Anxiety disorder, unspecified: Secondary | ICD-10-CM | POA: Diagnosis not present

## 2015-08-07 ENCOUNTER — Telehealth: Payer: Self-pay | Admitting: Cardiology

## 2015-08-07 NOTE — Telephone Encounter (Signed)
Received records from [facility] for appointment on 08/16/15 with Dr Ellyn Hack.  Records given to [facility] (medical records) for Dr Allison Quarry schedule on 08/16/15.

## 2015-08-08 DIAGNOSIS — T8484XA Pain due to internal orthopedic prosthetic devices, implants and grafts, initial encounter: Secondary | ICD-10-CM | POA: Diagnosis not present

## 2015-08-10 DIAGNOSIS — F319 Bipolar disorder, unspecified: Secondary | ICD-10-CM | POA: Diagnosis not present

## 2015-08-13 DIAGNOSIS — F319 Bipolar disorder, unspecified: Secondary | ICD-10-CM | POA: Diagnosis not present

## 2015-08-15 DIAGNOSIS — F319 Bipolar disorder, unspecified: Secondary | ICD-10-CM | POA: Diagnosis not present

## 2015-08-16 ENCOUNTER — Encounter: Payer: Self-pay | Admitting: Cardiology

## 2015-08-16 ENCOUNTER — Ambulatory Visit (INDEPENDENT_AMBULATORY_CARE_PROVIDER_SITE_OTHER): Payer: 59 | Admitting: Cardiology

## 2015-08-16 VITALS — BP 130/90 | HR 100 | Ht 72.0 in | Wt 227.6 lb

## 2015-08-16 DIAGNOSIS — Z01810 Encounter for preprocedural cardiovascular examination: Secondary | ICD-10-CM | POA: Diagnosis not present

## 2015-08-16 DIAGNOSIS — I209 Angina pectoris, unspecified: Secondary | ICD-10-CM

## 2015-08-16 DIAGNOSIS — S82402S Unspecified fracture of shaft of left fibula, sequela: Secondary | ICD-10-CM

## 2015-08-16 DIAGNOSIS — F1410 Cocaine abuse, uncomplicated: Secondary | ICD-10-CM

## 2015-08-16 DIAGNOSIS — Z8249 Family history of ischemic heart disease and other diseases of the circulatory system: Secondary | ICD-10-CM

## 2015-08-16 DIAGNOSIS — E785 Hyperlipidemia, unspecified: Secondary | ICD-10-CM | POA: Diagnosis not present

## 2015-08-16 DIAGNOSIS — Z720 Tobacco use: Secondary | ICD-10-CM

## 2015-08-16 DIAGNOSIS — R5383 Other fatigue: Secondary | ICD-10-CM | POA: Diagnosis not present

## 2015-08-16 DIAGNOSIS — D689 Coagulation defect, unspecified: Secondary | ICD-10-CM | POA: Diagnosis not present

## 2015-08-16 DIAGNOSIS — F17209 Nicotine dependence, unspecified, with unspecified nicotine-induced disorders: Secondary | ICD-10-CM

## 2015-08-16 DIAGNOSIS — S82202S Unspecified fracture of shaft of left tibia, sequela: Secondary | ICD-10-CM

## 2015-08-16 DIAGNOSIS — S8292XS Unspecified fracture of left lower leg, sequela: Secondary | ICD-10-CM

## 2015-08-16 MED ORDER — METOPROLOL TARTRATE 25 MG PO TABS: 25.0000 mg | ORAL_TABLET | Freq: Two times a day (BID) | ORAL | Status: DC

## 2015-08-16 MED ORDER — ASPIRIN EC 81 MG PO TBEC: 81.0000 mg | DELAYED_RELEASE_TABLET | Freq: Every day | ORAL | Status: DC

## 2015-08-16 NOTE — Patient Instructions (Signed)
Dr Ellyn Hack has recommended making the following medication changes: 1. Metoprolol Tartrate (Lopressor) 25 mg - take 1 tablet by mouth twice daily 2. Aspirin 81 mg - take 1 tablet daily  Your physician has requested that you have a left cardiac catheterization on Friday, 3/31, with Dr Ellyn Hack. Cardiac catheterization is used to diagnose and/or treat various heart conditions. Doctors may recommend this procedure for a number of different reasons. The most common reason is to evaluate chest pain. Chest pain can be a symptom of coronary artery disease (CAD), and cardiac catheterization can show whether plaque is narrowing or blocking your heart's arteries. This procedure is also used to evaluate the valves, as well as measure the blood flow and oxygen levels in different parts of your heart. For further information please visit [email or web address].   Following your catheterization, you will not be allowed to drive for 3 days. No lifting, pushing, or pulling greater that 10 pounds is allowed for 1 week.  You will be required to have the following tests prior to the procedure:  1. Blood work-the blood work can be done no more than 7 days prior to the procedure. It can be done at any [facility] lab. There is one downstairs on the first floor of this building and one in the [facility] building [id number]).  2. Chest Xray-the chest xray order has already been placed at the [place].

## 2015-08-17 DIAGNOSIS — F319 Bipolar disorder, unspecified: Secondary | ICD-10-CM | POA: Diagnosis not present

## 2015-08-18 ENCOUNTER — Encounter: Payer: Self-pay | Admitting: Cardiology

## 2015-08-18 DIAGNOSIS — I209 Angina pectoris, unspecified: Secondary | ICD-10-CM | POA: Insufficient documentation

## 2015-08-18 DIAGNOSIS — F17209 Nicotine dependence, unspecified, with unspecified nicotine-induced disorders: Secondary | ICD-10-CM | POA: Insufficient documentation

## 2015-08-18 DIAGNOSIS — Z8249 Family history of ischemic heart disease and other diseases of the circulatory system: Secondary | ICD-10-CM

## 2015-08-18 DIAGNOSIS — E785 Hyperlipidemia, unspecified: Secondary | ICD-10-CM | POA: Insufficient documentation

## 2015-08-18 HISTORY — DX: Angina pectoris, unspecified: I20.9

## 2015-08-18 HISTORY — DX: Nicotine dependence, unspecified, with unspecified nicotine-induced disorders: F17.209

## 2015-08-18 HISTORY — DX: Family history of ischemic heart disease and other diseases of the circulatory system: Z82.49

## 2015-08-18 NOTE — Assessment & Plan Note (Signed)
He may have to have some type of revision surgery of his fractured and tibia. My recommendation would be to use Synergy DES stent if needed for PCI. This would require postponing surgery versus bridging with Aggrastat.   I will use Plavix

## 2015-08-18 NOTE — Assessment & Plan Note (Signed)
Currently on pravastatin. Monitored by PCP. Depending on what we find on cardiac catheterization, we may need to change his goal LDL to 70 from 100.

## 2015-08-18 NOTE — Assessment & Plan Note (Signed)
He has combination of family history with his father having multiple MIs in the 34s as well as his mother with some type of heart disease. He also had a grandparent  and a brother with sudden cardiac death. In addition to family history he also was a current smoker with dyslipidemia. He does not have history of diabetes.

## 2015-08-18 NOTE — Assessment & Plan Note (Signed)
Continued symptoms despite negative stress test. He is on Ranexa. Continue Ranexa, start low-dose beta blocker and 25 mg twice a day as well as baby aspirin.  Schedule for left heart catheterization with possible PCI - Friday, March 31  Performing MD:  Leonie Man, M.D., M.S.  Procedure:  Left Heart Catheterization with Coronary Angiography and Possible Percutaneous Coronary Intervention  The procedure with Risks/Benefits/Alternatives and Indications was reviewed with the patient and daughter.  All questions were answered.    Risks / Complications include, but not limited to: Death, MI, CVA/TIA, VF/VT (with defibrillation), Bradycardia (need for temporary pacer placement), contrast induced nephropathy, bleeding / bruising / hematoma / pseudoaneurysm, vascular or coronary injury (with possible emergent CT or Vascular Surgery), adverse medication reactions, infection.  Additional risks involving the use of radiation with the possibility of radiation burns and cancer were explained in detail.  The patient and his daughter voice understanding and agree to proceed.

## 2015-08-18 NOTE — Progress Notes (Signed)
PATIENT: [name] MRN: [id number] DOB: 1965/09/25 PCP: Isaias Cowman, PA-C  Clinic Note: Chief Complaint  Patient presents with  . New Evaluation    Interventional Cardiology Consultation  . Chest Pain    HPI: [name] is a 50 y.o. male with a PMH below who presents today for Initial cardiology evaluation in referral from [facility].  Cardiac risk factors include: Significant family history of premature coronary disease long-term cigarette smoking, hyperlipidemia (high LDL, low HDL).  He was initially seen last fall by Jefferson Fuel, PA at [facility] chest pain symptoms. He was evaluated with an echocardiogram followed by nuclear stress test (studies reviewed and entered in the past medical history). He subsequently is followed back up at [place] this February noting her minutes as a left anterior chest pain radiating to left-sided chest and down his left arm. Described as a squeezing sensation. Symptoms began roughly early January. He was started on nitroglycerin and then seen back 2 weeks later. He continued to note having the symptoms at that time, he has been started on Ranexa and referred for Interventional Cardiology Consultation.  Interval History: [name] presents here today on referral by [name], continues to have complaints of left-sided chest pain. He is a hard time distinguishing some of his chest pain from his baseline chronic pain. He says that the initial onset of symptoms were really back in October. Symptoms were then a little bit better but then recurred back in January. He notes that for the most part these symptoms are associated with exertion and get worse with exertion that they begin at rest. The only times that they occur at rest or when he is very tired or stressed out.  At this point, he says that he can have the symptoms that will come on maybe walking 30 L or more. It has been somewhat alleviated with Ranexa, but not  fully gone. It usually does not go down the left arm less he pushes it beyond where should.  The symptoms are alleviated when he takes nitroglycerin sublingual, and is rarely had a take a second dose. He describes the pain again is a relative sudden onset squeezing pressure-like sensation in the left side of his chest that radiates to his left arm. In addition to be exacerbated by activity that seemed to be exacerbated by taking a deep breath. He does not note significant coughing or wheezing or other antecedent injuries. For the most part the symptoms are alleviated by rest or "calming down".  The remainder of cardiac review of systems is as follows: Cardiovascular ROS: positive for - chest pain and dyspnea on exertion negative for - edema, irregular heartbeat, loss of consciousness, murmur, orthopnea, palpitations, paroxysmal nocturnal dyspnea, rapid heart rate or TIA/amaurosis fugax, syncope/near syncope :  He notes increasing weight of roughly 15 pounds over the last month or so.  Past Medical History  Diagnosis Date  . Anxiety   . Bipolar affective disorder ([place])   . Chronic back pain   . DDD (degenerative disc disease)   . DJD (degenerative joint disease)   . GERD (gastroesophageal reflux disease)   . Head injury   . Hyperlipidemia   . Hypothyroidism   . IBS (irritable bowel syndrome)   . Tibial plateau fracture, left   . Inguinal hernia     left  . Osteopenia   . Myofascial pain syndrome   . Arthritis   . Depression   . Fibromyalgia   .  Elevated liver function tests   . Sleep apnea     Prior Cardiac Evaluation and Past Surgical History: Past Surgical History  Procedure Laterality Date  . Cervical disc surgery      C5-7  . Septoplasty    . Colonoscopy    . Spinal cord stimulator implant    . Orif tibia plateau Left 10/20/2013    Procedure: OPEN REDUCTION INTERNAL FIXATION (ORIF) LEFT TIBIAL PLATEAU;  Surgeon: Rozanna Box, MD;  Location: [place];  Service: Orthopedics;   Laterality: Left;  . Fasciotomy Left 10/20/2013    Procedure: LEFT ANTERIOR COMPARTMENT FACSCIOTOMY;  Surgeon: Rozanna Box, MD;  Location: [place];  Service: Orthopedics;  Laterality: Left;  . Inguinal hernia repair Left   . Esophagogastroduodenoscopy    . Colonoscopy    . Carotid dopplers Bilateral 02/20/2015    [facility]: Mild, less than 39% left and right internal carotid artery stenosis. No significant plaque burden  . Transthoracic echocardiogram  02/20/2015    [facility]: Mild concentric LVH. EF 55-60%. Normal regional wall motion. Mild to moderate TR with no significant pulmonary hypertension.  . Nuclear stress test  03/06/2015    [facility]: Normal EKG. Low normal EF (49%) normal regional wall motion. No evidence of ischemia or infarction.    Allergies  Allergen Reactions  . Zolpidem Tartrate Other (See Comments)    Hallucinations and sleep walks  . Demerol [Meperidine] Itching and Rash    Current Outpatient Prescriptions  Medication Sig Dispense Refill  . diphenoxylate-atropine (LOMOTIL) 2.5-0.025 MG per tablet Take 1 tablet by mouth 4 (four) times daily as needed for diarrhea or loose stools. 60 tablet 0  . esomeprazole (NEXIUM) 40 MG capsule Take 40 mg by mouth at bedtime.    Marland Kitchen FLUoxetine (PROZAC) 40 MG capsule Take 40 mg by mouth daily.    . hydrOXYzine (VISTARIL) 100 MG capsule Take 100 mg by mouth at bedtime.    . hyoscyamine (LEVBID) 0.375 MG 12 hr tablet Take 1 tablet (0.375 mg total) by mouth every 12 (twelve) hours as needed for cramping. 60 tablet 11  . Iloperidone (FANAPT) 12 MG TABS Take 1 tablet by mouth daily.    Marland Kitchen levothyroxine (SYNTHROID, LEVOTHROID) 75 MCG tablet Take 75 mcg by mouth every evening.    . nitroGLYCERIN (NITROSTAT) 0.4 MG SL tablet Place 1 tablet (0.4 mg total) under the tongue every 5 (five) minutes as needed for chest pain. 20 tablet 0  . pravastatin (PRAVACHOL) 40 MG tablet Take 40 mg by mouth at bedtime.      . promethazine (PHENERGAN) 25 MG tablet Take 1 tablet (25 mg total) by mouth every 6 (six) hours as needed for nausea or vomiting. 30 tablet 0  . ranolazine (RANEXA) 1000 MG SR tablet Take 1,000 mg by mouth 2 (two) times daily.    Marland Kitchen aspirin EC 81 MG tablet Take 1 tablet (81 mg total) by mouth daily. 30 tablet 11  . metoprolol tartrate (LOPRESSOR) 25 MG tablet Take 1 tablet (25 mg total) by mouth 2 (two) times daily. 60 tablet 11   No current facility-administered medications for this visit.    Social History   Social History Narrative   He is a separated father of 1. His daughter who is in nursing school lives with him part-time.   He is currently disabled due to chronic back pain.    He did some college education.   He currently is trying to use electronic  cigarettes but has smoked one pack a day for over 35 years.   He has recently stopped using prescription benzodiazepine and pain medications because he is in the process of changing pain med doctors.   He does not routinely exercise secondary to back, neck pain as well as dyspnea.    family history includes Diabetes in his brother, father, and paternal grandmother; Heart attack (age of onset: 51) in his father; Heart disease in his mother; Heart failure in his father; Hyperlipidemia in his father; Hypertension in his brother, father, and mother; Kidney disease in his brother and father; Lung cancer in his maternal grandmother; Sudden death in his brother, mother, and paternal grandmother; Throat cancer in his brother. There is no history of Colon cancer.  ROS: A comprehensive Review of Systems - Was performed.  Pertinent positives noted above Review of Systems  Constitutional: Positive for malaise/fatigue (Just lack of desire to exercise.). Negative for fever, chills and weight loss.  HENT: Negative for nosebleeds.   Eyes: Negative for blurred vision.  Respiratory: Positive for cough (Daily cough. Nonproductive).   Cardiovascular:  Positive for chest pain. Negative for palpitations, orthopnea, claudication, leg swelling and PND.  Gastrointestinal: Negative for blood in stool and melena.  Genitourinary: Negative for dysuria and hematuria.  Musculoskeletal: Positive for back pain, joint pain and neck pain. Negative for myalgias and falls.       Chronic pain. Back, neck, knees.  Neurological: Positive for dizziness (Occasional orthostatic dizziness). Negative for speech change, focal weakness, seizures, loss of consciousness and headaches.  Endo/Heme/Allergies: Does not bruise/bleed easily.  Psychiatric/Behavioral: Positive for depression (He has clear depression symptoms - perhaps simply dysthymia.). The patient is nervous/anxious and has insomnia (Has a very hard time sleeping for much the night. This usually will trigger worst days of chest pain).   All other systems reviewed and are negative.   PHYSICAL EXAM BP 130/90 mmHg  Pulse 100  Ht 6' (1.829 m)  Wt 227 lb 9.6 oz (103.239 kg)  BMI 30.86 kg/m2 General appearance: alert, cooperative, appears older than stated age, no distress and Well-nourished somewhat disheveled appearing HEENT: [place]/AT, EOMI, MMM, anicteric sclera Neck: no adenopathy, no carotid bruit, no JVD, supple, symmetrical, trachea midline and Normal carotid upstroke. Lungs: Diffuse mild adventitial sounds. No active wheezing except with deep inspiration and expiration. No rales or rhonchi. Normal work of breathing. Heart: regular rate and rhythm, S1, S2 normal, no murmur, click, rub or gallop and normal apical impulse Abdomen: soft, non-tender; bowel sounds normal; no masses,  no organomegaly Extremities: extremities normal, atraumatic, no cyanosis or edema Pulses: 2+ and symmetric Skin: Skin color, texture, turgor normal. No rashes or lesions Neurologic: Mental status: Alert, oriented, thought content appropriate Cranial nerves: normal   Adult ECG Report  Rate: 97 ;  Rhythm: normal sinus rhythm;  cannot truly interpret secondary to his TENS unit still being on.  QRS Axis: 29;  PR Interval: 132 ;  QRS Duration: 100 ; QTc: 462  Voltages: Normal  Narrative Interpretation: Cannot interpret secondary to artifact  Recent Labs: Not currently available  ASSESSMENT / PLAN: Patient with multiple cardiac risk factors with persistent symptoms of exertional chest pain. He has had a stress test done within the last 6 months that was read as negative for ischemia. However despite this he continues to have exertional chest pain alleviated by nitroglycerin and Ranexa. He is being referred for evaluation for possible cardiac catheterization. At this point I think definitive evaluation with coronary angiography is  warranted for was likely class III angina. He will need precath laboratory studies and chest x-ray.  Problem List Items Addressed This Visit    Tobacco use disorder, continuous (Chronic)    Ready to quit: No Counseling given: Yes ~4-5 min. Smoking cessation instruction/counseling given:  counseled patient on the dangers of tobacco use, advised patient to stop smoking, and reviewed strategies to maximize success.  He is currently trying to use e-Cigarettes, but is still smoking real cigarettes.      Fracture of tibia with fibula, left, closed (Chronic)    He may have to have some type of revision surgery of his fractured and tibia. My recommendation would be to use Synergy DES stent if needed for PCI. This would require postponing surgery versus bridging with Aggrastat.   I will use Plavix      Family history of premature coronary artery disease (Chronic)    He has combination of family history with his father having multiple MIs in the 25s as well as his mother with some type of heart disease. He also had a grandparent  and a brother with sudden cardiac death. In addition to family history he also was a current smoker with dyslipidemia. He does not have history of diabetes.       Dyslipidemia, goal LDL below 70 (Chronic)    Currently on pravastatin. Monitored by PCP. Depending on what we find on cardiac catheterization, we may need to change his goal LDL to 70 from 100.      Relevant Medications   ranolazine (RANEXA) 1000 MG SR tablet   metoprolol tartrate (LOPRESSOR) 25 MG tablet   aspirin EC 81 MG tablet   Cocaine substance abuse    He says he is not used since 2015. No other UDS available.      Angina, class III ([place]) - Primary    Continued symptoms despite negative stress test. He is on Ranexa. Continue Ranexa, start low-dose beta blocker and 25 mg twice a day as well as baby aspirin.  Schedule for left heart catheterization with possible PCI - Friday, March 31  Performing MD:  Leonie Man, M.D., M.S.  Procedure:  Left Heart Catheterization with Coronary Angiography and Possible Percutaneous Coronary Intervention  The procedure with Risks/Benefits/Alternatives and Indications was reviewed with the patient and daughter.  All questions were answered.    Risks / Complications include, but not limited to: Death, MI, CVA/TIA, VF/VT (with defibrillation), Bradycardia (need for temporary pacer placement), contrast induced nephropathy, bleeding / bruising / hematoma / pseudoaneurysm, vascular or coronary injury (with possible emergent CT or Vascular Surgery), adverse medication reactions, infection.  Additional risks involving the use of radiation with the possibility of radiation burns and cancer were explained in detail.  The patient and his daughter voice understanding and agree to proceed.         Relevant Medications   ranolazine (RANEXA) 1000 MG SR tablet   metoprolol tartrate (LOPRESSOR) 25 MG tablet   aspirin EC 81 MG tablet   Other Relevant Orders   EKG 12-Lead (Completed)   CBC   Basic metabolic panel   Protime-INR   APTT   TSH   DG Chest 2 View   LEFT HEART CATHETERIZATION WITH CORONARY/GRAFT ANGIOGRAM    Other Visit Diagnoses     Pre-procedural cardiovascular examination        Relevant Orders    CBC    Basic metabolic panel    Protime-INR    APTT    TSH  DG Chest 2 View    Blood clotting disorder (HCC)        Relevant Orders    Protime-INR    APTT    Other fatigue        Relevant Orders    Basic metabolic panel    TSH       Meds ordered this encounter  Medications  . ranolazine (RANEXA) 1000 MG SR tablet    Sig: Take 1,000 mg by mouth 2 (two) times daily.  . metoprolol tartrate (LOPRESSOR) 25 MG tablet    Sig: Take 1 tablet (25 mg total) by mouth 2 (two) times daily.    Dispense:  60 tablet    Refill:  11  . aspirin EC 81 MG tablet    Sig: Take 1 tablet (81 mg total) by mouth daily.    Dispense:  30 tablet    Refill:  11     Followup: 1-2 months (post-cath)    Ivana Nicastro, Leonie Green, M.D., M.S. Interventional Cardiologist   Pager # [phone] Phone # [phone] [address] [place], [place] [address]

## 2015-08-18 NOTE — Assessment & Plan Note (Deleted)
He may have to have some type of revision surgery of his fractured and tibia. My recommendation would be to use Synergy DES stent if needed for PCI. This would require postponing surgery versus bridging with Aggrastat.   I will use Plavix

## 2015-08-18 NOTE — Assessment & Plan Note (Signed)
He says he is not used since 2015. No other UDS available.

## 2015-08-18 NOTE — Assessment & Plan Note (Signed)
Had previously been on oxycodone and Valium for back pain. Not on either one. He seems to be somewhat fidgety today. He is in the process of establishing a new pain M.D.

## 2015-08-18 NOTE — Assessment & Plan Note (Addendum)
Ready to quit: No Counseling given: Yes ~4-5 min. Smoking cessation instruction/counseling given:  counseled patient on the dangers of tobacco use, advised patient to stop smoking, and reviewed strategies to maximize success.  He is currently trying to use e-Cigarettes, but is still smoking real cigarettes.

## 2015-08-20 DIAGNOSIS — F319 Bipolar disorder, unspecified: Secondary | ICD-10-CM | POA: Diagnosis not present

## 2015-08-21 ENCOUNTER — Other Ambulatory Visit: Payer: Self-pay

## 2015-08-21 DIAGNOSIS — M25562 Pain in left knee: Secondary | ICD-10-CM | POA: Diagnosis not present

## 2015-08-21 DIAGNOSIS — I209 Angina pectoris, unspecified: Secondary | ICD-10-CM

## 2015-08-22 DIAGNOSIS — F319 Bipolar disorder, unspecified: Secondary | ICD-10-CM | POA: Diagnosis not present

## 2015-08-24 ENCOUNTER — Observation Stay (HOSPITAL_COMMUNITY)
Admission: EM | Admit: 2015-08-24 | Discharge: 2015-08-25 | Disposition: A | Discharge status: Home / Self Care | Payer: 59 | Attending: Internal Medicine | Admitting: Internal Medicine

## 2015-08-24 ENCOUNTER — Emergency Department (HOSPITAL_COMMUNITY): Payer: 59

## 2015-08-24 ENCOUNTER — Encounter (HOSPITAL_COMMUNITY): Payer: Self-pay | Admitting: Emergency Medicine

## 2015-08-24 DIAGNOSIS — M8580 Other specified disorders of bone density and structure, unspecified site: Secondary | ICD-10-CM | POA: Diagnosis not present

## 2015-08-24 DIAGNOSIS — E785 Hyperlipidemia, unspecified: Secondary | ICD-10-CM | POA: Diagnosis not present

## 2015-08-24 DIAGNOSIS — Z7982 Long term (current) use of aspirin: Secondary | ICD-10-CM | POA: Insufficient documentation

## 2015-08-24 DIAGNOSIS — R0602 Shortness of breath: Secondary | ICD-10-CM | POA: Diagnosis not present

## 2015-08-24 DIAGNOSIS — Z8781 Personal history of (healed) traumatic fracture: Secondary | ICD-10-CM | POA: Insufficient documentation

## 2015-08-24 DIAGNOSIS — F17210 Nicotine dependence, cigarettes, uncomplicated: Secondary | ICD-10-CM | POA: Diagnosis not present

## 2015-08-24 DIAGNOSIS — F329 Major depressive disorder, single episode, unspecified: Secondary | ICD-10-CM | POA: Diagnosis not present

## 2015-08-24 DIAGNOSIS — Z79899 Other long term (current) drug therapy: Secondary | ICD-10-CM | POA: Insufficient documentation

## 2015-08-24 DIAGNOSIS — R079 Chest pain, unspecified: Secondary | ICD-10-CM | POA: Diagnosis not present

## 2015-08-24 DIAGNOSIS — K219 Gastro-esophageal reflux disease without esophagitis: Secondary | ICD-10-CM | POA: Insufficient documentation

## 2015-08-24 DIAGNOSIS — K4090 Unilateral inguinal hernia, without obstruction or gangrene, not specified as recurrent: Secondary | ICD-10-CM | POA: Diagnosis not present

## 2015-08-24 DIAGNOSIS — F419 Anxiety disorder, unspecified: Secondary | ICD-10-CM | POA: Insufficient documentation

## 2015-08-24 DIAGNOSIS — M1990 Unspecified osteoarthritis, unspecified site: Secondary | ICD-10-CM | POA: Insufficient documentation

## 2015-08-24 DIAGNOSIS — E039 Hypothyroidism, unspecified: Secondary | ICD-10-CM | POA: Insufficient documentation

## 2015-08-24 DIAGNOSIS — M791 Myalgia: Secondary | ICD-10-CM | POA: Insufficient documentation

## 2015-08-24 DIAGNOSIS — K589 Irritable bowel syndrome without diarrhea: Secondary | ICD-10-CM | POA: Insufficient documentation

## 2015-08-24 DIAGNOSIS — G8929 Other chronic pain: Secondary | ICD-10-CM | POA: Insufficient documentation

## 2015-08-24 DIAGNOSIS — G4730 Sleep apnea, unspecified: Secondary | ICD-10-CM | POA: Insufficient documentation

## 2015-08-24 DIAGNOSIS — Z87828 Personal history of other (healed) physical injury and trauma: Secondary | ICD-10-CM | POA: Diagnosis not present

## 2015-08-24 DIAGNOSIS — F319 Bipolar disorder, unspecified: Secondary | ICD-10-CM | POA: Diagnosis not present

## 2015-08-24 LAB — BASIC METABOLIC PANEL
Anion gap: 12 (ref 5–15)
BUN: 13 mg/dL (ref 6–20)
CALCIUM: 9.2 mg/dL (ref 8.9–10.3)
CO2: 22 mmol/L (ref 22–32)
CREATININE: 1.3 mg/dL — AB (ref 0.61–1.24)
Chloride: 105 mmol/L (ref 101–111)
Glucose, Bld: 108 mg/dL — ABNORMAL HIGH (ref 65–99)
Potassium: 3.5 mmol/L (ref 3.5–5.1)
SODIUM: 139 mmol/L (ref 135–145)

## 2015-08-24 LAB — I-STAT TROPONIN, ED: Troponin i, poc: 0.01 ng/mL (ref 0.00–0.08)

## 2015-08-24 LAB — CBC
HEMATOCRIT: 40.1 % (ref 39.0–52.0)
Hemoglobin: 14.1 g/dL (ref 13.0–17.0)
MCH: 29.7 pg (ref 26.0–34.0)
MCHC: 35.2 g/dL (ref 30.0–36.0)
MCV: 84.6 fL (ref 78.0–100.0)
PLATELETS: 278 10*3/uL (ref 150–400)
RBC: 4.74 MIL/uL (ref 4.22–5.81)
RDW: 11.8 % (ref 11.5–15.5)
WBC: 10.1 10*3/uL (ref 4.0–10.5)

## 2015-08-24 MED ORDER — SODIUM CHLORIDE 0.9 % IV BOLUS (SEPSIS)
500.0000 mL | Freq: Once | INTRAVENOUS | Status: AC
  Administered 2015-08-24: 500 mL via INTRAVENOUS

## 2015-08-24 NOTE — ED Provider Notes (Signed)
CSN: [id number]     Arrival date & time 08/24/15  2107 History   First MD Initiated Contact with Patient 08/24/15 2146     Chief Complaint  Patient presents with  . Chest Pain     (Consider location/radiation/quality/duration/timing/severity/associated sxs/prior Treatment) HPI Comments: Patient with history of HTN, HLD, anxiety, chronic back pain (with spinal cord stimulator), GERD, IBS, presents with complaint of chest pain that started 3 hours prior to arrival while at rest. He describes the pain as "squeezing", involving right and left chest and radiating into the left arm. He feels SOB. He reports he took NTG at home without relief so presented to the emergency department. He has been followed by primary care for chest pain since last year with recent referral to cardiology. He has been seen by Dr. Ellyn Hack and is scheduled for a cardiac catheterization for 08/31/15, per patient.   Patient is a 50 y.o. male presenting with chest pain. The history is provided by the patient. No language interpreter was used.  Chest Pain Associated symptoms: shortness of breath   Associated symptoms: no abdominal pain and no fever     Past Medical History  Diagnosis Date  . Anxiety   . Bipolar affective disorder ([place])   . Chronic back pain   . DDD (degenerative disc disease)   . DJD (degenerative joint disease)   . GERD (gastroesophageal reflux disease)   . Head injury   . Hyperlipidemia   . Hypothyroidism   . IBS (irritable bowel syndrome)   . Tibial plateau fracture, left   . Inguinal hernia     left  . Osteopenia   . Myofascial pain syndrome   . Arthritis   . Depression   . Fibromyalgia   . Elevated liver function tests   . Sleep apnea    Past Surgical History  Procedure Laterality Date  . Cervical disc surgery      C5-7  . Septoplasty    . Colonoscopy    . Spinal cord stimulator implant    . Orif tibia plateau Left 10/20/2013    Procedure: OPEN REDUCTION INTERNAL FIXATION (ORIF)  LEFT TIBIAL PLATEAU;  Surgeon: Rozanna Box, MD;  Location: [place];  Service: Orthopedics;  Laterality: Left;  . Fasciotomy Left 10/20/2013    Procedure: LEFT ANTERIOR COMPARTMENT FACSCIOTOMY;  Surgeon: Rozanna Box, MD;  Location: [place];  Service: Orthopedics;  Laterality: Left;  . Inguinal hernia repair Left   . Esophagogastroduodenoscopy    . Colonoscopy    . Carotid dopplers Bilateral 02/20/2015    [facility]: Mild, less than 39% left and right internal carotid artery stenosis. No significant plaque burden  . Transthoracic echocardiogram  02/20/2015    [facility]: Mild concentric LVH. EF 55-60%. Normal regional wall motion. Mild to moderate TR with no significant pulmonary hypertension.  . Nuclear stress test  03/06/2015    [facility]: Normal EKG. Low normal EF (49%) normal regional wall motion. No evidence of ischemia or infarction.   Family History  Problem Relation Age of Onset  . Heart disease Mother   . Diabetes    . Kidney disease    . Diabetes Brother   . Lung cancer Maternal Grandmother   . Diabetes Paternal Grandmother   . Kidney disease Brother   . Colon cancer Neg Hx   . Hypertension Mother   . Hypertension Brother   . Hyperlipidemia Father   . Heart attack Father 63    At  least 5 MIs. Had CABG.  . Heart failure Father   . Diabetes Father   . Hypertension Father   . Kidney disease Father   . Sudden death Mother     Presumably cardiac  . Sudden death Paternal Grandmother     Unclear etiology  . Sudden death Brother     Thought to be related to heart disease  . Throat cancer Brother    Social History  Substance Use Topics  . Smoking status: Current Some Day Smoker -- 1.00 packs/day for 33 years    Types: Cigarettes  . Smokeless tobacco: Former Systems developer    Quit date: 02/08/2015  . Alcohol Use: No    Review of Systems  Constitutional: Negative for fever and chills.  Respiratory: Positive for shortness of breath.    Cardiovascular: Positive for chest pain.  Gastrointestinal: Negative.  Negative for abdominal pain.  Musculoskeletal: Negative.   Skin: Negative.   Neurological: Negative.       Allergies  Zolpidem tartrate and Demerol  Home Medications   Prior to Admission medications   Medication Sig Start Date End Date Taking? Authorizing Provider  aspirin EC 81 MG tablet Take 1 tablet (81 mg total) by mouth daily. 08/16/15  Yes Leonie Man, MD  esomeprazole (NEXIUM) 40 MG capsule Take 40 mg by mouth at bedtime.   Yes Historical Provider, MD  FLUoxetine (PROZAC) 40 MG capsule Take 40 mg by mouth daily.   Yes Historical Provider, MD  hydrOXYzine (VISTARIL) 100 MG capsule Take 100 mg by mouth at bedtime. Reported on 08/24/2015   Yes Historical Provider, MD  hyoscyamine (LEVBID) 0.375 MG 12 hr tablet Take 1 tablet (0.375 mg total) by mouth every 12 (twelve) hours as needed for cramping. 03/08/15  Yes Gatha Mayer, MD  Iloperidone (FANAPT) 12 MG TABS Take 1 tablet by mouth daily.   Yes Historical Provider, MD  levothyroxine (SYNTHROID, LEVOTHROID) 75 MCG tablet Take 75 mcg by mouth every evening.   Yes Historical Provider, MD  metoprolol tartrate (LOPRESSOR) 25 MG tablet Take 1 tablet (25 mg total) by mouth 2 (two) times daily. 08/16/15  Yes Leonie Man, MD  nitroGLYCERIN (NITROSTAT) 0.4 MG SL tablet Place 1 tablet (0.4 mg total) under the tongue every 5 (five) minutes as needed for chest pain. 12/26/12  Yes Adeline Saralyn Pilar, MD  pravastatin (PRAVACHOL) 40 MG tablet Take 40 mg by mouth at bedtime.    Yes Historical Provider, MD  ranolazine (RANEXA) 1000 MG SR tablet Take 1,000 mg by mouth 2 (two) times daily.   Yes Historical Provider, MD  diphenoxylate-atropine (LOMOTIL) 2.5-0.025 MG per tablet Take 1 tablet by mouth 4 (four) times daily as needed for diarrhea or loose stools. Patient not taking: Reported on 08/24/2015 11/28/14   Gatha Mayer, MD  promethazine (PHENERGAN) 25 MG tablet Take 1  tablet (25 mg total) by mouth every 6 (six) hours as needed for nausea or vomiting. Patient not taking: Reported on 08/24/2015 11/28/14   Gatha Mayer, MD   BP 105/70 mmHg  Pulse 81  Temp(Src) 97.7 F (36.5 C) (Oral)  Resp 16  SpO2 99% Physical Exam  Constitutional: He is oriented to person, place, and time. He appears well-developed and well-nourished.  HENT:  Head: Normocephalic.  Neck: Normal range of motion. Neck supple.  Cardiovascular: Normal rate and regular rhythm.   No murmur heard. Pulmonary/Chest: Effort normal and breath sounds normal. He has no wheezes. He has no rales.  Abdominal: Soft.  Bowel sounds are normal. There is no tenderness. There is no rebound and no guarding.  Musculoskeletal: Normal range of motion. He exhibits no edema.  Neurological: He is alert and oriented to person, place, and time.  Skin: Skin is warm and dry. No rash noted.  Psychiatric: He has a normal mood and affect.    ED Course  Procedures (including critical care time) Labs Review Labs Reviewed  BASIC METABOLIC PANEL - Abnormal; Notable for the following:    Glucose, Bld 108 (*)    Creatinine, Ser 1.30 (*)    All other components within normal limits  CBC  I-STAT TROPOININ, ED    Imaging Review Dg Chest 2 View  08/24/2015  CLINICAL DATA:  50 year old male with chest pain EXAM: CHEST  2 VIEW COMPARISON:  Chest radiograph dated 10/20/2013 FINDINGS: Two views of the chest demonstrate clear lungs. There is no focal consolidation, pleural effusion or pneumothorax. The cardiac silhouette is within normal limits. A spinal stimulator noted over the mid thoracic spine. Cervical fusion plate and screws seen. No acute osseous pathology. IMPRESSION: No active cardiopulmonary disease. Electronically Signed   By: Anner Crete M.D.   On: 08/24/2015 21:58   I have personally reviewed and evaluated these images and lab results as part of my medical decision-making.   EKG  Interpretation   Date/Time:  Friday August 24 2015 21:29:09 EDT Ventricular Rate:  54 PR Interval:  174 QRS Duration: 115 QT Interval:  439 QTC Calculation: 416 R Axis:   26 Text Interpretation:  Age not entered, assumed to be  50 years old for  purpose of ECG interpretation Sinus rhythm Nonspecific intraventricular  conduction delay No significant change since last tracing Confirmed by  Gerald Leitz ([id number]) on 08/24/2015 9:44:10 PM      MDM   Final diagnoses:  None    1. Chest pain  The patient is found to be bradycardic to the 50's and hypotensive to 93/69. No further NTG provided. Blood pressure improves with small bolus. Pain has been constant since arrival, some better now without intervention. EKG showing only sinus brady.  Cardiology consulted. Discussed with cardiology fellow who advises the patient needs to be admitted and is appropriate to admit to medicine for serial enzymes. They are available for consultation.  Discussed with Dr. Alcario Drought who accepts for admission.    Charlann Lange, PA-C 08/25/15 0113  Courteney Julio Alm, MD 08/27/15 [id number]

## 2015-08-24 NOTE — ED Notes (Signed)
Per pt, states "ive had chest pain off and on for a few years, been seeing a cardiologist for it and i have a cath scheduled next week". Pt states "ive never had a heart attack". Pt states episode started 2.5 hours PTA, took 2 nitro with no relief. Pt visibly pale and SOB.

## 2015-08-25 DIAGNOSIS — M1990 Unspecified osteoarthritis, unspecified site: Secondary | ICD-10-CM | POA: Diagnosis not present

## 2015-08-25 DIAGNOSIS — E039 Hypothyroidism, unspecified: Secondary | ICD-10-CM | POA: Diagnosis not present

## 2015-08-25 DIAGNOSIS — E785 Hyperlipidemia, unspecified: Secondary | ICD-10-CM | POA: Diagnosis not present

## 2015-08-25 DIAGNOSIS — K589 Irritable bowel syndrome without diarrhea: Secondary | ICD-10-CM | POA: Diagnosis not present

## 2015-08-25 DIAGNOSIS — G8929 Other chronic pain: Secondary | ICD-10-CM | POA: Diagnosis not present

## 2015-08-25 DIAGNOSIS — K4090 Unilateral inguinal hernia, without obstruction or gangrene, not specified as recurrent: Secondary | ICD-10-CM | POA: Diagnosis not present

## 2015-08-25 DIAGNOSIS — R079 Chest pain, unspecified: Secondary | ICD-10-CM

## 2015-08-25 DIAGNOSIS — K219 Gastro-esophageal reflux disease without esophagitis: Secondary | ICD-10-CM | POA: Diagnosis not present

## 2015-08-25 DIAGNOSIS — F419 Anxiety disorder, unspecified: Secondary | ICD-10-CM | POA: Diagnosis not present

## 2015-08-25 LAB — D-DIMER, QUANTITATIVE (NOT AT ARMC)

## 2015-08-25 LAB — TROPONIN I: Troponin I: <0.03 ng/mL (ref <0.031)

## 2015-08-25 MED ORDER — PRAVASTATIN SODIUM 40 MG PO TABS
40.0000 mg | ORAL_TABLET | Freq: Every day | ORAL | Status: DC
  Administered 2015-08-25: 40 mg via ORAL
  Filled 2015-08-25: qty 1

## 2015-08-25 MED ORDER — RANOLAZINE ER 500 MG PO TB12
1000.0000 mg | ORAL_TABLET | Freq: Two times a day (BID) | ORAL | Status: DC
  Administered 2015-08-25: 1000 mg via ORAL
  Filled 2015-08-25 (×4): qty 2

## 2015-08-25 MED ORDER — BUSPIRONE HCL 5 MG PO TABS: 5.0000 mg | ORAL_TABLET | Freq: Once | ORAL | Status: DC

## 2015-08-25 MED ORDER — LEVOTHYROXINE SODIUM 75 MCG PO TABS: 75.0000 ug | ORAL_TABLET | Freq: Every evening | ORAL | Status: DC

## 2015-08-25 MED ORDER — ENOXAPARIN SODIUM 40 MG/0.4ML ~~LOC~~ SOLN
40.0000 mg | SUBCUTANEOUS | Status: DC
  Administered 2015-08-25: 40 mg via SUBCUTANEOUS
  Filled 2015-08-25: qty 0.4

## 2015-08-25 MED ORDER — KETOROLAC TROMETHAMINE 30 MG/ML IJ SOLN
30.0000 mg | Freq: Once | INTRAMUSCULAR | Status: AC | PRN
  Administered 2015-08-25: 30 mg via INTRAVENOUS
  Filled 2015-08-25: qty 1

## 2015-08-25 MED ORDER — METOPROLOL TARTRATE 25 MG PO TABS: 25.0000 mg | ORAL_TABLET | Freq: Two times a day (BID) | ORAL | Status: DC

## 2015-08-25 MED ORDER — ASPIRIN EC 81 MG PO TBEC: 81.0000 mg | DELAYED_RELEASE_TABLET | Freq: Every day | ORAL | Status: DC

## 2015-08-25 MED ORDER — GI COCKTAIL ~~LOC~~
30.0000 mL | Freq: Once | ORAL | Status: AC
  Administered 2015-08-25: 30 mL via ORAL
  Filled 2015-08-25: qty 30

## 2015-08-25 MED ORDER — ONDANSETRON HCL 4 MG/2ML IJ SOLN: 4.0000 mg | Freq: Four times a day (QID) | INTRAMUSCULAR | Status: DC | PRN

## 2015-08-25 MED ORDER — ILOPERIDONE 12 MG PO TABS
1.0000 | ORAL_TABLET | Freq: Every day | ORAL | Status: DC
  Filled 2015-08-25: qty 1

## 2015-08-25 MED ORDER — ACETAMINOPHEN 325 MG PO TABS
650.0000 mg | ORAL_TABLET | ORAL | Status: DC | PRN
Start: 2015-08-25 — End: 2015-08-25
  Administered 2015-08-25: 650 mg via ORAL
  Filled 2015-08-25: qty 2

## 2015-08-25 MED ORDER — PANTOPRAZOLE SODIUM 40 MG PO TBEC
80.0000 mg | DELAYED_RELEASE_TABLET | Freq: Every day | ORAL | Status: DC
  Filled 2015-08-25: qty 2

## 2015-08-25 MED ORDER — HYDROXYZINE HCL 50 MG PO TABS
100.0000 mg | ORAL_TABLET | Freq: Every day | ORAL | Status: DC
  Administered 2015-08-25: 100 mg via ORAL
  Filled 2015-08-25: qty 2

## 2015-08-25 MED ORDER — MELOXICAM 7.5 MG PO TABS: 7.5000 mg | ORAL_TABLET | Freq: Every day | ORAL | Status: DC

## 2015-08-25 MED ORDER — FLUOXETINE HCL 20 MG PO CAPS
40.0000 mg | ORAL_CAPSULE | Freq: Every day | ORAL | Status: DC
  Filled 2015-08-25: qty 2

## 2015-08-25 NOTE — Consult Note (Signed)
Cardiology Consultation Note    Patient ID: [name], MRN: [id number], DOB/AGE: 12-24-1965 50 y.o. Admit date: 08/24/2015   Date of Consult: 08/25/2015 Primary Physician: Travis Cowman, PA-C Primary Cardiologist: Travis Hew, MD  Chief Complaint: Chest pain Reason for Consultation: Chest pain Requesting MD: Travis Kettle, MD  HPI: [name] is a 50 year old Boyd with history of chronic pain (including chest, abdomen, and back), hyperlipidemia, hypothyroidism, irritable bowel syndrome, anxiety, and bipolar disorder, whom we have been asked to see due to chest pain.  The patient reports several years of intermittent chest pain that led to outpatient cardiology consultation by [name] on 08/17/15.  The patient reportedly underwent myocardial perfusion stress testing and transthoracic echocardiography by his PCP within the last 6 months, which which were unremarkable.  However, the patient has several risk factors, including tobacco abuse, hyperlipidemia, and family history of early coronary disease, which led [name] to recommend cardiac catheterization in case the normal stress test represents a false negative study.  The catheterization has been scheduled for this coming Friday (08/31/15).  Earlier this evening, approximately 6:30 PM, the patient developed a bandlike tightness across his chest with accompanying shortness of breath, which he describes as "unable to take a deep breath."  He knows that this is distinctly different from the chest pain that he has felt in the past, which is typically more left sided and sharp in character with radiation to the left arm.  He took 2 sublingual nitroglycerin tablets after onset of his pain earlier today without any improvement.  He therefore presented to the emergency department for further evaluation.  He notes some improvement in his chest tightness and dyspnea with saline infusion, but continues to complain of discomfort.  He reports being  compliant with his home medications, including metoprolol and ranolazine.  The latter was started approximately one month ago and has improved the frequency and intensity of his chest pain episodes.  Past Medical History  Diagnosis Date  . Anxiety   . Bipolar affective disorder ([place])   . Chronic back pain   . DDD (degenerative disc disease)   . DJD (degenerative joint disease)   . GERD (gastroesophageal reflux disease)   . Head injury   . Hyperlipidemia   . Hypothyroidism   . IBS (irritable bowel syndrome)   . Tibial plateau fracture, left   . Inguinal hernia     left  . Osteopenia   . Myofascial pain syndrome   . Arthritis   . Depression   . Fibromyalgia   . Elevated liver function tests   . Sleep apnea       Surgical History:  Past Surgical History  Procedure Laterality Date  . Cervical disc surgery      C5-7  . Septoplasty    . Colonoscopy    . Spinal cord stimulator implant    . Orif tibia plateau Left 10/20/2013    Procedure: OPEN REDUCTION INTERNAL FIXATION (ORIF) LEFT TIBIAL PLATEAU;  Surgeon: Travis Box, MD;  Location: [place];  Service: Orthopedics;  Laterality: Left;  . Fasciotomy Left 10/20/2013    Procedure: LEFT ANTERIOR COMPARTMENT FACSCIOTOMY;  Surgeon: Travis Box, MD;  Location: [place];  Service: Orthopedics;  Laterality: Left;  . Inguinal hernia repair Left   . Esophagogastroduodenoscopy    . Colonoscopy    . Carotid dopplers Bilateral 02/20/2015    [facility]: Mild, less than 39% left and right internal carotid artery stenosis. No significant plaque  burden  . Transthoracic echocardiogram  02/20/2015    [facility]: Mild concentric LVH. EF 55-60%. Normal regional wall motion. Mild to moderate TR with no significant pulmonary hypertension.  . Nuclear stress test  03/06/2015    [facility]: Normal EKG. Low normal EF (49%) normal regional wall motion. No evidence of ischemia or infarction.     Home Meds: Prior to  Admission medications   Medication Sig Start Date Lev Cervone Date Taking? Authorizing Provider  aspirin EC 81 MG tablet Take 1 tablet (81 mg total) by mouth daily. 08/16/15  Yes Travis Man, MD  esomeprazole (NEXIUM) 40 MG capsule Take 40 mg by mouth at bedtime.   Yes Historical Provider, MD  FLUoxetine (PROZAC) 40 MG capsule Take 40 mg by mouth daily.   Yes Historical Provider, MD  hydrOXYzine (VISTARIL) 100 MG capsule Take 100 mg by mouth at bedtime. Reported on 08/24/2015   Yes Historical Provider, MD  hyoscyamine (LEVBID) 0.375 MG 12 hr tablet Take 1 tablet (0.375 mg total) by mouth every 12 (twelve) hours as needed for cramping. 03/08/15  Yes Travis Mayer, MD  Iloperidone (FANAPT) 12 MG TABS Take 1 tablet by mouth daily.   Yes Historical Provider, MD  levothyroxine (SYNTHROID, LEVOTHROID) 75 MCG tablet Take 75 mcg by mouth every evening.   Yes Historical Provider, MD  metoprolol tartrate (LOPRESSOR) 25 MG tablet Take 1 tablet (25 mg total) by mouth 2 (two) times daily. 08/16/15  Yes Travis Man, MD  nitroGLYCERIN (NITROSTAT) 0.4 MG SL tablet Place 1 tablet (0.4 mg total) under the tongue every 5 (five) minutes as needed for chest pain. 12/26/12  Yes Travis Saralyn Pilar, MD  pravastatin (PRAVACHOL) 40 MG tablet Take 40 mg by mouth at bedtime.    Yes Historical Provider, MD  ranolazine (RANEXA) 1000 MG SR tablet Take 1,000 mg by mouth 2 (two) times daily.   Yes Historical Provider, MD    Inpatient Medications:  . aspirin EC  81 mg Oral Daily  . enoxaparin (LOVENOX) injection  40 mg Subcutaneous Q24H  . FLUoxetine  40 mg Oral Daily  . hydrOXYzine  100 mg Oral QHS  . Iloperidone  1 tablet Oral Daily  . levothyroxine  75 mcg Oral QPM  . metoprolol tartrate  25 mg Oral BID  . pantoprazole  80 mg Oral Q1200  . pravastatin  40 mg Oral QHS  . ranolazine  1,000 mg Oral BID      Allergies:  Allergies  Allergen Reactions  . Zolpidem Tartrate Other (See Comments)    Hallucinations and sleep walks   . Demerol [Meperidine] Itching and Rash    Social History   Social History  . Marital Status: Married    Spouse Name: N/A  . Number of Children: 1  . Years of Education: N/A   Occupational History  . disabled    Social History Main Topics  . Smoking status: Current Some Day Smoker -- 1.00 packs/day for 33 years    Types: Cigarettes  . Smokeless tobacco: Former Systems developer    Quit date: 02/08/2015  . Alcohol Use: No  . Drug Use: No; endorses remote cocaine use , last used more than one year ago   . Sexual Activity: Not on file   Other Topics Concern  . Not on file   Social History Narrative   He is a separated father of 1. His daughter who is in nursing school lives with him part-time.   He is  currently disabled due to chronic back pain.    He did some college education.   He currently is trying to use electronic cigarettes but has smoked one pack a day for over 35 years.   He has recently stopped using prescription benzodiazepine and pain medications because he is in the process of changing pain med doctors.   He does not routinely exercise secondary to back, neck pain as well as dyspnea.     Family History  Problem Relation Age of Onset  . Heart disease Mother   . Diabetes    . Kidney disease    . Diabetes Brother   . Lung cancer Maternal Grandmother   . Diabetes Paternal Grandmother   . Kidney disease Brother   . Colon cancer Neg Hx   . Hypertension Mother   . Hypertension Brother   . Hyperlipidemia Father   . Heart attack Father 6    At least 5 MIs. Had CABG.  . Heart failure Father   . Diabetes Father   . Hypertension Father   . Kidney disease Father   . Sudden death Mother     Presumably cardiac  . Sudden death Paternal Grandmother     Unclear etiology  . Sudden death Brother     Thought to be related to heart disease  . Throat cancer Brother      Review of Systems: Review of systems is notable for nonproductive cough and intermittent leg cramping, as  well as chronic palpitations described as skipped beats.  He also experienced an episode of dizziness (described as the room spinning) while going for chest x-ray earlier today.  Otherwise, a 12 system review of systems was negative except as noted in the history of present illness.  Labs: POC TnI 0.01  Lab Results  Component Value Date   WBC 10.1 08/24/2015   HGB 14.1 08/24/2015   HCT 40.1 08/24/2015   MCV 84.6 08/24/2015   PLT 278 08/24/2015    Recent Labs Lab 08/24/15 2135  NA 139  K 3.5  CL 105  CO2 22  BUN 13  CREATININE 1.30*  CALCIUM 9.2  GLUCOSE 108*   Lab Results  Component Value Date   CHOL 207* 12/26/2012   HDL 42 12/26/2012   LDLCALC 100* 12/26/2012   TRIG 327* 12/26/2012   Lab Results  Component Value Date   DDIMER 0.48 01/29/2011    Radiology/Studies:  Dg Chest 2 View  08/24/2015  CLINICAL DATA:  50 year old male with chest pain EXAM: CHEST  2 VIEW COMPARISON:  Chest radiograph dated 10/20/2013 FINDINGS: Two views of the chest demonstrate clear lungs. There is no focal consolidation, pleural effusion or pneumothorax. The cardiac silhouette is within normal limits. A spinal stimulator noted over the mid thoracic spine. Cervical fusion plate and screws seen. No acute osseous pathology. IMPRESSION: No active cardiopulmonary disease. Electronically Signed   By: Anner Crete M.D.   On: 08/24/2015 21:58    Wt Readings from Last 3 Encounters:  08/16/15 103.239 kg (227 lb 9.6 oz)  03/08/15 102.059 kg (225 lb)  02/19/15 101.969 kg (224 lb 12.8 oz)    EKG: Sinus bradycardia without significant abnormalities or changes from prior tracings  Physical Exam: Blood pressure 116/75, pulse 50, temperature 97.7 F (36.5 C), temperature source Oral, resp. rate 15, SpO2 98 %. There is no weight on file to calculate BMI. General: Well developed, well nourished, in no acute distress. Head: Normocephalic, atraumatic, sclera non-icteric, no xanthomas, nares are without  discharge.  Neck: Negative for carotid bruits. JVD not elevated. Lungs: Clear bilaterally to auscultation without wheezes, rales, or rhonchi. Breathing is unlabored. Heart: RRR with S1 S2. No murmurs, rubs, or gallops appreciated. Abdomen: Soft, non-tender, non-distended with normoactive bowel sounds. No hepatomegaly. No rebound/guarding. No obvious abdominal masses. Msk:  Strength and tone appear normal for age. Extremities: No clubbing or cyanosis. No edema.  Distal pedal pulses are 2+ and equal bilaterally. Neuro: Alert and oriented X 3. No facial asymmetry. No focal deficit. Moves all extremities spontaneously.  Fine tremor noted. Psych:  Appears slightly anxious but responds appropriately to all questions.     Assessment and Plan  Mr. Hammann is a 50 year old Boyd with history of multiple chronic pain complaints, hyperlipidemia, anxiety, and bipolar disorder, who presents for further evaluation of atypical chest pain.  Chest pain: The chest pain that he describes tonight is different in location and quality than what he has experienced in the past.  Its location, onset at rest, and lack of improvement with nitroglycerin, coupled with stable EKG, negative troponin 1, and reportedly normal echocardiogram and myocardial perfusion stress test within the last 6 months, are reassuring that this does not represent an acute coronary syndrome.  Nonetheless, the patient is multiple cardiac risk factors, including his history of tobacco use, hyperlipidemia, and family history of early coronary artery disease.  It is therefore reasonable to proceed with coronary angiography, as previously recommended by [name].  Agree with overnight observation with serial troponins (every 6 hours 3).  Continue current medications, including metoprolol and ranolazine.  Given the lack of response to nitroglycerin and the patient's borderline low blood pressure at times, I see no clear reason to add a long-acting  nitrate at this time.  If serial troponins are negative and there is no evidence of dynamic EKG changes, it would be reasonable to proceed with planned cardiac catheterization on Friday (08/31/15).  If any worrisome features developed during the observation period, his may need to be expedited while the patient remains in the hospital.  Will defer evaluation for noncardiac etiologies of the patient's chest pain to the primary service.  Signed, Nelva Bush MD 08/25/2015, 2:03 AM

## 2015-08-25 NOTE — Discharge Summary (Signed)
Physician Discharge Summary  ARSHAAN HOSTLER [id number] DOB: [birth year] DOA: 08/24/2015  PCP: Isaias Cowman, PA-C  Admit date: 08/24/2015 Discharge date: 08/25/2015   Recommendations for Outpatient Follow-up:  Follow up for outpatient cath  Discharge Diagnoses:  Active Problems:   Chest pain Bradycardia Hypothyroidism Myofascial pain syndrome  Discharge Condition: stable  Diet recommendation: heart healthy  Filed Weights   08/25/15 0835  Weight: 102.014 kg (224 lb 14.4 oz)    History of present illness:  49 y.o. male with h/o chronic back pain, HTN, HLD, anxiety. Patient presents to the ED with CP onset 3 hours PTA while at rest. Pain is "Squeezing" in quality, right and left chest with radiation to left arm. Took NTG without relief (just dropped bis BP), presents to ED.  Turns out patient has been having chest pain for quite some time, saw cardiology on the 13th of the month, had negative stress test 6 months ago, and actually has heart cath scheduled for the 31st  Hospital Course:  Seen by cardiology. Observed on telemetry. MI ruled out. Metoprolol held for heart rates in the 40s to 60s. Chest pain improved with GI cocktail and toradol. May follow up for cath later this week  Procedures:  none  Consultations:  cardiology  Discharge Exam: Filed Vitals:   08/25/15 0800 08/25/15 0835  BP: 116/70 104/67  Pulse: 53 46  Temp:  98.3 F (36.8 C)  Resp: 13 17    General: a and o Cardiovascular: rrr Respiratory: CTA  Discharge Instructions   Discharge Instructions    Activity as tolerated - No restrictions    Complete by:  As directed      Diet - low sodium heart healthy    Complete by:  As directed           Current Discharge Medication List    START taking these medications   Details  meloxicam (MOBIC) 7.5 MG tablet Take 1 tablet (7.5 mg total) by mouth daily. Qty: 14 tablet, Refills: 0      CONTINUE these medications which have NOT CHANGED   Details  aspirin EC 81 MG tablet Take 1 tablet (81 mg total) by mouth daily. Qty: 30 tablet, Refills: 11    esomeprazole (NEXIUM) 40 MG capsule Take 40 mg by mouth at bedtime.    FLUoxetine (PROZAC) 40 MG capsule Take 40 mg by mouth daily.    hydrOXYzine (VISTARIL) 100 MG capsule Take 100 mg by mouth at bedtime. Reported on 08/24/2015    hyoscyamine (LEVBID) 0.375 MG 12 hr tablet Take 1 tablet (0.375 mg total) by mouth every 12 (twelve) hours as needed for cramping. Qty: 60 tablet, Refills: 11    Iloperidone (FANAPT) 12 MG TABS Take 1 tablet by mouth daily.    levothyroxine (SYNTHROID, LEVOTHROID) 75 MCG tablet Take 75 mcg by mouth every evening.    nitroGLYCERIN (NITROSTAT) 0.4 MG SL tablet Place 1 tablet (0.4 mg total) under the tongue every 5 (five) minutes as needed for chest pain. Qty: 20 tablet, Refills: 0    pravastatin (PRAVACHOL) 40 MG tablet Take 40 mg by mouth at bedtime.     ranolazine (RANEXA) 1000 MG SR tablet Take 1,000 mg by mouth 2 (two) times daily.      STOP taking these medications     metoprolol tartrate (LOPRESSOR) 25 MG tablet        Allergies  Allergen Reactions  . Zolpidem Tartrate Other (See Comments)    Hallucinations and sleep walks  .  Demerol [Meperidine] Itching and Rash   Follow-up Information    Follow up with Leonie Man, MD.   Specialty:  Cardiology   Why:  as previously scheduled   Contact information:   [address] [place] [id number] [phone]        The results of significant diagnostics from this hospitalization (including imaging, microbiology, ancillary and laboratory) are listed below for reference.    Significant Diagnostic Studies: Dg Chest 2 View  08/24/2015  CLINICAL DATA:  50 year old male with chest pain EXAM: CHEST  2 VIEW COMPARISON:  Chest radiograph dated 10/20/2013 FINDINGS: Two views of the chest demonstrate clear lungs. There is no focal consolidation, pleural effusion or  pneumothorax. The cardiac silhouette is within normal limits. A spinal stimulator noted over the mid thoracic spine. Cervical fusion plate and screws seen. No acute osseous pathology. IMPRESSION: No active cardiopulmonary disease. Electronically Signed   By: Anner Crete M.D.   On: 08/24/2015 21:58    Microbiology: No results found for this or any previous visit (from the past 240 hour(s)).   Labs: Basic Metabolic Panel:  Recent Labs Lab 08/24/15 2135  NA 139  K 3.5  CL 105  CO2 22  GLUCOSE 108*  BUN 13  CREATININE 1.30*  CALCIUM 9.2   Liver Function Tests: No results for input(s): AST, ALT, ALKPHOS, BILITOT, PROT, ALBUMIN in the last 168 hours. No results for input(s): LIPASE, AMYLASE in the last 168 hours. No results for input(s): AMMONIA in the last 168 hours. CBC:  Recent Labs Lab 08/24/15 2135  WBC 10.1  HGB 14.1  HCT 40.1  MCV 84.6  PLT 278   Cardiac Enzymes:  Recent Labs Lab 08/25/15 0228 08/25/15 0404 08/25/15 0737  TROPONINI <0.03 <0.03 <0.03   BNP: BNP (last 3 results) No results for input(s): BNP in the last 8760 hours.  ProBNP (last 3 results) No results for input(s): PROBNP in the last 8760 hours.  CBG: No results for input(s): GLUCAP in the last 168 hours.     Signed:  Delfina Redwood MD [facility] 08/25/2015, 10:26 AM

## 2015-08-25 NOTE — H&P (Addendum)
[facility] History and Physical  ASAEL Boyd [id number] DOB: 05/30/1966 DOA: 08/24/2015  Referring physician: EDP PCP: Travis Cowman, PA-C   Chief Complaint: Chest pain   HPI: [name] is a 50 y.o. male with h/o chronic back pain, HTN, HLD, anxiety.  Patient presents to the ED with CP onset 3 hours PTA while at rest.  Pain is "Squeezing" in quality, right and left chest with radiation to left arm.  Took NTG without relief (just dropped bis BP), presents to ED.  Turns out patient has been having chest pain for quite some time, saw cardiology on the 13th of the month, had negative stress test 6 months ago, and actually has heart cath scheduled for the 31st.  Review of Systems: Systems reviewed.  As above, otherwise negative  Past Medical History  Diagnosis Date  . Anxiety   . Bipolar affective disorder ([place])   . Chronic back pain   . DDD (degenerative disc disease)   . DJD (degenerative joint disease)   . GERD (gastroesophageal reflux disease)   . Head injury   . Hyperlipidemia   . Hypothyroidism   . IBS (irritable bowel syndrome)   . Tibial plateau fracture, left   . Inguinal hernia     left  . Osteopenia   . Myofascial pain syndrome   . Arthritis   . Depression   . Fibromyalgia   . Elevated liver function tests   . Sleep apnea    Past Surgical History  Procedure Laterality Date  . Cervical disc surgery      C5-7  . Septoplasty    . Colonoscopy    . Spinal cord stimulator implant    . Orif tibia plateau Left 10/20/2013    Procedure: OPEN REDUCTION INTERNAL FIXATION (ORIF) LEFT TIBIAL PLATEAU;  Surgeon: Travis Box, MD;  Location: [facility];  Service: Orthopedics;  Laterality: Left;  . Fasciotomy Left 10/20/2013    Procedure: LEFT ANTERIOR COMPARTMENT FACSCIOTOMY;  Surgeon: Travis Box, MD;  Location: [facility];  Service: Orthopedics;  Laterality: Left;  . Inguinal hernia repair Left   . Esophagogastroduodenoscopy    . Colonoscopy    . Carotid  dopplers Bilateral 02/20/2015    [facility]: Mild, less than 39% left and right internal carotid artery stenosis. No significant plaque burden  . Transthoracic echocardiogram  02/20/2015    [facility]: Mild concentric LVH. EF 55-60%. Normal regional wall motion. Mild to moderate TR with no significant pulmonary hypertension.  . Nuclear stress test  03/06/2015    [facility]: Normal EKG. Low normal EF (49%) normal regional wall motion. No evidence of ischemia or infarction.   Social History:  reports that he has been smoking Cigarettes.  He has a 33 pack-year smoking history. He quit smokeless tobacco use about 6 months ago. He reports that he does not drink alcohol or use illicit drugs.  Allergies  Allergen Reactions  . Zolpidem Tartrate Other (See Comments)    Hallucinations and sleep walks  . Demerol [Meperidine] Itching and Rash    Family History  Problem Relation Age of Onset  . Heart disease Mother   . Diabetes    . Kidney disease    . Diabetes Brother   . Lung cancer Maternal Grandmother   . Diabetes Paternal Grandmother   . Kidney disease Brother   . Colon cancer Neg Hx   . Hypertension Mother   . Hypertension Brother   . Hyperlipidemia Father   .  Heart attack Father 2    At least 5 MIs. Had CABG.  . Heart failure Father   . Diabetes Father   . Hypertension Father   . Kidney disease Father   . Sudden death Mother     Presumably cardiac  . Sudden death Paternal Grandmother     Unclear etiology  . Sudden death Brother     Thought to be related to heart disease  . Throat cancer Brother      Prior to Admission medications   Medication Sig Start Date End Date Taking? Authorizing Provider  aspirin EC 81 MG tablet Take 1 tablet (81 mg total) by mouth daily. 08/16/15  Yes Leonie Man, MD  esomeprazole (NEXIUM) 40 MG capsule Take 40 mg by mouth at bedtime.   Yes Historical Provider, MD  FLUoxetine (PROZAC) 40 MG capsule Take 40 mg  by mouth daily.   Yes Historical Provider, MD  hydrOXYzine (VISTARIL) 100 MG capsule Take 100 mg by mouth at bedtime. Reported on 08/24/2015   Yes Historical Provider, MD  hyoscyamine (LEVBID) 0.375 MG 12 hr tablet Take 1 tablet (0.375 mg total) by mouth every 12 (twelve) hours as needed for cramping. 03/08/15  Yes Gatha Mayer, MD  Iloperidone (FANAPT) 12 MG TABS Take 1 tablet by mouth daily.   Yes Historical Provider, MD  levothyroxine (SYNTHROID, LEVOTHROID) 75 MCG tablet Take 75 mcg by mouth every evening.   Yes Historical Provider, MD  metoprolol tartrate (LOPRESSOR) 25 MG tablet Take 1 tablet (25 mg total) by mouth 2 (two) times daily. 08/16/15  Yes Leonie Man, MD  nitroGLYCERIN (NITROSTAT) 0.4 MG SL tablet Place 1 tablet (0.4 mg total) under the tongue every 5 (five) minutes as needed for chest pain. 12/26/12  Yes Adeline Saralyn Pilar, MD  pravastatin (PRAVACHOL) 40 MG tablet Take 40 mg by mouth at bedtime.    Yes Historical Provider, MD  ranolazine (RANEXA) 1000 MG SR tablet Take 1,000 mg by mouth 2 (two) times daily.   Yes Historical Provider, MD  diphenoxylate-atropine (LOMOTIL) 2.5-0.025 MG per tablet Take 1 tablet by mouth 4 (four) times daily as needed for diarrhea or loose stools. Patient not taking: Reported on 08/24/2015 11/28/14   Gatha Mayer, MD  promethazine (PHENERGAN) 25 MG tablet Take 1 tablet (25 mg total) by mouth every 6 (six) hours as needed for nausea or vomiting. Patient not taking: Reported on 08/24/2015 11/28/14   Gatha Mayer, MD   Physical Exam: Filed Vitals:   08/25/15 0045 08/25/15 0100  BP: 111/72 116/75  Pulse: 47 50  Temp:    Resp: 10 15    BP 116/75 mmHg  Pulse 50  Temp(Src) 97.7 F (36.5 C) (Oral)  Resp 15  SpO2 98%  General Appearance:    Alert, oriented, no distress, appears stated age  Head:    Normocephalic, atraumatic  Eyes:    PERRL, EOMI, sclera non-icteric        Nose:   Nares without drainage or epistaxis. Mucosa, turbinates normal   Throat:   Moist mucous membranes. Oropharynx without erythema or exudate.  Neck:   Supple. No carotid bruits.  No thyromegaly.  No lymphadenopathy.   Back:     No CVA tenderness, no spinal tenderness  Lungs:     Clear to auscultation bilaterally, without wheezes, rhonchi or rales  Chest wall:    No tenderness to palpitation  Heart:    Regular rate and rhythm without murmurs, gallops, rubs  Abdomen:  Soft, non-tender, nondistended, normal bowel sounds, no organomegaly  Genitalia:    deferred  Rectal:    deferred  Extremities:   No clubbing, cyanosis or edema.  Pulses:   2+ and symmetric all extremities  Skin:   Skin color, texture, turgor normal, no rashes or lesions  Lymph nodes:   Cervical, supraclavicular, and axillary nodes normal  Neurologic:   CNII-XII intact. Normal strength, sensation and reflexes      throughout    Labs on Admission:  Basic Metabolic Panel:  Recent Labs Lab 08/24/15 2135  NA 139  K 3.5  CL 105  CO2 22  GLUCOSE 108*  BUN 13  CREATININE 1.30*  CALCIUM 9.2   Liver Function Tests: No results for input(s): AST, ALT, ALKPHOS, BILITOT, PROT, ALBUMIN in the last 168 hours. No results for input(s): LIPASE, AMYLASE in the last 168 hours. No results for input(s): AMMONIA in the last 168 hours. CBC:  Recent Labs Lab 08/24/15 2135  WBC 10.1  HGB 14.1  HCT 40.1  MCV 84.6  PLT 278   Cardiac Enzymes: No results for input(s): CKTOTAL, CKMB, CKMBINDEX, TROPONINI in the last 168 hours.  BNP (last 3 results) No results for input(s): PROBNP in the last 8760 hours. CBG: No results for input(s): GLUCAP in the last 168 hours.  Radiological Exams on Admission: Dg Chest 2 View  08/24/2015  CLINICAL DATA:  50 year old male with chest pain EXAM: CHEST  2 VIEW COMPARISON:  Chest radiograph dated 10/20/2013 FINDINGS: Two views of the chest demonstrate clear lungs. There is no focal consolidation, pleural effusion or pneumothorax. The cardiac silhouette is  within normal limits. A spinal stimulator noted over the mid thoracic spine. Cervical fusion plate and screws seen. No acute osseous pathology. IMPRESSION: No active cardiopulmonary disease. Electronically Signed   By: Anner Crete M.D.   On: 08/24/2015 21:58    EKG: Independently reviewed.  Assessment/Plan Active Problems:   Chest pain   1. Chest pain - 1. Cardiology has seen at bedside. 2. For now plan tele obs 3. Serial trops 4. If trops negative, likely discharge tomorrow and follow up on 31st for scheduled heart cath. 5. Stress test 6 months ago was negative 6. Suspect there may be somewhat of a chronic pain component here as well. 7. Patient is okay to go to tele bed in my opinion. 8. NPO after midnight just in case 2. HTN- continue home meds 3. HLD - continue home meds  Cards has seen at bedside.  Code Status: Full  Family Communication: No family in room Disposition Plan: Admit to obs   Time spent: 50 min  GARDNER, JARED M. [facility] Pager [phone]  If 7AM-7PM, please contact the day team taking care of the patient [email or web address] Password [email or web address] 08/25/2015, 1:39 AM

## 2015-08-25 NOTE — ED Notes (Signed)
Spoke with Dr. Conley Canal, made aware pt having continued CP. Reports give GI cocktail and if no response can give IV 30 mg toradol. Please document response in chart. Pt will remain NPO.

## 2015-08-25 NOTE — ED Notes (Signed)
Patient ambulatory to restroom with steady gait. Denies increase in CP or SOB.

## 2015-08-27 DIAGNOSIS — F319 Bipolar disorder, unspecified: Secondary | ICD-10-CM | POA: Diagnosis not present

## 2015-08-29 DIAGNOSIS — F319 Bipolar disorder, unspecified: Secondary | ICD-10-CM | POA: Diagnosis not present

## 2015-08-30 ENCOUNTER — Ambulatory Visit
Admission: RE | Admit: 2015-08-30 | Discharge: 2015-08-30 | Disposition: A | Discharge status: Home / Self Care | Payer: 59 | Source: Ambulatory Visit | Attending: Cardiology | Admitting: Cardiology

## 2015-08-30 DIAGNOSIS — R5383 Other fatigue: Secondary | ICD-10-CM | POA: Diagnosis not present

## 2015-08-30 DIAGNOSIS — I209 Angina pectoris, unspecified: Secondary | ICD-10-CM | POA: Diagnosis not present

## 2015-08-30 DIAGNOSIS — Z01818 Encounter for other preprocedural examination: Secondary | ICD-10-CM | POA: Diagnosis not present

## 2015-08-30 DIAGNOSIS — D689 Coagulation defect, unspecified: Secondary | ICD-10-CM | POA: Diagnosis not present

## 2015-08-30 DIAGNOSIS — Z01810 Encounter for preprocedural cardiovascular examination: Secondary | ICD-10-CM

## 2015-08-31 ENCOUNTER — Encounter (HOSPITAL_COMMUNITY): Payer: Self-pay | Admitting: Cardiology

## 2015-08-31 ENCOUNTER — Encounter (HOSPITAL_COMMUNITY)
Admission: RE | Disposition: A | Discharge status: Home / Self Care | Payer: Self-pay | Source: Ambulatory Visit | Attending: Cardiology

## 2015-08-31 ENCOUNTER — Ambulatory Visit (HOSPITAL_COMMUNITY)
Admission: RE | Admit: 2015-08-31 | Discharge: 2015-08-31 | Disposition: A | Discharge status: Home / Self Care | Payer: 59 | Source: Ambulatory Visit | Attending: Cardiology | Admitting: Cardiology

## 2015-08-31 DIAGNOSIS — F319 Bipolar disorder, unspecified: Secondary | ICD-10-CM | POA: Diagnosis not present

## 2015-08-31 DIAGNOSIS — M549 Dorsalgia, unspecified: Secondary | ICD-10-CM | POA: Insufficient documentation

## 2015-08-31 DIAGNOSIS — M797 Fibromyalgia: Secondary | ICD-10-CM | POA: Insufficient documentation

## 2015-08-31 DIAGNOSIS — M1990 Unspecified osteoarthritis, unspecified site: Secondary | ICD-10-CM | POA: Diagnosis not present

## 2015-08-31 DIAGNOSIS — F419 Anxiety disorder, unspecified: Secondary | ICD-10-CM | POA: Diagnosis not present

## 2015-08-31 DIAGNOSIS — F1410 Cocaine abuse, uncomplicated: Secondary | ICD-10-CM | POA: Diagnosis not present

## 2015-08-31 DIAGNOSIS — M791 Myalgia: Secondary | ICD-10-CM | POA: Insufficient documentation

## 2015-08-31 DIAGNOSIS — Z8249 Family history of ischemic heart disease and other diseases of the circulatory system: Secondary | ICD-10-CM | POA: Diagnosis not present

## 2015-08-31 DIAGNOSIS — K219 Gastro-esophageal reflux disease without esophagitis: Secondary | ICD-10-CM | POA: Diagnosis not present

## 2015-08-31 DIAGNOSIS — Z7982 Long term (current) use of aspirin: Secondary | ICD-10-CM | POA: Diagnosis not present

## 2015-08-31 DIAGNOSIS — G8929 Other chronic pain: Secondary | ICD-10-CM | POA: Diagnosis not present

## 2015-08-31 DIAGNOSIS — G4730 Sleep apnea, unspecified: Secondary | ICD-10-CM | POA: Insufficient documentation

## 2015-08-31 DIAGNOSIS — E039 Hypothyroidism, unspecified: Secondary | ICD-10-CM | POA: Diagnosis not present

## 2015-08-31 DIAGNOSIS — K589 Irritable bowel syndrome without diarrhea: Secondary | ICD-10-CM | POA: Diagnosis not present

## 2015-08-31 DIAGNOSIS — F17210 Nicotine dependence, cigarettes, uncomplicated: Secondary | ICD-10-CM | POA: Insufficient documentation

## 2015-08-31 DIAGNOSIS — M8580 Other specified disorders of bone density and structure, unspecified site: Secondary | ICD-10-CM | POA: Insufficient documentation

## 2015-08-31 DIAGNOSIS — I209 Angina pectoris, unspecified: Secondary | ICD-10-CM | POA: Diagnosis present

## 2015-08-31 DIAGNOSIS — E785 Hyperlipidemia, unspecified: Secondary | ICD-10-CM | POA: Diagnosis not present

## 2015-08-31 DIAGNOSIS — I25119 Atherosclerotic heart disease of native coronary artery with unspecified angina pectoris: Secondary | ICD-10-CM | POA: Diagnosis not present

## 2015-08-31 DIAGNOSIS — F17209 Nicotine dependence, unspecified, with unspecified nicotine-induced disorders: Secondary | ICD-10-CM | POA: Diagnosis present

## 2015-08-31 HISTORY — PX: CARDIAC CATHETERIZATION: SHX172

## 2015-08-31 LAB — CBC
HCT: 45 % (ref 39.0–52.0)
Hemoglobin: 15.2 g/dL (ref 13.0–17.0)
MCH: 30.3 pg (ref 26.0–34.0)
MCHC: 33.8 g/dL (ref 30.0–36.0)
MCV: 89.8 fL (ref 78.0–100.0)
MPV: 10.2 fL (ref 8.6–12.4)
Platelets: 289 10*3/uL (ref 150–400)
RBC: 5.01 MIL/uL (ref 4.22–5.81)
RDW: 13.1 % (ref 11.5–15.5)
WBC: 9.5 10*3/uL (ref 4.0–10.5)

## 2015-08-31 LAB — BASIC METABOLIC PANEL
BUN: 17 mg/dL (ref 7–25)
CHLORIDE: 105 mmol/L (ref 98–110)
CO2: 23 mmol/L (ref 20–31)
CREATININE: 1.01 mg/dL (ref 0.60–1.35)
Calcium: 9.4 mg/dL (ref 8.6–10.3)
GLUCOSE: 90 mg/dL (ref 65–99)
Potassium: 4.4 mmol/L (ref 3.5–5.3)
SODIUM: 138 mmol/L (ref 135–146)

## 2015-08-31 LAB — PROTIME-INR
INR: 0.93 (ref <1.50)
Prothrombin Time: 12.6 seconds (ref 11.6–15.2)

## 2015-08-31 LAB — TSH: TSH: 2.13 m[IU]/L (ref 0.40–4.50)

## 2015-08-31 LAB — APTT: aPTT: 28 seconds (ref 24–37)

## 2015-08-31 SURGERY — LEFT HEART CATH AND CORONARY ANGIOGRAPHY

## 2015-08-31 MED ORDER — MIDAZOLAM HCL 2 MG/2ML IJ SOLN
INTRAMUSCULAR | Status: DC | PRN
  Administered 2015-08-31: 2 mg via INTRAVENOUS

## 2015-08-31 MED ORDER — SODIUM CHLORIDE 0.9 % WEIGHT BASED INFUSION
3.0000 mL/kg/h | INTRAVENOUS | Status: DC
  Administered 2015-08-31: 3 mL/kg/h via INTRAVENOUS

## 2015-08-31 MED ORDER — HEPARIN SODIUM (PORCINE) 1000 UNIT/ML IJ SOLN
INTRAMUSCULAR | Status: AC
  Filled 2015-08-31: qty 1

## 2015-08-31 MED ORDER — LIDOCAINE HCL (PF) 1 % IJ SOLN
INTRAMUSCULAR | Status: DC | PRN
  Administered 2015-08-31: 4 mL

## 2015-08-31 MED ORDER — HEPARIN SODIUM (PORCINE) 1000 UNIT/ML IJ SOLN
INTRAMUSCULAR | Status: DC | PRN
  Administered 2015-08-31: 5000 [IU] via INTRAVENOUS

## 2015-08-31 MED ORDER — VERAPAMIL HCL 2.5 MG/ML IV SOLN
INTRAVENOUS | Status: AC
  Filled 2015-08-31: qty 2

## 2015-08-31 MED ORDER — SODIUM CHLORIDE 0.9 % IV SOLN: 250.0000 mL | INTRAVENOUS | Status: DC | PRN

## 2015-08-31 MED ORDER — ONDANSETRON HCL 4 MG/2ML IJ SOLN: 4.0000 mg | Freq: Four times a day (QID) | INTRAMUSCULAR | Status: DC | PRN

## 2015-08-31 MED ORDER — IOPAMIDOL (ISOVUE-370) INJECTION 76%
INTRAVENOUS | Status: DC | PRN
  Administered 2015-08-31: 120 mL via INTRAVENOUS

## 2015-08-31 MED ORDER — HEPARIN (PORCINE) IN NACL 2-0.9 UNIT/ML-% IJ SOLN
INTRAMUSCULAR | Status: AC
  Filled 2015-08-31: qty 1000

## 2015-08-31 MED ORDER — HEPARIN (PORCINE) IN NACL 2-0.9 UNIT/ML-% IJ SOLN
INTRAMUSCULAR | Status: DC | PRN
  Administered 2015-08-31: 10 mL via INTRA_ARTERIAL

## 2015-08-31 MED ORDER — SODIUM CHLORIDE 0.9 % WEIGHT BASED INFUSION: 1.0000 mL/kg/h | INTRAVENOUS | Status: DC

## 2015-08-31 MED ORDER — MIDAZOLAM HCL 2 MG/2ML IJ SOLN
INTRAMUSCULAR | Status: AC
  Filled 2015-08-31: qty 2

## 2015-08-31 MED ORDER — IOPAMIDOL (ISOVUE-370) INJECTION 76%
INTRAVENOUS | Status: AC
  Filled 2015-08-31: qty 50

## 2015-08-31 MED ORDER — HEPARIN (PORCINE) IN NACL 2-0.9 UNIT/ML-% IJ SOLN
INTRAMUSCULAR | Status: DC | PRN
  Administered 2015-08-31: 1000 mL

## 2015-08-31 MED ORDER — NITROGLYCERIN 1 MG/10 ML FOR IR/CATH LAB
INTRA_ARTERIAL | Status: DC | PRN
  Administered 2015-08-31: 200 ug

## 2015-08-31 MED ORDER — FENTANYL CITRATE (PF) 100 MCG/2ML IJ SOLN
INTRAMUSCULAR | Status: AC
  Filled 2015-08-31: qty 2

## 2015-08-31 MED ORDER — ASPIRIN 81 MG PO CHEW
81.0000 mg | CHEWABLE_TABLET | ORAL | Status: AC
  Administered 2015-08-31: 81 mg via ORAL

## 2015-08-31 MED ORDER — SODIUM CHLORIDE 0.9% FLUSH: 3.0000 mL | INTRAVENOUS | Status: DC | PRN

## 2015-08-31 MED ORDER — ASPIRIN 81 MG PO CHEW
CHEWABLE_TABLET | ORAL | Status: AC
  Filled 2015-08-31: qty 1

## 2015-08-31 MED ORDER — ACETAMINOPHEN 325 MG PO TABS: 650.0000 mg | ORAL_TABLET | ORAL | Status: DC | PRN

## 2015-08-31 MED ORDER — LIDOCAINE HCL (PF) 1 % IJ SOLN
INTRAMUSCULAR | Status: AC
  Filled 2015-08-31: qty 30

## 2015-08-31 MED ORDER — IOPAMIDOL (ISOVUE-370) INJECTION 76%
INTRAVENOUS | Status: AC
  Filled 2015-08-31: qty 100

## 2015-08-31 MED ORDER — SODIUM CHLORIDE 0.9% FLUSH: 3.0000 mL | Freq: Two times a day (BID) | INTRAVENOUS | Status: DC

## 2015-08-31 MED ORDER — SODIUM CHLORIDE 0.9 % WEIGHT BASED INFUSION: 3.0000 mL/kg/h | INTRAVENOUS | Status: DC

## 2015-08-31 MED ORDER — MORPHINE SULFATE (PF) 2 MG/ML IV SOLN: 2.0000 mg | INTRAVENOUS | Status: DC | PRN

## 2015-08-31 MED ORDER — FENTANYL CITRATE (PF) 100 MCG/2ML IJ SOLN
INTRAMUSCULAR | Status: DC | PRN
  Administered 2015-08-31: 25 ug via INTRAVENOUS

## 2015-08-31 MED ORDER — NITROGLYCERIN 1 MG/10 ML FOR IR/CATH LAB
INTRA_ARTERIAL | Status: AC
  Filled 2015-08-31: qty 10

## 2015-08-31 SURGICAL SUPPLY — 13 items
CATH INFINITI 5 FR JL3.5 (CATHETERS) ×3 IMPLANT
CATH INFINITI 5FR ANG PIGTAIL (CATHETERS) ×3 IMPLANT
CATH INFINITI JR4 5F (CATHETERS) ×3 IMPLANT
CATH LAUNCHER 5F RADR (CATHETERS) ×1 IMPLANT
CATHETER LAUNCHER 5F RADR (CATHETERS) ×3
DEVICE RAD COMP TR BAND LRG (VASCULAR PRODUCTS) ×3 IMPLANT
GLIDESHEATH SLEND SS 6F .021 (SHEATH) ×3 IMPLANT
KIT HEART LEFT (KITS) ×3 IMPLANT
PACK CARDIAC CATHETERIZATION (CUSTOM PROCEDURE TRAY) ×3 IMPLANT
SYR MEDRAD MARK V 150ML (SYRINGE) ×3 IMPLANT
TRANSDUCER W/STOPCOCK (MISCELLANEOUS) ×3 IMPLANT
TUBING CIL FLEX 10 FLL-RA (TUBING) ×3 IMPLANT
WIRE SAFE-T 1.5MM-J .035X260CM (WIRE) ×3 IMPLANT

## 2015-08-31 NOTE — Discharge Instructions (Signed)
Radial Site Care °Refer to this sheet in the next few weeks. These instructions provide you with information about caring for yourself after your procedure. Your health care provider may also give you more specific instructions. Your treatment has been planned according to current medical practices, but problems sometimes occur. Call your health care provider if you have any problems or questions after your procedure. °WHAT TO EXPECT AFTER THE PROCEDURE °After your procedure, it is typical to have the following: °· Bruising at the radial site that usually fades within 1-2 weeks. °· Blood collecting in the tissue (hematoma) that may be painful to the touch. It should usually decrease in size and tenderness within 1-2 weeks. °HOME CARE INSTRUCTIONS °· Take medicines only as directed by your health care provider. °· You may shower 24-48 hours after the procedure or as directed by your health care provider. Remove the bandage (dressing) and gently wash the site with plain soap and water. Pat the area dry with a clean towel. Do not rub the site, because this may cause bleeding. °· Do not take baths, swim, or use a hot tub until your health care provider approves. °· Check your insertion site every day for redness, swelling, or drainage. °· Do not apply powder or lotion to the site. °· Do not flex or bend the affected arm for 24 hours or as directed by your health care provider. °· Do not push or pull heavy objects with the affected arm for 24 hours or as directed by your health care provider. °· Do not lift over 10 lb (4.5 kg) for 5 days after your procedure or as directed by your health care provider. °· Ask your health care provider when it is okay to: °¨ Return to work or school. °¨ Resume usual physical activities or sports. °¨ Resume sexual activity. °· Do not drive home if you are discharged the same day as the procedure. Have someone else drive you. °· You may drive 24 hours after the procedure unless otherwise  instructed by your health care provider. °· Do not operate machinery or power tools for 24 hours after the procedure. °· If your procedure was done as an outpatient procedure, which means that you went home the same day as your procedure, a responsible adult should be with you for the first 24 hours after you arrive home. °· Keep all follow-up visits as directed by your health care provider. This is important. °SEEK MEDICAL CARE IF: °· You have a fever. °· You have chills. °· You have increased bleeding from the radial site. Hold pressure on the site. °SEEK IMMEDIATE MEDICAL CARE IF: °· You have unusual pain at the radial site. °· You have redness, warmth, or swelling at the radial site. °· You have drainage (other than a small amount of blood on the dressing) from the radial site. °· The radial site is bleeding, and the bleeding does not stop after 30 minutes of holding steady pressure on the site. °· Your arm or hand becomes pale, cool, tingly, or numb. °  °This information is not intended to replace advice given to you by your health care provider. Make sure you discuss any questions you have with your health care provider. °  °Document Released: 06/21/2010 Document Revised: 06/09/2014 Document Reviewed: 12/05/2013 °[employer]vier Interactive Patient Education ©2016 [employer] ° °

## 2015-08-31 NOTE — H&P (View-Only) (Signed)
PATIENT: [name] MRN: [id number] DOB: Sep 25, 1965 PCP: Isaias Cowman, PA-C  Clinic Note: Chief Complaint  Patient presents with  . New Evaluation    Interventional Cardiology Consultation  . Chest Pain    HPI: [name] is a 50 y.o. male with a PMH below who presents today for Initial cardiology evaluation in referral from [facility].  Cardiac risk factors include: Significant family history of premature coronary disease long-term cigarette smoking, hyperlipidemia (high LDL, low HDL).  He was initially seen last fall by Jefferson Fuel, PA at [facility] chest pain symptoms. He was evaluated with an echocardiogram followed by nuclear stress test (studies reviewed and entered in the past medical history). He subsequently is followed back up at [place] this February noting her minutes as a left anterior chest pain radiating to left-sided chest and down his left arm. Described as a squeezing sensation. Symptoms began roughly early January. He was started on nitroglycerin and then seen back 2 weeks later. He continued to note having the symptoms at that time, he has been started on Ranexa and referred for Interventional Cardiology Consultation.  Interval History: [name] presents here today on referral by Mr. Richardson Chiquito, continues to have complaints of left-sided chest pain. He is a hard time distinguishing some of his chest pain from his baseline chronic pain. He says that the initial onset of symptoms were really back in October. Symptoms were then a little bit better but then recurred back in January. He notes that for the most part these symptoms are associated with exertion and get worse with exertion that they begin at rest. The only times that they occur at rest or when he is very tired or stressed out.  At this point, he says that he can have the symptoms that will come on maybe walking 30 L or more. It has been somewhat alleviated with Ranexa, but not  fully gone. It usually does not go down the left arm less he pushes it beyond where should.  The symptoms are alleviated when he takes nitroglycerin sublingual, and is rarely had a take a second dose. He describes the pain again is a relative sudden onset squeezing pressure-like sensation in the left side of his chest that radiates to his left arm. In addition to be exacerbated by activity that seemed to be exacerbated by taking a deep breath. He does not note significant coughing or wheezing or other antecedent injuries. For the most part the symptoms are alleviated by rest or "calming down".  The remainder of cardiac review of systems is as follows: Cardiovascular ROS: positive for - chest pain and dyspnea on exertion negative for - edema, irregular heartbeat, loss of consciousness, murmur, orthopnea, palpitations, paroxysmal nocturnal dyspnea, rapid heart rate or TIA/amaurosis fugax, syncope/near syncope :  He notes increasing weight of roughly 15 pounds over the last month or so.  Past Medical History  Diagnosis Date  . Anxiety   . Bipolar affective disorder ([place])   . Chronic back pain   . DDD (degenerative disc disease)   . DJD (degenerative joint disease)   . GERD (gastroesophageal reflux disease)   . Head injury   . Hyperlipidemia   . Hypothyroidism   . IBS (irritable bowel syndrome)   . Tibial plateau fracture, left   . Inguinal hernia     left  . Osteopenia   . Myofascial pain syndrome   . Arthritis   . Depression   . Fibromyalgia   .  Elevated liver function tests   . Sleep apnea     Prior Cardiac Evaluation and Past Surgical History: Past Surgical History  Procedure Laterality Date  . Cervical disc surgery      C5-7  . Septoplasty    . Colonoscopy    . Spinal cord stimulator implant    . Orif tibia plateau Left 10/20/2013    Procedure: OPEN REDUCTION INTERNAL FIXATION (ORIF) LEFT TIBIAL PLATEAU;  Surgeon: Rozanna Box, MD;  Location: [place];  Service: Orthopedics;   Laterality: Left;  . Fasciotomy Left 10/20/2013    Procedure: LEFT ANTERIOR COMPARTMENT FACSCIOTOMY;  Surgeon: Rozanna Box, MD;  Location: [place];  Service: Orthopedics;  Laterality: Left;  . Inguinal hernia repair Left   . Esophagogastroduodenoscopy    . Colonoscopy    . Carotid dopplers Bilateral 02/20/2015    [facility] Mild, less than 39% left and right internal carotid artery stenosis. No significant plaque burden  . Transthoracic echocardiogram  02/20/2015    [facility]: Mild concentric LVH. EF 55-60%. Normal regional wall motion. Mild to moderate TR with no significant pulmonary hypertension.  . Nuclear stress test  03/06/2015    [facility]: Normal EKG. Low normal EF (49%) normal regional wall motion. No evidence of ischemia or infarction.    Allergies  Allergen Reactions  . Zolpidem Tartrate Other (See Comments)    Hallucinations and sleep walks  . Demerol [Meperidine] Itching and Rash    Current Outpatient Prescriptions  Medication Sig Dispense Refill  . diphenoxylate-atropine (LOMOTIL) 2.5-0.025 MG per tablet Take 1 tablet by mouth 4 (four) times daily as needed for diarrhea or loose stools. 60 tablet 0  . esomeprazole (NEXIUM) 40 MG capsule Take 40 mg by mouth at bedtime.    Marland Kitchen FLUoxetine (PROZAC) 40 MG capsule Take 40 mg by mouth daily.    . hydrOXYzine (VISTARIL) 100 MG capsule Take 100 mg by mouth at bedtime.    . hyoscyamine (LEVBID) 0.375 MG 12 hr tablet Take 1 tablet (0.375 mg total) by mouth every 12 (twelve) hours as needed for cramping. 60 tablet 11  . Iloperidone (FANAPT) 12 MG TABS Take 1 tablet by mouth daily.    Marland Kitchen levothyroxine (SYNTHROID, LEVOTHROID) 75 MCG tablet Take 75 mcg by mouth every evening.    . nitroGLYCERIN (NITROSTAT) 0.4 MG SL tablet Place 1 tablet (0.4 mg total) under the tongue every 5 (five) minutes as needed for chest pain. 20 tablet 0  . pravastatin (PRAVACHOL) 40 MG tablet Take 40 mg by mouth at bedtime.      . promethazine (PHENERGAN) 25 MG tablet Take 1 tablet (25 mg total) by mouth every 6 (six) hours as needed for nausea or vomiting. 30 tablet 0  . ranolazine (RANEXA) 1000 MG SR tablet Take 1,000 mg by mouth 2 (two) times daily.    Marland Kitchen aspirin EC 81 MG tablet Take 1 tablet (81 mg total) by mouth daily. 30 tablet 11  . metoprolol tartrate (LOPRESSOR) 25 MG tablet Take 1 tablet (25 mg total) by mouth 2 (two) times daily. 60 tablet 11   No current facility-administered medications for this visit.    Social History   Social History Narrative   He is a separated father of 1. His daughter who is in nursing school lives with him part-time.   He is currently disabled due to chronic back pain.    He did some college education.   He currently is trying to use electronic  cigarettes but has smoked one pack a day for over 35 years.   He has recently stopped using prescription benzodiazepine and pain medications because he is in the process of changing pain med doctors.   He does not routinely exercise secondary to back, neck pain as well as dyspnea.    family history includes Diabetes in his brother, father, and paternal grandmother; Heart attack (age of onset: 52) in his father; Heart disease in his mother; Heart failure in his father; Hyperlipidemia in his father; Hypertension in his brother, father, and mother; Kidney disease in his brother and father; Lung cancer in his maternal grandmother; Sudden death in his brother, mother, and paternal grandmother; Throat cancer in his brother. There is no history of Colon cancer.  ROS: A comprehensive Review of Systems - Was performed.  Pertinent positives noted above Review of Systems  Constitutional: Positive for malaise/fatigue (Just lack of desire to exercise.). Negative for fever, chills and weight loss.  HENT: Negative for nosebleeds.   Eyes: Negative for blurred vision.  Respiratory: Positive for cough (Daily cough. Nonproductive).   Cardiovascular:  Positive for chest pain. Negative for palpitations, orthopnea, claudication, leg swelling and PND.  Gastrointestinal: Negative for blood in stool and melena.  Genitourinary: Negative for dysuria and hematuria.  Musculoskeletal: Positive for back pain, joint pain and neck pain. Negative for myalgias and falls.       Chronic pain. Back, neck, knees.  Neurological: Positive for dizziness (Occasional orthostatic dizziness). Negative for speech change, focal weakness, seizures, loss of consciousness and headaches.  Endo/Heme/Allergies: Does not bruise/bleed easily.  Psychiatric/Behavioral: Positive for depression (He has clear depression symptoms - perhaps simply dysthymia.). The patient is nervous/anxious and has insomnia (Has a very hard time sleeping for much the night. This usually will trigger worst days of chest pain).   All other systems reviewed and are negative.   PHYSICAL EXAM BP 130/90 mmHg  Pulse 100  Ht 6' (1.829 m)  Wt 227 lb 9.6 oz (103.239 kg)  BMI 30.86 kg/m2 General appearance: alert, cooperative, appears older than stated age, no distress and Well-nourished somewhat disheveled appearing HEENT: [place]/AT, EOMI, MMM, anicteric sclera Neck: no adenopathy, no carotid bruit, no JVD, supple, symmetrical, trachea midline and Normal carotid upstroke. Lungs: Diffuse mild adventitial sounds. No active wheezing except with deep inspiration and expiration. No rales or rhonchi. Normal work of breathing. Heart: regular rate and rhythm, S1, S2 normal, no murmur, click, rub or gallop and normal apical impulse Abdomen: soft, non-tender; bowel sounds normal; no masses,  no organomegaly Extremities: extremities normal, atraumatic, no cyanosis or edema Pulses: 2+ and symmetric Skin: Skin color, texture, turgor normal. No rashes or lesions Neurologic: Mental status: Alert, oriented, thought content appropriate Cranial nerves: normal   Adult ECG Report  Rate: 97 ;  Rhythm: normal sinus rhythm;  cannot truly interpret secondary to his TENS unit still being on.  QRS Axis: 29;  PR Interval: 132 ;  QRS Duration: 100 ; QTc: 462  Voltages: Normal  Narrative Interpretation: Cannot interpret secondary to artifact  Recent Labs: Not currently available  ASSESSMENT / PLAN: Patient with multiple cardiac risk factors with persistent symptoms of exertional chest pain. He has had a stress test done within the last 6 months that was read as negative for ischemia. However despite this he continues to have exertional chest pain alleviated by nitroglycerin and Ranexa. He is being referred for evaluation for possible cardiac catheterization. At this point I think definitive evaluation with coronary angiography is  warranted for was likely class III angina. He will need precath laboratory studies and chest x-ray.  Problem List Items Addressed This Visit    Tobacco use disorder, continuous (Chronic)    Ready to quit: No Counseling given: Yes ~4-5 min. Smoking cessation instruction/counseling given:  counseled patient on the dangers of tobacco use, advised patient to stop smoking, and reviewed strategies to maximize success.  He is currently trying to use e-Cigarettes, but is still smoking real cigarettes.      Fracture of tibia with fibula, left, closed (Chronic)    He may have to have some type of revision surgery of his fractured and tibia. My recommendation would be to use Synergy DES stent if needed for PCI. This would require postponing surgery versus bridging with Aggrastat.   I will use Plavix      Family history of premature coronary artery disease (Chronic)    He has combination of family history with his father having multiple MIs in the 52s as well as his mother with some type of heart disease. He also had a grandparent  and a brother with sudden cardiac death. In addition to family history he also was a current smoker with dyslipidemia. He does not have history of diabetes.       Dyslipidemia, goal LDL below 70 (Chronic)    Currently on pravastatin. Monitored by PCP. Depending on what we find on cardiac catheterization, we may need to change his goal LDL to 70 from 100.      Relevant Medications   ranolazine (RANEXA) 1000 MG SR tablet   metoprolol tartrate (LOPRESSOR) 25 MG tablet   aspirin EC 81 MG tablet   Cocaine substance abuse    He says he is not used since 2015. No other UDS available.      Angina, class III ([place]) - Primary    Continued symptoms despite negative stress test. He is on Ranexa. Continue Ranexa, start low-dose beta blocker and 25 mg twice a day as well as baby aspirin.  Schedule for left heart catheterization with possible PCI - Friday, March 31  Performing MD:  Leonie Man, M.D., M.S.  Procedure:  Left Heart Catheterization with Coronary Angiography and Possible Percutaneous Coronary Intervention  The procedure with Risks/Benefits/Alternatives and Indications was reviewed with the patient and daughter.  All questions were answered.    Risks / Complications include, but not limited to: Death, MI, CVA/TIA, VF/VT (with defibrillation), Bradycardia (need for temporary pacer placement), contrast induced nephropathy, bleeding / bruising / hematoma / pseudoaneurysm, vascular or coronary injury (with possible emergent CT or Vascular Surgery), adverse medication reactions, infection.  Additional risks involving the use of radiation with the possibility of radiation burns and cancer were explained in detail.  The patient and his daughter voice understanding and agree to proceed.         Relevant Medications   ranolazine (RANEXA) 1000 MG SR tablet   metoprolol tartrate (LOPRESSOR) 25 MG tablet   aspirin EC 81 MG tablet   Other Relevant Orders   EKG 12-Lead (Completed)   CBC   Basic metabolic panel   Protime-INR   APTT   TSH   DG Chest 2 View   LEFT HEART CATHETERIZATION WITH CORONARY/GRAFT ANGIOGRAM    Other Visit Diagnoses     Pre-procedural cardiovascular examination        Relevant Orders    CBC    Basic metabolic panel    Protime-INR    APTT    TSH  DG Chest 2 View    Blood clotting disorder (HCC)        Relevant Orders    Protime-INR    APTT    Other fatigue        Relevant Orders    Basic metabolic panel    TSH       Meds ordered this encounter  Medications  . ranolazine (RANEXA) 1000 MG SR tablet    Sig: Take 1,000 mg by mouth 2 (two) times daily.  . metoprolol tartrate (LOPRESSOR) 25 MG tablet    Sig: Take 1 tablet (25 mg total) by mouth 2 (two) times daily.    Dispense:  60 tablet    Refill:  11  . aspirin EC 81 MG tablet    Sig: Take 1 tablet (81 mg total) by mouth daily.    Dispense:  30 tablet    Refill:  11     Followup: 1-2 months (post-cath)    HARDING, Leonie Green, M.D., M.S. Interventional Cardiologist   Pager # [phone] Phone # [phone] [address] [place], [place] [address]

## 2015-08-31 NOTE — Research (Signed)
CADLAD Informed Consent   Subject Name: [name]  Subject met inclusion and exclusion criteria.  The informed consent form, study requirements and expectations were reviewed with the subject and questions and concerns were addressed prior to the signing of the consent form.  The subject verbalized understanding of the trail requirements.  The subject agreed to participate in the CADLAD trial and signed the informed consent.  The informed consent was obtained prior to performance of any protocol-specific procedures for the subject.  A copy of the signed informed consent was given to the subject and a copy was placed in the subject's medical record.  Jake Bathe Jr. 08/31/2015, [id number]

## 2015-08-31 NOTE — Interval H&P Note (Signed)
History and Physical Interval Note:  08/31/2015 7:18 AM  [name]  has presented today for surgery, with the diagnosis of angina, class III. He was admitted Friday 3/24 for severe CP - r/o MI.  D/c'd home with plan to return for scheduled Cath.   The various methods of treatment have been discussed with the patient and family. After consideration of risks, benefits and other options for treatment, the patient has consented to  Procedure(s): Left Heart Cath and Coronary Angiography (N/A) with possible percutaneous coronary intervention as a surgical intervention .  The patient's history has been reviewed, patient examined, no change in status, stable for surgery.  I have reviewed the patient's chart and labs.  Questions were answered to the patient's satisfaction.    Cath Lab Visit (complete for each Cath Lab visit)  Clinical Evaluation Leading to the Procedure:   ACS: No.  Non-ACS:    Anginal Classification: CCS III  Anti-ischemic medical therapy: Maximal Therapy (2 or more classes of medications)  Non-Invasive Test Results: Low-risk stress test findings: cardiac mortality <1%/year  Prior CABG: No previous CABG  AUC  Ischemic Symptoms? CCS III (Marked limitation of ordinary activity) Anti-ischemic Medical Therapy? Maximal Medical Therapy (2 or more classes of medications) Non-invasive Test Results? Low-risk stress test findings: cardiac mortality <1%/year Prior CABG? No Previous CABG   Patient Information:   1-2V CAD, no prox LAD  A (7)  Indication: 15; Score: 7   Patient Information:   CTO of 1 vessel, no other CAD  U (6)  Indication: 25; Score: 6   Patient Information:   1V CAD with prox LAD  A (8)  Indication: 31; Score: 8   Patient Information:   2V-CAD with prox LAD  A (8)  Indication: 37; Score: 8   Patient Information:   3V-CAD without LMCA  A (8)  Indication: 43; Score: 8   Patient Information:   3V-CAD without LMCA With Abnormal LV  systolic function  A (9)  Indication: 48; Score: 9   Patient Information:   LMCA-CAD  A (9)  Indication: 49; Score: 9   Patient Information:   2V-CAD with prox LAD PCI  A (7)  Indication: 62; Score: 7   Patient Information:   2V-CAD with prox LAD CABG  A (8)  Indication: 62; Score: 8   Patient Information:   3V-CAD without LMCA With Low CAD burden(i.e., 3 focal stenoses, low SYNTAX score) PCI  A (7)  Indication: 63; Score: 7   Patient Information:   3V-CAD without LMCA With Low CAD burden(i.e., 3 focal stenoses, low SYNTAX score) CABG  A (9)  Indication: 63; Score: 9   Patient Information:   3V-CAD without LMCA E06c - Intermediate-high CAD burden (i.e., multiple diffuse lesions, presence of CTO, or high SYNTAX score) PCI  U (4)  Indication: 64; Score: 4   Patient Information:   3V-CAD without LMCA E06c - Intermediate-high CAD burden (i.e., multiple diffuse lesions, presence of CTO, or high SYNTAX score) CABG  A (9)  Indication: 64; Score: 9   Patient Information:   LMCA-CAD With Isolated LMCA stenosis  PCI  U (6)  Indication: 65; Score: 6   Patient Information:   LMCA-CAD With Isolated LMCA stenosis  CABG  A (9)  Indication: 65; Score: 9   Patient Information:   LMCA-CAD Additional CAD, low CAD burden (i.e., 1- to 2-vessel additional involvement, low SYNTAX score) PCI  U (5)  Indication: 66; Score: 5   Patient Information:  LMCA-CAD Additional CAD, low CAD burden (i.e., 1- to 2-vessel additional involvement, low SYNTAX score) CABG  A (9)  Indication: 66; Score: 9   Patient Information:   LMCA-CAD Additional CAD, intermediate-high CAD burden (i.e., 3-vessel involvement, presence of CTO, or high SYNTAX score) PCI  I (3)  Indication: 67; Score: 3   Patient Information:   LMCA-CAD Additional CAD, intermediate-high CAD burden (i.e., 3-vessel involvement, presence of CTO, or high SYNTAX score) CABG  A (9)   Indication: 67; Score: 9  HARDING, DAVID W

## 2015-09-03 DIAGNOSIS — F419 Anxiety disorder, unspecified: Secondary | ICD-10-CM | POA: Diagnosis not present

## 2015-09-03 DIAGNOSIS — Z720 Tobacco use: Secondary | ICD-10-CM | POA: Diagnosis not present

## 2015-09-03 DIAGNOSIS — F1120 Opioid dependence, uncomplicated: Secondary | ICD-10-CM | POA: Diagnosis not present

## 2015-09-03 DIAGNOSIS — M5412 Radiculopathy, cervical region: Secondary | ICD-10-CM | POA: Diagnosis not present

## 2015-09-03 DIAGNOSIS — M545 Low back pain: Secondary | ICD-10-CM | POA: Diagnosis not present

## 2015-09-03 DIAGNOSIS — Q761 Klippel-Feil syndrome: Secondary | ICD-10-CM | POA: Diagnosis not present

## 2015-09-03 DIAGNOSIS — G894 Chronic pain syndrome: Secondary | ICD-10-CM | POA: Diagnosis not present

## 2015-09-03 DIAGNOSIS — M25562 Pain in left knee: Secondary | ICD-10-CM | POA: Diagnosis not present

## 2015-09-13 DIAGNOSIS — R079 Chest pain, unspecified: Secondary | ICD-10-CM | POA: Diagnosis not present

## 2015-09-18 DIAGNOSIS — F3132 Bipolar disorder, current episode depressed, moderate: Secondary | ICD-10-CM | POA: Diagnosis not present

## 2015-09-18 DIAGNOSIS — F411 Generalized anxiety disorder: Secondary | ICD-10-CM | POA: Diagnosis not present

## 2015-10-01 DIAGNOSIS — G894 Chronic pain syndrome: Secondary | ICD-10-CM | POA: Diagnosis not present

## 2015-10-01 DIAGNOSIS — F1120 Opioid dependence, uncomplicated: Secondary | ICD-10-CM | POA: Diagnosis not present

## 2015-10-01 DIAGNOSIS — M545 Low back pain: Secondary | ICD-10-CM | POA: Diagnosis not present

## 2015-10-01 DIAGNOSIS — Q761 Klippel-Feil syndrome: Secondary | ICD-10-CM | POA: Diagnosis not present

## 2015-10-01 DIAGNOSIS — F419 Anxiety disorder, unspecified: Secondary | ICD-10-CM | POA: Diagnosis not present

## 2015-10-01 DIAGNOSIS — Z720 Tobacco use: Secondary | ICD-10-CM | POA: Diagnosis not present

## 2015-10-01 DIAGNOSIS — M25562 Pain in left knee: Secondary | ICD-10-CM | POA: Diagnosis not present

## 2015-10-01 DIAGNOSIS — M5412 Radiculopathy, cervical region: Secondary | ICD-10-CM | POA: Diagnosis not present

## 2015-10-25 DIAGNOSIS — F411 Generalized anxiety disorder: Secondary | ICD-10-CM | POA: Diagnosis not present

## 2015-10-25 DIAGNOSIS — F3132 Bipolar disorder, current episode depressed, moderate: Secondary | ICD-10-CM | POA: Diagnosis not present

## 2015-10-30 DIAGNOSIS — E221 Hyperprolactinemia: Secondary | ICD-10-CM | POA: Diagnosis not present

## 2015-10-30 DIAGNOSIS — M545 Low back pain: Secondary | ICD-10-CM | POA: Diagnosis not present

## 2015-10-30 DIAGNOSIS — M25562 Pain in left knee: Secondary | ICD-10-CM | POA: Diagnosis not present

## 2015-10-30 DIAGNOSIS — F419 Anxiety disorder, unspecified: Secondary | ICD-10-CM | POA: Diagnosis not present

## 2015-10-30 DIAGNOSIS — Q761 Klippel-Feil syndrome: Secondary | ICD-10-CM | POA: Diagnosis not present

## 2015-10-30 DIAGNOSIS — M5412 Radiculopathy, cervical region: Secondary | ICD-10-CM | POA: Diagnosis not present

## 2015-10-30 DIAGNOSIS — F3132 Bipolar disorder, current episode depressed, moderate: Secondary | ICD-10-CM | POA: Diagnosis not present

## 2015-10-30 DIAGNOSIS — G894 Chronic pain syndrome: Secondary | ICD-10-CM | POA: Diagnosis not present

## 2015-10-30 DIAGNOSIS — F411 Generalized anxiety disorder: Secondary | ICD-10-CM | POA: Diagnosis not present

## 2015-10-30 DIAGNOSIS — Z720 Tobacco use: Secondary | ICD-10-CM | POA: Diagnosis not present

## 2015-10-30 DIAGNOSIS — F1120 Opioid dependence, uncomplicated: Secondary | ICD-10-CM | POA: Diagnosis not present

## 2015-11-27 DIAGNOSIS — M5412 Radiculopathy, cervical region: Secondary | ICD-10-CM | POA: Diagnosis not present

## 2015-11-27 DIAGNOSIS — Z1389 Encounter for screening for other disorder: Secondary | ICD-10-CM | POA: Diagnosis not present

## 2015-11-27 DIAGNOSIS — G894 Chronic pain syndrome: Secondary | ICD-10-CM | POA: Diagnosis not present

## 2015-11-27 DIAGNOSIS — M545 Low back pain: Secondary | ICD-10-CM | POA: Diagnosis not present

## 2015-11-27 DIAGNOSIS — M25562 Pain in left knee: Secondary | ICD-10-CM | POA: Diagnosis not present

## 2015-11-27 DIAGNOSIS — F1120 Opioid dependence, uncomplicated: Secondary | ICD-10-CM | POA: Diagnosis not present

## 2015-12-13 DIAGNOSIS — F411 Generalized anxiety disorder: Secondary | ICD-10-CM | POA: Diagnosis not present

## 2015-12-13 DIAGNOSIS — F3132 Bipolar disorder, current episode depressed, moderate: Secondary | ICD-10-CM | POA: Diagnosis not present

## 2015-12-18 DIAGNOSIS — M545 Low back pain: Secondary | ICD-10-CM | POA: Diagnosis not present

## 2015-12-18 DIAGNOSIS — M5126 Other intervertebral disc displacement, lumbar region: Secondary | ICD-10-CM | POA: Diagnosis not present

## 2015-12-18 DIAGNOSIS — E039 Hypothyroidism, unspecified: Secondary | ICD-10-CM | POA: Diagnosis not present

## 2015-12-18 DIAGNOSIS — R002 Palpitations: Secondary | ICD-10-CM | POA: Diagnosis not present

## 2015-12-18 DIAGNOSIS — K219 Gastro-esophageal reflux disease without esophagitis: Secondary | ICD-10-CM | POA: Diagnosis not present

## 2015-12-18 DIAGNOSIS — R253 Fasciculation: Secondary | ICD-10-CM | POA: Diagnosis not present

## 2015-12-19 DIAGNOSIS — M549 Dorsalgia, unspecified: Secondary | ICD-10-CM | POA: Diagnosis not present

## 2015-12-19 DIAGNOSIS — R0602 Shortness of breath: Secondary | ICD-10-CM | POA: Diagnosis not present

## 2015-12-19 DIAGNOSIS — E872 Acidosis: Secondary | ICD-10-CM | POA: Diagnosis not present

## 2015-12-19 DIAGNOSIS — F3164 Bipolar disorder, current episode mixed, severe, with psychotic features: Secondary | ICD-10-CM | POA: Diagnosis not present

## 2015-12-19 DIAGNOSIS — G8929 Other chronic pain: Secondary | ICD-10-CM | POA: Diagnosis not present

## 2015-12-19 DIAGNOSIS — N289 Disorder of kidney and ureter, unspecified: Secondary | ICD-10-CM | POA: Diagnosis not present

## 2015-12-19 DIAGNOSIS — F17210 Nicotine dependence, cigarettes, uncomplicated: Secondary | ICD-10-CM | POA: Diagnosis not present

## 2015-12-19 DIAGNOSIS — R443 Hallucinations, unspecified: Secondary | ICD-10-CM | POA: Diagnosis not present

## 2015-12-19 DIAGNOSIS — Z781 Physical restraint status: Secondary | ICD-10-CM | POA: Diagnosis not present

## 2015-12-19 DIAGNOSIS — R845 Abnormal microbiological findings in specimens from respiratory organs and thorax: Secondary | ICD-10-CM | POA: Diagnosis not present

## 2015-12-19 DIAGNOSIS — F1410 Cocaine abuse, uncomplicated: Secondary | ICD-10-CM | POA: Diagnosis not present

## 2015-12-19 DIAGNOSIS — K7110 Toxic liver disease with hepatic necrosis, without coma: Secondary | ICD-10-CM | POA: Diagnosis not present

## 2015-12-19 DIAGNOSIS — F319 Bipolar disorder, unspecified: Secondary | ICD-10-CM | POA: Diagnosis not present

## 2015-12-19 DIAGNOSIS — R410 Disorientation, unspecified: Secondary | ICD-10-CM | POA: Diagnosis not present

## 2015-12-19 DIAGNOSIS — E785 Hyperlipidemia, unspecified: Secondary | ICD-10-CM | POA: Diagnosis not present

## 2015-12-19 DIAGNOSIS — N179 Acute kidney failure, unspecified: Secondary | ICD-10-CM | POA: Diagnosis not present

## 2015-12-19 DIAGNOSIS — M6282 Rhabdomyolysis: Secondary | ICD-10-CM | POA: Diagnosis not present

## 2015-12-19 DIAGNOSIS — F209 Schizophrenia, unspecified: Secondary | ICD-10-CM | POA: Diagnosis not present

## 2015-12-25 ENCOUNTER — Inpatient Hospital Stay (HOSPITAL_COMMUNITY)
Admission: AD | Admit: 2015-12-25 | Discharge: 2015-12-31 | DRG: 885 | Disposition: A | Discharge status: Home / Self Care | Payer: 59 | Attending: Psychiatry | Admitting: Psychiatry

## 2015-12-25 ENCOUNTER — Encounter (HOSPITAL_COMMUNITY): Payer: Self-pay

## 2015-12-25 DIAGNOSIS — F17209 Nicotine dependence, unspecified, with unspecified nicotine-induced disorders: Secondary | ICD-10-CM | POA: Diagnosis present

## 2015-12-25 DIAGNOSIS — F319 Bipolar disorder, unspecified: Secondary | ICD-10-CM | POA: Diagnosis present

## 2015-12-25 DIAGNOSIS — M549 Dorsalgia, unspecified: Secondary | ICD-10-CM | POA: Diagnosis present

## 2015-12-25 DIAGNOSIS — G8929 Other chronic pain: Secondary | ICD-10-CM | POA: Diagnosis present

## 2015-12-25 DIAGNOSIS — F3112 Bipolar disorder, current episode manic without psychotic features, moderate: Secondary | ICD-10-CM | POA: Diagnosis present

## 2015-12-25 DIAGNOSIS — Z8739 Personal history of other diseases of the musculoskeletal system and connective tissue: Secondary | ICD-10-CM

## 2015-12-25 DIAGNOSIS — F3164 Bipolar disorder, current episode mixed, severe, with psychotic features: Secondary | ICD-10-CM | POA: Diagnosis not present

## 2015-12-25 DIAGNOSIS — F3110 Bipolar disorder, current episode manic without psychotic features, unspecified: Secondary | ICD-10-CM | POA: Diagnosis present

## 2015-12-25 DIAGNOSIS — F1410 Cocaine abuse, uncomplicated: Secondary | ICD-10-CM | POA: Diagnosis present

## 2015-12-25 DIAGNOSIS — F17210 Nicotine dependence, cigarettes, uncomplicated: Secondary | ICD-10-CM | POA: Diagnosis present

## 2015-12-25 DIAGNOSIS — S36119A Unspecified injury of liver, initial encounter: Secondary | ICD-10-CM | POA: Diagnosis present

## 2015-12-25 DIAGNOSIS — Z87448 Personal history of other diseases of urinary system: Secondary | ICD-10-CM | POA: Diagnosis present

## 2015-12-25 MED ORDER — METOPROLOL TARTRATE 25 MG PO TABS
25.0000 mg | ORAL_TABLET | Freq: Two times a day (BID) | ORAL | Status: DC
  Administered 2015-12-25 – 2015-12-31 (×9): 25 mg via ORAL
  Filled 2015-12-25 (×17): qty 1

## 2015-12-25 MED ORDER — OXYCODONE HCL 5 MG PO TABS
10.0000 mg | ORAL_TABLET | Freq: Four times a day (QID) | ORAL | Status: DC | PRN
  Administered 2015-12-25 – 2015-12-31 (×21): 10 mg via ORAL
  Filled 2015-12-25 (×21): qty 2

## 2015-12-25 MED ORDER — LEVOTHYROXINE SODIUM 75 MCG PO TABS
75.0000 ug | ORAL_TABLET | Freq: Every day | ORAL | Status: DC
  Administered 2015-12-26 – 2015-12-31 (×6): 75 ug via ORAL
  Filled 2015-12-25 (×9): qty 1

## 2015-12-25 MED ORDER — HYDROXYZINE HCL 50 MG PO TABS
50.0000 mg | ORAL_TABLET | Freq: Four times a day (QID) | ORAL | Status: DC | PRN
Start: 2015-12-25 — End: 2015-12-31
  Administered 2015-12-26 – 2015-12-30 (×6): 50 mg via ORAL
  Filled 2015-12-25 (×7): qty 1

## 2015-12-25 MED ORDER — ACETAMINOPHEN 325 MG PO TABS
650.0000 mg | ORAL_TABLET | Freq: Four times a day (QID) | ORAL | Status: DC | PRN
  Filled 2015-12-25: qty 2

## 2015-12-25 MED ORDER — ALUM & MAG HYDROXIDE-SIMETH 200-200-20 MG/5ML PO SUSP: 30.0000 mL | ORAL | Status: DC | PRN

## 2015-12-25 MED ORDER — ASPIRIN 81 MG PO CHEW
81.0000 mg | CHEWABLE_TABLET | Freq: Every day | ORAL | Status: DC
  Administered 2015-12-25 – 2015-12-31 (×7): 81 mg via ORAL
  Filled 2015-12-25 (×11): qty 1

## 2015-12-25 MED ORDER — GABAPENTIN 300 MG PO CAPS
300.0000 mg | ORAL_CAPSULE | Freq: Three times a day (TID) | ORAL | Status: DC
  Administered 2015-12-25 – 2015-12-26 (×2): 300 mg via ORAL
  Filled 2015-12-25 (×6): qty 1

## 2015-12-25 MED ORDER — NICOTINE 21 MG/24HR TD PT24
21.0000 mg | MEDICATED_PATCH | Freq: Every day | TRANSDERMAL | Status: DC
  Administered 2015-12-25 – 2015-12-30 (×6): 21 mg via TRANSDERMAL
  Filled 2015-12-25 (×11): qty 1

## 2015-12-25 MED ORDER — MAGNESIUM HYDROXIDE 400 MG/5ML PO SUSP: 30.0000 mL | Freq: Every day | ORAL | Status: DC | PRN

## 2015-12-25 MED ORDER — OLANZAPINE 5 MG PO TBDP
5.0000 mg | ORAL_TABLET | Freq: Every day | ORAL | Status: DC
  Administered 2015-12-25: 5 mg via ORAL
  Filled 2015-12-25 (×3): qty 1

## 2015-12-25 NOTE — Progress Notes (Signed)
[name] is a 50 year old male being admitted involuntarily from [place] unit.  He has history of bipolar disorder with recent med changes.  He was found crushing cocaine and motrin then snorting as well as drinking windex because he thought it was blue koolaid.  He was agresive in the ICU requiring restraints but is now cooperative.  He has been medically cleared and currently in a depressed state.  RN reported that he has a history of suicidal ideation after a manic episode and his family wants him to be hospitalized to keep this from happening.  He is currently taking oxycodone 10mg  q 6 hours for chronic back/neck pain.  He has a nerve stimulator in his right hip.  Admission paperwork completed and signed.  Belongings searched and secured in locker # 91.  Skin assessment completed and noted left tib/fib fracture scar, left buttocks incision due to nerve stimulator, front of neck scar and bruising on both upper arms.  Q 15 minute checks initiated for safety.  We will monitor the progress towards his goals.

## 2015-12-25 NOTE — Progress Notes (Signed)
D: Pt A & O X 3. Presents with flat affect and depressed mood on initial contact. Denied SI, HI and AVH; rated his back pain 8/10 when assessed. Compliant with his scheduled medications when offered.  A: Introduced self to pt. Scheduled and PRN evening medications administered as ordered. Support and encouragement offered. Scientist, physiological for Neuro stimulator from pt's wife and plugged device in med room to charge. Oncoming RN was informed of Care order as well. Q 15 minutes checks maintained without self harm gestures or outburst to note thus far.  R: Pt receptive to care. Denies adverse drug reactions. Remains safe on unit.

## 2015-12-25 NOTE — Progress Notes (Signed)
D: Pt denies SI/HI/AV. Pt is pleasant and cooperative. Pt goal for today is to get adjusted to unit. A: Pt was offered support and encouragement. Pt was given scheduled medications. Pt was encourage to attend groups. Q 15 minute checks were done for safety.  R:Pt attends groups and interacts well with peers and staff. Pt is taking medication. Pt has no complaints.Pt receptive to treatment and safety maintained on unit.

## 2015-12-25 NOTE — Tx Team (Addendum)
Initial Interdisciplinary Treatment Plan   PATIENT STRESSORS: Health problems Marital or family conflict Substance abuse   PATIENT STRENGTHS: Average or above average intelligence Communication skills   PROBLEM LIST: Problem List/Patient Goals Date to be addressed Date deferred Reason deferred Estimated date of resolution  Depression 12/25/15     Mania 12/25/15     Substance abuse 12/25/15     Psychosis  12/25/15     "Get balanced back out" 12/25/15     "Have the meds back to the way they used to be" 12/25/15     Risk for suicide                   DISCHARGE CRITERIA:  Improved stabilization in mood, thinking, and/or behavior Verbal commitment to aftercare and medication compliance  PRELIMINARY DISCHARGE PLAN: Outpatient therapy Medication management  PATIENT/FAMIILY INVOLVEMENT: This treatment plan has been presented to and reviewed with the patient, [name].  The patient and family have been given the opportunity to ask questions and make suggestions.  Barbette Or Reddick 12/25/2015, 7:33 PM

## 2015-12-25 NOTE — [facility] Group Notes (Signed)
Patient attend group. His day was a 5. He meet his goal to stay positive and look for other alternatives.

## 2015-12-25 NOTE — BH Assessment (Signed)
Tele Assessment Note   [name] is a 50 y.o. male who presented to [facility] on 12/19/2015. He was assessed by Krystal Eaton, LPC, on that day. Below is a copy of the assessment completed:  Pt [name] is a 50 year old (dob 03/26/1966) male with a history of Bipolar I who presented voluntarily to [facility] accompanied by Frankey Poot. Per report, Pt contacted Frankey Poot and asked him to come to Pt's house. Upon arrival, Frankey Poot found Pt in an agitated state. Pt claimed that his brother in the house and was trying to steal from him. Er Pt's daughter's report, Pt's brother has been deceased for several years and that Pt is subject to manic episodes. Pt reported the following to author: He stated that his brother has been in his house, and that brother is stealing from him. Pt also acknowledges that brother is deceased. When asked for more information, Pt's speech became increasingly rapid, and his speech exhibited tangentiality and word salad. Pt's speech was incoherent at times. Pt denied suicidal ideation and homicidal ideation When asked about auditory/visual hallucination, Pt indicated that he is "seeing things." It was unclear whether he was experiencing auditory hallucination. Likewise, it was unclear as to whether Pt had insight into the nature of his recent delusion -- that his deceased brother was stealing from him. He acknowledged brother's death, but also believes brother is in and out of his home today.   Diagnosis: Bipolar I, Manic Episode  Past Medical History:  Past Medical History:  Diagnosis Date  . Anxiety   . Arthritis   . Bipolar affective disorder ([place])   . Chronic back pain   . DDD (degenerative disc disease)   . Depression   . DJD (degenerative joint disease)   . Elevated liver function tests   . Fibromyalgia   . GERD (gastroesophageal reflux disease)   . Head injury   . Hyperlipidemia   . Hypothyroidism   . IBS (irritable bowel syndrome)   . Inguinal hernia     left  . Myofascial pain syndrome   . Osteopenia   . Sleep apnea   . Tibial plateau fracture, left     Past Surgical History:  Procedure Laterality Date  . CARDIAC CATHETERIZATION N/A 08/31/2015   Procedure: Left Heart Cath and Coronary Angiography;  Surgeon: Leonie Man, MD;  Location: [place] [facility];  Service: Cardiovascular;  Laterality: N/A;  . Carotid Dopplers Bilateral 02/20/2015   [facility]: Mild, less than 39% left and right internal carotid artery stenosis. No significant plaque burden  . CERVICAL DISC SURGERY     C5-7  . COLONOSCOPY    . COLONOSCOPY    . ESOPHAGOGASTRODUODENOSCOPY    . FASCIOTOMY Left 10/20/2013   Procedure: LEFT ANTERIOR COMPARTMENT FACSCIOTOMY;  Surgeon: Rozanna Box, MD;  Location: [place];  Service: Orthopedics;  Laterality: Left;  . INGUINAL HERNIA REPAIR Left   . Nuclear Stress Test  03/06/2015    [facility]: Normal EKG. Low normal EF (49%) normal regional wall motion. No evidence of ischemia or infarction.  . ORIF TIBIA PLATEAU Left 10/20/2013   Procedure: OPEN REDUCTION INTERNAL FIXATION (ORIF) LEFT TIBIAL PLATEAU;  Surgeon: Rozanna Box, MD;  Location: [place];  Service: Orthopedics;  Laterality: Left;  . SEPTOPLASTY    . SPINAL CORD STIMULATOR IMPLANT    . TRANSTHORACIC ECHOCARDIOGRAM  02/20/2015   [facility]: Mild concentric LVH. EF 55-60%. Normal regional wall motion. Mild to moderate  TR with no significant pulmonary hypertension.    Family History:  Family History  Problem Relation Age of Onset  . Heart disease Mother   . Diabetes    . Kidney disease    . Diabetes Brother   . Lung cancer Maternal Grandmother   . Diabetes Paternal Grandmother   . Kidney disease Brother   . Colon cancer Neg Hx   . Hypertension Mother   . Hypertension Brother   . Hyperlipidemia Father   . Heart attack Father 29    At least 5 MIs. Had CABG.  . Heart failure Father   . Diabetes Father   . Hypertension  Father   . Kidney disease Father   . Sudden death Mother     Presumably cardiac  . Sudden death Paternal Grandmother     Unclear etiology  . Sudden death Brother     Thought to be related to heart disease  . Throat cancer Brother     Social History:  reports that he has been smoking Cigarettes.  He has a 33.00 pack-year smoking history. He quit smokeless tobacco use about 10 months ago. He reports that he does not drink alcohol or use drugs.  Additional Social History:     CIWA: CIWA-Ar BP: 91/60 Pulse Rate: 90 COWS:    PATIENT STRENGTHS: (choose at least two) Average or above average intelligence Capable of independent living Motivation for treatment/growth  Allergies:  Allergies  Allergen Reactions  . Zolpidem Tartrate Other (See Comments)    Hallucinations and sleep walks  . Demerol [Meperidine] Itching and Rash    Home Medications:  Medications Prior to Admission  Medication Sig Dispense Refill  . aspirin EC 81 MG tablet Take 1 tablet (81 mg total) by mouth daily. 30 tablet 11  . buPROPion (WELLBUTRIN XL) 300 MG 24 hr tablet Take 300 mg by mouth daily.    . busPIRone (BUSPAR) 15 MG tablet Take 15 mg by mouth 2 (two) times daily.    Marland Kitchen esomeprazole (NEXIUM) 40 MG capsule Take 40 mg by mouth at bedtime.    Marland Kitchen FLUoxetine (PROZAC) 40 MG capsule Take 40 mg by mouth daily.    . hydrOXYzine (VISTARIL) 100 MG capsule Take 100 mg by mouth at bedtime. Reported on 08/24/2015    . hyoscyamine (LEVBID) 0.375 MG 12 hr tablet Take 1 tablet (0.375 mg total) by mouth every 12 (twelve) hours as needed for cramping. 60 tablet 11  . Iloperidone (FANAPT) 12 MG TABS Take 1 tablet by mouth daily.    Marland Kitchen levothyroxine (SYNTHROID, LEVOTHROID) 75 MCG tablet Take 75 mcg by mouth every evening.    . meloxicam (MOBIC) 7.5 MG tablet Take 1 tablet (7.5 mg total) by mouth daily. 14 tablet 0  . nitroGLYCERIN (NITROSTAT) 0.4 MG SL tablet Place 1 tablet (0.4 mg total) under the tongue every 5 (five)  minutes as needed for chest pain. 20 tablet 0  . pravastatin (PRAVACHOL) 40 MG tablet Take 40 mg by mouth at bedtime.     . ranolazine (RANEXA) 1000 MG SR tablet Take 1,000 mg by mouth 2 (two) times daily.      OB/GYN Status:  No LMP for male patient.  General Assessment Data Location of Assessment: [facility] Services TTS Assessment: Out of system Is this a Tele or Face-to-Face Assessment?: Tele Assessment Is this an Initial Assessment or a Re-assessment for this encounter?: Initial Assessment Marital status: Married Is patient pregnant?: No Pregnancy Status: No Living Arrangements: Alone Can pt  return to current living arrangement?: Yes Admission Status: Involuntary Referral Source: Self/Family/Friend     Crisis Care Plan Living Arrangements: Alone Name of Psychiatrist: Dr. Darleene Cleaver Name of Therapist: none  Education Status Is patient currently in school?: No  Risk to self with the past 6 months Suicidal Ideation: No  Risk to Others within the past 6 months Homicidal Ideation: No Does patient have any lifetime risk of violence toward others beyond the six months prior to admission? : No Criminal Charges Pending?: No Does patient have a court date: No Is patient on probation?: No  Psychosis Hallucinations: Visual Delusions: Unspecified  Mental Status Report Appearance/Hygiene: Unremarkable Eye Contact: Poor Motor Activity: Unable to assess Speech: Rapid, Pressured, Word salad, Incoherent, Tangential Level of Consciousness: Alert Mood: Euthymic Affect: Appropriate to circumstance Thought Processes: Unable to Assess Judgement: Impaired Orientation: Unable to assess Obsessive Compulsive Thoughts/Behaviors: Unable to Assess  Cognitive Functioning Concentration: Fair Memory: Recent Impaired, Remote Impaired IQ: Average Insight: see judgement above Impulse Control: Unable to Assess Vegetative Symptoms: None  ADLScreening [facility] Assessment  Services) Patient's cognitive ability adequate to safely complete daily activities?: Yes Patient able to express need for assistance with ADLs?: Yes Independently performs ADLs?: Yes (appropriate for developmental age)  Prior Inpatient Therapy Prior Inpatient Therapy: Yes Prior Therapy Dates: @ 16 yrs ago  Prior Outpatient Therapy Prior Outpatient Therapy: No Does patient have an ACCT team?: No Does patient have Intensive In-House Services?  : No Does patient have [facility] services? : No Does patient have P4CC services?: No  ADL Screening (condition at time of admission) Patient's cognitive ability adequate to safely complete daily activities?: Yes Is the patient deaf or have difficulty hearing?: No Does the patient have difficulty seeing, even when wearing glasses/contacts?: No Does the patient have difficulty concentrating, remembering, or making decisions?: No Patient able to express need for assistance with ADLs?: Yes Does the patient have difficulty dressing or bathing?: No Independently performs ADLs?: Yes (appropriate for developmental age) Does the patient have difficulty walking or climbing stairs?: No Weakness of Legs: None Weakness of Arms/Hands: None  Home Assistive Devices/Equipment Home Assistive Devices/Equipment: None  Therapy Consults (therapy consults require a physician order) PT Evaluation Needed: No OT Evalulation Needed: No SLP Evaluation Needed: No Abuse/Neglect Assessment (Assessment to be complete while patient is alone) Physical Abuse: Denies Verbal Abuse: Denies Sexual Abuse: Denies Exploitation of patient/patient's resources: Denies Self-Neglect: Denies Values / Beliefs Cultural Requests During Hospitalization: None Spiritual Requests During Hospitalization: None Consults Spiritual Care Consult Needed: No Social Work Consult Needed: No Regulatory affairs officer (For Healthcare) Does patient have an advance directive?: No Would patient like  information on creating an advanced directive?: No - patient declined information    Additional Information 1:1 In Past 12 Months?: No CIRT Risk: No Elopement Risk: No Does patient have medical clearance?: Yes     Disposition:  Disposition Initial Assessment Completed for this Encounter: Yes (consulted with Catalina Pizza, DNP) Disposition of Patient: Inpatient treatment program Type of inpatient treatment program: Adult (Pt accepted to [facility] 505-02)  Rexene Edison 12/25/2015 4:28 PM

## 2015-12-26 ENCOUNTER — Encounter (HOSPITAL_COMMUNITY): Payer: Self-pay | Admitting: Psychiatry

## 2015-12-26 DIAGNOSIS — Z87448 Personal history of other diseases of urinary system: Secondary | ICD-10-CM

## 2015-12-26 DIAGNOSIS — F1410 Cocaine abuse, uncomplicated: Secondary | ICD-10-CM | POA: Diagnosis present

## 2015-12-26 DIAGNOSIS — Z8739 Personal history of other diseases of the musculoskeletal system and connective tissue: Secondary | ICD-10-CM

## 2015-12-26 DIAGNOSIS — S36119A Unspecified injury of liver, initial encounter: Secondary | ICD-10-CM | POA: Diagnosis present

## 2015-12-26 DIAGNOSIS — F3164 Bipolar disorder, current episode mixed, severe, with psychotic features: Secondary | ICD-10-CM | POA: Diagnosis present

## 2015-12-26 HISTORY — DX: Cocaine abuse, uncomplicated: F14.10

## 2015-12-26 HISTORY — DX: Personal history of other diseases of the musculoskeletal system and connective tissue: Z87.39

## 2015-12-26 HISTORY — DX: Unspecified injury of liver, initial encounter: S36.119A

## 2015-12-26 HISTORY — DX: Personal history of other diseases of urinary system: Z87.448

## 2015-12-26 MED ORDER — GABAPENTIN 300 MG PO CAPS
600.0000 mg | ORAL_CAPSULE | Freq: Three times a day (TID) | ORAL | Status: DC
  Administered 2015-12-26 (×2): 600 mg via ORAL
  Filled 2015-12-26 (×6): qty 2

## 2015-12-26 MED ORDER — OLANZAPINE 5 MG PO TBDP
5.0000 mg | ORAL_TABLET | Freq: Two times a day (BID) | ORAL | Status: DC | PRN
  Administered 2015-12-29: 5 mg via ORAL
  Filled 2015-12-26: qty 1

## 2015-12-26 MED ORDER — OLANZAPINE 10 MG PO TBDP
10.0000 mg | ORAL_TABLET | Freq: Every day | ORAL | Status: DC
  Filled 2015-12-26 (×2): qty 1

## 2015-12-26 MED ORDER — FLUOXETINE HCL 20 MG PO CAPS
40.0000 mg | ORAL_CAPSULE | Freq: Every day | ORAL | Status: DC
  Administered 2015-12-26: 40 mg via ORAL
  Filled 2015-12-26 (×3): qty 2

## 2015-12-26 MED ORDER — OLANZAPINE 5 MG PO TBDP
5.0000 mg | ORAL_TABLET | Freq: Every day | ORAL | Status: DC
  Administered 2015-12-26 – 2015-12-30 (×5): 5 mg via ORAL
  Filled 2015-12-26 (×7): qty 1

## 2015-12-26 MED ORDER — PANTOPRAZOLE SODIUM 40 MG PO TBEC
40.0000 mg | DELAYED_RELEASE_TABLET | Freq: Two times a day (BID) | ORAL | Status: DC
  Administered 2015-12-26 – 2015-12-31 (×10): 40 mg via ORAL
  Filled 2015-12-26 (×14): qty 1

## 2015-12-26 MED ORDER — NITROGLYCERIN 0.4 MG SL SUBL: 0.4000 mg | SUBLINGUAL_TABLET | SUBLINGUAL | Status: DC | PRN

## 2015-12-26 MED ORDER — PRAVASTATIN SODIUM 40 MG PO TABS
40.0000 mg | ORAL_TABLET | Freq: Every day | ORAL | Status: DC
  Filled 2015-12-26: qty 1

## 2015-12-26 MED ORDER — TIZANIDINE HCL 2 MG PO TABS
2.0000 mg | ORAL_TABLET | Freq: Three times a day (TID) | ORAL | Status: DC | PRN
  Administered 2015-12-29 – 2015-12-31 (×5): 2 mg via ORAL

## 2015-12-26 MED ORDER — OLANZAPINE 10 MG IM SOLR: 5.0000 mg | Freq: Two times a day (BID) | INTRAMUSCULAR | Status: DC | PRN

## 2015-12-26 NOTE — Progress Notes (Signed)
Patient ID: [name], male   DOB: 24-Nov-1965, 50 y.o.   MRN: [id number] Patient denies SI, HI and AVH this shift.  Patient stated that his depression was low, but he did have anxiety that rated 6/7.  Patient reported that he was experiencing back pain but was able to use his neurostimulator and pain medications to help keep his pain at a tolerable level.    Assess patient for safety, offer medications as prescribed, engage patient in 1:1 staff talks.   Continue to monitor as prescribed. Patient able to contract for safety.

## 2015-12-26 NOTE — [facility] Suicide Risk Assessment (Signed)
[facility] Admission Suicide Risk Assessment   Nursing information obtained from:  Patient Demographic factors:  Male, Caucasian, Living alone, Unemployed Current Mental Status:  NA Loss Factors:  Decline in physical health Historical Factors:  Prior suicide attempts, Impulsivity Risk Reduction Factors:  Sense of responsibility to family  Total Time spent with patient: 30 minutes Principal Problem: Bipolar disorder, curr episode mixed, severe, with psychotic features ([place]) Diagnosis:   Patient Active Problem List   Diagnosis Date Noted  . Bipolar disorder, curr episode mixed, severe, with psychotic features ([place]) [F31.64] 12/26/2015  . Cocaine use disorder, mild, abuse [F14.10] 12/26/2015  . Angina, class III ([place]) [I20.9] 08/18/2015  . Dyslipidemia, goal LDL below 70 [E78.5] 08/18/2015  . Family history of premature coronary artery disease [Z82.49] 08/18/2015  . Tobacco use disorder, continuous [Z72.0] 08/18/2015  . Tibial plateau fracture [S82.143A] 10/02/2013  . Multiple falls [R29.6] 10/02/2013  . Alteration in self-care ability [R53.81] 10/02/2013  . 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 Fracture of tibia with fibula, left, closed [S82.92XA] 10/02/2013  . Chronic back pain [M54.9, G89.29] 10/02/2013  . 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 History of suicidal ideation [Z86.59] 10/02/2013  . Postural imbalance with levoscoliosis, h/o [R29.3] 10/02/2013  . Chest pain [R07.9] 12/25/2012  . IRRITABLE BOWEL SYNDROME [K58.9] 08/09/2008  . HYPERLIPIDEMIA [E78.5] 10/28/2006  . DISORDER, BIPOLAR NOS [F31.9] 10/28/2006  . ANXIETY [F41.1] 10/28/2006  . DEPRESSION [F32.9] 10/28/2006  . PAIN, CHRONIC NEC [G89.29] 10/28/2006  . GERD [K21.9] 10/28/2006   Subjective Data:Please see H&P.   Continued Clinical Symptoms:  Alcohol Use Disorder Identification Test Final Score (AUDIT): 0 The "Alcohol Use Disorders Identification Test", Guidelines for Use in  Primary Care, Second Edition.  World Pharmacologist [facility]). Score between 0-7:  no or low risk or alcohol related problems. Score between 8-15:  moderate risk of alcohol related problems. Score between 16-19:  high risk of alcohol related problems. Score 20 or above:  warrants further diagnostic evaluation for alcohol dependence and treatment.   CLINICAL FACTORS:   Alcohol/Substance Abuse/Dependencies Previous Psychiatric Diagnoses and Treatments   Musculoskeletal: Strength & Muscle Tone: within normal limits Gait & Station: normal Patient leans: N/A  Psychiatric Specialty Exam: Physical Exam  Nursing note and vitals reviewed.   Review of Systems  Psychiatric/Behavioral: Positive for depression and hallucinations. The patient is nervous/anxious.   All other systems reviewed and are negative.   Blood pressure 97/67, pulse 84, temperature 98.1 F (36.7 C), temperature source Oral, resp. rate 18, height 6' 0.5" (1.842 m), weight 100.5 kg (221 lb 8 oz), SpO2 97 %.Body mass index is 29.63 kg/m.          Please see H&P.                                                 COGNITIVE FEATURES THAT CONTRIBUTE TO RISK:  Closed-mindedness, Polarized thinking and Thought constriction (tunnel vision)    SUICIDE RISK:   Moderate:  Frequent suicidal ideation with limited intensity, and duration, some specificity in terms of plans, no associated intent, good self-control, limited dysphoria/symptomatology, some risk factors present, and identifiable protective factors, including available and accessible social support.   PLAN OF CARE: Please see H&P.   I certify that inpatient services furnished can reasonably be expected to improve the patient's condition.  Der Gagliano, MD 12/26/2015, 1:30  PM

## 2015-12-26 NOTE — [facility] Group Notes (Signed)
[facility] Mental Health Association Group Therapy  12/26/2015 , 3:32 PM    Type of Therapy:  Mental Health Association Presentation  Participation Level:  Active  Participation Quality:  Attentive  Affect:  Blunted  Cognitive:  Oriented  Insight:  Limited  Engagement in Therapy:  Engaged  Modes of Intervention:  Discussion, Education and Socialization  Summary of Progress/Problems:  Shanon Brow from [place] came to present his recovery story and play the guitar.  Stayed the entire time, engaged throughout.  Roque Lias B 12/26/2015 , 3:32 PM

## 2015-12-26 NOTE — H&P (Addendum)
Psychiatric Admission Assessment Adult  Patient Identification: [name] MRN:  [id number] Date of Evaluation:  12/26/2015 Chief Complaint: Patient states " I lost 5 days , I have no recollection of that. I had a bad reaction to [place] which they just put me on.'    Principal Diagnosis: Bipolar disorder, curr episode mixed, severe, with psychotic features ([place]) Diagnosis:   Patient Active Problem List   Diagnosis Date Noted  . Bipolar disorder, curr episode mixed, severe, with psychotic features ([place]) [F31.64] 12/26/2015  . Cocaine use disorder, mild, abuse [F14.10] 12/26/2015  . History of rhabdomyolysis [Z87.39] 12/26/2015  . Liver injury [S36.119A] 12/26/2015  . Hx of renal failure [Z87.448] 12/26/2015  . Angina, class III ([place]) [I20.9] 08/18/2015  . Dyslipidemia, goal LDL below 70 [E78.5] 08/18/2015  . Family history of premature coronary artery disease [Z82.49] 08/18/2015  . Tobacco use disorder, continuous [Z72.0] 08/18/2015  . Tibial plateau fracture [S82.143A] 10/02/2013  . Multiple falls [R29.6] 10/02/2013  . Alteration in self-care ability [R53.81] 10/02/2013  . 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 Fracture of tibia with fibula, left, closed [S82.92XA] 10/02/2013  . Chronic back pain [M54.9, G89.29] 10/02/2013  . 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 History of suicidal ideation [Z86.59] 10/02/2013  . Postural imbalance with levoscoliosis, h/o [R29.3] 10/02/2013  . Chest pain [R07.9] 12/25/2012  . IRRITABLE BOWEL SYNDROME [K58.9] 08/09/2008  . HYPERLIPIDEMIA [E78.5] 10/28/2006  . DISORDER, BIPOLAR NOS [F31.9] 10/28/2006  . ANXIETY [F41.1] 10/28/2006  . DEPRESSION [F32.9] 10/28/2006  . PAIN, CHRONIC NEC [G89.29] 10/28/2006  . GERD [K21.9] 10/28/2006   History of Present Illness: [name] is a 50 year old caucasian male who is separated , lives in [place] by self , presented  involuntarily from [place] unit,  where he was initially admitted for Rhabdomyolysis , Acute liver injury and renal failure  - possible reaction to his medications - paravachol or SSRI/SNRI. Pt was medically cleared and transported to [facility].  Per initial notes in EHR : " He has history of bipolar disorder with recent med changes.  He was found crushing cocaine and motrin then snorting as well as drinking windex because he thought it was blue koolaid.  He was agresive in the ICU requiring restraints but is now cooperative.  He has been medically cleared and currently in a depressed state.  He has a history of suicidal ideation after a manic episode and his family wants him to be hospitalized to keep this from happening.  "   Patient seen and chart reviewed today .Discussed patient with treatment team. Patient today is seen as less anxious and less restless. Pt reports he does not recall what he did and whether he had taken cocaine, motrin and windex since he has no memory of what happened. He reports that his out patient provider just changed his fanapt to [place]uda and he possibly had a bad reaction to latuda.  Pt with pressured speech , appears guarded , reports continued anxiety sx. Pt reports he has a hx of bipolar disorder and panic attacks. Pt describes his panic sx as having racing heart rate , chest pain and SOB and a feeling of doom. Pt reports he has been taking buspar , prozac, wellbutrin for the same.  Pt reports VH on admission - of seeing his dead brother , but currently denies it. Pt also had AH in the past , currently denies it.  Pt also denies regular abuse of cocaine , alcohol or any  other drugs - reports he does not remember why he used it prior to admission  Per collateral information from [name] - [id number] - wife - per her pt was seeing things since a month and hence Dr. Darleene Cleaver changed his [place] to [place] . But patient decomopensated and he was hence restarted on fanapt - but he became more and more confused  and hence was taken to [facility].Per wife patient is mostly depressed and has some manic sx on and off. She manages his medications usually - he also has back pain - uses a neurostimulator - he sometimes impulsively uses his oxycodone and hence she gives it to him daily.    Associated Signs/Symptoms: Depression Symptoms:  depressed mood, difficulty concentrating, anxiety, (Hypo) Manic Symptoms:  Distractibility, Impulsivity, Labiality of Mood, Anxiety Symptoms:  Excessive Worry, Panic Symptoms, Social Anxiety, Psychotic Symptoms:  see above PTSD Symptoms: Negative Total Time spent with patient: 1 hour  Past Psychiatric History: Patient with hx of Bipolar disorder, follows up with Dr.Akintayo. Pt has had few admissions - IP in the past - [place]- 2011, [facility] .Reports 1 suicide attempt.  Is the patient at risk to self? Yes.    Has the patient been a risk to self in the past 6 months? Yes.    Has the patient been a risk to self within the distant past? Yes.    Is the patient a risk to others? No.  Has the patient been a risk to others in the past 6 months? No.  Has the patient been a risk to others within the distant past? No.   Prior Inpatient Therapy: Prior Inpatient Therapy: Yes Prior Therapy Dates: @ 16 yrs ago Prior Outpatient Therapy: Prior Outpatient Therapy: No Does patient have an ACCT team?: No Does patient have Intensive In-House Services?  : No Does patient have [facility] services? : No Does patient have P4CC services?: No  Alcohol Screening: 1. How often do you have a drink containing alcohol?: Never 9. Have you or someone else been injured as a result of your drinking?: No 10. Has a relative or friend or a doctor or another health worker been concerned about your drinking or suggested you cut down?: No Alcohol Use Disorder Identification Test Final Score (AUDIT): 0 Brief Intervention: AUDIT score less than 7 or less-screening does not suggest unhealthy  drinking-brief intervention not indicated Substance Abuse History in the last 12 months:  Yes.  cocaine - unknown how much  Consequences of Substance Abuse: Medical Consequences:  recent admission Previous Psychotropic Medications: Yes seroquel, buspar Psychological Evaluations: No  Past Medical History:  Past Medical History:  Diagnosis Date  . Anxiety   . Arthritis   . Bipolar affective disorder ([place])   . Chronic back pain   . DDD (degenerative disc disease)   . Depression   . DJD (degenerative joint disease)   . Elevated liver function tests   . Fibromyalgia   . GERD (gastroesophageal reflux disease)   . Head injury   . Hyperlipidemia   . Hypothyroidism   . IBS (irritable bowel syndrome)   . Inguinal hernia    left  . Myofascial pain syndrome   . Osteopenia   . Sleep apnea   . Tibial plateau fracture, left     Past Surgical History:  Procedure Laterality Date  . CARDIAC CATHETERIZATION N/A 08/31/2015   Procedure: Left Heart Cath and Coronary Angiography;  Surgeon: Leonie Man, MD;  Location: [place] [facility];  Service: Cardiovascular;  Laterality: N/A;  . Carotid Dopplers Bilateral 02/20/2015   [facility]: Mild, less than 39% left and right internal carotid artery stenosis. No significant plaque burden  . CERVICAL DISC SURGERY     C5-7  . COLONOSCOPY    . COLONOSCOPY    . ESOPHAGOGASTRODUODENOSCOPY    . FASCIOTOMY Left 10/20/2013   Procedure: LEFT ANTERIOR COMPARTMENT FACSCIOTOMY;  Surgeon: Rozanna Box, MD;  Location: [place];  Service: Orthopedics;  Laterality: Left;  . INGUINAL HERNIA REPAIR Left   . Nuclear Stress Test  03/06/2015   [facility]: Normal EKG. Low normal EF (49%) normal regional wall motion. No evidence of ischemia or infarction.  . ORIF TIBIA PLATEAU Left 10/20/2013   Procedure: OPEN REDUCTION INTERNAL FIXATION (ORIF) LEFT TIBIAL PLATEAU;  Surgeon: Rozanna Box, MD;  Location: [place];  Service: Orthopedics;  Laterality:  Left;  . SEPTOPLASTY    . SPINAL CORD STIMULATOR IMPLANT    . TRANSTHORACIC ECHOCARDIOGRAM  02/20/2015   [facility]: Mild concentric LVH. EF 55-60%. Normal regional wall motion. Mild to moderate TR with no significant pulmonary hypertension.   Family History:  Family History  Problem Relation Age of Onset  . Heart disease Mother   . Hypertension Mother   . Sudden death Mother     Presumably cardiac  . Hyperlipidemia Father   . Heart attack Father 69    At least 5 MIs. Had CABG.  . Heart failure Father   . Diabetes Father   . Hypertension Father   . Kidney disease Father   . Diabetes Brother   . Lung cancer Maternal Grandmother   . Diabetes Paternal Grandmother   . Sudden death Paternal Grandmother     Unclear etiology  . Diabetes    . Kidney disease    . Kidney disease Brother   . Hypertension Brother   . Sudden death Brother     Thought to be related to heart disease  . Throat cancer Brother   . Colon cancer Neg Hx    Family Psychiatric  History: denies Tobacco Screening:uses a vaporized - offered nicotine patch Social History: separated , lives by self in [place], wife is supportive , is on SSD. 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 Zolpidem Tartrate Other (See Comments)    Hallucinations and sleep walks  . Demerol [Meperidine] Itching and Rash   Lab Results: No results found for this or any previous visit (from the past 48 hour(s)).  Blood Alcohol level:  Lab Results  Component Value Date   [facility] <11 10/02/2013   ETH <11 [id number]    Metabolic Disorder Labs:  Lab Results  Component  Value Date   HGBA1C 5.1 12/26/2012   MPG 100 12/26/2012   No results found for: PROLACTIN Lab Results  Component Value Date   CHOL 207 (H) 12/26/2012   TRIG 327 (H) 12/26/2012   HDL 42 12/26/2012   CHOLHDL 4.9 12/26/2012   VLDL 65 (H) 12/26/2012   LDLCALC 100 (H) 12/26/2012   LDLCALC 69 01/28/2011    Current Medications: Current Facility-Administered Medications  Medication Dose Route Frequency Provider Last Rate Last Dose  . acetaminophen (TYLENOL) tablet 650 mg  650 mg Oral Q6H PRN Niel Hummer, NP      . alum & mag hydroxide-simeth (MAALOX/MYLANTA) 200-200-20 MG/5ML suspension 30 mL  30 mL Oral Q4H PRN Niel Hummer, NP      . aspirin chewable tablet 81 mg  81 mg Oral Daily Niel Hummer, NP   81 mg at 12/26/15 0800  . FLUoxetine (PROZAC) capsule 40 mg  40 mg Oral Daily Hollyanne Schloesser, MD   40 mg at 12/26/15 1210  . gabapentin (NEURONTIN) capsule 600 mg  600 mg Oral TID Ursula Alert, MD   600 mg at 12/26/15 1210  . hydrOXYzine (ATARAX/VISTARIL) tablet 50 mg  50 mg Oral Q6H PRN Niel Hummer, NP      . levothyroxine (SYNTHROID, LEVOTHROID) tablet 75 mcg  75 mcg Oral QAC breakfast Niel Hummer, NP   75 mcg at 12/26/15 [id number]  . magnesium hydroxide (MILK OF MAGNESIA) suspension 30 mL  30 mL Oral Daily PRN Niel Hummer, NP      . metoprolol tartrate (LOPRESSOR) tablet 25 mg  25 mg Oral BID Niel Hummer, NP   25 mg at 12/26/15 0800  . nicotine (NICODERM CQ - dosed in mg/24 hours) patch 21 mg  21 mg Transdermal Daily Ursula Alert, MD   21 mg at 12/26/15 0803  . nitroGLYCERIN (NITROSTAT) SL tablet 0.4 mg   0.4 mg Sublingual Q5 min PRN Roddie Riegler, MD      . OLANZapine zydis (ZYPREXA) disintegrating tablet 5 mg  5 mg Oral BID PRN Ursula Alert, MD       Or  . OLANZapine (ZYPREXA) injection 5 mg  5 mg Intramuscular BID PRN Ursula Alert, MD      . OLANZapine zydis (ZYPREXA) disintegrating tablet 5 mg  5 mg Oral QHS Maximiliano Cromartie, MD      . oxyCODONE (Oxy IR/ROXICODONE) immediate release tablet 10 mg  10 mg Oral Q6H PRN Niel Hummer, NP   10 mg at 12/26/15 1214  . pantoprazole (PROTONIX) EC tablet 40 mg  40 mg Oral BID AC Mariette Cowley, MD      . tiZANidine (ZANAFLEX) tablet 2 mg  2 mg Oral Q8H PRN Ursula Alert, MD       PTA Medications: Prescriptions Prior to Admission  Medication Sig Dispense Refill Last Dose  . aspirin EC 81 MG tablet Take 1 tablet (81 mg total) by mouth daily. 30 tablet 11 12/25/2015  . buPROPion (WELLBUTRIN XL) 300 MG 24 hr tablet Take 300 mg by mouth daily.   12/25/2015  . busPIRone (BUSPAR) 15 MG tablet Take 15 mg by mouth 2 (two) times daily.   12/25/2015  . esomeprazole (NEXIUM) 40 MG capsule Take 40 mg by mouth at bedtime.   12/25/2015  . FLUoxetine (PROZAC) 40 MG capsule Take 40 mg by mouth daily.   12/25/2015  . hydrOXYzine (VISTARIL) 100 MG capsule Take 100 mg by mouth at bedtime. Reported on 08/24/2015  12/25/2015  . hyoscyamine (LEVBID) 0.375 MG 12 hr tablet Take 1 tablet (0.375 mg total) by mouth every 12 (twelve) hours as needed for cramping. 60 tablet 11 Past Month  . Iloperidone (FANAPT) 12 MG TABS Take 1 tablet by mouth daily.   12/25/2015  . levothyroxine (SYNTHROID, LEVOTHROID) 75 MCG tablet Take 75 mcg by mouth every evening.   12/26/2015  . nitroGLYCERIN (NITROSTAT) 0.4 MG SL tablet Place 1 tablet (0.4 mg total) under the tongue every 5 (five) minutes as needed for chest pain. 20 tablet 0 6 months ago  . Oxycodone HCl 10 MG TABS Take 10 mg by mouth every 6 (six) hours.   12/26/2015  . tiZANidine (ZANAFLEX) 2 MG tablet Take 2 mg by mouth 2 (two) times daily.    unknown  . meloxicam (MOBIC) 7.5 MG tablet Take 1 tablet (7.5 mg total) by mouth daily. (Patient not taking: Reported on 12/26/2015) 14 tablet 0 Not Taking    Musculoskeletal: Strength & Muscle Tone: within normal limits Gait & Station: normal Patient leans: N/A  Psychiatric Specialty Exam: Physical Exam  Nursing note and vitals reviewed. Constitutional:  I concur with PE done in ED.    Review of Systems  Psychiatric/Behavioral: Positive for depression, hallucinations and substance abuse. The patient is nervous/anxious.   All other systems reviewed and are negative.   Blood pressure 97/67, pulse 84, temperature 98.1 F (36.7 C), temperature source Oral, resp. rate 18, height 6' 0.5" (1.842 m), weight 100.5 kg (221 lb 8 oz), SpO2 97 %.Body mass index is 29.63 kg/m.  General Appearance: Casual  Eye Contact:  Fair  Speech:  Pressured  Volume:  Decreased  Mood:  Anxious  Affect:  Congruent  Thought Process:  Goal Directed and Descriptions of Associations: Circumstantial  Orientation:  Full (Time, Place, and Person)  Thought Content:  Rumination  Suicidal Thoughts:  No  Homicidal Thoughts:  No  Memory:  Immediate;   Fair Recent;   Fair Remote;   Fair  Judgement:  Impaired  Insight:  Shallow  Psychomotor Activity:  Restlessness  Concentration:  Concentration: Poor and Attention Span: Fair  Recall:  [employer] of Knowledge:  Fair  Language:  Fair  Akathisia:  No  Handed:  Right  AIMS (if indicated):     Assets:  Desire for Improvement Social Support  ADL's:  Intact  Cognition:  WNL  Sleep:  Number of Hours: 6.75       Treatment Plan Summary: Gifford is a 50 year old caucasian male who is separated , lives in [place] by self , presented  involuntarily from [place] unit, where he was initially admitted for Rhabdomyolysis , Acute liver injury and renal failure  - possible reaction to his medications - paravachol or SSRI/SNRI. Pt was medically cleared  and transported to [facility].   Daily contact with patient to assess and evaluate symptoms and progress in treatment and Medication management   Patient will benefit from inpatient treatment and stabilization.  Estimated length of stay is 5-7 days.  Reviewed past medical records,treatment plan.  Will continue Zyprexa 5 mg po qhs for psychosis/mood sx. Will not restart Prozac at this time. Will hold neurontin 600 mg po tid for anxiety sx/pain- until labs are back. Will repeat CMP,CK, get TSH , lipid panel, hba1c , as well as EKG for qtc if not already done. Will restart home medications where indicated. Will continue to monitor vitals ,medication compliance and treatment side effects while patient is here.  Will monitor for medical issues as well as call consult as needed.  CSW will start working on disposition.  Please see above for collateral information obtained from wife. Patient to participate in therapeutic milieu .      Observation Level/Precautions:  15 minute checks    Psychotherapy:  Individual and group therapy     Consultations:  Social worker  Discharge Concerns:  Stability and safety       I certify that inpatient services furnished can reasonably be expected to improve the patient's condition.    Murice Barbar, MD 7/26/20172:16 PM

## 2015-12-26 NOTE — Tx Team (Signed)
Interdisciplinary Treatment Plan Update (Adult)  Date:  12/26/2015   Time Reviewed:  11:18 AM   Progress in Treatment: Attending groups: Yes. Participating in groups:  Yes. Taking medication as prescribed:  Yes. Tolerating medication:  Yes. Family/Significant other contact made:  Yes Patient understands diagnosis:  Yes  As evidenced by seeking help with "getting back on track" Discussing patient identified problems/goals with staff:  Yes, see initial care plan. Medical problems stabilized or resolved:  Yes. Denies suicidal/homicidal ideation: Yes. Issues/concerns per patient self-inventory:  No. Other:  New problem(s) identified:  Discharge Plan or Barriers: see below  Reason for Continuation of Hospitalization: Depression Hallucinations Mania Medication stabilization  Comments: [name] is a 50 year old male being admitted involuntarily from [place] unit.  He has history of bipolar disorder with recent med changes.  He was found crushing cocaine and motrin then snorting as well as drinking windex because he thought it was blue koolaid.  He was agresive in the ICU requiring restraints but is now cooperative.  He has been medically cleared and currently in a depressed state.  RN reported that he has a history of suicidal ideation after a manic episode and his family wants him to be hospitalized to keep this from happening.  Zyprexa, Neurontin, Prozac trial  Estimated length of stay: 3-5 days  New goal(s):  Review of initial/current patient goals per problem list:   Review of initial/current patient goals per problem list:  1. Goal(s): Patient will participate in aftercare plan   Met: Yes   Target date: 3-5 days post admission date   As evidenced by: Patient will participate within aftercare plan AEB aftercare provider and housing plan at discharge being identified. 12/26/15:  Return home, follow up outpt   2. Goal (s): Patient will exhibit decreased  depressive symptoms and suicidal ideations.   Met: No   Target date: 3-5 days post admission date   As evidenced by: Patient will utilize self rating of depression at 3 or below and demonstrate decreased signs of depression or be deemed stable for discharge by MD. 12/26/15:  Rates depression a 5 today    5. Goal(s): Patient will demonstrate decreased signs of psychosis  * Met: Yes  * Target date: 3-5 days post admission date  * As evidenced by: Patient will demonstrate decreased frequency of AVH or return to baseline function 12/26/15: No signs nor symptoms of psychosis today    6. Goal (s): Patient will demonstrate decreased signs of mania  * Met: No  * Target date: 3-5 days post admission date  * As evidenced by: Patient demonstrate decreased signs of mania AEB decreased mood instability and return to baseline functioning 12/26/15:  Pt was manic prior to admission.  Willing to take medication for symptoms.     Attendees: Patient:  12/26/2015 11:18 AM   Family:   12/26/2015 11:18 AM   Physician:  Ursula Alert, MD 12/26/2015 11:18 AM   Nursing:   Phillis Haggis, RN 12/26/2015 11:18 AM   CSW:    Roque Lias, LCSW   12/26/2015 11:18 AM   Other:  12/26/2015 11:18 AM   Other:   12/26/2015 11:18 AM   Other:  Lars Pinks, Nurse CM 12/26/2015 11:18 AM   Other:   12/26/2015 11:18 AM   Other:  Norberto Sorenson, [place]  12/26/2015 11:18 AM   Other:  12/26/2015 11:18 AM   Other:  12/26/2015 11:18 AM   Other:  12/26/2015 11:18 AM   Other:  12/26/2015  11:18 AM   Other:  12/26/2015 11:18 AM   Other:   12/26/2015 11:18 AM    Scribe for Treatment Team:   Trish Mage, 12/26/2015 11:18 AM

## 2015-12-26 NOTE — B[facility] Suicide Risk Assessment (Signed)
[place] INPATIENT:  Family/Significant Other Suicide Prevention Education  Suicide Prevention Education:  Education Completed; No one has been identified by the patient as the family member/significant other with whom the patient will be residing, and identified as the person(s) who will aid the patient in the event of a mental health crisis (suicidal ideations/suicide attempt).  With written consent from the patient, the family member/significant other has been provided the following suicide prevention education, prior to the and/or following the discharge of the patient.  The suicide prevention education provided includes the following:  Suicide risk factors  Suicide prevention and interventions  National Suicide Hotline telephone number  [facility] assessment telephone number  [facility] Emergency Assistance [place] and/or Residential Mobile Crisis Unit telephone number  Request made of family/significant other to:  Remove weapons (e.g., guns, rifles, knives), all items previously/currently identified as safety concern.    Remove drugs/medications (over-the-counter, prescriptions, illicit drugs), all items previously/currently identified as a safety concern.  The family member/significant other verbalizes understanding of the suicide prevention education information provided.  The family member/significant other agrees to remove the items of safety concern listed above. The patient did not endorse SI at the time of admission, nor did the patient c/o SI during the stay here.  SPE not required.   Roque Lias B 12/26/2015, 2:58 PM

## 2015-12-26 NOTE — Progress Notes (Signed)
D: Pt denies SI/HI/AVH. Pt is pleasant and cooperative. Pt only focus seems to be on pain medication. Pt forwards little to Probation officer.   A: Pt was offered support and encouragement. Pt was given scheduled medications. Pt was encourage to attend groups. Q 15 minute checks were done for safety.   R:Pt attends groups and interacts well with peers and staff. Pt is taking medication. Pt has no complaints.Pt receptive to treatment and safety maintained on unit.

## 2015-12-26 NOTE — BHH Counselor (Signed)
Adult Comprehensive Assessment  Patient ID: [name], male   DOB: [birth year], 50 y.o.   MRN: [id number]  Information Source: Information source: Patient  Current Stressors:  Employment / Job issues: Engineer, building services / Lack of resources (include bankruptcy): Fixed income Physical health (include injuries & life threatening diseases): Chronic back pain Substance abuse: Denies  Living/Environment/Situation:  Living Arrangements: Alone Living conditions (as described by patient or guardian): apartment in [place] How long has patient lived in current situation?: 5 years What is atmosphere in current home: Comfortable, Supportive  Family History:  Marital status: Married Are you sexually active?: No What is your sexual orientation?: hetero Does patient have children?: Yes How many children?: 1 How is patient's relationship with their children?: [employer] in nursing  Childhood History:  By whom was/is the patient raised?: Both parents Description of patient's relationship with caregiver when they were a child: not close with either Patient's description of current relationship with people who raised him/her: Parents both died 2 years Does patient have siblings?: Yes Number of Siblings: 1 Description of patient's current relationship with siblings: brother died of septic shock same year parents died.-"He was a [place]" Did patient suffer any verbal/emotional/physical/sexual abuse as a child?: No Did patient suffer from severe childhood neglect?: No Has patient ever been sexually abused/assaulted/raped as an adolescent or adult?: No Was the patient ever a victim of a crime or a disaster?: No Witnessed domestic violence?: No Has patient been effected by domestic violence as an adult?: No  Education:  Highest grade of school patient has completed: 98, some college Currently a Ship broker?: No Learning disability?: No  Employment/Work Situation:   Employment situation: On  disability Why is patient on disability: Medical and mental health How long has patient been on disability: 5 years What is the longest time patient has a held a job?: 6 years 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) Has patient ever served in Recruitment consultant?: No Did You Receive Any Psychiatric Treatment/Services While in Passenger transport manager?: No Are There Guns or Other Weapons in [place]?: No  Financial Resources:   Museum/gallery curator resources: Receives SSI Does patient have a Programmer, applications or guardian?: No  Alcohol/Substance Abuse:   What has been your use of drugs/alcohol within the last 12 months?: Denies Alcohol/Substance Abuse Treatment Hx: Denies past history Has alcohol/substance abuse ever caused legal problems?: No  Social Support System:   Pensions consultant Support System: Good Describe Community Support System: Wife and daughter Type of faith/religion: N/A How does patient's faith help to cope with current illness?: N/A  Leisure/Recreation:   Leisure and Hobbies: Watch old movies  Strengths/Needs:   What things does the patient do well?: Used to read, but my eyes are going bad In what areas does patient struggle / problems for patient: Keeping all meds straight-"Sometimes I like my pain medication too much, so my wife gives me one day at a time worth."  Discharge Plan:   Does patient have access to transportation?: Yes Will patient be returning to same living situation after discharge?: Yes Currently receiving community mental health services: Yes (From Whom) ([facility]) Does patient have financial barriers related to discharge medications?: No  Summary/Recommendations:   Summary and Recommendations (to be completed by the evaluator): [name] is a 50 YO Caucasian male diagnosed with Bipolar D/O, mixed, severe, with psychosis.  He comes to [place] with VH and AH, and atributes this to  the fact that he became symptomatic not long ago, and his Dr switched him from [place] to [place].  As a result, "the wheels came off."  Prior to admission, he used cocaine, but dies this is an issue for him, and states that not only does he not remember using the cocaine, but he has 5 days or so of which he has no memory.  He will return home and follow up with [facility].  He can benefit from crises stabilization, medicaiton management, therapeutic milieu and referral for services.  Roque Lias B. 12/26/2015

## 2015-12-26 NOTE — BHH Group Notes (Signed)
Patient attend group. His day was 8. He meet his goal. He lives by himself. He need to get used to being around other people. He need to make sure he continue taking his med's. The med's are a help to him.

## 2015-12-27 LAB — LIPID PANEL
CHOL/HDL RATIO: 6.6 ratio
Cholesterol: 244 mg/dL — ABNORMAL HIGH (ref 0–200)
HDL: 37 mg/dL — AB (ref >40)
LDL CALC: 176 mg/dL — AB (ref 0–99)
TRIGLYCERIDES: 156 mg/dL — AB (ref <150)
VLDL: 31 mg/dL (ref 0–40)

## 2015-12-27 LAB — CK: CK TOTAL: 228 U/L (ref 49–397)

## 2015-12-27 LAB — COMPREHENSIVE METABOLIC PANEL
ALT: 96 U/L — ABNORMAL HIGH (ref 17–63)
ANION GAP: 10 (ref 5–15)
AST: 60 U/L — ABNORMAL HIGH (ref 15–41)
Albumin: 4.1 g/dL (ref 3.5–5.0)
Alkaline Phosphatase: 72 U/L (ref 38–126)
BUN: 18 mg/dL (ref 6–20)
CHLORIDE: 105 mmol/L (ref 101–111)
CO2: 22 mmol/L (ref 22–32)
CREATININE: 1.04 mg/dL (ref 0.61–1.24)
Calcium: 9.4 mg/dL (ref 8.9–10.3)
Glucose, Bld: 90 mg/dL (ref 65–99)
POTASSIUM: 3.9 mmol/L (ref 3.5–5.1)
Sodium: 137 mmol/L (ref 135–145)
Total Bilirubin: 0.3 mg/dL (ref 0.3–1.2)
Total Protein: 7.5 g/dL (ref 6.5–8.1)

## 2015-12-27 LAB — HEMOGLOBIN A1C
Hgb A1c MFr Bld: 5.2 % (ref 4.8–5.6)
Mean Plasma Glucose: 103 mg/dL

## 2015-12-27 LAB — TSH: TSH: 4.131 u[IU]/mL (ref 0.350–4.500)

## 2015-12-27 MED ORDER — IBUPROFEN 200 MG PO TABS: 200.0000 mg | ORAL_TABLET | Freq: Four times a day (QID) | ORAL | Status: DC | PRN

## 2015-12-27 MED ORDER — LAMOTRIGINE 25 MG PO TABS
25.0000 mg | ORAL_TABLET | Freq: Every day | ORAL | Status: DC
  Administered 2015-12-27 – 2015-12-31 (×5): 25 mg via ORAL
  Filled 2015-12-27 (×8): qty 1

## 2015-12-27 MED ORDER — GABAPENTIN 300 MG PO CAPS
300.0000 mg | ORAL_CAPSULE | Freq: Three times a day (TID) | ORAL | Status: DC
  Administered 2015-12-27 – 2015-12-31 (×13): 300 mg via ORAL
  Filled 2015-12-27 (×20): qty 1

## 2015-12-27 MED ORDER — LIDOCAINE 5 % EX PTCH
1.0000 | MEDICATED_PATCH | CUTANEOUS | Status: DC
  Administered 2015-12-27 – 2015-12-30 (×4): 1 via TRANSDERMAL
  Filled 2015-12-27 (×6): qty 1

## 2015-12-27 NOTE — Progress Notes (Signed)
D: Pt presents with depressed mood and affect. Denies SI, HI and AVH. Cooperative with unit routines. Pt's focus is primarily on pain medications and d/c, "I feel out of my element here, I'm not use to being around lot of people". Reported being light headed this evening, vitals assessed BP:  96/57 HR 50. A: Scheduled and PRN medications administered as prescribed.Support and availability provided to pt. Fluids (Gatorade, water) encouraged and tolerated well. Verbal education done on safety related to falls in regards to being light headed. Evening dose of  Lopressor 25 mg held due to low vitals, will recheck vitals. Safety maintained on Q 15 minutes checks as ordered without incident.  R: Pt receptive to care. Attends groups. Compliant with medications. Remains safe on and off unit.

## 2015-12-27 NOTE — BHH Group Notes (Signed)
[facility] LCSW Group Therapy  12/27/2015 6:12 PM  Type of Therapy:  Group Therapy  Participation Level:  Quiet, limited  Participation Quality:  Attentive  Affect:  Appropriate  Cognitive:  Appropriate  Insight:  Developing/Improving  Engagement in Therapy:  Developing/Improving  Modes of Intervention:  Discussion, Exploration, Socialization and Support  Summary of Progress/Problems:  Finding Balance in Life. Today's group focused on defining balance in one's own words, identifying things that can knock one off balance, and exploring healthy ways to maintain balance in life. Group members were asked to provide an example of a time when they felt off balance, describe how they handled that situation, and process healthier ways to regain balance in the future. Group members were asked to share the most important tool for maintaining balance that they learned while at [facility] and how they plan to apply this method after discharge. Described difficulty w interacting w groups as he lives alone.  Wants to have "no mood swings" and states medications are helping.  Beverely Pace 12/27/2015, 6:12 PM

## 2015-12-27 NOTE — Progress Notes (Signed)
[facility] MD Progress Note  12/27/2015 1:32 PM [name]  MRN:  [id number] Subjective:  Patient states " I am fine."  Objective:[name] is a 50 year old caucasian male who is separated , lives in [place] by self , presented  involuntarily from [place] unit, where he was initially admitted for Rhabdomyolysis , Acute liver injury and renal failure  - possible reaction to his medications - paravachol or SSRI/SNRI. Pt was medically cleared and transported to [facility]  Patient seen and chart reviewed.Discussed patient with treatment team.  Pt today seen as alert, oriented x3, reports some anxiety sx, however reports he is making progress. Per collateral obtained from wife ( see H&P), Pt was having AH as well as manic sx since the past 1 month which led to his out patient provider making changes with his medications prior to admission. Pt today seems to be improving. Per staff- pt is preoccupied with pain sx , but is compliant on medications. Denies ADRs.    Principal Problem: Bipolar disorder, curr episode mixed, severe, with psychotic features ([place]) Diagnosis:   Patient Active Problem List   Diagnosis Date Noted  . Bipolar disorder, curr episode mixed, severe, with psychotic features ([place]) [F31.64] 12/26/2015  . Cocaine use disorder, mild, abuse [F14.10] 12/26/2015  . History of rhabdomyolysis [Z87.39] 12/26/2015  . Liver injury [S36.119A] 12/26/2015  . Hx of renal failure [Z87.448] 12/26/2015  . Angina, class III ([place]) [I20.9] 08/18/2015  . Dyslipidemia, goal LDL below 70 [E78.5] 08/18/2015  . Family history of premature coronary artery disease [Z82.49] 08/18/2015  . Tobacco use disorder, continuous [Z72.0] 08/18/2015  . Tibial plateau fracture [S82.143A] 10/02/2013  . Multiple falls [R29.6] 10/02/2013  . Alteration in self-care ability [R53.81] 10/02/2013  . 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 Fracture of tibia with fibula, left, closed [S82.92XA]  10/02/2013  . Chronic back pain [M54.9, G89.29] 10/02/2013  . 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 History of suicidal ideation [Z86.59] 10/02/2013  . Postural imbalance with levoscoliosis, h/o [R29.3] 10/02/2013  . Chest pain [R07.9] 12/25/2012  . IRRITABLE BOWEL SYNDROME [K58.9] 08/09/2008  . HYPERLIPIDEMIA [E78.5] 10/28/2006  . DISORDER, BIPOLAR NOS [F31.9] 10/28/2006  . ANXIETY [F41.1] 10/28/2006  . DEPRESSION [F32.9] 10/28/2006  . PAIN, CHRONIC NEC [G89.29] 10/28/2006  . GERD [K21.9] 10/28/2006   Total Time spent with patient: 30 minutes  Past Psychiatric History: Please see H&P.   Past Medical History:  Past Medical History:  Diagnosis Date  . Anxiety   . Arthritis   . Bipolar affective disorder ([place])   . Chronic back pain   . DDD (degenerative disc disease)   . Depression   . DJD (degenerative joint disease)   . Elevated liver function tests   . Fibromyalgia   . GERD (gastroesophageal reflux disease)   . Head injury   . Hyperlipidemia   . Hypothyroidism   . IBS (irritable bowel syndrome)   . Inguinal hernia    left  . Myofascial pain syndrome   . Osteopenia   . Sleep apnea   . Tibial plateau fracture, left     Past Surgical History:  Procedure Laterality Date  . CARDIAC CATHETERIZATION N/A 08/31/2015   Procedure: Left Heart Cath and Coronary Angiography;  Surgeon: Leonie Man, MD;  Location: [place] [facility];  Service: Cardiovascular;  Laterality: N/A;  . Carotid Dopplers Bilateral 02/20/2015   [facility]: Mild, less than 39% left and right internal carotid artery stenosis. No significant plaque  burden  . CERVICAL DISC SURGERY     C5-7  . COLONOSCOPY    . COLONOSCOPY    . ESOPHAGOGASTRODUODENOSCOPY    . FASCIOTOMY Left 10/20/2013   Procedure: LEFT ANTERIOR COMPARTMENT FACSCIOTOMY;  Surgeon: Rozanna Box, MD;  Location: [place];  Service: Orthopedics;  Laterality: Left;  . INGUINAL HERNIA REPAIR Left   . Nuclear  Stress Test  03/06/2015   [facility]: Normal EKG. Low normal EF (49%) normal regional wall motion. No evidence of ischemia or infarction.  . ORIF TIBIA PLATEAU Left 10/20/2013   Procedure: OPEN REDUCTION INTERNAL FIXATION (ORIF) LEFT TIBIAL PLATEAU;  Surgeon: Rozanna Box, MD;  Location: [place];  Service: Orthopedics;  Laterality: Left;  . SEPTOPLASTY    . SPINAL CORD STIMULATOR IMPLANT    . TRANSTHORACIC ECHOCARDIOGRAM  02/20/2015   [facility]: Mild concentric LVH. EF 55-60%. Normal regional wall motion. Mild to moderate TR with no significant pulmonary hypertension.   Family History:  Family History  Problem Relation Age of Onset  . Heart disease Mother   . Hypertension Mother   . Sudden death Mother     Presumably cardiac  . Hyperlipidemia Father   . Heart attack Father 57    At least 5 MIs. Had CABG.  . Heart failure Father   . Diabetes Father   . Hypertension Father   . Kidney disease Father   . Diabetes Brother   . Lung cancer Maternal Grandmother   . Diabetes Paternal Grandmother   . Sudden death Paternal Grandmother     Unclear etiology  . Diabetes    . Kidney disease    . Kidney disease Brother   . Hypertension Brother   . Sudden death Brother     Thought to be related to heart disease  . Throat cancer Brother   . Colon cancer Neg Hx    Family Psychiatric  History: Please see H&P.  Social History: Please see H&P.  History  Alcohol Use No     History  Drug Use No    Social History   Social History  . Marital status: Married    Spouse name: N/A  . Number of children: 1  . Years of education: N/A   Occupational History  . disabled    Social History Main Topics  . Smoking status: Current Some Day Smoker    Packs/day: 1.00    Years: 33.00    Types: Cigarettes  . Smokeless tobacco: Former Systems developer    Quit date: 02/08/2015  . Alcohol use No  . Drug use: No  . Sexual activity: Not Asked   Other Topics Concern  . None   Social  History Narrative   He is a separated father of 1. His daughter who is in nursing school lives with him part-time.   He is currently disabled due to chronic back pain.    He did some college education.   He currently is trying to use electronic cigarettes but has smoked one pack a day for over 35 years.   He has recently stopped using prescription benzodiazepine and pain medications because he is in the process of changing pain med doctors.   He does not routinely exercise secondary to back, neck pain as well as dyspnea.   Additional Social History:    Pain Medications: See PTA Prescriptions: See PTA Over the Counter: See PTA  History of alcohol / drug use?: Yes Negative Consequences of Use: Personal relationships Withdrawal Symptoms:  Other (Comment) (None reported) Name of Substance 1: cocaine 1 - Age of First Use: unknown 1 - Amount (size/oz): unknown 1 - Frequency: once 1 - Duration: unknown 1 - Last Use / Amount: "I don't remember"                  Sleep: Fair  Appetite:  Fair  Current Medications: Current Facility-Administered Medications  Medication Dose Route Frequency Provider Last Rate Last Dose  . alum & mag hydroxide-simeth (MAALOX/MYLANTA) 200-200-20 MG/5ML suspension 30 mL  30 mL Oral Q4H PRN Niel Hum[place], NP      . aspirin chewable tablet 81 mg  81 mg Oral Daily Niel Hum[place], NP   81 mg at 12/27/15 0758  . gabapentin (NEURONTIN) capsule 300 mg  300 mg Oral TID Ursula Alert, MD   300 mg at 12/27/15 1304  . hydrOXYzine (ATARAX/VISTARIL) tablet 50 mg  50 mg Oral Q6H PRN Niel Hum[place], NP   50 mg at 12/26/15 2253  . ibuprofen (ADVIL,MOTRIN) tablet 200 mg  200 mg Oral Q6H PRN Ursula Alert, MD      . lamoTRIgine (LAMICTAL) tablet 25 mg  25 mg Oral Daily Stonewall Doss, MD      . levothyroxine (SYNTHROID, LEVOTHROID) tablet 75 mcg  75 mcg Oral QAC breakfast Niel Hummer, NP   75 mcg at 12/27/15 0254  . lidocaine (LIDODERM) 5 % 1 patch  1 patch Transdermal  Q24H Akif Weldy, MD      . magnesium hydroxide (MILK OF MAGNESIA) suspension 30 mL  30 mL Oral Daily PRN Niel Hummer, NP      . metoprolol tartrate (LOPRESSOR) tablet 25 mg  25 mg Oral BID Niel Hummer, NP   25 mg at 12/27/15 0758  . nicotine (NICODERM CQ - dosed in mg/24 hours) patch 21 mg  21 mg Transdermal Daily Ursula Alert, MD   21 mg at 12/26/15 0803  . nitroGLYCERIN (NITROSTAT) SL tablet 0.4 mg  0.4 mg Sublingual Q5 min PRN Calahan Pak, MD      . OLANZapine zydis (ZYPREXA) disintegrating tablet 5 mg  5 mg Oral BID PRN Ursula Alert, MD       Or  . OLANZapine (ZYPREXA) injection 5 mg  5 mg Intramuscular BID PRN Ursula Alert, MD      . OLANZapine zydis (ZYPREXA) disintegrating tablet 5 mg  5 mg Oral QHS Ursula Alert, MD   5 mg at 12/26/15 2253  . oxyCODONE (Oxy IR/ROXICODONE) immediate release tablet 10 mg  10 mg Oral Q6H PRN Niel Hummer, NP   10 mg at 12/27/15 1304  . pantoprazole (PROTONIX) EC tablet 40 mg  40 mg Oral BID AC Ursula Alert, MD   40 mg at 12/27/15 2706  . tiZANidine (ZANAFLEX) tablet 2 mg  2 mg Oral Q8H PRN Ursula Alert, MD        Lab Results:  Results for orders placed or performed during the hospital encounter of 12/25/15 (from the past 48 hour(s))  Hemoglobin A1c     Status: None   Collection Time: 12/26/15  6:34 PM  Result Value Ref Range   Hgb A1c MFr Bld 5.2 4.8 - 5.6 %    Comment: (NOTE)         Pre-diabetes: 5.7 - 6.4         Diabetes: >6.4         Glycemic control for adults with diabetes: <7.0    Mean Plasma Glucose 103  mg/dL    Comment: (NOTE) Performed At: [facility] [place], [place] [id number] Lindon Romp MD [id number] Performed at [facility]   TSH     Status: None   Collection Time: 12/27/15  6:59 AM  Result Value Ref Range   TSH 4.131 0.350 - 4.500 uIU/mL    Comment: Performed at [facility]  Lipid panel     Status: Abnormal   Collection Time:  12/27/15  6:59 AM  Result Value Ref Range   Cholesterol 244 (H) 0 - 200 mg/dL   Triglycerides 156 (H) <150 mg/dL   HDL 37 (L) >40 mg/dL   Total CHOL/HDL Ratio 6.6 RATIO   VLDL 31 0 - 40 mg/dL   LDL Cholesterol 176 (H) 0 - 99 mg/dL    Comment:        Total Cholesterol/HDL:CHD Risk Coronary Heart Disease Risk Table                     Men   Women  1/2 Average Risk   3.4   3.3  Average Risk       5.0   4.4  2 X Average Risk   9.6   7.1  3 X Average Risk  23.4   11.0        Use the calculated Patient Ratio above and the CHD Risk Table to determine the patient's CHD Risk.        ATP III CLASSIFICATION (LDL):  <100     mg/dL   Optimal  100-129  mg/dL   Near or Above                    Optimal  130-159  mg/dL   Borderline  160-189  mg/dL   High  >190     mg/dL   Very High Performed at [facility]   Comprehensive metabolic panel     Status: Abnormal   Collection Time: 12/27/15  6:59 AM  Result Value Ref Range   Sodium 137 135 - 145 mmol/L   Potassium 3.9 3.5 - 5.1 mmol/L   Chloride 105 101 - 111 mmol/L   CO2 22 22 - 32 mmol/L   Glucose, Bld 90 65 - 99 mg/dL   BUN 18 6 - 20 mg/dL   Creatinine, Ser 1.04 0.61 - 1.24 mg/dL   Calcium 9.4 8.9 - 10.3 mg/dL   Total Protein 7.5 6.5 - 8.1 g/dL   Albumin 4.1 3.5 - 5.0 g/dL   AST 60 (H) 15 - 41 U/L   ALT 96 (H) 17 - 63 U/L   Alkaline Phosphatase 72 38 - 126 U/L   Total Bilirubin 0.3 0.3 - 1.2 mg/dL   GFR calc non Af Amer >60 >60 mL/min   GFR calc Af Amer >60 >60 mL/min    Comment: (NOTE) The eGFR has been calculated using the CKD EPI equation. This calculation has not been validated in all clinical situations. eGFR's persistently <60 mL/min signify possible Chronic Kidney Disease.    Anion gap 10 5 - 15    Comment: Performed at [facility]  CK     Status: None   Collection Time: 12/27/15  6:59 AM  Result Value Ref Range   Total CK 228 49 - 397 U/L    Comment: Performed at [facility]    Blood Alcohol level:  Lab Results  Component Value Date  ETH <11 10/02/2013   ETH <11 78/67/6720    Metabolic Disorder Labs: Lab Results  Component Value Date   HGBA1C 5.2 12/26/2015   MPG 103 12/26/2015   MPG 100 12/26/2012   No results found for: PROLACTIN Lab Results  Component Value Date   CHOL 244 (H) 12/27/2015   TRIG 156 (H) 12/27/2015   HDL 37 (L) 12/27/2015   CHOLHDL 6.6 12/27/2015   VLDL 31 12/27/2015   LDLCALC 176 (H) 12/27/2015   LDLCALC 100 (H) 12/26/2012    Physical Findings: AIMS: Facial and Oral Movements Muscles of Facial Expression: None, normal Lips and Perioral Area: None, normal Jaw: None, normal Tongue: None, normal,Extremity Movements Upper (arms, wrists, hands, fingers): None, normal Lower (legs, knees, ankles, toes): None, normal, Trunk Movements Neck, shoulders, hips: None, normal, Overall Severity Severity of abnormal movements (highest score from questions above): None, normal Incapacitation due to abnormal movements: None, normal Patient's awareness of abnormal movements (rate only patient's report): No Awareness, Dental Status Current problems with teeth and/or dentures?: No Does patient usually wear dentures?: No  CIWA:    COWS:     Musculoskeletal: Strength & Muscle Tone: within normal limits Gait & Station: normal Patient leans: N/A  Psychiatric Specialty Exam: Physical Exam  Nursing note and vitals reviewed.   Review of Systems  Musculoskeletal: Positive for back pain.  Psychiatric/Behavioral: The patient is nervous/anxious.   All other systems reviewed and are negative.   Blood pressure 105/65, pulse 68, temperature 97.7 F (36.5 C), temperature source Oral, resp. rate 18, height 6' 0.5" (1.842 m), weight 100.5 kg (221 lb 8 oz), SpO2 97 %.Body mass index is 29.63 kg/m.  General Appearance: Fairly Groomed  Eye Contact:  Fair  Speech:  Clear and Coherent  Volume:  Normal  Mood:  Anxious  Affect:   Appropriate  Thought Process:  Goal Directed and Descriptions of Associations: Circumstantial  Orientation:  Full (Time, Place, and Person)  Thought Content:  Rumination  Suicidal Thoughts:  No  Homicidal Thoughts:  No  Memory:  Immediate;   Fair Recent;   Fair Remote;   Fair  Judgement:  Impaired  Insight:  Shallow  Psychomotor Activity:  Normal  Concentration:  Concentration: Fair and Attention Span: Fair  Recall:  [employer] of Knowledge:  Fair  Language:  Fair  Akathisia:  No  Handed:  Right  AIMS (if indicated):     Assets:  Desire for Improvement  ADL's:  Intact  Cognition:  WNL  Sleep:  Number of Hours: 4.75     Treatment Plan Summary:Yan is a 50 year old caucasian male who is separated , lives in [place] by self , presented  involuntarily from [facility] medical unit, where he was initially admitted for Rhabdomyolysis , Acute liver injury and renal failure  - possible reaction to his medications - paravachol or SSRI/SNRI. Pt was medically cleared and transported to [facility].Patient continues to make progress , is less restless, will continue treatment.   Daily contact with patient to assess and evaluate symptoms and progress in treatment and Medication management Will continue Zyprexa 5 mg po qhs for psychosis/mood sx. Will not restart Prozac at this time. Will start Lamictal 25 mg po daily for mood sx. Will restart neurontin at 300 mg po tid for anxiety sx/pain- until labs are back. Repeat CMP,CK- WNL ,  TSH -wnl , lipid panel- abnormal - will get dietician consult , hba1c- wnl . Will restart home medications where indicated. Will continue to  monitor vitals ,medication compliance and treatment side effects while patient is here.  Will monitor for medical issues as well as call consult as needed.  CSW will continue working on disposition.  Please see H&P for collateral information obtained from wife. Patient to participate in therapeutic milieu .      Desirea Mizrahi, MD 12/27/2015, 1:32 PM

## 2015-12-27 NOTE — Progress Notes (Signed)
Patient attended karaoke group this evening.

## 2015-12-27 NOTE — Progress Notes (Signed)
D: Pt denies SI/HI/AVH. Pt is pleasant and cooperative. Pt only focus is on his pain medications. Pt doesn't want to talk unless it's about his pain medication and when he can get the next one.  A: Pt was offered support and encouragement. Pt was given scheduled medications. Pt was encourage to attend groups. Q 15 minute checks were done for safety.   R: Pt is taking medication. Pt has no complaints.Pt receptive to treatment and safety maintained on unit.

## 2015-12-28 NOTE — Progress Notes (Signed)
[facility] MD Progress Note  12/28/2015 10:18 AM [name]  MRN:  [id number] Subjective:  Patient states " I am ok."  Objective:[name] is a 50 year old caucasian male who is separated , lives in [place] by self , presented  involuntarily from [facility] unit, where he was initially admitted for Rhabdomyolysis , Acute liver injury and renal failure  - possible reaction to his medications - paravachol or SSRI/SNRI. Pt was medically cleared and transported to [facility].  Patient seen and chart reviewed.Discussed patient with treatment team.  Pt today seen as alert, oriented x3. Pt continues to be restless at times , although progressing. Per staff- pt is compliant on medications, is preoccupied with pain sx . Denies ADRs. Pt continues to need encouragement and support.   Principal Problem: Bipolar disorder, curr episode mixed, severe, with psychotic features ([place]) Diagnosis:   Patient Active Problem List   Diagnosis Date Noted  . Bipolar disorder, curr episode mixed, severe, with psychotic features ([place]) [F31.64] 12/26/2015  . Cocaine use disorder, mild, abuse [F14.10] 12/26/2015  . History of rhabdomyolysis [Z87.39] 12/26/2015  . Liver injury [S36.119A] 12/26/2015  . Hx of renal failure [Z87.448] 12/26/2015  . Angina, class III ([place]) [I20.9] 08/18/2015  . Dyslipidemia, goal LDL below 70 [E78.5] 08/18/2015  . Family history of premature coronary artery disease [Z82.49] 08/18/2015  . Tobacco use disorder, continuous [Z72.0] 08/18/2015  . Tibial plateau fracture [S82.143A] 10/02/2013  . Multiple falls [R29.6] 10/02/2013  . Alteration in self-care ability [R53.81] 10/02/2013  . 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 Fracture of tibia with fibula, left, closed [S82.92XA] 10/02/2013  . Chronic back pain [M54.9, G89.29] 10/02/2013  . 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 History of suicidal ideation [Z86.59] 10/02/2013  . Postural imbalance with  levoscoliosis, h/o [R29.3] 10/02/2013  . Chest pain [R07.9] 12/25/2012  . IRRITABLE BOWEL SYNDROME [K58.9] 08/09/2008  . HYPERLIPIDEMIA [E78.5] 10/28/2006  . DISORDER, BIPOLAR NOS [F31.9] 10/28/2006  . ANXIETY [F41.1] 10/28/2006  . DEPRESSION [F32.9] 10/28/2006  . PAIN, CHRONIC NEC [G89.29] 10/28/2006  . GERD [K21.9] 10/28/2006   Total Time spent with patient: 25 minutes  Past Psychiatric History: Please see H&P.   Past Medical History:  Past Medical History:  Diagnosis Date  . Anxiety   . Arthritis   . Bipolar affective disorder ([place])   . Chronic back pain   . DDD (degenerative disc disease)   . Depression   . DJD (degenerative joint disease)   . Elevated liver function tests   . Fibromyalgia   . GERD (gastroesophageal reflux disease)   . Head injury   . Hyperlipidemia   . Hypothyroidism   . IBS (irritable bowel syndrome)   . Inguinal hernia    left  . Myofascial pain syndrome   . Osteopenia   . Sleep apnea   . Tibial plateau fracture, left     Past Surgical History:  Procedure Laterality Date  . CARDIAC CATHETERIZATION N/A 08/31/2015   Procedure: Left Heart Cath and Coronary Angiography;  Surgeon: Leonie Man, MD;  Location: [place] [facility];  Service: Cardiovascular;  Laterality: N/A;  . Carotid Dopplers Bilateral 02/20/2015   [facility]: Mild, less than 39% left and right internal carotid artery stenosis. No significant plaque burden  . CERVICAL DISC SURGERY     C5-7  . COLONOSCOPY    . COLONOSCOPY    . ESOPHAGOGASTRODUODENOSCOPY    . FASCIOTOMY Left 10/20/2013   Procedure: LEFT ANTERIOR COMPARTMENT FACSCIOTOMY;  Surgeon: Rozanna Box, MD;  Location: [place];  Service: Orthopedics;  Laterality: Left;  . INGUINAL HERNIA REPAIR Left   . Nuclear Stress Test  03/06/2015   [facility]: Normal EKG. Low normal EF (49%) normal regional wall motion. No evidence of ischemia or infarction.  . ORIF TIBIA PLATEAU Left 10/20/2013   Procedure:  OPEN REDUCTION INTERNAL FIXATION (ORIF) LEFT TIBIAL PLATEAU;  Surgeon: Rozanna Box, MD;  Location: [place];  Service: Orthopedics;  Laterality: Left;  . SEPTOPLASTY    . SPINAL CORD STIMULATOR IMPLANT    . TRANSTHORACIC ECHOCARDIOGRAM  02/20/2015   [facility]: Mild concentric LVH. EF 55-60%. Normal regional wall motion. Mild to moderate TR with no significant pulmonary hypertension.   Family History:  Family History  Problem Relation Age of Onset  . Heart disease Mother   . Hypertension Mother   . Sudden death Mother     Presumably cardiac  . Hyperlipidemia Father   . Heart attack Father 35    At least 5 MIs. Had CABG.  . Heart failure Father   . Diabetes Father   . Hypertension Father   . Kidney disease Father   . Diabetes Brother   . Lung cancer Maternal Grandmother   . Diabetes Paternal Grandmother   . Sudden death Paternal Grandmother     Unclear etiology  . Diabetes    . Kidney disease    . Kidney disease Brother   . Hypertension Brother   . Sudden death Brother     Thought to be related to heart disease  . Throat cancer Brother   . Colon cancer Neg Hx    Family Psychiatric  History: Please see H&P.  Social History: Please see H&P.  History  Alcohol Use No     History  Drug Use No    Social History   Social History  . Marital status: Married    Spouse name: N/A  . Number of children: 1  . Years of education: N/A   Occupational History  . disabled    Social History Main Topics  . Smoking status: Current Some Day Smoker    Packs/day: 1.00    Years: 33.00    Types: Cigarettes  . Smokeless tobacco: Former Systems developer    Quit date: 02/08/2015  . Alcohol use No  . Drug use: No  . Sexual activity: Not Asked   Other Topics Concern  . None   Social History Narrative   He is a separated father of 1. His daughter who is in nursing school lives with him part-time.   He is currently disabled due to chronic back pain.    He did some college  education.   He currently is trying to use electronic cigarettes but has smoked one pack a day for over 35 years.   He has recently stopped using prescription benzodiazepine and pain medications because he is in the process of changing pain med doctors.   He does not routinely exercise secondary to back, neck pain as well as dyspnea.   Additional Social History:    Pain Medications: See PTA Prescriptions: See PTA Over the Counter: See PTA  History of alcohol / drug use?: Yes Negative Consequences of Use: Personal relationships Withdrawal Symptoms: Other (Comment) (None reported) Name of Substance 1: cocaine 1 - Age of First Use: unknown 1 - Amount (size/oz): unknown 1 - Frequency: once 1 - Duration: unknown 1 - Last Use / Amount: "I don't remember"  Sleep: Fair  Appetite:  Fair  Current Medications: Current Facility-Administered Medications  Medication Dose Route Frequency Provider Last Rate Last Dose  . alum & mag hydroxide-simeth (MAALOX/MYLANTA) 200-200-20 MG/5ML suspension 30 mL  30 mL Oral Q4H PRN Niel Hummer, NP      . aspirin chewable tablet 81 mg  81 mg Oral Daily Niel Hummer, NP   81 mg at 12/28/15 0835  . gabapentin (NEURONTIN) capsule 300 mg  300 mg Oral TID Ursula Alert, MD   300 mg at 12/28/15 0835  . hydrOXYzine (ATARAX/VISTARIL) tablet 50 mg  50 mg Oral Q6H PRN Niel Hummer, NP   50 mg at 12/27/15 2127  . ibuprofen (ADVIL,MOTRIN) tablet 200 mg  200 mg Oral Q6H PRN Ursula Alert, MD      . lamoTRIgine (LAMICTAL) tablet 25 mg  25 mg Oral Daily Ursula Alert, MD   25 mg at 12/28/15 0835  . levothyroxine (SYNTHROID, LEVOTHROID) tablet 75 mcg  75 mcg Oral QAC breakfast Niel Hummer, NP   75 mcg at 12/28/15 0630  . lidocaine (LIDODERM) 5 % 1 patch  1 patch Transdermal Q24H Ursula Alert, MD   1 patch at 12/27/15 1605  . magnesium hydroxide (MILK OF MAGNESIA) suspension 30 mL  30 mL Oral Daily PRN Niel Hummer, NP      . metoprolol  tartrate (LOPRESSOR) tablet 25 mg  25 mg Oral BID Niel Hummer, NP   25 mg at 12/28/15 0835  . nicotine (NICODERM CQ - dosed in mg/24 hours) patch 21 mg  21 mg Transdermal Daily Ursula Alert, MD   21 mg at 12/28/15 0837  . nitroGLYCERIN (NITROSTAT) SL tablet 0.4 mg  0.4 mg Sublingual Q5 min PRN Kalya Troeger, MD      . OLANZapine zydis (ZYPREXA) disintegrating tablet 5 mg  5 mg Oral BID PRN Ursula Alert, MD       Or  . OLANZapine (ZYPREXA) injection 5 mg  5 mg Intramuscular BID PRN Ursula Alert, MD      . OLANZapine zydis (ZYPREXA) disintegrating tablet 5 mg  5 mg Oral QHS Ursula Alert, MD   5 mg at 12/27/15 2127  . oxyCODONE (Oxy IR/ROXICODONE) immediate release tablet 10 mg  10 mg Oral Q6H PRN Niel Hummer, NP   10 mg at 12/28/15 0616  . pantoprazole (PROTONIX) EC tablet 40 mg  40 mg Oral BID AC Bryssa Tones, MD   40 mg at 12/28/15 0630  . tiZANidine (ZANAFLEX) tablet 2 mg  2 mg Oral Q8H PRN Ursula Alert, MD        Lab Results:  Results for orders placed or performed during the hospital encounter of 12/25/15 (from the past 48 hour(s))  Hemoglobin A1c     Status: None   Collection Time: 12/26/15  6:34 PM  Result Value Ref Range   Hgb A1c MFr Bld 5.2 4.8 - 5.6 %    Comment: (NOTE)         Pre-diabetes: 5.7 - 6.4         Diabetes: >6.4         Glycemic control for adults with diabetes: <7.0    Mean Plasma Glucose 103 mg/dL    Comment: (NOTE) Performed At: [facility] [place], [place] [id number] Lindon Romp MD [id number] Performed at [facility]   TSH     Status: None   Collection Time: 12/27/15  6:59 AM  Result Value Ref  Range   TSH 4.131 0.350 - 4.500 uIU/mL    Comment: Performed at [facility]  Lipid panel     Status: Abnormal   Collection Time: 12/27/15  6:59 AM  Result Value Ref Range   Cholesterol 244 (H) 0 - 200 mg/dL   Triglycerides 156 (H) <150 mg/dL   HDL 37 (L) >40 mg/dL   Total  CHOL/HDL Ratio 6.6 RATIO   VLDL 31 0 - 40 mg/dL   LDL Cholesterol 176 (H) 0 - 99 mg/dL    Comment:        Total Cholesterol/HDL:CHD Risk Coronary Heart Disease Risk Table                     Men   Women  1/2 Average Risk   3.4   3.3  Average Risk       5.0   4.4  2 X Average Risk   9.6   7.1  3 X Average Risk  23.4   11.0        Use the calculated Patient Ratio above and the CHD Risk Table to determine the patient's CHD Risk.        ATP III CLASSIFICATION (LDL):  <100     mg/dL   Optimal  100-129  mg/dL   Near or Above                    Optimal  130-159  mg/dL   Borderline  160-189  mg/dL   High  >190     mg/dL   Very High Performed at [facility]   Comprehensive metabolic panel     Status: Abnormal   Collection Time: 12/27/15  6:59 AM  Result Value Ref Range   Sodium 137 135 - 145 mmol/L   Potassium 3.9 3.5 - 5.1 mmol/L   Chloride 105 101 - 111 mmol/L   CO2 22 22 - 32 mmol/L   Glucose, Bld 90 65 - 99 mg/dL   BUN 18 6 - 20 mg/dL   Creatinine, Ser 1.04 0.61 - 1.24 mg/dL   Calcium 9.4 8.9 - 10.3 mg/dL   Total Protein 7.5 6.5 - 8.1 g/dL   Albumin 4.1 3.5 - 5.0 g/dL   AST 60 (H) 15 - 41 U/L   ALT 96 (H) 17 - 63 U/L   Alkaline Phosphatase 72 38 - 126 U/L   Total Bilirubin 0.3 0.3 - 1.2 mg/dL   GFR calc non Af Amer >60 >60 mL/min   GFR calc Af Amer >60 >60 mL/min    Comment: (NOTE) The eGFR has been calculated using the CKD EPI equation. This calculation has not been validated in all clinical situations. eGFR's persistently <60 mL/min signify possible Chronic Kidney Disease.    Anion gap 10 5 - 15    Comment: Performed at [facility]  CK     Status: None   Collection Time: 12/27/15  6:59 AM  Result Value Ref Range   Total CK 228 49 - 397 U/L    Comment: Performed at [facility]    Blood Alcohol level:  Lab Results  Component Value Date   [facility] <11 10/02/2013   ETH <11 77/04/6578    Metabolic Disorder Labs: Lab  Results  Component Value Date   HGBA1C 5.2 12/26/2015   MPG 103 12/26/2015   MPG 100 12/26/2012   No results found for: PROLACTIN Lab Results  Component Value Date  CHOL 244 (H) 12/27/2015   TRIG 156 (H) 12/27/2015   HDL 37 (L) 12/27/2015   CHOLHDL 6.6 12/27/2015   VLDL 31 12/27/2015   LDLCALC 176 (H) 12/27/2015   LDLCALC 100 (H) 12/26/2012    Physical Findings: AIMS: Facial and Oral Movements Muscles of Facial Expression: None, normal Lips and Perioral Area: None, normal Jaw: None, normal Tongue: None, normal,Extremity Movements Upper (arms, wrists, hands, fingers): None, normal Lower (legs, knees, ankles, toes): None, normal, Trunk Movements Neck, shoulders, hips: None, normal, Overall Severity Severity of abnormal movements (highest score from questions above): None, normal Incapacitation due to abnormal movements: None, normal Patient's awareness of abnormal movements (rate only patient's report): No Awareness, Dental Status Current problems with teeth and/or dentures?: No Does patient usually wear dentures?: No  CIWA:    COWS:     Musculoskeletal: Strength & Muscle Tone: within normal limits Gait & Station: normal Patient leans: N/A  Psychiatric Specialty Exam: Physical Exam  Nursing note and vitals reviewed.   Review of Systems  Musculoskeletal: Positive for back pain.  Psychiatric/Behavioral: The patient is nervous/anxious.   All other systems reviewed and are negative.   Blood pressure 100/71, pulse 72, temperature 98 F (36.7 C), temperature source Oral, resp. rate 12, height 6' 0.5" (1.842 m), weight 100.5 kg (221 lb 8 oz), SpO2 98 %.Body mass index is 29.63 kg/m.  General Appearance: Fairly Groomed  Eye Contact:  Fair  Speech:  Clear and Coherent  Volume:  Normal  Mood:  Anxious  Affect:  Appropriate  Thought Process:  Goal Directed and Descriptions of Associations: Circumstantial  Orientation:  Full (Time, Place, and Person)  Thought Content:   Rumination  Suicidal Thoughts:  No  Homicidal Thoughts:  No  Memory:  Immediate;   Fair Recent;   Fair Remote;   Fair  Judgement:  Impaired  Insight:  Shallow  Psychomotor Activity:  Normal  Concentration:  Concentration: Fair and Attention Span: Fair  Recall:  [employer] of Knowledge:  Fair  Language:  Fair  Akathisia:  No  Handed:  Right  AIMS (if indicated):     Assets:  Desire for Improvement  ADL's:  Intact  Cognition:  WNL  Sleep:  Number of Hours: 6.5     Treatment Plan Summary:[name] is a 50 year old caucasian male who is separated , lives in [place] by self , presented  involuntarily from [facility] medical unit, where he was initially admitted for Rhabdomyolysis , Acute liver injury and renal failure  - possible reaction to his medications - paravachol or SSRI/SNRI. Pt was medically cleared and transported to [facility].Patient continues to make progress , although restless on and off , will continue treatment.   Daily contact with patient to assess and evaluate symptoms and progress in treatment and Medication management Will continue Zyprexa 5 mg po qhs for psychosis/mood sx. Will not restart Prozac at this time. Will continue Lamictal 25 mg po daily for mood sx. Restarted neurontin at 300 mg po tid for anxiety sx/pain- until labs are back. Repeat CMP,CK- WNL ,  TSH -wnl , lipid panel- abnormal - placed dietician consult , hba1c- wnl . Restarted home medications where indicated. Will continue to monitor vitals ,medication compliance and treatment side effects while patient is here.  Will monitor for medical issues as well as call consult as needed.  CSW will continue working on disposition.  Please see H&P for collateral information obtained from wife. Patient to participate in therapeutic milieu .  Travis Wadding, MD 12/28/2015, 10:18 AM

## 2015-12-28 NOTE — Progress Notes (Signed)
D: Pt denies SI/HI/AVH. Pt is pleasant and cooperative. Pt main focus is on his pain medication. Pt stated he was dealing with anxiety earlier, but was feeling better this evening.   A: Pt was offered support and encouragement. Pt was given scheduled medications. Pt was encourage to attend groups. Q 15 minute checks were done for safety.   R:Pt attends groups and interacts well with peers and staff. Pt is taking medication. .Pt receptive to treatment and safety maintained on unit.

## 2015-12-28 NOTE — BHH Group Notes (Signed)
[place] LCSW Group Therapy  12/28/2015 4:05 PM   Type of Therapy:  Group Therapy  Participation Level:  Minimal  Participation Quality:  Appropriate, Attentive, and Supportive  Affect:  Quiet, thoughtful  Cognitive:  Appropriate  Insight:  Developing/Improving  Engagement in Therapy:  Engaged  Modes of Intervention:  Discussion, Exploration, Socialization and Support  Summary of Progress/Problems:  Chaplain led group explored concept of hope and its relevance to mental health recovery.  Patients explored themes including what matters to them personally, how others responses are similar/different, and what they are hopeful for.  Group members discussed relevance of social supports, innter strength and using their own stories to craft a recovery path.  Quiet participant but engaged.  States "I dont feel anything" but then described anger towards family members who have misused family funds.  States he "pushes forward" and "struggle to keep on my set schedule", is socially isolated.  Was able to connect in limited fashion w others in group  Beverely Pace

## 2015-12-28 NOTE — Progress Notes (Signed)
DAR NOTE: Patient presents with anxious affect and depressed mood.  Denies pain, auditory and visual hallucinations.  Described energy level as normal and concentration as good.  Rates depression at 4, hopelessness at 2, and anxiety at 5.  Maintained on routine safety checks.  Medications given as prescribed.  Support and encouragement offered as needed.  Attended group and participated.  States goal for today is "going home."  Patient requested and received Oxycodone 10 mg  for complain of back, neck and left leg pain with good effect.  Patient was encouraged to come out of his room for activities and therapy.

## 2015-12-28 NOTE — Progress Notes (Signed)
Adult Psychoeducational Group Note  Date:  12/28/2015 Time:  8:56 PM  Group Topic/Focus:  Wrap-Up Group:   The focus of this group is to help patients review their daily goal of treatment and discuss progress on daily workbooks.   Participation Level:  Active  Participation Quality:  Appropriate  Affect:  Appropriate  Cognitive:  Appropriate  Insight: Appropriate  Engagement in Group:  Engaged  Modes of Intervention:  Discussion  Additional Comments: The expressed that he attended groups.The patient also said she rates today a 7. [name] 12/28/2015, 8:56 PM

## 2015-12-28 NOTE — Tx Team (Signed)
Interdisciplinary Treatment Plan Update (Adult)  Date:  12/28/2015 Time Reviewed:  10:00 AM  Progress in Treatment: Attending groups: Yes. Participating in groups:  Yes. Taking medication as prescribed:  Yes. Tolerating medication:  Yes. Family/Significant othe contact made:  Yes Patient understands diagnosis:  Yes. Discussing patient identified problems/goals with staff:  Yes. Medical problems stabilized or resolved:  Yes. Denies suicidal/homicidal ideation: Yes. Issues/concerns per patient self-inventory:  Yes. Other:  New problem(s) identified: Yes, Describe:  NA  Discharge Plan or Barriers:  Reason for Continuation of Hospitalization: Depression Hallucinations [place] Medication stabilization  Comments:  Estimated length of stay:  New goal(s):  Review of initial/current patient goals per problem list:   1.  Goal(s): Patient will participate in aftercare plan * Met:  * Target date: at discharge * As evidenced by: Patient will participate within aftercare plan AEB aftercare provider and housing plan at discharge being identified.   2.  Goal (s): Patient will exhibit decreased depressive symptoms and suicidal ideations. * Met:  *  Target date: at discharge * As evidenced by: Patient will utilize self rating of depression at 3 or below and demonstrate decreased signs of depression or be deemed stable for discharge by MD.  3.  Goal (s): Patient will demonstrate decreased symptoms of psychosis. * Met: No  *  Target date: at discharge * As evidenced by: Patient will not endorse signs of psychosis or be deemed stable for discharge by MD.    Attendees: Patient:  [name] 7/28/201710:00 AM  Family:   7/28/201710:00 AM  Physician:  Ursula Alert, MD  7/28/201710:00 AM  Nursing:    7/28/201710:00 AM  Case Manager:   7/28/201710:00 AM  Counselor:   7/28/201710:00 AM  Other:  Wray Kearns, [place] 7/28/201710:00 AM  Other:  Maxie Better, LCSW 7/28/201710:00 AM   Other:   7/28/201710:00 AM  Other:  7/28/201710:00 AM  Other:  7/28/201710:00 AM  Other:  7/28/201710:00 AM  Other:  7/28/201710:00 AM  Other:  7/28/201710:00 AM  Other:  7/28/201710:00 AM  Other:   7/28/201710:00 AM   Scribe for Treatment Team:   Wray Kearns, MSW, LCSWA  12/28/2015, 10:00 AM

## 2015-12-29 DIAGNOSIS — F3164 Bipolar disorder, current episode mixed, severe, with psychotic features: Principal | ICD-10-CM

## 2015-12-29 NOTE — Progress Notes (Signed)
Only thing pt wants is his pain medication.

## 2015-12-29 NOTE — Progress Notes (Signed)
D.  Pt pleasant on approach, had been concerned about his pain medication and was pleased to see that it is now correct.  Pt did attend evening wrap up group, and has been observed engaged in appropriate interaction with peers on unit.  Pt denies SI/HI/hallucinations at this time.  A.  Support and encouragement offered, medication given as ordered  R.  PT remains safe on the unit, will continue to monitor.

## 2015-12-29 NOTE — BHH Group Notes (Signed)
[place] Group Notes:  (Nursing/MHT/Case Management/Adjunct)  Date:  12/29/2015  Time:  6:46 PM  Type of Therapy:  Nurse Education  Participation Level:  Active  Participation Quality:  Appropriate and Attentive  Affect:  Appropriate  Cognitive:  Alert and Appropriate  Insight:  Appropriate and Good  Engagement in Group:  Engaged  Modes of Intervention:  Discussion and Education  Summary of Progress/Problems: Topic was on healthy coping skills.  Discussed different copings that works and is effective.  Group encouraged to surround self with a good support system. Support systems are there to motivate, encourage, and assist with learning new coping skills that leads to a healthy lifestyle.  Patient was attentive and receptive.    Mart Piggs 12/29/2015, 6:46 PM

## 2015-12-29 NOTE — BHH Group Notes (Signed)
[facility] Group Notes:  (Clinical Social Work)   03/31/2015     11:15AM-12:00PM  Summary of Progress/Problems:   In today's process group, patients listed one healthy and one unhealthy coping technique they utilize and there was a full discussion about more healthy ways to cope with problems and symptoms, and how to stay well out of the hospital.  Later, a list of 99 coping skills was used to discuss more ideas that they generally had not previously considered.   The patient expressed that the healthy and unhealthy coping he often uses are solitude and isolation.  He spoke little in group, did not appear to be interested in alternative coping techniques that were suggested.  Type of Therapy:  Group Therapy - Process   Participation Level:  Active  Participation Quality:  Attentive  Affect:  Blunted  Cognitive:  Alert  Insight:  Improving  Engagement in Therapy:  Engaged  Modes of Intervention:  Education, Motivational Interviewing  Selmer Dominion, LCSW 12/29/2015, 4:03 PM

## 2015-12-29 NOTE — Progress Notes (Signed)
Patient ID: CROSBY GARA, male   DOB: 1966/04/13, 50 y.o.   MRN: [id number] [facility] MD Progress Note  12/29/2015 3:25 PM ZYHEIR EASTER  MRN:  [id number] Subjective:  Patient states " I need my pain medication as my pain doctor used to give me before I came here.''  Objective:  Patient was seen, interviewed, chart reviewed and case discussed with treatment team. Patient reports that he has been doing much better on his current medication regimen. He is reporting decreased mood lability, agitation or psychosis. He denies SI but continues to verbalize vague depressive symptoms. He is preoccupied with getting adequate treatment for his back pain.  Principal Problem: Bipolar disorder, curr episode mixed, severe, with psychotic features ([place]) Diagnosis:   Patient Active Problem List   Diagnosis Date Noted  . Bipolar disorder, curr episode mixed, severe, with psychotic features ([place]) [F31.64] 12/26/2015  . Cocaine use disorder, mild, abuse [F14.10] 12/26/2015  . History of rhabdomyolysis [Z87.39] 12/26/2015  . Liver injury [S36.119A] 12/26/2015  . Hx of renal failure [Z87.448] 12/26/2015  . Angina, class III ([place]) [I20.9] 08/18/2015  . Dyslipidemia, goal LDL below 70 [E78.5] 08/18/2015  . Family history of premature coronary artery disease [Z82.49] 08/18/2015  . Tobacco use disorder, continuous [Z72.0] 08/18/2015  . Tibial plateau fracture [S82.143A] 10/02/2013  . Multiple falls [R29.6] 10/02/2013  . Alteration in self-care ability [R53.81] 10/02/2013  . 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 Fracture of tibia with fibula, left, closed [S82.92XA] 10/02/2013  . Chronic back pain [M54.9, G89.29] 10/02/2013  . 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 History of suicidal ideation [Z86.59] 10/02/2013  . Postural imbalance with levoscoliosis, h/o [R29.3] 10/02/2013  . Chest pain [R07.9] 12/25/2012  . IRRITABLE BOWEL SYNDROME [K58.9] 08/09/2008  . HYPERLIPIDEMIA  [E78.5] 10/28/2006  . DISORDER, BIPOLAR NOS [F31.9] 10/28/2006  . ANXIETY [F41.1] 10/28/2006  . DEPRESSION [F32.9] 10/28/2006  . PAIN, CHRONIC NEC [G89.29] 10/28/2006  . GERD [K21.9] 10/28/2006   Total Time spent with patient: 25 minutes  Past Psychiatric History: Please see H&P.   Past Medical History:  Past Medical History:  Diagnosis Date  . Anxiety   . Arthritis   . Bipolar affective disorder ([place])   . Chronic back pain   . DDD (degenerative disc disease)   . Depression   . DJD (degenerative joint disease)   . Elevated liver function tests   . Fibromyalgia   . GERD (gastroesophageal reflux disease)   . Head injury   . Hyperlipidemia   . Hypothyroidism   . IBS (irritable bowel syndrome)   . Inguinal hernia    left  . Myofascial pain syndrome   . Osteopenia   . Sleep apnea   . Tibial plateau fracture, left     Past Surgical History:  Procedure Laterality Date  . CARDIAC CATHETERIZATION N/A 08/31/2015   Procedure: Left Heart Cath and Coronary Angiography;  Surgeon: Leonie Man, MD;  Location: [facility];  Service: Cardiovascular;  Laterality: N/A;  . Carotid Dopplers Bilateral 02/20/2015   [facility]: Mild, less than 39% left and right internal carotid artery stenosis. No significant plaque burden  . CERVICAL DISC SURGERY     C5-7  . COLONOSCOPY    . COLONOSCOPY    . ESOPHAGOGASTRODUODENOSCOPY    . FASCIOTOMY Left 10/20/2013   Procedure: LEFT ANTERIOR COMPARTMENT FACSCIOTOMY;  Surgeon: Rozanna Box, MD;  Location: [facility];  Service: Orthopedics;  Laterality: Left;  . INGUINAL HERNIA REPAIR Left   .  Nuclear Stress Test  03/06/2015   [facility]: Normal EKG. Low normal EF (49%) normal regional wall motion. No evidence of ischemia or infarction.  . ORIF TIBIA PLATEAU Left 10/20/2013   Procedure: OPEN REDUCTION INTERNAL FIXATION (ORIF) LEFT TIBIAL PLATEAU;  Surgeon: Rozanna Box, MD;  Location: [facility];  Service: Orthopedics;   Laterality: Left;  . SEPTOPLASTY    . SPINAL CORD STIMULATOR IMPLANT    . TRANSTHORACIC ECHOCARDIOGRAM  02/20/2015   [facility]: Mild concentric LVH. EF 55-60%. Normal regional wall motion. Mild to moderate TR with no significant pulmonary hypertension.   Family History:  Family History  Problem Relation Age of Onset  . Heart disease Mother   . Hypertension Mother   . Sudden death Mother     Presumably cardiac  . Hyperlipidemia Father   . Heart attack Father 46    At least 5 MIs. Had CABG.  . Heart failure Father   . Diabetes Father   . Hypertension Father   . Kidney disease Father   . Diabetes Brother   . Lung cancer Maternal Grandmother   . Diabetes Paternal Grandmother   . Sudden death Paternal Grandmother     Unclear etiology  . Diabetes    . Kidney disease    . Kidney disease Brother   . Hypertension Brother   . Sudden death Brother     Thought to be related to heart disease  . Throat cancer Brother   . Colon cancer Neg Hx    Family Psychiatric  History: Please see H&P.  Social History: Please see H&P.  History  Alcohol Use No     History  Drug Use No    Social History   Social History  . Marital status: Married    Spouse name: N/A  . Number of children: 1  . Years of education: N/A   Occupational History  . disabled    Social History Main Topics  . Smoking status: Current Some Day Smoker    Packs/day: 1.00    Years: 33.00    Types: Cigarettes  . Smokeless tobacco: Former Systems developer    Quit date: 02/08/2015  . Alcohol use No  . Drug use: No  . Sexual activity: Not Asked   Other Topics Concern  . None   Social History Narrative   He is a separated father of 1. His daughter who is in nursing school lives with him part-time.   He is currently disabled due to chronic back pain.    He did some college education.   He currently is trying to use electronic cigarettes but has smoked one pack a day for over 35 years.   He has recently stopped  using prescription benzodiazepine and pain medications because he is in the process of changing pain med doctors.   He does not routinely exercise secondary to back, neck pain as well as dyspnea.   Additional Social History:    Pain Medications: See PTA Prescriptions: See PTA Over the Counter: See PTA  History of alcohol / drug use?: Yes Negative Consequences of Use: Personal relationships Withdrawal Symptoms: Other (Comment) (None reported) Name of Substance 1: cocaine 1 - Age of First Use: unknown 1 - Amount (size/oz): unknown 1 - Frequency: once 1 - Duration: unknown 1 - Last Use / Amount: "I don't remember"                  Sleep: Fair  Appetite:  Fair  Current  Medications: Current Facility-Administered Medications  Medication Dose Route Frequency Provider Last Rate Last Dose  . alum & mag hydroxide-simeth (MAALOX/MYLANTA) 200-200-20 MG/5ML suspension 30 mL  30 mL Oral Q4H PRN Niel Hummer, NP      . aspirin chewable tablet 81 mg  81 mg Oral Daily Niel Hummer, NP   81 mg at 12/29/15 0759  . gabapentin (NEURONTIN) capsule 300 mg  300 mg Oral TID Ursula Alert, MD   300 mg at 12/29/15 1254  . hydrOXYzine (ATARAX/VISTARIL) tablet 50 mg  50 mg Oral Q6H PRN Niel Hummer, NP   50 mg at 12/28/15 2131  . ibuprofen (ADVIL,MOTRIN) tablet 200 mg  200 mg Oral Q6H PRN Ursula Alert, MD      . lamoTRIgine (LAMICTAL) tablet 25 mg  25 mg Oral Daily Saramma Eappen, MD   25 mg at 12/29/15 0800  . levothyroxine (SYNTHROID, LEVOTHROID) tablet 75 mcg  75 mcg Oral QAC breakfast Niel Hummer, NP   75 mcg at 12/29/15 [phone]  . lidocaine (LIDODERM) 5 % 1 patch  1 patch Transdermal Q24H Ursula Alert, MD   1 patch at 12/29/15 1255  . magnesium hydroxide (MILK OF MAGNESIA) suspension 30 mL  30 mL Oral Daily PRN Niel Hummer, NP      . metoprolol tartrate (LOPRESSOR) tablet 25 mg  25 mg Oral BID Niel Hummer, NP   25 mg at 12/29/15 0800  . nicotine (NICODERM CQ - dosed in mg/24 hours)  patch 21 mg  21 mg Transdermal Daily Ursula Alert, MD   21 mg at 12/29/15 0803  . nitroGLYCERIN (NITROSTAT) SL tablet 0.4 mg  0.4 mg Sublingual Q5 min PRN Saramma Eappen, MD      . OLANZapine zydis (ZYPREXA) disintegrating tablet 5 mg  5 mg Oral BID PRN Ursula Alert, MD       Or  . OLANZapine (ZYPREXA) injection 5 mg  5 mg Intramuscular BID PRN Ursula Alert, MD      . OLANZapine zydis (ZYPREXA) disintegrating tablet 5 mg  5 mg Oral QHS Ursula Alert, MD   5 mg at 12/28/15 2131  . oxyCODONE (Oxy IR/ROXICODONE) immediate release tablet 10 mg  10 mg Oral Q6H PRN Niel Hummer, NP   10 mg at 12/29/15 [id number]  . pantoprazole (PROTONIX) EC tablet 40 mg  40 mg Oral BID AC Ursula Alert, MD   40 mg at 12/29/15 [id number]  . tiZANidine (ZANAFLEX) tablet 2 mg  2 mg Oral Q8H PRN Ursula Alert, MD   2 mg at 12/29/15 [id number]    Lab Results:  No results found for this or any previous visit (from the past 48 hour(s)).  Blood Alcohol level:  Lab Results  Component Value Date   [facility] <11 10/02/2013   ETH <11 [id number]    Metabolic Disorder Labs: Lab Results  Component Value Date   HGBA1C 5.2 12/26/2015   MPG 103 12/26/2015   MPG 100 12/26/2012   No results found for: PROLACTIN Lab Results  Component Value Date   CHOL 244 (H) 12/27/2015   TRIG 156 (H) 12/27/2015   HDL 37 (L) 12/27/2015   CHOLHDL 6.6 12/27/2015   VLDL 31 12/27/2015   LDLCALC 176 (H) 12/27/2015   LDLCALC 100 (H) 12/26/2012    Physical Findings: AIMS: Facial and Oral Movements Muscles of Facial Expression: None, normal Lips and Perioral Area: None, normal Jaw: None, normal Tongue: None, normal,Extremity Movements Upper (arms, wrists, hands, fingers): None, normal Lower (legs,  knees, ankles, toes): None, normal, Trunk Movements Neck, shoulders, hips: None, normal, Overall Severity Severity of abnormal movements (highest score from questions above): None, normal Incapacitation due to abnormal movements: None, normal Patient's  awareness of abnormal movements (rate only patient's report): No Awareness, Dental Status Current problems with teeth and/or dentures?: No Does patient usually wear dentures?: No  CIWA:    COWS:     Musculoskeletal: Strength & Muscle Tone: within normal limits Gait & Station: normal Patient leans: N/A  Psychiatric Specialty Exam: Physical Exam  Nursing note and vitals reviewed.   Review of Systems  Musculoskeletal: Positive for back pain.  Psychiatric/Behavioral: The patient is nervous/anxious.   All other systems reviewed and are negative.   Blood pressure 102/61, pulse 73, temperature 97.8 F (36.6 C), resp. rate 12, height 6' 0.5" (1.842 m), weight 100.5 kg (221 lb 8 oz), SpO2 98 %.Body mass index is 29.63 kg/m.  General Appearance: Fairly Groomed  Eye Contact:  Fair  Speech:  Clear and Coherent  Volume:  Normal  Mood:  Anxious  Affect:  Appropriate  Thought Process:  Goal Directed and Descriptions of Associations: Circumstantial  Orientation:  Full (Time, Place, and Person)  Thought Content:  Rumination  Suicidal Thoughts:  No  Homicidal Thoughts:  No  Memory:  Immediate;   Fair Recent;   Fair Remote;   Fair  Judgement:  Impaired  Insight:  Shallow  Psychomotor Activity:  Normal  Concentration:  Concentration: Fair and Attention Span: Fair  Recall:  [employer] of Knowledge:  Fair  Language:  Fair  Akathisia:  No  Handed:  Right  AIMS (if indicated):     Assets:  Desire for Improvement  ADL's:  Intact  Cognition:  WNL  Sleep:  Number of Hours: 3.75     Treatment Plan Summary: Jafeth is a 50 year old caucasian male who is separated , lives in [place] by self , presented  involuntarily from [place] unit, where he was initially admitted for Rhabdomyolysis , Acute liver injury and renal failure  - possible reaction to his medications - paravachol or SSRI/SNRI. Pt was medically cleared and transported to [facility].Patient continues to make progress  except for excessive worries about getting adequate treatment for his lower back pain.  PLAN: Daily contact with patient to assess and evaluate symptoms and progress in treatment and Medication management Will continue Zyprexa 5 mg po qhs for psychosis/mood sx. Will not restart Prozac at this time. Will continue Lamictal 25 mg po daily for mood sx. Continue Neurontin at 300 mg po tid for anxiety sx/pain- . Repeat CMP,CK- WNL ,  TSH -wnl , lipid panel- abnormal - placed dietician consult , hba1c- wnl . Restarted home medications where indicated. Will continue to monitor vitals ,medication compliance and treatment side effects while patient is here.  Will monitor for medical issues as well as call consult as needed.  CSW will continue working on disposition.  Patient to participate in therapeutic milieu .     Corena Pilgrim, MD 12/29/2015, 3:25 PM

## 2015-12-29 NOTE — Progress Notes (Signed)
DAR NOTE: Patient was angry on approach this morning.  Patient was concern about pain medication frequency.  Denies SI/HI, auditory and visual hallucinations.  Described energy level as normal and concentration as good.  Rates depression at 4, hopelessness at 3, and anxiety at 4.  Maintained on routine safety checks.  Medications given as prescribed.  Support and encouragement offered as needed.  Attended group and participated.  States goal for today is "staying calm."  Patient was visible in the dayroom.  Minimal interactiion with staff and peers. Patient requested and received Oxycodone 10 mg given for complain of pain with good effect.

## 2015-12-29 NOTE — Progress Notes (Signed)
Adult Psychoeducational Group Note  Date:  12/29/2015 Time:  8:40 PM  Group Topic/Focus:  Wrap-Up Group:   The focus of this group is to help patients review their daily goal of treatment and discuss progress on daily workbooks.   Participation Level:  Active  Participation Quality:  Appropriate  Affect:  Appropriate  Cognitive:  Alert  Insight: Appropriate  Engagement in Group:  Engaged  Modes of Intervention:  Discussion  Additional Comments:  Pt rated his day 7/10. He is looking forward to being discharged on Monday.  Wynelle Fanny R 12/29/2015, 8:40 PM

## 2015-12-30 NOTE — Plan of Care (Signed)
Problem: Medication: Goal: Compliance with prescribed medication regimen will improve Outcome: Progressing Pt has been compliant with medications this weekend

## 2015-12-30 NOTE — Progress Notes (Signed)
Patient ID: KRISTJAN SAYED, male   DOB: Feb 04, 1966, 50 y.o.   MRN: [id number] [facility] MD Progress Note  12/30/2015 2:07 PM CALLOWAY LISZKA  MRN:  [id number] Subjective:  Patient states " I am doing fine, I think I am ready to go home anytime my doctor discharge me.''  Objective:  Patient was seen, interviewed, chart reviewed and case discussed with treatment team. Patient says he is doing great except for his chronic back pain. He denies suicidal thoughts and continues to endorse decreased mood swings, depressive symptoms, says is no longer experiencing psychosis.   Principal Problem: Bipolar disorder, curr episode mixed, severe, with psychotic features ([place]) Diagnosis:   Patient Active Problem List   Diagnosis Date Noted  . Bipolar disorder, curr episode mixed, severe, with psychotic features ([place]) [F31.64] 12/26/2015  . Cocaine use disorder, mild, abuse [F14.10] 12/26/2015  . History of rhabdomyolysis [Z87.39] 12/26/2015  . Liver injury [S36.119A] 12/26/2015  . Hx of renal failure [Z87.448] 12/26/2015  . Angina, class III ([place]) [I20.9] 08/18/2015  . Dyslipidemia, goal LDL below 70 [E78.5] 08/18/2015  . Family history of premature coronary artery disease [Z82.49] 08/18/2015  . Tobacco use disorder, continuous [Z72.0] 08/18/2015  . Tibial plateau fracture [S82.143A] 10/02/2013  . Multiple falls [R29.6] 10/02/2013  . Alteration in self-care ability [R53.81] 10/02/2013  . 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 Fracture of tibia with fibula, left, closed [S82.92XA] 10/02/2013  . Chronic back pain [M54.9, G89.29] 10/02/2013  . 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 History of suicidal ideation [Z86.59] 10/02/2013  . Postural imbalance with levoscoliosis, h/o [R29.3] 10/02/2013  . Chest pain [R07.9] 12/25/2012  . IRRITABLE BOWEL SYNDROME [K58.9] 08/09/2008  . HYPERLIPIDEMIA [E78.5] 10/28/2006  . DISORDER, BIPOLAR NOS [F31.9] 10/28/2006  . ANXIETY [F41.1]  10/28/2006  . DEPRESSION [F32.9] 10/28/2006  . PAIN, CHRONIC NEC [G89.29] 10/28/2006  . GERD [K21.9] 10/28/2006   Total Time spent with patient: 15 minutes  Past Psychiatric History: Please see H&P.   Past Medical History:  Past Medical History:  Diagnosis Date  . Anxiety   . Arthritis   . Bipolar affective disorder ([place])   . Chronic back pain   . DDD (degenerative disc disease)   . Depression   . DJD (degenerative joint disease)   . Elevated liver function tests   . Fibromyalgia   . GERD (gastroesophageal reflux disease)   . Head injury   . Hyperlipidemia   . Hypothyroidism   . IBS (irritable bowel syndrome)   . Inguinal hernia    left  . Myofascial pain syndrome   . Osteopenia   . Sleep apnea   . Tibial plateau fracture, left     Past Surgical History:  Procedure Laterality Date  . CARDIAC CATHETERIZATION N/A 08/31/2015   Procedure: Left Heart Cath and Coronary Angiography;  Surgeon: Leonie Man, MD;  Location: [facility];  Service: Cardiovascular;  Laterality: N/A;  . Carotid Dopplers Bilateral 02/20/2015   [facility]: Mild, less than 39% left and right internal carotid artery stenosis. No significant plaque burden  . CERVICAL DISC SURGERY     C5-7  . COLONOSCOPY    . COLONOSCOPY    . ESOPHAGOGASTRODUODENOSCOPY    . FASCIOTOMY Left 10/20/2013   Procedure: LEFT ANTERIOR COMPARTMENT FACSCIOTOMY;  Surgeon: Rozanna Box, MD;  Location: [facility];  Service: Orthopedics;  Laterality: Left;  . INGUINAL HERNIA REPAIR Left   . Nuclear Stress Test  03/06/2015   [facility]: Normal EKG. Low normal EF (49%) normal regional wall motion. No evidence of ischemia or infarction.  . ORIF TIBIA PLATEAU Left 10/20/2013   Procedure: OPEN REDUCTION INTERNAL FIXATION (ORIF) LEFT TIBIAL PLATEAU;  Surgeon: Rozanna Box, MD;  Location: [place];  Service: Orthopedics;  Laterality: Left;  . SEPTOPLASTY    . SPINAL CORD STIMULATOR IMPLANT    . TRANSTHORACIC  ECHOCARDIOGRAM  02/20/2015   [facility]: Mild concentric LVH. EF 55-60%. Normal regional wall motion. Mild to moderate TR with no significant pulmonary hypertension.   Family History:  Family History  Problem Relation Age of Onset  . Heart disease Mother   . Hypertension Mother   . Sudden death Mother     Presumably cardiac  . Hyperlipidemia Father   . Heart attack Father 59    At least 5 MIs. Had CABG.  . Heart failure Father   . Diabetes Father   . Hypertension Father   . Kidney disease Father   . Diabetes Brother   . Lung cancer Maternal Grandmother   . Diabetes Paternal Grandmother   . Sudden death Paternal Grandmother     Unclear etiology  . Diabetes    . Kidney disease    . Kidney disease Brother   . Hypertension Brother   . Sudden death Brother     Thought to be related to heart disease  . Throat cancer Brother   . Colon cancer Neg Hx    Family Psychiatric  History: Please see H&P.  Social History: Please see H&P.  History  Alcohol Use No     History  Drug Use No    Social History   Social History  . Marital status: Married    Spouse name: N/A  . Number of children: 1  . Years of education: N/A   Occupational History  . disabled    Social History Main Topics  . Smoking status: Current Some Day Smoker    Packs/day: 1.00    Years: 33.00    Types: Cigarettes  . Smokeless tobacco: Former Systems developer    Quit date: 02/08/2015  . Alcohol use No  . Drug use: No  . Sexual activity: Not Asked   Other Topics Concern  . None   Social History Narrative   He is a separated father of 1. His daughter who is in nursing school lives with him part-time.   He is currently disabled due to chronic back pain.    He did some college education.   He currently is trying to use electronic cigarettes but has smoked one pack a day for over 35 years.   He has recently stopped using prescription benzodiazepine and pain medications because he is in the process of  changing pain med doctors.   He does not routinely exercise secondary to back, neck pain as well as dyspnea.   Additional Social History:    Pain Medications: See PTA Prescriptions: See PTA Over the Counter: See PTA  History of alcohol / drug use?: Yes Negative Consequences of Use: Personal relationships Withdrawal Symptoms: Other (Comment) (None reported) Name of Substance 1: cocaine 1 - Age of First Use: unknown 1 - Amount (size/oz): unknown 1 - Frequency: once 1 - Duration: unknown 1 - Last Use / Amount: "I don't remember"                  Sleep: Fair  Appetite:  Fair  Current Medications: Current Facility-Administered Medications  Medication Dose Route Frequency  Provider Last Rate Last Dose  . alum & mag hydroxide-simeth (MAALOX/MYLANTA) 200-200-20 MG/5ML suspension 30 mL  30 mL Oral Q4H PRN Niel Hummer, NP      . aspirin chewable tablet 81 mg  81 mg Oral Daily Niel Hummer, NP   81 mg at 12/30/15 1209  . gabapentin (NEURONTIN) capsule 300 mg  300 mg Oral TID Ursula Alert, MD   300 mg at 12/30/15 1210  . hydrOXYzine (ATARAX/VISTARIL) tablet 50 mg  50 mg Oral Q6H PRN Niel Hummer, NP   50 mg at 12/29/15 1835  . ibuprofen (ADVIL,MOTRIN) tablet 200 mg  200 mg Oral Q6H PRN Ursula Alert, MD      . lamoTRIgine (LAMICTAL) tablet 25 mg  25 mg Oral Daily Ursula Alert, MD   25 mg at 12/30/15 1209  . levothyroxine (SYNTHROID, LEVOTHROID) tablet 75 mcg  75 mcg Oral QAC breakfast Niel Hummer, NP   75 mcg at 12/30/15 [id number]  . lidocaine (LIDODERM) 5 % 1 patch  1 patch Transdermal Q24H Ursula Alert, MD   1 patch at 12/29/15 1255  . magnesium hydroxide (MILK OF MAGNESIA) suspension 30 mL  30 mL Oral Daily PRN Niel Hummer, NP      . metoprolol tartrate (LOPRESSOR) tablet 25 mg  25 mg Oral BID Niel Hummer, NP   25 mg at 12/30/15 1209  . nicotine (NICODERM CQ - dosed in mg/24 hours) patch 21 mg  21 mg Transdermal Daily Ursula Alert, MD   21 mg at 12/30/15 1209  .  nitroGLYCERIN (NITROSTAT) SL tablet 0.4 mg  0.4 mg Sublingual Q5 min PRN Ursula Alert, MD      . OLANZapine zydis (ZYPREXA) disintegrating tablet 5 mg  5 mg Oral BID PRN Ursula Alert, MD   5 mg at 12/29/15 2237   Or  . OLANZapine (ZYPREXA) injection 5 mg  5 mg Intramuscular BID PRN Ursula Alert, MD      . OLANZapine zydis (ZYPREXA) disintegrating tablet 5 mg  5 mg Oral QHS Ursula Alert, MD   5 mg at 12/29/15 2107  . oxyCODONE (Oxy IR/ROXICODONE) immediate release tablet 10 mg  10 mg Oral Q6H PRN Niel Hummer, NP   10 mg at 12/30/15 1210  . pantoprazole (PROTONIX) EC tablet 40 mg  40 mg Oral BID AC Ursula Alert, MD   40 mg at 12/30/15 0610  . tiZANidine (ZANAFLEX) tablet 2 mg  2 mg Oral Q8H PRN Ursula Alert, MD   2 mg at 12/30/15 0610    Lab Results:  No results found for this or any previous visit (from the past 48 hour(s)).  Blood Alcohol level:  Lab Results  Component Value Date   [facility] <11 10/02/2013   ETH <11 [id number]    Metabolic Disorder Labs: Lab Results  Component Value Date   HGBA1C 5.2 12/26/2015   MPG 103 12/26/2015   MPG 100 12/26/2012   No results found for: PROLACTIN Lab Results  Component Value Date   CHOL 244 (H) 12/27/2015   TRIG 156 (H) 12/27/2015   HDL 37 (L) 12/27/2015   CHOLHDL 6.6 12/27/2015   VLDL 31 12/27/2015   LDLCALC 176 (H) 12/27/2015   LDLCALC 100 (H) 12/26/2012    Physical Findings: AIMS: Facial and Oral Movements Muscles of Facial Expression: None, normal Lips and Perioral Area: None, normal Jaw: None, normal Tongue: None, normal,Extremity Movements Upper (arms, wrists, hands, fingers): None, normal Lower (legs, knees, ankles, toes): None, normal, Trunk  Movements Neck, shoulders, hips: None, normal, Overall Severity Severity of abnormal movements (highest score from questions above): None, normal Incapacitation due to abnormal movements: None, normal Patient's awareness of abnormal movements (rate only patient's report): No  Awareness, Dental Status Current problems with teeth and/or dentures?: No Does patient usually wear dentures?: No  CIWA:    COWS:     Musculoskeletal: Strength & Muscle Tone: within normal limits Gait & Station: normal Patient leans: N/A  Psychiatric Specialty Exam: Physical Exam  Nursing note and vitals reviewed.   Review of Systems  Musculoskeletal: Positive for back pain.  Psychiatric/Behavioral: The patient is nervous/anxious.   All other systems reviewed and are negative.   Blood pressure 101/72, pulse 70, temperature 97.8 F (36.6 C), temperature source Oral, resp. rate 18, height 6' 0.5" (1.842 m), weight 100.5 kg (221 lb 8 oz), SpO2 98 %.Body mass index is 29.63 kg/m.  General Appearance: Fairly Groomed  Eye Contact:  Fair  Speech:  Clear and Coherent  Volume:  Normal  Mood:  Anxious  Affect:  Appropriate  Thought Process:  Coherent, Goal Directed and Descriptions of Associations: Intact  Orientation:  Full (Time, Place, and Person)  Thought Content:  Logical and Rumination  Suicidal Thoughts:  No  Homicidal Thoughts:  No  Memory:  Immediate;   Fair Recent;   Fair Remote;   Fair  Judgement:  Impaired  Insight:  Shallow  Psychomotor Activity:  Normal  Concentration:  Concentration: Fair and Attention Span: Fair  Recall:  [employer] of Knowledge:  Fair  Language:  Fair  Akathisia:  No  Handed:  Right  AIMS (if indicated):     Assets:  Desire for Improvement  ADL's:  Intact  Cognition:  WNL  Sleep:  Number of Hours: 6     Treatment Plan Summary: Koen is a 50 year old caucasian male who is separated , lives in [place] by self , presented  involuntarily from [facility] medical unit, where he was initially admitted for Rhabdomyolysis , Acute liver injury and renal failure  - possible reaction to his medications - paravachol or SSRI/SNRI. Pt was medically cleared and transported to [facility]Patient continues to make progress except for excessive worries  about getting adequate treatment for his lower back pain.  PLAN: Daily contact with patient to assess and evaluate symptoms and progress in treatment and Medication management Will continue Zyprexa 5 mg po qhs for psychosis/mood sx. Will continue Lamictal 25 mg po daily for mood sx. Continue Neurontin at 300 mg po tid for anxiety sx/pain- . Repeat CMP,CK- WNL ,  TSH -wnl , lipid panel- abnormal - placed dietician consult , hba1c- wnl . Restarted home medications where indicated. Will continue to monitor vitals ,medication compliance and treatment side effects while patient is here.  Will monitor for medical issues as well as call consult as needed.  CSW will continue working on disposition.  Patient to participate in therapeutic milieu .     Corena Pilgrim, MD 12/30/2015, 2:07 PM

## 2015-12-30 NOTE — Progress Notes (Signed)
Adult Psychoeducational Group Note  Date:  12/30/2015 Time:  9:03 PM  Group Topic/Focus:  Wrap-Up Group:   The focus of this group is to help patients review their daily goal of treatment and discuss progress on daily workbooks.   Participation Level:  Active  Participation Quality:  Appropriate and Attentive  Affect:  Appropriate  Cognitive:  Appropriate  Insight: Appropriate  Engagement in Group:  Engaged  Modes of Intervention:  Discussion  Additional Comments:  Pt stated he felt okay today. Pt stated he slept through most of the day and missed the music group in the morning. Pt stated he is glad to be going home tomorrow.   Clint Bolder 12/30/2015, 9:03 PM

## 2015-12-30 NOTE — [facility] Group Notes (Signed)
[facility] Group Notes: (Clinical Social Work)   12/30/2015      Type of Therapy:  Group Therapy   Participation Level:  Did Not Attend despite MHT prompting   Selmer Dominion, LCSW 12/30/2015, 2:15 PM

## 2015-12-30 NOTE — Progress Notes (Signed)
D.  Pt pleasant on approach, positive for evening wrap up group.  Pt looking forward to discharge tomorrow.  Pt denies SI/HI/hallucinations at this time.  Pt does suffer from chronic pain and receives medication for this around the clock.  He states it does bring his pain level down to around a manageable two as long as this continues.   A.  Support and encouragement offered. Mediation given as ordered.  R.  Pt remains safe on the unit, will continue to monitor.

## 2015-12-30 NOTE — Progress Notes (Signed)
Patient ID: [name], male   DOB: [birth year], 50 y.o.   MRN: [id number]   D: Pt has been very flat today on the unit. Pt remained in the bed most of the morning, he did not attend any groups nor did he engage in treatment. Pt did wake up at lunch he reported that he was just tired. Pt reported that his depression was a 3, his hopelessness was a 3, and his anxiety was a 3. Pt reported that his goal for today was to work on exit plan. Pt reported being negative SI/HI, no AH/VH noted. A: 15 min checks continued for patient safety. R: Pt safety maintained.

## 2015-12-31 MED ORDER — OLANZAPINE 5 MG PO TBDP: 5.0000 mg | ORAL_TABLET | Freq: Every day | ORAL | 0 refills | Status: DC

## 2015-12-31 MED ORDER — LAMOTRIGINE 25 MG PO TABS: 25.0000 mg | ORAL_TABLET | Freq: Every day | ORAL | 0 refills | Status: DC

## 2015-12-31 MED ORDER — METOPROLOL TARTRATE 25 MG PO TABS: 25.0000 mg | ORAL_TABLET | Freq: Two times a day (BID) | ORAL | 0 refills | Status: DC

## 2015-12-31 MED ORDER — LIDOCAINE 5 % EX PTCH: 1.0000 | MEDICATED_PATCH | CUTANEOUS | 0 refills | Status: DC

## 2015-12-31 MED ORDER — ASPIRIN 81 MG PO CHEW: 81.0000 mg | CHEWABLE_TABLET | Freq: Every day | ORAL | 0 refills | Status: DC

## 2015-12-31 MED ORDER — NICOTINE 21 MG/24HR TD PT24: 21.0000 mg | MEDICATED_PATCH | Freq: Every day | TRANSDERMAL | 0 refills | Status: DC

## 2015-12-31 MED ORDER — PANTOPRAZOLE SODIUM 40 MG PO TBEC: 40.0000 mg | DELAYED_RELEASE_TABLET | Freq: Two times a day (BID) | ORAL | 0 refills | Status: DC

## 2015-12-31 MED ORDER — TIZANIDINE HCL 2 MG PO TABS: 2.0000 mg | ORAL_TABLET | Freq: Three times a day (TID) | ORAL | 0 refills | Status: DC | PRN

## 2015-12-31 MED ORDER — GABAPENTIN 300 MG PO CAPS: 300.0000 mg | ORAL_CAPSULE | Freq: Three times a day (TID) | ORAL | 0 refills | Status: DC

## 2015-12-31 MED ORDER — HYDROXYZINE HCL 50 MG PO TABS: 50.0000 mg | ORAL_TABLET | Freq: Four times a day (QID) | ORAL | 0 refills | Status: DC | PRN

## 2015-12-31 NOTE — Progress Notes (Signed)
  [facility] Adult Case Management Discharge Plan :  Will you be returning to the same living situation after discharge:  Yes,  home At discharge, do you have transportation home?: Yes,  family Do you have the ability to pay for your medications: Yes,  has insurance  Release of information consent forms completed and in the chart;  Patient's signature needed at discharge.  Patient to Follow up at: Follow-up Information    [facility] on 01/04/2016.   Why:  Friday at 2:00 with Dr Nile Riggs information: [address] [place]  [phone]          Next level of care provider has access to [place] and Suicide Prevention discussed: Yes,  reviewed in group  Have you used any form of tobacco in the last 30 days? (Cigarettes, Smokeless Tobacco, Cigars, and/or Pipes): Yes  Has patient been referred to the Quitline?: Patient refused referral  Patient has been referred for addiction treatment: Yes  Beverely Pace 12/31/2015, 11:28 AM

## 2015-12-31 NOTE — Progress Notes (Signed)
Pt slept well, pain controlled with medication orders as they are now.  Pt received pain medication and is up and intending to go to breakfast.  No complaints voiced.

## 2015-12-31 NOTE — Progress Notes (Signed)
Polite and appropriate, affect blunted but appropriate.  Denied concerns today, denied SI, HI and AVH.  Chronic pain stable, patient says it's well managed on current regimen.  Received order to discharge patient.  Reviewed medicines, aftercare, and discharge instructions.  Copies of scripts given.  Belongings returned per belongings record.   Discharged to waiting room, patient stated wife was picking him up.  To follow up per AVS instructions.

## 2015-12-31 NOTE — Discharge Summary (Signed)
Physician Discharge Summary Note  Patient:  [name] is an 50 y.o., male MRN:  [id number] DOB:  1966-03-16 Patient phone:  [phone] (home)  Patient address:   [address] [place] [address],  Total Time spent with patient: 30 minutes  Date of Admission:  12/25/2015 Date of Discharge: 12/31/2015  Reason for Admission:  Snorting cocaine  Principal Problem: Bipolar disorder, curr episode mixed, severe, with psychotic features [facility]) Discharge Diagnoses: Patient Active Problem List   Diagnosis Date Noted  . Bipolar disorder, curr episode mixed, severe, with psychotic features ([place]) [F31.64] 12/26/2015  . Cocaine use disorder, mild, abuse [F14.10] 12/26/2015  . History of rhabdomyolysis [Z87.39] 12/26/2015  . Liver injury [S36.119A] 12/26/2015  . Hx of renal failure [Z87.448] 12/26/2015  . Angina, class III ([place]) [I20.9] 08/18/2015  . Dyslipidemia, goal LDL below 70 [E78.5] 08/18/2015  . Family history of premature coronary artery disease [Z82.49] 08/18/2015  . Tobacco use disorder, continuous [Z72.0] 08/18/2015  . Tibial plateau fracture [S82.143A] 10/02/2013  . Multiple falls [R29.6] 10/02/2013  . Alteration in self-care ability [R53.81] 10/02/2013  . 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 Fracture of tibia with fibula, left, closed [S82.92XA] 10/02/2013  . Chronic back pain [M54.9, G89.29] 10/02/2013  . 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 History of suicidal ideation [Z86.59] 10/02/2013  . Postural imbalance with levoscoliosis, h/o [R29.3] 10/02/2013  . Chest pain [R07.9] 12/25/2012  . IRRITABLE BOWEL SYNDROME [K58.9] 08/09/2008  . HYPERLIPIDEMIA [E78.5] 10/28/2006  . DISORDER, BIPOLAR NOS [F31.9] 10/28/2006  . ANXIETY [F41.1] 10/28/2006  . DEPRESSION [F32.9] 10/28/2006  . PAIN, CHRONIC NEC [G89.29] 10/28/2006  . GERD [K21.9] 10/28/2006    Past Psychiatric History:  See HPI  Past Medical History:  Past Medical  History:  Diagnosis Date  . Anxiety   . Arthritis   . Bipolar affective disorder ([place])   . Chronic back pain   . DDD (degenerative disc disease)   . Depression   . DJD (degenerative joint disease)   . Elevated liver function tests   . Fibromyalgia   . GERD (gastroesophageal reflux disease)   . Head injury   . Hyperlipidemia   . Hypothyroidism   . IBS (irritable bowel syndrome)   . Inguinal hernia    left  . Myofascial pain syndrome   . Osteopenia   . Sleep apnea   . Tibial plateau fracture, left     Past Surgical History:  Procedure Laterality Date  . CARDIAC CATHETERIZATION N/A 08/31/2015   Procedure: Left Heart Cath and Coronary Angiography;  Surgeon: Leonie Man, MD;  Location: [place] [facility];  Service: Cardiovascular;  Laterality: N/A;  . Carotid Dopplers Bilateral 02/20/2015   [facility]: Mild, less than 39% left and right internal carotid artery stenosis. No significant plaque burden  . CERVICAL DISC SURGERY     C5-7  . COLONOSCOPY    . COLONOSCOPY    . ESOPHAGOGASTRODUODENOSCOPY    . FASCIOTOMY Left 10/20/2013   Procedure: LEFT ANTERIOR COMPARTMENT FACSCIOTOMY;  Surgeon: Rozanna Box, MD;  Location: [facility];  Service: Orthopedics;  Laterality: Left;  . INGUINAL HERNIA REPAIR Left   . Nuclear Stress Test  03/06/2015   [facility]: Normal EKG. Low normal EF (49%) normal regional wall motion. No evidence of ischemia or infarction.  . ORIF TIBIA PLATEAU Left 10/20/2013   Procedure: OPEN REDUCTION INTERNAL FIXATION (ORIF) LEFT TIBIAL PLATEAU;  Surgeon: Rozanna Box, MD;  Location: [place];  Service: Orthopedics;  Laterality: Left;  . SEPTOPLASTY    . SPINAL CORD STIMULATOR IMPLANT    . TRANSTHORACIC ECHOCARDIOGRAM  02/20/2015   [facility]: Mild concentric LVH. EF 55-60%. Normal regional wall motion. Mild to moderate TR with no significant pulmonary hypertension.   Family History:  Family History  Problem Relation Age of Onset   . Heart disease Mother   . Hypertension Mother   . Sudden death Mother     Presumably cardiac  . Hyperlipidemia Father   . Heart attack Father 35    At least 5 MIs. Had CABG.  . Heart failure Father   . Diabetes Father   . Hypertension Father   . Kidney disease Father   . Diabetes Brother   . Lung cancer Maternal Grandmother   . Diabetes Paternal Grandmother   . Sudden death Paternal Grandmother     Unclear etiology  . Diabetes    . Kidney disease    . Kidney disease Brother   . Hypertension Brother   . Sudden death Brother     Thought to be related to heart disease  . Throat cancer Brother   . Colon cancer Neg Hx    Family Psychiatric  History: see HP1 Social History:  History  Alcohol Use No     History  Drug Use No    Social History   Social History  . Marital status: Married    Spouse name: N/A  . Number of children: 1  . Years of education: N/A   Occupational History  . disabled    Social History Main Topics  . Smoking status: Current Some Day Smoker    Packs/day: 1.00    Years: 33.00    Types: Cigarettes  . Smokeless tobacco: Former Systems developer    Quit date: 02/08/2015  . Alcohol use No  . Drug use: No  . Sexual activity: Not Asked   Other Topics Concern  . None   Social History Narrative   He is a separated father of 1. His daughter who is in nursing school lives with him part-time.   He is currently disabled due to chronic back pain.    He did some college education.   He currently is trying to use electronic cigarettes but has smoked one pack a day for over 35 years.   He has recently stopped using prescription benzodiazepine and pain medications because he is in the process of changing pain med doctors.   He does not routinely exercise secondary to back, neck pain as well as dyspnea.    Hospital Course:  [name] is a 50 year old male who is separated presented  involuntarily from [facility] unit, where he was initially admitted for  Rhabdomyolysis , Acute liver injury and renal failure  - possible reaction to his medications - paravachol or SSRI/SNRI. Pt was medically cleared and transported to [facility]. Per initial notes in EHR : " He has history of bipolar disorder with recent med changes. He was found crushing cocaine and motrin then snorting as well as drinking windex because he thought it was blue koolaid.   Baker Janus was admitted for Bipolar disorder, curr episode mixed, severe, with psychotic features ([place]) and crisis management.  He was treated with gabapentin (NEURONTIN) 300 MG TID for anxiety, lamoTRIgine (LAMICTAL) 25 MG tablet mood stabilization and OLANZapine zydis (ZYPREXA) 5 MG disintegrating tablet mood stabilization.  Medical problems were identified and treated as needed.  Home medications were restarted  as appropriate.  Improvement was monitored by observation and Baker Janus daily report of symptom reduction.  Emotional and mental status was monitored by daily self inventory reports completed by Baker Janus and clinical staff.  Patient reported continued improvement, denied any new concerns.  Patient had been compliant on medications and denied side effects.  Support and encouragement was provided.    At time of discharge, patient rated both depression and anxiety levels to be manageable and minimal.  Patient encouraged to attend groups to help with recognizing triggers of emotional crises and de-stabilizations.  Patient encouraged to attend group to help identify the positive things in life that would help in dealing with feelings of loss, depression and unhealthy or abusive tendencies.         Baker Janus was evaluated by the treatment team for stability and plans for continued recovery upon discharge.  He was offered further treatment options upon discharge including Residential, Intensive Outpatient and Outpatient treatment. He will follow up with agency listed below for medication management and  counseling.  Encouraged patient to maintain satisfactory support network and home environment.  Advised to adhere to medication compliance and outpatient treatment follow up.  Prescriptions provided.       Baker Janus motivation was an integral factor for scheduling further treatment.  Employment, transportation, bed availability, health status, family support, and any pending legal issues were also considered during his hospital stay.  Upon completion of this admission the patient was both mentally and medically stable for discharge denying suicidal/homicidal ideation, auditory/visual/tactile hallucinations, delusional thoughts and paranoia.       Physical Findings: AIMS: Facial and Oral Movements Muscles of Facial Expression: None, normal Lips and Perioral Area: None, normal Jaw: None, normal Tongue: None, normal,Extremity Movements Upper (arms, wrists, hands, fingers): None, normal Lower (legs, knees, ankles, toes): None, normal, Trunk Movements Neck, shoulders, hips: None, normal, Overall Severity Severity of abnormal movements (highest score from questions above): None, normal Incapacitation due to abnormal movements: None, normal Patient's awareness of abnormal movements (rate only patient's report): No Awareness, Dental Status Current problems with teeth and/or dentures?: No Does patient usually wear dentures?: No  CIWA:    COWS:     Musculoskeletal: Strength & Muscle Tone: within normal limits Gait & Station: normal Patient leans: N/A  Psychiatric Specialty Exam:  SEE MD SRA Physical Exam  ROS  Blood pressure 125/72, pulse (!) 59, temperature 98.3 F (36.8 C), temperature source Oral, resp. rate 20, height 6' 0.5" (1.842 m), weight 100.5 kg (221 lb 8 oz), SpO2 98 %.Body mass index is 29.63 kg/m.   Have you used any form of tobacco in the last 30 days? (Cigarettes, Smokeless Tobacco, Cigars, and/or Pipes): Yes  Has this patient used any form of tobacco in the last 30  days? (Cigarettes, Smokeless Tobacco, Cigars, and/or Pipes) Yes, N/A  Blood Alcohol level:  Lab Results  Component Value Date   [facility] <11 10/02/2013   ETH <11 [id number]    Metabolic Disorder Labs:  Lab Results  Component Value Date   HGBA1C 5.2 12/26/2015   MPG 103 12/26/2015   MPG 100 12/26/2012   No results found for: PROLACTIN Lab Results  Component Value Date   CHOL 244 (H) 12/27/2015   TRIG 156 (H) 12/27/2015   HDL 37 (L) 12/27/2015   CHOLHDL 6.6 12/27/2015   VLDL 31 12/27/2015   LDLCALC 176 (H) 12/27/2015   LDLCALC 100 (H) 12/26/2012    See Psychiatric Specialty Exam  and Suicide Risk Assessment completed by Attending Physician prior to discharge.  Discharge destination:  Home  Is patient on multiple antipsychotic therapies at discharge:  No   Has Patient had three or more failed trials of antipsychotic monotherapy by history:  No  Recommended Plan for Multiple Antipsychotic Therapies: NA     Medication List    STOP taking these medications   aspirin EC 81 MG tablet Replaced by:  aspirin 81 MG chewable tablet   buPROPion 300 MG 24 hr tablet Commonly known as:  WELLBUTRIN XL   busPIRone 15 MG tablet Commonly known as:  BUSPAR   esomeprazole 40 MG capsule Commonly known as:  NEXIUM   FANAPT 12 MG Tabs Generic drug:  Iloperidone   FLUoxetine 40 MG capsule Commonly known as:  PROZAC   hydrOXYzine 100 MG capsule Commonly known as:  VISTARIL   hyoscyamine 0.375 MG 12 hr tablet Commonly known as:  LEVBID   levothyroxine 75 MCG tablet Commonly known as:  SYNTHROID, LEVOTHROID   meloxicam 7.5 MG tablet Commonly known as:  MOBIC   nitroGLYCERIN 0.4 MG SL tablet Commonly known as:  NITROSTAT   Oxycodone HCl 10 MG Tabs     TAKE these medications     Indication  aspirin 81 MG chewable tablet Chew 1 tablet (81 mg total) by mouth daily. Replaces:  aspirin EC 81 MG tablet  Indication:  Heart Attack   gabapentin 300 MG capsule Commonly known  as:  NEURONTIN Take 1 capsule (300 mg total) by mouth 3 (three) times daily.  Indication:  Agitation, Neuropathic Pain   hydrOXYzine 50 MG tablet Commonly known as:  ATARAX/VISTARIL Take 1 tablet (50 mg total) by mouth every 6 (six) hours as needed for anxiety.  Indication:  Anxiety Neurosis   lamoTRIgine 25 MG tablet Commonly known as:  LAMICTAL Take 1 tablet (25 mg total) by mouth daily.  Indication:  mood stabilization   lidocaine 5 % Commonly known as:  LIDODERM Place 1 patch onto the skin daily. Remove & Discard patch within 12 hours or as directed by MD  Indication:  pain   metoprolol tartrate 25 MG tablet Commonly known as:  LOPRESSOR Take 1 tablet (25 mg total) by mouth 2 (two) times daily.  Indication:  High Blood Pressure   nicotine 21 mg/24hr patch Commonly known as:  NICODERM CQ - dosed in mg/24 hours Place 1 patch (21 mg total) onto the skin daily.  Indication:  Nicotine Addiction   OLANZapine zydis 5 MG disintegrating tablet Commonly known as:  ZYPREXA Take 1 tablet (5 mg total) by mouth at bedtime.  Indication:  Manic-Depression   pantoprazole 40 MG tablet Commonly known as:  PROTONIX Take 1 tablet (40 mg total) by mouth 2 (two) times daily before a meal.  Indication:  Gastroesophageal Reflux Disease   tiZANidine 2 MG tablet Commonly known as:  ZANAFLEX Take 1 tablet (2 mg total) by mouth every 8 (eight) hours as needed for muscle spasms. What changed:  when to take this  reasons to take this  Indication:  Muscle Spasticity      Follow-up Information    [facility] Follow up on 01/04/2016.   Why:  Friday at 2:00 with Dr Nile Riggs information: [address] [place]  [phone]          Follow-up recommendations:  Activity:  as tol Diet:  as tol  Comments:  1.  Take all your medications as prescribed.  2.  Report any adverse side effects to outpatient provider. 3.  Patient instructed to not use alcohol or illegal drugs  while on prescription medicines. 4.  In the event of worsening symptoms, instructed patient to call 911, the crisis hotline or go to nearest emergency room for evaluation of symptoms.  Signed: Janett Labella, NP [facility] 12/31/2015, 2:14 PM

## 2015-12-31 NOTE — BHH Suicide Risk Assessment (Signed)
[facility] Discharge Suicide Risk Assessment   Principal Problem: Bipolar disorder, curr episode mixed, severe, with psychotic features [facility]) Discharge Diagnoses:  Patient Active Problem List   Diagnosis Date Noted  . Bipolar disorder, curr episode mixed, severe, with psychotic features ([place]) [F31.64] 12/26/2015  . Cocaine use disorder, mild, abuse [F14.10] 12/26/2015  . History of rhabdomyolysis [Z87.39] 12/26/2015  . Liver injury [S36.119A] 12/26/2015  . Hx of renal failure [Z87.448] 12/26/2015  . Angina, class III ([place]) [I20.9] 08/18/2015  . Dyslipidemia, goal LDL below 70 [E78.5] 08/18/2015  . Family history of premature coronary artery disease [Z82.49] 08/18/2015  . Tobacco use disorder, continuous [Z72.0] 08/18/2015  . Tibial plateau fracture [S82.143A] 10/02/2013  . Multiple falls [R29.6] 10/02/2013  . Alteration in self-care ability [R53.81] 10/02/2013  . 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 Fracture of tibia with fibula, left, closed [S82.92XA] 10/02/2013  . Chronic back pain [M54.9, G89.29] 10/02/2013  . 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 History of suicidal ideation [Z86.59] 10/02/2013  . Postural imbalance with levoscoliosis, h/o [R29.3] 10/02/2013  . Chest pain [R07.9] 12/25/2012  . IRRITABLE BOWEL SYNDROME [K58.9] 08/09/2008  . HYPERLIPIDEMIA [E78.5] 10/28/2006  . DISORDER, BIPOLAR NOS [F31.9] 10/28/2006  . ANXIETY [F41.1] 10/28/2006  . DEPRESSION [F32.9] 10/28/2006  . PAIN, CHRONIC NEC [G89.29] 10/28/2006  . GERD [K21.9] 10/28/2006    Total Time spent with patient: 30 minutes  Musculoskeletal: Strength & Muscle Tone: within normal limits Gait & Station: normal Patient leans: N/A  Psychiatric Specialty Exam: Review of Systems  Psychiatric/Behavioral: Positive for substance abuse. Negative for depression and hallucinations.  All other systems reviewed and are negative.   Blood pressure 125/72, pulse (!) 59, temperature 98.3  F (36.8 C), temperature source Oral, resp. rate 20, height 6' 0.5" (1.842 m), weight 100.5 kg (221 lb 8 oz), SpO2 98 %.Body mass index is 29.63 kg/m.  General Appearance: Casual  Eye Contact::  Fair  Speech:  Clear and Coherent409  Volume:  Normal  Mood:  Euthymic  Affect:  Appropriate  Thought Process:  Goal Directed and Descriptions of Associations: Intact  Orientation:  Full (Time, Place, and Person)  Thought Content:  Logical  Suicidal Thoughts:  No  Homicidal Thoughts:  No  Memory:  Immediate;   Fair Recent;   Fair Remote;   Fair  Judgement:  Fair  Insight:  Fair  Psychomotor Activity:  Normal  Concentration:  Fair  Recall:  [employer] of Knowledge:Fair  Language: Fair  Akathisia:  No  Handed:  Right  AIMS (if indicated):   0  Assets:  Desire for Improvement  Sleep:  Number of Hours: 5.25  Cognition: WNL  ADL's:  Intact   Mental Status Per Nursing Assessment::   On Admission:  NA  Demographic Factors:  Male and Caucasian  Loss Factors: Decrease in vocational status  Historical Factors: Impulsivity  Risk Reduction Factors:   Positive social support  Continued Clinical Symptoms:  Alcohol/Substance Abuse/Dependencies Previous Psychiatric Diagnoses and Treatments Medical Diagnoses and Treatments/Surgeries  Cognitive Features That Contribute To Risk:  None    Suicide Risk:  Minimal: No identifiable suicidal ideation.  Patients presenting with no risk factors but with morbid ruminations; may be classified as minimal risk based on the severity of the depressive symptoms  Follow-up Information    [facility] Follow up on 01/04/2016.   Why:  Friday at 2:00 with Dr Nile Riggs information: [place] [address] [phone]  Plan Of Care/Follow-up recommendations:  Activity:  no restrictions Diet:  regular Tests:  as needed Other:  follow up with aftercare  Diann Bangerter, MD 12/31/2015, 9:49 AM

## 2015-12-31 NOTE — BHH Group Notes (Signed)
[facility] LCSW Aftercare Discharge Planning Group Note   12/31/2015 1:09 PM  Participation Quality:  Appropriate, quiet  Mood/Affect:  Appropriate  Plan for Discharge/Comments:  Return home, follow up w Dr Darleene Cleaver  Transportation Means: wife  Supports:  wife  Beverely Pace

## 2016-01-04 DIAGNOSIS — F3132 Bipolar disorder, current episode depressed, moderate: Secondary | ICD-10-CM | POA: Diagnosis not present

## 2016-01-04 DIAGNOSIS — F411 Generalized anxiety disorder: Secondary | ICD-10-CM | POA: Diagnosis not present

## 2016-01-07 DIAGNOSIS — E039 Hypothyroidism, unspecified: Secondary | ICD-10-CM | POA: Diagnosis not present

## 2016-01-07 DIAGNOSIS — J449 Chronic obstructive pulmonary disease, unspecified: Secondary | ICD-10-CM | POA: Diagnosis not present

## 2016-01-07 DIAGNOSIS — F419 Anxiety disorder, unspecified: Secondary | ICD-10-CM | POA: Diagnosis not present

## 2016-01-07 DIAGNOSIS — F319 Bipolar disorder, unspecified: Secondary | ICD-10-CM | POA: Diagnosis not present

## 2016-01-07 DIAGNOSIS — M545 Low back pain: Secondary | ICD-10-CM | POA: Diagnosis not present

## 2016-01-07 DIAGNOSIS — G8929 Other chronic pain: Secondary | ICD-10-CM | POA: Diagnosis not present

## 2016-01-07 DIAGNOSIS — K219 Gastro-esophageal reflux disease without esophagitis: Secondary | ICD-10-CM | POA: Diagnosis not present

## 2016-01-07 DIAGNOSIS — E7800 Pure hypercholesterolemia, unspecified: Secondary | ICD-10-CM | POA: Diagnosis not present

## 2016-01-07 DIAGNOSIS — R109 Unspecified abdominal pain: Secondary | ICD-10-CM | POA: Diagnosis not present

## 2016-01-08 DIAGNOSIS — R109 Unspecified abdominal pain: Secondary | ICD-10-CM | POA: Diagnosis not present

## 2016-01-08 DIAGNOSIS — M545 Low back pain: Secondary | ICD-10-CM | POA: Diagnosis not present

## 2016-01-08 DIAGNOSIS — E7800 Pure hypercholesterolemia, unspecified: Secondary | ICD-10-CM | POA: Diagnosis not present

## 2016-01-08 DIAGNOSIS — G8929 Other chronic pain: Secondary | ICD-10-CM | POA: Diagnosis not present

## 2016-01-08 DIAGNOSIS — F319 Bipolar disorder, unspecified: Secondary | ICD-10-CM | POA: Diagnosis not present

## 2016-01-08 DIAGNOSIS — K219 Gastro-esophageal reflux disease without esophagitis: Secondary | ICD-10-CM | POA: Diagnosis not present

## 2016-01-08 DIAGNOSIS — E039 Hypothyroidism, unspecified: Secondary | ICD-10-CM | POA: Diagnosis not present

## 2016-01-08 DIAGNOSIS — F419 Anxiety disorder, unspecified: Secondary | ICD-10-CM | POA: Diagnosis not present

## 2016-01-08 DIAGNOSIS — J449 Chronic obstructive pulmonary disease, unspecified: Secondary | ICD-10-CM | POA: Diagnosis not present

## 2016-01-09 DIAGNOSIS — F1120 Opioid dependence, uncomplicated: Secondary | ICD-10-CM | POA: Diagnosis not present

## 2016-01-09 DIAGNOSIS — M47816 Spondylosis without myelopathy or radiculopathy, lumbar region: Secondary | ICD-10-CM | POA: Diagnosis not present

## 2016-01-09 DIAGNOSIS — M5136 Other intervertebral disc degeneration, lumbar region: Secondary | ICD-10-CM | POA: Diagnosis not present

## 2016-01-09 DIAGNOSIS — G894 Chronic pain syndrome: Secondary | ICD-10-CM | POA: Diagnosis not present

## 2016-01-16 DIAGNOSIS — Z760 Encounter for issue of repeat prescription: Secondary | ICD-10-CM | POA: Diagnosis not present

## 2016-01-29 DIAGNOSIS — T85192A Other mechanical complication of implanted electronic neurostimulator (electrode) of spinal cord, initial encounter: Secondary | ICD-10-CM | POA: Diagnosis not present

## 2016-01-30 ENCOUNTER — Other Ambulatory Visit: Payer: Self-pay | Admitting: Anesthesiology

## 2016-02-07 ENCOUNTER — Encounter (HOSPITAL_COMMUNITY): Payer: Self-pay | Admitting: *Deleted

## 2016-02-07 NOTE — H&P (Signed)
[name] is an 50 y.o. male.   Chief Complaint: generator failure HPI: patient who underwent [employer]. Jude SCS system placement with Dr. Hal Neer about 8 years ago, now has difficulty charging his system, and the IPG does not hold charge well. With IPG technical failure, but excellent therapeutic benefit, patient has requested replacement of his IPG  Past Medical History:  Diagnosis Date  . Anxiety   . Arthritis   . Bipolar affective disorder ([place])   . Chronic back pain   . DDD (degenerative disc disease)   . Depression   . DJD (degenerative joint disease)   . Elevated liver function tests   . Fibromyalgia   . GERD (gastroesophageal reflux disease)   . Head injury   . Hyperlipidemia   . Hypothyroidism   . IBS (irritable bowel syndrome)   . Inguinal hernia    left  . Myofascial pain syndrome   . Osteopenia   . Sleep apnea   . Tibial plateau fracture, left     Past Surgical History:  Procedure Laterality Date  . CARDIAC CATHETERIZATION N/A 08/31/2015   Procedure: Left Heart Cath and Coronary Angiography;  Surgeon: Leonie Man, MD;  Location: [place] [facility];  Service: Cardiovascular;  Laterality: N/A;  . Carotid Dopplers Bilateral 02/20/2015   [facility]: Mild, less than 39% left and right internal carotid artery stenosis. No significant plaque burden  . CERVICAL DISC SURGERY     C5-7  . COLONOSCOPY    . COLONOSCOPY    . ESOPHAGOGASTRODUODENOSCOPY    . FASCIOTOMY Left 10/20/2013   Procedure: LEFT ANTERIOR COMPARTMENT FACSCIOTOMY;  Surgeon: Rozanna Box, MD;  Location: [place];  Service: Orthopedics;  Laterality: Left;  . INGUINAL HERNIA REPAIR Left   . Nuclear Stress Test  03/06/2015   [facility]: Normal EKG. Low normal EF (49%) normal regional wall motion. No evidence of ischemia or infarction.  . ORIF TIBIA PLATEAU Left 10/20/2013   Procedure: OPEN REDUCTION INTERNAL FIXATION (ORIF) LEFT TIBIAL PLATEAU;  Surgeon: Rozanna Box, MD;   Location: [place];  Service: Orthopedics;  Laterality: Left;  . SEPTOPLASTY    . SPINAL CORD STIMULATOR IMPLANT    . TRANSTHORACIC ECHOCARDIOGRAM  02/20/2015   [facility]: Mild concentric LVH. EF 55-60%. Normal regional wall motion. Mild to moderate TR with no significant pulmonary hypertension.    Family History  Problem Relation Age of Onset  . Heart disease Mother   . Hypertension Mother   . Sudden death Mother     Presumably cardiac  . Hyperlipidemia Father   . Heart attack Father 38    At least 5 MIs. Had CABG.  . Heart failure Father   . Diabetes Father   . Hypertension Father   . Kidney disease Father   . Diabetes Brother   . Lung cancer Maternal Grandmother   . Diabetes Paternal Grandmother   . Sudden death Paternal Grandmother     Unclear etiology  . Diabetes    . Kidney disease    . Kidney disease Brother   . Hypertension Brother   . Sudden death Brother     Thought to be related to heart disease  . Throat cancer Brother   . Colon cancer Neg Hx    Social History:  reports that he has been smoking Cigarettes.  He has a 33.00 pack-year smoking history. He quit smokeless tobacco use about a year ago. He reports that he does not drink alcohol  or use drugs.  Allergies:  Allergies  Allergen Reactions  . Zolpidem Tartrate Other (See Comments)    Hallucinations and sleep walks  . Demerol [Meperidine] Itching and Rash    Medications Prior to Admission  Medication Sig Dispense Refill  . aspirin EC 81 MG tablet Take 81 mg by mouth daily.    . busPIRone (BUSPAR) 7.5 MG tablet Take 7.5 mg by mouth 2 (two) times daily.    Marland Kitchen esomeprazole (NEXIUM) 40 MG capsule Take 40 mg by mouth daily at 12 noon.    . gabapentin (NEURONTIN) 300 MG capsule Take 300 mg by mouth 3 (three) times daily.    . hydrOXYzine (ATARAX/VISTARIL) 25 MG tablet Take 25 mg by mouth 3 (three) times daily.    Marland Kitchen lamoTRIgine (LAMICTAL) 25 MG tablet Take 1 tablet (25 mg total) by mouth daily.  (Patient taking differently: Take 50 mg by mouth daily. ) 30 tablet 0  . levothyroxine (SYNTHROID, LEVOTHROID) 75 MCG tablet Take 75 mcg by mouth daily before breakfast.    . metoprolol tartrate (LOPRESSOR) 25 MG tablet Take 12.5 mg by mouth 2 (two) times daily.    Marland Kitchen OLANZapine (ZYPREXA) 7.5 MG tablet Take 7.5 mg by mouth at bedtime.    . Oxycodone HCl 10 MG TABS Take 10 mg by mouth 4 (four) times daily.    Marland Kitchen tiZANidine (ZANAFLEX) 2 MG tablet Take 2 mg by mouth 2 (two) times daily.      Results for orders placed or performed during the hospital encounter of 02/08/16 (from the past 48 hour(s))  Glucose, capillary     Status: Abnormal   Collection Time: 02/08/16  5:54 AM  Result Value Ref Range   Glucose-Capillary 103 (H) 65 - 99 mg/dL  CBC     Status: None   Collection Time: 02/08/16  6:42 AM  Result Value Ref Range   WBC 8.6 4.0 - 10.5 K/uL   RBC 4.71 4.22 - 5.81 MIL/uL   Hemoglobin 13.8 13.0 - 17.0 g/dL   HCT 40.5 39.0 - 52.0 %   MCV 86.0 78.0 - 100.0 fL   MCH 29.3 26.0 - 34.0 pg   MCHC 34.1 30.0 - 36.0 g/dL   RDW 12.5 11.5 - 15.5 %   Platelets 268 150 - 400 K/uL   No results found.  Review of Systems  Constitutional: Negative.   HENT: Negative.   Eyes: Negative.   Respiratory: Negative.   Cardiovascular: Negative.   Gastrointestinal: Negative.   Genitourinary: Negative.   Musculoskeletal: Positive for back pain. Negative for joint pain and myalgias.  Skin: Negative.   Neurological: Negative.   Endo/Heme/Allergies: Negative.   Psychiatric/Behavioral: Negative.     Blood pressure 133/82, pulse 64, temperature 97.7 F (36.5 C), temperature source Oral, resp. rate 18, height 6' (1.829 m), weight 106.6 kg (235 lb), SpO2 95 %. Physical Exam  Constitutional: He is oriented to person, place, and time. He appears well-developed and well-nourished.  HENT:  Head: Normocephalic and atraumatic.  Eyes: EOM are normal. Pupils are equal, round, and reactive to light.  Neck: Normal  range of motion.  Cardiovascular: Normal rate and regular rhythm.   Respiratory: Effort normal.  Neurological: He is alert and oriented to person, place, and time.  Skin: Skin is warm and dry.  Psychiatric: He has a normal mood and affect. His behavior is normal.     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  Bonna Gains, MD 02/08/2016,  7:37 AM

## 2016-02-07 NOTE — Progress Notes (Signed)
Mr [name] has had a history of chest pain in the past- not currently.  Patient had a cardiac cath 08/31/15. Patient has has a stress test and echo in the past year.

## 2016-02-08 ENCOUNTER — Ambulatory Visit (HOSPITAL_COMMUNITY)
Admission: RE | Admit: 2016-02-08 | Discharge: 2016-02-08 | Disposition: A | Discharge status: Home / Self Care | Payer: 59 | Source: Ambulatory Visit | Attending: Anesthesiology | Admitting: Anesthesiology

## 2016-02-08 ENCOUNTER — Encounter (HOSPITAL_COMMUNITY): Payer: Self-pay | Admitting: *Deleted

## 2016-02-08 ENCOUNTER — Ambulatory Visit (HOSPITAL_COMMUNITY): Payer: 59 | Admitting: Anesthesiology

## 2016-02-08 ENCOUNTER — Encounter (HOSPITAL_COMMUNITY)
Admission: RE | Disposition: A | Discharge status: Home / Self Care | Payer: Self-pay | Source: Ambulatory Visit | Attending: Anesthesiology

## 2016-02-08 DIAGNOSIS — Z7982 Long term (current) use of aspirin: Secondary | ICD-10-CM | POA: Diagnosis not present

## 2016-02-08 DIAGNOSIS — F419 Anxiety disorder, unspecified: Secondary | ICD-10-CM | POA: Diagnosis not present

## 2016-02-08 DIAGNOSIS — M961 Postlaminectomy syndrome, not elsewhere classified: Secondary | ICD-10-CM | POA: Diagnosis not present

## 2016-02-08 DIAGNOSIS — Z79899 Other long term (current) drug therapy: Secondary | ICD-10-CM | POA: Diagnosis not present

## 2016-02-08 DIAGNOSIS — G8929 Other chronic pain: Secondary | ICD-10-CM | POA: Insufficient documentation

## 2016-02-08 DIAGNOSIS — Y831 Surgical operation with implant of artificial internal device as the cause of abnormal reaction of the patient, or of later complication, without mention of misadventure at the time of the procedure: Secondary | ICD-10-CM | POA: Insufficient documentation

## 2016-02-08 DIAGNOSIS — M797 Fibromyalgia: Secondary | ICD-10-CM | POA: Diagnosis not present

## 2016-02-08 DIAGNOSIS — E039 Hypothyroidism, unspecified: Secondary | ICD-10-CM | POA: Diagnosis not present

## 2016-02-08 DIAGNOSIS — Z87891 Personal history of nicotine dependence: Secondary | ICD-10-CM | POA: Diagnosis not present

## 2016-02-08 DIAGNOSIS — G4730 Sleep apnea, unspecified: Secondary | ICD-10-CM | POA: Insufficient documentation

## 2016-02-08 DIAGNOSIS — K219 Gastro-esophageal reflux disease without esophagitis: Secondary | ICD-10-CM | POA: Insufficient documentation

## 2016-02-08 DIAGNOSIS — K589 Irritable bowel syndrome without diarrhea: Secondary | ICD-10-CM | POA: Diagnosis not present

## 2016-02-08 DIAGNOSIS — T85113A Breakdown (mechanical) of implanted electronic neurostimulator, generator, initial encounter: Secondary | ICD-10-CM | POA: Insufficient documentation

## 2016-02-08 DIAGNOSIS — M549 Dorsalgia, unspecified: Secondary | ICD-10-CM | POA: Diagnosis not present

## 2016-02-08 DIAGNOSIS — F319 Bipolar disorder, unspecified: Secondary | ICD-10-CM | POA: Diagnosis not present

## 2016-02-08 DIAGNOSIS — T85193A Other mechanical complication of implanted electronic neurostimulator, generator, initial encounter: Secondary | ICD-10-CM | POA: Diagnosis not present

## 2016-02-08 HISTORY — DX: Insomnia, unspecified: G47.00

## 2016-02-08 HISTORY — DX: Concussion with loss of consciousness status unknown, initial encounter: S06.0XAA

## 2016-02-08 HISTORY — DX: Reserved for inherently not codable concepts without codable children: IMO0001

## 2016-02-08 HISTORY — DX: Chronic kidney disease, unspecified: N18.9

## 2016-02-08 HISTORY — DX: Concussion with loss of consciousness of unspecified duration, initial encounter: S06.0X9A

## 2016-02-08 HISTORY — PX: SPINAL CORD STIMULATOR BATTERY EXCHANGE: SHX6202

## 2016-02-08 LAB — CBC
HEMATOCRIT: 40.5 % (ref 39.0–52.0)
HEMOGLOBIN: 13.8 g/dL (ref 13.0–17.0)
MCH: 29.3 pg (ref 26.0–34.0)
MCHC: 34.1 g/dL (ref 30.0–36.0)
MCV: 86 fL (ref 78.0–100.0)
Platelets: 268 10*3/uL (ref 150–400)
RBC: 4.71 MIL/uL (ref 4.22–5.81)
RDW: 12.5 % (ref 11.5–15.5)
WBC: 8.6 10*3/uL (ref 4.0–10.5)

## 2016-02-08 LAB — BASIC METABOLIC PANEL
ANION GAP: 9 (ref 5–15)
BUN: 12 mg/dL (ref 6–20)
CHLORIDE: 104 mmol/L (ref 101–111)
CO2: 25 mmol/L (ref 22–32)
Calcium: 9.2 mg/dL (ref 8.9–10.3)
Creatinine, Ser: 1.12 mg/dL (ref 0.61–1.24)
GFR calc Af Amer: >60 mL/min (ref >60)
Glucose, Bld: 86 mg/dL (ref 65–99)
POTASSIUM: 4.5 mmol/L (ref 3.5–5.1)
SODIUM: 138 mmol/L (ref 135–145)

## 2016-02-08 LAB — SURGICAL PCR SCREEN
MRSA, PCR: NEGATIVE
Staphylococcus aureus: NEGATIVE

## 2016-02-08 LAB — GLUCOSE, CAPILLARY: Glucose-Capillary: 103 mg/dL — ABNORMAL HIGH (ref 65–99)

## 2016-02-08 SURGERY — SPINAL CORD STIMULATOR BATTERY EXCHANGE
Anesthesia: Monitor Anesthesia Care

## 2016-02-08 MED ORDER — ONDANSETRON HCL 4 MG/2ML IJ SOLN: 4.0000 mg | Freq: Once | INTRAMUSCULAR | Status: DC | PRN

## 2016-02-08 MED ORDER — CHLORHEXIDINE GLUCONATE CLOTH 2 % EX PADS: 6.0000 | MEDICATED_PAD | Freq: Once | CUTANEOUS | Status: DC

## 2016-02-08 MED ORDER — MIDAZOLAM HCL 2 MG/2ML IJ SOLN
INTRAMUSCULAR | Status: AC
  Filled 2016-02-08: qty 2

## 2016-02-08 MED ORDER — MUPIROCIN 2 % EX OINT
TOPICAL_OINTMENT | CUTANEOUS | Status: AC
  Filled 2016-02-08: qty 22

## 2016-02-08 MED ORDER — 0.9 % SODIUM CHLORIDE (POUR BTL) OPTIME
TOPICAL | Status: DC | PRN
  Administered 2016-02-08: 1000 mL

## 2016-02-08 MED ORDER — OXYCODONE HCL 10 MG PO TABS: ORAL_TABLET | ORAL | 0 refills | Status: DC

## 2016-02-08 MED ORDER — FENTANYL CITRATE (PF) 100 MCG/2ML IJ SOLN
INTRAMUSCULAR | Status: AC
  Filled 2016-02-08: qty 4

## 2016-02-08 MED ORDER — MIDAZOLAM HCL 5 MG/5ML IJ SOLN
INTRAMUSCULAR | Status: DC | PRN
Start: 2016-02-08 — End: 2016-02-08
  Administered 2016-02-08: 2 mg via INTRAVENOUS

## 2016-02-08 MED ORDER — PROPOFOL 500 MG/50ML IV EMUL
INTRAVENOUS | Status: DC | PRN
  Administered 2016-02-08: 50 ug/kg/min via INTRAVENOUS

## 2016-02-08 MED ORDER — LIDOCAINE-EPINEPHRINE 0.5 %-1:200000 IJ SOLN
INTRAMUSCULAR | Status: DC | PRN
  Administered 2016-02-08: 28 mL

## 2016-02-08 MED ORDER — HYDROMORPHONE HCL 1 MG/ML IJ SOLN
0.2500 mg | INTRAMUSCULAR | Status: DC | PRN
  Administered 2016-02-08 (×2): 0.5 mg via INTRAVENOUS

## 2016-02-08 MED ORDER — LACTATED RINGERS IV SOLN
INTRAVENOUS | Status: DC | PRN
  Administered 2016-02-08: 07:00:00 via INTRAVENOUS

## 2016-02-08 MED ORDER — FENTANYL CITRATE (PF) 100 MCG/2ML IJ SOLN
INTRAMUSCULAR | Status: DC | PRN
  Administered 2016-02-08: 50 ug via INTRAVENOUS

## 2016-02-08 MED ORDER — ONDANSETRON HCL 4 MG/2ML IJ SOLN
INTRAMUSCULAR | Status: DC | PRN
  Administered 2016-02-08: 4 mg via INTRAVENOUS

## 2016-02-08 MED ORDER — CEFAZOLIN SODIUM-DEXTROSE 2-4 GM/100ML-% IV SOLN
2.0000 g | INTRAVENOUS | Status: AC
  Administered 2016-02-08: 2 g via INTRAVENOUS
  Filled 2016-02-08: qty 100

## 2016-02-08 MED ORDER — PROPOFOL 10 MG/ML IV BOLUS
INTRAVENOUS | Status: AC
  Filled 2016-02-08: qty 40

## 2016-02-08 MED ORDER — PROPOFOL 1000 MG/100ML IV EMUL
INTRAVENOUS | Status: AC
  Filled 2016-02-08: qty 100

## 2016-02-08 MED ORDER — OXYCODONE HCL 5 MG/5ML PO SOLN: 5.0000 mg | Freq: Once | ORAL | Status: AC | PRN

## 2016-02-08 MED ORDER — HYDROMORPHONE HCL 1 MG/ML IJ SOLN
INTRAMUSCULAR | Status: AC
  Filled 2016-02-08: qty 1

## 2016-02-08 MED ORDER — OXYCODONE HCL 5 MG PO TABS
ORAL_TABLET | ORAL | Status: AC
  Filled 2016-02-08: qty 1

## 2016-02-08 MED ORDER — MUPIROCIN 2 % EX OINT
1.0000 "application " | TOPICAL_OINTMENT | Freq: Once | CUTANEOUS | Status: AC
  Administered 2016-02-08: 1 via TOPICAL

## 2016-02-08 MED ORDER — SODIUM CHLORIDE 0.9 % IR SOLN
Status: DC | PRN
  Administered 2016-02-08: 07:00:00

## 2016-02-08 MED ORDER — OXYCODONE HCL 5 MG PO TABS
5.0000 mg | ORAL_TABLET | Freq: Once | ORAL | Status: AC | PRN
  Administered 2016-02-08: 5 mg via ORAL

## 2016-02-08 SURGICAL SUPPLY — 32 items
BINDER ABD UNIV 12 45-62 (WOUND CARE) ×1 IMPLANT
BINDER ABDOMINAL 46IN 62IN (WOUND CARE) ×2
BLADE CLIPPER SURG (BLADE) IMPLANT
CHLORAPREP W/TINT 26ML (MISCELLANEOUS) ×2 IMPLANT
DERMABOND ADVANCED (GAUZE/BANDAGES/DRESSINGS) ×1
DERMABOND ADVANCED .7 DNX12 (GAUZE/BANDAGES/DRESSINGS) ×1 IMPLANT
DRAPE LAPAROTOMY 100X72X124 (DRAPES) ×2 IMPLANT
DRSG OPSITE POSTOP 4X6 (GAUZE/BANDAGES/DRESSINGS) ×2 IMPLANT
GLOVE BIOGEL PI IND STRL 7.5 (GLOVE) ×1 IMPLANT
GLOVE BIOGEL PI INDICATOR 7.5 (GLOVE) ×1
GLOVE ECLIPSE 7.5 STRL STRAW (GLOVE) ×2 IMPLANT
GLOVE EXAM NITRILE LRG STRL (GLOVE) IMPLANT
GLOVE EXAM NITRILE XL STR (GLOVE) IMPLANT
GLOVE EXAM NITRILE XS STR PU (GLOVE) IMPLANT
GLOVE INDICATOR 7.5 STRL GRN (GLOVE) ×4 IMPLANT
GOWN STRL REUS W/ TWL LRG LVL3 (GOWN DISPOSABLE) IMPLANT
GOWN STRL REUS W/TWL LRG LVL3 (GOWN DISPOSABLE)
KIT BASIN OR (CUSTOM PROCEDURE TRAY) ×2 IMPLANT
KIT ROOM TURNOVER OR (KITS) ×2 IMPLANT
NEEDLE HYPO 25X1 1.5 SAFETY (NEEDLE) ×2 IMPLANT
PACK LAMINECTOMY NEURO (CUSTOM PROCEDURE TRAY) ×2 IMPLANT
PAD ARMBOARD 7.5X6 YLW CONV (MISCELLANEOUS) ×2 IMPLANT
PULSE GENERATOR PROCLAIM 7ELIT (Neuro Prosthesis/Implant) ×2 IMPLANT
SPONGE LAP 4X18 X RAY DECT (DISPOSABLE) IMPLANT
SUT MNCRL AB 4-0 PS2 18 (SUTURE) ×2 IMPLANT
SUT SILK 2 0 FS (SUTURE) IMPLANT
SUT VIC AB 2-0 CP2 18 (SUTURE) ×4 IMPLANT
SYR EPIDURAL 5ML GLASS (SYRINGE) IMPLANT
SYRINGE 10CC LL (SYRINGE) IMPLANT
TOWEL OR 17X24 6PK STRL BLUE (TOWEL DISPOSABLE) ×2 IMPLANT
TOWEL OR 17X26 10 PK STRL BLUE (TOWEL DISPOSABLE) ×2 IMPLANT
WATER STERILE IRR 1000ML POUR (IV SOLUTION) ×2 IMPLANT

## 2016-02-08 NOTE — Op Note (Signed)
1) lumbar post-laminectomy syndrome  2) chronic pain  POSTOP DX: same as preop PROCEDURES PERFORMED:1) IPG replacement--St. Jude SURGEON:Armie Moren  ASSISTANT: NONE  ANESTHESIA: MAC/TIVA EBL: <10cc  DESCRIPTION OF PROCEDURE: After a discussion of risks, benefits and alternatives, informed consent was obtained. The patient was taken to the OR, general anesthesia induced by the anesthesia team without difficulty, turned prone onto a Jackson table, all pressure points padded, SCD's placed. A timeout was taken to verify the correct patient, position, personnel, availability of appropriate equipment, and administration of perioperative antibiotics.   The lumbar /buttock area overlying the previously placed IPG was widely prepped with chloraprep and draped into a sterile field. The skin and subcutaneous tissues around the patient's previous pocket incision was infiltrated with 0.25% bupivicaine 1:200K epinephrine. The subcutaneous pocket was incised with a 10 blade and using sharp, careful dissection the pocket opened and the IPG delivered onto the field. The pocket was inspected for hemostasis, which was found to be excellent. The leads were removed from the IPG. The pocket was then slightly more developed to accomodate the larger replacement IPG, a Proclaim 7 Elite, SN: [id number].  With good fit established, the incision was copiously irrigated with bacitracin-containing irrigation.The pocket incision was closed with a deeper layer of 2-0 vicryl interrupted sutures, and the skin closed with a running 4-0 subcuticular monocryl suture and dermabond. Sterile dressings were applied. Needle, sponge, and instrument counts were correct x2 at the end of the case.  The patient was then carefully awakened from anesthesia, turned supine, an abdominal binder placed, and the patient taken to the recovery room. COMPLICATIONS: NONE  CONDITION: Stable throughout the course of the procedure and immediately afterward   DISPOSITION: discharge to home. Discussed care with the patient and accompanying family member. Followup in clinic will be scheduled in 10-14 days.

## 2016-02-08 NOTE — Discharge Instructions (Signed)
Dr. Hadleigh Felber Post-Op Orders ° °• Ice Pack - 20 minutes on (in a pillow case), and 20 minutes off. Wear the ice pack UNDER the binder. °• Follow up in office, they will call you for an appointment in 10 days to 2 weeks. °• Increase activity gradually.   °• No lifting anything heavier than a gallon of milk (10 pounds) until seen in the office. °• Advance diet slowly as tolerated. °• Dressing care:  Keep dressing dry for 3 days, and on Post-op day 4, may shower. °• Call for fever, drainage, and redness. °• No swimming or bathing in a bathtub (do not get into standing water). °•  °

## 2016-02-08 NOTE — Transfer of Care (Signed)
Immediate Anesthesia Transfer of Care Note  Patient: [name]  Procedure(s) Performed: Procedure(s): Lumbar spinal cord stimulator implantable pulse generator replacement (N/A)  Patient Location: PACU  Anesthesia Type:MAC  Level of Consciousness: awake, alert  and oriented  Airway & Oxygen Therapy: Patient Spontanous Breathing  Post-op Assessment: Report given to RN and Post -op Vital signs reviewed and stable  Post vital signs: Reviewed and stable  Last Vitals:  Vitals:   02/08/16 0633  BP: 133/82  Pulse: 64  Resp: 18  Temp: 36.5 C    Last Pain:  Vitals:   02/08/16 0705  TempSrc:   PainSc: 4       Patients Stated Pain Goal: 1 ([id number])  Complications: No apparent anesthesia complications

## 2016-02-08 NOTE — Anesthesia Preprocedure Evaluation (Signed)
Anesthesia Evaluation  Patient identified by MRN, date of birth, ID band Patient awake    Reviewed: Allergy & Precautions, NPO status , Patient's Chart, lab work & pertinent test results  Airway Mallampati: II  TM Distance: >3 FB     Dental  (+) Edentulous Upper, Edentulous Lower   Pulmonary former smoker,    breath sounds clear to auscultation       Cardiovascular  Rhythm:Regular     Neuro/Psych    GI/Hepatic   Endo/Other    Renal/GU      Musculoskeletal   Abdominal   Peds  Hematology   Anesthesia Other Findings   Reproductive/Obstetrics                             Anesthesia Physical Anesthesia Plan  ASA: II  Anesthesia Plan: MAC   Post-op Pain Management:    Induction: Intravenous  Airway Management Planned: Natural Airway and Nasal Cannula  Additional Equipment:   Intra-op Plan:   Post-operative Plan:   Informed Consent: I have reviewed the patients History and Physical, chart, labs and discussed the procedure including the risks, benefits and alternatives for the proposed anesthesia with the patient or authorized representative who has indicated his/her understanding and acceptance.     Plan Discussed with: CRNA and Anesthesiologist  Anesthesia Plan Comments:         Anesthesia Quick Evaluation

## 2016-02-08 NOTE — Anesthesia Postprocedure Evaluation (Signed)
Anesthesia Post Note  Patient: [name]  Procedure(s) Performed: Procedure(s) (LRB): Lumbar spinal cord stimulator implantable pulse generator replacement (N/A)  Patient location during evaluation: PACU Anesthesia Type: MAC Level of consciousness: awake, awake and alert and oriented Pain management: pain level controlled Vital Signs Assessment: post-procedure vital signs reviewed and stable Respiratory status: spontaneous breathing, nonlabored ventilation and respiratory function stable Cardiovascular status: blood pressure returned to baseline Postop Assessment: no headache Anesthetic complications: no    Last Vitals:  Vitals:   02/08/16 0915 02/08/16 0917  BP: 131/82 126/76  Pulse: 69 60  Resp: 14 16  Temp: 36.9 C     Last Pain:  Vitals:   02/08/16 0917  TempSrc:   PainSc: 3                  Ashleynicole Mcclees COKER

## 2016-02-11 DIAGNOSIS — T85192A Other mechanical complication of implanted electronic neurostimulator (electrode) of spinal cord, initial encounter: Secondary | ICD-10-CM | POA: Diagnosis not present

## 2016-02-12 ENCOUNTER — Encounter (HOSPITAL_COMMUNITY): Payer: Self-pay | Admitting: Anesthesiology

## 2016-02-12 DIAGNOSIS — F1120 Opioid dependence, uncomplicated: Secondary | ICD-10-CM | POA: Diagnosis not present

## 2016-02-12 DIAGNOSIS — M47816 Spondylosis without myelopathy or radiculopathy, lumbar region: Secondary | ICD-10-CM | POA: Diagnosis not present

## 2016-02-12 DIAGNOSIS — Z79891 Long term (current) use of opiate analgesic: Secondary | ICD-10-CM | POA: Diagnosis not present

## 2016-02-12 DIAGNOSIS — Z720 Tobacco use: Secondary | ICD-10-CM | POA: Diagnosis not present

## 2016-02-12 DIAGNOSIS — M179 Osteoarthritis of knee, unspecified: Secondary | ICD-10-CM | POA: Diagnosis not present

## 2016-02-12 DIAGNOSIS — M5412 Radiculopathy, cervical region: Secondary | ICD-10-CM | POA: Diagnosis not present

## 2016-02-12 DIAGNOSIS — G894 Chronic pain syndrome: Secondary | ICD-10-CM | POA: Diagnosis not present

## 2016-02-12 DIAGNOSIS — M5136 Other intervertebral disc degeneration, lumbar region: Secondary | ICD-10-CM | POA: Diagnosis not present

## 2016-02-21 DIAGNOSIS — S39012A Strain of muscle, fascia and tendon of lower back, initial encounter: Secondary | ICD-10-CM | POA: Diagnosis not present

## 2016-02-26 DIAGNOSIS — T85192A Other mechanical complication of implanted electronic neurostimulator (electrode) of spinal cord, initial encounter: Secondary | ICD-10-CM | POA: Diagnosis not present

## 2016-03-06 DIAGNOSIS — F411 Generalized anxiety disorder: Secondary | ICD-10-CM | POA: Diagnosis not present

## 2016-03-06 DIAGNOSIS — F3132 Bipolar disorder, current episode depressed, moderate: Secondary | ICD-10-CM | POA: Diagnosis not present

## 2016-03-11 DIAGNOSIS — Z79891 Long term (current) use of opiate analgesic: Secondary | ICD-10-CM | POA: Diagnosis not present

## 2016-03-11 DIAGNOSIS — M179 Osteoarthritis of knee, unspecified: Secondary | ICD-10-CM | POA: Diagnosis not present

## 2016-03-11 DIAGNOSIS — M5136 Other intervertebral disc degeneration, lumbar region: Secondary | ICD-10-CM | POA: Diagnosis not present

## 2016-03-20 DIAGNOSIS — E559 Vitamin D deficiency, unspecified: Secondary | ICD-10-CM | POA: Diagnosis not present

## 2016-03-20 DIAGNOSIS — Z23 Encounter for immunization: Secondary | ICD-10-CM | POA: Diagnosis not present

## 2016-03-20 DIAGNOSIS — J069 Acute upper respiratory infection, unspecified: Secondary | ICD-10-CM | POA: Diagnosis not present

## 2016-03-20 DIAGNOSIS — R0602 Shortness of breath: Secondary | ICD-10-CM | POA: Diagnosis not present

## 2016-03-20 DIAGNOSIS — Z0000 Encounter for general adult medical examination without abnormal findings: Secondary | ICD-10-CM | POA: Diagnosis not present

## 2016-03-20 DIAGNOSIS — Z114 Encounter for screening for human immunodeficiency virus [HIV]: Secondary | ICD-10-CM | POA: Diagnosis not present

## 2016-03-21 DIAGNOSIS — R221 Localized swelling, mass and lump, neck: Secondary | ICD-10-CM | POA: Diagnosis not present

## 2016-04-01 DIAGNOSIS — I6529 Occlusion and stenosis of unspecified carotid artery: Secondary | ICD-10-CM | POA: Diagnosis not present

## 2016-04-01 DIAGNOSIS — M79606 Pain in leg, unspecified: Secondary | ICD-10-CM | POA: Diagnosis not present

## 2016-04-01 DIAGNOSIS — I340 Nonrheumatic mitral (valve) insufficiency: Secondary | ICD-10-CM | POA: Diagnosis not present

## 2016-04-07 DIAGNOSIS — M179 Osteoarthritis of knee, unspecified: Secondary | ICD-10-CM | POA: Diagnosis not present

## 2016-04-10 DIAGNOSIS — Z79899 Other long term (current) drug therapy: Secondary | ICD-10-CM | POA: Diagnosis not present

## 2016-04-10 DIAGNOSIS — R590 Localized enlarged lymph nodes: Secondary | ICD-10-CM | POA: Diagnosis not present

## 2016-04-10 DIAGNOSIS — E559 Vitamin D deficiency, unspecified: Secondary | ICD-10-CM | POA: Diagnosis not present

## 2016-04-10 DIAGNOSIS — E782 Mixed hyperlipidemia: Secondary | ICD-10-CM | POA: Diagnosis not present

## 2016-04-17 DIAGNOSIS — N451 Epididymitis: Secondary | ICD-10-CM | POA: Diagnosis not present

## 2016-04-17 DIAGNOSIS — N401 Enlarged prostate with lower urinary tract symptoms: Secondary | ICD-10-CM | POA: Diagnosis not present

## 2016-04-28 DIAGNOSIS — R599 Enlarged lymph nodes, unspecified: Secondary | ICD-10-CM | POA: Diagnosis not present

## 2016-04-29 DIAGNOSIS — F411 Generalized anxiety disorder: Secondary | ICD-10-CM | POA: Diagnosis not present

## 2016-04-29 DIAGNOSIS — F3132 Bipolar disorder, current episode depressed, moderate: Secondary | ICD-10-CM | POA: Diagnosis not present

## 2016-05-06 DIAGNOSIS — Z79891 Long term (current) use of opiate analgesic: Secondary | ICD-10-CM | POA: Diagnosis not present

## 2016-05-06 DIAGNOSIS — Q761 Klippel-Feil syndrome: Secondary | ICD-10-CM | POA: Diagnosis not present

## 2016-05-06 DIAGNOSIS — M47816 Spondylosis without myelopathy or radiculopathy, lumbar region: Secondary | ICD-10-CM | POA: Diagnosis not present

## 2016-05-06 DIAGNOSIS — Z720 Tobacco use: Secondary | ICD-10-CM | POA: Diagnosis not present

## 2016-05-06 DIAGNOSIS — G894 Chronic pain syndrome: Secondary | ICD-10-CM | POA: Diagnosis not present

## 2016-05-06 DIAGNOSIS — M179 Osteoarthritis of knee, unspecified: Secondary | ICD-10-CM | POA: Diagnosis not present

## 2016-05-06 DIAGNOSIS — M5136 Other intervertebral disc degeneration, lumbar region: Secondary | ICD-10-CM | POA: Diagnosis not present

## 2016-05-06 DIAGNOSIS — F419 Anxiety disorder, unspecified: Secondary | ICD-10-CM | POA: Diagnosis not present

## 2016-05-06 DIAGNOSIS — F1120 Opioid dependence, uncomplicated: Secondary | ICD-10-CM | POA: Diagnosis not present

## 2016-05-15 DIAGNOSIS — N401 Enlarged prostate with lower urinary tract symptoms: Secondary | ICD-10-CM | POA: Diagnosis not present

## 2016-05-15 DIAGNOSIS — R351 Nocturia: Secondary | ICD-10-CM | POA: Diagnosis not present

## 2016-05-19 DIAGNOSIS — E782 Mixed hyperlipidemia: Secondary | ICD-10-CM | POA: Diagnosis not present

## 2016-05-19 DIAGNOSIS — R590 Localized enlarged lymph nodes: Secondary | ICD-10-CM | POA: Diagnosis not present

## 2016-05-19 DIAGNOSIS — M6282 Rhabdomyolysis: Secondary | ICD-10-CM | POA: Diagnosis not present

## 2016-05-19 DIAGNOSIS — I6529 Occlusion and stenosis of unspecified carotid artery: Secondary | ICD-10-CM | POA: Diagnosis not present

## 2016-05-26 DIAGNOSIS — F319 Bipolar disorder, unspecified: Secondary | ICD-10-CM | POA: Diagnosis not present

## 2016-05-28 DIAGNOSIS — F319 Bipolar disorder, unspecified: Secondary | ICD-10-CM | POA: Diagnosis not present

## 2016-05-30 DIAGNOSIS — F319 Bipolar disorder, unspecified: Secondary | ICD-10-CM | POA: Diagnosis not present

## 2016-06-02 DIAGNOSIS — F319 Bipolar disorder, unspecified: Secondary | ICD-10-CM | POA: Diagnosis not present

## 2016-06-04 DIAGNOSIS — M179 Osteoarthritis of knee, unspecified: Secondary | ICD-10-CM | POA: Diagnosis not present

## 2016-06-04 DIAGNOSIS — Z720 Tobacco use: Secondary | ICD-10-CM | POA: Diagnosis not present

## 2016-06-04 DIAGNOSIS — F419 Anxiety disorder, unspecified: Secondary | ICD-10-CM | POA: Diagnosis not present

## 2016-06-04 DIAGNOSIS — M5136 Other intervertebral disc degeneration, lumbar region: Secondary | ICD-10-CM | POA: Diagnosis not present

## 2016-06-04 DIAGNOSIS — M47816 Spondylosis without myelopathy or radiculopathy, lumbar region: Secondary | ICD-10-CM | POA: Diagnosis not present

## 2016-06-04 DIAGNOSIS — G894 Chronic pain syndrome: Secondary | ICD-10-CM | POA: Diagnosis not present

## 2016-06-04 DIAGNOSIS — M25551 Pain in right hip: Secondary | ICD-10-CM | POA: Diagnosis not present

## 2016-06-04 DIAGNOSIS — F1120 Opioid dependence, uncomplicated: Secondary | ICD-10-CM | POA: Diagnosis not present

## 2016-06-04 DIAGNOSIS — F319 Bipolar disorder, unspecified: Secondary | ICD-10-CM | POA: Diagnosis not present

## 2016-06-04 DIAGNOSIS — Z79891 Long term (current) use of opiate analgesic: Secondary | ICD-10-CM | POA: Diagnosis not present

## 2016-06-06 DIAGNOSIS — F319 Bipolar disorder, unspecified: Secondary | ICD-10-CM | POA: Diagnosis not present

## 2016-06-09 DIAGNOSIS — F319 Bipolar disorder, unspecified: Secondary | ICD-10-CM | POA: Diagnosis not present

## 2016-06-11 DIAGNOSIS — F319 Bipolar disorder, unspecified: Secondary | ICD-10-CM | POA: Diagnosis not present

## 2016-06-13 DIAGNOSIS — F319 Bipolar disorder, unspecified: Secondary | ICD-10-CM | POA: Diagnosis not present

## 2016-06-17 DIAGNOSIS — F411 Generalized anxiety disorder: Secondary | ICD-10-CM | POA: Diagnosis not present

## 2016-06-17 DIAGNOSIS — F3132 Bipolar disorder, current episode depressed, moderate: Secondary | ICD-10-CM | POA: Diagnosis not present

## 2016-06-20 DIAGNOSIS — F319 Bipolar disorder, unspecified: Secondary | ICD-10-CM | POA: Diagnosis not present

## 2016-06-23 DIAGNOSIS — F319 Bipolar disorder, unspecified: Secondary | ICD-10-CM | POA: Diagnosis not present

## 2016-06-25 DIAGNOSIS — F319 Bipolar disorder, unspecified: Secondary | ICD-10-CM | POA: Diagnosis not present

## 2016-06-27 DIAGNOSIS — F319 Bipolar disorder, unspecified: Secondary | ICD-10-CM | POA: Diagnosis not present

## 2016-06-30 DIAGNOSIS — F319 Bipolar disorder, unspecified: Secondary | ICD-10-CM | POA: Diagnosis not present

## 2016-07-02 DIAGNOSIS — F319 Bipolar disorder, unspecified: Secondary | ICD-10-CM | POA: Diagnosis not present

## 2016-07-04 DIAGNOSIS — F319 Bipolar disorder, unspecified: Secondary | ICD-10-CM | POA: Diagnosis not present

## 2016-07-07 DIAGNOSIS — F319 Bipolar disorder, unspecified: Secondary | ICD-10-CM | POA: Diagnosis not present

## 2016-07-09 DIAGNOSIS — F319 Bipolar disorder, unspecified: Secondary | ICD-10-CM | POA: Diagnosis not present

## 2016-07-11 DIAGNOSIS — F319 Bipolar disorder, unspecified: Secondary | ICD-10-CM | POA: Diagnosis not present

## 2016-07-14 DIAGNOSIS — F319 Bipolar disorder, unspecified: Secondary | ICD-10-CM | POA: Diagnosis not present

## 2016-07-16 DIAGNOSIS — F319 Bipolar disorder, unspecified: Secondary | ICD-10-CM | POA: Diagnosis not present

## 2016-07-18 DIAGNOSIS — M179 Osteoarthritis of knee, unspecified: Secondary | ICD-10-CM | POA: Diagnosis not present

## 2016-07-18 DIAGNOSIS — G894 Chronic pain syndrome: Secondary | ICD-10-CM | POA: Diagnosis not present

## 2016-07-18 DIAGNOSIS — Z79891 Long term (current) use of opiate analgesic: Secondary | ICD-10-CM | POA: Diagnosis not present

## 2016-07-18 DIAGNOSIS — M5136 Other intervertebral disc degeneration, lumbar region: Secondary | ICD-10-CM | POA: Diagnosis not present

## 2016-07-18 DIAGNOSIS — M25551 Pain in right hip: Secondary | ICD-10-CM | POA: Diagnosis not present

## 2016-07-18 DIAGNOSIS — F319 Bipolar disorder, unspecified: Secondary | ICD-10-CM | POA: Diagnosis not present

## 2016-07-18 DIAGNOSIS — Z720 Tobacco use: Secondary | ICD-10-CM | POA: Diagnosis not present

## 2016-07-18 DIAGNOSIS — F1120 Opioid dependence, uncomplicated: Secondary | ICD-10-CM | POA: Diagnosis not present

## 2016-07-18 DIAGNOSIS — M47816 Spondylosis without myelopathy or radiculopathy, lumbar region: Secondary | ICD-10-CM | POA: Diagnosis not present

## 2016-07-18 DIAGNOSIS — F419 Anxiety disorder, unspecified: Secondary | ICD-10-CM | POA: Diagnosis not present

## 2016-07-21 DIAGNOSIS — F319 Bipolar disorder, unspecified: Secondary | ICD-10-CM | POA: Diagnosis not present

## 2016-07-23 DIAGNOSIS — F319 Bipolar disorder, unspecified: Secondary | ICD-10-CM | POA: Diagnosis not present

## 2016-07-25 DIAGNOSIS — F319 Bipolar disorder, unspecified: Secondary | ICD-10-CM | POA: Diagnosis not present

## 2016-07-28 DIAGNOSIS — F319 Bipolar disorder, unspecified: Secondary | ICD-10-CM | POA: Diagnosis not present

## 2016-07-30 DIAGNOSIS — F319 Bipolar disorder, unspecified: Secondary | ICD-10-CM | POA: Diagnosis not present

## 2016-08-01 DIAGNOSIS — F319 Bipolar disorder, unspecified: Secondary | ICD-10-CM | POA: Diagnosis not present

## 2016-08-04 DIAGNOSIS — N401 Enlarged prostate with lower urinary tract symptoms: Secondary | ICD-10-CM | POA: Diagnosis not present

## 2016-08-04 DIAGNOSIS — F319 Bipolar disorder, unspecified: Secondary | ICD-10-CM | POA: Diagnosis not present

## 2016-08-04 DIAGNOSIS — R1032 Left lower quadrant pain: Secondary | ICD-10-CM | POA: Diagnosis not present

## 2016-08-06 DIAGNOSIS — F319 Bipolar disorder, unspecified: Secondary | ICD-10-CM | POA: Diagnosis not present

## 2016-08-07 DIAGNOSIS — F411 Generalized anxiety disorder: Secondary | ICD-10-CM | POA: Diagnosis not present

## 2016-08-07 DIAGNOSIS — F3132 Bipolar disorder, current episode depressed, moderate: Secondary | ICD-10-CM | POA: Diagnosis not present

## 2016-08-08 DIAGNOSIS — F319 Bipolar disorder, unspecified: Secondary | ICD-10-CM | POA: Diagnosis not present

## 2016-08-11 DIAGNOSIS — R2689 Other abnormalities of gait and mobility: Secondary | ICD-10-CM | POA: Diagnosis not present

## 2016-08-11 DIAGNOSIS — M545 Low back pain: Secondary | ICD-10-CM | POA: Diagnosis not present

## 2016-08-11 DIAGNOSIS — F319 Bipolar disorder, unspecified: Secondary | ICD-10-CM | POA: Diagnosis not present

## 2016-08-11 DIAGNOSIS — M6281 Muscle weakness (generalized): Secondary | ICD-10-CM | POA: Diagnosis not present

## 2016-08-13 DIAGNOSIS — F319 Bipolar disorder, unspecified: Secondary | ICD-10-CM | POA: Diagnosis not present

## 2016-08-15 DIAGNOSIS — F319 Bipolar disorder, unspecified: Secondary | ICD-10-CM | POA: Diagnosis not present

## 2016-08-18 DIAGNOSIS — Z79891 Long term (current) use of opiate analgesic: Secondary | ICD-10-CM | POA: Diagnosis not present

## 2016-08-18 DIAGNOSIS — M25551 Pain in right hip: Secondary | ICD-10-CM | POA: Diagnosis not present

## 2016-08-18 DIAGNOSIS — M47816 Spondylosis without myelopathy or radiculopathy, lumbar region: Secondary | ICD-10-CM | POA: Diagnosis not present

## 2016-08-18 DIAGNOSIS — G894 Chronic pain syndrome: Secondary | ICD-10-CM | POA: Diagnosis not present

## 2016-08-18 DIAGNOSIS — M179 Osteoarthritis of knee, unspecified: Secondary | ICD-10-CM | POA: Diagnosis not present

## 2016-08-18 DIAGNOSIS — M5136 Other intervertebral disc degeneration, lumbar region: Secondary | ICD-10-CM | POA: Diagnosis not present

## 2016-08-18 DIAGNOSIS — F419 Anxiety disorder, unspecified: Secondary | ICD-10-CM | POA: Diagnosis not present

## 2016-08-18 DIAGNOSIS — Z720 Tobacco use: Secondary | ICD-10-CM | POA: Diagnosis not present

## 2016-08-18 DIAGNOSIS — F319 Bipolar disorder, unspecified: Secondary | ICD-10-CM | POA: Diagnosis not present

## 2016-08-18 DIAGNOSIS — F1120 Opioid dependence, uncomplicated: Secondary | ICD-10-CM | POA: Diagnosis not present

## 2016-08-20 DIAGNOSIS — F319 Bipolar disorder, unspecified: Secondary | ICD-10-CM | POA: Diagnosis not present

## 2016-08-22 DIAGNOSIS — F319 Bipolar disorder, unspecified: Secondary | ICD-10-CM | POA: Diagnosis not present

## 2016-08-22 DIAGNOSIS — R4182 Altered mental status, unspecified: Secondary | ICD-10-CM | POA: Diagnosis not present

## 2016-08-22 DIAGNOSIS — M6282 Rhabdomyolysis: Secondary | ICD-10-CM | POA: Diagnosis not present

## 2016-08-22 DIAGNOSIS — T50901A Poisoning by unspecified drugs, medicaments and biological substances, accidental (unintentional), initial encounter: Secondary | ICD-10-CM | POA: Diagnosis not present

## 2016-08-22 DIAGNOSIS — T50904A Poisoning by unspecified drugs, medicaments and biological substances, undetermined, initial encounter: Secondary | ICD-10-CM | POA: Diagnosis not present

## 2016-08-22 DIAGNOSIS — T43591A Poisoning by other antipsychotics and neuroleptics, accidental (unintentional), initial encounter: Secondary | ICD-10-CM | POA: Diagnosis not present

## 2016-08-22 DIAGNOSIS — T438X1A Poisoning by other psychotropic drugs, accidental (unintentional), initial encounter: Secondary | ICD-10-CM | POA: Diagnosis not present

## 2016-08-22 DIAGNOSIS — Z452 Encounter for adjustment and management of vascular access device: Secondary | ICD-10-CM | POA: Diagnosis not present

## 2016-08-22 DIAGNOSIS — N179 Acute kidney failure, unspecified: Secondary | ICD-10-CM | POA: Diagnosis not present

## 2016-08-22 DIAGNOSIS — G9341 Metabolic encephalopathy: Secondary | ICD-10-CM | POA: Diagnosis not present

## 2016-08-23 ENCOUNTER — Inpatient Hospital Stay (HOSPITAL_COMMUNITY): Payer: 59

## 2016-08-23 ENCOUNTER — Inpatient Hospital Stay (HOSPITAL_COMMUNITY)
Admission: AD | Admit: 2016-08-23 | Discharge: 2016-09-09 | DRG: 682 | Disposition: A | Discharge status: Home Health | Payer: 59 | Source: Other Acute Inpatient Hospital | Attending: Internal Medicine | Admitting: Internal Medicine

## 2016-08-23 ENCOUNTER — Encounter (HOSPITAL_COMMUNITY): Payer: Self-pay | Admitting: Internal Medicine

## 2016-08-23 DIAGNOSIS — R7401 Elevation of levels of liver transaminase levels: Secondary | ICD-10-CM

## 2016-08-23 DIAGNOSIS — E669 Obesity, unspecified: Secondary | ICD-10-CM | POA: Diagnosis present

## 2016-08-23 DIAGNOSIS — T424X2A Poisoning by benzodiazepines, intentional self-harm, initial encounter: Secondary | ICD-10-CM | POA: Diagnosis not present

## 2016-08-23 DIAGNOSIS — F319 Bipolar disorder, unspecified: Secondary | ICD-10-CM | POA: Diagnosis not present

## 2016-08-23 DIAGNOSIS — E785 Hyperlipidemia, unspecified: Secondary | ICD-10-CM | POA: Diagnosis not present

## 2016-08-23 DIAGNOSIS — Z79899 Other long term (current) drug therapy: Secondary | ICD-10-CM | POA: Diagnosis not present

## 2016-08-23 DIAGNOSIS — F1120 Opioid dependence, uncomplicated: Secondary | ICD-10-CM | POA: Diagnosis present

## 2016-08-23 DIAGNOSIS — R059 Cough, unspecified: Secondary | ICD-10-CM

## 2016-08-23 DIAGNOSIS — M797 Fibromyalgia: Secondary | ICD-10-CM | POA: Diagnosis present

## 2016-08-23 DIAGNOSIS — F259 Schizoaffective disorder, unspecified: Secondary | ICD-10-CM | POA: Diagnosis present

## 2016-08-23 DIAGNOSIS — K581 Irritable bowel syndrome with constipation: Secondary | ICD-10-CM | POA: Diagnosis present

## 2016-08-23 DIAGNOSIS — F1490 Cocaine use, unspecified, uncomplicated: Secondary | ICD-10-CM | POA: Diagnosis not present

## 2016-08-23 DIAGNOSIS — E039 Hypothyroidism, unspecified: Secondary | ICD-10-CM | POA: Diagnosis present

## 2016-08-23 DIAGNOSIS — R011 Cardiac murmur, unspecified: Secondary | ICD-10-CM | POA: Diagnosis present

## 2016-08-23 DIAGNOSIS — S36119A Unspecified injury of liver, initial encounter: Secondary | ICD-10-CM | POA: Diagnosis present

## 2016-08-23 DIAGNOSIS — I129 Hypertensive chronic kidney disease with stage 1 through stage 4 chronic kidney disease, or unspecified chronic kidney disease: Secondary | ICD-10-CM | POA: Diagnosis not present

## 2016-08-23 DIAGNOSIS — T40602A Poisoning by unspecified narcotics, intentional self-harm, initial encounter: Secondary | ICD-10-CM | POA: Diagnosis not present

## 2016-08-23 DIAGNOSIS — T50911A Poisoning by multiple unspecified drugs, medicaments and biological substances, accidental (unintentional), initial encounter: Secondary | ICD-10-CM

## 2016-08-23 DIAGNOSIS — Z9289 Personal history of other medical treatment: Secondary | ICD-10-CM

## 2016-08-23 DIAGNOSIS — J9600 Acute respiratory failure, unspecified whether with hypoxia or hypercapnia: Secondary | ICD-10-CM | POA: Diagnosis not present

## 2016-08-23 DIAGNOSIS — R000 Tachycardia, unspecified: Secondary | ICD-10-CM

## 2016-08-23 DIAGNOSIS — Z87891 Personal history of nicotine dependence: Secondary | ICD-10-CM | POA: Diagnosis not present

## 2016-08-23 DIAGNOSIS — R945 Abnormal results of liver function studies: Secondary | ICD-10-CM | POA: Diagnosis not present

## 2016-08-23 DIAGNOSIS — Y848 Other medical procedures as the cause of abnormal reaction of the patient, or of later complication, without mention of misadventure at the time of the procedure: Secondary | ICD-10-CM | POA: Diagnosis not present

## 2016-08-23 DIAGNOSIS — M549 Dorsalgia, unspecified: Secondary | ICD-10-CM | POA: Diagnosis present

## 2016-08-23 DIAGNOSIS — Z4659 Encounter for fitting and adjustment of other gastrointestinal appliance and device: Secondary | ICD-10-CM

## 2016-08-23 DIAGNOSIS — G8929 Other chronic pain: Secondary | ICD-10-CM | POA: Diagnosis present

## 2016-08-23 DIAGNOSIS — D72829 Elevated white blood cell count, unspecified: Secondary | ICD-10-CM | POA: Diagnosis present

## 2016-08-23 DIAGNOSIS — T50901A Poisoning by unspecified drugs, medicaments and biological substances, accidental (unintentional), initial encounter: Secondary | ICD-10-CM

## 2016-08-23 DIAGNOSIS — R5381 Other malaise: Secondary | ICD-10-CM | POA: Diagnosis not present

## 2016-08-23 DIAGNOSIS — J9601 Acute respiratory failure with hypoxia: Secondary | ICD-10-CM | POA: Diagnosis not present

## 2016-08-23 DIAGNOSIS — E876 Hypokalemia: Secondary | ICD-10-CM | POA: Diagnosis present

## 2016-08-23 DIAGNOSIS — J95851 Ventilator associated pneumonia: Secondary | ICD-10-CM | POA: Diagnosis not present

## 2016-08-23 DIAGNOSIS — J9690 Respiratory failure, unspecified, unspecified whether with hypoxia or hypercapnia: Secondary | ICD-10-CM | POA: Diagnosis not present

## 2016-08-23 DIAGNOSIS — D649 Anemia, unspecified: Secondary | ICD-10-CM | POA: Diagnosis present

## 2016-08-23 DIAGNOSIS — Z833 Family history of diabetes mellitus: Secondary | ICD-10-CM

## 2016-08-23 DIAGNOSIS — J9602 Acute respiratory failure with hypercapnia: Secondary | ICD-10-CM | POA: Diagnosis present

## 2016-08-23 DIAGNOSIS — K219 Gastro-esophageal reflux disease without esophagitis: Secondary | ICD-10-CM | POA: Diagnosis not present

## 2016-08-23 DIAGNOSIS — G9341 Metabolic encephalopathy: Secondary | ICD-10-CM | POA: Diagnosis not present

## 2016-08-23 DIAGNOSIS — E872 Acidosis: Secondary | ICD-10-CM | POA: Diagnosis present

## 2016-08-23 DIAGNOSIS — Z452 Encounter for adjustment and management of vascular access device: Secondary | ICD-10-CM

## 2016-08-23 DIAGNOSIS — Z8249 Family history of ischemic heart disease and other diseases of the circulatory system: Secondary | ICD-10-CM

## 2016-08-23 DIAGNOSIS — G894 Chronic pain syndrome: Secondary | ICD-10-CM | POA: Diagnosis present

## 2016-08-23 DIAGNOSIS — F1190 Opioid use, unspecified, uncomplicated: Secondary | ICD-10-CM | POA: Diagnosis not present

## 2016-08-23 DIAGNOSIS — Z01818 Encounter for other preprocedural examination: Secondary | ICD-10-CM

## 2016-08-23 DIAGNOSIS — N189 Chronic kidney disease, unspecified: Secondary | ICD-10-CM | POA: Diagnosis present

## 2016-08-23 DIAGNOSIS — M6282 Rhabdomyolysis: Secondary | ICD-10-CM

## 2016-08-23 DIAGNOSIS — Z87820 Personal history of traumatic brain injury: Secondary | ICD-10-CM

## 2016-08-23 DIAGNOSIS — R0602 Shortness of breath: Secondary | ICD-10-CM

## 2016-08-23 DIAGNOSIS — R739 Hyperglycemia, unspecified: Secondary | ICD-10-CM | POA: Diagnosis present

## 2016-08-23 DIAGNOSIS — Z6833 Body mass index (BMI) 33.0-33.9, adult: Secondary | ICD-10-CM

## 2016-08-23 DIAGNOSIS — T50901D Poisoning by unspecified drugs, medicaments and biological substances, accidental (unintentional), subsequent encounter: Secondary | ICD-10-CM | POA: Diagnosis not present

## 2016-08-23 DIAGNOSIS — Z885 Allergy status to narcotic agent status: Secondary | ICD-10-CM

## 2016-08-23 DIAGNOSIS — J9811 Atelectasis: Secondary | ICD-10-CM | POA: Diagnosis present

## 2016-08-23 DIAGNOSIS — Z915 Personal history of self-harm: Secondary | ICD-10-CM

## 2016-08-23 DIAGNOSIS — E874 Mixed disorder of acid-base balance: Secondary | ICD-10-CM | POA: Diagnosis present

## 2016-08-23 DIAGNOSIS — I10 Essential (primary) hypertension: Secondary | ICD-10-CM

## 2016-08-23 DIAGNOSIS — Z888 Allergy status to other drugs, medicaments and biological substances status: Secondary | ICD-10-CM

## 2016-08-23 DIAGNOSIS — N179 Acute kidney failure, unspecified: Secondary | ICD-10-CM

## 2016-08-23 DIAGNOSIS — Z7982 Long term (current) use of aspirin: Secondary | ICD-10-CM

## 2016-08-23 DIAGNOSIS — N170 Acute kidney failure with tubular necrosis: Principal | ICD-10-CM | POA: Diagnosis present

## 2016-08-23 DIAGNOSIS — Z841 Family history of disorders of kidney and ureter: Secondary | ICD-10-CM

## 2016-08-23 DIAGNOSIS — Z9689 Presence of other specified functional implants: Secondary | ICD-10-CM | POA: Diagnosis present

## 2016-08-23 DIAGNOSIS — R740 Nonspecific elevation of levels of transaminase and lactic acid dehydrogenase [LDH]: Secondary | ICD-10-CM | POA: Diagnosis not present

## 2016-08-23 DIAGNOSIS — Z4682 Encounter for fitting and adjustment of non-vascular catheter: Secondary | ICD-10-CM | POA: Diagnosis not present

## 2016-08-23 DIAGNOSIS — G9340 Encephalopathy, unspecified: Secondary | ICD-10-CM | POA: Diagnosis not present

## 2016-08-23 DIAGNOSIS — F419 Anxiety disorder, unspecified: Secondary | ICD-10-CM | POA: Diagnosis present

## 2016-08-23 DIAGNOSIS — M8580 Other specified disorders of bone density and structure, unspecified site: Secondary | ICD-10-CM | POA: Diagnosis present

## 2016-08-23 DIAGNOSIS — R05 Cough: Secondary | ICD-10-CM | POA: Diagnosis not present

## 2016-08-23 DIAGNOSIS — R1112 Projectile vomiting: Secondary | ICD-10-CM | POA: Diagnosis not present

## 2016-08-23 DIAGNOSIS — T829XXA Unspecified complication of cardiac and vascular prosthetic device, implant and graft, initial encounter: Secondary | ICD-10-CM

## 2016-08-23 DIAGNOSIS — R34 Anuria and oliguria: Secondary | ICD-10-CM | POA: Diagnosis present

## 2016-08-23 HISTORY — DX: Poisoning by multiple unspecified drugs, medicaments and biological substances, accidental (unintentional), initial encounter: T50.911A

## 2016-08-23 HISTORY — DX: Essential (primary) hypertension: I10

## 2016-08-23 HISTORY — DX: Tachycardia, unspecified: R00.0

## 2016-08-23 HISTORY — DX: Acute kidney failure, unspecified: N17.9

## 2016-08-23 HISTORY — DX: Elevation of levels of liver transaminase levels: R74.01

## 2016-08-23 HISTORY — DX: Rhabdomyolysis: M62.82

## 2016-08-23 LAB — COMPREHENSIVE METABOLIC PANEL
ALBUMIN: 2.7 g/dL — AB (ref 3.5–5.0)
ALT: 284 U/L — AB (ref 17–63)
AST: 1001 U/L — AB (ref 15–41)
Alkaline Phosphatase: 71 U/L (ref 38–126)
Anion gap: 18 — ABNORMAL HIGH (ref 5–15)
BUN: 106 mg/dL — AB (ref 6–20)
CHLORIDE: 105 mmol/L (ref 101–111)
CO2: 15 mmol/L — AB (ref 22–32)
Calcium: 6.7 mg/dL — ABNORMAL LOW (ref 8.9–10.3)
Creatinine, Ser: 10.01 mg/dL — ABNORMAL HIGH (ref 0.61–1.24)
GFR calc non Af Amer: 5 mL/min — ABNORMAL LOW (ref >60)
GFR, EST AFRICAN AMERICAN: 6 mL/min — AB (ref >60)
GLUCOSE: 117 mg/dL — AB (ref 65–99)
Potassium: 4.6 mmol/L (ref 3.5–5.1)
SODIUM: 138 mmol/L (ref 135–145)
Total Bilirubin: 0.9 mg/dL (ref 0.3–1.2)
Total Protein: 5.4 g/dL — ABNORMAL LOW (ref 6.5–8.1)

## 2016-08-23 LAB — CBC WITH DIFFERENTIAL/PLATELET
BASOS ABS: 0 10*3/uL (ref 0.0–0.1)
BASOS PCT: 0 %
EOS ABS: 0.1 10*3/uL (ref 0.0–0.7)
EOS PCT: 1 %
HCT: 32.6 % — ABNORMAL LOW (ref 39.0–52.0)
Hemoglobin: 11.2 g/dL — ABNORMAL LOW (ref 13.0–17.0)
Lymphocytes Relative: 8 %
Lymphs Abs: 0.7 10*3/uL (ref 0.7–4.0)
MCH: 28.8 pg (ref 26.0–34.0)
MCHC: 34.4 g/dL (ref 30.0–36.0)
MCV: 83.8 fL (ref 78.0–100.0)
Monocytes Absolute: 0.7 10*3/uL (ref 0.1–1.0)
Monocytes Relative: 8 %
NEUTROS PCT: 84 %
Neutro Abs: 7.8 10*3/uL — ABNORMAL HIGH (ref 1.7–7.7)
PLATELETS: 98 10*3/uL — AB (ref 150–400)
RBC: 3.89 MIL/uL — AB (ref 4.22–5.81)
RDW: 12.5 % (ref 11.5–15.5)
WBC: 9.3 10*3/uL (ref 4.0–10.5)

## 2016-08-23 LAB — SALICYLATE LEVEL

## 2016-08-23 LAB — ACETAMINOPHEN LEVEL: Acetaminophen (Tylenol), Serum: <10 ug/mL — ABNORMAL LOW (ref 10–30)

## 2016-08-23 LAB — CK

## 2016-08-23 LAB — MRSA PCR SCREENING: MRSA by PCR: NEGATIVE

## 2016-08-23 MED ORDER — LEVOTHYROXINE SODIUM 75 MCG PO TABS: 75.0000 ug | ORAL_TABLET | Freq: Every day | ORAL | Status: DC

## 2016-08-23 MED ORDER — ONDANSETRON HCL 4 MG PO TABS
4.0000 mg | ORAL_TABLET | Freq: Four times a day (QID) | ORAL | Status: DC | PRN
  Administered 2016-09-05 – 2016-09-08 (×4): 4 mg via ORAL
  Filled 2016-08-23 (×4): qty 1

## 2016-08-23 MED ORDER — PANTOPRAZOLE SODIUM 40 MG PO TBEC: 40.0000 mg | DELAYED_RELEASE_TABLET | Freq: Every day | ORAL | Status: DC

## 2016-08-23 MED ORDER — SODIUM CHLORIDE 0.9 % IV SOLN
2.0000 g | Freq: Once | INTRAVENOUS | Status: AC
  Administered 2016-08-23: 2 g via INTRAVENOUS
  Filled 2016-08-23: qty 20

## 2016-08-23 MED ORDER — PANTOPRAZOLE SODIUM 40 MG IV SOLR
40.0000 mg | INTRAVENOUS | Status: DC
  Administered 2016-08-23 – 2016-08-28 (×6): 40 mg via INTRAVENOUS
  Filled 2016-08-23 (×6): qty 40

## 2016-08-23 MED ORDER — LEVOTHYROXINE SODIUM 100 MCG IV SOLR
37.5000 ug | Freq: Every day | INTRAVENOUS | Status: DC
  Administered 2016-08-23 – 2016-08-28 (×6): 37.5 ug via INTRAVENOUS
  Filled 2016-08-23 (×6): qty 5

## 2016-08-23 MED ORDER — ORAL CARE MOUTH RINSE
15.0000 mL | Freq: Two times a day (BID) | OROMUCOSAL | Status: DC
  Administered 2016-08-23: 15 mL via OROMUCOSAL

## 2016-08-23 MED ORDER — LIDOCAINE HCL (PF) 1 % IJ SOLN
INTRAMUSCULAR | Status: AC
  Filled 2016-08-23: qty 5

## 2016-08-23 MED ORDER — SODIUM BICARBONATE 8.4 % IV SOLN
INTRAVENOUS | Status: DC
  Administered 2016-08-23 (×3): via INTRAVENOUS
  Filled 2016-08-23 (×7): qty 150

## 2016-08-23 MED ORDER — HYDROMORPHONE HCL 1 MG/ML IJ SOLN
0.5000 mg | INTRAMUSCULAR | Status: DC | PRN
Start: 2016-08-23 — End: 2016-08-29
  Administered 2016-08-27 – 2016-08-29 (×11): 0.5 mg via INTRAVENOUS
  Filled 2016-08-23 (×11): qty 1

## 2016-08-23 MED ORDER — LIDOCAINE HCL (PF) 1 % IJ SOLN
INTRAMUSCULAR | Status: AC
  Administered 2016-08-23: 10 mL
  Filled 2016-08-23: qty 10

## 2016-08-23 MED ORDER — SODIUM CHLORIDE 0.9 % IV SOLN
INTRAVENOUS | Status: DC
  Administered 2016-08-23: 150 mL via INTRAVENOUS
  Administered 2016-08-23: 08:00:00 via INTRAVENOUS

## 2016-08-23 MED ORDER — SODIUM CHLORIDE 0.9% FLUSH
3.0000 mL | Freq: Two times a day (BID) | INTRAVENOUS | Status: DC
  Administered 2016-08-23 – 2016-09-08 (×27): 3 mL via INTRAVENOUS

## 2016-08-23 MED ORDER — METOPROLOL TARTRATE 5 MG/5ML IV SOLN
2.5000 mg | Freq: Four times a day (QID) | INTRAVENOUS | Status: DC
  Administered 2016-08-23 – 2016-08-27 (×14): 2.5 mg via INTRAVENOUS
  Filled 2016-08-23 (×14): qty 5

## 2016-08-23 MED ORDER — METOPROLOL TARTRATE 12.5 MG HALF TABLET: 12.5000 mg | ORAL_TABLET | Freq: Two times a day (BID) | ORAL | Status: DC

## 2016-08-23 MED ORDER — ASPIRIN EC 81 MG PO TBEC
81.0000 mg | DELAYED_RELEASE_TABLET | Freq: Every day | ORAL | Status: DC
  Administered 2016-08-24 – 2016-09-09 (×13): 81 mg via ORAL
  Filled 2016-08-23 (×15): qty 1

## 2016-08-23 MED ORDER — ONDANSETRON HCL 4 MG/2ML IJ SOLN
4.0000 mg | Freq: Four times a day (QID) | INTRAMUSCULAR | Status: DC | PRN
  Administered 2016-08-27 – 2016-09-09 (×7): 4 mg via INTRAVENOUS
  Filled 2016-08-23 (×8): qty 2

## 2016-08-23 NOTE — Procedures (Signed)
Under sterile conditions and using US guidance, a 20-cm 3-lumen temporary HD Trialysis catheter was placed into the R femoral vein using the Seldinger technique w/o difficulty.  No complications. Ready to use.  Indication - AKI/ uremia/ AMS.   Kelly Splinter MD [employer] pgr [phone]   08/23/2016, 4:32 PM

## 2016-08-23 NOTE — Progress Notes (Signed)
Wife notified of plan of care and she is happy with it. Hal Neer RN BSN MSN. 08/23/2016 17.40

## 2016-08-23 NOTE — Consult Note (Signed)
Renal Service Consult Note [name]  [name] 08/23/2016 [name] Requesting Physician:  Dr Ree Kida  Reason for Consult:  Acute renal failure HPI: The patient is a 51 y.o. year-old with history of bipolar d/o, schizophrenia, chronic pain who presented to outside hospital yesterday after being found down at home by his family.  Taken to ED where labs showed high BUN/ Cr. Asked to see for renal failure.   Pt obtunded.  Unclear what medication he took at home if any.  History of rhabdo, AKI, bipolar d/o, suicide attempt (2011), syncope (2005) in the past.    Past Medical History  Past Medical History:  Diagnosis Date  . Accelerated hypertension 08/23/2016  . Anxiety   . Arthritis   . Bipolar affective disorder ([name])   . Chronic back pain   . Chronic kidney disease    AKF - due to Rhabdomyolysis  . DDD (degenerative disc disease)   . Depression   . DJD (degenerative joint disease)   . Elevated liver function tests   . Fibromyalgia   . GERD (gastroesophageal reflux disease)   . Head injury   . Head injury, closed, with concussion    x 3  . Hyperlipidemia   . Hypothyroidism   . IBS (irritable bowel syndrome)   . Inguinal hernia    left  . Insomnia   . Myofascial pain syndrome   . Osteopenia   . Shortness of breath dyspnea    with exertion  . Tibial plateau fracture, left    Past Surgical History  Past Surgical History:  Procedure Laterality Date  . CARDIAC CATHETERIZATION N/A 08/31/2015   Procedure: Left Heart Cath and Coronary Angiography;  Surgeon: Leonie Man, MD;  Location: [place] [facility];  Service: Cardiovascular;  Laterality: N/A;  . Carotid Dopplers Bilateral 02/20/2015   [facility]: Mild, less than 39% left and right internal carotid artery stenosis. No significant plaque burden  . CERVICAL DISC SURGERY     C5-7  . COLONOSCOPY    . COLONOSCOPY    . ESOPHAGOGASTRODUODENOSCOPY    . FASCIOTOMY Left 10/20/2013    Procedure: LEFT leg ANTERIOR COMPARTMENT FACSCIOTOMY;  Surgeon: Rozanna Box, MD;  Location: [place];  Service: Orthopedics;  Laterality: Left;  . INGUINAL HERNIA REPAIR Left   . Nuclear Stress Test  03/06/2015   [facility]: Normal EKG. Low normal EF (49%) normal regional wall motion. No evidence of ischemia or infarction.  . ORIF TIBIA PLATEAU Left 10/20/2013   Procedure: OPEN REDUCTION INTERNAL FIXATION (ORIF) LEFT TIBIAL PLATEAU;  Surgeon: Rozanna Box, MD;  Location: [place];  Service: Orthopedics;  Laterality: Left;  . SEPTOPLASTY    . SPINAL CORD STIMULATOR BATTERY EXCHANGE N/A 02/08/2016   Procedure: Lumbar spinal cord stimulator implantable pulse generator replacement;  Surgeon: Clydell Hakim, MD;  Location: [place] [facility];  Service: Neurosurgery;  Laterality: N/A;  . SPINAL CORD STIMULATOR IMPLANT    . TRANSTHORACIC ECHOCARDIOGRAM  02/20/2015   [facility]: Mild concentric LVH. EF 55-60%. Normal regional wall motion. Mild to moderate TR with no significant pulmonary hypertension.   Family History  Family History  Problem Relation Age of Onset  . Heart disease Mother   . Hypertension Mother   . Sudden death Mother     Presumably cardiac  . Hyperlipidemia Father   . Heart attack Father 17    At least 5 MIs. Had CABG.  . Heart failure Father   .  Diabetes Father   . Hypertension Father   . Kidney disease Father   . Diabetes Brother   . Lung cancer Maternal Grandmother   . Diabetes Paternal Grandmother   . Sudden death Paternal Grandmother     Unclear etiology  . Diabetes    . Kidney disease    . Kidney disease Brother   . Hypertension Brother   . Sudden death Brother     Thought to be related to heart disease  . Throat cancer Brother   . Colon cancer Neg Hx    Social History  reports that he has quit smoking. His smoking use included Cigarettes. He has a 35.00 pack-year smoking history. He has never used smokeless tobacco. He reports that he uses  drugs, including Oxycodone and Cocaine. He reports that he does not drink alcohol. Allergies  Allergies  Allergen Reactions  . Zolpidem Tartrate Other (See Comments)    Hallucinations and sleep walks  . Demerol [Meperidine] Itching and Rash   Home medications Prior to Admission medications   Medication Sig Start Date End Date Taking? Authorizing Provider  esomeprazole (NEXIUM) 40 MG capsule Take 40 mg by mouth daily at 12 noon.   Yes Historical Provider, MD  OLANZapine (ZYPREXA) 7.5 MG tablet Take 7.5 mg by mouth at bedtime.   Yes Historical Provider, MD  tamsulosin (FLOMAX) 0.4 MG CAPS capsule Take 0.4 mg by mouth daily. 06/13/16  Yes Historical Provider, MD  aspirin EC 81 MG tablet Take 81 mg by mouth daily.    Historical Provider, MD  busPIRone (BUSPAR) 7.5 MG tablet Take 7.5 mg by mouth 2 (two) times daily.    Historical Provider, MD  gabapentin (NEURONTIN) 300 MG capsule Take 300 mg by mouth 3 (three) times daily.    Historical Provider, MD  hydrOXYzine (ATARAX/VISTARIL) 25 MG tablet Take 25 mg by mouth 3 (three) times daily.    Historical Provider, MD  lamoTRIgine (LAMICTAL) 25 MG tablet Take 1 tablet (25 mg total) by mouth daily. Patient taking differently: Take 50 mg by mouth daily.  12/31/15   Kerrie Buffalo, NP  levothyroxine (SYNTHROID, LEVOTHROID) 75 MCG tablet Take 75 mcg by mouth daily before breakfast.    Historical Provider, MD  metoprolol tartrate (LOPRESSOR) 25 MG tablet Take 12.5 mg by mouth 2 (two) times daily.    Historical Provider, MD  Oxycodone HCl 10 MG TABS Every 4-6 hours PRN for post-surgical pain 02/08/16   Clydell Hakim, MD  tiZANidine (ZANAFLEX) 2 MG tablet Take 2 mg by mouth 2 (two) times daily.    Historical Provider, MD   Liver Function Tests  Recent Labs Lab 08/23/16 0301  AST 1,001*  ALT 284*  ALKPHOS 71  BILITOT 0.9  PROT 5.4*  ALBUMIN 2.7*   No results for input(s): LIPASE, AMYLASE in the last 168 hours. CBC  Recent Labs Lab 08/23/16 0301   WBC 9.3  NEUTROABS 7.8*  HGB 11.2*  HCT 32.6*  MCV 83.8  PLT 98*   Basic Metabolic Panel  Recent Labs Lab 08/23/16 0301  NA 138  K 4.6  CL 105  CO2 15*  GLUCOSE 117*  BUN 106*  CREATININE 10.01*  CALCIUM 6.7*   Iron/TIBC/Ferritin/ %Sat No results found for: IRON, TIBC, FERRITIN, IRONPCTSAT  Vitals:   08/23/16 0500 08/23/16 0810 08/23/16 0951 08/23/16 1138  BP: (!) 161/81 (!) 162/78  (!) 164/79  Pulse: (!) 112 (!) 113  (!) 106  Resp: (!) 9 11  12   Temp:   97.5  F (36.4 C) 97.2 F (36.2 C)  TempSrc:  Oral Axillary Axillary  SpO2: 96% 98%  95%  Weight:      Height:       Exam Gen obtunded, not responding, breathing normally No rash, cyanosis or gangrene Sclera anicteric, throat clear No jvd or bruits Chest clear bilat RRR no MRG Abd soft ntnd no mass or ascites +bs GU normal male w foley in place, dark urine small amounts MS no joint effusions or deformity Ext no LE edema / no wounds or ulcers Neuro as above    Assessment: 1. Acute kidney injury - dark brown urine w severe rhabdo, suspect ATN. Oliguric, has had 5-6L IVF, not making urine.  Obtunded w/ sig azotemia. Will plan for HD in the next 12-24 hours.   2. Rhabdomyolysis, acute 3. Bipolar d/o 4. Schizophrenia 5. Hx suicide attempt in the past 6. Chronic pain 7. Volume - looks euvolemic, BP's normal to high    Plan - as above  Kelly Splinter MD [employer] pager [phone]   08/23/2016, 3:29 PM

## 2016-08-23 NOTE — Progress Notes (Signed)
Scanned bladder and there was only 52 in it. Irrigated foley to see if it was blocked but it was not. MD aware of both. Hal Neer RN BSN MSN 08/23/2016 @13 .[phone]

## 2016-08-23 NOTE — Progress Notes (Signed)
PROGRESS NOTE    [name]  KXF:[id number] DOB: 1965-12-01 DOA: 08/23/2016 PCP: Isaias Cowman, PA-C   No chief complaint on file.   Brief Narrative:  HPI on 08/23/2016 by Dr. Lily Kocher [name] is a 51 y.o. gentleman with a history of Bipolar disorder, schizophrenia, chronic pain from his neck and back, and hypothyroidism who has a history of unintentional drug overdose and two prior episodes of rhadomyolysis causing acute kidney injury.  The patient presented to the outside hospital yesterday after being found down at home by his wife with altered mental status and generalized weakness.  No nausea or vomiting noted.  No bowel or bladder incontinence noted.  No convulsions noted.  He was taken to the ED at [facility] for emergent evaluation.  The patient has a spine stimulator for management of his chronic pain.  His wife also reports recent changes to his pain medication (increase in dose), and she suspects that increased use of the pain medication precipitated confusion, which contributed to him mismanaging his other medications.  However, she reports refills of his medications approximately one week ago and his Zyprexa and vistaril bottles are now empty, and his gabapentin and buspar bottles are "half full".  The patient's wife does not think that he was trying to hurt himself. Assessment & Plan   Patient admitted earlier today. See H&P for details.   Acute kidney injury with severe rhabdomyolysis and oliguria -Likely secondary to overdose -Creatinine 10 -Continue to monitor intake/output- patient has had poor output, Bladder scan ordered -Nephrology consulted and appreciated -Continue IVF and monitor BMP -Continue to monitor CK levels -Abdominal US: No acute process within the abdomen, no hydronephrosis  Transaminitis -likely related to acute ingestion -[place] as above -Will continue to monitor  Polysubstance overdose -Will need psych consult eventually- currently  patient is not very talkative or interactive -No apparent suicidal ideation.  History of Bipolar disorder, schizophrenia, anxiety/depression -All neuropsych meds on hold for now -Not entirely sure of what patient used or how much  Tachycardia -likely compensatory  -Continue metoprolol - changed to IV form  Hypothyroidism -continue synthroid - changed to IV form  GERD -Continue PPI   DVT Prophylaxis  SCDs  Code Status: Full  Family Communication: None at bedside  Disposition Plan: Admitted.  Consultants Nephrology  Procedures  Abdominal US  Antibiotics   Anti-infectives    None      Subjective:   [name] seen and examined today.  Not verbal or interactive, mumbles.   Objective:   Vitals:   08/23/16 0500 08/23/16 0810 08/23/16 0951 08/23/16 1138  BP: (!) 161/81 (!) 162/78  (!) 164/79  Pulse: (!) 112 (!) 113  (!) 106  Resp: (!) 9 11  12   Temp:   97.5 F (36.4 C) 97.2 F (36.2 C)  TempSrc:  Oral Axillary Axillary  SpO2: 96% 98%  95%  Weight:      Height:        Intake/Output Summary (Last 24 hours) at 08/23/16 1353 Last data filed at 08/23/16 1300  Gross per 24 hour  Intake          1713.33 ml  Output                0 ml  Net          1713.33 ml   Filed Weights   08/23/16 0134  Weight: 112.8 kg (248 lb 11.2 oz)    Exam  General: Well developed, well nourished,  NAD, appears stated age  23: NCAT,  mucous membranes moist.   Cardiovascular: S1 S2 auscultated, no rubs, murmurs or gallops. Regular rate and rhythm.  Respiratory: Clear to auscultation bilaterally with equal chest rise  Abdomen: Soft, nontender, nondistended, + bowel sounds  Extremities: warm dry without cyanosis clubbing or edema  Neuro: Cannot fully assess. Currently sleeping, and not interactive. Arousable, however falls asleep.  Skin: Without rashes exudates or nodules   Data Reviewed: I have personally reviewed following labs and imaging  studies  CBC:  Recent Labs Lab 08/23/16 0301  WBC 9.3  NEUTROABS 7.8*  HGB 11.2*  HCT 32.6*  MCV 83.8  PLT 98*   Basic Metabolic Panel:  Recent Labs Lab 08/23/16 0301  NA 138  K 4.6  CL 105  CO2 15*  GLUCOSE 117*  BUN 106*  CREATININE 10.01*  CALCIUM 6.7*   GFR: Estimated Creatinine Clearance: 11.6 mL/min (A) (by C-G formula based on SCr of 10.01 mg/dL (H)). Liver Function Tests:  Recent Labs Lab 08/23/16 0301  AST 1,001*  ALT 284*  ALKPHOS 71  BILITOT 0.9  PROT 5.4*  ALBUMIN 2.7*   No results for input(s): LIPASE, AMYLASE in the last 168 hours. No results for input(s): AMMONIA in the last 168 hours. Coagulation Profile: No results for input(s): INR, PROTIME in the last 168 hours. Cardiac Enzymes:  Recent Labs Lab 08/23/16 0301  CKTOTAL >50,000*   BNP (last 3 results) No results for input(s): PROBNP in the last 8760 hours. HbA1C: No results for input(s): HGBA1C in the last 72 hours. CBG: No results for input(s): GLUCAP in the last 168 hours. Lipid Profile: No results for input(s): CHOL, HDL, LDLCALC, TRIG, CHOLHDL, LDLDIRECT in the last 72 hours. Thyroid Function Tests: No results for input(s): TSH, T4TOTAL, FREET4, T3FREE, THYROIDAB in the last 72 hours. Anemia Panel: No results for input(s): VITAMINB12, FOLATE, FERRITIN, TIBC, IRON, RETICCTPCT in the last 72 hours. Urine analysis:    Component Value Date/Time   COLORURINE YELLOW 10/20/2013 [place] 10/20/2013 1247   LABSPEC 1.020 10/20/2013 1247   PHURINE 6.0 10/20/2013 1247   GLUCOSEU NEGATIVE 10/20/2013 1247   HGBUR NEGATIVE 10/20/2013 1247   BILIRUBINUR NEGATIVE 10/20/2013 1247   KETONESUR NEGATIVE 10/20/2013 1247   PROTEINUR NEGATIVE 10/20/2013 1247   UROBILINOGEN 1.0 10/20/2013 1247   NITRITE NEGATIVE 10/20/2013 1247   LEUKOCYTESUR NEGATIVE 10/20/2013 1247   Sepsis Labs: @LABRCNTIP (procalcitonin:4,lacticidven:4)  ) Recent Results (from the past 240 hour(s))   MRSA PCR Screening     Status: None   Collection Time: 08/23/16  1:33 AM  Result Value Ref Range Status   MRSA by PCR NEGATIVE NEGATIVE Final    Comment:        The GeneXpert MRSA Assay (FDA approved for NASAL specimens only), is one component of a comprehensive MRSA colonization surveillance program. It is not intended to diagnose MRSA infection nor to guide or monitor treatment for MRSA infections.       Radiology Studies: US Abdomen Complete  Result Date: 08/23/2016 CLINICAL DATA:  Patient with abnormal LFTs. EXAM: ABDOMEN ULTRASOUND COMPLETE COMPARISON:  None. FINDINGS: Limited exam secondary to bowel gas and patient's inability to follow commands. Gallbladder: No gallstones or wall thickening visualized. No sonographic Murphy sign noted by sonographer. Common bile duct: Diameter: 5 mm Liver: No focal lesion identified. Within normal limits in parenchymal echogenicity. IVC: No abnormality visualized. Pancreas: Visualized portion unremarkable. Spleen: Size and appearance within normal limits. Right Kidney: Length: 11.2 cm. Echogenicity  within normal limits. No mass or hydronephrosis visualized. Small amount of perinephric fluid. Left Kidney: Length: 12.7 cm. Echogenicity within normal limits. No mass or hydronephrosis visualized. Small amount of perinephric fluid. Abdominal aorta: No aneurysm visualized. Other findings: None. IMPRESSION: Markedly limited exam secondary to bowel gas and patient's inability to cooperate. No acute process within the abdomen. No hydronephrosis. Electronically Signed   By: Lovey Newcomer M.D.   On: 08/23/2016 11:03   Dg Chest Port 1 View  Result Date: 08/23/2016 CLINICAL DATA:  Acute onset of shortness of breath. Initial encounter. EXAM: PORTABLE CHEST 1 VIEW COMPARISON:  Chest radiograph performed 08/22/2016 FINDINGS: The lungs are hypoexpanded. Vascular crowding and vascular congestion is seen. Increased interstitial markings raise concern for mild  interstitial edema. No pleural effusion or pneumothorax is seen. The cardiomediastinal silhouette is normal in size. A right IJ line is noted ending about the mid SVC. Thoracic spinal stimulation leads are partially imaged. Cervical spinal fusion hardware is noted. No acute osseous abnormalities are identified. IMPRESSION: Lungs hypoexpanded. Vascular congestion. Increased interstitial markings raise concern for mild interstitial edema. Electronically Signed   By: Garald Balding M.D.   On: 08/23/2016 02:14     Scheduled Meds: . aspirin EC  81 mg Oral Daily  . levothyroxine  75 mcg Oral QAC breakfast  . mouth rinse  15 mL Mouth Rinse BID  . metoprolol tartrate  12.5 mg Oral BID  . pantoprazole  40 mg Oral Daily  . sodium chloride flush  3 mL Intravenous Q12H   Continuous Infusions: . sodium chloride 150 mL/hr at 08/23/16 0732  .  sodium bicarbonate  infusion 1000 mL 100 mL/hr at 08/23/16 0516     LOS: 0 days   Time Spent in minutes   30 minutes  [name] D.O. on 08/23/2016 at 1:53 PM  Between 7am to 7pm - Pager - [phone]  After 7pm go to [email or web address] - password [email or web address]  And look for the night coverage person covering for me after hours  [facility] Office  [phone]

## 2016-08-23 NOTE — H&P (Signed)
History and Physical    [name]:[id number] DOB: Oct 14, 1965 DOA: 08/23/2016  PCP: Isaias Cowman, PA-C   Patient coming from: [facility] hospital  Chief Complaint: Transfer for AKI and acute liver injury in the setting of acute rhabdomyolysis and polysubstance overdose (reportedly unintentional)  HPI: [name] is a 51 y.o. gentleman with a history of Bipolar disorder, schizophrenia, chronic pain from his neck and back, and hypothyroidism who has a history of unintentional drug overdose and two prior episodes of rhadomyolysis causing acute kidney injury.  The patient presented to the outside hospital yesterday after being found down at home by his wife with altered mental status and generalized weakness.  No nausea or vomiting noted.  No bowel or bladder incontinence noted.  No convulsions noted.  He was taken to the ED at [facility] for emergent evaluation.  The patient has a spine stimulator for management of his chronic pain.  His wife also reports recent changes to his pain medication (increase in dose), and she suspects that increased use of the pain medication precipitated confusion, which contributed to him mismanaging his other medications.  However, she reports refills of his medications approximately one week ago and his Zyprexa and vistaril bottles are now empty, and his gabapentin and buspar bottles are "half full".  The patient's wife does not think that he was trying to hurt himself.  ED Course (At [facility]): EKG showed a sinus tachycardia.  Chest xray negative for acute process.  CPK level is 119,000.  BUN 108, Creatinine 10.4.  Bicarb level 16.  Calcium level 7.3.  WBC count 13.  Hgb 13.  Platelet 222.  AST 1451, ALT 407.  Normal bilirubin and alk phos.  Negative troponin.  Normal coags (INR is 1).  Normal BNP.  Normal lactic acid level.  UDS positive for benzos and oxydocodone.  Foley catheter placed; Right IJ Central venous catheter placed.  Patient S/P 4L of NS by the  time he arrived to [facility] and was complaining of shortness of breath upon arrival.  IV fluids have been suspended for now.  STAT labs have been ordered.  Review of Systems: Unable to obtain due to mental status changes.   Past Medical History:  Diagnosis Date  . Accelerated hypertension 08/23/2016  . Anxiety   . Arthritis   . Bipolar affective disorder ([place])   . Chronic back pain   . Chronic kidney disease    AKF - due to Rhabdomyolysis  . DDD (degenerative disc disease)   . Depression   . DJD (degenerative joint disease)   . Elevated liver function tests   . Fibromyalgia   . GERD (gastroesophageal reflux disease)   . Head injury   . Head injury, closed, with concussion    x 3  . Hyperlipidemia   . Hypothyroidism   . IBS (irritable bowel syndrome)   . Inguinal hernia    left  . Insomnia   . Myofascial pain syndrome   . Osteopenia   . Shortness of breath dyspnea    with exertion  . Tibial plateau fracture, left     Past Surgical History:  Procedure Laterality Date  . CARDIAC CATHETERIZATION N/A 08/31/2015   Procedure: Left Heart Cath and Coronary Angiography;  Surgeon: Leonie Man, MD;  Location: [facility];  Service: Cardiovascular;  Laterality: N/A;  . Carotid Dopplers Bilateral 02/20/2015   [facility]: Mild, less than 39% left and right internal carotid artery stenosis. No significant plaque burden  .  CERVICAL DISC SURGERY     C5-7  . COLONOSCOPY    . COLONOSCOPY    . ESOPHAGOGASTRODUODENOSCOPY    . FASCIOTOMY Left 10/20/2013   Procedure: LEFT leg ANTERIOR COMPARTMENT FACSCIOTOMY;  Surgeon: Rozanna Box, MD;  Location: [place];  Service: Orthopedics;  Laterality: Left;  . INGUINAL HERNIA REPAIR Left   . Nuclear Stress Test  03/06/2015   [facility]: Normal EKG. Low normal EF (49%) normal regional wall motion. No evidence of ischemia or infarction.  . ORIF TIBIA PLATEAU Left 10/20/2013   Procedure: OPEN REDUCTION INTERNAL  FIXATION (ORIF) LEFT TIBIAL PLATEAU;  Surgeon: Rozanna Box, MD;  Location: [place];  Service: Orthopedics;  Laterality: Left;  . SEPTOPLASTY    . SPINAL CORD STIMULATOR BATTERY EXCHANGE N/A 02/08/2016   Procedure: Lumbar spinal cord stimulator implantable pulse generator replacement;  Surgeon: Clydell Hakim, MD;  Location: [address] [facility];  Service: Neurosurgery;  Laterality: N/A;  . SPINAL CORD STIMULATOR IMPLANT    . TRANSTHORACIC ECHOCARDIOGRAM  02/20/2015   [facility]: Mild concentric LVH. EF 55-60%. Normal regional wall motion. Mild to moderate TR with no significant pulmonary hypertension.    SOCIAL HISTORY: Wife reports active tobacco use but denies EtOH or illicit drug use.  Allergies  Allergen Reactions  . Zolpidem Tartrate Other (See Comments)    Hallucinations and sleep walks  . Demerol [Meperidine] Itching and Rash    Family History  Problem Relation Age of Onset  . Heart disease Mother   . Hypertension Mother   . Sudden death Mother     Presumably cardiac  . Hyperlipidemia Father   . Heart attack Father 36    At least 5 MIs. Had CABG.  . Heart failure Father   . Diabetes Father   . Hypertension Father   . Kidney disease Father   . Diabetes Brother   . Lung cancer Maternal Grandmother   . Diabetes Paternal Grandmother   . Sudden death Paternal Grandmother     Unclear etiology  . Diabetes    . Kidney disease    . Kidney disease Brother   . Hypertension Brother   . Sudden death Brother     Thought to be related to heart disease  . Throat cancer Brother   . Colon cancer Neg Hx      Prior to Admission medications   Medication Sig Start Date End Date Taking? Authorizing Provider  aspirin EC 81 MG tablet Take 81 mg by mouth daily.    Historical Provider, MD  busPIRone (BUSPAR) 7.5 MG tablet Take 7.5 mg by mouth 2 (two) times daily.    Historical Provider, MD  esomeprazole (NEXIUM) 40 MG capsule Take 40 mg by mouth daily at 12 noon.    Historical  Provider, MD  gabapentin (NEURONTIN) 300 MG capsule Take 300 mg by mouth 3 (three) times daily.    Historical Provider, MD  hydrOXYzine (ATARAX/VISTARIL) 25 MG tablet Take 25 mg by mouth 3 (three) times daily.    Historical Provider, MD  lamoTRIgine (LAMICTAL) 25 MG tablet Take 1 tablet (25 mg total) by mouth daily. Patient taking differently: Take 50 mg by mouth daily.  12/31/15   Kerrie Buffalo, NP  levothyroxine (SYNTHROID, LEVOTHROID) 75 MCG tablet Take 75 mcg by mouth daily before breakfast.    Historical Provider, MD  metoprolol tartrate (LOPRESSOR) 25 MG tablet Take 12.5 mg by mouth 2 (two) times daily.    Historical Provider, MD  OLANZapine (ZYPREXA) 7.5 MG  tablet Take 7.5 mg by mouth at bedtime.    Historical Provider, MD  Oxycodone HCl 10 MG TABS Every 4-6 hours PRN for post-surgical pain 02/08/16   Clydell Hakim, MD  tiZANidine (ZANAFLEX) 2 MG tablet Take 2 mg by mouth 2 (two) times daily.    Historical Provider, MD    Physical Exam: Vitals:   08/23/16 0126 08/23/16 0134  BP: (!) 173/94   Pulse: (!) 113 (!) 115  Resp: (!) 9 17  SpO2: 95% 100%  Weight:  112.8 kg (248 lb 11.2 oz)  Height:  6' (1.829 m)      Constitutional: NAD, calm, disoriented, acutely ill appearing but he is not decompensating. Vitals:   08/23/16 0126 08/23/16 0134  BP: (!) 173/94   Pulse: (!) 113 (!) 115  Resp: (!) 9 17  SpO2: 95% 100%  Weight:  112.8 kg (248 lb 11.2 oz)  Height:  6' (1.829 m)   Eyes: PERRL, lids and conjunctivae normal ENMT: Mucous membranes are moist. Posterior pharynx not completely visualized. Normal dentition.  Neck: normal appearance, supple, no masses Respiratory: Bilateral crackles.  No wheezing.  No ronchi.  Normal respiratory effort. No accessory muscle use.  Cardiovascular: Tachycardic but regular.  No murmurs / rubs / gallops. No extremity edema. 2+ pedal pulses.  GI: abdomen is soft and compressible.  No distention.  No tenderness.  Bowel sounds are  present. Musculoskeletal:  No joint deformity in upper and lower extremities. Good ROM, no contractures. Normal muscle tone.  Skin: no rashes, warm and dry Neurologic: CN 2-12 grossly intact. Sensation intact, Strength symmetric bilaterally; generalized weakness.  Resting tremor noted.  Psychiatric: Only oriented to person.  Flat affect.  Judgment impaired.    Labs on Admission:  Notable labs from the OSH are listed above.  STAT CBC with diff, CMP, CK, acetaminophen level, salicyliate level, acute hepatitis panel, HIV pending here.  Radiological Exams on Admission: Dg Chest Port 1 View  Result Date: 08/23/2016 CLINICAL DATA:  Acute onset of shortness of breath. Initial encounter. EXAM: PORTABLE CHEST 1 VIEW COMPARISON:  Chest radiograph performed 08/22/2016 FINDINGS: The lungs are hypoexpanded. Vascular crowding and vascular congestion is seen. Increased interstitial markings raise concern for mild interstitial edema. No pleural effusion or pneumothorax is seen. The cardiomediastinal silhouette is normal in size. A right IJ line is noted ending about the mid SVC. Thoracic spinal stimulation leads are partially imaged. Cervical spinal fusion hardware is noted. No acute osseous abnormalities are identified. IMPRESSION: Lungs hypoexpanded. Vascular congestion. Increased interstitial markings raise concern for mild interstitial edema. Electronically Signed   By: Garald Balding M.D.   On: 08/23/2016 02:14    Assessment/Plan Principal Problem:   AKI (acute kidney injury) (Early) Active Problems:   PAIN, CHRONIC NEC   GERD   Rhabdomyolysis   Transaminitis   Drug overdose, multiple drugs   Tachycardia   Accelerated hypertension      AKI secondary to severe rhabdomyolysis with oliguria (650cc of urine documented at [place]; output declining) --Repeat CMP pending --Agree with foley catheter for now for strict I/O --Abdominal ultrasound (to look at kidneys and liver) --Expect he will need  HD; will call nephrology --S/P 4L NS; fluid on hold for now due to shortness of breath and increased pulmonary edema on chest xray.  Depending on labs, may need bicarb drip between now and 7AM  Transaminitis, likely related to acute ingestion --Follow trend --Abdominal ultrasound --Ancillary labs pending as noted above  Polysubstance overdose --Will need psych  consult eventually.  No apparent suicidal ideation.  History of Bipolar disorder, schizophrenia, anxiety/depression --All neuropsych meds on hold for now  GERD --PPI  Tachycardia, likely compensatory  --Continue home dose of metoprolol  Hypothyroidism --Continue home dose of levothyroxine  GERD --PPI   DVT prophylaxis: SCDs Code Status: FULL Family Communication: Wife present at bedside at time of admission. Disposition Plan: To be determined. Consults called: Nephrology; he will eventually need psych consult as well Admission status: Inpatient, stepdown unit   TIME SPENT:  70 minutes   Eber Jones MD [facility] Pager [phone]  If 7PM-7AM, please contact night-coverage [email or web address] Password [facility]  08/23/2016, 2:41 AM

## 2016-08-23 NOTE — Progress Notes (Signed)
Safe set, set up. RN aware

## 2016-08-24 ENCOUNTER — Inpatient Hospital Stay (HOSPITAL_COMMUNITY): Payer: 59

## 2016-08-24 DIAGNOSIS — J9600 Acute respiratory failure, unspecified whether with hypoxia or hypercapnia: Secondary | ICD-10-CM

## 2016-08-24 DIAGNOSIS — N179 Acute kidney failure, unspecified: Secondary | ICD-10-CM

## 2016-08-24 LAB — CBC
HCT: 33.2 % — ABNORMAL LOW (ref 39.0–52.0)
Hemoglobin: 11.2 g/dL — ABNORMAL LOW (ref 13.0–17.0)
MCH: 28.3 pg (ref 26.0–34.0)
MCHC: 33.7 g/dL (ref 30.0–36.0)
MCV: 83.8 fL (ref 78.0–100.0)
PLATELETS: 182 10*3/uL (ref 150–400)
RBC: 3.96 MIL/uL — ABNORMAL LOW (ref 4.22–5.81)
RDW: 12.8 % (ref 11.5–15.5)
WBC: 12.1 10*3/uL — ABNORMAL HIGH (ref 4.0–10.5)

## 2016-08-24 LAB — RENAL FUNCTION PANEL
ALBUMIN: 2.6 g/dL — AB (ref 3.5–5.0)
ANION GAP: 15 (ref 5–15)
BUN: 57 mg/dL — ABNORMAL HIGH (ref 6–20)
CO2: 23 mmol/L (ref 22–32)
Calcium: 7.4 mg/dL — ABNORMAL LOW (ref 8.9–10.3)
Chloride: 99 mmol/L — ABNORMAL LOW (ref 101–111)
Creatinine, Ser: 7.64 mg/dL — ABNORMAL HIGH (ref 0.61–1.24)
GFR calc Af Amer: 9 mL/min — ABNORMAL LOW (ref >60)
GFR calc non Af Amer: 7 mL/min — ABNORMAL LOW (ref >60)
GLUCOSE: 104 mg/dL — AB (ref 65–99)
PHOSPHORUS: 4.4 mg/dL (ref 2.5–4.6)
Potassium: 3.3 mmol/L — ABNORMAL LOW (ref 3.5–5.1)
Sodium: 137 mmol/L (ref 135–145)

## 2016-08-24 LAB — RAPID URINE DRUG SCREEN, HOSP PERFORMED
AMPHETAMINES: NOT DETECTED
Barbiturates: NOT DETECTED
Benzodiazepines: POSITIVE — AB
COCAINE: NOT DETECTED
OPIATES: POSITIVE — AB
Tetrahydrocannabinol: NOT DETECTED

## 2016-08-24 LAB — COMPREHENSIVE METABOLIC PANEL
ALBUMIN: 2.5 g/dL — AB (ref 3.5–5.0)
ALT: 221 U/L — ABNORMAL HIGH (ref 17–63)
ANION GAP: 16 — AB (ref 5–15)
AST: 494 U/L — ABNORMAL HIGH (ref 15–41)
Alkaline Phosphatase: 72 U/L (ref 38–126)
BUN: 108 mg/dL — ABNORMAL HIGH (ref 6–20)
CALCIUM: 6.5 mg/dL — AB (ref 8.9–10.3)
CHLORIDE: 101 mmol/L (ref 101–111)
CO2: 22 mmol/L (ref 22–32)
Creatinine, Ser: 11.32 mg/dL — ABNORMAL HIGH (ref 0.61–1.24)
GFR calc non Af Amer: 5 mL/min — ABNORMAL LOW (ref >60)
GFR, EST AFRICAN AMERICAN: 5 mL/min — AB (ref >60)
Glucose, Bld: 168 mg/dL — ABNORMAL HIGH (ref 65–99)
POTASSIUM: 4.8 mmol/L (ref 3.5–5.1)
SODIUM: 139 mmol/L (ref 135–145)
Total Bilirubin: 0.6 mg/dL (ref 0.3–1.2)
Total Protein: 5.2 g/dL — ABNORMAL LOW (ref 6.5–8.1)

## 2016-08-24 LAB — POCT I-STAT 3, ART BLOOD GAS (G3+)
Acid-base deficit: 7 mmol/L — ABNORMAL HIGH (ref 0.0–2.0)
Acid-base deficit: 4 mmol/L — ABNORMAL HIGH (ref 0.0–2.0)
Bicarbonate: 21.8 mmol/L (ref 20.0–28.0)
Bicarbonate: 22.4 mmol/L (ref 20.0–28.0)
O2 Saturation: 93 %
O2 Saturation: 100 %
PCO2 ART: 61.6 mmHg — AB (ref 32.0–48.0)
PCO2 ART: 43.7 mmHg (ref 32.0–48.0)
PH ART: 7.318 — AB (ref 7.350–7.450)
PO2 ART: 87 mmHg (ref 83.0–108.0)
TCO2: 24 mmol/L (ref 0–100)
TCO2: 24 mmol/L (ref 0–100)
pH, Arterial: 7.157 — CL (ref 7.350–7.450)
pO2, Arterial: 335 mmHg — ABNORMAL HIGH (ref 83.0–108.0)

## 2016-08-24 LAB — TRIGLYCERIDES: Triglycerides: 156 mg/dL — ABNORMAL HIGH (ref <150)

## 2016-08-24 LAB — CK: Total CK: 26910 U/L — ABNORMAL HIGH (ref 49–397)

## 2016-08-24 LAB — HEPATITIS PANEL, ACUTE
HCV Ab: <0.1 s/co ratio (ref 0.0–0.9)
HEP A IGM: NEGATIVE
HEP B C IGM: NEGATIVE
Hepatitis B Surface Ag: NEGATIVE

## 2016-08-24 LAB — HIV ANTIBODY (ROUTINE TESTING W REFLEX): HIV SCREEN 4TH GENERATION: NONREACTIVE

## 2016-08-24 MED ORDER — SUCCINYLCHOLINE CHLORIDE 20 MG/ML IJ SOLN
100.0000 mg | Freq: Once | INTRAMUSCULAR | Status: AC
  Administered 2016-08-24: 100 mg via INTRAVENOUS

## 2016-08-24 MED ORDER — PROPOFOL 1000 MG/100ML IV EMUL
0.0000 ug/kg/min | INTRAVENOUS | Status: DC
  Administered 2016-08-24: 5 ug/kg/min via INTRAVENOUS
  Administered 2016-08-24: 15 ug/kg/min via INTRAVENOUS
  Administered 2016-08-25: 20 ug/kg/min via INTRAVENOUS
  Administered 2016-08-26: 15 ug/kg/min via INTRAVENOUS
  Administered 2016-08-26: 25 ug/kg/min via INTRAVENOUS
  Administered 2016-08-26: 15 ug/kg/min via INTRAVENOUS
  Administered 2016-08-27 (×2): 25 ug/kg/min via INTRAVENOUS
  Filled 2016-08-24 (×6): qty 100
  Filled 2016-08-24: qty 200

## 2016-08-24 MED ORDER — HEPARIN SODIUM (PORCINE) 1000 UNIT/ML DIALYSIS
3000.0000 [IU] | INTRAMUSCULAR | Status: DC | PRN
  Administered 2016-08-24: 3000 [IU] via INTRAVENOUS_CENTRAL
  Filled 2016-08-24: qty 3

## 2016-08-24 MED ORDER — SODIUM CHLORIDE 0.9% FLUSH
10.0000 mL | Freq: Two times a day (BID) | INTRAVENOUS | Status: DC
  Administered 2016-08-24 – 2016-09-06 (×9): 10 mL

## 2016-08-24 MED ORDER — CHLORHEXIDINE GLUCONATE 0.12% ORAL RINSE (MEDLINE KIT)
15.0000 mL | Freq: Two times a day (BID) | OROMUCOSAL | Status: DC
  Administered 2016-08-24 – 2016-08-27 (×8): 15 mL via OROMUCOSAL

## 2016-08-24 MED ORDER — SODIUM CHLORIDE 0.9 % IV SOLN: 100.0000 mL | INTRAVENOUS | Status: DC | PRN

## 2016-08-24 MED ORDER — HEPARIN SODIUM (PORCINE) 1000 UNIT/ML DIALYSIS
1000.0000 [IU] | INTRAMUSCULAR | Status: DC | PRN
  Administered 2016-08-28: 1000 [IU] via INTRAVENOUS_CENTRAL
  Filled 2016-08-24 (×3): qty 1

## 2016-08-24 MED ORDER — FENTANYL BOLUS VIA INFUSION
50.0000 ug | INTRAVENOUS | Status: DC | PRN
  Administered 2016-08-25 – 2016-08-26 (×3): 50 ug via INTRAVENOUS
  Filled 2016-08-24: qty 50

## 2016-08-24 MED ORDER — PENTAFLUOROPROP-TETRAFLUOROETH EX AERO: 1.0000 "application " | INHALATION_SPRAY | CUTANEOUS | Status: DC | PRN

## 2016-08-24 MED ORDER — LIDOCAINE-PRILOCAINE 2.5-2.5 % EX CREA: 1.0000 "application " | TOPICAL_CREAM | CUTANEOUS | Status: DC | PRN

## 2016-08-24 MED ORDER — FENTANYL 2500MCG IN NS 250ML (10MCG/ML) PREMIX INFUSION
25.0000 ug/h | INTRAVENOUS | Status: DC
  Administered 2016-08-25: 50 ug/h via INTRAVENOUS
  Administered 2016-08-26 (×2): 150 ug/h via INTRAVENOUS
  Administered 2016-08-27: 200 ug/h via INTRAVENOUS
  Filled 2016-08-24 (×3): qty 250

## 2016-08-24 MED ORDER — ORAL CARE MOUTH RINSE
15.0000 mL | OROMUCOSAL | Status: DC
  Administered 2016-08-24 – 2016-08-27 (×34): 15 mL via OROMUCOSAL

## 2016-08-24 MED ORDER — HEPARIN SODIUM (PORCINE) 1000 UNIT/ML DIALYSIS
3500.0000 [IU] | INTRAMUSCULAR | Status: DC | PRN
  Filled 2016-08-24: qty 4

## 2016-08-24 MED ORDER — ALTEPLASE 2 MG IJ SOLR
2.0000 mg | Freq: Once | INTRAMUSCULAR | Status: AC | PRN
  Administered 2016-08-29: 2 mg

## 2016-08-24 MED ORDER — FENTANYL CITRATE (PF) 100 MCG/2ML IJ SOLN: 50.0000 ug | Freq: Once | INTRAMUSCULAR | Status: DC

## 2016-08-24 MED ORDER — LIDOCAINE-PRILOCAINE 2.5-2.5 % EX CREA
1.0000 "application " | TOPICAL_CREAM | CUTANEOUS | Status: DC | PRN
  Filled 2016-08-24: qty 5

## 2016-08-24 MED ORDER — LIDOCAINE HCL (PF) 1 % IJ SOLN: 5.0000 mL | INTRAMUSCULAR | Status: DC | PRN

## 2016-08-24 MED ORDER — SODIUM CHLORIDE 0.9 % IV SOLN: 25.0000 ug/h | INTRAVENOUS | Status: DC

## 2016-08-24 MED ORDER — ALTEPLASE 2 MG IJ SOLR: 2.0000 mg | Freq: Once | INTRAMUSCULAR | Status: DC | PRN

## 2016-08-24 MED ORDER — DOCUSATE SODIUM 50 MG/5ML PO LIQD
100.0000 mg | Freq: Two times a day (BID) | ORAL | Status: DC | PRN
Start: 2016-08-24 — End: 2016-08-29

## 2016-08-24 MED ORDER — CHLORHEXIDINE GLUCONATE CLOTH 2 % EX PADS
6.0000 | MEDICATED_PAD | Freq: Every day | CUTANEOUS | Status: DC
  Administered 2016-08-24 – 2016-08-27 (×4): 6 via TOPICAL

## 2016-08-24 MED ORDER — ALBUTEROL SULFATE (2.5 MG/3ML) 0.083% IN NEBU
2.5000 mg | INHALATION_SOLUTION | RESPIRATORY_TRACT | Status: DC
  Administered 2016-08-24 – 2016-08-27 (×19): 2.5 mg via RESPIRATORY_TRACT
  Filled 2016-08-24 (×19): qty 3

## 2016-08-24 MED ORDER — NALOXONE HCL 0.4 MG/ML IJ SOLN
INTRAMUSCULAR | Status: AC
  Filled 2016-08-24: qty 1

## 2016-08-24 MED ORDER — MIDAZOLAM HCL 2 MG/2ML IJ SOLN
4.0000 mg | Freq: Once | INTRAMUSCULAR | Status: AC
  Administered 2016-08-24: 4 mg via INTRAVENOUS

## 2016-08-24 MED ORDER — LIDOCAINE HCL (PF) 1 % IJ SOLN
5.0000 mL | INTRAMUSCULAR | Status: DC | PRN
  Filled 2016-08-24: qty 5

## 2016-08-24 MED ORDER — ALBUTEROL SULFATE (2.5 MG/3ML) 0.083% IN NEBU: 2.5000 mg | INHALATION_SOLUTION | RESPIRATORY_TRACT | Status: DC

## 2016-08-24 MED ORDER — ETOMIDATE 2 MG/ML IV SOLN
20.0000 mg | Freq: Once | INTRAVENOUS | Status: AC
  Administered 2016-08-24: 20 mg via INTRAVENOUS

## 2016-08-24 MED ORDER — PROPOFOL 1000 MG/100ML IV EMUL
INTRAVENOUS | Status: AC
  Filled 2016-08-24: qty 100

## 2016-08-24 MED ORDER — HEPARIN SODIUM (PORCINE) 1000 UNIT/ML DIALYSIS: 1000.0000 [IU] | INTRAMUSCULAR | Status: DC | PRN

## 2016-08-24 NOTE — Consult Note (Signed)
PULMONARY / CRITICAL CARE MEDICINE   Name: [name] MRN: [id number] DOB: December 09, 1965    ADMISSION DATE:  08/23/2016 CONSULTATION DATE:  08/24/16  REFERRING MD:  Dr. Eulas Post  CHIEF COMPLAINT:  Respiratory failure  HISTORY OF PRESENT ILLNESS:   Please see H&P for full admission details.  Briefly, [name] is a 51 y/o man admitted in the early morning of 08/23/16 with rhabdomyolysis after an ingestion (wife thinks it was unintentional).  Over the course of his admission his renal function continued to decline and he developed worsening azotemia with altered mental status.  He then developed increasing hypoxia thought to be due to fluid overload and he was emergently intubated.  PAST MEDICAL HISTORY :  He  has a past medical history of Accelerated hypertension (08/23/2016); Anxiety; Arthritis; Bipolar affective disorder ([place]); Chronic back pain; Chronic kidney disease; DDD (degenerative disc disease); Depression; DJD (degenerative joint disease); Elevated liver function tests; Fibromyalgia; GERD (gastroesophageal reflux disease); Head injury; Head injury, closed, with concussion; Hyperlipidemia; Hypothyroidism; IBS (irritable bowel syndrome); Inguinal hernia; Insomnia; Myofascial pain syndrome; Osteopenia; Shortness of breath dyspnea; and Tibial plateau fracture, left.  PAST SURGICAL HISTORY: He  has a past surgical history that includes Cervical disc surgery; Septoplasty; Colonoscopy; Spinal cord stimulator implant; ORIF tibia plateau (Left, 10/20/2013); Fasciotomy (Left, 10/20/2013); Inguinal hernia repair (Left); Esophagogastroduodenoscopy; Colonoscopy; Carotid Dopplers (Bilateral, 02/20/2015); transthoracic echocardiogram (02/20/2015); Nuclear Stress Test (03/06/2015); Cardiac catheterization (N/A, 08/31/2015); and Spinal cord stimulator battery exchange (N/A, 02/08/2016).  Allergies  Allergen Reactions  . Zolpidem Tartrate Other (See Comments)    Hallucinations and sleep walks  . Demerol  [Meperidine] Itching and Rash    No current facility-administered medications on file prior to encounter.    Current Outpatient Prescriptions on File Prior to Encounter  Medication Sig  . esomeprazole (NEXIUM) 40 MG capsule Take 40 mg by mouth daily at 12 noon.  Marland Kitchen OLANZapine (ZYPREXA) 7.5 MG tablet Take 7.5 mg by mouth at bedtime.  Marland Kitchen aspirin EC 81 MG tablet Take 81 mg by mouth daily.  . busPIRone (BUSPAR) 7.5 MG tablet Take 7.5 mg by mouth 2 (two) times daily.  Marland Kitchen gabapentin (NEURONTIN) 300 MG capsule Take 300 mg by mouth 3 (three) times daily.  . hydrOXYzine (ATARAX/VISTARIL) 25 MG tablet Take 25 mg by mouth 3 (three) times daily.  Marland Kitchen lamoTRIgine (LAMICTAL) 25 MG tablet Take 1 tablet (25 mg total) by mouth daily. (Patient taking differently: Take 50 mg by mouth daily. )  . levothyroxine (SYNTHROID, LEVOTHROID) 75 MCG tablet Take 75 mcg by mouth daily before breakfast.  . metoprolol tartrate (LOPRESSOR) 25 MG tablet Take 12.5 mg by mouth 2 (two) times daily.  . Oxycodone HCl 10 MG TABS Every 4-6 hours PRN for post-surgical pain  . tiZANidine (ZANAFLEX) 2 MG tablet Take 2 mg by mouth 2 (two) times daily.    FAMILY HISTORY:  His @FAMSTP (<SUBSCRIPT> error)@  SOCIAL HISTORY: He  reports that he has quit smoking. His smoking use included Cigarettes. He has a 35.00 pack-year smoking history. He has never used smokeless tobacco. He reports that he uses drugs, including Oxycodone and Cocaine. He reports that he does not drink alcohol.  REVIEW OF SYSTEMS:   Could not be obtained as pt was obtunded  SUBJECTIVE:  51 y/o man with AKI from rhabdomyolysis resulting in AMS and fluid overload.  Intubated for hypoxic respiratory failure.  VITAL SIGNS: BP (!) 145/83   Pulse 87   Temp 98.8 F (37.1 C) (Oral)   Resp 20  Ht 6' (1.829 m)   Wt 118.2 kg (260 lb 9.6 oz)   SpO2 99%   BMI 35.34 kg/m   HEMODYNAMICS:    VENTILATOR SETTINGS: Vent Mode: PRVC FiO2 (%):  [50 %-100 %] 50 % Set Rate:   [20 bmp] 20 bmp Vt Set:  [902 mL] 620 mL PEEP:  [5 cmH20-8 cmH20] 5 cmH20 Plateau Pressure:  [28 cmH20] 28 cmH20  INTAKE / OUTPUT: I/O last 3 completed shifts: In: 2713.3 [I.V.:2593.3; IV Piggyback:120] Out: 80 [Urine:80]  PHYSICAL EXAMINATION: General:  obtunded Neuro:  obtunded HEENT:  PERRL, ETT in place Cardiovascular:   NRRR, no MRG Lungs:  Rhonchi throughout Abdomen:  Soft, ND, no palpable HSM Musculoskeletal:  No peripheral edema, no joint abnormalities Skin:  No rashes or other lesions  LABS:  BMET  Recent Labs Lab 08/23/16 0301 08/24/16 0409  NA 138 139  K 4.6 4.8  CL 105 101  CO2 15* 22  BUN 106* 108*  CREATININE 10.01* 11.32*  GLUCOSE 117* 168*    Electrolytes  Recent Labs Lab 08/23/16 0301 08/24/16 0409  CALCIUM 6.7* 6.5*    CBC  Recent Labs Lab 08/23/16 0301 08/24/16 0409  WBC 9.3 12.1*  HGB 11.2* 11.2*  HCT 32.6* 33.2*  PLT 98* 182    Coag's No results for input(s): APTT, INR in the last 168 hours.  Sepsis Markers No results for input(s): LATICACIDVEN, PROCALCITON, O2SATVEN in the last 168 hours.  ABG  Recent Labs Lab 08/24/16 0438 08/24/16 0553  PHART 7.157* 7.318*  PCO2ART 61.6* 43.7  PO2ART 87.0 335.0*    Liver Enzymes  Recent Labs Lab 08/23/16 0301 08/24/16 0409  AST 1,001* 494*  ALT 284* 221*  ALKPHOS 71 72  BILITOT 0.9 0.6  ALBUMIN 2.7* 2.5*    Cardiac Enzymes No results for input(s): TROPONINI, PROBNP in the last 168 hours.  Glucose No results for input(s): GLUCAP in the last 168 hours.  Imaging US Abdomen Complete  Result Date: 08/23/2016 CLINICAL DATA:  Patient with abnormal LFTs. EXAM: ABDOMEN ULTRASOUND COMPLETE COMPARISON:  None. FINDINGS: Limited exam secondary to bowel gas and patient's inability to follow commands. Gallbladder: No gallstones or wall thickening visualized. No sonographic Murphy sign noted by sonographer. Common bile duct: Diameter: 5 mm Liver: No focal lesion identified.  Within normal limits in parenchymal echogenicity. IVC: No abnormality visualized. Pancreas: Visualized portion unremarkable. Spleen: Size and appearance within normal limits. Right Kidney: Length: 11.2 cm. Echogenicity within normal limits. No mass or hydronephrosis visualized. Small amount of perinephric fluid. Left Kidney: Length: 12.7 cm. Echogenicity within normal limits. No mass or hydronephrosis visualized. Small amount of perinephric fluid. Abdominal aorta: No aneurysm visualized. Other findings: None. IMPRESSION: Markedly limited exam secondary to bowel gas and patient's inability to cooperate. No acute process within the abdomen. No hydronephrosis. Electronically Signed   By: Lovey Newcomer M.D.   On: 08/23/2016 11:03   Dg Chest Port 1 View  Result Date: 08/24/2016 CLINICAL DATA:  51 year old male with respiratory failure. EXAM: PORTABLE CHEST 1 VIEW COMPARISON:  Radiograph dated 08/23/2016 FINDINGS: Interval placement of an endotracheal tube the tip approximately 4.7 cm above the carina. And enteric tube courses into the left upper abdomen with tip beyond the inferior margin of the image. Right IJ central line remains in stable position. Interval development of small right pleural effusion with associated right lung base atelectasis versus infiltrate. There is no pneumothorax. The cardiac silhouette is within normal limits. No acute osseous pathology. IMPRESSION: 1. Interval placement of  an endotracheal tube with tip above the carina. Enteric tube extends into the left upper abdomen. 2. Small right pleural effusion and right lung base atelectasis/infiltrate, new or progressed since the prior radiograph. No pneumothorax. Electronically Signed   By: Anner Crete M.D.   On: 08/24/2016 05:54     STUDIES:  Abd [place] 3/24 - no acute process  CULTURES:   ANTIBIOTICS:   SIGNIFICANT EVENTS: Intubated 08/24/16  LINES/TUBES: Dialysis cath 3/24 - Right femoral Right IJ CVC - 3/24  DISCUSSION: 51  y/o man with rhabdo 2/2 overdose now with azotemia resulting in altered mental status and fluid overload with hypoxic and hypercarbic respiratory failure  ASSESSMENT / PLAN:  PULMONARY A: Respiratory failure P:   Lung protective ventilation VAP Wean as able   RENAL A:   AKI P:   2/2 rhabdo Starting RRT - per renal  INFECTIOUS A:   At risk for aspiration pna P:   Copious secretions on intubation Monitor for fever   ENDOCRINE A:   hyperglycemia   P:   SSI  NEUROLOGIC A:   Sedated Chronic pain P:   RASS goal: 0 Fentanyl gtt while intubated   FAMILY  - Updates:   - Inter-disciplinary family meet or Palliative Care meeting due by:  09/01/16  I spent 35 minutes of critical care time in the care of this patient seperate from procedures which are documented elsewhere     Pulmonary and [place] Pager: [phone]  08/24/2016, 6:28 AM

## 2016-08-24 NOTE — Progress Notes (Signed)
Dialysis treatment completed.  3500 mL ultrafiltrated.  3000 mL net fluid removal.  Patient status unchanged. Lung sounds diminished to ausculation in all fields. Generalized BLE edema. Cardiac: NSR.  Cleansed RIJ catheter with chlorhexidine.  Disconnected lines and flushed ports with saline per protocol.  Ports locked with heparin and capped per protocol.    Report given to bedside, RN Sarah.

## 2016-08-24 NOTE — Progress Notes (Signed)
Pt seen and examined on morning rounds Admitted with drug OD, Rhabdo, AKI Transferred to ICU, intubated for respiratory failure, acidosis  Blood pressure (!) 145/87, pulse 84, temperature 98.8 F (37.1 C), temperature source Oral, resp. rate 20, height 6' (1.829 m), weight 260 lb 9.6 oz (118.2 kg), SpO2 100 %. Gen:      No acute distress HEENT:  EOMI, sclera anicteric Neck:     No masses; no thyromegaly Lungs:    Clear to auscultation bilaterally; normal respiratory effort CV:         Regular rate and rhythm; no murmurs Abd:      + bowel sounds; soft, non-tender; no palpable masses, no distension Ext:    No edema; adequate peripheral perfusion Skin:      Warm and dry; no rash Neuro: Sedated, unresponsive  Labs and imaging personally reviewed Significant for  Postintubation ABG 7.32/44/335/100% BUN/creatinine-108/1.3 CK-27,000 AST-4.4, ALT-221 WBC 12.1 Chest x-ray-ET tube central in appropriate position, small right effusion.  Assessment/Plan: Drug OD- Possibly zyprexa, vistaril, gabapentin, buspar. H/O bipolar, psychosis Rhabdomyolysis, AKI Resp failure secondary to met, resp acidosis  - Continue full vent support. 8 mL/kg tidal volumes - Wean FiO2/PEEP - Starting hemodialysis per nephrology today - Observe off antibiotics. No evidence of infection - SSI coverage for hyperglycemia - Propofol for sedation. - Will need psych consult when extubated.  Additional critical care time- 35 mins.  The patient is critically ill with multiple organ system failure and requires high complexity decision making for assessment and support, frequent evaluation and titration of therapies, advanced monitoring, review of radiographic studies and interpretation of complex data.   Marshell Garfinkel MD [place] [facility] Pager [phone] If no answer or after 3pm call: [phone] 08/24/2016, 8:02 AM

## 2016-08-24 NOTE — Progress Notes (Signed)
  [facility] Progress Note   Subjective: 5.1 L + yesterday, resp distress early this am and intubated. CXR w CHF and new R effusion.   Vitals:   08/24/16 0700 08/24/16 0707 08/24/16 0757 08/24/16 0800  BP: (!) 145/87 (!) 145/87  (!) 154/85  Pulse: 84 84  93  Resp: _0 Temp:   99.2 F (37.3 C)   TempSrc:   Oral   SpO2: 99% 100%  100%  Weight:      Height:        Inpatient medications: . albuterol  2.5 mg Nebulization Q4H  . aspirin EC  81 mg Oral Daily  . chlorhexidine gluconate (MEDLINE KIT)  15 mL Mouth Rinse BID  . Chlorhexidine Gluconate Cloth  6 each Topical Daily  . fentaNYL (SUBLIMAZE) injection  50 mcg Intravenous Once  . levothyroxine  37.5 mcg Intravenous Daily  . mouth rinse  15 mL Mouth Rinse 10 times per day  . metoprolol  2.5 mg Intravenous Q6H  . pantoprazole (PROTONIX) IV  40 mg Intravenous Q24H  . sodium chloride flush  10-40 mL Intracatheter Q12H  . sodium chloride flush  3 mL Intravenous Q12H   . fentaNYL infusion INTRAVENOUS    . propofol (DIPRIVAN) infusion 5 mcg/kg/min (08/24/16 0800)   sodium chloride, sodium chloride, alteplase, docusate, fentaNYL, heparin, heparin, HYDROmorphone (DILAUDID) injection, lidocaine (PF), lidocaine-prilocaine, ondansetron **OR** ondansetron (ZOFRAN) IV, pentafluoroprop-tetrafluoroeth  Exam: Gen intubated and sedated Sclera anicteric, throat w ETT No jvd or bruits Chest clear bilat and /lat RRR no MRG Abd soft ntnd no mass or ascites +bs obese GU normal male w foley in place, dark urine small amounts MS no joint effusions or deformity Ext trace LE edema Neuro is sedated on vent    Assessment: 1. Acute kidney injury - ATN due to severe rhabdomyolysis.  For HD this am.  Not making much urine.  Should recover over time but not sure how long this will take.  Supportive care w HD for now.   2. Vol excess - worsening CXR today, stopped fluids and will pull volume with HD today and  tomorrow 3. AMS - due to uremia +/- drug overdose 4. Rhabdomyolysis, acute 5. Bipolar d/o w suspected drug OD 6. Schizophrenia 7. Hx suicide attempt in the past 8. Chronic pain   Plan - as above   Kelly Splinter MD [facility] pager [phone]   08/24/2016, 9:13 AM    Recent Labs Lab 08/23/16 0301 08/24/16 0409  NA 138 139  K 4.6 4.8  CL 105 101  CO2 15* 22  GLUCOSE 117* 168*  BUN 106* 108*  CREATININE 10.01* 11.32*  CALCIUM 6.7* 6.5*    Recent Labs Lab 08/23/16 0301 08/24/16 0409  AST 1,001* 494*  ALT 284* 221*  ALKPHOS 71 72  BILITOT 0.9 0.6  PROT 5.4* 5.2*  ALBUMIN 2.7* 2.5*    Recent Labs Lab 08/23/16 0301 08/24/16 0409  WBC 9.3 12.1*  NEUTROABS 7.8*  --   HGB 11.2* 11.2*  HCT 32.6* 33.2*  MCV 83.8 83.8  PLT 98* 182   Iron/TIBC/Ferritin/ %Sat No results found for: IRON, TIBC, FERRITIN, IRONPCTSAT

## 2016-08-24 NOTE — Progress Notes (Signed)
Pt o2 sats going into 80's around 0400.  Started increasing [place]. Turned iv fluids off.  Pt lung sound course.  Pt open eyes to voice and follows little commands.  Placed on NRB at 0426.  Paged [facility] and nephrology.  Also paged CCM.  Pt in resp distress. Urine output has not changed since earlier today.   Moving pt.to ICU to intubate.  Family called.  Pt resp distress started bagging at 0443.  CCM and [facility] at bedside. Code was started for resp distress.  Pt intubated.  Saunders Revel T

## 2016-08-24 NOTE — Progress Notes (Signed)
Patient ID: [name], male   DOB: 1965/11/11, 51 y.o.   MRN: [id number] The patient was seen earlier because of acute respiratory failure with hypercarbia and metabolic acidosis (please see ABG and other labs results below). Critical care medicine was contacted and the patient was subsequently intubated by Dr. Reginia Forts.   98.33F    103  19   154/86  --  98 %   Lungs: decreased breath sounds on bases.  Cardiovascular: S1, S2 tachycardic at 104 BPM, no edema. Abdomen: Soft, nontender. Neuro: Does not respond to verbal stimuli, responds to tactile stimuli.  I-STAT 3, arterial blood gas (G3+) [[id number]] (Abnormal) Collected: 08/24/16 0438  Updated: 08/24/16 0441    pH, Arterial 7.157 (LL)   pCO2 arterial 61.6 (H) mmHg   pO2, Arterial 87.0 mmHg   Bicarbonate 21.8 mmol/L   TCO2 24 mmol/L   O2 Saturation 93.0 %   Acid-base deficit 7.0 (H) mmol/L   Patient temperature HIDE   Collection site RADIAL, ALLEN'S TEST ACCEPTABLE   Drawn by RT   Sample type ARTERIAL   Comment     Comprehensive metabolic panel [[id number]] (Abnormal) Collected: 08/24/16 0409  Updated: 08/24/16 0532   Specimen Type: Blood   Specimen Source: Vein    Sodium 139 mmol/L   Potassium 4.8 mmol/L   Chloride 101 mmol/L   CO2 22 mmol/L   Glucose, Bld 168 (H) mg/dL   BUN 108 (H) mg/dL   Creatinine, Ser 11.32 (H) mg/dL   Calcium 6.5 (L) mg/dL   Total Protein 5.2 (L) g/dL   Albumin 2.5 (L) g/dL   AST 494 (H) U/L   ALT 221 (H) U/L   Alkaline Phosphatase 72 U/L   Total Bilirubin 0.6 mg/dL   GFR calc non Af Amer 5 (L) mL/min   GFR calc Af Amer 5 (L) mL/min   Anion gap 16 (H)  CK [[id number]]    Results pending.. Collected: 08/24/16 0409           Updated: 08/24/16 0524   Specimen Type: Blood   CBC [[id number]] (Abnormal) Collected: 08/24/16 0409  Updated: 08/24/16 0508   Specimen Type: Blood   Specimen Source: Vein    WBC 12.1 (H) K/uL   RBC 3.96 (L) MIL/uL   Hemoglobin 11.2 (L) g/dL   HCT 33.2 (L)  %   MCV 83.8 fL   MCH 28.3 pg   MCHC 33.7 g/dL   RDW 12.8 %   Platelets 182 K/uL   A/P Acute respiratory failure. Acute combined respiratory and metabolic acidosis. Acute renal failure. Rhabdomyolysis. Altered mental status. Hypocalcemia. Anemia.  The patient has been intubated, transferred to ICU. Further management per [facility] nephrology. His wife Carnelius Hammitt was notified of these events at [phone].  Tennis Must, M.D. [phone].

## 2016-08-24 NOTE — Procedures (Signed)
Intubation Procedure Note [name] [id number] November 23, 1965  Procedure: Intubation Indications: Respiratory insufficiency  Procedure Details Consent: Unable to obtain consent because of altered level of consciousness. Time Out: Verified patient identification, verified procedure, site/side was marked, verified correct patient position, special equipment/implants available, medications/allergies/relevent history reviewed, required imaging and test results available.  Performed  Maximum sterile technique was used including gloves and hand hygiene.  MAC and 4    Evaluation Hemodynamic Status: BP stable throughout; O2 sats: stable throughout Patient's Current Condition: stable Complications: No apparent complications Patient did tolerate procedure well. Chest X-ray ordered to verify placement.  CXR: pending.   [name] K. 08/24/2016

## 2016-08-24 NOTE — Progress Notes (Signed)
08/24/16 2000  What Happened  Was fall witnessed? No  Was patient injured? No  Patient found on floor  Found by Staff-comment Marcene Brawn McKibben, RN)  Stated prior activity other (comment) (in bed calm and resting)  Follow Up  MD notified Dr. Diona Fanti   Time MD notified 2010  Family notified Yes-comment  Time family notified 2100  Additional tests Yes-comment  Simple treatment Other (comment) (Chest X-ray completed; Per MD head CT deferred at this time)  Adult Fall Risk Assessment  Risk Factor Category (scoring not indicated) History of more than one fall within 6 months before admission (document High fall risk)  Patient's Fall Risk High Fall Risk (>13 points)  Adult Fall Risk Interventions  Required Bundle Interventions *See Row Information* High fall risk - low, moderate, and high requirements implemented  Additional Interventions Use of appropriate toileting equipment (bedpan, BSC, etc.);Room near nurses station;Individualized elimination schedule  Screening for Fall Injury Risk  Risk For Fall Injury- See Row Information  F;Nurse judgement  Injury Prevention Interventions Floor Mat  Vitals  BP (!) 156/71  MAP (mmHg) 92  BP Location Right Arm  BP Method Automatic  Patient Position (if appropriate) Lying  Pulse Rate 100  Pulse Rate Source Monitor  ECG Heart Rate 100  Resp 20  Oxygen Therapy  SpO2 96 %  O2 Device Ventilator  FiO2 (%) 40 %  Pain Assessment  Pain Assessment CPOT  Critical Care Pain Observation Tool (CPOT)  Facial Expression 2  Body Movements 2  Muscle Tension 2  Compliance with ventilator (intubated pts.) 1  Vocalization (extubated pts.) N/A  CPOT Total 7  PCA/Epidural/Spinal Assessment  Respiratory Pattern Regular;Unlabored;Symmetrical  Neurological  Neuro (WDL) X  Level of Consciousness Responds to Voice  Orientation Level Intubated/Tracheostomy - Unable to assess  Cognition Follows commands;Poor attention/concentration;Poor judgement;Poor safety  awareness  Speech Intubated/Tracheostomy - Unable to assess  Pupil Assessment  Yes  R Pupil Size (mm) 3  R Pupil Shape Round  R Pupil Reaction Sluggish  L Pupil Size (mm) 3  L Pupil Shape Round  L Pupil Reaction Sluggish  Additional Pupil Assessments No  Motor Function/Sensation Assessment Grip  R Hand Grip Present;Moderate  L Hand Grip Present;Moderate   Neuro Symptoms Tremors  Glasgow Coma Scale  Eye Opening 3  Modified Verbal Response (INTUBATED) 3  Best Motor Response 6  Glasgow Coma Scale Score 12  Musculoskeletal  Musculoskeletal (WDL) X  Assistive Device MaxiSlide  Generalized Weakness Yes  Weight Bearing Restrictions No  Integumentary  Integumentary (WDL) X  Skin Color Appropriate for ethnicity  Skin Condition Dry  Skin Integrity Rash  Rash Location Abdomen;Back  Rash Location Orientation Mid;Upper;Lower  Rash Intervention Cleansed  Skin Turgor Non-tenting    Pt given 4mg  Versed per MD.  Vitals stable and mentation intact.  CXR to comfirm ETT placement.  Per MD, no head CT at this time.  Family notified.  Will continue to monitor closely throughout night.    Mosie Lukes, RN

## 2016-08-24 NOTE — Progress Notes (Signed)
Patient admitted with AKI (Cr >10) and rhabdo.  He received more than 6L of fluid. Overnight, patient experienced respiratory distress and acidosis and was intubated. Nephrology consulted and currently following, suspect patient will need hemodialysis. PCCM consulted and appreciated, currently managing patient while in the ICU and intubated. TRH will sign off.  Time spent: 15 minutes  Krissie Merrick D.O. [facility] Pager [phone]  If 7PM-7AM, please contact night-coverage [email or web address] Password [email or web address] 08/24/2016, 11:30 AM

## 2016-08-24 NOTE — Progress Notes (Signed)
Arrived to patient room 4N-15.  Reviewed treatment plan and this RN agrees with plan.  Report received from bedside RN, Judson Roch.    Patient intubated and sedated.   Lung sounds diminished to ausculation in all fields. BLE generalized edema. Cardiac:  NSR.  Removed caps and cleansed R femoral catheter with chlorhedxidine.  Aspirated ports of heparin and flushed them with saline per protocol.  Connected and secured lines, initiated treatment at 1000.  UF Goal of 3500 mL and net fluid removal 3 L.  Will continue to monitor.

## 2016-08-25 ENCOUNTER — Inpatient Hospital Stay (HOSPITAL_COMMUNITY): Payer: 59

## 2016-08-25 DIAGNOSIS — J9601 Acute respiratory failure with hypoxia: Secondary | ICD-10-CM

## 2016-08-25 DIAGNOSIS — G9340 Encephalopathy, unspecified: Secondary | ICD-10-CM

## 2016-08-25 DIAGNOSIS — T50901D Poisoning by unspecified drugs, medicaments and biological substances, accidental (unintentional), subsequent encounter: Secondary | ICD-10-CM

## 2016-08-25 LAB — CBC
HEMATOCRIT: 29.6 % — AB (ref 39.0–52.0)
HEMOGLOBIN: 10.2 g/dL — AB (ref 13.0–17.0)
MCH: 28.2 pg (ref 26.0–34.0)
MCHC: 34.5 g/dL (ref 30.0–36.0)
MCV: 81.8 fL (ref 78.0–100.0)
Platelets: 172 10*3/uL (ref 150–400)
RBC: 3.62 MIL/uL — ABNORMAL LOW (ref 4.22–5.81)
RDW: 12.9 % (ref 11.5–15.5)
WBC: 8.1 10*3/uL (ref 4.0–10.5)

## 2016-08-25 LAB — BASIC METABOLIC PANEL
ANION GAP: 15 (ref 5–15)
BUN: 70 mg/dL — ABNORMAL HIGH (ref 6–20)
CALCIUM: 7.5 mg/dL — AB (ref 8.9–10.3)
CHLORIDE: 99 mmol/L — AB (ref 101–111)
CO2: 24 mmol/L (ref 22–32)
Creatinine, Ser: 8.94 mg/dL — ABNORMAL HIGH (ref 0.61–1.24)
GFR calc non Af Amer: 6 mL/min — ABNORMAL LOW (ref >60)
GFR, EST AFRICAN AMERICAN: 7 mL/min — AB (ref >60)
Glucose, Bld: 103 mg/dL — ABNORMAL HIGH (ref 65–99)
Potassium: 2.9 mmol/L — ABNORMAL LOW (ref 3.5–5.1)
Sodium: 138 mmol/L (ref 135–145)

## 2016-08-25 LAB — POCT I-STAT 3, ART BLOOD GAS (G3+)
Acid-Base Excess: 1 mmol/L (ref 0.0–2.0)
BICARBONATE: 23.3 mmol/L (ref 20.0–28.0)
O2 Saturation: 95 %
PO2 ART: 66 mmHg — AB (ref 83.0–108.0)
TCO2: 24 mmol/L (ref 0–100)
pCO2 arterial: 29.3 mmHg — ABNORMAL LOW (ref 32.0–48.0)
pH, Arterial: 7.509 — ABNORMAL HIGH (ref 7.350–7.450)

## 2016-08-25 LAB — URINALYSIS, ROUTINE W REFLEX MICROSCOPIC
BILIRUBIN URINE: NEGATIVE
GLUCOSE, UA: 50 mg/dL — AB
KETONES UR: NEGATIVE mg/dL
NITRITE: NEGATIVE
Protein, ur: 100 mg/dL — AB
Specific Gravity, Urine: 1.012 (ref 1.005–1.030)
pH: 5 (ref 5.0–8.0)

## 2016-08-25 LAB — PHOSPHORUS: PHOSPHORUS: 5 mg/dL — AB (ref 2.5–4.6)

## 2016-08-25 LAB — POTASSIUM: Potassium: 3.5 mmol/L (ref 3.5–5.1)

## 2016-08-25 LAB — MAGNESIUM: Magnesium: 2.1 mg/dL (ref 1.7–2.4)

## 2016-08-25 LAB — GLUCOSE, CAPILLARY: Glucose-Capillary: 98 mg/dL (ref 65–99)

## 2016-08-25 MED ORDER — ROCURONIUM BROMIDE 50 MG/5ML IV SOLN
25.0000 mg | Freq: Once | INTRAVENOUS | Status: AC
  Administered 2016-08-25: 25 mg via INTRAVENOUS
  Filled 2016-08-25: qty 2.5

## 2016-08-25 MED ORDER — SODIUM CHLORIDE 0.9 % IV SOLN
Freq: Once | INTRAVENOUS | Status: AC
  Administered 2016-08-25: 09:00:00 via INTRAVENOUS
  Filled 2016-08-25: qty 500

## 2016-08-25 MED ORDER — MIDAZOLAM HCL 2 MG/2ML IJ SOLN
INTRAMUSCULAR | Status: AC
  Administered 2016-08-25: 2 mg
  Filled 2016-08-25: qty 2

## 2016-08-25 MED ORDER — FENTANYL CITRATE (PF) 100 MCG/2ML IJ SOLN
INTRAMUSCULAR | Status: AC
  Administered 2016-08-25: 100 ug
  Filled 2016-08-25: qty 2

## 2016-08-25 MED ORDER — ETOMIDATE 2 MG/ML IV SOLN
20.0000 mg | Freq: Once | INTRAVENOUS | Status: AC
  Administered 2016-08-25: 20 mg via INTRAVENOUS

## 2016-08-25 MED FILL — Medication: Qty: 1 | Status: AC

## 2016-08-25 NOTE — Progress Notes (Signed)
eLink Physician-Brief Progress Note Patient Name: XZAVIOR REINIG DOB: 1966-01-04 MRN: [id number]   Date of Service  08/25/2016  HPI/Events of Note   Recent Labs Lab 08/24/16 0438 08/24/16 0553 08/25/16 0359  PHART 7.157* 7.318* 7.509*  PCO2ART 61.6* 43.7 29.3*  PO2ART 87.0 335.0* 66.0*  HCO3 21.8 22.4 23.3  TCO2 24 24 24   O2SAT 93.0 100.0 95.0     eICU Interventions  Reduce RR on vent from 20 to 14     Intervention Category Major Interventions: Acid-Base disturbance - evaluation and management  Marissa Weaver 08/25/2016, 5:09 AM

## 2016-08-25 NOTE — Procedures (Signed)
Intubation Procedure Note DORRIEN GRUNDER [id number] [birth year]  Procedure: Intubation Indications: Respiratory insufficiency  Procedure Details Consent: Unable to obtain consent because of emergent medical necessity. Time Out: Verified patient identification, verified procedure, site/side was marked, verified correct patient position, special equipment/implants available, medications/allergies/relevent history reviewed, required imaging and test results available.  Performed  Maximum sterile technique was used including antiseptics, gloves, hand hygiene and mask.  MAC    Evaluation Hemodynamic Status: BP stable throughout; O2 sats: stable throughout Patient's Current Condition: stable Complications: No apparent complications Patient did tolerate procedure well. Chest X-ray ordered to verify placement.  CXR: pending.   Jennet Maduro 08/25/2016

## 2016-08-25 NOTE — Procedures (Signed)
Extubation Procedure Note  Patient Details:   Name: [name] DOB: 1966-04-03 MRN: [id number]   Airway Documentation:     Evaluation  O2 sats: stable throughout Complications: No apparent complications Patient did tolerate procedure well. Bilateral Breath Sounds: Clear, Diminished   Yes     [name] 08/25/2016, 9:30 AM

## 2016-08-25 NOTE — Progress Notes (Signed)
[facility] Progress Note   Subjective:  Had HD yesterday for 5L + balance/CHF Had 3 liters removed Due for HD again today K low and getting IV potassium Just extubated but not moving air well, weak cough Saying does not want reintubation  Vitals:   08/25/16 0757 08/25/16 0800 08/25/16 0833 08/25/16 0929  BP:  (!) 141/66    Pulse:  97  94  Resp:  14  16  Temp: 97.6 F (36.4 C)     TempSrc: Oral     SpO2:  95% 95% 93%  Weight:      Height:        Exam: VS as noted above Maculopapular rash on arms,, legs Extubated but very weak cough  Sclera anicteric No jvd or bruits Chest with coarse rhonchi anteriorly Abd soft and not tender Foley in place, small amt urine only Trace edema and SCD's are in place Neuro is lethargic, but awakens, "why did you do this to me?"    Inpatient medications: . albuterol  2.5 mg Nebulization Q4H  . aspirin EC  81 mg Oral Daily  . chlorhexidine gluconate (MEDLINE KIT)  15 mL Mouth Rinse BID  . Chlorhexidine Gluconate Cloth  6 each Topical Daily  . fentaNYL (SUBLIMAZE) injection  50 mcg Intravenous Once  . levothyroxine  37.5 mcg Intravenous Daily  . mouth rinse  15 mL Mouth Rinse 10 times per day  . metoprolol  2.5 mg Intravenous Q6H  . pantoprazole (PROTONIX) IV  40 mg Intravenous Q24H  . sodium chloride flush  10-40 mL Intracatheter Q12H  . sodium chloride flush  3 mL Intravenous Q12H   . fentaNYL infusion INTRAVENOUS    . propofol (DIPRIVAN) infusion 5 mcg/kg/min (08/25/16 0700)   sodium chloride, sodium chloride, sodium chloride, sodium chloride, alteplase, docusate, fentaNYL, heparin, heparin, heparin, HYDROmorphone (DILAUDID) injection, lidocaine (PF), lidocaine-prilocaine, ondansetron **OR** ondansetron (ZOFRAN) IV, pentafluoroprop-tetrafluoroeth   Recent Labs Lab 08/24/16 0409 08/24/16 1534 08/25/16 0425  NA 139 137 138  K 4.8 3.3* 2.9*  CL 101 99* 99*  CO2 '22 23 24  ' GLUCOSE 168* 104* 103*  BUN 108*  57* 70*  CREATININE 11.32* 7.64* 8.94*  CALCIUM 6.5* 7.4* 7.5*  PHOS  --  4.4 5.0*    Recent Labs Lab 08/23/16 0301 08/24/16 0409 08/24/16 1534  AST 1,001* 494*  --   ALT 284* 221*  --   ALKPHOS 71 72  --   BILITOT 0.9 0.6  --   PROT 5.4* 5.2*  --   ALBUMIN 2.7* 2.5* 2.6*    Recent Labs Lab 08/23/16 0301 08/24/16 0409 08/25/16 0425  WBC 9.3 12.1* 8.1  NEUTROABS 7.8*  --   --   HGB 11.2* 11.2* 10.2*  HCT 32.6* 33.2* 29.6*  MCV 83.8 83.8 81.8  PLT 98* 182 172   Results for Travis, Boyd (MRN [id number]) as of 08/25/2016 09:40  Ref. Range 08/23/2016 03:01 08/24/2016 04:09  CK Total Latest Ref Range: 49 - 397 U/L >50,000 (H) 26,910 (H)   51 y.o. year-old with history of bipolar d/o, schizophrenia, chronic pain who presented to outside hospital after being found down at home by his family.  Taken to ED where labs showed high BUN/ Cr. (BUN >100, creatinine 10). (Baseline creatinine 1.12 on 02/08/16).  Asked to see for renal failure. (Has history of rhabdo, AKI, bipolar d/o, suicide attempt (2011), syncope (2005) in the past). HD initiated 08/24/16  Assessment: 1. Acute kidney injury - ATN due to severe  rhabdomyolysis.  s/p HD #1 yesterday, 3 liters off. Minimal UOP. For HD again today.  Supportive care w HD for now.   2. Vol excess - worsening CXR yesterday. 3L off w/HD yesterday, for another 3 liters today 3. Hypokalemia - getting 40 K IV now. Check pre HD K. 4K bath. 4. AMS - due to uremia +/- drug overdose 5. Rhabdomyolysis, acute 6. Bipolar d/o w suspected drug OD 7. Schizophrenia 8. Hx suicide attempt in the past 9. Chronic pain   Travis Maes, MD [facility] [phone] Pager 08/25/2016, 9:39 AM

## 2016-08-25 NOTE — Progress Notes (Signed)
PULMONARY / CRITICAL CARE MEDICINE   Name: [name] MRN: [id number] DOB: December 30, 1965    ADMISSION DATE:  08/23/2016 CONSULTATION DATE:  08/24/16  REFERRING MD:  Dr. Eulas Post  CHIEF COMPLAINT:  Respiratory failure  HISTORY OF PRESENT ILLNESS:   Please see H&P for full admission details.  Briefly, [name] is a 51 y/o man admitted in the early morning of 08/23/16 with rhabdomyolysis after an ingestion (wife thinks it was unintentional).  Over the course of his admission his renal function continued to decline and he developed worsening azotemia with altered mental status.  He then developed increasing hypoxia thought to be due to fluid overload and he was emergently intubated.  SUBJECTIVE:  51 y/o man with AKI from rhabdomyolysis resulting in AMS and fluid overload.  Intubated for hypoxic respiratory failure.  VITAL SIGNS: BP (!) 141/66   Pulse 97   Temp 97.6 F (36.4 C) (Oral)   Resp 14   Ht 6' (1.829 m)   Wt 115.2 kg (253 lb 15.5 oz)   SpO2 95%   BMI 34.44 kg/m   HEMODYNAMICS:    VENTILATOR SETTINGS: Vent Mode: PRVC FiO2 (%):  [40 %-50 %] 40 % Set Rate:  [14 bmp-20 bmp] 14 bmp Vt Set:  [620 mL] 620 mL PEEP:  [5 cmH20] 5 cmH20 Plateau Pressure:  [17 cmH20-21 cmH20] 17 cmH20  INTAKE / OUTPUT: I/O last 3 completed shifts: In: 2982.3 [I.V.:2982.3] Out: 3205 [Urine:205; Other:3000]  PHYSICAL EXAMINATION: General:  Arousable, moving all ext to command Neuro:  Moving all ext to command, arousable HEENT:  [place]/AT, PERRL, ETT in place Cardiovascular:   RRR, Nl S1/S2, -M/R/G. Lungs:  Coarse BS diffusely Abdomen:  Soft, ND, no palpable HSM Musculoskeletal:  No peripheral edema, no joint abnormalities Skin:  No rashes or other lesions  LABS:  BMET  Recent Labs Lab 08/24/16 0409 08/24/16 1534 08/25/16 0425  NA 139 137 138  K 4.8 3.3* 2.9*  CL 101 99* 99*  CO2 22 23 24   BUN 108* 57* 70*  CREATININE 11.32* 7.64* 8.94*  GLUCOSE 168* 104* 103*    Electrolytes  Recent Labs Lab 08/24/16 0409 08/24/16 1534 08/25/16 0425  CALCIUM 6.5* 7.4* 7.5*  MG  --   --  2.1  PHOS  --  4.4 5.0*    CBC  Recent Labs Lab 08/23/16 0301 08/24/16 0409 08/25/16 0425  WBC 9.3 12.1* 8.1  HGB 11.2* 11.2* 10.2*  HCT 32.6* 33.2* 29.6*  PLT 98* 182 172    Coag's No results for input(s): APTT, INR in the last 168 hours.  Sepsis Markers No results for input(s): LATICACIDVEN, PROCALCITON, O2SATVEN in the last 168 hours.  ABG  Recent Labs Lab 08/24/16 0438 08/24/16 0553 08/25/16 0359  PHART 7.157* 7.318* 7.509*  PCO2ART 61.6* 43.7 29.3*  PO2ART 87.0 335.0* 66.0*    Liver Enzymes  Recent Labs Lab 08/23/16 0301 08/24/16 0409 08/24/16 1534  AST 1,001* 494*  --   ALT 284* 221*  --   ALKPHOS 71 72  --   BILITOT 0.9 0.6  --   ALBUMIN 2.7* 2.5* 2.6*    Cardiac Enzymes No results for input(s): TROPONINI, PROBNP in the last 168 hours.  Glucose  Recent Labs Lab 08/25/16 0759  GLUCAP 98    Imaging Dg Chest Port 1 View  Result Date: 08/24/2016 CLINICAL DATA:  Intubated EXAM: PORTABLE CHEST 1 VIEW COMPARISON:  08/24/2016 FINDINGS: Cardiomediastinal silhouette is stable. Endotracheal tube in place with tip 3.2 cm above the  carina. Stable NG tube position. Spinal stimulator wires mid thoracic spine again noted. Stable right IJ central line position. Improvement in aeration without pulmonary edema. Mild basilar atelectasis. Mild residual atelectasis right base. No segmental infiltrate. No pneumothorax. IMPRESSION: Endotracheal tube in place with tip 3.2 cm above the carina. Stable NG tube position. Spinal stimulator wires mid thoracic spine again noted. Stable right IJ central line position. Improvement in aeration without pulmonary edema. Mild basilar atelectasis. Mild residual atelectasis right base. No segmental infiltrate. No pneumothorax. Electronically Signed   By: Lahoma Crocker M.D.   On: 08/24/2016 21:06     STUDIES:  Abd  [place] 3/24 - no acute process  CULTURES:   ANTIBIOTICS:   SIGNIFICANT EVENTS: Intubated 08/24/16  LINES/TUBES: Dialysis cath 3/24 - Right femoral Right IJ CVC - 3/24  DISCUSSION: 51 y/o man with rhabdo 2/2 overdose now with azotemia resulting in altered mental status and fluid overload with hypoxic and hypercarbic respiratory failure  ASSESSMENT / PLAN:  PULMONARY A: Respiratory failure P:   SBT to extubate now IS per RT protocol Titrate O2 for sat of 88-92% Ambulate  RENAL A:   AKI P:   2/2 rhabdo Dialysis per renal Replace electrolytes as indicated BMET in AM  INFECTIOUS A:   At risk for aspiration pna P:   Monitor for fever Follow WBC  ENDOCRINE A:   hyperglycemia   P:   SSI CBGs  NEUROLOGIC A:   Sedated Chronic pain P:   RASS goal: 0 D/C fentanyl and propofol for extubation  FAMILY  - Updates: No family bedside  - Inter-disciplinary family meet or Palliative Care meeting due by:  09/01/16  The patient is critically ill with multiple organ systems failure and requires high complexity decision making for assessment and support, frequent evaluation and titration of therapies, application of advanced monitoring technologies and extensive interpretation of multiple databases.   Critical Care Time devoted to patient care services described in this note is  35  Minutes. This time reflects time of care of this signee Dr Jennet Maduro. This critical care time does not reflect procedure time, or teaching time or supervisory time of PA/NP/Med student/Med Resident etc but could involve care discussion time.  Rush Farmer, M.D. [facility] Pulmonary/Critical Care Medicine. Pager: [phone]. After hours pager: [phone].  08/25/2016, 8:32 AM

## 2016-08-25 NOTE — Consult Note (Signed)
   [facility] CM Inpatient Consult   08/25/2016  [name] 06/14/1965 [id number]     [name] screened for [facility] Care Mangement/ Link to Wellness program for [facility] employees/dependents with [facility] insurance. Noted he is currently in ICU. Will engage when appropriate.   Marthenia Rolling, MSN-Ed, RN,BSN [facility] Liaison [phone]

## 2016-08-26 ENCOUNTER — Inpatient Hospital Stay (HOSPITAL_COMMUNITY): Payer: 59

## 2016-08-26 LAB — CBC
HCT: 32.1 % — ABNORMAL LOW (ref 39.0–52.0)
Hemoglobin: 11.1 g/dL — ABNORMAL LOW (ref 13.0–17.0)
MCH: 28.9 pg (ref 26.0–34.0)
MCHC: 34.6 g/dL (ref 30.0–36.0)
MCV: 83.6 fL (ref 78.0–100.0)
Platelets: 160 10*3/uL (ref 150–400)
RBC: 3.84 MIL/uL — ABNORMAL LOW (ref 4.22–5.81)
RDW: 13.4 % (ref 11.5–15.5)
WBC: 8 10*3/uL (ref 4.0–10.5)

## 2016-08-26 LAB — CK: CK TOTAL: 3821 U/L — AB (ref 49–397)

## 2016-08-26 LAB — RENAL FUNCTION PANEL
Albumin: 2.3 g/dL — ABNORMAL LOW (ref 3.5–5.0)
Anion gap: 15 (ref 5–15)
BUN: 52 mg/dL — ABNORMAL HIGH (ref 6–20)
CHLORIDE: 98 mmol/L — AB (ref 101–111)
CO2: 24 mmol/L (ref 22–32)
Calcium: 8.2 mg/dL — ABNORMAL LOW (ref 8.9–10.3)
Creatinine, Ser: 7.8 mg/dL — ABNORMAL HIGH (ref 0.61–1.24)
GFR, EST AFRICAN AMERICAN: 8 mL/min — AB (ref >60)
GFR, EST NON AFRICAN AMERICAN: 7 mL/min — AB (ref >60)
Glucose, Bld: 91 mg/dL (ref 65–99)
PHOSPHORUS: 6.2 mg/dL — AB (ref 2.5–4.6)
Potassium: 3.4 mmol/L — ABNORMAL LOW (ref 3.5–5.1)
Sodium: 137 mmol/L (ref 135–145)

## 2016-08-26 LAB — MAGNESIUM: MAGNESIUM: 2.3 mg/dL (ref 1.7–2.4)

## 2016-08-26 LAB — GLUCOSE, CAPILLARY: GLUCOSE-CAPILLARY: 85 mg/dL (ref 65–99)

## 2016-08-26 MED ORDER — ZOLPIDEM TARTRATE 5 MG PO TABS: 5.0000 mg | ORAL_TABLET | Freq: Every evening | ORAL | Status: DC | PRN

## 2016-08-26 NOTE — Progress Notes (Signed)
PULMONARY / CRITICAL CARE MEDICINE   Name: [name] MRN: [id number] DOB: [birth year]    ADMISSION DATE:  08/23/2016 CONSULTATION DATE:  08/24/16  REFERRING MD:  Dr. Eulas Post  CHIEF COMPLAINT:  Respiratory failure  HISTORY OF PRESENT ILLNESS:   Please see H&P for full admission details.  Briefly, [name] is a 51 y/o man admitted in the early morning of 08/23/16 with rhabdomyolysis after an ingestion (wife thinks it was unintentional).  Over the course of his admission his renal function continued to decline and he developed worsening azotemia with altered mental status.  He then developed increasing hypoxia thought to be due to fluid overload and he was emergently intubated.  SUBJECTIVE:  Patient was extubated 3/26 then failed due to airway protection concerns and was reintubated 3 hours later.  Weaning this AM with no events overnight.  VITAL SIGNS: BP (!) 142/86 (BP Location: Right Arm)   Pulse 98   Temp 98.5 F (36.9 C) (Oral)   Resp 15   Ht 6' (1.829 m)   Wt 111.2 kg (245 lb 2.4 oz)   SpO2 97%   BMI 33.25 kg/m   HEMODYNAMICS:    VENTILATOR SETTINGS: Vent Mode: PSV;CPAP FiO2 (%):  [40 %-100 %] 40 % Set Rate:  [14 bmp] 14 bmp Vt Set:  [620 mL] 620 mL PEEP:  [5 cmH20-10 cmH20] 5 cmH20 Pressure Support:  [5 cmH20-10 cmH20] 5 cmH20 Plateau Pressure:  [15 cmH20-21 cmH20] 19 cmH20  INTAKE / OUTPUT: I/O last 3 completed shifts: In: 337.3 [I.V.:337.3] Out: 3250 [Urine:250; Other:3000]  PHYSICAL EXAMINATION: General:  Arousable, moving all ext to command but remains lethargic Neuro:  Moving all ext to command, lethargic but arousable HEENT:  [place]/AT, PERRL, ETT in place Cardiovascular:   RRR, Nl S1/S2, -M/R/G. Lungs:  Coarse BS diffusely Abdomen:  Soft, ND, no palpable HSM Musculoskeletal:  No peripheral edema, no joint abnormalities Skin:  No rashes or other lesions  LABS:  BMET  Recent Labs Lab 08/24/16 1534 08/25/16 0425 08/25/16 1112 08/26/16 0425  NA  137 138  --  137  K 3.3* 2.9* 3.5 3.4*  CL 99* 99*  --  98*  CO2 23 24  --  24  BUN 57* 70*  --  52*  CREATININE 7.64* 8.94*  --  7.80*  GLUCOSE 104* 103*  --  91   Electrolytes  Recent Labs Lab 08/24/16 1534 08/25/16 0425 08/26/16 0425  CALCIUM 7.4* 7.5* 8.2*  MG  --  2.1 2.3  PHOS 4.4 5.0* 6.2*    CBC  Recent Labs Lab 08/24/16 0409 08/25/16 0425 08/26/16 0425  WBC 12.1* 8.1 8.0  HGB 11.2* 10.2* 11.1*  HCT 33.2* 29.6* 32.1*  PLT 182 172 160    Coag's No results for input(s): APTT, INR in the last 168 hours.  Sepsis Markers No results for input(s): LATICACIDVEN, PROCALCITON, O2SATVEN in the last 168 hours.  ABG  Recent Labs Lab 08/24/16 0438 08/24/16 0553 08/25/16 0359  PHART 7.157* 7.318* 7.509*  PCO2ART 61.6* 43.7 29.3*  PO2ART 87.0 335.0* 66.0*    Liver Enzymes  Recent Labs Lab 08/23/16 0301 08/24/16 0409 08/24/16 1534 08/26/16 0425  AST 1,001* 494*  --   --   ALT 284* 221*  --   --   ALKPHOS 71 72  --   --   BILITOT 0.9 0.6  --   --   ALBUMIN 2.7* 2.5* 2.6* 2.3*    Cardiac Enzymes No results for input(s): TROPONINI, PROBNP in  the last 168 hours.  Glucose  Recent Labs Lab 08/25/16 0759  GLUCAP 98    Imaging Dg Chest Port 1 View  Result Date: 08/25/2016 CLINICAL DATA:  Endotracheal tube placement EXAM: PORTABLE CHEST 1 VIEW COMPARISON:  08/24/2016 FINDINGS: Endotracheal tube in good position. Central venous catheter tip in the SVC in good position and unchanged. No pneumothorax. Spinal cord stimulator unchanged in the thoracic spine. Progression of bibasilar airspace disease which may be atelectasis or pneumonia. No heart failure or a fusion IMPRESSION: Endotracheal tube in good position Progression of bibasilar atelectasis/ infiltrate. Electronically Signed   By: Franchot Gallo M.D.   On: 08/25/2016 11:51     STUDIES:  Abd [place] 3/24 - no acute process  CULTURES:   ANTIBIOTICS:   SIGNIFICANT EVENTS: Intubated  08/24/16  LINES/TUBES: Dialysis cath 3/24 - Right femoral Right IJ CVC - 3/24  DISCUSSION: 51 y/o man with rhabdo 2/2 overdose now with azotemia resulting in altered mental status and fluid overload with hypoxic and hypercarbic respiratory failure  ASSESSMENT / PLAN:  PULMONARY A: Respiratory failure P:   PS trials but no extubation until mental status improves. Titrate O2 for sat of 88-92%. Will need to determine airway protection status post extubation.  RENAL A:   AKI - due to rhabdomyolysis P:   Dialysis per renal Replace electrolytes as indicated BMET in AM [facility] IVF  INFECTIOUS A:   At risk for aspiration pna P:   Monitor for fever Follow WBC  ENDOCRINE A:   hyperglycemia   P:   SSI CBGs  NEUROLOGIC A:   Sedated Chronic pain P:   RASS goal: 0 to -1. Off fentanyl, on propofol, minimize as able  FAMILY  - Updates: No family bedside  - Inter-disciplinary family meet or Palliative Care meeting due by:  09/01/16  The patient is critically ill with multiple organ systems failure and requires high complexity decision making for assessment and support, frequent evaluation and titration of therapies, application of advanced monitoring technologies and extensive interpretation of multiple databases.   Critical Care Time devoted to patient care services described in this note is  35  Minutes. This time reflects time of care of this signee Dr Jennet Maduro. This critical care time does not reflect procedure time, or teaching time or supervisory time of PA/NP/Med student/Med Resident etc but could involve care discussion time.  Rush Farmer, M.D. [facility] Pulmonary/Critical Care Medicine. Pager: [phone]. After hours pager: [phone].  08/26/2016, 8:57 AM

## 2016-08-26 NOTE — Progress Notes (Signed)
RT note- re started wean, tolerating well.

## 2016-08-26 NOTE — Progress Notes (Signed)
Patient transported from 4N15 to room 0X65 without complications.

## 2016-08-26 NOTE — Progress Notes (Signed)
Initial Nutrition Assessment  DOCUMENTATION CODES:   Obesity unspecified  INTERVENTION:   If pt remains intubated recommend initiating the Adult Tube Feeding Protocol Vital High Protein @ 20 ml/hr (480 ml/day) 60 ml Prostat QID Provides: 1280 kcal, 162 grams protein, and 401 ml free water TF regimen and propofol at current rate providing 1559 total kcal/day   NUTRITION DIAGNOSIS:   Inadequate oral intake related to inability to eat as evidenced by NPO status.  GOAL:   Provide needs based on ASPEN/SCCM guidelines  MONITOR:   I & O's, Vent status  REASON FOR ASSESSMENT:   Ventilator    ASSESSMENT:   51 y/o man with rhabdo 2/2 overdose now with azotemia resulting in altered mental status and fluid overload with hypoxic and hypercarbic respiratory failure  Patient is currently intubated on ventilator support Sitter at bedside Pt opens eyes but unable to answer any questions Nephrology following for AKI (ATN due to severe rhabdomyolysis), HD x 2 (3/25 and 3/26), weight down 7 kg, UOP minimal, may need repeat HD tomorrow  Propofol: 10.6 ml/hr provides 279 kcal per day from lipid Labs reviewed: 3.4, PO4 6.2  Diet Order:  Diet NPO time specified  Skin:  Reviewed, no issues  Last BM:  unknown  Height:   Ht Readings from Last 1 Encounters:  08/23/16 6' (1.829 m)    Weight:   Wt Readings from Last 1 Encounters:  08/26/16 245 lb 2.4 oz (111.2 kg)  Usual weight 225 lb (102.1 kg)  Ideal Body Weight:  80.9 kg  BMI:  Body mass index is 33.25 kg/m.  Estimated Nutritional Needs:   Kcal:  3329-5188  Protein:  >/= 161 grams  Fluid:  > 1.5 L/day  EDUCATION NEEDS:   No education needs identified at this time  [address], [place], [place] Pager [phone] After Hours Pager

## 2016-08-26 NOTE — Progress Notes (Signed)
[facility] Progress Note   Subjective:  s/p HD X 2 (3/25, 3/26) Reintubated for airway protection Making minimal urine CPK coming down  Vitals:   08/26/16 0800 08/26/16 0822 08/26/16 0900 08/26/16 1123  BP: (!) 142/86  (!) 152/92   Pulse: 98  (!) 101   Resp: 15  11   Temp:      TempSrc:      SpO2: 98% 97% 97% 96%  Weight:      Height:        Exam: VS as noted above Maculopapular rash on arms, legs looks unchanged (he says acne??) Intubated, sleepy (on large dose of propofol and fentanyl yet still able to awaken... And write on communication board) Sclera anicteric No jvd or bruits Chest with coarse rhonchi anteriorly Abd soft and not tender Foley in place, small amt urine only - brown No edema of LE's R fem HD cath dsg intact (3/24)    Inpatient medications: . albuterol  2.5 mg Nebulization Q4H  . aspirin EC  81 mg Oral Daily  . chlorhexidine gluconate (MEDLINE KIT)  15 mL Mouth Rinse BID  . Chlorhexidine Gluconate Cloth  6 each Topical Daily  . fentaNYL (SUBLIMAZE) injection  50 mcg Intravenous Once  . levothyroxine  37.5 mcg Intravenous Daily  . mouth rinse  15 mL Mouth Rinse 10 times per day  . metoprolol  2.5 mg Intravenous Q6H  . pantoprazole (PROTONIX) IV  40 mg Intravenous Q24H  . sodium chloride flush  10-40 mL Intracatheter Q12H  . sodium chloride flush  3 mL Intravenous Q12H   . fentaNYL infusion INTRAVENOUS 125 mcg/hr (08/26/16 0900)  . propofol (DIPRIVAN) infusion 10 mcg/kg/min (08/26/16 0700)   sodium chloride, sodium chloride, sodium chloride, sodium chloride, alteplase, docusate, fentaNYL, heparin, heparin, heparin, HYDROmorphone (DILAUDID) injection, lidocaine (PF), lidocaine-prilocaine, ondansetron **OR** ondansetron (ZOFRAN) IV, pentafluoroprop-tetrafluoroeth, zolpidem   Recent Labs Lab 08/24/16 1534 08/25/16 0425 08/25/16 1112 08/26/16 0425  NA 137 138  --  137  K 3.3* 2.9* 3.5 3.4*  CL 99* 99*  --  98*  CO2 23 24  --   24  GLUCOSE 104* 103*  --  91  BUN 57* 70*  --  52*  CREATININE 7.64* 8.94*  --  7.80*  CALCIUM 7.4* 7.5*  --  8.2*  PHOS 4.4 5.0*  --  6.2*    Recent Labs Lab 08/23/16 0301 08/24/16 0409 08/24/16 1534 08/26/16 0425  AST 1,001* 494*  --   --   ALT 284* 221*  --   --   ALKPHOS 71 72  --   --   BILITOT 0.9 0.6  --   --   PROT 5.4* 5.2*  --   --   ALBUMIN 2.7* 2.5* 2.6* 2.3*    Recent Labs Lab 08/23/16 0301 08/24/16 0409 08/25/16 0425 08/26/16 0425  WBC 9.3 12.1* 8.1 8.0  NEUTROABS 7.8*  --   --   --   HGB 11.2* 11.2* 10.2* 11.1*  HCT 32.6* 33.2* 29.6* 32.1*  MCV 83.8 83.8 81.8 83.6  PLT 98* 182 172 160   Results for ORVILL, COULTHARD (MRN [id number]) as of 08/26/2016 11:44  Ref. Range 08/23/2016 03:01 08/24/2016 04:09 08/26/2016 04:25  CK Total Latest Ref Range: 49 - 397 U/L >50,000 (H) 26,910 (H) 3,821 (H)    51 y.o. year-old with history of bipolar d/o, schizophrenia, chronic pain who presented to outside hospital after being found down at home by his family.  Taken to  ED where labs showed high BUN/ Cr. (BUN >100, creatinine 10). (Baseline creatinine 1.12 on 02/08/16).  Asked to see for renal failure. (Has history of rhabdo, AKI, bipolar d/o, suicide attempt (2011), syncope (2005) in the past). HD initiated 08/24/16  Assessment: 1. Acute kidney injury - ATN due to severe rhabdomyolysis.  s/p HD X2 . Volume status looks much better. 1. Weight down about 7 kg since admission.  2. Still minimal UOP but no emerging need for dialysis today. 3. Reassess in the AM - anticipate will need a treatment tomorrow. 2. Vol excess - much improved. Total 7 liters off with 2 HD sessions. 3. Hypokalemia - K 3.4. Would not replace. 4. AMS - due to uremia +/- drug overdose 5. Rhabdomyolysis, acute - CPK is falling 6. Bipolar d/o w suspected drug OD 7. Schizophrenia 8. Hx suicide attempt in the past 9. Chronic pain   Jamal Maes, MD [facility] [phone]  Pager 08/26/2016, 11:45 AM

## 2016-08-26 NOTE — Progress Notes (Signed)
PT Cancellation Note  Patient Details Name: [name] MRN: [id number] DOB: [birth year]   Cancelled Treatment:    Reason Eval/Treat Not Completed: Patient not medically ready, patient just started on weaning trial, hold PT at this time.   Duncan Dull 08/26/2016, 8:56 AM Alben Deeds, PT DPT  [phone]

## 2016-08-27 ENCOUNTER — Inpatient Hospital Stay (HOSPITAL_COMMUNITY): Payer: 59

## 2016-08-27 LAB — RENAL FUNCTION PANEL
ANION GAP: 18 — AB (ref 5–15)
Albumin: 2.1 g/dL — ABNORMAL LOW (ref 3.5–5.0)
BUN: 72 mg/dL — ABNORMAL HIGH (ref 6–20)
CALCIUM: 8.3 mg/dL — AB (ref 8.9–10.3)
CO2: 23 mmol/L (ref 22–32)
Chloride: 98 mmol/L — ABNORMAL LOW (ref 101–111)
Creatinine, Ser: 9.89 mg/dL — ABNORMAL HIGH (ref 0.61–1.24)
GFR calc non Af Amer: 5 mL/min — ABNORMAL LOW (ref >60)
GFR, EST AFRICAN AMERICAN: 6 mL/min — AB (ref >60)
GLUCOSE: 84 mg/dL (ref 65–99)
Phosphorus: 9.1 mg/dL — ABNORMAL HIGH (ref 2.5–4.6)
Potassium: 4.4 mmol/L (ref 3.5–5.1)
SODIUM: 139 mmol/L (ref 135–145)

## 2016-08-27 LAB — BLOOD GAS, ARTERIAL
Acid-base deficit: 3.6 mmol/L — ABNORMAL HIGH (ref 0.0–2.0)
BICARBONATE: 21.4 mmol/L (ref 20.0–28.0)
Drawn by: 419771
FIO2: 40
LHR: 14 {breaths}/min
MECHVT: 620 mL
O2 Saturation: 95.5 %
PATIENT TEMPERATURE: 98.6
PCO2 ART: 41.9 mmHg (ref 32.0–48.0)
PEEP/CPAP: 5 cmH2O
PO2 ART: 86.6 mmHg (ref 83.0–108.0)
pH, Arterial: 7.329 — ABNORMAL LOW (ref 7.350–7.450)

## 2016-08-27 LAB — CBC
HEMATOCRIT: 33.4 % — AB (ref 39.0–52.0)
Hemoglobin: 11.4 g/dL — ABNORMAL LOW (ref 13.0–17.0)
MCH: 28.7 pg (ref 26.0–34.0)
MCHC: 34.1 g/dL (ref 30.0–36.0)
MCV: 84.1 fL (ref 78.0–100.0)
Platelets: 205 10*3/uL (ref 150–400)
RBC: 3.97 MIL/uL — AB (ref 4.22–5.81)
RDW: 13.2 % (ref 11.5–15.5)
WBC: 8.6 10*3/uL (ref 4.0–10.5)

## 2016-08-27 LAB — CK: Total CK: 1412 U/L — ABNORMAL HIGH (ref 49–397)

## 2016-08-27 LAB — TRIGLYCERIDES: TRIGLYCERIDES: 307 mg/dL — AB (ref <150)

## 2016-08-27 LAB — MAGNESIUM: Magnesium: 2.4 mg/dL (ref 1.7–2.4)

## 2016-08-27 MED ORDER — HYDROCORTISONE 1 % EX CREA
TOPICAL_CREAM | Freq: Four times a day (QID) | CUTANEOUS | Status: DC | PRN
  Administered 2016-08-31: 1 via TOPICAL
  Filled 2016-08-27 (×2): qty 28

## 2016-08-27 MED ORDER — METOPROLOL TARTRATE 5 MG/5ML IV SOLN
5.0000 mg | Freq: Four times a day (QID) | INTRAVENOUS | Status: DC
  Administered 2016-08-27 – 2016-08-29 (×9): 5 mg via INTRAVENOUS
  Filled 2016-08-27 (×9): qty 5

## 2016-08-27 MED ORDER — ORAL CARE MOUTH RINSE
15.0000 mL | Freq: Two times a day (BID) | OROMUCOSAL | Status: DC
  Administered 2016-08-27 – 2016-09-08 (×19): 15 mL via OROMUCOSAL

## 2016-08-27 MED ORDER — HEPARIN SODIUM (PORCINE) 5000 UNIT/ML IJ SOLN
5000.0000 [IU] | Freq: Three times a day (TID) | INTRAMUSCULAR | Status: DC
  Administered 2016-08-27 – 2016-09-09 (×36): 5000 [IU] via SUBCUTANEOUS
  Filled 2016-08-27 (×36): qty 1

## 2016-08-27 MED ORDER — ALBUTEROL SULFATE (2.5 MG/3ML) 0.083% IN NEBU: 2.5000 mg | INHALATION_SOLUTION | RESPIRATORY_TRACT | Status: DC | PRN

## 2016-08-27 NOTE — Progress Notes (Signed)
Inpatient Rehabilitation  Per PT request patient was screened by Gunnar Fusi for appropriateness for an Inpatient Acute Rehab consult.  At this time we are recommending an Inpatient Rehab consult as well as OT evaluation orders.  Please order if you are agreeable.  Call if questions.  Carmelia Roller., CCC/SLP Admission [facility] [place]  Cell [phone]

## 2016-08-27 NOTE — Care Management Note (Addendum)
Case Management Note  Patient Details  Name: [name] MRN: [id number] Date of Birth: [birth year]  Subjective/Objective:                 Briefly, [name] is a 51 y/o man admitted in the early morning of 08/23/16 with rhabdomyolysis after an ingestion (wife thinks it was unintentional).  Over the course of his admission his renal function continued to decline and he developed worsening azotemia with altered mental status.  He then developed increasing hypoxia thought to be due to fluid overload and he was emergently intubated. Had HD 3/25 and 3/36 and is down 7L. Has minimal urine output. Hx mental illness with suicide atempt in past (per notes) although wife believes this was unintentional overdose. [facility] following   Action/Plan:   Expected Discharge Date:                  Expected Discharge Plan:     In-House Referral:     Discharge planning Services  CM Consult  Post Acute Care Choice:    Choice offered to:     DME Arranged:    DME Agency:     HH Arranged:    HH Agency:     Status of Service:  In process, will continue to follow  If discussed at Long Length of Stay Meetings, dates discussed:    Additional Comments:  Carles Collet, RN 08/27/2016, 9:26 AM

## 2016-08-27 NOTE — Consult Note (Signed)
   [facility] CM Inpatient Consult   08/27/2016  [name] 1966/04/21 [id number]     [facility] Care Management/Link to Wellness follow up for [employer] employees/dependents with [facility]. Noted Mr. Weckwerth is still in ICU. Will continue to follow and engage when appropriate.   Marthenia Rolling, MSN-Ed, RN,BSN [facility] Liaison [phone]

## 2016-08-27 NOTE — Procedures (Signed)
Extubation Procedure Note  Patient Details:   Name: [name] DOB: [birth year] MRN: [id number]   Airway Documentation:     Evaluation  O2 sats: stable throughout Complications: No apparent complications Patient did tolerate procedure well. Bilateral Breath Sounds: Clear   Yes  4l/min Incentive spirometer instructed   Revonda Standard 08/27/2016, 9:32 AM

## 2016-08-27 NOTE — Evaluation (Signed)
Physical Therapy Evaluation Patient Details Name: DONNA SILVERMAN MRN: [id number] DOB: December 07, 1965 Today's Date: 08/27/2016   History of Present Illness  Mr. Strohmeier is a 51 y/o man admitted in the early morning of 08/23/16 with rhabdomyolysis after an ingestion (wife thinks it was unintentional).  Over the course of his admission his renal function continued to decline and he developed worsening azotemia with altered mental status requiring dialysis.  He then developed increasing hypoxia thought to be due to fluid overload and he was emergently intubated. Extubated 08/27/16. PMH: chronic back pain, left tibial plateau fx, bipolar do, fibromyalgia, CHI x3  Clinical Impression  Pt admitted with above diagnosis. Pt currently with functional limitations due to the deficits listed below (see PT Problem List). Pt mobilized with mod A +2. Right gaze preference throughout session though able to cross midline to left when cued (does not maintain).  Pt will benefit from skilled PT to increase their independence and safety with mobility to allow discharge to the venue listed below.       Follow Up Recommendations CIR;Supervision/Assistance - 24 hour    Equipment Recommendations  None recommended by PT    Recommendations for Other Services OT consult;Rehab consult     Precautions / Restrictions Precautions Precautions: Fall Precaution Comments: has fallen since admission Restrictions Weight Bearing Restrictions: No      Mobility  Bed Mobility Overal bed mobility: Needs Assistance Bed Mobility: Supine to Sit     Supine to sit: +2 for physical assistance;Mod assist     General bed mobility comments: hand over hand guidance to initiate mobility. Mod A to roll left and legs off bed. Mod A for trunk elevation  Transfers Overall transfer level: Needs assistance Equipment used: 2 person hand held assist Transfers: Sit to/from [facility] Sit to Stand: Mod assist;+2 physical  assistance Stand pivot transfers: Mod assist;+2 physical assistance       General transfer comment: mod A +2 to steady and small boost for power up. Pt able to take pivot steps to recliner and sat for short period. Did not realize his femoral cath was temporary so remained sitting 10 mins and was then assisted back to bed by nursing.   Ambulation/Gait                Stairs            Wheelchair Mobility    Modified Rankin (Stroke Patients Only)       Balance Overall balance assessment: Needs assistance Sitting-balance support: Single extremity supported Sitting balance-Leahy Scale: Fair     Standing balance support: Bilateral upper extremity supported Standing balance-Leahy Scale: Poor                               Pertinent Vitals/Pain Pain Assessment: Faces Faces Pain Scale: Hurts little more Pain Location: left knee Pain Descriptors / Indicators: Aching Pain Intervention(s): Limited activity within patient's tolerance;Monitored during session    [place] expects to be discharged to:: Private residence Living Arrangements: Alone Available Help at Discharge: Personal care attendant Type of Home: Apartment Home Access: Level entry     Home Layout: One level Home Equipment: Walker - 2 wheels;Crutches;Cane - single point Additional Comments: ex-wife and daughter help intermittently. PCA 3 days/ week for 1.5 hrs, helps with cooking and cleaning, ADL's as needed    Prior Function Level of Independence: Needs assistance   Gait / Transfers Assistance Needed:  ambulates with cane since tibial plateau fx few years ago  ADL's / Homemaking Assistance Needed: assist for home mgmt        Hand Dominance        Extremity/Trunk Assessment   Upper Extremity Assessment Upper Extremity Assessment: Defer to OT evaluation    Lower Extremity Assessment Lower Extremity Assessment: RLE deficits/detail;LLE deficits/detail RLE  Deficits / Details: knee ext 3+/5, flex 3+/5 RLE Coordination: decreased gross motor LLE Deficits / Details: knee ext 3/5, knee flex 3/5 LLE Coordination: decreased gross motor    Cervical / Trunk Assessment Cervical / Trunk Assessment: Normal  Communication   Communication: Expressive difficulties (speech mildly slurred and low voice)  Cognition Arousal/Alertness: Lethargic Behavior During Therapy: Flat affect Overall Cognitive Status: Impaired/Different from baseline Area of Impairment: Problem solving                             Problem Solving: Difficulty sequencing;Requires verbal cues General Comments: pt with right gaze preference throughout session, facial asymmetry noted, followed commands consistently, had difficulty sequencing mobility tasks       General Comments General comments (skin integrity, edema, etc.): O2 sats 93% on 2L O2, HR in 90's    Exercises General Exercises - Lower Extremity Ankle Circles/Pumps: AROM;10 reps;Seated   Assessment/Plan    PT Assessment Patient needs continued PT services  PT Problem List Decreased strength;Decreased activity tolerance;Decreased balance;Decreased mobility;Decreased knowledge of use of DME;Decreased knowledge of precautions;Decreased safety awareness;Decreased cognition;Decreased coordination       PT Treatment Interventions DME instruction;Gait training;Functional mobility training;Therapeutic activities;Therapeutic exercise;Balance training;Patient/family education;Cognitive remediation    PT Goals (Current goals can be found in the Care Plan section)  Acute Rehab PT Goals Patient Stated Goal: return home PT Goal Formulation: With patient Time For Goal Achievement: 09/10/16 Potential to Achieve Goals: Good    Frequency Min 3X/week   Barriers to discharge Decreased caregiver support lives alone    Co-evaluation               End of Session Equipment Utilized During Treatment: Gait  belt;Oxygen Activity Tolerance: Patient tolerated treatment well Patient left: in chair;with call bell/phone within reach;with nursing/sitter in room Nurse Communication: Mobility status PT Visit Diagnosis: Unsteadiness on feet (R26.81);Muscle weakness (generalized) (M62.81)    Time: 8592-9244 PT Time Calculation (min) (ACUTE ONLY): 25 min   Charges:   PT Evaluation $PT Eval Moderate Complexity: 1 Procedure PT Treatments $Therapeutic Activity: 8-22 mins   PT G Codes:        Leighton Roach, PT  Acute Rehab Services  [facility] 08/27/2016, 2:04 PM

## 2016-08-27 NOTE — Progress Notes (Signed)
[facility] Progress Note   Subjective:  s/p HD X 2 (3/25, 3/26) Extubated now Making some urine, CPK coming down, creatinine not improving Wants something to drink  Vitals:   08/27/16 0735 08/27/16 0752 08/27/16 0753 08/27/16 1111  BP:   124/77   Pulse:   (!) 102   Resp:   14   Temp: 99.3 F (37.4 C)   97.9 F (36.6 C)  TempSrc: Oral   Oral  SpO2:  94% 94%   Weight:      Height:        Exam: VS as noted above Maculopapular rash on arms, legs looks unchanged (he says acne??) Extubated and up in the chair (with a femoral cath in!) Sclera anicteric No jvd or bruits Chest anteriorly clear Abd soft and not tender Foley in place, small amt urine lighter yellow No edema of LE's R fem HD cath dsg intact (3/24)    Inpatient medications: . aspirin EC  81 mg Oral Daily  . chlorhexidine gluconate (MEDLINE KIT)  15 mL Mouth Rinse BID  . Chlorhexidine Gluconate Cloth  6 each Topical Daily  . fentaNYL (SUBLIMAZE) injection  50 mcg Intravenous Once  . levothyroxine  37.5 mcg Intravenous Daily  . mouth rinse  15 mL Mouth Rinse 10 times per day  . metoprolol  2.5 mg Intravenous Q6H  . pantoprazole (PROTONIX) IV  40 mg Intravenous Q24H  . sodium chloride flush  10-40 mL Intracatheter Q12H  . sodium chloride flush  3 mL Intravenous Q12H   . fentaNYL infusion INTRAVENOUS 150 mcg/hr (08/27/16 0644)  . propofol (DIPRIVAN) infusion 15 mcg/kg/min (08/27/16 0644)   sodium chloride, sodium chloride, sodium chloride, sodium chloride, albuterol, alteplase, docusate, fentaNYL, heparin, heparin, heparin, HYDROmorphone (DILAUDID) injection, lidocaine (PF), lidocaine-prilocaine, ondansetron **OR** ondansetron (ZOFRAN) IV, pentafluoroprop-tetrafluoroeth, zolpidem   Recent Labs Lab 08/25/16 0425 08/25/16 1112 08/26/16 0425 08/27/16 0330  NA 138  --  137 139  K 2.9* 3.5 3.4* 4.4  CL 99*  --  98* 98*  CO2 24  --  24 23  GLUCOSE 103*  --  91 84  BUN 70*  --  52* 72*   CREATININE 8.94*  --  7.80* 9.89*  CALCIUM 7.5*  --  8.2* 8.3*  PHOS 5.0*  --  6.2* 9.1*    Recent Labs Lab 08/23/16 0301 08/24/16 0409 08/24/16 1534 08/26/16 0425 08/27/16 0330  AST 1,001* 494*  --   --   --   ALT 284* 221*  --   --   --   ALKPHOS 71 72  --   --   --   BILITOT 0.9 0.6  --   --   --   PROT 5.4* 5.2*  --   --   --   ALBUMIN 2.7* 2.5* 2.6* 2.3* 2.1*    Recent Labs Lab 08/23/16 0301  08/25/16 0425 08/26/16 0425 08/27/16 0330  WBC 9.3  < > 8.1 8.0 8.6  NEUTROABS 7.8*  --   --   --   --   HGB 11.2*  < > 10.2* 11.1* 11.4*  HCT 32.6*  < > 29.6* 32.1* 33.4*  MCV 83.8  < > 81.8 83.6 84.1  PLT 98*  < > 172 160 205  < > = values in this interval not displayed. Results for ORION, MOLE (MRN [id number]) as of 08/26/2016 11:44  Ref. Range 08/23/2016 03:01 08/24/2016 04:09 08/26/2016 04:25  CK Total Latest Ref Range: 49 - 397 U/L >50,000 (  H) 26,910 (H) 3,821 (H)    51 y.o. year-old with history of bipolar d/o, schizophrenia, chronic pain who presented to outside hospital after being found down at home by his family.  Taken to ED where labs showed high BUN/ Cr. (BUN >100, creatinine 10). (Baseline creatinine 1.12 on 02/08/16).  Asked to see for renal failure. (Has history of rhabdo, AKI, bipolar d/o, suicide attempt (2011), syncope (2005) in the past). HD initiated 08/24/16  Assessment: 1. Acute kidney injury - ATN due to severe rhabdomyolysis.  s/p HD X2 .  1. Volume status looks much better.(Weight down about 7 kg since admission) and looks euvolemic to me.  2. Still minimal UOP/creatinine still rising 3. Dialysis today - keep volume even (HD in the ICU as separate) 4. Will need to switch out fem cath for IJ catheter so can get pt OOB, etc - will ask CCM to place 5. Add phos binder when taking po  2. Rhabdomyolysis, acute - CPK is falling 3. Bipolar d/o w suspected drug OD (wife thinks was unintentional) 4. Schizophrenia 5. Hx suicide attempt in the past 6. Chronic  pain 7. Hypertension/tachycardia  1. Still NPO.  2. On metoprolol 2.5 IV Q6H 3. Increase to 5 mg Q6H    Jamal Maes, MD [facility] [phone] Pager 08/27/2016, 11:25 AM

## 2016-08-27 NOTE — Progress Notes (Signed)
PULMONARY / CRITICAL CARE MEDICINE   Name: [name] MRN: [id number] DOB: 1965-11-22    ADMISSION DATE:  08/23/2016 CONSULTATION DATE:  08/24/16  REFERRING MD:  Dr. Eulas Post  CHIEF COMPLAINT:  Respiratory failure  HISTORY OF PRESENT ILLNESS:   Please see H&P for full admission details.  Briefly, [name] is a 51 y/o man admitted in the early morning of 08/23/16 with rhabdomyolysis after an ingestion (wife thinks it was unintentional).  Over the course of his admission his renal function continued to decline and he developed worsening azotemia with altered mental status requiring dialysis.  He then developed increasing hypoxia thought to be due to fluid overload and he was emergently intubated.  SUBJECTIVE:  Agitated overnight but weaning well this AM  VITAL SIGNS: BP 124/77   Pulse (!) 102   Temp 99.3 F (37.4 C) (Oral)   Resp 14   Ht 6' (1.829 m)   Wt 112.4 kg (247 lb 12.8 oz)   SpO2 94%   BMI 33.61 kg/m   HEMODYNAMICS:    VENTILATOR SETTINGS: Vent Mode: PSV;CPAP FiO2 (%):  [40 %] 40 % Set Rate:  [14 bmp] 14 bmp Vt Set:  [620 mL] 620 mL PEEP:  [5 cmH20] 5 cmH20 Pressure Support:  [5 cmH20] 5 cmH20 Plateau Pressure:  [12 cmH20-16 cmH20] 16 cmH20  INTAKE / OUTPUT: I/O last 3 completed shifts: In: 927.7 [I.V.:927.7] Out: 325 [Urine:325]  PHYSICAL EXAMINATION: General:  Arousable, moving all ext to command but agitated this AM Neuro:  Moving all ext to command, slow to respond but unsure of baseline HEENT:  [place]/AT, PERRL, ETT in place Cardiovascular:   RRR, Nl S1/S2, -M/R/G. Lungs:  Coarse BS diffusely Abdomen:  Soft, NT, ND, no palpable HSM Musculoskeletal:  No peripheral edema, no joint abnormalities Skin:  No rashes or other lesions  LABS:  BMET  Recent Labs Lab 08/25/16 0425 08/25/16 1112 08/26/16 0425 08/27/16 0330  NA 138  --  137 139  K 2.9* 3.5 3.4* 4.4  CL 99*  --  98* 98*  CO2 24  --  24 23  BUN 70*  --  52* 72*  CREATININE 8.94*  --   7.80* 9.89*  GLUCOSE 103*  --  91 84   Electrolytes  Recent Labs Lab 08/25/16 0425 08/26/16 0425 08/27/16 0330  CALCIUM 7.5* 8.2* 8.3*  MG 2.1 2.3 2.4  PHOS 5.0* 6.2* 9.1*   CBC  Recent Labs Lab 08/25/16 0425 08/26/16 0425 08/27/16 0330  WBC 8.1 8.0 8.6  HGB 10.2* 11.1* 11.4*  HCT 29.6* 32.1* 33.4*  PLT 172 160 205   Coag's No results for input(s): APTT, INR in the last 168 hours.  Sepsis Markers No results for input(s): LATICACIDVEN, PROCALCITON, O2SATVEN in the last 168 hours.  ABG  Recent Labs Lab 08/24/16 0553 08/25/16 0359 08/27/16 0509  PHART 7.318* 7.509* 7.329*  PCO2ART 43.7 29.3* 41.9  PO2ART 335.0* 66.0* 86.6   Liver Enzymes   Recent Labs Lab 08/23/16 0301 08/24/16 0409 08/24/16 1534 08/26/16 0425 08/27/16 0330  AST 1,001* 494*  --   --   --   ALT 284* 221*  --   --   --   ALKPHOS 71 72  --   --   --   BILITOT 0.9 0.6  --   --   --   ALBUMIN 2.7* 2.5* 2.6* 2.3* 2.1*   Cardiac Enzymes No results for input(s): TROPONINI, PROBNP in the last 168 hours.  Glucose  Recent  Labs Lab 08/25/16 0759 08/26/16 1528  GLUCAP 98 85   Imaging Dg Chest Port 1 View  Result Date: 08/27/2016 CLINICAL DATA:  Respiratory failure, intubated patient. EXAM: PORTABLE CHEST 1 VIEW COMPARISON:  Portable chest x-ray of March 11/19/2016 FINDINGS: The lungs are less well inflated today. Density at both lung bases and is increased compatible with atelectasis. There may be a small left pleural effusion. The heart is normal in size. The pulmonary vascularity is prominent centrally. The endotracheal tube tip lies 2.1 cm above the carina. The esophagogastric tube tip lies below the GE junction. The right internal jugular venous catheter tip projects over the proximal SVC. A nerve stimulator electrode projects over the mid thoracic spine. IMPRESSION: Mild hypoinflation today. Bibasilar atelectasis and probable small left pleural effusion are more conspicuous. The support  tubes are in reasonable position. Electronically Signed   By: David  Martinique M.D.   On: 08/27/2016 07:49   Dg Abd Portable 1v  Result Date: 08/26/2016 CLINICAL DATA:  51 year old male post orogastric tube placement. Initial encounter. EXAM: PORTABLE ABDOMEN - 1 VIEW COMPARISON:  None. FINDINGS: Nasogastric tube tip gastric fundus level however the side hole is in the region of the distal esophagus. This can be advanced by 5 cm. Spine stimulating device in place. Left base atelectasis. Nonspecific bowel gas pattern. IMPRESSION: Nasogastric tube tip gastric fundus level however the side hole is in the region of the distal esophagus. This can be advanced by 5 cm. Electronically Signed   By: Genia Del M.D.   On: 08/26/2016 13:10   STUDIES:  Abd [place] 3/24 - no acute process  CULTURES: None  ANTIBIOTICS: None  SIGNIFICANT EVENTS: Intubated 08/24/16  LINES/TUBES: Dialysis cath 3/24 - Right femoral Right IJ CVC - 3/24  DISCUSSION: 51 y/o man with rhabdo 2/2 overdose now with azotemia resulting in altered mental status and fluid overload with hypoxic and hypercarbic respiratory failure  ASSESSMENT / PLAN:  PULMONARY A: Respiratory failure P:   Extubate today and monitor for airway protection Need to speak with wife to determine if trach would be a consideration in this patient Titrate O2 for sat of 88-92%. Will need to determine airway protection status post extubation.  RENAL A:   AKI - due to rhabdomyolysis P:   Dialysis per renal likely today Replace electrolytes as indicated BMET in AM [facility] IVF  INFECTIOUS A:   At risk for aspiration pna P:   Monitor for fever Follow WBC  ENDOCRINE A:   hyperglycemia   P:   SSI CBGs  NEUROLOGIC A:   Sedated Chronic pain P:   RASS goal: 0 to 1 D/C all sedation Monitor closely for airway protection  FAMILY  - Updates: No family bedside but need to speak with wife for plan of care if unable to maintain airway  -  Inter-disciplinary family meet or Palliative Care meeting due by:  09/01/16  The patient is critically ill with multiple organ systems failure and requires high complexity decision making for assessment and support, frequent evaluation and titration of therapies, application of advanced monitoring technologies and extensive interpretation of multiple databases.   Critical Care Time devoted to patient care services described in this note is  35  Minutes. This time reflects time of care of this signee Dr Jennet Maduro. This critical care time does not reflect procedure time, or teaching time or supervisory time of PA/NP/Med student/Med Resident etc but could involve care discussion time.  Rush Farmer, M.D. [facility] Pulmonary/Critical Care  Medicine. Pager: [phone]. After hours pager: [phone].  08/27/2016, 11:10 AM

## 2016-08-28 ENCOUNTER — Inpatient Hospital Stay (HOSPITAL_COMMUNITY): Payer: 59

## 2016-08-28 DIAGNOSIS — F1190 Opioid use, unspecified, uncomplicated: Secondary | ICD-10-CM

## 2016-08-28 DIAGNOSIS — M6282 Rhabdomyolysis: Secondary | ICD-10-CM

## 2016-08-28 DIAGNOSIS — Z79899 Other long term (current) drug therapy: Secondary | ICD-10-CM

## 2016-08-28 DIAGNOSIS — Z87891 Personal history of nicotine dependence: Secondary | ICD-10-CM

## 2016-08-28 DIAGNOSIS — F259 Schizoaffective disorder, unspecified: Secondary | ICD-10-CM

## 2016-08-28 DIAGNOSIS — F1490 Cocaine use, unspecified, uncomplicated: Secondary | ICD-10-CM

## 2016-08-28 LAB — MAGNESIUM: Magnesium: 2.3 mg/dL (ref 1.7–2.4)

## 2016-08-28 LAB — CBC
HCT: 31.3 % — ABNORMAL LOW (ref 39.0–52.0)
HEMOGLOBIN: 10.7 g/dL — AB (ref 13.0–17.0)
MCH: 28.5 pg (ref 26.0–34.0)
MCHC: 34.2 g/dL (ref 30.0–36.0)
MCV: 83.5 fL (ref 78.0–100.0)
Platelets: 201 10*3/uL (ref 150–400)
RBC: 3.75 MIL/uL — AB (ref 4.22–5.81)
RDW: 12.9 % (ref 11.5–15.5)
WBC: 8.7 10*3/uL (ref 4.0–10.5)

## 2016-08-28 LAB — RENAL FUNCTION PANEL
ANION GAP: 16 — AB (ref 5–15)
Albumin: 2 g/dL — ABNORMAL LOW (ref 3.5–5.0)
BUN: 56 mg/dL — ABNORMAL HIGH (ref 6–20)
CHLORIDE: 98 mmol/L — AB (ref 101–111)
CO2: 24 mmol/L (ref 22–32)
Calcium: 8.3 mg/dL — ABNORMAL LOW (ref 8.9–10.3)
Creatinine, Ser: 7.57 mg/dL — ABNORMAL HIGH (ref 0.61–1.24)
GFR, EST AFRICAN AMERICAN: 9 mL/min — AB (ref >60)
GFR, EST NON AFRICAN AMERICAN: 7 mL/min — AB (ref >60)
Glucose, Bld: 73 mg/dL (ref 65–99)
POTASSIUM: 3.8 mmol/L (ref 3.5–5.1)
Phosphorus: 7.4 mg/dL — ABNORMAL HIGH (ref 2.5–4.6)
Sodium: 138 mmol/L (ref 135–145)

## 2016-08-28 LAB — TROPONIN I: Troponin I: <0.03 ng/mL (ref <0.03)

## 2016-08-28 LAB — CK: Total CK: 795 U/L — ABNORMAL HIGH (ref 49–397)

## 2016-08-28 MED ORDER — BOOST / RESOURCE BREEZE PO LIQD
1.0000 | Freq: Three times a day (TID) | ORAL | Status: DC
  Administered 2016-08-28 – 2016-09-01 (×5): 1 via ORAL

## 2016-08-28 MED ORDER — NITROGLYCERIN 0.4 MG SL SUBL: 0.4000 mg | SUBLINGUAL_TABLET | SUBLINGUAL | Status: DC | PRN

## 2016-08-28 MED ORDER — LEVOTHYROXINE SODIUM 75 MCG PO TABS
75.0000 ug | ORAL_TABLET | Freq: Every day | ORAL | Status: DC
  Administered 2016-08-29 – 2016-09-09 (×11): 75 ug via ORAL
  Filled 2016-08-28 (×12): qty 1

## 2016-08-28 MED ORDER — NITROGLYCERIN 2 % TD OINT
0.5000 [in_us] | TOPICAL_OINTMENT | Freq: Three times a day (TID) | TRANSDERMAL | Status: AC
  Filled 2016-08-28: qty 30

## 2016-08-28 MED ORDER — PANTOPRAZOLE SODIUM 40 MG PO TBEC
40.0000 mg | DELAYED_RELEASE_TABLET | Freq: Every day | ORAL | Status: DC
  Administered 2016-08-29 – 2016-09-09 (×11): 40 mg via ORAL
  Filled 2016-08-28 (×12): qty 1

## 2016-08-28 NOTE — Evaluation (Signed)
Clinical/Bedside Swallow Evaluation Patient Details  Name: [name] MRN: [id number] Date of Birth: Apr 18, 1966  Today's Date: 08/28/2016 Time: SLP Start Time (ACUTE ONLY): 1412 SLP Stop Time (ACUTE ONLY): 1432 SLP Time Calculation (min) (ACUTE ONLY): 20 min  Past Medical History:  Past Medical History:  Diagnosis Date  . Accelerated hypertension 08/23/2016  . Anxiety   . Arthritis   . Bipolar affective disorder ([place])   . Chronic back pain   . Chronic kidney disease    AKF - due to Rhabdomyolysis  . DDD (degenerative disc disease)   . Depression   . DJD (degenerative joint disease)   . Elevated liver function tests   . Fibromyalgia   . GERD (gastroesophageal reflux disease)   . Head injury   . Head injury, closed, with concussion    x 3  . Hyperlipidemia   . Hypothyroidism   . IBS (irritable bowel syndrome)   . Inguinal hernia    left  . Insomnia   . Myofascial pain syndrome   . Osteopenia   . Shortness of breath dyspnea    with exertion  . Tibial plateau fracture, left    Past Surgical History:  Past Surgical History:  Procedure Laterality Date  . CARDIAC CATHETERIZATION N/A 08/31/2015   Procedure: Left Heart Cath and Coronary Angiography;  Surgeon: Leonie Man, MD;  Location: [facility];  Service: Cardiovascular;  Laterality: N/A;  . Carotid Dopplers Bilateral 02/20/2015   [facility]: Mild, less than 39% left and right internal carotid artery stenosis. No significant plaque burden  . CERVICAL DISC SURGERY     C5-7  . COLONOSCOPY    . COLONOSCOPY    . ESOPHAGOGASTRODUODENOSCOPY    . FASCIOTOMY Left 10/20/2013   Procedure: LEFT leg ANTERIOR COMPARTMENT FACSCIOTOMY;  Surgeon: Rozanna Box, MD;  Location: [facility];  Service: Orthopedics;  Laterality: Left;  . INGUINAL HERNIA REPAIR Left   . Nuclear Stress Test  03/06/2015   [facility]: Normal EKG. Low normal EF (49%) normal regional wall motion. No evidence of ischemia or  infarction.  . ORIF TIBIA PLATEAU Left 10/20/2013   Procedure: OPEN REDUCTION INTERNAL FIXATION (ORIF) LEFT TIBIAL PLATEAU;  Surgeon: Rozanna Box, MD;  Location: [facility];  Service: Orthopedics;  Laterality: Left;  . SEPTOPLASTY    . SPINAL CORD STIMULATOR BATTERY EXCHANGE N/A 02/08/2016   Procedure: Lumbar spinal cord stimulator implantable pulse generator replacement;  Surgeon: Clydell Hakim, MD;  Location: [facility];  Service: Neurosurgery;  Laterality: N/A;  . SPINAL CORD STIMULATOR IMPLANT    . TRANSTHORACIC ECHOCARDIOGRAM  02/20/2015   [facility]: Mild concentric LVH. EF 55-60%. Normal regional wall motion. Mild to moderate TR with no significant pulmonary hypertension.   HPI:  Pt is a 51 y.o. male admitted in the early morning of 08/23/16 with rhabdomyolysis after an ingestion (wife thinks it was unintentional). Over the course of his admission his renal function continued to decline and he developed worsening azotemia with altered mental status requiring dialysis. He then developed increasing hypoxia thought to be due to fluid overload and he was emergently intubated. Intubated 3/25-3/28. Extubated 3/28. PMH: chronic kidney disease, hyperlipidemia, GERD, head injury, SOB, bipolar disorder, depression, and fibromyalgia.   Assessment / Plan / Recommendation Clinical Impression  Pt extubated more than 24 hours ago after multiple intubations. Pt was given ice chips, thin liquids by cup and straw, puree (self-fed) and cracker. Pt had one throat clear after multiple  sequencial swallows. He had no coughing observed during evaluation, but after presentation of all consistencies, pt began belching. He reported having nausea earlier today and was given a dose of protonix 2 hours prior. Pt reports a history of GERD treated with Nexium. Recommend patient begin mechanical diet with thin liquids. No straws to reduce amount of gas swallowed. Also recommend patient practice aspiration precautions  of sitting up while eating, small bites and sips and alternating liquids and solids. Pt will be followed by speech therapy to monitor diet tolerance and compliance with modifications/aspiraiton precautions. SLP Visit Diagnosis: Dysphagia, oropharyngeal phase (R13.12)    Aspiration Risk  Moderate aspiration risk    Diet Recommendation Dysphagia 3 (Mech soft);Thin liquid   Liquid Administration via: Cup Medication Administration: Whole meds with liquid Supervision: Full supervision/cueing for compensatory strategies Compensations: Minimize environmental distractions;Slow rate;Small sips/bites (alternate liquids/solids) Postural Changes: Seated upright at 90 degrees;Remain upright for at least 30 minutes after po intake    Other  Recommendations Oral Care Recommendations: Oral care BID   Follow up Recommendations        Frequency and Duration min 1 x/week  1 week       Prognosis Prognosis for Safe Diet Advancement: Good      Swallow Study   General Date of Onset: 08/23/16 HPI: Pt is a 51 y.o. male admitted in the early morning of 08/23/16 with rhabdomyolysis after an ingestion (wife thinks it was unintentional). Over the course of his admission his renal function continued to decline and he developed worsening azotemia with altered mental status requiring dialysis. He then developed increasing hypoxia thought to be due to fluid overload and he was emergently intubated. Intubated 3/25-3/28. Extubated 3/28. PMH: chronic kidney disease, hyperlipidemia, GERD, head injury, SOB, bipolar disorder, depression, and fibromyalgia. Type of Study: Bedside Swallow Evaluation Previous Swallow Assessment: none Diet Prior to this Study: Dysphagia 3 (soft);Thin liquids Temperature Spikes Noted: No Respiratory Status: Nasal cannula History of Recent Intubation: Yes Length of Intubations (days): 3 days Date extubated: 08/27/16 Behavior/Cognition: Alert;Cooperative;Pleasant mood Oral Cavity  Assessment: Within Functional Limits Oral Care Completed by SLP: No Oral Cavity - Dentition: Dentures, not available;Edentulous Vision: Functional for self-feeding Self-Feeding Abilities: Able to feed self Patient Positioning: Upright in bed Baseline Vocal Quality: Hoarse;Low vocal intensity Volitional Cough: Weak Volitional Swallow: Able to elicit    Oral/Motor/Sensory Function Overall Oral Motor/Sensory Function: Within functional limits   Ice Chips Ice chips: Within functional limits Presentation: Cup;Spoon   Thin Liquid Thin Liquid: Within functional limits Presentation: Cup;Straw    Nectar Thick Nectar Thick Liquid: Not tested   Honey Thick Honey Thick Liquid: Not tested   Puree Puree: Within functional limits   Solid   GO   Solid: Within functional limits Presentation: [place], MA, CCC-SLP 08/28/2016 2:41 PM

## 2016-08-28 NOTE — Progress Notes (Signed)
eLink Physician-Brief Progress Note Patient Name: [name] DOB: 1965-06-18 MRN: [id number]   Date of Service  08/28/2016  HPI/Events of Note  Patient c/o chest pain. Cardiac Cath 08/31/2015  Reveals angiographically minimal CAD. Mid LAD to Dist LAD lesion, 35% stenosed. Prox Cx lesion, 40% stenosed. The left ventricular systolic function is normal. EKG - NSR. Rate = 90. No acute changes. BP = 175/91.   eICU Interventions  Will order: 1. NTG 0.4 mg SL PRN. 2. Nitropaste 0.5 inch to chest wall now and Q 8 hours.  3. Cycle Troponin.      Intervention Category Intermediate Interventions: Pain - evaluation and management  [name] Cornelia Copa 08/28/2016, 8:18 PM

## 2016-08-28 NOTE — Progress Notes (Signed)
Attempted to call report 4E; RN unavailable; will try again at a later time.  Ruben Reason

## 2016-08-28 NOTE — Progress Notes (Signed)
Pt to transfer to 4E23; pt wife made aware; 2nd attempt to call report unsuccessful; will try again at a later time.  Ruben Reason

## 2016-08-28 NOTE — Progress Notes (Signed)
Nutrition Follow-up  DOCUMENTATION CODES:   Obesity unspecified  INTERVENTION:   Boost Breeze po TID, each supplement provides 250 kcal and 9 grams of protein  NUTRITION DIAGNOSIS:   Inadequate oral intake related to  (missing teeth) as evidenced by  (per pt report). Ongoing.   GOAL:   Patient will meet greater than or equal to 90% of their needs Progressing.   MONITOR:   Supplement acceptance, PO intake  ASSESSMENT:   51 y/o man with rhabdo 2/2 overdose now with azotemia resulting in altered mental status and fluid overload with hypoxic and hypercarbic respiratory failure  Pt extubated, diet just advanced Per pt he eats one meal per day due to finances. He has no teeth and does not know where his dentures are. His family has been by and do not seem to know either. He lives alone but mentions a wife and daughter.  Reports no recent weight loss.   Acute kidney injury - ATN due to severe rhabdomyolysis.  s/p HD X 3 (3/25, 3/26, 3/28). Still oliguric. Plan for HD tomorrow, phos binder added PO4 7.4  Diet Order:  DIET DYS 3 Room service appropriate? Yes; Fluid consistency: Thin  Skin:  Reviewed, no issues  Last BM:  unknown  Height:   Ht Readings from Last 1 Encounters:  08/23/16 6' (1.829 m)    Weight:   Wt Readings from Last 1 Encounters:  08/28/16 244 lb 4.3 oz (110.8 kg)    Ideal Body Weight:  80.9 kg  BMI:  Body mass index is 33.13 kg/m.  Estimated Nutritional Needs:   Kcal:  2100-2300  Protein:  100-120 grams  Fluid:  > 2 L/day  EDUCATION NEEDS:   No education needs identified at this time  [place], [place], [place] Pager [phone] After Hours Pager

## 2016-08-28 NOTE — Consult Note (Signed)
[name] Face-to-Face Psychiatry Consult   Reason for Consult:  AMS and intentional drug overdose Referring Physician:  Dr. Joya Gaskins Patient Identification: SIRIS HOOS MRN:  [id number] Principal Diagnosis: AKI (acute kidney injury) [facility]) Diagnosis:   Patient Active Problem List   Diagnosis Date Noted  . Rhabdomyolysis [M62.82] 08/23/2016  . AKI (acute kidney injury) ([place]) [N17.9] 08/23/2016  . Transaminitis [R74.0] 08/23/2016  . Drug overdose, multiple drugs [T50.901A] 08/23/2016  . Tachycardia [R00.0] 08/23/2016  . Accelerated hypertension [I10] 08/23/2016  . Bipolar disorder, curr episode mixed, severe, with psychotic features ([place]) [F31.64] 12/26/2015  . Cocaine use disorder, mild, abuse [F14.10] 12/26/2015  . History of rhabdomyolysis [Z87.39] 12/26/2015  . Liver injury [S36.119A] 12/26/2015  . Hx of renal failure [Z87.448] 12/26/2015  . Angina, class III ([place]) [I20.9] 08/18/2015  . Dyslipidemia, goal LDL below 70 [E78.5] 08/18/2015  . Family history of premature coronary artery disease [Z82.49] 08/18/2015  . Tobacco use disorder, continuous [F17.209] 08/18/2015  . Tibial plateau fracture [S82.143A] 10/02/2013  . Multiple falls [R29.6] 10/02/2013  . Alteration in self-care ability [R53.81] 10/02/2013  . 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 Fracture of tibia with fibula, left, closed [S82.202A, S82.402A] 10/02/2013  . Chronic back pain [M54.9, G89.29] 10/02/2013  . 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 History of suicidal ideation [Z86.59] 10/02/2013  . Postural imbalance with levoscoliosis, h/o [R29.3] 10/02/2013  . Chest pain [R07.9] 12/25/2012  . IRRITABLE BOWEL SYNDROME [K58.9] 08/09/2008  . HYPERLIPIDEMIA [E78.5] 10/28/2006  . DISORDER, BIPOLAR NOS [F31.9] 10/28/2006  . ANXIETY [F41.1] 10/28/2006  . DEPRESSION [F32.9] 10/28/2006  . PAIN, CHRONIC NEC [G89.29] 10/28/2006  . GERD [K21.9] 10/28/2006    Total Time spent with  patient: 1 hour  Subjective:   [name] is a 51 y.o. male patient admitted with unintentional drug overdose  HPI:  [name] is a 51 years old male seen, chart reviewed for this face-to-face psychiatric consultation and evaluation of unintentional drug overdose and questionable suicidal ideation. Patient reported he has been diagnosed with schizoaffective disorder and chronic pain syndrome along with hypothyroidism. Patient has been taking 13 different medication every day and that he has difficulty to remember names of the medication. Patient reported he has ongoing mild symptoms of depression but denies suicidal ideation, intention or plans. Patient also reported no homicidal ideation, intention or plans. Patient has no evidence of auditory/visual hallucinations, delusions or paranoia. Patient reported he may have taken excessive dose of Seroquel or opioids unintentionally and does not remember anything after that. Patient reportedly has similar episodes at least twice in the past and apparently rhabdomyolysis causing acute kidney injury. Patient reportedly receiving outpatient medication management from neuropsychiatry and willing to follow up with them once discharged from the hospital. UDS positive for benzos and oxydocodone.  Medical history: This is a 51 years old male with a history of Bipolar disorder, schizophrenia, chronic pain from his neck and back, and hypothyroidism who has a history of unintentional drug overdose and two prior episodes of rhadomyolysis causing acute kidney injury.  The patient presented to the outside hospital yesterday after being found down at home by his wife with altered mental status and generalized weakness.  No nausea or vomiting noted.  No bowel or bladder incontinence noted.  No convulsions noted.  He was taken to the ED at [facility] for emergent evaluation.  The patient has a spine stimulator for management of his chronic pain.  His wife also reports  recent changes to  his pain medication (increase in dose), and she suspects that increased use of the pain medication precipitated confusion, which contributed to him mismanaging his other medications.  However, she reports refills of his medications approximately one week ago and his Zyprexa and vistaril bottles are now empty, and his gabapentin and buspar bottles are "half full".  The patient's wife does not think that he was trying to hurt himself.  ED Course (At [facility]): EKG showed a sinus tachycardia.  Chest xray negative for acute process.  CPK level is 119,000. BUN 108, Creatinine 10.4.  Bicarb level 16.  Calcium level 7.3.  WBC count 13.  Hgb 13.  Platelet 222.  AST 1451, ALT 407. Normal bilirubin and alk phos.  Negative troponin.  Normal coags (INR is 1).  Normal BNP.  Normal lactic acid level. Foley catheter placed; Right IJ Central venous catheter placed.  Patient S/P 4L of NS by the time he arrived to [facility] and was complaining of shortness of breath upon arrival.  IV fluids have been suspended for now.  Past Psychiatric History: She has been diagnosed with a schizoaffective disorder and multiple chronic pain syndromes. Patient has previous acute psychiatric hospitalization behavioral [place].   Risk to Self:   Risk to Others:   Prior Inpatient Therapy:   Prior Outpatient Therapy:    Past Medical History:  Past Medical History:  Diagnosis Date  . Accelerated hypertension 08/23/2016  . Anxiety   . Arthritis   . Bipolar affective disorder ([place])   . Chronic back pain   . Chronic kidney disease    AKF - due to Rhabdomyolysis  . DDD (degenerative disc disease)   . Depression   . DJD (degenerative joint disease)   . Elevated liver function tests   . Fibromyalgia   . GERD (gastroesophageal reflux disease)   . Head injury   . Head injury, closed, with concussion    x 3  . Hyperlipidemia   . Hypothyroidism   . IBS (irritable bowel syndrome)   . Inguinal hernia     left  . Insomnia   . Myofascial pain syndrome   . Osteopenia   . Shortness of breath dyspnea    with exertion  . Tibial plateau fracture, left     Past Surgical History:  Procedure Laterality Date  . CARDIAC CATHETERIZATION N/A 08/31/2015   Procedure: Left Heart Cath and Coronary Angiography;  Surgeon: Leonie Man, MD;  Location: [place] CV [facility];  Service: Cardiovascular;  Laterality: N/A;  . Carotid Dopplers Bilateral 02/20/2015   [facility]: Mild, less than 39% left and right internal carotid artery stenosis. No significant plaque burden  . CERVICAL DISC SURGERY     C5-7  . COLONOSCOPY    . COLONOSCOPY    . ESOPHAGOGASTRODUODENOSCOPY    . FASCIOTOMY Left 10/20/2013   Procedure: LEFT leg ANTERIOR COMPARTMENT FACSCIOTOMY;  Surgeon: Rozanna Box, MD;  Location: [place];  Service: Orthopedics;  Laterality: Left;  . INGUINAL HERNIA REPAIR Left   . Nuclear Stress Test  03/06/2015   [facility]: Normal EKG. Low normal EF (49%) normal regional wall motion. No evidence of ischemia or infarction.  . ORIF TIBIA PLATEAU Left 10/20/2013   Procedure: OPEN REDUCTION INTERNAL FIXATION (ORIF) LEFT TIBIAL PLATEAU;  Surgeon: Rozanna Box, MD;  Location: [place];  Service: Orthopedics;  Laterality: Left;  . SEPTOPLASTY    . SPINAL CORD STIMULATOR BATTERY EXCHANGE N/A 02/08/2016   Procedure: Lumbar spinal cord  stimulator implantable pulse generator replacement;  Surgeon: Clydell Hakim, MD;  Location: [place] [facility];  Service: Neurosurgery;  Laterality: N/A;  . SPINAL CORD STIMULATOR IMPLANT    . TRANSTHORACIC ECHOCARDIOGRAM  02/20/2015   [facility]: Mild concentric LVH. EF 55-60%. Normal regional wall motion. Mild to moderate TR with no significant pulmonary hypertension.   Family History:  Family History  Problem Relation Age of Onset  . Heart disease Mother   . Hypertension Mother   . Sudden death Mother     Presumably cardiac  . Hyperlipidemia Father   .  Heart attack Father 31    At least 5 MIs. Had CABG.  . Heart failure Father   . Diabetes Father   . Hypertension Father   . Kidney disease Father   . Diabetes Brother   . Lung cancer Maternal Grandmother   . Diabetes Paternal Grandmother   . Sudden death Paternal Grandmother     Unclear etiology  . Diabetes    . Kidney disease    . Kidney disease Brother   . Hypertension Brother   . Sudden death Brother     Thought to be related to heart disease  . Throat cancer Brother   . Colon cancer Neg Hx    Family Psychiatric  History: Noncontributory Social History:  History  Alcohol Use No     History  Drug Use  . Types: Oxycodone, Cocaine    Comment: rarely- last 2015-    Social History   Social History  . Marital status: Married    Spouse name: N/A  . Number of children: 1  . Years of education: N/A   Occupational History  . disabled    Social History Main Topics  . Smoking status: Former Smoker    Packs/day: 1.00    Years: 35.00    Types: Cigarettes  . Smokeless tobacco: Never Used     Comment: 2015  . Alcohol use No  . Drug use: Yes    Types: Oxycodone, Cocaine     Comment: rarely- last 2015-  . Sexual activity: Not on file   Other Topics Concern  . Not on file   Social History Narrative   He is a separated father of 1. His daughter who is in nursing school lives with him part-time.   He is currently disabled due to chronic back pain.    He did some college education.   He currently is trying to use electronic cigarettes but has smoked one pack a day for over 35 years.   He has recently stopped using prescription benzodiazepine and pain medications because he is in the process of changing pain med doctors.   He does not routinely exercise secondary to back, neck pain as well as dyspnea.   Additional Social History:    Allergies:   Allergies  Allergen Reactions  . Zolpidem Tartrate Other (See Comments)    Hallucinations and sleep walks  . Demerol  [Meperidine] Itching, Rash and Hives    Labs:  Results for orders placed or performed during the hospital encounter of 08/23/16 (from the past 48 hour(s))  Glucose, capillary     Status: None   Collection Time: 08/26/16  3:28 PM  Result Value Ref Range   Glucose-Capillary 85 65 - 99 mg/dL  Triglycerides     Status: Abnormal   Collection Time: 08/27/16  3:30 AM  Result Value Ref Range   Triglycerides 307 (H) <150 mg/dL  Renal function panel  Status: Abnormal   Collection Time: 08/27/16  3:30 AM  Result Value Ref Range   Sodium 139 135 - 145 mmol/L   Potassium 4.4 3.5 - 5.1 mmol/L    Comment: DELTA CHECK NOTED   Chloride 98 (L) 101 - 111 mmol/L   CO2 23 22 - 32 mmol/L   Glucose, Bld 84 65 - 99 mg/dL   BUN 72 (H) 6 - 20 mg/dL   Creatinine, Ser 9.89 (H) 0.61 - 1.24 mg/dL   Calcium 8.3 (L) 8.9 - 10.3 mg/dL   Phosphorus 9.1 (H) 2.5 - 4.6 mg/dL   Albumin 2.1 (L) 3.5 - 5.0 g/dL   GFR calc non Af Amer 5 (L) >60 mL/min   GFR calc Af Amer 6 (L) >60 mL/min    Comment: (NOTE) The eGFR has been calculated using the CKD EPI equation. This calculation has not been validated in all clinical situations. eGFR's persistently <60 mL/min signify possible Chronic Kidney Disease.    Anion gap 18 (H) 5 - 15  CK     Status: Abnormal   Collection Time: 08/27/16  3:30 AM  Result Value Ref Range   Total CK 1,412 (H) 49 - 397 U/L  CBC     Status: Abnormal   Collection Time: 08/27/16  3:30 AM  Result Value Ref Range   WBC 8.6 4.0 - 10.5 K/uL   RBC 3.97 (L) 4.22 - 5.81 MIL/uL   Hemoglobin 11.4 (L) 13.0 - 17.0 g/dL   HCT 33.4 (L) 39.0 - 52.0 %   MCV 84.1 78.0 - 100.0 fL   MCH 28.7 26.0 - 34.0 pg   MCHC 34.1 30.0 - 36.0 g/dL   RDW 13.2 11.5 - 15.5 %   Platelets 205 150 - 400 K/uL  Magnesium     Status: None   Collection Time: 08/27/16  3:30 AM  Result Value Ref Range   Magnesium 2.4 1.7 - 2.4 mg/dL  Blood gas, arterial     Status: Abnormal   Collection Time: 08/27/16  5:09 AM  Result Value  Ref Range   FIO2 40.00    Delivery systems VENTILATOR    Mode PRESSURE REGULATED VOLUME CONTROL    VT 620 mL   LHR 14 resp/min   Peep/cpap 5.0 cm H20   pH, Arterial 7.329 (L) 7.350 - 7.450   pCO2 arterial 41.9 32.0 - 48.0 mmHg   pO2, Arterial 86.6 83.0 - 108.0 mmHg   Bicarbonate 21.4 20.0 - 28.0 mmol/L   Acid-base deficit 3.6 (H) 0.0 - 2.0 mmol/L   O2 Saturation 95.5 %   Patient temperature 98.6    Collection site RIGHT RADIAL    Drawn by [phone]    Sample type ARTERIAL DRAW    Allens test (pass/fail) PASS PASS  CK     Status: Abnormal   Collection Time: 08/28/16  4:27 AM  Result Value Ref Range   Total CK 795 (H) 49 - 397 U/L  CBC     Status: Abnormal   Collection Time: 08/28/16  4:27 AM  Result Value Ref Range   WBC 8.7 4.0 - 10.5 K/uL   RBC 3.75 (L) 4.22 - 5.81 MIL/uL   Hemoglobin 10.7 (L) 13.0 - 17.0 g/dL   HCT 31.3 (L) 39.0 - 52.0 %   MCV 83.5 78.0 - 100.0 fL   MCH 28.5 26.0 - 34.0 pg   MCHC 34.2 30.0 - 36.0 g/dL   RDW 12.9 11.5 - 15.5 %   Platelets 201 150 - 400  K/uL  Magnesium     Status: None   Collection Time: 08/28/16  4:27 AM  Result Value Ref Range   Magnesium 2.3 1.7 - 2.4 mg/dL  Renal function panel     Status: Abnormal   Collection Time: 08/28/16  4:27 AM  Result Value Ref Range   Sodium 138 135 - 145 mmol/L   Potassium 3.8 3.5 - 5.1 mmol/L   Chloride 98 (L) 101 - 111 mmol/L   CO2 24 22 - 32 mmol/L   Glucose, Bld 73 65 - 99 mg/dL   BUN 56 (H) 6 - 20 mg/dL   Creatinine, Ser 7.57 (H) 0.61 - 1.24 mg/dL   Calcium 8.3 (L) 8.9 - 10.3 mg/dL   Phosphorus 7.4 (H) 2.5 - 4.6 mg/dL   Albumin 2.0 (L) 3.5 - 5.0 g/dL   GFR calc non Af Amer 7 (L) >60 mL/min   GFR calc Af Amer 9 (L) >60 mL/min    Comment: (NOTE) The eGFR has been calculated using the CKD EPI equation. This calculation has not been validated in all clinical situations. eGFR's persistently <60 mL/min signify possible Chronic Kidney Disease.    Anion gap 16 (H) 5 - 15    Current  Facility-Administered Medications  Medication Dose Route Frequency Provider Last Rate Last Dose  . 0.9 %  sodium chloride infusion  100 mL Intravenous PRN Roney Jaffe, MD   Stopped at 08/24/16 2200  . 0.9 %  sodium chloride infusion  100 mL Intravenous PRN Roney Jaffe, MD      . 0.9 %  sodium chloride infusion  100 mL Intravenous PRN Roney Jaffe, MD      . 0.9 %  sodium chloride infusion  100 mL Intravenous PRN Roney Jaffe, MD      . albuterol (PROVENTIL) (2.5 MG/3ML) 0.083% nebulizer solution 2.5 mg  2.5 mg Nebulization Q4H PRN Rush Farmer, MD      . alteplase (CATHFLO ACTIVASE) injection 2 mg  2 mg Intracatheter Once PRN Roney Jaffe, MD      . aspirin EC tablet 81 mg  81 mg Oral Daily Lily Kocher, MD   81 mg at 08/25/16 0919  . Chlorhexidine Gluconate Cloth 2 % PADS 6 each  6 each Topical Daily Elsie Stain, MD   6 each at 08/27/16 1000  . docusate (COLACE) 50 MG/5ML liquid 100 mg  100 mg Per Tube BID PRN Guy Begin, MD      . fentaNYL (SUBLIMAZE) injection 50 mcg  50 mcg Intravenous Once Guy Begin, MD      . heparin injection 1,000 Units  1,000 Units Dialysis PRN Roney Jaffe, MD   1,000 Units at 08/28/16 1109  . heparin injection 3,000 Units  3,000 Units Dialysis PRN Roney Jaffe, MD   3,000 Units at 08/24/16 1000  . heparin injection 3,500 Units  3,500 Units Dialysis PRN Roney Jaffe, MD      . heparin injection 5,000 Units  5,000 Units Subcutaneous Q8H Rush Farmer, MD   5,000 Units at 08/28/16 0559  . hydrocortisone cream 1 %   Topical QID PRN Elsie Stain, MD      . HYDROmorphone (DILAUDID) injection 0.5 mg  0.5 mg Intravenous Q3H PRN Lily Kocher, MD   0.5 mg at 08/28/16 1130  . levothyroxine (SYNTHROID, LEVOTHROID) injection 37.5 mcg  37.5 mcg Intravenous Daily Maryann Mikhail, DO   37.5 mcg at 08/28/16 0935  . lidocaine (PF) (XYLOCAINE) 1 % injection 5 mL  5 mL Intradermal PRN Roney Jaffe, MD      . lidocaine-prilocaine (EMLA) cream  1 application  1 application Topical PRN Roney Jaffe, MD      . MEDLINE mouth rinse  15 mL Mouth Rinse BID Elsie Stain, MD   15 mL at 08/27/16 2245  . metoprolol (LOPRESSOR) injection 5 mg  5 mg Intravenous Q6H Jamal Maes, MD   5 mg at 08/28/16 1130  . ondansetron (ZOFRAN) tablet 4 mg  4 mg Oral Q6H PRN Lily Kocher, MD       Or  . ondansetron [facility]) injection 4 mg  4 mg Intravenous Q6H PRN Lily Kocher, MD   4 mg at 08/28/16 0837  . pantoprazole (PROTONIX) injection 40 mg  40 mg Intravenous Q24H Maryann Mikhail, DO   40 mg at 08/28/16 1147  . pentafluoroprop-tetrafluoroeth (GEBAUERS) aerosol 1 application  1 application Topical PRN Roney Jaffe, MD      . sodium chloride flush (NS) 0.9 % injection 10-40 mL  10-40 mL Intracatheter Q12H Elsie Stain, MD   10 mL at 08/28/16 0920  . sodium chloride flush (NS) 0.9 % injection 3 mL  3 mL Intravenous Q12H Lily Kocher, MD   3 mL at 08/28/16 0919  . zolpidem (AMBIEN) tablet 5 mg  5 mg Oral QHS PRN Rush Farmer, MD        Musculoskeletal: Strength & Muscle Tone: decreased Gait & Station: unable to stand Patient leans: N/A  Psychiatric Specialty Exam: Physical Exam  ROS  Blood pressure (!) 167/85, pulse 81, temperature 98.7 F (37.1 C), temperature source Oral, resp. rate 14, height 6' (1.829 m), weight 110.8 kg (244 lb 4.3 oz), SpO2 98 %.Body mass index is 33.13 kg/m.  General Appearance: Casual  Eye Contact:  Good  Speech:  Clear and Coherent  Volume:  Decreased  Mood:  Anxious and Depressed  Affect:  Constricted and Depressed  Thought Process:  Coherent and Goal Directed  Orientation:  Full (Time, Place, and Person)  Thought Content:  WDL  Suicidal Thoughts:  No  Homicidal Thoughts:  No  Memory:  Immediate;   Good Recent;   Fair Remote;   Fair  Judgement:  Impaired  Insight:  Good  Psychomotor Activity:  Decreased  Concentration:  Concentration: Fair and Attention Span: Fair  Recall:  Good  Fund of  Knowledge:  Good  Language:  Good  Akathisia:  Negative  Handed:  Right  AIMS (if indicated):     Assets:  Communication Skills Desire for Improvement Financial Resources/Insurance Housing Intimacy Leisure Time Resilience Social Support Talents/Skills  ADL's:  Impaired  Cognition:  WNL  Sleep:        Treatment Plan Summary: 51 years old male with schizophrenia and bipolar disorder and chronic pain syndrome presented with the status post unintention Drug overdose and altered mental status with the rhabdomyolysis. Patient and his family reported he has no intention to end his life or homicidal himself. Reportedly he has been receiving outpatient medication management and medication has been working and has no side effects. Reviewed patient labs which has a multiple abnormal including's total CK also 95, elevated bun and creatinine. 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  Patient does not meet criteria for inpatient psychiatric hospitalization Agree with holding psychiatric  medications until patient is medically cleared due to rhabdomyolysis Patient will be referred to the outpatient medication management at neuropsychiatric when medically stable  Appreciate psychiatric consultation and we sign off as of today Please contact [phone] or [phone] if needs further assistance   Disposition: No evidence of imminent risk to self or others at present.   Supportive therapy provided about ongoing stressors.  Ambrose Finland, MD 08/28/2016 3:08 PM

## 2016-08-28 NOTE — Procedures (Signed)
Hemodialysis Insertion Procedure Note [name] [id number] Feb 19, 1966  Procedure: Insertion of Hemodialysis Catheter Type: 3 port  Indications: Hemodialysis   Procedure Details Consent: Risks of procedure as well as the alternatives and risks of each were explained to the (patient/caregiver).  Consent for procedure obtained. Time Out: Verified patient identification, verified procedure, site/side was marked, verified correct patient position, special equipment/implants available, medications/allergies/relevent history reviewed, required imaging and test results available.  Performed  Maximum sterile technique was used including antiseptics, cap, gloves, gown, hand hygiene, mask and sheet. Skin prep: Chlorhexidine; local anesthetic administered A antimicrobial bonded/coated triple lumen catheter was placed in the left internal jugular vein using the Seldinger technique. Ultrasound guidance used.Yes.   Catheter placed to 20 cm. Blood aspirated via all 3 ports and then flushed x 3. Line sutured x 2 and dressing applied.  Evaluation Blood flow good Complications: No apparent complications Patient did tolerate procedure well. Chest X-ray ordered to verify placement.  CXR: pending.  Richardson Landry Minor ACNP Maryanna Shape PCCM Pager [phone] till 3 pm If no answer page [phone] 08/28/2016, 11:00 AM  I was present and supervised the procedure.  Rush Farmer, M.D. [facility] Pulmonary/Critical Care Medicine. Pager: [phone]. After hours pager: [phone].

## 2016-08-28 NOTE — Progress Notes (Addendum)
PULMONARY / CRITICAL CARE MEDICINE   Name: [name] MRN: [id number] DOB: Dec 05, 1965    ADMISSION DATE:  08/23/2016 CONSULTATION DATE:  08/24/16  REFERRING MD:  Dr. Eulas Boyd  CHIEF COMPLAINT:  Respiratory failure  HISTORY OF PRESENT ILLNESS:   Please see H&P for full admission details.  Briefly, [name] is a 51 y/o man admitted in the early morning of 08/23/16 with rhabdomyolysis after an ingestion (wife thinks it was unintentional).  Over the course of his admission his renal function continued to decline and he developed worsening azotemia with altered mental status requiring dialysis.  He then developed increasing hypoxia thought to be due to fluid overload and he was emergently intubated.  SUBJECTIVE:  Confused this AM, constipated  VITAL SIGNS: BP (!) 149/83 (BP Location: Right Arm)   Pulse 87   Temp 99.4 F (37.4 C) (Oral)   Resp 10   Ht 6' (1.829 m)   Wt 110.8 kg (244 lb 4.3 oz)   SpO2 97%   BMI 33.13 kg/m   HEMODYNAMICS:    VENTILATOR SETTINGS:    INTAKE / OUTPUT: I/O last 3 completed shifts: In: 888.3 [I.V.:888.3] Out: 525 [Urine:525]  PHYSICAL EXAMINATION: General:  Awake and interactive, speech not clear but following commands Neuro:  Moving all ext to command HEENT:  [place]/AT, PERRL, EOM-I and MMM Cardiovascular:   RRR, Nl S1/S2, -M/R/G. Lungs:  CTA bilaterally Abdomen:  Soft, NT, ND and +BS Musculoskeletal:  No peripheral edema, no joint abnormalities Skin:  No rashes or other lesions  LABS:  BMET  Recent Labs Lab 08/26/16 0425 08/27/16 0330 08/28/16 0427  NA 137 139 138  K 3.4* 4.4 3.8  CL 98* 98* 98*  CO2 24 23 24   BUN 52* 72* 56*  CREATININE 7.80* 9.89* 7.57*  GLUCOSE 91 84 73   Electrolytes  Recent Labs Lab 08/26/16 0425 08/27/16 0330 08/28/16 0427  CALCIUM 8.2* 8.3* 8.3*  MG 2.3 2.4 2.3  PHOS 6.2* 9.1* 7.4*   CBC  Recent Labs Lab 08/26/16 0425 08/27/16 0330 08/28/16 0427  WBC 8.0 8.6 8.7  HGB 11.1* 11.4* 10.7*   HCT 32.1* 33.4* 31.3*  PLT 160 205 201   Coag's No results for input(s): APTT, INR in the last 168 hours.  Sepsis Markers No results for input(s): LATICACIDVEN, PROCALCITON, O2SATVEN in the last 168 hours.  ABG  Recent Labs Lab 08/24/16 0553 08/25/16 0359 08/27/16 0509  PHART 7.318* 7.509* 7.329*  PCO2ART 43.7 29.3* 41.9  PO2ART 335.0* 66.0* 86.6   Liver Enzymes   Recent Labs Lab 08/23/16 0301 08/24/16 0409  08/26/16 0425 08/27/16 0330 08/28/16 0427  AST 1,001* 494*  --   --   --   --   ALT 284* 221*  --   --   --   --   ALKPHOS 71 72  --   --   --   --   BILITOT 0.9 0.6  --   --   --   --   ALBUMIN 2.7* 2.5*  < > 2.3* 2.1* 2.0*  < > = values in this interval not displayed. Cardiac Enzymes No results for input(s): TROPONINI, PROBNP in the last 168 hours.  Glucose  Recent Labs Lab 08/25/16 0759 08/26/16 1528  GLUCAP 98 85   Imaging No results found. STUDIES:  Abd [place] 3/24 - no acute process  CULTURES: None  ANTIBIOTICS: None  SIGNIFICANT EVENTS: Intubated 08/24/16>>>3/29  LINES/TUBES: Dialysis cath 3/24 - Right femoral>>>3/29 Right IJ CVC - 3/24>>>  I reviewed CXR myself, lines in good position, no acute disease noted  DISCUSSION: 51 y/o man with rhabdo 2/2 overdose now with azotemia resulting in altered mental status and fluid overload with hypoxic and hypercarbic respiratory failure  ASSESSMENT / PLAN:  PULMONARY A: Respiratory failure P:   Titrate O2 for sat of 88-92%. Ambulate IS per RT protocol Flutter valve  RENAL A:   AKI - due to rhabdomyolysis P:   HD done 3/28, hold today, replace HD catheter from femoral to IJ Replace electrolytes as indicated BMET in AM [facility] IVF  INFECTIOUS A:   At risk for aspiration pna P:   Monitor for fever Follow WBC  ENDOCRINE A:   hyperglycemia   P:   SSI CBGs  NEUROLOGIC A:   Sedated Chronic pain Suicide attempt P:   D/C all sedation Psych consult called for suicide  assessment. Suicide precautions.  FAMILY  - Updates: No family bedside, transfer to SDU and to [facility] service with PCCM off 3/30.  - Inter-disciplinary family meet or Palliative Care meeting due by:  09/01/16  Discussed with renal-MD will replace TLC today.  [name], M.D. [facility] Pulmonary/Critical Care Medicine. Pager: [phone]. After hours pager: [phone].  08/28/2016, 10:23 AM

## 2016-08-28 NOTE — Progress Notes (Signed)
[facility] Progress Note   Subjective:  s/p HD X 3 (3/25, 3/26, 3/28) Fem cath needs removal so can get OOB Says "you will have to dialyze me in another facility" - implied had an issue w/the nurse who did his TMT yesterday but won't share with me what the issue was BP looking better with higher metoprolol dose Denies SOB Wants pain meds for back and L leg  Vitals:   08/28/16 0500 08/28/16 0600 08/28/16 0700 08/28/16 0713  BP: (!) 149/80 (!) 145/89 (!) 152/85 (!) 152/85  Pulse: 91 91 89 91  Resp: 13 12 13 12   Temp:    99.4 F (37.4 C)  TempSrc:    Oral  SpO2: 94% 95% 96% 95%  Weight:      Height:        Exam: VS as noted above NAD Sclera anicteric No jvd or bruits Chest anteriorly clear Abd soft and not tender Foley in place, making some urine, lightening up No edema of LE's R fem HD cath dsg intact (3/24)    Inpatient medications: . aspirin EC  81 mg Oral Daily  . Chlorhexidine Gluconate Cloth  6 each Topical Daily  . fentaNYL (SUBLIMAZE) injection  50 mcg Intravenous Once  . heparin subcutaneous  5,000 Units Subcutaneous Q8H  . levothyroxine  37.5 mcg Intravenous Daily  . mouth rinse  15 mL Mouth Rinse BID  . metoprolol  5 mg Intravenous Q6H  . pantoprazole (PROTONIX) IV  40 mg Intravenous Q24H  . sodium chloride flush  10-40 mL Intracatheter Q12H  . sodium chloride flush  3 mL Intravenous Q12H   . propofol (DIPRIVAN) infusion 15 mcg/kg/min (08/27/16 0644)   sodium chloride, sodium chloride, sodium chloride, sodium chloride, albuterol, alteplase, docusate, heparin, heparin, heparin, hydrocortisone cream, HYDROmorphone (DILAUDID) injection, lidocaine (PF), lidocaine-prilocaine, ondansetron **OR** ondansetron (ZOFRAN) IV, pentafluoroprop-tetrafluoroeth, zolpidem   Recent Labs Lab 08/26/16 0425 08/27/16 0330 08/28/16 0427  NA 137 139 138  K 3.4* 4.4 3.8  CL 98* 98* 98*  CO2 24 23 24   GLUCOSE 91 84 73  BUN 52* 72* 56*  CREATININE 7.80*  9.89* 7.57*  CALCIUM 8.2* 8.3* 8.3*  PHOS 6.2* 9.1* 7.4*    Recent Labs Lab 08/23/16 0301 08/24/16 0409  08/26/16 0425 08/27/16 0330 08/28/16 0427  AST 1,001* 494*  --   --   --   --   ALT 284* 221*  --   --   --   --   ALKPHOS 71 72  --   --   --   --   BILITOT 0.9 0.6  --   --   --   --   PROT 5.4* 5.2*  --   --   --   --   ALBUMIN 2.7* 2.5*  < > 2.3* 2.1* 2.0*  < > = values in this interval not displayed.  Recent Labs Lab 08/23/16 0301  08/26/16 0425 08/27/16 0330 08/28/16 0427  WBC 9.3  < > 8.0 8.6 8.7  NEUTROABS 7.8*  --   --   --   --   HGB 11.2*  < > 11.1* 11.4* 10.7*  HCT 32.6*  < > 32.1* 33.4* 31.3*  MCV 83.8  < > 83.6 84.1 83.5  PLT 98*  < > 160 205 201  < > = values in this interval not displayed.  Background:  51 y.o. year-old with history of bipolar d/o, schizophrenia, chronic pain who presented to outside hospital after being found down  at home by his family.  Taken to ED where labs showed high BUN/ Cr. (BUN >100, creatinine 10). (Baseline creatinine 1.12 on 02/08/16).  Asked to see for renal failure. (Has history of rhabdo, AKI, bipolar d/o, suicide attempt (2011), syncope (2005) in the past). HD initiated 08/24/16  Assessment: 1. Acute kidney injury - ATN due to severe rhabdomyolysis.  s/p HD X 3 (3/25, 3/26, 3/28).  1. Volume status looks much better.(Weight down about 7 kg since admission) and looks euvolemic   2. Still oliguric 3. Had HD 3/28 (yesterday), will d/c fem cath, will need new IJ line - CCM aware (doesn't need today - tomorrow would be OK) 4. Add phos binder when taking po (has not passed swallow eval yet)  2. Rhabdomyolysis, acute - CPK  falling 3. Bipolar d/o w suspected drug OD (wife thinks was unintentional) 4. Schizophrenia 5. Hx suicide attempt in the past 6. Chronic pain 7. Hypertension/tachycardia  1. Still NPO.  2. On metoprolol 5 mg IV Q6H - much improved  Jamal Maes, MD [facility] [phone]  Pager 08/28/2016, 8:18 AM

## 2016-08-28 NOTE — Progress Notes (Signed)
Patient transferred from [facility]. Report received from [place], [place]. VSS with exception of elevated BP. Pt c/o 7/10 back pain. Pt repositioned in chair with chair alarm activated. 2L Nasal canula in place. Pt A/O x4; sarcastic. Pt oriented to room, call light within reach. All questions answered. Transferring nurse updated patient regarding contacting wife with new location update. Will continue to monitor and intervene PRN.

## 2016-08-28 NOTE — Progress Notes (Signed)
Called to pt room to respond to complaint of chest pain. Pt in NAD complaining of pressure radiating from the abdomen to the central chest. Pain 6/10.  12 lead EKG obtained and in chart. Dr Corinna Lines with [facility] notified. Order placed. Will continue to monitor.

## 2016-08-29 DIAGNOSIS — I10 Essential (primary) hypertension: Secondary | ICD-10-CM

## 2016-08-29 DIAGNOSIS — R5381 Other malaise: Secondary | ICD-10-CM

## 2016-08-29 LAB — CBC
HCT: 32.8 % — ABNORMAL LOW (ref 39.0–52.0)
HEMOGLOBIN: 11.3 g/dL — AB (ref 13.0–17.0)
MCH: 28.3 pg (ref 26.0–34.0)
MCHC: 34.5 g/dL (ref 30.0–36.0)
MCV: 82.2 fL (ref 78.0–100.0)
PLATELETS: 220 10*3/uL (ref 150–400)
RBC: 3.99 MIL/uL — AB (ref 4.22–5.81)
RDW: 12.5 % (ref 11.5–15.5)
WBC: 10.4 10*3/uL (ref 4.0–10.5)

## 2016-08-29 LAB — MAGNESIUM: MAGNESIUM: 2.4 mg/dL (ref 1.7–2.4)

## 2016-08-29 LAB — RENAL FUNCTION PANEL
ANION GAP: 14 (ref 5–15)
Albumin: 2.3 g/dL — ABNORMAL LOW (ref 3.5–5.0)
BUN: 80 mg/dL — ABNORMAL HIGH (ref 6–20)
CALCIUM: 8.6 mg/dL — AB (ref 8.9–10.3)
CO2: 25 mmol/L (ref 22–32)
CREATININE: 8.94 mg/dL — AB (ref 0.61–1.24)
Chloride: 96 mmol/L — ABNORMAL LOW (ref 101–111)
GFR calc Af Amer: 7 mL/min — ABNORMAL LOW (ref >60)
GFR, EST NON AFRICAN AMERICAN: 6 mL/min — AB (ref >60)
Glucose, Bld: 100 mg/dL — ABNORMAL HIGH (ref 65–99)
PHOSPHORUS: 7.4 mg/dL — AB (ref 2.5–4.6)
Potassium: 3.8 mmol/L (ref 3.5–5.1)
Sodium: 135 mmol/L (ref 135–145)

## 2016-08-29 LAB — TROPONIN I

## 2016-08-29 MED ORDER — DOXYCYCLINE HYCLATE 100 MG PO TABS
100.0000 mg | ORAL_TABLET | Freq: Two times a day (BID) | ORAL | Status: AC
  Administered 2016-08-29 – 2016-09-04 (×13): 100 mg via ORAL
  Filled 2016-08-29 (×14): qty 1

## 2016-08-29 MED ORDER — OXYCODONE HCL 5 MG PO TABS
10.0000 mg | ORAL_TABLET | Freq: Three times a day (TID) | ORAL | Status: DC | PRN
  Administered 2016-08-29 – 2016-09-09 (×32): 10 mg via ORAL
  Filled 2016-08-29 (×32): qty 2

## 2016-08-29 MED ORDER — IPRATROPIUM-ALBUTEROL 0.5-2.5 (3) MG/3ML IN SOLN
3.0000 mL | Freq: Three times a day (TID) | RESPIRATORY_TRACT | Status: DC
  Administered 2016-08-29: 3 mL via RESPIRATORY_TRACT
  Filled 2016-08-29: qty 3

## 2016-08-29 MED ORDER — ALTEPLASE 2 MG IJ SOLR
INTRAMUSCULAR | Status: AC
  Administered 2016-08-29: 2 mg
  Filled 2016-08-29: qty 4

## 2016-08-29 MED ORDER — IPRATROPIUM-ALBUTEROL 0.5-2.5 (3) MG/3ML IN SOLN
3.0000 mL | Freq: Three times a day (TID) | RESPIRATORY_TRACT | Status: DC
  Administered 2016-08-29 – 2016-09-01 (×6): 3 mL via RESPIRATORY_TRACT
  Filled 2016-08-29 (×7): qty 3

## 2016-08-29 MED ORDER — BISACODYL 10 MG RE SUPP
10.0000 mg | Freq: Once | RECTAL | Status: AC
Start: 2016-08-29 — End: 2016-08-29
  Administered 2016-08-29: 10 mg via RECTAL
  Filled 2016-08-29: qty 1

## 2016-08-29 MED ORDER — DOCUSATE SODIUM 50 MG/5ML PO LIQD
100.0000 mg | Freq: Two times a day (BID) | ORAL | Status: DC
  Administered 2016-08-29 – 2016-09-04 (×11): 100 mg
  Filled 2016-08-29 (×13): qty 10

## 2016-08-29 MED ORDER — METOPROLOL TARTRATE 50 MG PO TABS
50.0000 mg | ORAL_TABLET | Freq: Two times a day (BID) | ORAL | Status: DC
  Administered 2016-08-29 – 2016-09-09 (×22): 50 mg via ORAL
  Filled 2016-08-29 (×23): qty 1

## 2016-08-29 MED ORDER — SENNA 8.6 MG PO TABS
1.0000 | ORAL_TABLET | Freq: Every day | ORAL | Status: DC
  Administered 2016-08-30: 8.6 mg via ORAL
  Filled 2016-08-29: qty 1

## 2016-08-29 MED ORDER — METOPROLOL TARTRATE 5 MG/5ML IV SOLN
5.0000 mg | Freq: Four times a day (QID) | INTRAVENOUS | Status: DC | PRN
  Administered 2016-09-01: 5 mg via INTRAVENOUS
  Filled 2016-08-29: qty 5

## 2016-08-29 MED ORDER — CALCIUM ACETATE (PHOS BINDER) 667 MG PO CAPS
667.0000 mg | ORAL_CAPSULE | Freq: Three times a day (TID) | ORAL | Status: DC
  Administered 2016-08-29 – 2016-09-06 (×19): 667 mg via ORAL
  Filled 2016-08-29 (×20): qty 1

## 2016-08-29 NOTE — Consult Note (Signed)
Physical Medicine and Rehabilitation Consult Reason for Consult: Decreased functional mobility related to rhabdomyolysis with respiratory failure Referring Physician: [facility]   HPI: [name] is a 51 y.o. right handed male with history of TBI, bipolar disorder, schizophrenia, history of unintentional drug overdose on 2 prior episodes, chronic back and neck pain with spine stimulator 02/08/2016, fibromyalgia. Per chart review patient lives alone and ambulates with a cane secondary to history of tibial plateau fracture years ago. He has a personal care attendant 3 days a week for one half hours a day. One level home. His ex-wife and daughter help intermittently. Presented 08/23/2016 to outside hospital after after found down with altered mental status and generalized weakness no nausea or vomiting reported. EKG showed sinus tachycardia. Chest x-ray negative. CPK 119,000, BUN 108, creatinine 10.4, troponin negative. Patient did require intubation. Renal service is consulted for acute renal failure and placed on IV fluids and patient did receive hemodialysis 3 with latest session 08/27/2016. Urine drug screen positive opiates and benzos. He remained intubated through 08/28/2016. Psychiatry was consulted for suicide assessment. Plan palliative care follow-up meeting 09/01/2016. Subcutaneous heparin for DVT prophylaxis. Physical therapy evaluation completed 08/27/2016 with recommendations of physical medicine rehabilitation consult.   Review of Systems  Constitutional: Negative for chills and fever.  HENT: Negative for hearing loss.   Eyes: Negative for blurred vision and double vision.  Respiratory: Positive for cough and shortness of breath.   Cardiovascular: Positive for palpitations and leg swelling. Negative for chest pain.  Gastrointestinal: Positive for constipation. Negative for nausea and vomiting.       GERD  Genitourinary: Negative for dysuria and hematuria.  Musculoskeletal:  Positive for back pain, joint pain, myalgias and neck pain.  Skin: Negative for rash.  Neurological: Positive for dizziness, weakness and headaches.  Psychiatric/Behavioral: Positive for depression.       Schizophrenia, anxiety  All other systems reviewed and are negative.  Past Medical History:  Diagnosis Date  . Accelerated hypertension 08/23/2016  . Anxiety   . Arthritis   . Bipolar affective disorder ([place])   . Chronic back pain   . Chronic kidney disease    AKF - due to Rhabdomyolysis  . DDD (degenerative disc disease)   . Depression   . DJD (degenerative joint disease)   . Elevated liver function tests   . Fibromyalgia   . GERD (gastroesophageal reflux disease)   . Head injury   . Head injury, closed, with concussion    x 3  . Hyperlipidemia   . Hypothyroidism   . IBS (irritable bowel syndrome)   . Inguinal hernia    left  . Insomnia   . Myofascial pain syndrome   . Osteopenia   . Shortness of breath dyspnea    with exertion  . Tibial plateau fracture, left    Past Surgical History:  Procedure Laterality Date  . CARDIAC CATHETERIZATION N/A 08/31/2015   Procedure: Left Heart Cath and Coronary Angiography;  Surgeon: Leonie Man, MD;  Location: [place] [facility];  Service: Cardiovascular;  Laterality: N/A;  . Carotid Dopplers Bilateral 02/20/2015   [facility]: Mild, less than 39% left and right internal carotid artery stenosis. No significant plaque burden  . CERVICAL DISC SURGERY     C5-7  . COLONOSCOPY    . COLONOSCOPY    . ESOPHAGOGASTRODUODENOSCOPY    . FASCIOTOMY Left 10/20/2013   Procedure: LEFT leg ANTERIOR COMPARTMENT FACSCIOTOMY;  Surgeon: Rozanna Box, MD;  Location: [place];  Service: Orthopedics;  Laterality: Left;  . INGUINAL HERNIA REPAIR Left   . Nuclear Stress Test  03/06/2015   [facility]: Normal EKG. Low normal EF (49%) normal regional wall motion. No evidence of ischemia or infarction.  . ORIF TIBIA PLATEAU Left  10/20/2013   Procedure: OPEN REDUCTION INTERNAL FIXATION (ORIF) LEFT TIBIAL PLATEAU;  Surgeon: Rozanna Box, MD;  Location: [place];  Service: Orthopedics;  Laterality: Left;  . SEPTOPLASTY    . SPINAL CORD STIMULATOR BATTERY EXCHANGE N/A 02/08/2016   Procedure: Lumbar spinal cord stimulator implantable pulse generator replacement;  Surgeon: Clydell Hakim, MD;  Location: [place] [facility];  Service: Neurosurgery;  Laterality: N/A;  . SPINAL CORD STIMULATOR IMPLANT    . TRANSTHORACIC ECHOCARDIOGRAM  02/20/2015   [facility]: Mild concentric LVH. EF 55-60%. Normal regional wall motion. Mild to moderate TR with no significant pulmonary hypertension.   Family History  Problem Relation Age of Onset  . Heart disease Mother   . Hypertension Mother   . Sudden death Mother     Presumably cardiac  . Hyperlipidemia Father   . Heart attack Father 84    At least 5 MIs. Had CABG.  . Heart failure Father   . Diabetes Father   . Hypertension Father   . Kidney disease Father   . Diabetes Brother   . Lung cancer Maternal Grandmother   . Diabetes Paternal Grandmother   . Sudden death Paternal Grandmother     Unclear etiology  . Diabetes    . Kidney disease    . Kidney disease Brother   . Hypertension Brother   . Sudden death Brother     Thought to be related to heart disease  . Throat cancer Brother   . Colon cancer Neg Hx    Social History:  reports that he has quit smoking. His smoking use included Cigarettes. He has a 35.00 pack-year smoking history. He has never used smokeless tobacco. He reports that he uses drugs, including Oxycodone and Cocaine. He reports that he does not drink alcohol. Allergies:  Allergies  Allergen Reactions  . Zolpidem Tartrate Other (See Comments)    Hallucinations and sleep walks  . Demerol [Meperidine] Itching, Rash and Hives   Medications Prior to Admission  Medication Sig Dispense Refill  . aspirin EC 81 MG tablet Take 81 mg by mouth daily.    .  busPIRone (BUSPAR) 10 MG tablet Take 10 mg by mouth 2 (two) times daily.    Marland Kitchen esomeprazole (NEXIUM) 40 MG capsule Take 40 mg by mouth daily at 12 noon.    . gabapentin (NEURONTIN) 300 MG capsule Take 600 mg by mouth 3 (three) times daily.     . hydrOXYzine (ATARAX/VISTARIL) 25 MG tablet Take 50 mg by mouth 3 (three) times daily.     Marland Kitchen lamoTRIgine (LAMICTAL) 25 MG tablet Take 1 tablet (25 mg total) by mouth daily. (Patient taking differently: Take 50 mg by mouth every evening. ) 30 tablet 0  . levothyroxine (SYNTHROID, LEVOTHROID) 75 MCG tablet Take 75 mcg by mouth daily before breakfast.    . metoprolol tartrate (LOPRESSOR) 25 MG tablet Take 12.5 mg by mouth 2 (two) times daily.    . nitroGLYCERIN (NITROSTAT) 0.4 MG SL tablet Place 0.4 mg under the tongue every 5 (five) hours as needed for chest pain.     Marland Kitchen OLANZapine (ZYPREXA) 7.5 MG tablet Take 7.5 mg by mouth at bedtime.    Marland Kitchen oxyCODONE (  ROXICODONE) 15 MG immediate release tablet Take 15 mg by mouth 3 (three) times daily.    . tamsulosin (FLOMAX) 0.4 MG CAPS capsule Take 0.4 mg by mouth daily.    Marland Kitchen tiZANidine (ZANAFLEX) 2 MG tablet Take 4 mg by mouth every 12 (twelve) hours.     . topiramate (TOPAMAX) 100 MG tablet Take 100 mg by mouth at bedtime.    . Vitamin D, Ergocalciferol, (DRISDOL) 50000 units CAPS capsule Take 50,000 Units by mouth every 7 (seven) days.    . Oxycodone HCl 10 MG TABS Every 4-6 hours PRN for post-surgical pain (Patient not taking: Reported on 08/24/2016) 40 tablet 0    Home: Home Living Family/patient expects to be discharged to:: Private residence Living Arrangements: Alone Available Help at Discharge: Personal care attendant Type of Home: Apartment Home Access: Level entry Home Layout: One level Bathroom Shower/Tub: Chiropodist: Handicapped height Bathroom Accessibility: Yes Home Equipment: Environmental consultant - 2 wheels, [place] - single point Additional Comments: ex-wife and daughter help  intermittently. PCA 3 days/ week for 1.5 hrs, helps with cooking and cleaning, ADL's as needed  Functional History: Prior Function Level of Independence: Needs assistance Gait / Transfers Assistance Needed: ambulates with cane since tibial plateau fx few years ago ADL's / Homemaking Assistance Needed: assist for home mgmt Functional Status:  Mobility: Bed Mobility Overal bed mobility: Needs Assistance Bed Mobility: Supine to Sit Supine to sit: +2 for physical assistance, Mod assist General bed mobility comments: hand over hand guidance to initiate mobility. Mod A to roll left and legs off bed. Mod A for trunk elevation Transfers Overall transfer level: Needs assistance Equipment used: 2 person hand held assist Transfers: Sit to/from Stand, Stand Pivot Transfers Sit to Stand: Mod assist, +2 physical assistance Stand pivot transfers: Mod assist, +2 physical assistance General transfer comment: mod A +2 to steady and small boost for power up. Pt able to take pivot steps to recliner and sat for short period. Did not realize his femoral cath was temporary so remained sitting 10 mins and was then assisted back to bed by nursing.       ADL:    Cognition: Cognition Overall Cognitive Status: Impaired/Different from baseline Orientation Level: Oriented to person, Oriented to place, Disoriented to time, Disoriented to situation Cognition Arousal/Alertness: Lethargic Behavior During Therapy: Flat affect Overall Cognitive Status: Impaired/Different from baseline Area of Impairment: Problem solving Problem Solving: Difficulty sequencing, Requires verbal cues General Comments: pt with right gaze preference throughout session, facial asymmetry noted, followed commands consistently, had difficulty sequencing mobility tasks   Blood pressure (!) 151/91, pulse 88, temperature 98.8 F (37.1 C), temperature source Oral, resp. rate 17, height 6' (1.829 m), weight 112.6 kg (248 lb 3.8 oz), SpO2 95  %. Physical Exam  Vitals reviewed. HENT:  Head: Normocephalic.  Eyes: EOM are normal.  Neck: Normal range of motion. Neck supple. No thyromegaly present.  Cardiovascular: Normal rate and regular rhythm.   Respiratory:  Decreased breath sounds at the bases with limited inspiratory effort  GI: Soft. Bowel sounds are normal. He exhibits no distension.  Neurological:  Lethargic but arousable. He will make eye contact with examiner. Voice is of low tone able to provide his name and age. Follow basic commands  Skin: Skin is warm and dry.  Motor exam- 5/5 bilateral deltoid, bicep, triceps, grip, 4/5. Bilateral hip flexor, knee extensor and dorsiflexor. Negative straight leg raising. Sensation intact light touch bilateral upper lower limbs  Results for orders placed  or performed during the hospital encounter of 08/23/16 (from the past 24 hour(s))  Troponin I (q 6hr x 3)     Status: None   Collection Time: 08/28/16  8:34 PM  Result Value Ref Range   Troponin I <0.03 <0.03 ng/mL  Renal function panel     Status: Abnormal   Collection Time: 08/29/16  2:15 AM  Result Value Ref Range   Sodium 135 135 - 145 mmol/L   Potassium 3.8 3.5 - 5.1 mmol/L   Chloride 96 (L) 101 - 111 mmol/L   CO2 25 22 - 32 mmol/L   Glucose, Bld 100 (H) 65 - 99 mg/dL   BUN 80 (H) 6 - 20 mg/dL   Creatinine, Ser 8.94 (H) 0.61 - 1.24 mg/dL   Calcium 8.6 (L) 8.9 - 10.3 mg/dL   Phosphorus 7.4 (H) 2.5 - 4.6 mg/dL   Albumin 2.3 (L) 3.5 - 5.0 g/dL   GFR calc non Af Amer 6 (L) >60 mL/min   GFR calc Af Amer 7 (L) >60 mL/min   Anion gap 14 5 - 15  CBC     Status: Abnormal   Collection Time: 08/29/16  2:15 AM  Result Value Ref Range   WBC 10.4 4.0 - 10.5 K/uL   RBC 3.99 (L) 4.22 - 5.81 MIL/uL   Hemoglobin 11.3 (L) 13.0 - 17.0 g/dL   HCT 32.8 (L) 39.0 - 52.0 %   MCV 82.2 78.0 - 100.0 fL   MCH 28.3 26.0 - 34.0 pg   MCHC 34.5 30.0 - 36.0 g/dL   RDW 12.5 11.5 - 15.5 %   Platelets 220 150 - 400 K/uL  Magnesium     Status:  None   Collection Time: 08/29/16  2:15 AM  Result Value Ref Range   Magnesium 2.4 1.7 - 2.4 mg/dL  Troponin I (q 6hr x 3)     Status: None   Collection Time: 08/29/16  2:15 AM  Result Value Ref Range   Troponin I <0.03 <0.03 ng/mL  Troponin I (q 6hr x 3)     Status: None   Collection Time: 08/29/16  7:57 AM  Result Value Ref Range   Troponin I <0.03 <0.03 ng/mL   Dg Chest Port 1 View  Result Date: 08/28/2016 CLINICAL DATA:  Post left IJ dialysis catheter placement EXAM: PORTABLE CHEST 1 VIEW COMPARISON:  08/27/2016 FINDINGS: Left dialysis catheter placement. The tip is in the SVC. Right internal jugular central line tip in the upper SVC, unchanged. Spinal stimulator wires project over the mid thoracic spine. Low lung volumes with right base atelectasis. Improving aeration at the left base. No effusions. No acute bony abnormality. IMPRESSION: Right base atelectasis. Interval placement of left internal jugular dialysis catheter with the tip in the SVC. No pneumothorax. Electronically Signed   By: Rolm Baptise M.D.   On: 08/28/2016 11:44    Assessment/Plan: Diagnosis: Debility secondary to opiate and benzodiazepine overdose complicated by rhabdomyolysis and acute renal failure 1. Does the need for close, 24 hr/day medical supervision in concert with the patient's rehab needs make it unreasonable for this patient to be served in a less intensive setting? No 2. Co-Morbidities requiring supervision/potential complications: Bipolar disorder, history of traumatic brain injury, history of schizophrenia 3. Due to medication administration and pain management, does the patient require 24 hr/day rehab nursing? No 4. Does the patient require coordinated care of a physician, rehab nurse, PT, OT to address physical and functional deficits in the context of the above  medical diagnosis(es)? No Addressing deficits in the following areas: balance, endurance, locomotion, strength and transferring 5. Can the  patient actively participate in an intensive therapy program of at least 3 hrs of therapy per day at least 5 days per week? Potentially 6. The potential for patient to make measurable gains while on inpatient rehab is Poor since he is already at a high functional level, min guard assist 7. Anticipated functional outcomes upon discharge from inpatient rehab are n/a  with PT, n/a with OT, n/a with SLP. 8. Estimated rehab length of stay to reach the above functional goals is: Not applicable 9. Does the patient have adequate social supports and living environment to accommodate these discharge functional goals? Potentially 10. Anticipated D/C setting: Home 11. Anticipated post D/C treatments: [place] therapy 12. Overall Rehab/Functional Prognosis: fair  RECOMMENDATIONS: This patient's condition is appropriate for continued rehabilitative care in the following setting: [facility] Therapy Patient has agreed to participate in recommended program. Potentially Note that insurance prior authorization may be required for reimbursement for recommended care.  Comment: Has palliative meeting for 09/01/2016   Cathlyn Parsons., PA-C 08/29/2016

## 2016-08-29 NOTE — Progress Notes (Signed)
qPhysical Therapy Treatment Patient Details Name: [name] MRN: [id number] DOB: 1965/10/31 Today's Date: 08/29/2016    History of Present Illness Mr. Gubser is a 51 y/o man admitted in the early morning of 08/23/16 with rhabdomyolysis after an ingestion (wife thinks it was unintentional).  Over the course of his admission his renal function continued to decline and he developed worsening azotemia with altered mental status requiring dialysis.  He then developed increasing hypoxia thought to be due to fluid overload and he was emergently intubated. Extubated 08/27/16. PMH: chronic back pain, left tibial plateau fx, bipolar do, fibromyalgia, CHI x3    PT Comments    Pt making good progress with mobility. Pt requiring less physical assistance for bed mobility and transfers. Pt also tolerated ambulating a short distance (10') with RW and min guard for safety. All VSS throughout. Pt would continue to benefit from skilled physical therapy services at this time while admitted and after d/c to address the below listed limitations in order to improve overall safety and independence with functional mobility.    Follow Up Recommendations  CIR;Supervision/Assistance - 24 hour     Equipment Recommendations  None recommended by PT    Recommendations for Other Services OT consult;Rehab consult     Precautions / Restrictions Precautions Precautions: Fall Precaution Comments: has fallen since admission Restrictions Weight Bearing Restrictions: No    Mobility  Bed Mobility Overal bed mobility: Needs Assistance Bed Mobility: Supine to Sit     Supine to sit: Min guard     General bed mobility comments: increased time, use of bed rails, min guard for safety  Transfers Overall transfer level: Needs assistance Equipment used: Rolling walker (2 wheeled) Transfers: Sit to/from Stand Sit to Stand: Min assist         General transfer comment: min A for stability with transition into  standing from bed  Ambulation/Gait Ambulation/Gait assistance: Min guard Ambulation Distance (Feet): 10 Feet Assistive device: Rolling walker (2 wheeled) Gait Pattern/deviations: Step-through pattern;Decreased step length - right;Decreased step length - left;Decreased stride length;Shuffle Gait velocity: decreased Gait velocity interpretation: Below normal speed for age/gender General Gait Details: modest instability with gait using a RW but no LOB or need for physical assistance, min guard for safety   Stairs            Wheelchair Mobility    Modified Rankin (Stroke Patients Only)       Balance Overall balance assessment: Needs assistance Sitting-balance support: Feet supported Sitting balance-Leahy Scale: Fair     Standing balance support: Bilateral upper extremity supported Standing balance-Leahy Scale: Poor Standing balance comment: pt reliant on bilateral UEs on RW                            Cognition Arousal/Alertness: Awake/alert Behavior During Therapy: Flat affect Overall Cognitive Status: Within Functional Limits for tasks assessed                                        Exercises      General Comments        Pertinent Vitals/Pain Pain Assessment: No/denies pain    Home Living                      Prior Function            PT Goals (  current goals can now be found in the care plan section) Acute Rehab PT Goals PT Goal Formulation: With patient Time For Goal Achievement: 09/10/16 Potential to Achieve Goals: Good Progress towards PT goals: Progressing toward goals    Frequency    Min 3X/week      PT Plan Current plan remains appropriate    Co-evaluation             End of Session Equipment Utilized During Treatment: Gait belt;Oxygen Activity Tolerance: Patient tolerated treatment well Patient left: in chair;with call bell/phone within reach;with chair alarm set Nurse Communication:  Mobility status PT Visit Diagnosis: Unsteadiness on feet (R26.81);Muscle weakness (generalized) (M62.81)     Time: 3606-7703 PT Time Calculation (min) (ACUTE ONLY): 16 min  Charges:  $Gait Training: 8-22 mins                    G Codes:       [place], [place], [place] 08/29/2016, 3:55 PM

## 2016-08-29 NOTE — Progress Notes (Signed)
  Speech Language Pathology Treatment: Dysphagia  Patient Details Name: [name] MRN: [id number] DOB: Oct 02, 1965 Today's Date: 08/29/2016 Time: 0850-0900 SLP Time Calculation (min) (ACUTE ONLY): 10 min  Assessment / Plan / Recommendation Clinical Impression  Pt seen with am meal, tolerated all textures without signs of aspiration, adequate mastication despite missing dentition. Pts vocal quality is still quite harsh, but good breath support and lack of other co morbidities improves pts potential for rapid recovery from any acute reversible dysphagia after intubation. Recommend pt continue thin liquids and upgrade to unrestricted solids. No SLP f/u needed unless new problems arise. Please reorder if needed.   HPI HPI: Pt is a 51 y.o. male admitted in the early morning of 08/23/16 with rhabdomyolysis after an ingestion (wife thinks it was unintentional). Over the course of his admission his renal function continued to decline and he developed worsening azotemia with altered mental status requiring dialysis. He then developed increasing hypoxia thought to be due to fluid overload and he was emergently intubated. Intubated 3/25-3/28. Extubated 3/28. PMH: chronic kidney disease, hyperlipidemia, GERD, head injury, SOB, bipolar disorder, depression, and fibromyalgia.      SLP Plan  All goals met       Recommendations  Diet recommendations: Regular;Thin liquid Liquids provided via: Cup Medication Administration: Whole meds with liquid Supervision: Patient able to self feed                Plan: All goals met       GO               Travis [place], MA CCC-SLP [phone]  Lynann Beaver 08/29/2016, 9:37 AM

## 2016-08-29 NOTE — Plan of Care (Signed)
Problem: Pain Managment: Goal: General experience of comfort will improve Outcome: Progressing Pt states that his back pain is constant and the lowest his pain is on a 1-10 scale is a 2. At this time he reports that his pain is well controlled on his current pain regimen.

## 2016-08-29 NOTE — Progress Notes (Signed)
[facility] Progress Note   Subjective:  s/p HD X 3 (3/25, 3/26, 3/28) Fem cath removed/replaced with trialysis cath L IJ by CCM 3/29 Making small amounts urine Creatinine rising in interdialytic interval. No return of GFR yet  Vitals:   08/29/16 0345 08/29/16 0758 08/29/16 1056 08/29/16 1232  BP:    (!) 151/91  Pulse:    88  Resp:    17  Temp:  98.3 F (36.8 C)  98.8 F (37.1 C)  TempSrc:  Oral  Oral  SpO2:   95% 95%  Weight: 112.6 kg (248 lb 3.8 oz)     Height:        Exam: VS as noted above NAD Sclera anicteric No jvd or bruits Chest anteriorly clear Abd soft and not tender No edema of LE's L IJ trialysis catheter (3/29)    Inpatient medications: . aspirin EC  81 mg Oral Daily  . docusate  100 mg Per Tube BID  . doxycycline  100 mg Oral Q12H  . feeding supplement  1 Container Oral TID BM  . fentaNYL (SUBLIMAZE) injection  50 mcg Intravenous Once  . heparin subcutaneous  5,000 Units Subcutaneous Q8H  . ipratropium-albuterol  3 mL Nebulization TID  . levothyroxine  75 mcg Oral QAC breakfast  . mouth rinse  15 mL Mouth Rinse BID  . metoprolol  5 mg Intravenous Q6H  . nitroGLYCERIN  0.5 inch Topical Q8H  . pantoprazole  40 mg Oral Daily  . senna  1 tablet Oral QHS  . sodium chloride flush  10-40 mL Intracatheter Q12H  . sodium chloride flush  3 mL Intravenous Q12H    sodium chloride, sodium chloride, sodium chloride, sodium chloride, albuterol, alteplase, heparin, heparin, heparin, hydrocortisone cream, lidocaine (PF), lidocaine-prilocaine, nitroGLYCERIN, ondansetron **OR** ondansetron (ZOFRAN) IV, oxyCODONE, pentafluoroprop-tetrafluoroeth   Recent Labs Lab 08/27/16 0330 08/28/16 0427 08/29/16 0215  NA 139 138 135  K 4.4 3.8 3.8  CL 98* 98* 96*  CO2 23 24 25   GLUCOSE 84 73 100*  BUN 72* 56* 80*  CREATININE 9.89* 7.57* 8.94*  CALCIUM 8.3* 8.3* 8.6*  PHOS 9.1* 7.4* 7.4*    Recent Labs Lab 08/23/16 0301 08/24/16 0409   08/27/16 0330 08/28/16 0427 08/29/16 0215  AST 1,001* 494*  --   --   --   --   ALT 284* 221*  --   --   --   --   ALKPHOS 71 72  --   --   --   --   BILITOT 0.9 0.6  --   --   --   --   PROT 5.4* 5.2*  --   --   --   --   ALBUMIN 2.7* 2.5*  < > 2.1* 2.0* 2.3*  < > = values in this interval not displayed.  Recent Labs Lab 08/23/16 0301  08/27/16 0330 08/28/16 0427 08/29/16 0215  WBC 9.3  < > 8.6 8.7 10.4  NEUTROABS 7.8*  --   --   --   --   HGB 11.2*  < > 11.4* 10.7* 11.3*  HCT 32.6*  < > 33.4* 31.3* 32.8*  MCV 83.8  < > 84.1 83.5 82.2  PLT 98*  < > 205 201 220  < > = values in this interval not displayed.  Background:  51 y.o. year-old with history of bipolar d/o, schizophrenia, chronic pain who presented to outside hospital after being found down at home by his family.  Taken to  ED where labs showed high BUN/ Cr. (BUN >100, creatinine 10). (Baseline creatinine 1.12 on 02/08/16).  Asked to see for renal failure. (Has history of rhabdo, AKI, bipolar d/o, suicide attempt (2011), syncope (2005) in the past). HD initiated 08/24/16  Assessment: 1. Acute kidney injury - ATN due to severe rhabdomyolysis.  s/p HD X 3 (3/25, 3/26, 3/28). Volume status looks much better. Weight down about 7 kg since admission. Looks euvolemic. Still oliguric  Last had HD 3/28. Fem cath out, new L IJ trialysis catheter. HD today.   2. Hyperphosphatemia - add calcium acetate. Renal diet 3. Rhabdomyolysis, acute  4. Bipolar d/o w suspected drug OD (wife thinks was unintentional) 5. Schizophrenia 6. Hx suicide attempt in the past 7. Chronic pain 8. Hypertension/tachycardia Has started diet. Change IV metoprolol to po.  Jamal Maes, MD [facility] [phone] Pager 08/29/2016, 3:09 PM

## 2016-08-29 NOTE — Progress Notes (Signed)
PROGRESS NOTE    [name]  JTT:[id number]  DOB: 1965-06-07  DOA: 08/23/2016 PCP: Tyna Jaksch Outpatient Specialists:   Hospital course: 51 y.o.year-old with history of bipolar d/o, schizophrenia, chronic pain who presented to outside hospital after being found down at home by his family. Taken to ED where labs showed high BUN/ Cr. (BUN >100, creatinine 10). (Baseline creatinine 1.12 on 02/08/16).  Pt developed rhabdomyolysis and acute renal failure. (Has history of rhabdo, AKI, bipolar d/o, suicide attempt (2011), syncope (2005) in the past). HD initiated 08/24/16.  Over the course of his admission his renal function continued to decline and he developed worsening azotemia with altered mental status requiring dialysis.  He then developed increasing hypoxia thought to be due to fluid overload and he was emergently intubated.  He was seen by PCCM.  TRH assumed care 3/30.    Assessment & Plan:   Acute Renal Failure with uremia and AMS - Pt has been started on hemodialysis 08/24/16.  Pt had TLC replaced 3/29 from fem to IJ. Nephrology team following and managing.    Rhabdomyolysis - CK levels trending down.    Schizophrenia / Bipolar disorder with Drug ingestion overdose - Pt was seen by psychiatry service in consultation 3/29.  He did not meet criteria for inpatient psychiatric hospitalization and will receive outpatient resources for follow up.  Holding psych meds until rhabdo resolved and ok with nephrology.  Hypokalemia - resolved following.    Chronic back pain / opioid dependence - planning to transition to oral home pain meds and off the IV pain meds.    Chest Pain - Pt had chest pain episode early in morning.  His troponin tests have been negative.  His chest pain resolved but having more upper respiratory symptoms.   Suspected Aspiration - Pt had projectile vomiting episode yesterday and not having thick yellow sputum production. Will start antibiotics, aspiration  precautions, follow clinically.  Add nebulizer treatments.    GERD - protonix ordered for GI protection.   DVT prophylaxis: heparin Code Status: full  Family Communication: none at bedside Disposition Plan: tbd  Consultants:  nephrology  Subjective: Pt reports coughing up thick yellow secretions, chest pain is better this morning.    Objective: Vitals:   08/29/16 0013 08/29/16 0344 08/29/16 0345 08/29/16 0758  BP:  (!) 154/90    Pulse: 86 92    Resp: 13 15    Temp:  99.1 F (37.3 C)  98.3 F (36.8 C)  TempSrc:  Oral  Oral  SpO2: 94% 96%    Weight:   112.6 kg (248 lb 3.8 oz)   Height:        Intake/Output Summary (Last 24 hours) at 08/29/16 0840 Last data filed at 08/29/16 0344  Gross per 24 hour  Intake              493 ml  Output              550 ml  Net              -57 ml   Filed Weights   08/27/16 0456 08/28/16 0410 08/29/16 0345  Weight: 112.4 kg (247 lb 12.8 oz) 110.8 kg (244 lb 4.3 oz) 112.6 kg (248 lb 3.8 oz)    Exam:  General exam: awake, alert, oriented, NAD.  Respiratory system: coarse wheezing, upper airway congestion.  No increased work of breathing. Cardiovascular system: S1 & S2 heard, RRR.  Gastrointestinal system: Abdomen is nondistended, soft  and nontender. Normal bowel sounds heard. Central nervous system: Alert and oriented. No focal neurological deficits. Extremities: no cyanosis.  Data Reviewed: Basic Metabolic Panel:  Recent Labs Lab 08/25/16 0425 08/25/16 1112 08/26/16 0425 08/27/16 0330 08/28/16 0427 08/29/16 0215  NA 138  --  137 139 138 135  K 2.9* 3.5 3.4* 4.4 3.8 3.8  CL 99*  --  98* 98* 98* 96*  CO2 24  --  24 23 24 25   GLUCOSE 103*  --  91 84 73 100*  BUN 70*  --  52* 72* 56* 80*  CREATININE 8.94*  --  7.80* 9.89* 7.57* 8.94*  CALCIUM 7.5*  --  8.2* 8.3* 8.3* 8.6*  MG 2.1  --  2.3 2.4 2.3 2.4  PHOS 5.0*  --  6.2* 9.1* 7.4* 7.4*   Liver Function Tests:  Recent Labs Lab 08/23/16 0301 08/24/16 0409  08/24/16 1534 08/26/16 0425 08/27/16 0330 08/28/16 0427 08/29/16 0215  AST 1,001* 494*  --   --   --   --   --   ALT 284* 221*  --   --   --   --   --   ALKPHOS 71 72  --   --   --   --   --   BILITOT 0.9 0.6  --   --   --   --   --   PROT 5.4* 5.2*  --   --   --   --   --   ALBUMIN 2.7* 2.5* 2.6* 2.3* 2.1* 2.0* 2.3*   No results for input(s): LIPASE, AMYLASE in the last 168 hours. No results for input(s): AMMONIA in the last 168 hours. CBC:  Recent Labs Lab 08/23/16 0301  08/25/16 0425 08/26/16 0425 08/27/16 0330 08/28/16 0427 08/29/16 0215  WBC 9.3  < > 8.1 8.0 8.6 8.7 10.4  NEUTROABS 7.8*  --   --   --   --   --   --   HGB 11.2*  < > 10.2* 11.1* 11.4* 10.7* 11.3*  HCT 32.6*  < > 29.6* 32.1* 33.4* 31.3* 32.8*  MCV 83.8  < > 81.8 83.6 84.1 83.5 82.2  PLT 98*  < > 172 160 205 201 220  < > = values in this interval not displayed. Cardiac Enzymes:  Recent Labs Lab 08/23/16 0301 08/24/16 0409 08/26/16 0425 08/27/16 0330 08/28/16 0427 08/28/16 2034 08/29/16 0215  CKTOTAL >50,000* 26,910* 3,821* 1,412* 795*  --   --   TROPONINI  --   --   --   --   --  <0.03 <0.03   CBG (last 3)   Recent Labs  08/26/16 1528  GLUCAP 85   Recent Results (from the past 240 hour(s))  MRSA PCR Screening     Status: None   Collection Time: 08/23/16  1:33 AM  Result Value Ref Range Status   MRSA by PCR NEGATIVE NEGATIVE Final    Comment:        The GeneXpert MRSA Assay (FDA approved for NASAL specimens only), is one component of a comprehensive MRSA colonization surveillance program. It is not intended to diagnose MRSA infection nor to guide or monitor treatment for MRSA infections.      Studies: Dg Chest Port 1 View  Result Date: 08/28/2016 CLINICAL DATA:  Post left IJ dialysis catheter placement EXAM: PORTABLE CHEST 1 VIEW COMPARISON:  08/27/2016 FINDINGS: Left dialysis catheter placement. The tip is in the SVC. Right internal jugular central line tip in  the upper SVC,  unchanged. Spinal stimulator wires project over the mid thoracic spine. Low lung volumes with right base atelectasis. Improving aeration at the left base. No effusions. No acute bony abnormality. IMPRESSION: Right base atelectasis. Interval placement of left internal jugular dialysis catheter with the tip in the SVC. No pneumothorax. Electronically Signed   By: Rolm Baptise M.D.   On: 08/28/2016 11:44   Scheduled Meds: . aspirin EC  81 mg Oral Daily  . feeding supplement  1 Container Oral TID BM  . fentaNYL (SUBLIMAZE) injection  50 mcg Intravenous Once  . heparin subcutaneous  5,000 Units Subcutaneous Q8H  . levothyroxine  75 mcg Oral QAC breakfast  . mouth rinse  15 mL Mouth Rinse BID  . metoprolol  5 mg Intravenous Q6H  . nitroGLYCERIN  0.5 inch Topical Q8H  . pantoprazole  40 mg Oral Daily  . sodium chloride flush  10-40 mL Intracatheter Q12H  . sodium chloride flush  3 mL Intravenous Q12H   Continuous Infusions:  Principal Problem:   AKI (acute kidney injury) ([place]) Active Problems:   PAIN, CHRONIC NEC   GERD   Rhabdomyolysis   Transaminitis   Drug overdose, multiple drugs   Tachycardia   Accelerated hypertension  Critical Care Time spent: 50 mins  Irwin Brakeman, MD, FAAFP [facility] Pager [phone]  If 7PM-7AM, please contact night-coverage [email or web address] Password [email or web address] 08/29/2016, 8:40 AM    LOS: 6 days

## 2016-08-30 ENCOUNTER — Inpatient Hospital Stay (HOSPITAL_COMMUNITY): Payer: 59

## 2016-08-30 LAB — COMPREHENSIVE METABOLIC PANEL
ALK PHOS: 144 U/L — AB (ref 38–126)
ALT: 73 U/L — AB (ref 17–63)
ANION GAP: 14 (ref 5–15)
AST: 57 U/L — ABNORMAL HIGH (ref 15–41)
Albumin: 2.3 g/dL — ABNORMAL LOW (ref 3.5–5.0)
BUN: 72 mg/dL — ABNORMAL HIGH (ref 6–20)
CALCIUM: 8.6 mg/dL — AB (ref 8.9–10.3)
CO2: 25 mmol/L (ref 22–32)
CREATININE: 8.26 mg/dL — AB (ref 0.61–1.24)
Chloride: 96 mmol/L — ABNORMAL LOW (ref 101–111)
GFR, EST AFRICAN AMERICAN: 8 mL/min — AB (ref >60)
GFR, EST NON AFRICAN AMERICAN: 7 mL/min — AB (ref >60)
Glucose, Bld: 99 mg/dL (ref 65–99)
Potassium: 3.5 mmol/L (ref 3.5–5.1)
Sodium: 135 mmol/L (ref 135–145)
TOTAL PROTEIN: 5.8 g/dL — AB (ref 6.5–8.1)
Total Bilirubin: 0.7 mg/dL (ref 0.3–1.2)

## 2016-08-30 LAB — CBC
HCT: 33.2 % — ABNORMAL LOW (ref 39.0–52.0)
HEMOGLOBIN: 11.3 g/dL — AB (ref 13.0–17.0)
MCH: 28 pg (ref 26.0–34.0)
MCHC: 34 g/dL (ref 30.0–36.0)
MCV: 82.2 fL (ref 78.0–100.0)
PLATELETS: 233 10*3/uL (ref 150–400)
RBC: 4.04 MIL/uL — AB (ref 4.22–5.81)
RDW: 12.5 % (ref 11.5–15.5)
WBC: 11.2 10*3/uL — AB (ref 4.0–10.5)

## 2016-08-30 LAB — MAGNESIUM: MAGNESIUM: 2.2 mg/dL (ref 1.7–2.4)

## 2016-08-30 LAB — PHOSPHORUS: PHOSPHORUS: 6.1 mg/dL — AB (ref 2.5–4.6)

## 2016-08-30 LAB — TRIGLYCERIDES: TRIGLYCERIDES: 333 mg/dL — AB (ref <150)

## 2016-08-30 MED ORDER — SENNA 8.6 MG PO TABS: 1.0000 | ORAL_TABLET | Freq: Every evening | ORAL | Status: DC | PRN

## 2016-08-30 MED ORDER — OXYCODONE HCL 5 MG PO TABS
ORAL_TABLET | ORAL | Status: AC
  Administered 2016-08-30: 10 mg via ORAL
  Filled 2016-08-30: qty 2

## 2016-08-30 MED ORDER — HYDRALAZINE HCL 25 MG PO TABS
25.0000 mg | ORAL_TABLET | Freq: Two times a day (BID) | ORAL | Status: DC
  Administered 2016-08-30 – 2016-09-06 (×14): 25 mg via ORAL
  Filled 2016-08-30 (×15): qty 1

## 2016-08-30 MED ORDER — ALUM & MAG HYDROXIDE-SIMETH 200-200-20 MG/5ML PO SUSP
15.0000 mL | Freq: Four times a day (QID) | ORAL | Status: DC | PRN
  Administered 2016-08-31 – 2016-09-03 (×4): 15 mL via ORAL
  Filled 2016-08-30 (×5): qty 30

## 2016-08-30 MED ORDER — GUAIFENESIN-DM 100-10 MG/5ML PO SYRP: 5.0000 mL | ORAL_SOLUTION | Freq: Four times a day (QID) | ORAL | Status: DC | PRN

## 2016-08-30 NOTE — Progress Notes (Addendum)
[facility] Progress Note   Subjective:  s/p HD X 3 (3/25, 3/26, 3/28) Fem cath removed/replaced with trialysis cath L IJ by CCM 3/29 Only got 1 hour 15 minutes of his 3/30 HD due to catheter dysfunction and is to get another treatment today Making only small amounts urine  Coughing some, bringing up sputum Hoarse   Vitals:   08/30/16 0427 08/30/16 0553 08/30/16 0839 08/30/16 1029  BP: (!) 150/87  (!) 167/93 (!) 158/85  Pulse: 72 100 73 75  Resp: 16 (!) 21 13 18   Temp: 98.5 F (36.9 C)  99.2 F (37.3 C) 98.5 F (36.9 C)  TempSrc: Oral  Oral   SpO2: 96% 98% 98% 97%  Weight:  116.1 kg (255 lb 15.3 oz)    Height:        Exam: VS as noted above NAD Sl disheveled in appearance Sclera anicteric No jvd or bruits Coarse rhonchi posteriorly with exp wheezing Abd soft and not tender No edema of LE's L IJ trialysis catheter (3/29) loose dressing    Inpatient medications: . aspirin EC  81 mg Oral Daily  . calcium acetate  667 mg Oral TID WC  . docusate  100 mg Per Tube BID  . doxycycline  100 mg Oral Q12H  . feeding supplement  1 Container Oral TID BM  . heparin subcutaneous  5,000 Units Subcutaneous Q8H  . ipratropium-albuterol  3 mL Nebulization TID  . levothyroxine  75 mcg Oral QAC breakfast  . mouth rinse  15 mL Mouth Rinse BID  . metoprolol tartrate  50 mg Oral BID  . pantoprazole  40 mg Oral Daily  . sodium chloride flush  10-40 mL Intracatheter Q12H  . sodium chloride flush  3 mL Intravenous Q12H    sodium chloride, sodium chloride, sodium chloride, sodium chloride, albuterol, alteplase, guaiFENesin-dextromethorphan, heparin, heparin, heparin, hydrocortisone cream, lidocaine (PF), lidocaine-prilocaine, metoprolol, nitroGLYCERIN, ondansetron **OR** ondansetron (ZOFRAN) IV, oxyCODONE, pentafluoroprop-tetrafluoroeth, senna   Recent Labs Lab 08/28/16 0427 08/29/16 0215 08/30/16 0533  NA 138 135 135  K 3.8 3.8 3.5  CL 98* 96* 96*  CO2 24 25  25   GLUCOSE 73 100* 99  BUN 56* 80* 72*  CREATININE 7.57* 8.94* 8.26*  CALCIUM 8.3* 8.6* 8.6*  PHOS 7.4* 7.4* 6.1*    Recent Labs Lab 08/24/16 0409  08/28/16 0427 08/29/16 0215 08/30/16 0533  AST 494*  --   --   --  57*  ALT 221*  --   --   --  73*  ALKPHOS 72  --   --   --  144*  BILITOT 0.6  --   --   --  0.7  PROT 5.2*  --   --   --  5.8*  ALBUMIN 2.5*  < > 2.0* 2.3* 2.3*  < > = values in this interval not displayed.  Recent Labs Lab 08/28/16 0427 08/29/16 0215 08/30/16 0533  WBC 8.7 10.4 11.2*  HGB 10.7* 11.3* 11.3*  HCT 31.3* 32.8* 33.2*  MCV 83.5 82.2 82.2  PLT 201 220 233    Background:  51 y.o. year-old with history of bipolar d/o, schizophrenia, chronic pain who presented to outside hospital after being found down at home by his family.  Taken to ED where labs showed high BUN/ Cr. (BUN >100, creatinine 10). (Baseline creatinine 1.12 on 02/08/16).  Asked to see for renal failure. (Has history of rhabdo, AKI, bipolar d/o, suicide attempt (2011), syncope (2005) in the past). HD initiated 08/24/16  Assessment: 1. Acute kidney injury - ATN due to severe rhabdomyolysis.  s/p HD X 3 (3/25, 3/26, 3/28). L IJ trialysis catheter placed 3/29 (fem cath pulled). Last full HD 3/28. HD could not be completed  3/30 PM d/t catheter malfx. Had TPA in catheter and is to go back today for a full treatment 2. Hyperphosphatemia - added calcium acetate. Renal diet 3. Rhabdomyolysis, acute  4. Bipolar d/o w suspected drug OD (wife thinks was unintentional) 5. Schizophrenia 6. Hx suicide attempt in the past 7. Chronic pain 8. Hypertension/tachycardia metoprolol changed IV->po. HR controlled. Add hydralazine,  Jamal Maes, MD [place] Pager 08/30/2016, 2:27 PM

## 2016-08-30 NOTE — Progress Notes (Signed)
PROGRESS NOTE    [name]  EXH:[id number]  DOB: 1965/10/12  DOA: 08/23/2016 PCP: [facility] Outpatient Specialists:   Hospital course: 51 y.o.year-old with history of bipolar d/o, schizophrenia, chronic pain who presented to outside hospital after being found down at home by his family. Taken to ED where labs showed high BUN/ Cr. (BUN >100, creatinine 10). (Baseline creatinine 1.12 on 02/08/16).  Pt developed rhabdomyolysis and acute renal failure. (Has history of rhabdo, AKI, bipolar d/o, suicide attempt (2011), syncope (2005) in the past). HD initiated 08/24/16.  Over the course of his admission his renal function continued to decline and he developed worsening azotemia with altered mental status requiring dialysis.  He then developed increasing hypoxia thought to be due to fluid overload and he was emergently intubated.  He was seen by PCCM.  TRH assumed care 3/30.    Assessment & Plan:   ATN with Acute Renal Failure with uremia and AMS - Pt has been started on hemodialysis 08/24/16.  Pt had TLC replaced 3/29 from fem to IJ. Nephrology team following and managing.  Creatinine slow to improve.  Following.   Rhabdomyolysis - CK levels trending down.    Schizophrenia / Bipolar disorder with Drug ingestion overdose - Pt was seen by psychiatry service in consultation 3/29.  He did not meet criteria for inpatient psychiatric hospitalization and will receive outpatient resources for follow up.  Holding psych meds until rhabdo resolved and ok with nephrology.  Hypokalemia - resolved following.    Chronic back pain / opioid dependence - continue oral pain meds as needed.     Chronic constipation - Pt had large BM yesterday. Continue stool softeners.   Chest Pain - Resolved now.  His troponin tests have been negative.  His chest pain resolved but having more upper respiratory symptoms which is being treated.   Suspected Aspiration - Pt had projectile vomiting episode yesterday and  not having thick yellow sputum production. Will start antibiotics, aspiration precautions, follow clinically.  Add nebulizer treatments.   Leukocytosis - suspect related to aspiration pneumonia, check portable cxr.     GERD - protonix ordered for GI protection.   DVT prophylaxis: heparin Code Status: full  Family Communication: wife at bedside Disposition Plan: transfer to tele  Consultants:  nephrology  Subjective: Pt reports coughing a lot but overall better.      Objective: Vitals:   08/29/16 2357 08/30/16 0000 08/30/16 0427 08/30/16 0553  BP:  (!) 154/98 (!) 150/87   Pulse:  92 72 100  Resp:  17 16 (!) 21  Temp:  98.8 F (37.1 C) 98.5 F (36.9 C)   TempSrc:  Oral Oral   SpO2: 97% 95% 96% 98%  Weight:    116.1 kg (255 lb 15.3 oz)  Height:        Intake/Output Summary (Last 24 hours) at 08/30/16 0823 Last data filed at 08/30/16 0645  Gross per 24 hour  Intake              240 ml  Output             -500 ml  Net              740 ml   Filed Weights   08/29/16 2010 08/29/16 2225 08/30/16 0553  Weight: 112.8 kg (248 lb 10.9 oz) 113.5 kg (250 lb 3.6 oz) 116.1 kg (255 lb 15.3 oz)    Exam:  General exam: awake, alert, oriented, NAD.  Respiratory system:  coarse wheezing, upper airway congestion.  No increased work of breathing. Cardiovascular system: S1 & S2 heard, RRR.  Gastrointestinal system: Abdomen is nondistended, soft and nontender. Normal bowel sounds heard. Central nervous system: Alert and oriented. No focal neurological deficits. Extremities: no cyanosis.  Data Reviewed: Basic Metabolic Panel:  Recent Labs Lab 08/26/16 0425 08/27/16 0330 08/28/16 0427 08/29/16 0215 08/30/16 0533  NA 137 139 138 135 135  K 3.4* 4.4 3.8 3.8 3.5  CL 98* 98* 98* 96* 96*  CO2 24 23 24 25 25   GLUCOSE 91 84 73 100* 99  BUN 52* 72* 56* 80* 72*  CREATININE 7.80* 9.89* 7.57* 8.94* 8.26*  CALCIUM 8.2* 8.3* 8.3* 8.6* 8.6*  MG 2.3 2.4 2.3 2.4 2.2  PHOS 6.2* 9.1* 7.4*  7.4* 6.1*   Liver Function Tests:  Recent Labs Lab 08/24/16 0409  08/26/16 0425 08/27/16 0330 08/28/16 0427 08/29/16 0215 08/30/16 0533  AST 494*  --   --   --   --   --  57*  ALT 221*  --   --   --   --   --  73*  ALKPHOS 72  --   --   --   --   --  144*  BILITOT 0.6  --   --   --   --   --  0.7  PROT 5.2*  --   --   --   --   --  5.8*  ALBUMIN 2.5*  < > 2.3* 2.1* 2.0* 2.3* 2.3*  < > = values in this interval not displayed. No results for input(s): LIPASE, AMYLASE in the last 168 hours. No results for input(s): AMMONIA in the last 168 hours. CBC:  Recent Labs Lab 08/26/16 0425 08/27/16 0330 08/28/16 0427 08/29/16 0215 08/30/16 0533  WBC 8.0 8.6 8.7 10.4 11.2*  HGB 11.1* 11.4* 10.7* 11.3* 11.3*  HCT 32.1* 33.4* 31.3* 32.8* 33.2*  MCV 83.6 84.1 83.5 82.2 82.2  PLT 160 205 201 220 233   Cardiac Enzymes:  Recent Labs Lab 08/24/16 0409 08/26/16 0425 08/27/16 0330 08/28/16 0427 08/28/16 2034 08/29/16 0215 08/29/16 0757  CKTOTAL 26,910* 3,821* 1,412* 795*  --   --   --   TROPONINI  --   --   --   --  <0.03 <0.03 <0.03   CBG (last 3)  No results for input(s): GLUCAP in the last 72 hours. Recent Results (from the past 240 hour(s))  MRSA PCR Screening     Status: None   Collection Time: 08/23/16  1:33 AM  Result Value Ref Range Status   MRSA by PCR NEGATIVE NEGATIVE Final    Comment:        The GeneXpert MRSA Assay (FDA approved for NASAL specimens only), is one component of a comprehensive MRSA colonization surveillance program. It is not intended to diagnose MRSA infection nor to guide or monitor treatment for MRSA infections.      Studies: Dg Chest Port 1 View  Result Date: 08/28/2016 CLINICAL DATA:  Post left IJ dialysis catheter placement EXAM: PORTABLE CHEST 1 VIEW COMPARISON:  08/27/2016 FINDINGS: Left dialysis catheter placement. The tip is in the SVC. Right internal jugular central line tip in the upper SVC, unchanged. Spinal stimulator wires  project over the mid thoracic spine. Low lung volumes with right base atelectasis. Improving aeration at the left base. No effusions. No acute bony abnormality. IMPRESSION: Right base atelectasis. Interval placement of left internal jugular dialysis catheter with the tip in  the SVC. No pneumothorax. Electronically Signed   By: Rolm Baptise M.D.   On: 08/28/2016 11:44   Scheduled Meds: . alteplase      . aspirin EC  81 mg Oral Daily  . calcium acetate  667 mg Oral TID WC  . docusate  100 mg Per Tube BID  . doxycycline  100 mg Oral Q12H  . feeding supplement  1 Container Oral TID BM  . fentaNYL (SUBLIMAZE) injection  50 mcg Intravenous Once  . heparin subcutaneous  5,000 Units Subcutaneous Q8H  . ipratropium-albuterol  3 mL Nebulization TID  . levothyroxine  75 mcg Oral QAC breakfast  . mouth rinse  15 mL Mouth Rinse BID  . metoprolol tartrate  50 mg Oral BID  . pantoprazole  40 mg Oral Daily  . senna  1 tablet Oral QHS  . sodium chloride flush  10-40 mL Intracatheter Q12H  . sodium chloride flush  3 mL Intravenous Q12H   Continuous Infusions:  Principal Problem:   AKI (acute kidney injury) ([place]) Active Problems:   PAIN, CHRONIC NEC   GERD   Rhabdomyolysis   Transaminitis   Drug overdose, multiple drugs   Tachycardia   Accelerated hypertension  Critical Care Time spent: 72 mins  Irwin Brakeman, MD, FAAFP [facility] Pager [phone]  If 7PM-7AM, please contact night-coverage [email or web address] Password [email or web address] 08/30/2016, 8:23 AM    LOS: 7 days

## 2016-08-30 NOTE — Progress Notes (Signed)
Patient complained of burning and irritation from foley catheter. Paged Dr. Wynetta Emery about the necessity of leaving catheter in. Received orders to Boyd/C foley. [name]

## 2016-08-30 NOTE — Progress Notes (Addendum)
Pt transferred from 4E. Alert and oriented X4, no pain. IV in Right arm NSL. Hemcath in [place]. Placed box 1 on tele running NSR. 2L O2 via nasal cannula. Foley catheter in place. Oriented to room and call bell system. Will continue to monitor. Elias Else D

## 2016-08-31 LAB — RENAL FUNCTION PANEL
ALBUMIN: 2.4 g/dL — AB (ref 3.5–5.0)
ANION GAP: 12 (ref 5–15)
BUN: 45 mg/dL — ABNORMAL HIGH (ref 6–20)
CALCIUM: 8.5 mg/dL — AB (ref 8.9–10.3)
CO2: 25 mmol/L (ref 22–32)
CREATININE: 5.88 mg/dL — AB (ref 0.61–1.24)
Chloride: 99 mmol/L — ABNORMAL LOW (ref 101–111)
GFR, EST AFRICAN AMERICAN: 12 mL/min — AB (ref >60)
GFR, EST NON AFRICAN AMERICAN: 10 mL/min — AB (ref >60)
Glucose, Bld: 104 mg/dL — ABNORMAL HIGH (ref 65–99)
PHOSPHORUS: 4 mg/dL (ref 2.5–4.6)
Potassium: 3.8 mmol/L (ref 3.5–5.1)
SODIUM: 136 mmol/L (ref 135–145)

## 2016-08-31 LAB — CBC
HCT: 31.9 % — ABNORMAL LOW (ref 39.0–52.0)
HEMOGLOBIN: 10.7 g/dL — AB (ref 13.0–17.0)
MCH: 27.7 pg (ref 26.0–34.0)
MCHC: 33.5 g/dL (ref 30.0–36.0)
MCV: 82.6 fL (ref 78.0–100.0)
PLATELETS: 213 10*3/uL (ref 150–400)
RBC: 3.86 MIL/uL — AB (ref 4.22–5.81)
RDW: 12.8 % (ref 11.5–15.5)
WBC: 10.2 10*3/uL (ref 4.0–10.5)

## 2016-08-31 LAB — CK: CK TOTAL: 214 U/L (ref 49–397)

## 2016-08-31 MED ORDER — HYDROXYZINE HCL 25 MG PO TABS
25.0000 mg | ORAL_TABLET | Freq: Three times a day (TID) | ORAL | Status: DC | PRN
  Administered 2016-09-01 – 2016-09-09 (×23): 25 mg via ORAL
  Filled 2016-08-31 (×24): qty 1

## 2016-08-31 MED ORDER — SODIUM CHLORIDE 0.9% FLUSH
10.0000 mL | INTRAVENOUS | Status: DC | PRN
  Administered 2016-08-31 – 2016-09-03 (×3): 10 mL
  Filled 2016-08-31 (×2): qty 40

## 2016-08-31 MED ORDER — SODIUM CHLORIDE 0.9% FLUSH
10.0000 mL | Freq: Two times a day (BID) | INTRAVENOUS | Status: DC
  Administered 2016-09-06: 10 mL

## 2016-08-31 MED ORDER — OLANZAPINE 7.5 MG PO TABS
7.5000 mg | ORAL_TABLET | Freq: Every day | ORAL | Status: DC
  Administered 2016-08-31 – 2016-09-08 (×9): 7.5 mg via ORAL
  Filled 2016-08-31 (×10): qty 1

## 2016-08-31 NOTE — Progress Notes (Signed)
[facility] Progress Note   Subjective:  s/p HD X 3 (3/25, 3/26, 3/28, 3/31) Fem cath removed/replaced with trialysis cath L IJ by CCM 3/29 Catheter poor fx on 3/30, and on 3/31 ran but at low BFR's  Appears UOP has picked up last 24 hours  Coughing (after possible aspiration on 3/30 Sputum thick and purulent Getting nebs  Asking about gettnig back on his mood stabilizer    Vitals:   08/30/16 2221 08/31/16 0418 08/31/16 0516 08/31/16 0935  BP: (!) 160/83  (!) 144/85   Pulse: 77  85   Resp: 18  18   Temp: 98.1 F (36.7 C)  98.2 F (36.8 C)   TempSrc: Oral  Oral   SpO2: 100%  96% 96%  Weight:  110.2 kg (242 lb 14.4 oz)    Height:       I/O last 3 completed shifts: In: 1900 [P.O.:1900] Out: 1750 [Urine:1250; Other:500] Total I/O In: 190 [P.O.:180; I.V.:10] Out: -    Physical Exam: VS as noted NAD Lying flat Sl disheveled in appearance Sclera anicteric No jvd or bruits Coarse rhonchi posteriorly with exp wheezing bilaterally Abd soft and not tender Some pedal and hand edema, but no pitting edema of LE's L IJ trialysis catheter (3/29) in place    Inpatient medications: . aspirin EC  81 mg Oral Daily  . calcium acetate  667 mg Oral TID WC  . docusate  100 mg Per Tube BID  . doxycycline  100 mg Oral Q12H  . feeding supplement  1 Container Oral TID BM  . heparin subcutaneous  5,000 Units Subcutaneous Q8H  . hydrALAZINE  25 mg Oral BID  . ipratropium-albuterol  3 mL Nebulization TID  . levothyroxine  75 mcg Oral QAC breakfast  . mouth rinse  15 mL Mouth Rinse BID  . metoprolol tartrate  50 mg Oral BID  . OLANZapine  7.5 mg Oral QHS  . pantoprazole  40 mg Oral Daily  . sodium chloride flush  10-40 mL Intracatheter Q12H  . sodium chloride flush  10-40 mL Intracatheter Q12H  . sodium chloride flush  3 mL Intravenous Q12H    sodium chloride, sodium chloride, sodium chloride, sodium chloride, albuterol, alum & mag hydroxide-simeth,  guaiFENesin-dextromethorphan, heparin, heparin, heparin, hydrocortisone cream, hydrOXYzine, lidocaine (PF), lidocaine-prilocaine, metoprolol, nitroGLYCERIN, ondansetron **OR** ondansetron (ZOFRAN) IV, oxyCODONE, pentafluoroprop-tetrafluoroeth, senna, sodium chloride flush   Recent Labs Lab 08/29/16 0215 08/30/16 0533 08/31/16 0505  NA 135 135 136  K 3.8 3.5 3.8  CL 96* 96* 99*  CO2 25 25 25   GLUCOSE 100* 99 104*  BUN 80* 72* 45*  CREATININE 8.94* 8.26* 5.88*  CALCIUM 8.6* 8.6* 8.5*  PHOS 7.4* 6.1* 4.0    Recent Labs Lab 08/29/16 0215 08/30/16 0533 08/31/16 0505  AST  --  57*  --   ALT  --  73*  --   ALKPHOS  --  144*  --   BILITOT  --  0.7  --   PROT  --  5.8*  --   ALBUMIN 2.3* 2.3* 2.4*    Recent Labs Lab 08/29/16 0215 08/30/16 0533 08/31/16 0505  WBC 10.4 11.2* 10.2  HGB 11.3* 11.3* 10.7*  HCT 32.8* 33.2* 31.9*  MCV 82.2 82.2 82.6  PLT 220 233 213    Background:  51 y.o. year-old with history of bipolar d/o, schizophrenia, chronic pain who presented to outside hospital after being found down at home by his family.  Taken to ED where labs  showed high BUN/ Cr. (BUN >100, creatinine 10). (Baseline creatinine 1.12 on 02/08/16).  Asked to see for renal failure. (Has history of rhabdo, AKI, bipolar d/o, suicide attempt (2011), syncope (2005) in the past). HD initiated 08/24/16  Assessment: 1. AKI - ATN 2/2 severe rhabdomyolysis. s/p HD X 3 (3/25, 3/26, 3/28, 3/31). (L IJ trialysis catheter placed 3/29) (fem cath pulled). Last TMT 3/31 catheter ran at low BFR's but appears by labs got a reasonable treatment. UOP appears to be picking up. Re-evaluate dialysis need in AM 2. Hyperphosphatemia - added calcium acetate. Renal diet. Improved.  3. Rhabdomyolysis, acute  4. Possible aspiration PNA - followed episode vomiting 3/30. Getting doxy/nebs. Sputum purulent. CXR today  R base atelectasis 5. Bipolar d/o w suspected drug OD (wife thinks was  unintentional) 6. Schizophrenia 7. Hx suicide attempt in the past 8. Chronic pain/opiod dependence 9. Hypertension/tachycardia metoprolol  IV->po. HR controlled. Added hydralazine, improved  Jamal Maes, MD [facility] [phone] Pager 08/31/2016, 11:16 AM

## 2016-08-31 NOTE — Progress Notes (Signed)
PROGRESS NOTE    [name]  KDX:[id number]  DOB: 04-Sep-1965  DOA: 08/23/2016 PCP: [facility] Outpatient Specialists:  Hospital course: 51 y.o.year-old with history of bipolar d/o, schizophrenia, chronic pain who presented to outside hospital after being found down at home by his family. Taken to ED where labs showed high BUN/ Cr. (BUN >100, creatinine 10). (Baseline creatinine 1.12 on 02/08/16).  Pt developed rhabdomyolysis and acute renal failure. (Has history of rhabdo, AKI, bipolar d/o, suicide attempt (2011), syncope (2005) in the past). HD initiated 08/24/16.  Over the course of his admission his renal function continued to decline and he developed worsening azotemia with altered mental status requiring dialysis.  He then developed increasing hypoxia thought to be due to fluid overload and he was emergently intubated.  He was seen by PCCM.  TRH assumed care 3/30.    Assessment & Plan:   ATN with Acute Renal Failure with uremia and AMS - Pt has been started on hemodialysis 08/24/16.  Pt had TLC replaced 3/29 from fem to IJ. Nephrology team following and managing.  Creatinine slow to improve.  Following.   Rhabdomyolysis - CK levels trending down.    Schizophrenia / Bipolar disorder with Drug ingestion overdose - Pt was seen by psychiatry service in consultation 3/29.  He did not meet criteria for inpatient psychiatric hospitalization and will receive outpatient resources for follow up.  His psychiatric meds have been on hold since admission but now he is starting to have psychiatric symptoms with increasing anxiety and agitation. Will resume olanzapine QHS and hydroxyzine PRN anxiety.  He needs to get back on his mood stabilizer as soon as possible but because of his renal function it is likely not safe to restart it at this time. Reassess renal function tomorrow.  Will ask pharmacist to evaluate safety of his other psychiatric medications.    Hypokalemia - resolved following.      Chronic back pain / opioid dependence - pain is controlled on current regimen.  Continue oral pain meds as needed.     Chronic constipation - Pt had large BM this morning. Continue stool softeners.   Chest Pain - Resolved now.  His troponin tests have been negative.  His chest pain resolved but having more upper respiratory symptoms which is being treated.   Suspected Aspiration - Pt had projectile vomiting episode yesterday and not having thick yellow sputum production. Continue doxycycline, aspiration precautions, follow clinically.  Added nebulizer treatments with good results.   Leukocytosis - WBC trending down now, Suspect related to aspiration pneumonia, portable CXR showed atelectasis, encouraged incentive spirometry.       GERD - protonix ordered for GI protection.   DVT prophylaxis: heparin Code Status: full  Family Communication: wife at bedside Disposition Plan: transfer to tele  Consultants:  Nephrology  psychiatry  Subjective: Pt reports that he is starting to have more psychiatric symptoms with increasing anxiety and some hallucinations and wants to get back on his psychiatric meds as soon as possible, says he had a large BM this morning.  Not coughing as much but still productive with yellow blood tinged sputum.      Objective: Vitals:   08/30/16 2221 08/31/16 0418 08/31/16 0516 08/31/16 0935  BP: (!) 160/83  (!) 144/85   Pulse: 77  85   Resp: 18  18   Temp: 98.1 F (36.7 C)  98.2 F (36.8 C)   TempSrc: Oral  Oral   SpO2: 100%  96% 96%  Weight:  110.2 kg (242 lb 14.4 oz)    Height:        Intake/Output Summary (Last 24 hours) at 08/31/16 1036 Last data filed at 08/31/16 1021  Gross per 24 hour  Intake             1610 ml  Output             1600 ml  Net               10 ml   Filed Weights   08/30/16 1725 08/30/16 2130 08/31/16 0418  Weight: 112.2 kg (247 lb 5.7 oz) 111 kg (244 lb 11.4 oz) 110.2 kg (242 lb 14.4 oz)   Exam:  General exam: awake,  alert, oriented, NAD.  Respiratory system: clear to auscultation (just had a neb tx).  No increased work of breathing. Cardiovascular system: S1 & S2 heard, RRR.  Gastrointestinal system: Abdomen is nondistended, soft and nontender. Normal bowel sounds heard. Central nervous system: Alert and oriented. No focal neurological deficits. Extremities: no cyanosis.  Data Reviewed: Basic Metabolic Panel:  Recent Labs Lab 08/26/16 0425 08/27/16 0330 08/28/16 0427 08/29/16 0215 08/30/16 0533 08/31/16 0505  NA 137 139 138 135 135 136  K 3.4* 4.4 3.8 3.8 3.5 3.8  CL 98* 98* 98* 96* 96* 99*  CO2 24 23 24 25 25 25   GLUCOSE 91 84 73 100* 99 104*  BUN 52* 72* 56* 80* 72* 45*  CREATININE 7.80* 9.89* 7.57* 8.94* 8.26* 5.88*  CALCIUM 8.2* 8.3* 8.3* 8.6* 8.6* 8.5*  MG 2.3 2.4 2.3 2.4 2.2  --   PHOS 6.2* 9.1* 7.4* 7.4* 6.1* 4.0   Liver Function Tests:  Recent Labs Lab 08/27/16 0330 08/28/16 0427 08/29/16 0215 08/30/16 0533 08/31/16 0505  AST  --   --   --  57*  --   ALT  --   --   --  73*  --   ALKPHOS  --   --   --  144*  --   BILITOT  --   --   --  0.7  --   PROT  --   --   --  5.8*  --   ALBUMIN 2.1* 2.0* 2.3* 2.3* 2.4*   No results for input(s): LIPASE, AMYLASE in the last 168 hours. No results for input(s): AMMONIA in the last 168 hours. CBC:  Recent Labs Lab 08/27/16 0330 08/28/16 0427 08/29/16 0215 08/30/16 0533 08/31/16 0505  WBC 8.6 8.7 10.4 11.2* 10.2  HGB 11.4* 10.7* 11.3* 11.3* 10.7*  HCT 33.4* 31.3* 32.8* 33.2* 31.9*  MCV 84.1 83.5 82.2 82.2 82.6  PLT 205 201 220 233 213   Cardiac Enzymes:  Recent Labs Lab 08/26/16 0425 08/27/16 0330 08/28/16 0427 08/28/16 2034 08/29/16 0215 08/29/16 0757 08/31/16 0505  CKTOTAL 3,821* 1,412* 795*  --   --   --  214  TROPONINI  --   --   --  <0.03 <0.03 <0.03  --    CBG (last 3)  No results for input(s): GLUCAP in the last 72 hours. Recent Results (from the past 240 hour(s))  MRSA PCR Screening     Status: None     Collection Time: 08/23/16  1:33 AM  Result Value Ref Range Status   MRSA by PCR NEGATIVE NEGATIVE Final    Comment:        The GeneXpert MRSA Assay (FDA approved for NASAL specimens only), is one component of a comprehensive MRSA colonization surveillance  program. It is not intended to diagnose MRSA infection nor to guide or monitor treatment for MRSA infections.     Studies: Dg Chest Port 1 View  Result Date: 08/30/2016 CLINICAL DATA:  Cough, leukocytosis. Pt c/o SOB. Hx CKD, former smoker. EXAM: PORTABLE CHEST 1 VIEW COMPARISON:  08/28/2016 FINDINGS: There is persistent right lung base opacity again most likely atelectasis. Lung volumes are relatively low, more noted on the right where there is elevation of the right hemidiaphragm. Remainder of the lungs is clear. Heart, mediastinum and hila are unremarkable. No convincing pleural effusion.  No pneumothorax. Right internal jugular central venous line has been removed. Left internal jugular central venous line is stable. IMPRESSION: 1. Persistent right lung base atelectasis. No new lung abnormalities. Electronically Signed   By: Lajean Manes M.D.   On: 08/30/2016 09:40   Scheduled Meds: . aspirin EC  81 mg Oral Daily  . calcium acetate  667 mg Oral TID WC  . docusate  100 mg Per Tube BID  . doxycycline  100 mg Oral Q12H  . feeding supplement  1 Container Oral TID BM  . heparin subcutaneous  5,000 Units Subcutaneous Q8H  . hydrALAZINE  25 mg Oral BID  . ipratropium-albuterol  3 mL Nebulization TID  . levothyroxine  75 mcg Oral QAC breakfast  . mouth rinse  15 mL Mouth Rinse BID  . metoprolol tartrate  50 mg Oral BID  . OLANZapine  7.5 mg Oral QHS  . pantoprazole  40 mg Oral Daily  . sodium chloride flush  10-40 mL Intracatheter Q12H  . sodium chloride flush  10-40 mL Intracatheter Q12H  . sodium chloride flush  3 mL Intravenous Q12H   Continuous Infusions:  Principal Problem:   AKI (acute kidney injury) ([place]) Active  Problems:   PAIN, CHRONIC NEC   GERD   Rhabdomyolysis   Transaminitis   Drug overdose, multiple drugs   Tachycardia   Accelerated hypertension  Time spent: 24 mins  Irwin Brakeman, MD, FAAFP [facility] Pager [phone]  If 7PM-7AM, please contact night-coverage [email or web address] Password [email or web address] 08/31/2016, 10:36 AM    LOS: 8 days

## 2016-09-01 LAB — RENAL FUNCTION PANEL
Albumin: 2.4 g/dL — ABNORMAL LOW (ref 3.5–5.0)
Anion gap: 11 (ref 5–15)
BUN: 59 mg/dL — AB (ref 6–20)
CALCIUM: 8.6 mg/dL — AB (ref 8.9–10.3)
CHLORIDE: 97 mmol/L — AB (ref 101–111)
CO2: 26 mmol/L (ref 22–32)
Creatinine, Ser: 7.46 mg/dL — ABNORMAL HIGH (ref 0.61–1.24)
GFR, EST AFRICAN AMERICAN: 9 mL/min — AB (ref >60)
GFR, EST NON AFRICAN AMERICAN: 8 mL/min — AB (ref >60)
Glucose, Bld: 102 mg/dL — ABNORMAL HIGH (ref 65–99)
PHOSPHORUS: 4.7 mg/dL — AB (ref 2.5–4.6)
POTASSIUM: 4.1 mmol/L (ref 3.5–5.1)
Sodium: 134 mmol/L — ABNORMAL LOW (ref 135–145)

## 2016-09-01 MED ORDER — RENA-VITE PO TABS
1.0000 | ORAL_TABLET | Freq: Every day | ORAL | Status: DC
  Administered 2016-09-01 – 2016-09-08 (×8): 1 via ORAL
  Filled 2016-09-01 (×8): qty 1

## 2016-09-01 MED ORDER — AMLODIPINE BESYLATE 5 MG PO TABS
5.0000 mg | ORAL_TABLET | Freq: Every day | ORAL | Status: DC
  Administered 2016-09-01 – 2016-09-06 (×5): 5 mg via ORAL
  Filled 2016-09-01 (×6): qty 1

## 2016-09-01 MED ORDER — IPRATROPIUM-ALBUTEROL 0.5-2.5 (3) MG/3ML IN SOLN
3.0000 mL | Freq: Two times a day (BID) | RESPIRATORY_TRACT | Status: DC
  Administered 2016-09-01 – 2016-09-04 (×5): 3 mL via RESPIRATORY_TRACT
  Filled 2016-09-01 (×6): qty 3

## 2016-09-01 NOTE — Progress Notes (Signed)
Subjective: Interval History: has no complaint.  Objective: Vital signs in last 24 hours: Temp:  [98 F (36.7 C)-98.9 F (37.2 C)] 98.9 F (37.2 C) (04/02 0459) Pulse Rate:  [74-89] 89 (04/02 0459) Resp:  [16-18] 18 (04/02 0459) BP: (133-171)/(76-86) 158/86 (04/02 0459) SpO2:  [96 %-99 %] 98 % (04/02 0843) Weight:  [110.9 kg (244 lb 8 oz)] 110.9 kg (244 lb 8 oz) (04/02 0409) Weight change: -1.296 kg (-2 lb 13.7 oz)  Intake/Output from previous day: 04/01 0701 - 04/02 0700 In: 592 [P.O.:582; I.V.:10] Out: 1075 [Urine:1075] Intake/Output this shift: Total I/O In: -  Out: 300 [Urine:300]  General appearance: alert, cooperative, no distress, pale and slowed mentation Neck: LIJ cath Resp: diminished breath sounds bilaterally Cardio: S1, S2 normal and systolic murmur: systolic ejection 2/6, decrescendo at 2nd left intercostal space GI: obese, pos bs, liver down 5 cm Extremities: edema 2+  Lab Results:  Recent Labs  08/30/16 0533 08/31/16 0505  WBC 11.2* 10.2  HGB 11.3* 10.7*  HCT 33.2* 31.9*  PLT 233 213   BMET:  Recent Labs  08/31/16 0505 09/01/16 0458  NA 136 134*  K 3.8 4.1  CL 99* 97*  CO2 25 26  GLUCOSE 104* 102*  BUN 45* 59*  CREATININE 5.88* 7.46*  CALCIUM 8.5* 8.6*   No results for input(s): PTH in the last 72 hours. Iron Studies: No results for input(s): IRON, TIBC, TRANSFERRIN, FERRITIN in the last 72 hours.  Studies/Results: No results found.  I have reviewed the patient's current medications.  Assessment/Plan: 1 AKI rhabdo  Nonoliguric . Maybe some early GFR recovery.  Hold off HD 2 Anemia  3 HTN cont current meds 4 Bipolar 5 Schizo  6 Drug OD 7 Substance abuse P hold off Hd, main issue psych    LOS: 9 days   [name] 09/01/2016,10:57 AM

## 2016-09-01 NOTE — Progress Notes (Addendum)
PROGRESS NOTE    [name]  EHM:[id number]  DOB: [birth year]  DOA: 08/23/2016 PCP: Isaias Cowman, PA-C Outpatient Specialists:  Brief Admission Hx: 51 y.o.year-old with history of bipolar d/o, schizophrenia, chronic pain who presented to outside hospital after being found down at home by his family. Taken to ED where labs showed high BUN/ Cr. (BUN >100, creatinine 10). (Baseline creatinine 1.12 on 02/08/16).  Pt developed rhabdomyolysis and acute renal failure. (Has history of rhabdo, AKI, bipolar d/o, suicide attempt (2011), syncope (2005) in the past). HD initiated 08/24/16.  Over the course of his admission his renal function continued to decline and he developed worsening azotemia with altered mental status requiring dialysis.  He then developed increasing hypoxia thought to be due to fluid overload and he was emergently intubated.  He was seen by PCCM.  TRH assumed care 3/30.    Assessment & Plan:   ATN with Acute Renal Failure with uremia and AMS - Pt started on hemodialysis 08/24/16.  Pt had TLC replaced 3/29 from fem to IJ. Nephrology team following and managing.  Creatinine slow to improve. No HD today per nephrologist.   Rhabdomyolysis - CK levels trending down.  Resolved now but significantly damaged patient's kidney function.   Schizophrenia / Bipolar disorder with Drug ingestion overdose - Pt was seen by psychiatry service in consultation 3/29.  He did not meet criteria for inpatient psychiatric hospitalization and will receive outpatient resources for follow up.  His psychiatric meds have been on hold since admission but now he is starting to have psychiatric symptoms with increasing anxiety and agitation. Resumed olanzapine 7.5 mg QHS and hydroxyzine PRN anxiety on 4/1.  He needs to get back on his other mood stabilizers as soon as possible but because of his renal function it is likely not safe to restart it at this time. Reassess renal function and restart them as it is safe to  do so. Assess mental health daily and reconsult psychiatry if needed.    Hypokalemia - repleted, following.     Chronic back pain / opioid dependence - pain is controlled on current regimen.  Continue oral pain meds as needed.     Chronic constipation - Pt had large BM last 2 days. Continue scheduled stool softeners.   Chest Pain - Resolved now.  His troponin tests have been negative.  His chest pain resolved but having more upper respiratory symptoms which is being treated.   Aspiration - Pt had projectile vomiting episode 3/30 and now having thick yellow sputum production. It is improving now and xray is clear.  Continue doxycycline, aspiration precautions, follow clinically.  Added nebulizer treatments with good results.   Leukocytosis - WBC trending down now, Suspect related to aspiration pneumonia, portable CXR showed atelectasis, encouraged incentive spirometry.       GERD - protonix ordered for GI protection.   Essential hypertension - still suboptimally controlled, add amlodipine 5 mg on 4/2.      DVT prophylaxis: heparin Code Status: full  Family Communication: wife at bedside Disposition Plan: Home with [facility] services  Consultants:  Nephrology  psychiatry  Subjective: Pt reports that he making more urine and no further constipation.       ROS: as noted in history otherwise all reviewed negative.   Objective: Vitals:   08/31/16 2226 09/01/16 0409 09/01/16 0459 09/01/16 0843  BP: (!) 171/77  (!) 158/86   Pulse: 89  89   Resp: 18  18   Temp: 98.2 F (36.8  C)  98.9 F (37.2 C)   TempSrc:   Oral   SpO2: 99%  99% 98%  Weight:  110.9 kg (244 lb 8 oz)    Height:        Intake/Output Summary (Last 24 hours) at 09/01/16 1414 Last data filed at 09/01/16 1252  Gross per 24 hour  Intake              282 ml  Output             1325 ml  Net            -1043 ml   Filed Weights   08/30/16 2130 08/31/16 0418 09/01/16 0409  Weight: 111 kg (244 lb 11.4 oz) 110.2 kg (242  lb 14.4 oz) 110.9 kg (244 lb 8 oz)   Exam:  General exam: awake, alert, oriented, NAD.  Respiratory system: clear to auscultation.  No increased work of breathing. Cardiovascular system: S1 & S2 heard, RRR.  Gastrointestinal system: Abdomen is nondistended, soft and nontender. Normal bowel sounds heard. Central nervous system: Alert and oriented. No focal neurological deficits. Extremities: no cyanosis.  Data Reviewed: Basic Metabolic Panel:  Recent Labs Lab 08/26/16 0425 08/27/16 0330 08/28/16 0427 08/29/16 0215 08/30/16 0533 08/31/16 0505 09/01/16 0458  NA 137 139 138 135 135 136 134*  K 3.4* 4.4 3.8 3.8 3.5 3.8 4.1  CL 98* 98* 98* 96* 96* 99* 97*  CO2 24 23 24 25 25 25 26   GLUCOSE 91 84 73 100* 99 104* 102*  BUN 52* 72* 56* 80* 72* 45* 59*  CREATININE 7.80* 9.89* 7.57* 8.94* 8.26* 5.88* 7.46*  CALCIUM 8.2* 8.3* 8.3* 8.6* 8.6* 8.5* 8.6*  MG 2.3 2.4 2.3 2.4 2.2  --   --   PHOS 6.2* 9.1* 7.4* 7.4* 6.1* 4.0 4.7*   Liver Function Tests:  Recent Labs Lab 08/28/16 0427 08/29/16 0215 08/30/16 0533 08/31/16 0505 09/01/16 0458  AST  --   --  57*  --   --   ALT  --   --  73*  --   --   ALKPHOS  --   --  144*  --   --   BILITOT  --   --  0.7  --   --   PROT  --   --  5.8*  --   --   ALBUMIN 2.0* 2.3* 2.3* 2.4* 2.4*   No results for input(s): LIPASE, AMYLASE in the last 168 hours. No results for input(s): AMMONIA in the last 168 hours. CBC:  Recent Labs Lab 08/27/16 0330 08/28/16 0427 08/29/16 0215 08/30/16 0533 08/31/16 0505  WBC 8.6 8.7 10.4 11.2* 10.2  HGB 11.4* 10.7* 11.3* 11.3* 10.7*  HCT 33.4* 31.3* 32.8* 33.2* 31.9*  MCV 84.1 83.5 82.2 82.2 82.6  PLT 205 201 220 233 213   Cardiac Enzymes:  Recent Labs Lab 08/26/16 0425 08/27/16 0330 08/28/16 0427 08/28/16 2034 08/29/16 0215 08/29/16 0757 08/31/16 0505  CKTOTAL 3,821* 1,412* 795*  --   --   --  214  TROPONINI  --   --   --  <0.03 <0.03 <0.03  --    CBG (last 3)  No results for input(s):  GLUCAP in the last 72 hours. Recent Results (from the past 240 hour(s))  MRSA PCR Screening     Status: None   Collection Time: 08/23/16  1:33 AM  Result Value Ref Range Status   MRSA by PCR NEGATIVE NEGATIVE Final    Comment:  The GeneXpert MRSA Assay (FDA approved for NASAL specimens only), is one component of a comprehensive MRSA colonization surveillance program. It is not intended to diagnose MRSA infection nor to guide or monitor treatment for MRSA infections.     Studies: No results found. Scheduled Meds: . aspirin EC  81 mg Oral Daily  . calcium acetate  667 mg Oral TID WC  . docusate  100 mg Per Tube BID  . doxycycline  100 mg Oral Q12H  . feeding supplement  1 Container Oral TID BM  . heparin subcutaneous  5,000 Units Subcutaneous Q8H  . hydrALAZINE  25 mg Oral BID  . ipratropium-albuterol  3 mL Nebulization BID  . levothyroxine  75 mcg Oral QAC breakfast  . mouth rinse  15 mL Mouth Rinse BID  . metoprolol tartrate  50 mg Oral BID  . multivitamin  1 tablet Oral QHS  . OLANZapine  7.5 mg Oral QHS  . pantoprazole  40 mg Oral Daily  . sodium chloride flush  10-40 mL Intracatheter Q12H  . sodium chloride flush  10-40 mL Intracatheter Q12H  . sodium chloride flush  3 mL Intravenous Q12H   Continuous Infusions:  Principal Problem:   AKI (acute kidney injury) ([place]) Active Problems:   PAIN, CHRONIC NEC   GERD   Rhabdomyolysis   Transaminitis   Drug overdose, multiple drugs   Tachycardia   Accelerated hypertension  Time spent: 22 mins  Irwin Brakeman, MD, FAAFP [facility] Pager [phone]  If 7PM-7AM, please contact night-coverage [email or web address] Password [email or web address] 09/01/2016, 2:14 PM    LOS: 9 days

## 2016-09-01 NOTE — Progress Notes (Signed)
qPhysical Therapy Treatment Patient Details Name: [name] MRN: [id number] DOB: 1965-12-01 Today's Date: 09/01/2016    History of Present Illness [name] is a 51 y/o man admitted in the early morning of 08/23/16 with rhabdomyolysis after an ingestion (wife thinks it was unintentional).  Over the course of his admission his renal function continued to decline and he developed worsening azotemia with altered mental status requiring dialysis.  He then developed increasing hypoxia thought to be due to fluid overload and he was emergently intubated. Extubated 08/27/16. PMH: chronic back pain, left tibial plateau fx, bipolar do, fibromyalgia, CHI x3    PT Comments    Pt performed increased activity but remains to fatigue with noticeable signs of DOE.  Pt tolerated increased gait but remains to require constant cues for safety.  As patient fatigues he requires more cueing with RW use.  Pt remains a risk for fall at this time and will continue to benefit from rehab in a post acute setting.      Follow Up Recommendations  CIR;Supervision/Assistance - 24 hour     Equipment Recommendations  None recommended by PT    Recommendations for Other Services OT consult;Rehab consult     Precautions / Restrictions Precautions Precautions: Fall Precaution Comments: has fallen since admission Restrictions Weight Bearing Restrictions: No    Mobility  Bed Mobility Overal bed mobility: Needs Assistance Bed Mobility: Supine to Sit     Supine to sit: Modified independent (Device/Increase time)     General bed mobility comments: No assistance needed.  Able to bend down and pull up his socks unassisted.    Transfers Overall transfer level: Needs assistance Equipment used: Rolling walker (2 wheeled) Transfers: Sit to/from Stand Sit to Stand: Supervision Stand pivot transfers: Supervision       General transfer comment: Cues for hand placement to and from seated surface.  Pt able to follow  commands for safety with transfers.    Ambulation/Gait Ambulation/Gait assistance: Min guard Ambulation Distance (Feet): 100 Feet (x3) Assistive device: Rolling walker (2 wheeled) Gait Pattern/deviations: Step-through pattern;Decreased step length - right;Decreased step length - left;Decreased stride length;Shuffle;Trunk flexed Gait velocity: decreased Gait velocity interpretation: Below normal speed for age/gender General Gait Details: Pt remains to fatigue quickly with mobility.  Flexed posture observed with cues for upper trunk control throughout tx.  Pt O2 sats on 2L 98% HR 101.  DOE 2/4.     Stairs            Wheelchair Mobility    Modified Rankin (Stroke Patients Only)       Balance     Sitting balance-Leahy Scale: Good       Standing balance-Leahy Scale: Poor                              Cognition Arousal/Alertness: Awake/alert Behavior During Therapy: Flat affect;WFL for tasks assessed/performed Overall Cognitive Status: Within Functional Limits for tasks assessed                                        Exercises      General Comments        Pertinent Vitals/Pain Pain Assessment: No/denies pain Faces Pain Scale: Hurts little more Pain Location: left knee (from old injury) Pain Descriptors / Indicators: Aching Pain Intervention(s): Monitored during session;Repositioned    Home Living  Prior Function            PT Goals (current goals can now be found in the care plan section) Acute Rehab PT Goals Patient Stated Goal: return home Potential to Achieve Goals: Good Progress towards PT goals: Progressing toward goals    Frequency    Min 3X/week      PT Plan Current plan remains appropriate    Co-evaluation             End of Session Equipment Utilized During Treatment: Gait belt;Oxygen Activity Tolerance: Patient tolerated treatment well Patient left: in chair;with call  bell/phone within reach;with chair alarm set Nurse Communication: Mobility status PT Visit Diagnosis: Unsteadiness on feet (R26.81);Muscle weakness (generalized) (M62.81)     Time: 7903-8333 PT Time Calculation (min) (ACUTE ONLY): 16 min  Charges:  $Gait Training: 8-22 mins                    G Codes:       [name], PTA pager [phone]    [name] 09/01/2016, 4:52 PM

## 2016-09-01 NOTE — Evaluation (Signed)
Occupational Therapy Evaluation Patient Details Name: [name] MRN: [id number] DOB: [birth year] Today's Date: 09/01/2016    History of Present Illness [name] is a 51 y/o man admitted in the early morning of 08/23/16 with rhabdomyolysis after an ingestion (wife thinks it was unintentional).  Over the course of his admission his renal function continued to decline and he developed worsening azotemia with altered mental status requiring dialysis.  He then developed increasing hypoxia thought to be due to fluid overload and he was emergently intubated. Extubated 08/27/16. PMH: chronic back pain, left tibial plateau fx, bipolar do, fibromyalgia, CHI x3   Clinical Impression   Patient presenting with decreased I in self care, functional balance, activity tolerance, strength, and safety awareness.  Patient had aide for 1.5 hrs/5x wk that performed IADL tasks and supervision for ADLS PTA. Patient currently functioning at overall min A level. Patient will benefit from acute OT to increase overall independence in the areas of ADLs, functional mobility, safety awareness in order to safely discharge to next venue of care. Pt denies having hallucinations at this time.      Follow Up Recommendations  CIR as pt has decreased caregiver support and lives alone. He has aide that assists with IADL tasks and provides supervision only for ADLs at baseline.    Equipment Recommendations  Other (comment) (TBD at next venue of care)    Recommendations for Other Services       Precautions / Restrictions Precautions Precautions: Fall Precaution Comments: has fallen since admission Restrictions Weight Bearing Restrictions: No      Mobility Bed Mobility Overal bed mobility: Needs Assistance Bed Mobility: Supine to Sit     Supine to sit: Min guard     General bed mobility comments: increased time, use of bed rails, min guard for safety  Transfers Overall transfer level: Needs assistance Equipment  used: Rolling walker (2 wheeled) Transfers: Sit to/from Stand Sit to Stand: Min assist Stand pivot transfers: Min assist       General transfer comment: min A for stability with transition into standing from bed    Balance Overall balance assessment: Needs assistance Sitting-balance support: Feet supported Sitting balance-Leahy Scale: Fair     Standing balance support: Bilateral upper extremity supported Standing balance-Leahy Scale: Poor Standing balance comment: pt reliant on bilateral UEs on RW         ADL either performed or assessed with clinical judgement   ADL Overall ADL's : Needs assistance/impaired     Grooming: Wash/dry hands;Wash/dry face;Min guard;Set up;Supervision/safety;Standing      Functional mobility during ADLs: Min guard;Rolling walker General ADL Comments: Pt currently requires steady assistance for balance during standing ADL tasks. Pt needing multiple rest breaks secondary to fatigue.     Vision Baseline Vision/History: No visual deficits Patient Visual Report: No change from baseline              Pertinent Vitals/Pain Pain Assessment: No/denies pain     Hand Dominance Right   Extremity/Trunk Assessment Upper Extremity Assessment Upper Extremity Assessment: Generalized weakness   Lower Extremity Assessment Lower Extremity Assessment: Defer to PT evaluation   Cervical / Trunk Assessment Cervical / Trunk Assessment: Normal   Communication Communication Communication: No difficulties   Cognition Arousal/Alertness: Awake/alert Behavior During Therapy: Flat affect Overall Cognitive Status: Within Functional Limits for tasks assessed                     Home Living Family/patient expects to be discharged to::  Private residence Living Arrangements: Alone Available Help at Discharge: Personal care attendant Type of Home: Apartment Home Access: Level entry     [place]: One level     Bathroom Shower/Tub: Arts administrator: Handicapped height Bathroom Accessibility: Yes How Accessible: Accessible via walker Home Equipment: [place] - 2 wheels;Crutches;Cane - single point;Tub bench   Additional Comments: ex-wife and daughter help intermittently. PCA 3 days/ week for 1.5 hrs, helps with cooking and cleaning,and Supervision for ADL's as needed      Prior Functioning/Environment Level of Independence: Needs assistance  Gait / Transfers Assistance Needed: ambulates with cane since tibial plateau fx few years ago ADL's / Homemaking Assistance Needed: assist for home mgmt            OT Problem List: Decreased strength;Decreased activity tolerance;Impaired balance (sitting and/or standing);Decreased safety awareness      OT Treatment/Interventions: Self-care/ADL training;Therapeutic exercise;Energy conservation;DME and/or AE instruction;Cognitive remediation/compensation;Therapeutic activities    OT Goals(Current goals can be found in the care plan section) Acute Rehab OT Goals Patient Stated Goal: return home OT Goal Formulation: With patient Time For Goal Achievement: 09/15/16 Potential to Achieve Goals: Good ADL Goals Pt Will Perform Grooming: with modified independence Pt Will Perform Upper Body Bathing: with supervision Pt Will Perform Lower Body Bathing: with supervision Pt Will Perform Upper Body Dressing: with supervision Pt Will Perform Lower Body Dressing: with supervision Pt Will Transfer to Toilet: with supervision Pt Will Perform Toileting - Clothing Manipulation and hygiene: with supervision Pt Will Perform Tub/Shower Transfer: with supervision;rolling walker;ambulating;tub bench  OT Frequency: Min 2X/week   Barriers to D/C: Decreased caregiver support  no family available to provide supervision or assist at discharge          End of Session Equipment Utilized During Treatment: Rolling walker;Oxygen  Activity Tolerance: Patient limited by fatigue Patient  left: in chair;with call bell/phone within reach;with chair alarm set                   Time: 1212-1230 OT Time Calculation (min): 18 min Charges:  OT General Charges $OT Visit: 1 Procedure OT Evaluation $OT Eval Moderate Complexity: 1 Procedure G-Codes:       Gypsy Decant , MS, OTR/L, CBIS 09/01/2016, 12:43 PM

## 2016-09-02 LAB — CBC
HCT: 30.3 % — ABNORMAL LOW (ref 39.0–52.0)
Hemoglobin: 10.4 g/dL — ABNORMAL LOW (ref 13.0–17.0)
MCH: 28.3 pg (ref 26.0–34.0)
MCHC: 34.3 g/dL (ref 30.0–36.0)
MCV: 82.3 fL (ref 78.0–100.0)
PLATELETS: 290 10*3/uL (ref 150–400)
RBC: 3.68 MIL/uL — AB (ref 4.22–5.81)
RDW: 13.3 % (ref 11.5–15.5)
WBC: 10.3 10*3/uL (ref 4.0–10.5)

## 2016-09-02 LAB — RENAL FUNCTION PANEL
Albumin: 2.3 g/dL — ABNORMAL LOW (ref 3.5–5.0)
Anion gap: 15 (ref 5–15)
BUN: 74 mg/dL — AB (ref 6–20)
CALCIUM: 8.7 mg/dL — AB (ref 8.9–10.3)
CHLORIDE: 94 mmol/L — AB (ref 101–111)
CO2: 25 mmol/L (ref 22–32)
CREATININE: 8.41 mg/dL — AB (ref 0.61–1.24)
GFR, EST AFRICAN AMERICAN: 8 mL/min — AB (ref >60)
GFR, EST NON AFRICAN AMERICAN: 7 mL/min — AB (ref >60)
Glucose, Bld: 85 mg/dL (ref 65–99)
Phosphorus: 6.1 mg/dL — ABNORMAL HIGH (ref 2.5–4.6)
Potassium: 4 mmol/L (ref 3.5–5.1)
SODIUM: 134 mmol/L — AB (ref 135–145)

## 2016-09-02 MED ORDER — PENTAFLUOROPROP-TETRAFLUOROETH EX AERO: 1.0000 "application " | INHALATION_SPRAY | CUTANEOUS | Status: DC | PRN

## 2016-09-02 MED ORDER — SODIUM CHLORIDE 0.9 % IV SOLN: 100.0000 mL | INTRAVENOUS | Status: DC | PRN

## 2016-09-02 MED ORDER — METOPROLOL TARTRATE 5 MG/5ML IV SOLN
INTRAVENOUS | Status: AC
  Filled 2016-09-02: qty 5

## 2016-09-02 MED ORDER — LIDOCAINE HCL (PF) 1 % IJ SOLN: 5.0000 mL | INTRAMUSCULAR | Status: DC | PRN

## 2016-09-02 MED ORDER — ALTEPLASE 2 MG IJ SOLR
2.0000 mg | Freq: Once | INTRAMUSCULAR | Status: DC | PRN
Start: 2016-09-02 — End: 2016-09-02

## 2016-09-02 MED ORDER — LIDOCAINE-PRILOCAINE 2.5-2.5 % EX CREA: 1.0000 "application " | TOPICAL_CREAM | CUTANEOUS | Status: DC | PRN

## 2016-09-02 MED ORDER — HEPARIN SODIUM (PORCINE) 1000 UNIT/ML DIALYSIS: 1000.0000 [IU] | INTRAMUSCULAR | Status: DC | PRN

## 2016-09-02 MED ORDER — NEPRO/CARBSTEADY PO LIQD
237.0000 mL | Freq: Two times a day (BID) | ORAL | Status: DC
  Administered 2016-09-02 – 2016-09-08 (×13): 237 mL via ORAL

## 2016-09-02 MED ORDER — HEPARIN SODIUM (PORCINE) 1000 UNIT/ML DIALYSIS
100.0000 [IU]/kg | INTRAMUSCULAR | Status: DC | PRN
  Administered 2016-09-02: 11100 [IU] via INTRAVENOUS_CENTRAL
  Filled 2016-09-02 (×2): qty 12

## 2016-09-02 NOTE — Progress Notes (Signed)
Subjective: Interval History: has complaints abdm distended a bit.  Objective: Vital signs in last 24 hours: Temp:  [98.1 F (36.7 C)-99.2 F (37.3 C)] 98.1 F (36.7 C) (04/03 0529) Pulse Rate:  [79-85] 79 (04/03 0529) Resp:  [17-18] 18 (04/03 0529) BP: (135-158)/(65-90) 135/76 (04/03 0529) SpO2:  [96 %-99 %] 96 % (04/03 0757) Weight:  [110.8 kg (244 lb 4.3 oz)] 110.8 kg (244 lb 4.3 oz) (04/03 0653) Weight change: -0.105 kg (-3.7 oz)  Intake/Output from previous day: 04/02 0701 - 04/03 0700 In: 247 [P.O.:237; I.V.:10] Out: 1305 [Urine:1305] Intake/Output this shift: No intake/output data recorded.  General appearance: alert, cooperative, no distress and pale Neck: L Ij cath Resp: diminished breath sounds bilaterally Cardio: S1, S2 normal and systolic murmur: holosystolic 2/6, blowing at apex GI: obese, pos bs, liver down 4 cm, soft Extremities: edema 1+  Lab Results:  Recent Labs  08/31/16 0505  WBC 10.2  HGB 10.7*  HCT 31.9*  PLT 213   BMET:  Recent Labs  09/01/16 0458 09/02/16 0357  NA 134* 134*  K 4.1 4.0  CL 97* 94*  CO2 26 25  GLUCOSE 102* 85  BUN 59* 74*  CREATININE 7.46* 8.41*  CALCIUM 8.6* 8.7*   No results for input(s): PTH in the last 72 hours. Iron Studies: No results for input(s): IRON, TIBC, TRANSFERRIN, FERRITIN in the last 72 hours.  Studies/Results: No results found.  I have reviewed the patient's current medications.  Assessment/Plan: 1 AKI rhabdo  Solute^^, will do HD. NOn oliguric.  Should recover soon 2 Anemia 3 Drug OD 4 bipolar 5 Schizo P HD, follow chem    LOS: 10 days   Daveda Larock L 09/02/2016,12:22 PM

## 2016-09-02 NOTE — Progress Notes (Signed)
qPhysical Therapy Treatment Patient Details Name: [name] MRN: [id number] DOB: [birth year] Today's Date: 09/02/2016    History of Present Illness [name] is a 51 y/o man admitted in the early morning of 08/23/16 with rhabdomyolysis after an ingestion (wife thinks it was unintentional).  Over the course of his admission his renal function continued to decline and he developed worsening azotemia with altered mental status requiring dialysis.  He then developed increasing hypoxia thought to be due to fluid overload and he was emergently intubated. Extubated 08/27/16. PMH: chronic back pain, left tibial plateau fx, bipolar do, fibromyalgia, CHI x3    PT Comments    Patient making good gains with mobility and gait.  Recommend HHPT for continued therapy at d/c.   Follow Up Recommendations  Home health PT;Supervision - Intermittent     Equipment Recommendations  None recommended by PT    Recommendations for Other Services       Precautions / Restrictions Precautions Precautions: Fall Restrictions Weight Bearing Restrictions: No    Mobility  Bed Mobility Overal bed mobility: Modified Independent Bed Mobility: Supine to Sit;Sit to Supine     Supine to sit: Modified independent (Device/Increase time) Sit to supine: Modified independent (Device/Increase time)   General bed mobility comments: No assist needed.  Transfers Overall transfer level: Needs assistance Equipment used: Rolling walker (2 wheeled) Transfers: Sit to/from Stand Sit to Stand: Modified independent (Device/Increase time)         General transfer comment: Uses correct hand placement.  No assist needed from bed or chair.  Steady in stance with RW.  Ambulation/Gait Ambulation/Gait assistance: Supervision Ambulation Distance (Feet): 280 Feet Assistive device: Rolling walker (2 wheeled) Gait Pattern/deviations: Step-through pattern;Decreased stride length;Trunk flexed Gait velocity: decreased Gait  velocity interpretation: Below normal speed for age/gender General Gait Details: Verbal cues to remain upright.  Patient demonstrates safe use of RW.  Patient ambulated on room air with O2 sats at 100% during entire gait.  Patient with dyspnea of 3/4 at end of walk.  Able to recover without use of O2.   Stairs            Wheelchair Mobility    Modified Rankin (Stroke Patients Only)       Balance             Standing balance-Leahy Scale: Poor Standing balance comment: pt reliant on bilateral UEs on RW                            Cognition Arousal/Alertness: Awake/alert Behavior During Therapy: Flat affect;WFL for tasks assessed/performed Overall Cognitive Status: Within Functional Limits for tasks assessed                                        Exercises      General Comments        Pertinent Vitals/Pain Pain Assessment: Faces Faces Pain Scale: Hurts a little bit Pain Location: back pain, chronic Pain Descriptors / Indicators: Aching Pain Intervention(s): Monitored during session    Home Living                      Prior Function            PT Goals (current goals can now be found in the care plan section) Acute Rehab PT Goals Patient Stated Goal: return  home Progress towards PT goals: Progressing toward goals    Frequency    Min 3X/week      PT Plan Discharge plan needs to be updated    Co-evaluation             End of Session Equipment Utilized During Treatment: Gait belt Activity Tolerance: Patient tolerated treatment well Patient left: in bed;with call bell/phone within reach;with bed alarm set Nurse Communication: Mobility status (O2 sats at 100% during gait) PT Visit Diagnosis: Unsteadiness on feet (R26.81);Muscle weakness (generalized) (M62.81)     Time: 4627-0350 PT Time Calculation (min) (ACUTE ONLY): 11 min  Charges:  $Gait Training: 8-22 mins                    G Codes:        Carita Pian. Sanjuana Kava, [facility] Acute Rehab Services Pager [place] 09/02/2016, 1:11 PM

## 2016-09-02 NOTE — Consult Note (Addendum)
   [facility] CM Inpatient Consult   09/02/2016  [name] [birth year] [id number]    Came to visit [name] on behalf of [place] [employer] program for [place] employees/dependents with [facility] insurance. His wife is a Furniture conservator/restorer. Discussed Link to [employer]. He states he has had high cholesterol but can not take the "regular" statins due to his kidney issues. States he and his Primary Care Provider have been working on getting authorization for a different type of statin that is an injection. States he is supposed to follow up with Primary Care for paperwork for authorization.  Discussed that he will receive post hospital discharge call. Confirmed best contact number as [phone]. Provided contact information and Link to Wellness brochure at bedside. Appreciative of visit. Made inpatient RNCM aware of bedside encounter.    Marthenia Rolling, MSN-Ed, RN,BSN [facility] Liaison [phone]

## 2016-09-02 NOTE — Progress Notes (Signed)
Rehab admissions - Please see consult done by Dr. Letta Pate recommending [facility] therapies.  Patient is doing well with therapies and will not need an acute inpatient rehab admission.  Call me for questions.  #[phone]

## 2016-09-02 NOTE — Progress Notes (Signed)
Nutrition Follow-up  DOCUMENTATION CODES:   Obesity unspecified  INTERVENTION:   -D/c Boost Breeze po TID, each supplement provides 250 kcal and 9 grams of protein -Nepro Shake po BID, each supplement provides 425 kcal and 19 grams protein  NUTRITION DIAGNOSIS:   Inadequate oral intake related to  (missing teeth) as evidenced by  (per pt report).  Progressing  GOAL:   Patient will meet greater than or equal to 90% of their needs  Progressing  MONITOR:   Supplement acceptance, PO intake  REASON FOR ASSESSMENT:   Ventilator    ASSESSMENT:   51 y/o man with rhabdo 2/2 overdose now with azotemia resulting in altered mental status and fluid overload with hypoxic and hypercarbic respiratory failure  Per nephrology notes, HD was held on 09/01/16.   Pt working with physical therapy at time of visit.   Case discussed with RN, who reports appetite is improving. Meal completion 25-100%. Pt does not like Boost Breeze supplements and has been refusing them. RN reports that she has been substituting with Nepro shakes, which pt likes. RD will amend order.   Per RN, plan for HD today.   Labs reviewed: Na: 134, Phos: 6.1.   Diet Order:  Diet renal with fluid restriction Fluid restriction: 1200 mL Fluid; Room service appropriate? Yes; Fluid consistency: Thin  Skin:  Reviewed, no issues  Last BM:  09/01/16  Height:   Ht Readings from Last 1 Encounters:  08/28/16 6' (1.829 m)    Weight:   Wt Readings from Last 1 Encounters:  09/02/16 244 lb 4.3 oz (110.8 kg)    Ideal Body Weight:  80.9 kg  BMI:  Body mass index is 33.13 kg/m.  Estimated Nutritional Needs:   Kcal:  2100-2300  Protein:  100-120 grams  Fluid:  > 2 L/day  EDUCATION NEEDS:   No education needs identified at this time  Saba Gomm A. Jimmye Norman, RD, LDN, CDE Pager: [phone] After hours Pager: [phone]

## 2016-09-02 NOTE — Progress Notes (Signed)
PROGRESS NOTE    [name]  KCL:[id number]  DOB: 1965-11-01  DOA: 08/23/2016 PCP: Isaias Cowman, PA-C Outpatient Specialists:  Brief Admission Hx: 51 y.o.year-old with history of bipolar d/o, schizophrenia, chronic pain who presented to outside hospital after being found down at home by his family. Taken to ED where labs showed high BUN/ Cr. (BUN >100, creatinine 10). (Baseline creatinine 1.12 on 02/08/16).  Pt developed rhabdomyolysis and acute renal failure. (Has history of rhabdo, AKI, bipolar d/o, suicide attempt (2011), syncope (2005) in the past). HD initiated 08/24/16.  Over the course of his admission his renal function continued to decline and he developed worsening azotemia with altered mental status requiring dialysis.  He then developed increasing hypoxia thought to be due to fluid overload and he was emergently intubated.  He was seen by PCCM.  TRH assumed care 3/30.    Assessment & Plan:   ATN with Acute Renal Failure with uremia and AMS - Pt started on hemodialysis 08/24/16.  Pt had TLC replaced 3/29 from fem to IJ. Nephrology team following and managing.  Creatinine slow to improve. Resume HD 4/3 per nephrologist.   Rhabdomyolysis - CK levels trending down.  Resolved now but significantly damaged patient's kidney function.   Schizophrenia / Bipolar disorder with Drug ingestion overdose - Pt was seen by psychiatry service in consultation 3/29.  He did not meet criteria for inpatient psychiatric hospitalization and will receive outpatient resources for follow up.  His psychiatric meds have been on hold since admission but now he is starting to have psychiatric symptoms with increasing anxiety and agitation. Resumed olanzapine 7.5 mg QHS and hydroxyzine PRN anxiety on 4/1.  He needs to get back on his other mood stabilizers as soon as possible but because of his renal function it is likely not safe to restart it at this time. Reassess renal function and restart them as it is safe  to do so. Assess mental health daily and reconsult psychiatry if needed.    Hypokalemia - repleted, following.     Chronic back pain / opioid dependence - pain is controlled on current regimen.  Continue oral pain meds as needed.     Chronic constipation - Pt had large BM last 2 days. Continue scheduled stool softeners. He had some mild abd distension today but was soft and reported had soft BM.   Chest Pain - Resolved now.  His troponin tests have been negative.  His chest pain resolved but having more upper respiratory symptoms which is being treated.   Aspiration - Pt had projectile vomiting episode 3/30 and now having thick yellow sputum production. It is improving now and xray is clear.  Continue doxycycline, aspiration precautions, follow clinically.  Added nebulizer treatments with good results.   Leukocytosis - WBC trending down now, Suspect related to aspiration pneumonia, portable CXR showed atelectasis, encouraged incentive spirometry.       GERD - protonix ordered for GI protection.   Essential hypertension - still suboptimally controlled, add amlodipine 5 mg on 4/2 with good results.      DVT prophylaxis: heparin Code Status: full  Family Communication: wife at bedside Disposition Plan: Home with [facility] services  Consultants:  Nephrology  psychiatry  Subjective: Pt reports some mild abd distension but having soft BMs, ambulating short distances but gets SOB.       ROS: as noted in history otherwise all reviewed negative.   Objective: Vitals:   09/01/16 2049 09/02/16 0529 09/02/16 0653 09/02/16 0757  BP:  135/76    Pulse: 83 79    Resp: 18 18    Temp:  98.1 F (36.7 C)    TempSrc:  Oral    SpO2: 99% 96%  96%  Weight:   110.8 kg (244 lb 4.3 oz)   Height:        Intake/Output Summary (Last 24 hours) at 09/02/16 1143 Last data filed at 09/02/16 0617  Gross per 24 hour  Intake              247 ml  Output             1005 ml  Net             -758 ml   Filed  Weights   08/31/16 0418 09/01/16 0409 09/02/16 0653  Weight: 110.2 kg (242 lb 14.4 oz) 110.9 kg (244 lb 8 oz) 110.8 kg (244 lb 4.3 oz)   Exam:  General exam: awake, alert, oriented, NAD.  Respiratory system: clear to auscultation.  No increased work of breathing. Cardiovascular system: S1 & S2 heard, RRR.  Gastrointestinal system: Abdomen is mildly distended, soft and nontender. Normal bowel sounds heard. Central nervous system: Alert and oriented. No focal neurological deficits. Extremities: no cyanosis.  Data Reviewed: Basic Metabolic Panel:  Recent Labs Lab 08/27/16 0330 08/28/16 0427 08/29/16 0215 08/30/16 0533 08/31/16 0505 09/01/16 0458 09/02/16 0357  NA 139 138 135 135 136 134* 134*  K 4.4 3.8 3.8 3.5 3.8 4.1 4.0  CL 98* 98* 96* 96* 99* 97* 94*  CO2 23 24 25 25 25 26 25   GLUCOSE 84 73 100* 99 104* 102* 85  BUN 72* 56* 80* 72* 45* 59* 74*  CREATININE 9.89* 7.57* 8.94* 8.26* 5.88* 7.46* 8.41*  CALCIUM 8.3* 8.3* 8.6* 8.6* 8.5* 8.6* 8.7*  MG 2.4 2.3 2.4 2.2  --   --   --   PHOS 9.1* 7.4* 7.4* 6.1* 4.0 4.7* 6.1*   Liver Function Tests:  Recent Labs Lab 08/29/16 0215 08/30/16 0533 08/31/16 0505 09/01/16 0458 09/02/16 0357  AST  --  57*  --   --   --   ALT  --  73*  --   --   --   ALKPHOS  --  144*  --   --   --   BILITOT  --  0.7  --   --   --   PROT  --  5.8*  --   --   --   ALBUMIN 2.3* 2.3* 2.4* 2.4* 2.3*   No results for input(s): LIPASE, AMYLASE in the last 168 hours. No results for input(s): AMMONIA in the last 168 hours. CBC:  Recent Labs Lab 08/27/16 0330 08/28/16 0427 08/29/16 0215 08/30/16 0533 08/31/16 0505  WBC 8.6 8.7 10.4 11.2* 10.2  HGB 11.4* 10.7* 11.3* 11.3* 10.7*  HCT 33.4* 31.3* 32.8* 33.2* 31.9*  MCV 84.1 83.5 82.2 82.2 82.6  PLT 205 201 220 233 213   Cardiac Enzymes:  Recent Labs Lab 08/27/16 0330 08/28/16 0427 08/28/16 2034 08/29/16 0215 08/29/16 0757 08/31/16 0505  CKTOTAL 1,412* 795*  --   --   --  214  TROPONINI   --   --  <0.03 <0.03 <0.03  --    CBG (last 3)  No results for input(s): GLUCAP in the last 72 hours. No results found for this or any previous visit (from the past 240 hour(s)).  Studies: No results found. Scheduled Meds: . amLODipine  5 mg Oral Daily  .  aspirin EC  81 mg Oral Daily  . calcium acetate  667 mg Oral TID WC  . docusate  100 mg Per Tube BID  . doxycycline  100 mg Oral Q12H  . feeding supplement  1 Container Oral TID BM  . heparin subcutaneous  5,000 Units Subcutaneous Q8H  . hydrALAZINE  25 mg Oral BID  . ipratropium-albuterol  3 mL Nebulization BID  . levothyroxine  75 mcg Oral QAC breakfast  . mouth rinse  15 mL Mouth Rinse BID  . metoprolol tartrate  50 mg Oral BID  . multivitamin  1 tablet Oral QHS  . OLANZapine  7.5 mg Oral QHS  . pantoprazole  40 mg Oral Daily  . sodium chloride flush  10-40 mL Intracatheter Q12H  . sodium chloride flush  10-40 mL Intracatheter Q12H  . sodium chloride flush  3 mL Intravenous Q12H   Continuous Infusions:  Principal Problem:   AKI (acute kidney injury) ([place]) Active Problems:   PAIN, CHRONIC NEC   GERD   Rhabdomyolysis   Transaminitis   Drug overdose, multiple drugs   Tachycardia   Accelerated hypertension  Time spent: 23 mins  Irwin Brakeman, MD, FAAFP [facility] Pager [phone]  If 7PM-7AM, please contact night-coverage [email or web address] Password [email or web address] 09/02/2016, 11:43 AM    LOS: 10 days

## 2016-09-03 ENCOUNTER — Encounter (HOSPITAL_COMMUNITY): Payer: Self-pay | Admitting: General Practice

## 2016-09-03 DIAGNOSIS — S36119A Unspecified injury of liver, initial encounter: Secondary | ICD-10-CM | POA: Diagnosis not present

## 2016-09-03 DIAGNOSIS — M6282 Rhabdomyolysis: Secondary | ICD-10-CM | POA: Diagnosis not present

## 2016-09-03 DIAGNOSIS — N170 Acute kidney failure with tubular necrosis: Secondary | ICD-10-CM | POA: Diagnosis not present

## 2016-09-03 DIAGNOSIS — F1120 Opioid dependence, uncomplicated: Secondary | ICD-10-CM | POA: Diagnosis not present

## 2016-09-03 DIAGNOSIS — J9601 Acute respiratory failure with hypoxia: Secondary | ICD-10-CM | POA: Diagnosis not present

## 2016-09-03 DIAGNOSIS — J95851 Ventilator associated pneumonia: Secondary | ICD-10-CM | POA: Diagnosis not present

## 2016-09-03 DIAGNOSIS — E785 Hyperlipidemia, unspecified: Secondary | ICD-10-CM

## 2016-09-03 DIAGNOSIS — R05 Cough: Secondary | ICD-10-CM

## 2016-09-03 DIAGNOSIS — R000 Tachycardia, unspecified: Secondary | ICD-10-CM

## 2016-09-03 DIAGNOSIS — D72829 Elevated white blood cell count, unspecified: Secondary | ICD-10-CM

## 2016-09-03 DIAGNOSIS — R740 Nonspecific elevation of levels of transaminase and lactic acid dehydrogenase [LDH]: Secondary | ICD-10-CM

## 2016-09-03 DIAGNOSIS — J9690 Respiratory failure, unspecified, unspecified whether with hypoxia or hypercapnia: Secondary | ICD-10-CM

## 2016-09-03 DIAGNOSIS — J9602 Acute respiratory failure with hypercapnia: Secondary | ICD-10-CM | POA: Diagnosis not present

## 2016-09-03 DIAGNOSIS — E874 Mixed disorder of acid-base balance: Secondary | ICD-10-CM | POA: Diagnosis not present

## 2016-09-03 DIAGNOSIS — E872 Acidosis: Secondary | ICD-10-CM | POA: Diagnosis not present

## 2016-09-03 DIAGNOSIS — R0602 Shortness of breath: Secondary | ICD-10-CM

## 2016-09-03 LAB — CBC
HEMATOCRIT: 29.3 % — AB (ref 39.0–52.0)
Hemoglobin: 9.9 g/dL — ABNORMAL LOW (ref 13.0–17.0)
MCH: 28 pg (ref 26.0–34.0)
MCHC: 33.8 g/dL (ref 30.0–36.0)
MCV: 83 fL (ref 78.0–100.0)
Platelets: 275 10*3/uL (ref 150–400)
RBC: 3.53 MIL/uL — ABNORMAL LOW (ref 4.22–5.81)
RDW: 13.1 % (ref 11.5–15.5)
WBC: 11.2 10*3/uL — ABNORMAL HIGH (ref 4.0–10.5)

## 2016-09-03 LAB — RENAL FUNCTION PANEL
ALBUMIN: 2.4 g/dL — AB (ref 3.5–5.0)
Anion gap: 11 (ref 5–15)
BUN: 52 mg/dL — AB (ref 6–20)
CALCIUM: 8.7 mg/dL — AB (ref 8.9–10.3)
CO2: 26 mmol/L (ref 22–32)
Chloride: 96 mmol/L — ABNORMAL LOW (ref 101–111)
Creatinine, Ser: 6.3 mg/dL — ABNORMAL HIGH (ref 0.61–1.24)
GFR calc Af Amer: 11 mL/min — ABNORMAL LOW (ref >60)
GFR calc non Af Amer: 9 mL/min — ABNORMAL LOW (ref >60)
GLUCOSE: 92 mg/dL (ref 65–99)
PHOSPHORUS: 5.4 mg/dL — AB (ref 2.5–4.6)
POTASSIUM: 3.8 mmol/L (ref 3.5–5.1)
SODIUM: 133 mmol/L — AB (ref 135–145)

## 2016-09-03 NOTE — Progress Notes (Signed)
Occupational Therapy Treatment Patient Details Name: [name] MRN: [id number] DOB: [birth year] Today's Date: 09/03/2016    History of present illness [name] is a 51 y/o man admitted in the early morning of 08/23/16 with rhabdomyolysis after an ingestion (wife thinks it was unintentional).  Over the course of his admission his renal function continued to decline and he developed worsening azotemia with altered mental status requiring dialysis.  He then developed increasing hypoxia thought to be due to fluid overload and he was emergently intubated. Extubated 08/27/16. PMH: chronic back pain, left tibial plateau fx, bipolar do, fibromyalgia, CHI x3   OT comments  Pt progressing well toward OT goals and has met 1/8 initial goals. Pt able to complete toilet transfer this session with supervision for safety. He also completed simulated IADL task to return sheets to bed with min guard assist for safety. Feel pt demonstrating good progress and updated D/C recommendations to home health OT accordingly. Will continue to follow acutely.   Follow Up Recommendations  Home health OT    Equipment Recommendations  3 in 1 bedside commode    Recommendations for Other Services      Precautions / Restrictions Precautions Precautions: Fall Precaution Comments: has fallen since admission Restrictions Weight Bearing Restrictions: No       Mobility Bed Mobility Overal bed mobility: Modified Independent                Transfers Overall transfer level: Needs assistance Equipment used: Rolling walker (2 wheeled) Transfers: Sit to/from Stand Sit to Stand: Supervision         General transfer comment: Supervision for safety.    Balance Overall balance assessment: Needs assistance Sitting-balance support: Feet supported Sitting balance-Leahy Scale: Good     Standing balance support: Bilateral upper extremity supported;No upper extremity supported;During functional activity Standing  balance-Leahy Scale: Fair Standing balance comment: Able to stand at sink for grooming tasks without UE support.                           ADL either performed or assessed with clinical judgement   ADL Overall ADL's : Needs assistance/impaired     Grooming: Oral care;Wash/dry hands;Standing;Supervision/safety Grooming Details (indicate cue type and reason): Supervision for safety in standing for grooming tasks.                 Toilet Transfer: Supervision/safety;Ambulation;RW           Functional mobility during ADLs: Supervision/safety;Rolling walker General ADL Comments: With min guard assist, pt able to fix bed sheets in preparation for return to bed as simulated IADL task.     Vision       Perception     Praxis      Cognition Arousal/Alertness: Awake/alert Behavior During Therapy: Flat affect;WFL for tasks assessed/performed Overall Cognitive Status: Within Functional Limits for tasks assessed                                          Exercises     Shoulder Instructions       General Comments      Pertinent Vitals/ Pain       Pain Assessment: Faces Faces Pain Scale: Hurts little more Pain Location: back pain, chronic Pain Descriptors / Indicators: Aching Pain Intervention(s): Limited activity within patient's tolerance;Monitored during session;Repositioned  Home Living  Prior Functioning/Environment              Frequency  Min 2X/week        Progress Toward Goals  OT Goals(current goals can now be found in the care plan section)  Progress towards OT goals: Progressing toward goals  Acute Rehab OT Goals Patient Stated Goal: return home OT Goal Formulation: With patient Time For Goal Achievement: 09/15/16 Potential to Achieve Goals: Good ADL Goals Pt Will Perform Grooming: with modified independence Pt Will Perform Upper Body Bathing: with  supervision Pt Will Perform Lower Body Bathing: with supervision Pt Will Perform Upper Body Dressing: with supervision Pt Will Perform Lower Body Dressing: with supervision Pt Will Transfer to Toilet: with supervision Pt Will Perform Toileting - Clothing Manipulation and hygiene: with supervision Pt Will Perform Tub/Shower Transfer: with supervision;rolling walker;ambulating;tub bench  Plan Discharge plan needs to be updated    Co-evaluation                 End of Session Equipment Utilized During Treatment: Gait belt;Rolling walker  OT Visit Diagnosis: Unsteadiness on feet (R26.81);Muscle weakness (generalized) (M62.81)   Activity Tolerance Patient tolerated treatment well   Patient Left in bed;with call bell/phone within reach;with bed alarm set   Nurse Communication Mobility status        Time: 1630-1650 OT Time Calculation (min): 20 min  Charges: OT General Charges $OT Visit: 1 Procedure OT Treatments $Self Care/Home Management : 8-22 mins  Travis Herrlich, MS OTR/L  Pager: [place] A [name] 09/03/2016, 5:49 PM

## 2016-09-03 NOTE — Clinical Social Work Note (Signed)
CSW consulted for SNF back up to CIR. PT now rec home health. CIR was initial rec, but per CIR pt is doing well and will not need an acute inpatient rehab admission. RNCM aware and following for d/c needs. CSW signing off as no further needs identified. Please reconsult if new needs arise.   Oretha Ellis, [place], [place] Work [phone]

## 2016-09-03 NOTE — Progress Notes (Signed)
Subjective: Interval History: has complaints distended but better.  Objective: Vital signs in last 24 hours: Temp:  [97.7 F (36.5 C)-98.7 F (37.1 C)] 98.7 F (37.1 C) (04/04 0531) Pulse Rate:  [75-93] 93 (04/04 0856) Resp:  [15-20] 18 (04/04 0531) BP: (125-168)/(69-93) 137/75 (04/04 0856) SpO2:  [95 %-98 %] 95 % (04/04 0531) Weight:  [109.9 kg (242 lb 4.6 oz)-111.3 kg (245 lb 6 oz)] 109.9 kg (242 lb 4.6 oz) (04/04 0651) Weight change: 0.5 kg (1 lb 1.6 oz)  Intake/Output from previous day: 04/03 0701 - 04/04 0700 In: 10 [I.V.:10] Out: 2100 [Urine:800] Intake/Output this shift: Total I/O In: -  Out: 550 [Urine:550]  General appearance: alert, cooperative, no distress, moderately obese and pale Neck: LIJ cath Resp: diminished breath sounds bilaterally Cardio: S1, S2 normal and systolic murmur: systolic ejection 2/6, decrescendo at 2nd left intercostal space GI: obese, pos bs, soft Extremities: extremities normal, atraumatic, no cyanosis or edema  Lab Results:  Recent Labs  09/02/16 1931 09/03/16 0546  WBC 10.3 11.2*  HGB 10.4* 9.9*  HCT 30.3* 29.3*  PLT 290 275   BMET:  Recent Labs  09/02/16 0357 09/03/16 0546  NA 134* 133*  K 4.0 3.8  CL 94* 96*  CO2 25 26  GLUCOSE 85 92  BUN 74* 52*  CREATININE 8.41* 6.30*  CALCIUM 8.7* 8.7*   No results for input(s): PTH in the last 72 hours. Iron Studies: No results for input(s): IRON, TIBC, TRANSFERRIN, FERRITIN in the last 72 hours.  Studies/Results: No results found.  I have reviewed the patient's current medications.  Assessment/Plan: 1 AKI/rhabdo  Nonoliguric. Had HD yest. Hopefully regaining fuction, no GFR yet 2 anemia 3 Schizo 4 Bipolar 5 Obesity 6 Drug OD per psych P follow chem, diet, psych f/u    LOS: 11 days   Deneane Stifter L 09/03/2016,11:45 AM

## 2016-09-03 NOTE — Progress Notes (Addendum)
PROGRESS NOTE    [name]  FXT:[id number] DOB: 07-07-1965 DOA: 08/23/2016 PCP: Isaias Cowman, PA-C   No chief complaint on file.  Brief Narrative:  HPI on 08/23/2016 by Dr. Lily Kocher [name] is a 51 y.o. gentleman with a history of Bipolar disorder, schizophrenia, chronic pain from his neck and back, and hypothyroidism who has a history of unintentional drug overdose and two prior episodes of rhadomyolysis causing acute kidney injury.  The patient presented to the outside hospital yesterday after being found down at home by his wife with altered mental status and generalized weakness.  No nausea or vomiting noted.  No bowel or bladder incontinence noted.  No convulsions noted.  He was taken to the ED at [facility] for emergent evaluation. The patient has a spine stimulator for management of his chronic pain.  His wife also reports recent changes to his pain medication (increase in dose), and she suspects that increased use of the pain medication precipitated confusion, which contributed to him mismanaging his other medications.  However, she reports refills of his medications approximately one week ago and his Zyprexa and vistaril bottles are now empty, and his gabapentin and buspar bottles are "half full".  The patient's wife does not think that he was trying to hurt himself.  Interim history  HD initiated 08/24/16.  Over the course of his admission his renal function continued to decline and he developed worsening azotemia with altered mental status requiring dialysis. He then developed increasing hypoxia thought to be due to fluid overload and he was emergently intubated.  He was seen by PCCM.  TRH assumed care 3/30.   Assessment & Plan   ATN with Acute Renal Failure with uremia and AMS  -Secondary to overdose/rhabdo -nephrology consulted and appreciated -Stated HD on 08/24/16 -S/p TLC replaced 3/29 from fem to IJ.  -Creatinine slow to improve.  -Continue to monitor  intake/output, BMP  Rhabdomyolysis  -CK levels trending down.   -Resolved now but significantly damaged patient's kidney function.   Schizophrenia / Bipolar disorder with Drug ingestion overdose   -Psychiatry consulted appreciated, seen on 08/28/2016. Currently does not meet criteria for inpatient psychiatric hospitalization and will receive outpatient resources for follow-up. -Catheter medications have been held due to renal function. -Resumed olanzapine 7.5 mg QHS and hydroxyzine PRN anxiety on 4/1.   -He needs to get back on his other mood stabilizers as soon as possible but because of his renal function it is likely not safe to restart it at this time. Reassess renal function and restart them as it is safe to do so. Assess mental health daily and reconsult psychiatry if needed.    Hypokalemia -Resolved, continue to monitor BMP  Chronic back pain / opioid dependence  -pain is controlled on current regimen, continue as needed  Chronic constipation -Patient has had bowel movements, will continue to monitor closely -Continued scheduled stool softeners   Chest Pain  -Resolved now.  His troponin tests have been negative.   -His chest pain resolved but having more upper respiratory symptoms which is being treated.   Aspiration  -Pt had projectile vomiting episode 3/30 and now having thick yellow sputum production.  -Improving -CXR  showed persistent right lung base atelectasis, no new abnormalities  -Continue aspiration precautions, incentive spirometry, nebulizer treatments  Leukocytosis  -WBC is between 10-11, no current source of infection. Possibly reactive -CXR as above. -Continue to monitor   GERD  -Continue protonix (prophylaxis)  Essential hypertension  -Continue amlodipine  Deconditioning -Likely secondary to the above and prolonged hospital stay. -PT consulted and recommended CIR. CIR recommended home health services.  DVT Prophylaxis  heparin  Code  Status: Full  Family Communication: None at bedside. Wife via phone  Disposition Plan: Admitted. Pending improvement in patient's renal function. Patient will need home health upon discharge  Consultants Nephrology PCCM Inpatient Rehab Psychiatry   Procedures  Insertion of HD catheter - Right IJ 3/24 Right femoral temp HD catheter, US guided Intubation/Extubation  Antibiotics   Anti-infectives    Start     Dose/Rate Route Frequency Ordered Stop   08/29/16 1000  doxycycline (VIBRA-TABS) tablet 100 mg     100 mg Oral Every 12 hours 08/29/16 0852 09/05/16 0175      Subjective:   [name] seen and examined today. Patient states he is feeling better today. Does complain of some mild abdominal distention. States he's been able to have bowel movements. Has been ambulating and having mild shortness of breath. Currently denies any chest pain, nausea or vomiting, dizziness or headache  Objective:   Vitals:   09/02/16 2217 09/03/16 0531 09/03/16 0651 09/03/16 0856  BP: (!) 143/76 (!) 142/73  137/75  Pulse: 79 80  93  Resp: 18 18    Temp: 97.7 F (36.5 C) 98.7 F (37.1 C)    TempSrc: Oral Oral    SpO2: 98% 95%    Weight:   109.9 kg (242 lb 4.6 oz)   Height:        Intake/Output Summary (Last 24 hours) at 09/03/16 1406 Last data filed at 09/03/16 1049  Gross per 24 hour  Intake               10 ml  Output             2450 ml  Net            -2440 ml   Filed Weights   09/02/16 1745 09/02/16 2157 09/03/16 0651  Weight: 111.3 kg (245 lb 6 oz) 110 kg (242 lb 8.1 oz) 109.9 kg (242 lb 4.6 oz)    Exam  General: Well developed, well nourished, NAD, appears stated age  HEENT: NCAT, mucous membranes moist.   Neck: Supple, L IJ   Cardiovascular: S1 S2 auscultated, 2/6SEM, Regular rate and rhythm.  Respiratory:Diminished but clear  Abdomen: Soft, obese, nontender, nondistended, + bowel sounds  Extremities: warm dry without cyanosis clubbing or edema  Neuro: AAOx3,  nonfocal  Psych:Appropriate mood and affect   Data Reviewed: I have personally reviewed following labs and imaging studies  CBC:  Recent Labs Lab 08/29/16 0215 08/30/16 0533 08/31/16 0505 09/02/16 1931 09/03/16 0546  WBC 10.4 11.2* 10.2 10.3 11.2*  HGB 11.3* 11.3* 10.7* 10.4* 9.9*  HCT 32.8* 33.2* 31.9* 30.3* 29.3*  MCV 82.2 82.2 82.6 82.3 83.0  PLT 220 233 213 290 102   Basic Metabolic Panel:  Recent Labs Lab 08/28/16 0427 08/29/16 0215 08/30/16 0533 08/31/16 0505 09/01/16 0458 09/02/16 0357 09/03/16 0546  NA 138 135 135 136 134* 134* 133*  K 3.8 3.8 3.5 3.8 4.1 4.0 3.8  CL 98* 96* 96* 99* 97* 94* 96*  CO2 24 25 25 25 26 25 26   GLUCOSE 73 100* 99 104* 102* 85 92  BUN 56* 80* 72* 45* 59* 74* 52*  CREATININE 7.57* 8.94* 8.26* 5.88* 7.46* 8.41* 6.30*  CALCIUM 8.3* 8.6* 8.6* 8.5* 8.6* 8.7* 8.7*  MG 2.3 2.4 2.2  --   --   --   --  PHOS 7.4* 7.4* 6.1* 4.0 4.7* 6.1* 5.4*   GFR: Estimated Creatinine Clearance: 18.2 mL/min (A) (by C-G formula based on SCr of 6.3 mg/dL (H)). Liver Function Tests:  Recent Labs Lab 08/30/16 0533 08/31/16 0505 09/01/16 0458 09/02/16 0357 09/03/16 0546  AST 57*  --   --   --   --   ALT 73*  --   --   --   --   ALKPHOS 144*  --   --   --   --   BILITOT 0.7  --   --   --   --   PROT 5.8*  --   --   --   --   ALBUMIN 2.3* 2.4* 2.4* 2.3* 2.4*   No results for input(s): LIPASE, AMYLASE in the last 168 hours. No results for input(s): AMMONIA in the last 168 hours. Coagulation Profile: No results for input(s): INR, PROTIME in the last 168 hours. Cardiac Enzymes:  Recent Labs Lab 08/28/16 0427 08/28/16 2034 08/29/16 0215 08/29/16 0757 08/31/16 0505  CKTOTAL 795*  --   --   --  214  TROPONINI  --  <0.03 <0.03 <0.03  --    BNP (last 3 results) No results for input(s): PROBNP in the last 8760 hours. HbA1C: No results for input(s): HGBA1C in the last 72 hours. CBG: No results for input(s): GLUCAP in the last 168  hours. Lipid Profile: No results for input(s): CHOL, HDL, LDLCALC, TRIG, CHOLHDL, LDLDIRECT in the last 72 hours. Thyroid Function Tests: No results for input(s): TSH, T4TOTAL, FREET4, T3FREE, THYROIDAB in the last 72 hours. Anemia Panel: No results for input(s): VITAMINB12, FOLATE, FERRITIN, TIBC, IRON, RETICCTPCT in the last 72 hours. Urine analysis:    Component Value Date/Time   COLORURINE YELLOW 08/25/2016 1118   APPEARANCEUR HAZY (A) 08/25/2016 1118   LABSPEC 1.012 08/25/2016 1118   PHURINE 5.0 08/25/2016 1118   GLUCOSEU 50 (A) 08/25/2016 1118   HGBUR LARGE (A) 08/25/2016 1118   BILIRUBINUR NEGATIVE 08/25/2016 1118   KETONESUR NEGATIVE 08/25/2016 1118   PROTEINUR 100 (A) 08/25/2016 1118   UROBILINOGEN 1.0 10/20/2013 1247   NITRITE NEGATIVE 08/25/2016 1118   LEUKOCYTESUR MODERATE (A) 08/25/2016 1118   Sepsis Labs: @LABRCNTIP (procalcitonin:4,lacticidven:4)  )No results found for this or any previous visit (from the past 240 hour(s)).    Radiology Studies: No results found.   Scheduled Meds: . amLODipine  5 mg Oral Daily  . aspirin EC  81 mg Oral Daily  . calcium acetate  667 mg Oral TID WC  . docusate  100 mg Per Tube BID  . doxycycline  100 mg Oral Q12H  . feeding supplement (NEPRO CARB STEADY)  237 mL Oral BID BM  . heparin subcutaneous  5,000 Units Subcutaneous Q8H  . hydrALAZINE  25 mg Oral BID  . ipratropium-albuterol  3 mL Nebulization BID  . levothyroxine  75 mcg Oral QAC breakfast  . mouth rinse  15 mL Mouth Rinse BID  . metoprolol tartrate  50 mg Oral BID  . multivitamin  1 tablet Oral QHS  . OLANZapine  7.5 mg Oral QHS  . pantoprazole  40 mg Oral Daily  . sodium chloride flush  10-40 mL Intracatheter Q12H  . sodium chloride flush  10-40 mL Intracatheter Q12H  . sodium chloride flush  3 mL Intravenous Q12H   Continuous Infusions:   LOS: 11 days   Time Spent in minutes   30 minutes  Alvy Alsop D.O. on 09/03/2016 at 2:06 PM  Between 7am to  7pm - Pager - [phone]  After 7pm go to [email or web address] - password [email or web address]  And look for the night coverage person covering for me after hours  [facility] Office  [phone]

## 2016-09-04 DIAGNOSIS — G8929 Other chronic pain: Secondary | ICD-10-CM

## 2016-09-04 DIAGNOSIS — K219 Gastro-esophageal reflux disease without esophagitis: Secondary | ICD-10-CM

## 2016-09-04 LAB — RENAL FUNCTION PANEL
ANION GAP: 12 (ref 5–15)
Albumin: 2.4 g/dL — ABNORMAL LOW (ref 3.5–5.0)
BUN: 59 mg/dL — ABNORMAL HIGH (ref 6–20)
CHLORIDE: 95 mmol/L — AB (ref 101–111)
CO2: 27 mmol/L (ref 22–32)
Calcium: 9.1 mg/dL (ref 8.9–10.3)
Creatinine, Ser: 7.23 mg/dL — ABNORMAL HIGH (ref 0.61–1.24)
GFR calc Af Amer: 9 mL/min — ABNORMAL LOW (ref >60)
GFR calc non Af Amer: 8 mL/min — ABNORMAL LOW (ref >60)
GLUCOSE: 90 mg/dL (ref 65–99)
POTASSIUM: 3.7 mmol/L (ref 3.5–5.1)
Phosphorus: 6.4 mg/dL — ABNORMAL HIGH (ref 2.5–4.6)
Sodium: 134 mmol/L — ABNORMAL LOW (ref 135–145)

## 2016-09-04 LAB — CBC
HEMATOCRIT: 29.4 % — AB (ref 39.0–52.0)
HEMOGLOBIN: 9.7 g/dL — AB (ref 13.0–17.0)
MCH: 27.6 pg (ref 26.0–34.0)
MCHC: 33 g/dL (ref 30.0–36.0)
MCV: 83.5 fL (ref 78.0–100.0)
Platelets: 364 10*3/uL (ref 150–400)
RBC: 3.52 MIL/uL — ABNORMAL LOW (ref 4.22–5.81)
RDW: 13.2 % (ref 11.5–15.5)
WBC: 12.9 10*3/uL — ABNORMAL HIGH (ref 4.0–10.5)

## 2016-09-04 LAB — GLUCOSE, CAPILLARY: Glucose-Capillary: 112 mg/dL — ABNORMAL HIGH (ref 65–99)

## 2016-09-04 MED ORDER — DOCUSATE SODIUM 100 MG PO CAPS
100.0000 mg | ORAL_CAPSULE | Freq: Two times a day (BID) | ORAL | Status: DC
  Administered 2016-09-04 – 2016-09-09 (×7): 100 mg via ORAL
  Filled 2016-09-04 (×8): qty 1

## 2016-09-04 NOTE — Progress Notes (Signed)
PT Cancellation Note  Patient Details Name: [name] MRN: [id number] DOB: June 05, 1965   Cancelled Treatment:    Reason Eval/Treat Not Completed: Other (comment) (Pt just ambulated with RN will return in pm for PT tx.  )   Daruis Swaim Eli Hose 09/04/2016, 11:46 AM Governor Rooks, PTA pager [phone]

## 2016-09-04 NOTE — Progress Notes (Signed)
PT Cancellation Note  Patient Details Name: [name] MRN: [id number] DOB: 1965/08/29   Cancelled Treatment:    Reason Eval/Treat Not Completed: Pain limiting ability to participate (Remains to c/o intolerable back pain.  PTA applied heat packs to B Lower back in hopes to alleviate pain.  )   [name] Eli Hose 09/04/2016, 4:07 PM Governor Rooks, PTA pager [phone]

## 2016-09-04 NOTE — Progress Notes (Signed)
Pt called complaining of pain in his left testicle. Pt's described it as a sharp, shooting pain that would come and go. He rated it at an 8/10 when it came. He has previously had this pain, however, he said that it was a long time ago and that it was not to this extent. Pt was not feeling the pain when I arrived to his room. Will continue to assess.

## 2016-09-04 NOTE — Progress Notes (Signed)
Subjective: Interval History: has no complaint, stom better.  Objective: Vital signs in last 24 hours: Temp:  [98.1 F (36.7 C)-98.4 F (36.9 C)] 98.1 F (36.7 C) (04/05 0548) Pulse Rate:  [76-87] 79 (04/05 0808) Resp:  [18-20] 18 (04/05 0548) BP: (124-148)/(72-94) 148/94 (04/05 0808) SpO2:  [94 %-97 %] 94 % (04/05 0821) Weight:  [107 kg (235 lb 14.3 oz)] 107 kg (235 lb 14.3 oz) (04/05 0600) Weight change: -4.3 kg (-9 lb 7.7 oz)  Intake/Output from previous day: 04/04 0701 - 04/05 0700 In: 950 [P.O.:950] Out: 2800 [Urine:2800] Intake/Output this shift: Total I/O In: -  Out: 400 [Urine:400]  General appearance: cooperative, moderately obese, pale and slowed mentation Neck: LIJ cath Resp: clear to auscultation bilaterally Cardio: S1, S2 normal GI: obese, pos bs, Extremities: extremities normal, atraumatic, no cyanosis or edema  Lab Results:  Recent Labs  09/03/16 0546 09/04/16 0500  WBC 11.2* 12.9*  HGB 9.9* 9.7*  HCT 29.3* 29.4*  PLT 275 364   BMET:  Recent Labs  09/03/16 0546 09/04/16 0500  NA 133* 134*  K 3.8 3.7  CL 96* 95*  CO2 26 27  GLUCOSE 92 90  BUN 52* 59*  CREATININE 6.30* 7.23*  CALCIUM 8.7* 9.1   No results for input(s): PTH in the last 72 hours. Iron Studies: No results for input(s): IRON, TIBC, TRANSFERRIN, FERRITIN in the last 72 hours.  Studies/Results: No results found.  I have reviewed the patient's current medications.  Assessment/Plan: 1 AKI nonoliguric .Rate of rise is less.  Need to cont to follow. Vol/acid/base/k ok 2 Anemia stable 3 Drug OD 4 Schizo 5 bipolar P follow chem, diet, may need Hd in am    LOS: 12 days   Lutricia Widjaja L 09/04/2016,12:09 PM

## 2016-09-04 NOTE — Progress Notes (Signed)
OT Cancellation Note  Patient Details Name: [name] MRN: [id number] DOB: December 27, 1965   Cancelled Treatment:    Reason Eval/Treat Not Completed: Patient declined, no reason specified. Pt declined therapy this afternoon secondary to increased pain. Pt reports he has already called for medications. Pt remained supine with call bell and all needed items within reach.  Gypsy Decant, MS, OTR/L, CBIS 09/04/2016, 2:35 PM

## 2016-09-04 NOTE — Progress Notes (Signed)
PROGRESS NOTE    [name]  DPO:[id number] DOB: 06-21-1965 DOA: 08/23/2016 PCP: Isaias Cowman, PA-C   No chief complaint on file.  Brief Narrative:  HPI on 08/23/2016 by Dr. Lily Kocher [name] is a 51 y.o. gentleman with a history of Bipolar disorder, schizophrenia, chronic pain from his neck and back, and hypothyroidism who has a history of unintentional drug overdose and two prior episodes of rhadomyolysis causing acute kidney injury.  The patient presented to the outside hospital yesterday after being found down at home by his wife with altered mental status and generalized weakness.  No nausea or vomiting noted.  No bowel or bladder incontinence noted.  No convulsions noted.  He was taken to the ED at [facility] for emergent evaluation. The patient has a spine stimulator for management of his chronic pain.  His wife also reports recent changes to his pain medication (increase in dose), and she suspects that increased use of the pain medication precipitated confusion, which contributed to him mismanaging his other medications.  However, she reports refills of his medications approximately one week ago and his Zyprexa and vistaril bottles are now empty, and his gabapentin and buspar bottles are "half full".  The patient's wife does not think that he was trying to hurt himself.  Interim history  HD initiated 08/24/16.  Over the course of his admission his renal function continued to decline and he developed worsening azotemia with altered mental status requiring dialysis. He then developed increasing hypoxia thought to be due to fluid overload and he was emergently intubated.  He was seen by PCCM.  TRH assumed care 3/30.   Assessment & Plan   ATN with Acute Renal Failure with uremia and AMS  -Secondary to overdose/rhabdo -nephrology consulted and appreciated -Stated HD on 08/24/16 -S/p TLC replaced 3/29 from fem to IJ.  -Creatinine slow to improve.  -Continue to monitor  intake/output, BMP -Creatinine today 7.23 -Discussed with Dr. Jimmy Footman, will continue to monitor creatinine, likely until 4/9.   Rhabdomyolysis  -CK levels trending down.   -Resolved now but significantly damaged patient's kidney function.   Schizophrenia / Bipolar disorder with Drug ingestion overdose   -Psychiatry consulted appreciated, seen on 08/28/2016. Currently does not meet criteria for inpatient psychiatric hospitalization and will receive outpatient resources for follow-up. -Catheter medications have been held due to renal function. -Resumed olanzapine 7.5 mg QHS and hydroxyzine PRN anxiety on 4/1.   -He needs to get back on his other mood stabilizers as soon as possible but because of his renal function it is likely not safe to restart it at this time. Reassess renal function and restart them as it is safe to do so. Assess mental health daily and reconsult psychiatry if needed.    Hypokalemia -Resolved, continue to monitor BMP  Chronic back pain / opioid dependence  -pain is controlled on current regimen, continue as needed  Chronic constipation -Patient has had bowel movements, will continue to monitor closely -Continued scheduled stool softeners   Chest Pain  -Resolved now.  His troponin tests have been negative.   -His chest pain resolved but having more upper respiratory symptoms which is being treated.   Aspiration  -Pt had projectile vomiting episode 3/30 and now having thick yellow sputum production.  -Improving -CXR  showed persistent right lung base atelectasis, no new abnormalities  -Continue aspiration precautions, incentive spirometry, nebulizer treatments  Leukocytosis  -WBC 12.9, no current source of infection. Possibly reactive -CXR as above. -Continue to monitor  GERD  -Continue protonix (prophylaxis)  Essential hypertension  -Continue amlodipine  Deconditioning -Likely secondary to the above and prolonged hospital stay. -PT  consulted and recommended CIR. CIR recommended home health services.  DVT Prophylaxis  heparin  Code Status: Full  Family Communication: None at bedside. Wife via phone  Disposition Plan: Admitted. Pending improvement in patient's renal function. Patient will need home health upon discharge  Consultants Nephrology PCCM Inpatient Rehab Psychiatry   Procedures  Insertion of HD catheter - Right IJ 3/24 Right femoral temp HD catheter, US guided Intubation/Extubation  Antibiotics   Anti-infectives    Start     Dose/Rate Route Frequency Ordered Stop   08/29/16 1000  doxycycline (VIBRA-TABS) tablet 100 mg     100 mg Oral Every 12 hours 08/29/16 0852 09/05/16 0923      Subjective:   [name] seen and examined today. Patient states he is feeling better today, feels swelling is improving. Currently denies any chest pain, nausea or vomiting, dizziness or headache  Objective:   Vitals:   09/04/16 0548 09/04/16 0600 09/04/16 0808 09/04/16 0821  BP: (!) 144/80  (!) 148/94   Pulse: 87  79   Resp: 18     Temp: 98.1 F (36.7 C)     TempSrc: Oral     SpO2: 95%   94%  Weight:  107 kg (235 lb 14.3 oz)    Height:  6' (1.829 m)      Intake/Output Summary (Last 24 hours) at 09/04/16 1351 Last data filed at 09/04/16 1051  Gross per 24 hour  Intake              710 ml  Output             2650 ml  Net            -1940 ml   Filed Weights   09/02/16 2157 09/03/16 0651 09/04/16 0600  Weight: 110 kg (242 lb 8.1 oz) 109.9 kg (242 lb 4.6 oz) 107 kg (235 lb 14.3 oz)    Exam  General: Well developed, well nourished, NAD, appears stated age  HEENT: NCAT, mucous membranes moist.   Neck: Supple, L IJ   Cardiovascular: S1 S2 auscultated, 2/6SEM, Regular rate and rhythm.  Respiratory:Diminished but clear  Abdomen: Soft, obese, nontender, nondistended, + bowel sounds  Extremities: warm dry without cyanosis clubbing or edema  Neuro: AAOx3, nonfocal  Psych:Appropriate mood and  affect, pleasant   Data Reviewed: I have personally reviewed following labs and imaging studies  CBC:  Recent Labs Lab 08/30/16 0533 08/31/16 0505 09/02/16 1931 09/03/16 0546 09/04/16 0500  WBC 11.2* 10.2 10.3 11.2* 12.9*  HGB 11.3* 10.7* 10.4* 9.9* 9.7*  HCT 33.2* 31.9* 30.3* 29.3* 29.4*  MCV 82.2 82.6 82.3 83.0 83.5  PLT 233 213 290 275 300   Basic Metabolic Panel:  Recent Labs Lab 08/29/16 0215 08/30/16 0533 08/31/16 0505 09/01/16 0458 09/02/16 0357 09/03/16 0546 09/04/16 0500  NA 135 135 136 134* 134* 133* 134*  K 3.8 3.5 3.8 4.1 4.0 3.8 3.7  CL 96* 96* 99* 97* 94* 96* 95*  CO2 25 25 25 26 25 26 27   GLUCOSE 100* 99 104* 102* 85 92 90  BUN 80* 72* 45* 59* 74* 52* 59*  CREATININE 8.94* 8.26* 5.88* 7.46* 8.41* 6.30* 7.23*  CALCIUM 8.6* 8.6* 8.5* 8.6* 8.7* 8.7* 9.1  MG 2.4 2.2  --   --   --   --   --   PHOS  7.4* 6.1* 4.0 4.7* 6.1* 5.4* 6.4*   GFR: Estimated Creatinine Clearance: 15.6 mL/min (A) (by C-G formula based on SCr of 7.23 mg/dL (H)). Liver Function Tests:  Recent Labs Lab 08/30/16 0533 08/31/16 0505 09/01/16 0458 09/02/16 0357 09/03/16 0546 09/04/16 0500  AST 57*  --   --   --   --   --   ALT 73*  --   --   --   --   --   ALKPHOS 144*  --   --   --   --   --   BILITOT 0.7  --   --   --   --   --   PROT 5.8*  --   --   --   --   --   ALBUMIN 2.3* 2.4* 2.4* 2.3* 2.4* 2.4*   No results for input(s): LIPASE, AMYLASE in the last 168 hours. No results for input(s): AMMONIA in the last 168 hours. Coagulation Profile: No results for input(s): INR, PROTIME in the last 168 hours. Cardiac Enzymes:  Recent Labs Lab 08/28/16 2034 08/29/16 0215 08/29/16 0757 08/31/16 0505  CKTOTAL  --   --   --  214  TROPONINI <0.03 <0.03 <0.03  --    BNP (last 3 results) No results for input(s): PROBNP in the last 8760 hours. HbA1C: No results for input(s): HGBA1C in the last 72 hours. CBG:  Recent Labs Lab 09/04/16 1218  GLUCAP 112*   Lipid  Profile: No results for input(s): CHOL, HDL, LDLCALC, TRIG, CHOLHDL, LDLDIRECT in the last 72 hours. Thyroid Function Tests: No results for input(s): TSH, T4TOTAL, FREET4, T3FREE, THYROIDAB in the last 72 hours. Anemia Panel: No results for input(s): VITAMINB12, FOLATE, FERRITIN, TIBC, IRON, RETICCTPCT in the last 72 hours. Urine analysis:    Component Value Date/Time   COLORURINE YELLOW 08/25/2016 1118   APPEARANCEUR HAZY (A) 08/25/2016 1118   LABSPEC 1.012 08/25/2016 1118   PHURINE 5.0 08/25/2016 1118   GLUCOSEU 50 (A) 08/25/2016 1118   HGBUR LARGE (A) 08/25/2016 1118   BILIRUBINUR NEGATIVE 08/25/2016 1118   KETONESUR NEGATIVE 08/25/2016 1118   PROTEINUR 100 (A) 08/25/2016 1118   UROBILINOGEN 1.0 10/20/2013 1247   NITRITE NEGATIVE 08/25/2016 1118   LEUKOCYTESUR MODERATE (A) 08/25/2016 1118   Sepsis Labs: @LABRCNTIP (procalcitonin:4,lacticidven:4)  )No results found for this or any previous visit (from the past 240 hour(s)).    Radiology Studies: No results found.   Scheduled Meds: . amLODipine  5 mg Oral Daily  . aspirin EC  81 mg Oral Daily  . calcium acetate  667 mg Oral TID WC  . docusate sodium  100 mg Oral BID  . doxycycline  100 mg Oral Q12H  . feeding supplement (NEPRO CARB STEADY)  237 mL Oral BID BM  . heparin subcutaneous  5,000 Units Subcutaneous Q8H  . hydrALAZINE  25 mg Oral BID  . levothyroxine  75 mcg Oral QAC breakfast  . mouth rinse  15 mL Mouth Rinse BID  . metoprolol tartrate  50 mg Oral BID  . multivitamin  1 tablet Oral QHS  . OLANZapine  7.5 mg Oral QHS  . pantoprazole  40 mg Oral Daily  . sodium chloride flush  10-40 mL Intracatheter Q12H  . sodium chloride flush  10-40 mL Intracatheter Q12H  . sodium chloride flush  3 mL Intravenous Q12H   Continuous Infusions:   LOS: 12 days   Time Spent in minutes   30 minutes  Dhwani Venkatesh D.O. on  09/04/2016 at 1:51 PM  Between 7am to 7pm - Pager - [phone]  After 7pm go to [email or web address] -  password [email or web address]  And look for the night coverage person covering for me after hours  [facility] Office  [phone]

## 2016-09-05 ENCOUNTER — Inpatient Hospital Stay (HOSPITAL_COMMUNITY): Payer: 59

## 2016-09-05 DIAGNOSIS — F1120 Opioid dependence, uncomplicated: Secondary | ICD-10-CM | POA: Diagnosis not present

## 2016-09-05 DIAGNOSIS — E874 Mixed disorder of acid-base balance: Secondary | ICD-10-CM | POA: Diagnosis not present

## 2016-09-05 DIAGNOSIS — J9601 Acute respiratory failure with hypoxia: Secondary | ICD-10-CM | POA: Diagnosis not present

## 2016-09-05 DIAGNOSIS — N170 Acute kidney failure with tubular necrosis: Secondary | ICD-10-CM | POA: Diagnosis not present

## 2016-09-05 DIAGNOSIS — J95851 Ventilator associated pneumonia: Secondary | ICD-10-CM | POA: Diagnosis not present

## 2016-09-05 DIAGNOSIS — M6282 Rhabdomyolysis: Secondary | ICD-10-CM | POA: Diagnosis not present

## 2016-09-05 DIAGNOSIS — S36119A Unspecified injury of liver, initial encounter: Secondary | ICD-10-CM | POA: Diagnosis not present

## 2016-09-05 DIAGNOSIS — E872 Acidosis: Secondary | ICD-10-CM | POA: Diagnosis not present

## 2016-09-05 DIAGNOSIS — J9602 Acute respiratory failure with hypercapnia: Secondary | ICD-10-CM | POA: Diagnosis not present

## 2016-09-05 LAB — RENAL FUNCTION PANEL
ANION GAP: 15 (ref 5–15)
Albumin: 2.4 g/dL — ABNORMAL LOW (ref 3.5–5.0)
BUN: 65 mg/dL — AB (ref 6–20)
CALCIUM: 9.5 mg/dL (ref 8.9–10.3)
CO2: 24 mmol/L (ref 22–32)
Chloride: 94 mmol/L — ABNORMAL LOW (ref 101–111)
Creatinine, Ser: 7.74 mg/dL — ABNORMAL HIGH (ref 0.61–1.24)
GFR calc Af Amer: 8 mL/min — ABNORMAL LOW (ref >60)
GFR calc non Af Amer: 7 mL/min — ABNORMAL LOW (ref >60)
GLUCOSE: 86 mg/dL (ref 65–99)
POTASSIUM: 3.8 mmol/L (ref 3.5–5.1)
Phosphorus: 7.5 mg/dL — ABNORMAL HIGH (ref 2.5–4.6)
SODIUM: 133 mmol/L — AB (ref 135–145)

## 2016-09-05 LAB — CBC
HCT: 29.3 % — ABNORMAL LOW (ref 39.0–52.0)
HEMOGLOBIN: 9.7 g/dL — AB (ref 13.0–17.0)
MCH: 27.6 pg (ref 26.0–34.0)
MCHC: 33.1 g/dL (ref 30.0–36.0)
MCV: 83.5 fL (ref 78.0–100.0)
Platelets: 406 10*3/uL — ABNORMAL HIGH (ref 150–400)
RBC: 3.51 MIL/uL — ABNORMAL LOW (ref 4.22–5.81)
RDW: 12.8 % (ref 11.5–15.5)
WBC: 13.9 10*3/uL — ABNORMAL HIGH (ref 4.0–10.5)

## 2016-09-05 LAB — URINALYSIS, ROUTINE W REFLEX MICROSCOPIC
Bilirubin Urine: NEGATIVE
Glucose, UA: NEGATIVE mg/dL
Ketones, ur: NEGATIVE mg/dL
Leukocytes, UA: NEGATIVE
Nitrite: NEGATIVE
Protein, ur: NEGATIVE mg/dL
SPECIFIC GRAVITY, URINE: 1.005 (ref 1.005–1.030)
Squamous Epithelial / LPF: NONE SEEN
pH: 7 (ref 5.0–8.0)

## 2016-09-05 NOTE — Progress Notes (Signed)
PROGRESS NOTE    [name]  VFI:[id number] DOB: January 29, 1966 DOA: 08/23/2016 PCP: Isaias Cowman, PA-C   No chief complaint on file.  Brief Narrative:  HPI on 08/23/2016 by Dr. Lily Kocher [name] is a 51 y.o. gentleman with a history of Bipolar disorder, schizophrenia, chronic pain from his neck and back, and hypothyroidism who has a history of unintentional drug overdose and two prior episodes of rhadomyolysis causing acute kidney injury.  The patient presented to the outside hospital yesterday after being found down at home by his wife with altered mental status and generalized weakness.  No nausea or vomiting noted.  No bowel or bladder incontinence noted.  No convulsions noted.  He was taken to the ED at [facility] for emergent evaluation. The patient has a spine stimulator for management of his chronic pain.  His wife also reports recent changes to his pain medication (increase in dose), and she suspects that increased use of the pain medication precipitated confusion, which contributed to him mismanaging his other medications.  However, she reports refills of his medications approximately one week ago and his Zyprexa and vistaril bottles are now empty, and his gabapentin and buspar bottles are "half full".  The patient's wife does not think that he was trying to hurt himself.  Interim history  HD initiated 08/24/16.  Over the course of his admission his renal function continued to decline and he developed worsening azotemia with altered mental status requiring dialysis. He then developed increasing hypoxia thought to be due to fluid overload and he was emergently intubated.  He was seen by PCCM.  TRH assumed care 3/30.   Assessment & Plan   ATN with Acute Renal Failure with uremia and AMS  -Secondary to overdose/rhabdo -nephrology consulted and appreciated -Stated HD on 08/24/16 -S/p TLC replaced 3/29 from fem to IJ.  -Creatinine slow to improve.  -Continue to monitor  intake/output, BMP -Urine output over the past 24 hours 2175cc -Creatinine today 7.74 -Discussed with Dr. Jimmy Footman, will continue to monitor creatinine, likely until 4/9.   Rhabdomyolysis  -CK levels trending down.   -Resolved now but significantly damaged patient's kidney function.   Schizophrenia / Bipolar disorder with Drug ingestion overdose   -Psychiatry consulted appreciated, seen on 08/28/2016. Currently does not meet criteria for inpatient psychiatric hospitalization and will receive outpatient resources for follow-up. -Catheter medications have been held due to renal function. -Resumed olanzapine 7.5 mg QHS and hydroxyzine PRN anxiety on 4/1.   -He needs to get back on his other mood stabilizers as soon as possible but because of his renal function it is likely not safe to restart it at this time. Reassess renal function and restart them as it is safe to do so. Assess mental health daily and reconsult psychiatry if needed.    Hypokalemia -Resolved, continue to monitor BMP  Chronic back pain / opioid dependence  -pain is controlled on current regimen, continue as needed  Chronic constipation -Patient has had bowel movements, will continue to monitor closely -Continued scheduled stool softeners   Chest Pain  -Resolved now.  His troponin tests have been negative.   -His chest pain resolved but having more upper respiratory symptoms which is being treated.   Aspiration  -Pt had projectile vomiting episode 3/30 and now having thick yellow sputum production.  -Improving -CXR  showed persistent right lung base atelectasis, no new abnormalities  -Continue aspiration precautions, incentive spirometry, nebulizer treatments  Leukocytosis  -WBC 13.9, no current source of infection.  Possibly reactive -Chest x-ray and UA obtained, unremarkable for infection -Continue to monitor   GERD  -Continue protonix (prophylaxis)  Essential hypertension  -Continue  amlodipine  Deconditioning -Likely secondary to the above and prolonged hospital stay. -PT consulted and recommended CIR. CIR recommended home health services.  DVT Prophylaxis  heparin  Code Status: Full  Family Communication: None at bedside. Wife via phone  Disposition Plan: Admitted. Pending improvement in patient's renal function. Patient will need home health upon discharge  Consultants Nephrology PCCM Inpatient Rehab Psychiatry   Procedures  Insertion of HD catheter - Right IJ 3/24 Right femoral temp HD catheter, US guided Intubation/Extubation  Antibiotics   Anti-infectives    Start     Dose/Rate Route Frequency Ordered Stop   08/29/16 1000  doxycycline (VIBRA-TABS) tablet 100 mg     100 mg Oral Every 12 hours 08/29/16 0852 09/05/16 3329      Subjective:   [name] seen and examined today. Patient continues to feel mildly better. Does have mild pain occasionally. Currently denies any chest pain, shortness of breath, abdominal pain, nausea or vomiting.   Objective:   Vitals:   09/04/16 1525 09/04/16 2143 09/05/16 0539 09/05/16 0823  BP: (!) 163/54 140/86 140/80 138/82  Pulse: 80 84 77 77  Resp: 18 18 17 16   Temp: 98.6 F (37 C) 98.2 F (36.8 C) 98.1 F (36.7 C) 98 F (36.7 C)  TempSrc: Oral Oral Oral Oral  SpO2: 93% 97% 96% 99%  Weight:      Height:        Intake/Output Summary (Last 24 hours) at 09/05/16 1129 Last data filed at 09/05/16 0935  Gross per 24 hour  Intake             1400 ml  Output             1775 ml  Net             -375 ml   Filed Weights   09/02/16 2157 09/03/16 0651 09/04/16 0600  Weight: 110 kg (242 lb 8.1 oz) 109.9 kg (242 lb 4.6 oz) 107 kg (235 lb 14.3 oz)    Exam  General: Well developed, well nourished, NAD  HEENT: NCAT, mucous membranes moist.   Neck: Supple, L IJ   Cardiovascular: S1 S2 auscultated, 2/6SEM, RRR  Respiratory:Diminished but clear  Abdomen: Soft, obese, nontender, nondistended, + bowel  sounds  Extremities: warm dry without cyanosis clubbing or edema  Neuro: AAOx3, nonfocal  Psych:Appropriate mood and affect, pleasant   Data Reviewed: I have personally reviewed following labs and imaging studies  CBC:  Recent Labs Lab 08/31/16 0505 09/02/16 1931 09/03/16 0546 09/04/16 0500 09/05/16 0511  WBC 10.2 10.3 11.2* 12.9* 13.9*  HGB 10.7* 10.4* 9.9* 9.7* 9.7*  HCT 31.9* 30.3* 29.3* 29.4* 29.3*  MCV 82.6 82.3 83.0 83.5 83.5  PLT 213 290 275 364 518*   Basic Metabolic Panel:  Recent Labs Lab 08/30/16 0533  09/01/16 0458 09/02/16 0357 09/03/16 0546 09/04/16 0500 09/05/16 0511  NA 135  < > 134* 134* 133* 134* 133*  K 3.5  < > 4.1 4.0 3.8 3.7 3.8  CL 96*  < > 97* 94* 96* 95* 94*  CO2 25  < > 26 25 26 27 24   GLUCOSE 99  < > 102* 85 92 90 86  BUN 72*  < > 59* 74* 52* 59* 65*  CREATININE 8.26*  < > 7.46* 8.41* 6.30* 7.23* 7.74*  CALCIUM 8.6*  < >  8.6* 8.7* 8.7* 9.1 9.5  MG 2.2  --   --   --   --   --   --   PHOS 6.1*  < > 4.7* 6.1* 5.4* 6.4* 7.5*  < > = values in this interval not displayed. GFR: Estimated Creatinine Clearance: 14.6 mL/min (A) (by C-G formula based on SCr of 7.74 mg/dL (H)). Liver Function Tests:  Recent Labs Lab 08/30/16 0533  09/01/16 0458 09/02/16 0357 09/03/16 0546 09/04/16 0500 09/05/16 0511  AST 57*  --   --   --   --   --   --   ALT 73*  --   --   --   --   --   --   ALKPHOS 144*  --   --   --   --   --   --   BILITOT 0.7  --   --   --   --   --   --   PROT 5.8*  --   --   --   --   --   --   ALBUMIN 2.3*  < > 2.4* 2.3* 2.4* 2.4* 2.4*  < > = values in this interval not displayed. No results for input(s): LIPASE, AMYLASE in the last 168 hours. No results for input(s): AMMONIA in the last 168 hours. Coagulation Profile: No results for input(s): INR, PROTIME in the last 168 hours. Cardiac Enzymes:  Recent Labs Lab 08/31/16 0505  CKTOTAL 214   BNP (last 3 results) No results for input(s): PROBNP in the last 8760  hours. HbA1C: No results for input(s): HGBA1C in the last 72 hours. CBG:  Recent Labs Lab 09/04/16 1218  GLUCAP 112*   Lipid Profile: No results for input(s): CHOL, HDL, LDLCALC, TRIG, CHOLHDL, LDLDIRECT in the last 72 hours. Thyroid Function Tests: No results for input(s): TSH, T4TOTAL, FREET4, T3FREE, THYROIDAB in the last 72 hours. Anemia Panel: No results for input(s): VITAMINB12, FOLATE, FERRITIN, TIBC, IRON, RETICCTPCT in the last 72 hours. Urine analysis:    Component Value Date/Time   COLORURINE STRAW (A) 09/05/2016 0824   APPEARANCEUR CLEAR 09/05/2016 0824   LABSPEC 1.005 09/05/2016 0824   PHURINE 7.0 09/05/2016 0824   GLUCOSEU NEGATIVE 09/05/2016 0824   HGBUR SMALL (A) 09/05/2016 0824   BILIRUBINUR NEGATIVE 09/05/2016 0824   KETONESUR NEGATIVE 09/05/2016 0824   PROTEINUR NEGATIVE 09/05/2016 0824   UROBILINOGEN 1.0 10/20/2013 1247   NITRITE NEGATIVE 09/05/2016 0824   LEUKOCYTESUR NEGATIVE 09/05/2016 0824   Sepsis Labs: @LABRCNTIP (procalcitonin:4,lacticidven:4)  )No results found for this or any previous visit (from the past 240 hour(s)).    Radiology Studies: Dg Chest 2 View  Result Date: 09/05/2016 CLINICAL DATA:  Leukocytosis, history GERD, former smoker EXAM: CHEST  2 VIEW COMPARISON:  08/30/2016 FINDINGS: LEFT jugular central venous catheter with tip projecting over SVC. Intraspinal stimulator present. Normal heart size, mediastinal contours, and pulmonary vascularity. Decreased RIGHT basilar atelectasis. Lungs otherwise clear. No pleural effusion or pneumothorax. Prior cervical spine fusion. IMPRESSION: Decreased RIGHT basilar atelectasis. Electronically Signed   By: Lavonia Dana M.D.   On: 09/05/2016 09:10     Scheduled Meds: . amLODipine  5 mg Oral Daily  . aspirin EC  81 mg Oral Daily  . calcium acetate  667 mg Oral TID WC  . docusate sodium  100 mg Oral BID  . feeding supplement (NEPRO CARB STEADY)  237 mL Oral BID BM  . heparin subcutaneous  5,000  Units Subcutaneous Q8H  .  hydrALAZINE  25 mg Oral BID  . levothyroxine  75 mcg Oral QAC breakfast  . mouth rinse  15 mL Mouth Rinse BID  . metoprolol tartrate  50 mg Oral BID  . multivitamin  1 tablet Oral QHS  . OLANZapine  7.5 mg Oral QHS  . pantoprazole  40 mg Oral Daily  . sodium chloride flush  10-40 mL Intracatheter Q12H  . sodium chloride flush  10-40 mL Intracatheter Q12H  . sodium chloride flush  3 mL Intravenous Q12H   Continuous Infusions:   LOS: 13 days   Time Spent in minutes   30 minutes  Effie Janoski D.O. on 09/05/2016 at 11:29 AM  Between 7am to 7pm - Pager - [phone]  After 7pm go to [email or web address] - password [email or web address]  And look for the night coverage person covering for me after hours  [facility] Office  [phone]

## 2016-09-05 NOTE — Progress Notes (Signed)
Subjective: Interval History: has no complaint .  Objective: Vital signs in last 24 hours: Temp:  [98 F (36.7 C)-98.6 F (37 C)] 98 F (36.7 C) (04/06 0823) Pulse Rate:  [77-84] 77 (04/06 0823) Resp:  [16-18] 16 (04/06 0823) BP: (138-163)/(54-86) 138/82 (04/06 0823) SpO2:  [93 %-99 %] 99 % (04/06 0823) Weight change:   Intake/Output from previous day: 04/05 0701 - 04/06 0700 In: 920 [P.O.:920] Out: 2175 [Urine:2175] Intake/Output this shift: Total I/O In: 480 [P.O.:480] Out: -   General appearance: alert, cooperative, mildly obese and pale Neck: LIJ cath Resp: clear to auscultation bilaterally Cardio: S1, S2 normal and systolic murmur: holosystolic 2/6, blowing at apex GI: obese,soft, pos bs, liver down5 cm Extremities: extremities normal, atraumatic, no cyanosis or edema  Lab Results:  Recent Labs  09/04/16 0500 09/05/16 0511  WBC 12.9* 13.9*  HGB 9.7* 9.7*  HCT 29.4* 29.3*  PLT 364 406*   BMET:  Recent Labs  09/04/16 0500 09/05/16 0511  NA 134* 133*  K 3.7 3.8  CL 95* 94*  CO2 27 24  GLUCOSE 90 86  BUN 59* 65*  CREATININE 7.23* 7.74*  CALCIUM 9.1 9.5   No results for input(s): PTH in the last 72 hours. Iron Studies: No results for input(s): IRON, TIBC, TRANSFERRIN, FERRITIN in the last 72 hours.  Studies/Results: Dg Chest 2 View  Result Date: 09/05/2016 CLINICAL DATA:  Leukocytosis, history GERD, former smoker EXAM: CHEST  2 VIEW COMPARISON:  08/30/2016 FINDINGS: LEFT jugular central venous catheter with tip projecting over SVC. Intraspinal stimulator present. Normal heart size, mediastinal contours, and pulmonary vascularity. Decreased RIGHT basilar atelectasis. Lungs otherwise clear. No pleural effusion or pneumothorax. Prior cervical spine fusion. IMPRESSION: Decreased RIGHT basilar atelectasis. Electronically Signed   By: Lavonia Dana M.D.   On: 09/05/2016 09:10    I have reviewed the patient's current medications.  Assessment/Plan: 1 AKI rate  of Cr rise less, not indic for HD today, hopefully recovering. 2 Anemia  3 Bipolar 4 Rhagdo 5 Schizo 6 Drug OD P follow urine, vol, bp,chem, needs long term psych help    LOS: 13 days   Mireille Lacombe L 09/05/2016,11:44 AM

## 2016-09-05 NOTE — Progress Notes (Signed)
qPhysical Therapy Treatment Patient Details Name: [name] MRN: [id number] DOB: 11/25/1965 Today's Date: 09/05/2016    History of Present Illness [name] is a 51 y/o man admitted in the early morning of 08/23/16 with rhabdomyolysis after an ingestion (wife thinks it was unintentional).  Over the course of his admission his renal function continued to decline and he developed worsening azotemia with altered mental status requiring dialysis.  He then developed increasing hypoxia thought to be due to fluid overload and he was emergently intubated. Extubated 08/27/16. PMH: chronic back pain, left tibial plateau fx, bipolar do, fibromyalgia, CHI x3    PT Comments    Pt performed increased activity and reviewed and completed standing exercises.  Therapeutic exercise performed to improve strength and promote functional mobility.     Follow Up Recommendations  Home health PT;Supervision - Intermittent     Equipment Recommendations  None recommended by PT    Recommendations for Other Services       Precautions / Restrictions Precautions Precautions: Fall Precaution Comments: has fallen since admission Restrictions Weight Bearing Restrictions: No    Mobility  Bed Mobility Overal bed mobility: Modified Independent Bed Mobility: Supine to Sit     Supine to sit: Modified independent (Device/Increase time)     General bed mobility comments: No assist needed.  Transfers Overall transfer level: Needs assistance Equipment used: Rolling walker (2 wheeled) Transfers: Sit to/from Stand Sit to Stand: Modified independent (Device/Increase time) Stand pivot transfers: Modified independent (Device/Increase time)       General transfer comment: Good technique  Ambulation/Gait Ambulation/Gait assistance: Supervision Ambulation Distance (Feet): 230 Feet Assistive device: Rolling walker (2 wheeled) Gait Pattern/deviations: Step-through pattern;Trunk flexed Gait velocity:  decreased Gait velocity interpretation: Below normal speed for age/gender General Gait Details: VC for upright posture, correct height of RW.  Cervical spine remains flexed with cues to correct.     Stairs            Wheelchair Mobility    Modified Rankin (Stroke Patients Only)       Balance     Sitting balance-Leahy Scale: Good       Standing balance-Leahy Scale: Fair Standing balance comment: requires upper extremity support to maintain                            Cognition Arousal/Alertness: Awake/alert Behavior During Therapy: Flat affect;WFL for tasks assessed/performed Overall Cognitive Status: Within Functional Limits for tasks assessed                                        Exercises General Exercises - Lower Extremity Hip ABduction/ADduction: AROM;Both;10 reps;Standing Hip Flexion/Marching: AROM;Both;10 reps;Standing Heel Raises: AROM;Both;10 reps;Standing Mini-Sqauts: AROM;Both;10 reps;Standing Other Exercises Other Exercises: Performed all standing exercises at sink with hands on counter for support.  PTA issued HEP to patient.  Edcuated on frequency and correct technique.      General Comments        Pertinent Vitals/Pain Pain Assessment: No/denies pain Faces Pain Scale: Hurts little more Pain Location: back pain, chronic Pain Descriptors / Indicators: Aching Pain Intervention(s): Limited activity within patient's tolerance    Home Living                      Prior Function            PT  Goals (current goals can now be found in the care plan section) Acute Rehab PT Goals Patient Stated Goal: return home Potential to Achieve Goals: Good Progress towards PT goals: Progressing toward goals    Frequency    Min 3X/week      PT Plan Current plan remains appropriate    Co-evaluation             End of Session   Activity Tolerance: Patient tolerated treatment well Patient left: Other  (comment);with call bell/phone within reach (sitting on commode) Nurse Communication: Mobility status PT Visit Diagnosis: Unsteadiness on feet (R26.81);Muscle weakness (generalized) (M62.81)     Time: 1859-0931 PT Time Calculation (min) (ACUTE ONLY): 20 min  Charges:  $Gait Training: 8-22 mins                    G Codes:       [name], PTA pager [place] 09/05/2016, 12:21 PM

## 2016-09-06 LAB — RENAL FUNCTION PANEL
ALBUMIN: 2.5 g/dL — AB (ref 3.5–5.0)
ANION GAP: 16 — AB (ref 5–15)
BUN: 73 mg/dL — ABNORMAL HIGH (ref 6–20)
CO2: 25 mmol/L (ref 22–32)
Calcium: 10.3 mg/dL (ref 8.9–10.3)
Chloride: 94 mmol/L — ABNORMAL LOW (ref 101–111)
Creatinine, Ser: 7.71 mg/dL — ABNORMAL HIGH (ref 0.61–1.24)
GFR, EST AFRICAN AMERICAN: 8 mL/min — AB (ref >60)
GFR, EST NON AFRICAN AMERICAN: 7 mL/min — AB (ref >60)
Glucose, Bld: 88 mg/dL (ref 65–99)
PHOSPHORUS: 8.8 mg/dL — AB (ref 2.5–4.6)
Potassium: 3.8 mmol/L (ref 3.5–5.1)
Sodium: 135 mmol/L (ref 135–145)

## 2016-09-06 LAB — CBC
HCT: 29.2 % — ABNORMAL LOW (ref 39.0–52.0)
Hemoglobin: 9.7 g/dL — ABNORMAL LOW (ref 13.0–17.0)
MCH: 27.7 pg (ref 26.0–34.0)
MCHC: 33.2 g/dL (ref 30.0–36.0)
MCV: 83.4 fL (ref 78.0–100.0)
PLATELETS: 466 10*3/uL — AB (ref 150–400)
RBC: 3.5 MIL/uL — AB (ref 4.22–5.81)
RDW: 12.8 % (ref 11.5–15.5)
WBC: 11.1 10*3/uL — AB (ref 4.0–10.5)

## 2016-09-06 MED ORDER — PROMETHAZINE HCL 25 MG PO TABS: 25.0000 mg | ORAL_TABLET | Freq: Four times a day (QID) | ORAL | Status: DC | PRN

## 2016-09-06 MED ORDER — CALCIUM ACETATE (PHOS BINDER) 667 MG PO CAPS
1334.0000 mg | ORAL_CAPSULE | Freq: Three times a day (TID) | ORAL | Status: DC
  Administered 2016-09-06 – 2016-09-09 (×9): 1334 mg via ORAL
  Filled 2016-09-06 (×9): qty 2

## 2016-09-06 MED ORDER — AMLODIPINE BESYLATE 5 MG PO TABS
5.0000 mg | ORAL_TABLET | Freq: Every day | ORAL | Status: DC
  Administered 2016-09-07 – 2016-09-08 (×2): 5 mg via ORAL
  Filled 2016-09-06 (×2): qty 1

## 2016-09-06 NOTE — Progress Notes (Addendum)
PROGRESS NOTE    [name]  UYQ:[id number] DOB: [birth year] DOA: 08/23/2016 PCP: Isaias Cowman, PA-C   No chief complaint on file.  Brief Narrative:  HPI on 08/23/2016 by Dr. Lily Kocher [name] is a 51 y.o. gentleman with a history of Bipolar disorder, schizophrenia, chronic pain from his neck and back, and hypothyroidism who has a history of unintentional drug overdose and two prior episodes of rhadomyolysis causing acute kidney injury.  The patient presented to the outside hospital yesterday after being found down at home by his wife with altered mental status and generalized weakness.  No nausea or vomiting noted.  No bowel or bladder incontinence noted.  No convulsions noted.  He was taken to the ED at [facility] for emergent evaluation. The patient has a spine stimulator for management of his chronic pain.  His wife also reports recent changes to his pain medication (increase in dose), and she suspects that increased use of the pain medication precipitated confusion, which contributed to him mismanaging his other medications.  However, she reports refills of his medications approximately one week ago and his Zyprexa and vistaril bottles are now empty, and his gabapentin and buspar bottles are "half full".  The patient's wife does not think that he was trying to hurt himself.  Interim history  HD initiated 08/24/16.  Over the course of his admission his renal function continued to decline and he developed worsening azotemia with altered mental status requiring dialysis. He then developed increasing hypoxia thought to be due to fluid overload and he was emergently intubated.  He was seen by PCCM.  TRH assumed care 3/30.   Assessment & Plan   ATN with Acute Renal Failure with uremia and AMS  -Secondary to overdose/rhabdo -nephrology consulted and appreciated -Stated HD on 08/24/16 -S/p TLC replaced 3/29 from fem to IJ.  -Creatinine slow to improve.  -Continue to monitor  intake/output, BMP, Creatinine and stable now  -Discussed with Dr. Jimmy Footman, will continue to monitor creatinine, likely until 4/9. Getting better urine output significantly better  Rhabdomyolysis  -Resolved now but significantly damaged patient's kidney function.   Schizophrenia / Bipolar disorder with Drug ingestion overdose   -Psychiatry consulted appreciated, seen on 08/28/2016. Currently does not meet criteria for inpatient psychiatric hospitalization and will receive outpatient resources for follow-up. -Catheter medications have been held due to renal function. -Resumed olanzapine 7.5 mg QHS and hydroxyzine PRN anxiety on 4/1.   -He needs to get back on his other mood stabilizers as soon as possible but because of his renal function it is likely not safe to restart it at this time. Reassess renal function and restart them as it is safe to do so. Assess mental health daily and reconsult psychiatry if needed.    Hypokalemia -Resolved, continue to monitor BMP  Chronic back pain / opioid dependence  -pain is controlled on current regimen, continue as needed  Chronic constipation -Patient has had bowel movements, will continue to monitor closely -Continued scheduled stool softeners   Chest Pain  -Resolved now.  His troponin tests have been negative.   -His chest pain resolved but having more upper respiratory symptoms which is being treated.   Aspiration  -Pt had projectile vomiting episode 3/30 and now having thick yellow sputum production.  -Improving -CXR  showed persistent right lung base atelectasis, no new abnormalities  -Continue aspiration precautions, incentive spirometry, nebulizer treatments  Leukocytosis  -WBC 13.9, no current source of infection. Possibly reactive -Chest x-ray and UA obtained,  unremarkable for infection -Continue to monitor   GERD  -Continue protonix (prophylaxis)  Essential hypertension  -Continue amlodipine  Deconditioning -Likely  secondary to the above and prolonged hospital stay. -PT consulted and recommended CIR. CIR recommended home health services.  DVT Prophylaxis  heparin  Code Status: Full  Family Communication: None at bedside. Wife via phone  Disposition Plan: Admitted. Pending improvement in patient's renal function. Patient will need home health upon discharge  Consultants Nephrology PCCM Inpatient Rehab Psychiatry   Procedures  Insertion of HD catheter - Right IJ 3/24 Right femoral temp HD catheter, US guided Intubation/Extubation  Antibiotics   Anti-infectives    Start     Dose/Rate Route Frequency Ordered Stop   08/29/16 1000  doxycycline (VIBRA-TABS) tablet 100 mg     100 mg Oral Every 12 hours 08/29/16 0852 09/05/16 4097      Subjective:   [name] seen and examined today. He has any nausea or vomiting. No shortness of breath as well.  Objective:   Vitals:   09/06/16 0510 09/06/16 0608 09/06/16 0906 09/06/16 1418  BP: 134/68  130/70 123/75  Pulse: 74  76 73  Resp: 16   18  Temp: 98.2 F (36.8 C)   98.1 F (36.7 C)  TempSrc: Oral   Oral  SpO2: 97%   95%  Weight:  106 kg (233 lb 11 oz)    Height:        Intake/Output Summary (Last 24 hours) at 09/06/16 1545 Last data filed at 09/06/16 1419  Gross per 24 hour  Intake              462 ml  Output             2826 ml  Net            -2364 ml   Filed Weights   09/04/16 0600 09/05/16 1300 09/06/16 0608  Weight: 107 kg (235 lb 14.3 oz) 107.6 kg (237 lb 3.4 oz) 106 kg (233 lb 11 oz)    Exam  General: Well developed, well nourished, NAD  HEENT: NCAT, mucous membranes moist.   Neck: Supple, L IJ   Cardiovascular: S1 S2 auscultated, 2/6SEM, RRR  Respiratory:Diminished but clear  Abdomen: Soft, obese, nontender, nondistended, + bowel sounds  Extremities: warm dry without cyanosis clubbing or edema  Neuro: AAOx3, nonfocal  Psych:Appropriate mood and affect, pleasant   Data Reviewed: I have personally  reviewed following labs and imaging studies  CBC:  Recent Labs Lab 09/02/16 1931 09/03/16 0546 09/04/16 0500 09/05/16 0511 09/06/16 0420  WBC 10.3 11.2* 12.9* 13.9* 11.1*  HGB 10.4* 9.9* 9.7* 9.7* 9.7*  HCT 30.3* 29.3* 29.4* 29.3* 29.2*  MCV 82.3 83.0 83.5 83.5 83.4  PLT 290 275 364 406* 353*   Basic Metabolic Panel:  Recent Labs Lab 09/02/16 0357 09/03/16 0546 09/04/16 0500 09/05/16 0511 09/06/16 0420  NA 134* 133* 134* 133* 135  K 4.0 3.8 3.7 3.8 3.8  CL 94* 96* 95* 94* 94*  CO2 25 26 27 24 25   GLUCOSE 85 92 90 86 88  BUN 74* 52* 59* 65* 73*  CREATININE 8.41* 6.30* 7.23* 7.74* 7.71*  CALCIUM 8.7* 8.7* 9.1 9.5 10.3  PHOS 6.1* 5.4* 6.4* 7.5* 8.8*   GFR: Estimated Creatinine Clearance: 14.6 mL/min (A) (by C-G formula based on SCr of 7.71 mg/dL (H)). Liver Function Tests:  Recent Labs Lab 09/02/16 0357 09/03/16 0546 09/04/16 0500 09/05/16 0511 09/06/16 0420  ALBUMIN 2.3* 2.4* 2.4* 2.4* 2.5*  No results for input(s): LIPASE, AMYLASE in the last 168 hours. No results for input(s): AMMONIA in the last 168 hours. Coagulation Profile: No results for input(s): INR, PROTIME in the last 168 hours. Cardiac Enzymes:  Recent Labs Lab 08/31/16 0505  CKTOTAL 214   BNP (last 3 results) No results for input(s): PROBNP in the last 8760 hours. HbA1C: No results for input(s): HGBA1C in the last 72 hours. CBG:  Recent Labs Lab 09/04/16 1218  GLUCAP 112*   Lipid Profile: No results for input(s): CHOL, HDL, LDLCALC, TRIG, CHOLHDL, LDLDIRECT in the last 72 hours. Thyroid Function Tests: No results for input(s): TSH, T4TOTAL, FREET4, T3FREE, THYROIDAB in the last 72 hours. Anemia Panel: No results for input(s): VITAMINB12, FOLATE, FERRITIN, TIBC, IRON, RETICCTPCT in the last 72 hours. Urine analysis:    Component Value Date/Time   COLORURINE STRAW (A) 09/05/2016 0824   APPEARANCEUR CLEAR 09/05/2016 0824   LABSPEC 1.005 09/05/2016 0824   PHURINE 7.0  09/05/2016 0824   GLUCOSEU NEGATIVE 09/05/2016 0824   HGBUR SMALL (A) 09/05/2016 0824   BILIRUBINUR NEGATIVE 09/05/2016 0824   KETONESUR NEGATIVE 09/05/2016 0824   PROTEINUR NEGATIVE 09/05/2016 0824   UROBILINOGEN 1.0 10/20/2013 1247   NITRITE NEGATIVE 09/05/2016 0824   LEUKOCYTESUR NEGATIVE 09/05/2016 0824   Sepsis Labs: @LABRCNTIP (procalcitonin:4,lacticidven:4)  )No results found for this or any previous visit (from the past 240 hour(s)).    Radiology Studies: Dg Chest 2 View  Result Date: 09/05/2016 CLINICAL DATA:  Leukocytosis, history GERD, former smoker EXAM: CHEST  2 VIEW COMPARISON:  08/30/2016 FINDINGS: LEFT jugular central venous catheter with tip projecting over SVC. Intraspinal stimulator present. Normal heart size, mediastinal contours, and pulmonary vascularity. Decreased RIGHT basilar atelectasis. Lungs otherwise clear. No pleural effusion or pneumothorax. Prior cervical spine fusion. IMPRESSION: Decreased RIGHT basilar atelectasis. Electronically Signed   By: Lavonia Dana M.D.   On: 09/05/2016 09:10     Scheduled Meds: . [START ON 09/07/2016] amLODipine  5 mg Oral QHS  . aspirin EC  81 mg Oral Daily  . calcium acetate  1,334 mg Oral TID WC  . docusate sodium  100 mg Oral BID  . feeding supplement (NEPRO CARB STEADY)  237 mL Oral BID BM  . heparin subcutaneous  5,000 Units Subcutaneous Q8H  . levothyroxine  75 mcg Oral QAC breakfast  . mouth rinse  15 mL Mouth Rinse BID  . metoprolol tartrate  50 mg Oral BID  . multivitamin  1 tablet Oral QHS  . OLANZapine  7.5 mg Oral QHS  . pantoprazole  40 mg Oral Daily  . sodium chloride flush  10-40 mL Intracatheter Q12H  . sodium chloride flush  10-40 mL Intracatheter Q12H  . sodium chloride flush  3 mL Intravenous Q12H   Continuous Infusions:   LOS: 14 days   Time Spent in minutes   30 minutes  Author:  Berle Mull, MD [facility] Pager: [phone] 09/06/2016 3:47 PM     After 7pm go to [email or web address] -  password [email or web address]  And look for the night coverage person covering for me after hours  [facility] Office  [phone]

## 2016-09-06 NOTE — Progress Notes (Signed)
Subjective: Interval History: has complaints still N at times, does fine with Nepro.  Objective: Vital signs in last 24 hours: Temp:  [97.9 F (36.6 C)-98.2 F (36.8 C)] 98.2 F (36.8 C) (04/07 0510) Pulse Rate:  [72-77] 76 (04/07 0906) Resp:  [16-17] 16 (04/07 0510) BP: (126-144)/(68-79) 130/70 (04/07 0906) SpO2:  [93 %-98 %] 97 % (04/07 0510) Weight:  [106 kg (233 lb 11 oz)-107.6 kg (237 lb 3.4 oz)] 106 kg (233 lb 11 oz) (04/07 3748) Weight change:   Intake/Output from previous day: 04/06 0701 - 04/07 0700 In: 1662 [P.O.:1662] Out: 2301 [Urine:2300; Stool:1] Intake/Output this shift: Total I/O In: 120 [P.O.:120] Out: 400 [Urine:400]  General appearance: alert, cooperative, no distress, mildly obese and pale Neck: L IJ cath Resp: clear to auscultation bilaterally Cardio: S1, S2 normal and systolic murmur: holosystolic 2/6, blowing at apex GI: obese, pos bs, soft Extremities: extremities normal, atraumatic, no cyanosis or edema  Lab Results:  Recent Labs  09/05/16 0511 09/06/16 0420  WBC 13.9* 11.1*  HGB 9.7* 9.7*  HCT 29.3* 29.2*  PLT 406* 466*   BMET:  Recent Labs  09/05/16 0511 09/06/16 0420  NA 133* 135  K 3.8 3.8  CL 94* 94*  CO2 24 25  GLUCOSE 86 88  BUN 65* 73*  CREATININE 7.74* 7.71*  CALCIUM 9.5 10.3   No results for input(s): PTH in the last 72 hours. Iron Studies: No results for input(s): IRON, TIBC, TRANSFERRIN, FERRITIN in the last 72 hours.  Studies/Results: Dg Chest 2 View  Result Date: 09/05/2016 CLINICAL DATA:  Leukocytosis, history GERD, former smoker EXAM: CHEST  2 VIEW COMPARISON:  08/30/2016 FINDINGS: LEFT jugular central venous catheter with tip projecting over SVC. Intraspinal stimulator present. Normal heart size, mediastinal contours, and pulmonary vascularity. Decreased RIGHT basilar atelectasis. Lungs otherwise clear. No pleural effusion or pneumothorax. Prior cervical spine fusion. IMPRESSION: Decreased RIGHT basilar  atelectasis. Electronically Signed   By: Lavonia Dana M.D.   On: 09/05/2016 09:10    I have reviewed the patient's current medications.  Assessment/Plan: 1 AKI/Rhabdo  Stable Cr, good urine vol..  Some uremic sx. Will hold off Hd, if rises resume. 2 Anemia esa 3 HTN do not lower bp xs 4 Drug OD 5 Schizo/Bipolar P follow chem, phenergan as needed, Nepro    LOS: 14 days   Danielys Madry L 09/06/2016,9:59 AM

## 2016-09-07 DIAGNOSIS — J9602 Acute respiratory failure with hypercapnia: Secondary | ICD-10-CM | POA: Diagnosis not present

## 2016-09-07 DIAGNOSIS — N170 Acute kidney failure with tubular necrosis: Secondary | ICD-10-CM | POA: Diagnosis not present

## 2016-09-07 DIAGNOSIS — M6282 Rhabdomyolysis: Secondary | ICD-10-CM | POA: Diagnosis not present

## 2016-09-07 DIAGNOSIS — J9601 Acute respiratory failure with hypoxia: Secondary | ICD-10-CM | POA: Diagnosis not present

## 2016-09-07 DIAGNOSIS — J95851 Ventilator associated pneumonia: Secondary | ICD-10-CM | POA: Diagnosis not present

## 2016-09-07 DIAGNOSIS — E872 Acidosis: Secondary | ICD-10-CM | POA: Diagnosis not present

## 2016-09-07 DIAGNOSIS — E874 Mixed disorder of acid-base balance: Secondary | ICD-10-CM | POA: Diagnosis not present

## 2016-09-07 DIAGNOSIS — S36119A Unspecified injury of liver, initial encounter: Secondary | ICD-10-CM | POA: Diagnosis not present

## 2016-09-07 DIAGNOSIS — F1120 Opioid dependence, uncomplicated: Secondary | ICD-10-CM | POA: Diagnosis not present

## 2016-09-07 LAB — RENAL FUNCTION PANEL
Albumin: 2.5 g/dL — ABNORMAL LOW (ref 3.5–5.0)
Anion gap: 14 (ref 5–15)
BUN: 75 mg/dL — ABNORMAL HIGH (ref 6–20)
CALCIUM: 11.3 mg/dL — AB (ref 8.9–10.3)
CO2: 26 mmol/L (ref 22–32)
CREATININE: 7.53 mg/dL — AB (ref 0.61–1.24)
Chloride: 94 mmol/L — ABNORMAL LOW (ref 101–111)
GFR calc non Af Amer: 8 mL/min — ABNORMAL LOW (ref >60)
GFR, EST AFRICAN AMERICAN: 9 mL/min — AB (ref >60)
Glucose, Bld: 97 mg/dL (ref 65–99)
Phosphorus: 8.6 mg/dL — ABNORMAL HIGH (ref 2.5–4.6)
Potassium: 3.7 mmol/L (ref 3.5–5.1)
SODIUM: 134 mmol/L — AB (ref 135–145)

## 2016-09-07 MED ORDER — METOCLOPRAMIDE HCL 5 MG/ML IJ SOLN
10.0000 mg | Freq: Three times a day (TID) | INTRAMUSCULAR | Status: DC | PRN
Start: 2016-09-07 — End: 2016-09-09
  Filled 2016-09-07: qty 2

## 2016-09-07 NOTE — Progress Notes (Signed)
Subjective: Interval History: has complaints still poor appetite.  Objective: Vital signs in last 24 hours: Temp:  [98.1 F (36.7 C)-98.2 F (36.8 C)] 98.2 F (36.8 C) (04/08 0514) Pulse Rate:  [68-76] 68 (04/08 0811) Resp:  [18-19] 19 (04/08 0514) BP: (123-143)/(75-84) 138/80 (04/08 0811) SpO2:  [95 %-99 %] 95 % (04/08 0514) Weight:  [106.2 kg (234 lb 2.1 oz)] 106.2 kg (234 lb 2.1 oz) (04/08 0628) Weight change: -1.4 kg (-3 lb 1.4 oz)  Intake/Output from previous day: 04/07 0701 - 04/08 0700 In: 240 [P.O.:240] Out: 1600 [Urine:1600] Intake/Output this shift: No intake/output data recorded.  General appearance: cooperative, moderately obese, pale and slowed mentation Neck: IJ cath Resp: IJ cath Cardio: S1, S2 normal and systolic murmur: holosystolic 2/6, blowing at apex GI: obese, soft, pos bs Extremities: edema tr  Lab Results:  Recent Labs  09/05/16 0511 09/06/16 0420  WBC 13.9* 11.1*  HGB 9.7* 9.7*  HCT 29.3* 29.2*  PLT 406* 466*   BMET:  Recent Labs  09/06/16 0420 09/07/16 0456  NA 135 134*  K 3.8 3.7  CL 94* 94*  CO2 25 26  GLUCOSE 88 97  BUN 73* 75*  CREATININE 7.71* 7.53*  CALCIUM 10.3 11.3*   No results for input(s): PTH in the last 72 hours. Iron Studies: No results for input(s): IRON, TIBC, TRANSFERRIN, FERRITIN in the last 72 hours.  Studies/Results: No results found.  I have reviewed the patient's current medications.  Assessment/Plan: 1 AKI/Rhabdo nonoliguric,vol ok. Cr starting to fall.Marland Kitchen Hopefully early recovery 2 anemia 3 Schizo 4 Drug OD 5 Bipolar 6 obesity P follow chem , if improves d/c HD cath in 48h    LOS: 15 days   Macky Galik L 09/07/2016,10:03 AM

## 2016-09-07 NOTE — Progress Notes (Signed)
PROGRESS NOTE    [name]  BWL:[id number] DOB: 1966-04-15 DOA: 08/23/2016 PCP: Isaias Cowman, PA-C   No chief complaint on file.  Brief Narrative:  HPI on 08/23/2016 by Dr. Lily Kocher [name] is a 51 y.o. gentleman with a history of Bipolar disorder, schizophrenia, chronic pain from his neck and back, and hypothyroidism who has a history of unintentional drug overdose and two prior episodes of rhadomyolysis causing acute kidney injury.  The patient presented to the outside hospital yesterday after being found down at home by his wife with altered mental status and generalized weakness.  No nausea or vomiting noted.  No bowel or bladder incontinence noted.  No convulsions noted.  He was taken to the ED at [facility] for emergent evaluation. The patient has a spine stimulator for management of his chronic pain.  His wife also reports recent changes to his pain medication (increase in dose), and she suspects that increased use of the pain medication precipitated confusion, which contributed to him mismanaging his other medications.  However, she reports refills of his medications approximately one week ago and his Zyprexa and vistaril bottles are now empty, and his gabapentin and buspar bottles are "half full".  The patient's wife does not think that he was trying to hurt himself.  Interim history  HD initiated 08/24/16.  Over the course of his admission his renal function continued to decline and he developed worsening azotemia with altered mental status requiring dialysis. He then developed increasing hypoxia thought to be due to fluid overload and he was emergently intubated.  He was seen by PCCM.  TRH assumed care 3/30.   Assessment & Plan   ATN with Acute Renal Failure with uremia and AMS  -Secondary to overdose/rhabdo -nephrology consulted and appreciated -Stated HD on 08/24/16 -S/p TLC replaced 3/29 from fem to IJ.  -Creatinine slow to improve.  -Continue to monitor  intake/output, BMP, Creatinine and stable now  -Discussed with Dr. Jimmy Footman, will continue to monitor creatinine, likely until 4/9. Today has significant nausea as well as episodes of vomiting, no evidence of obstruction, ?uremia , otherwise urine output significantly better  Rhabdomyolysis  -Resolved now but significantly damaged patient's kidney function.   Schizophrenia / Bipolar disorder with Drug ingestion overdose   -Psychiatry consulted appreciated, seen on 08/28/2016. Currently does not meet criteria for inpatient psychiatric hospitalization and will receive outpatient resources for follow-up. -Catheter medications have been held due to renal function. -Resumed olanzapine 7.5 mg QHS and hydroxyzine PRN anxiety on 4/1.   -He needs to get back on his other mood stabilizers as soon as possible but because of his renal function it is likely not safe to restart it at this time. Reassess renal function and restart them as it is safe to do so. Assess mental health daily and reconsult psychiatry if needed.    Hypokalemia -Resolved, continue to monitor BMP  Chronic back pain / opioid dependence  -pain is controlled on current regimen, continue as needed  Chronic constipation -Patient has had bowel movements, will continue to monitor closely -Continued scheduled stool softeners   Chest Pain  -Resolved now.  His troponin tests have been negative.   -His chest pain resolved but having more upper respiratory symptoms which is being treated.   Aspiration  -Pt had projectile vomiting episode 3/30 and now having thick yellow sputum production.  -Improving -CXR  showed persistent right lung base atelectasis, no new abnormalities  -Continue aspiration precautions, incentive spirometry, nebulizer treatments  Leukocytosis  -  WBC 13.9, no current source of infection. Possibly reactive -Chest x-ray and UA obtained, unremarkable for infection -Continue to monitor   GERD  -Continue  protonix (prophylaxis)  Essential hypertension  -Continue amlodipine  Deconditioning -Likely secondary to the above and prolonged hospital stay. -PT consulted and recommended CIR. CIR recommended home health services.  DVT Prophylaxis  heparin  Code Status: Full  Family Communication: None at bedside. Wife via phone  Disposition Plan: Admitted. Pending improvement in patient's renal function. Patient will need home health upon discharge  Consultants Nephrology PCCM Inpatient Rehab Psychiatry   Procedures  Insertion of HD catheter - Right IJ 3/24 Right femoral temp HD catheter, US guided Intubation/Extubation  Antibiotics   Anti-infectives    Start     Dose/Rate Route Frequency Ordered Stop   08/29/16 1000  doxycycline (VIBRA-TABS) tablet 100 mg     100 mg Oral Every 12 hours 08/29/16 0852 09/05/16 4174      Subjective:   [name] seen and examined today. Multiple episodes of nausea, one or 2 episodes of vomiting as well. No abdominal pain. Passing gas. Having bowel movements.  Objective:   Vitals:   09/07/16 0514 09/07/16 0628 09/07/16 0811 09/07/16 1552  BP: (!) 143/79  138/80 117/61  Pulse: 72  68 89  Resp: 19   18  Temp: 98.2 F (36.8 C)   98.3 F (36.8 C)  TempSrc:      SpO2: 95%   96%  Weight:  106.2 kg (234 lb 2.1 oz)    Height:        Intake/Output Summary (Last 24 hours) at 09/07/16 1754 Last data filed at 09/07/16 1555  Gross per 24 hour  Intake                0 ml  Output             1050 ml  Net            -1050 ml   Filed Weights   09/05/16 1300 09/06/16 0608 09/07/16 0628  Weight: 107.6 kg (237 lb 3.4 oz) 106 kg (233 lb 11 oz) 106.2 kg (234 lb 2.1 oz)    Exam  General: Well developed, well nourished, NAD  HEENT: NCAT, mucous membranes moist.   Neck: Supple, L IJ   Cardiovascular: S1 S2 auscultated, 2/6SEM, RRR  Respiratory:Diminished but clear  Abdomen: Soft, obese, nontender, nondistended, + bowel  sounds  Extremities: warm dry without cyanosis clubbing or edema  Neuro: AAOx3, nonfocal  Psych:Appropriate mood and affect, pleasant   Data Reviewed: I have personally reviewed following labs and imaging studies  CBC:  Recent Labs Lab 09/02/16 1931 09/03/16 0546 09/04/16 0500 09/05/16 0511 09/06/16 0420  WBC 10.3 11.2* 12.9* 13.9* 11.1*  HGB 10.4* 9.9* 9.7* 9.7* 9.7*  HCT 30.3* 29.3* 29.4* 29.3* 29.2*  MCV 82.3 83.0 83.5 83.5 83.4  PLT 290 275 364 406* 081*   Basic Metabolic Panel:  Recent Labs Lab 09/03/16 0546 09/04/16 0500 09/05/16 0511 09/06/16 0420 09/07/16 0456  NA 133* 134* 133* 135 134*  K 3.8 3.7 3.8 3.8 3.7  CL 96* 95* 94* 94* 94*  CO2 26 27 24 25 26   GLUCOSE 92 90 86 88 97  BUN 52* 59* 65* 73* 75*  CREATININE 6.30* 7.23* 7.74* 7.71* 7.53*  CALCIUM 8.7* 9.1 9.5 10.3 11.3*  PHOS 5.4* 6.4* 7.5* 8.8* 8.6*   GFR: Estimated Creatinine Clearance: 14.9 mL/min (A) (by C-G formula based on SCr of 7.53 mg/dL (  H)). Liver Function Tests:  Recent Labs Lab 09/03/16 0546 09/04/16 0500 09/05/16 0511 09/06/16 0420 09/07/16 0456  ALBUMIN 2.4* 2.4* 2.4* 2.5* 2.5*   No results for input(s): LIPASE, AMYLASE in the last 168 hours. No results for input(s): AMMONIA in the last 168 hours. Coagulation Profile: No results for input(s): INR, PROTIME in the last 168 hours. Cardiac Enzymes: No results for input(s): CKTOTAL, CKMB, CKMBINDEX, TROPONINI in the last 168 hours. BNP (last 3 results) No results for input(s): PROBNP in the last 8760 hours. HbA1C: No results for input(s): HGBA1C in the last 72 hours. CBG:  Recent Labs Lab 09/04/16 1218  GLUCAP 112*   Lipid Profile: No results for input(s): CHOL, HDL, LDLCALC, TRIG, CHOLHDL, LDLDIRECT in the last 72 hours. Thyroid Function Tests: No results for input(s): TSH, T4TOTAL, FREET4, T3FREE, THYROIDAB in the last 72 hours. Anemia Panel: No results for input(s): VITAMINB12, FOLATE, FERRITIN, TIBC, IRON,  RETICCTPCT in the last 72 hours. Urine analysis:    Component Value Date/Time   COLORURINE STRAW (A) 09/05/2016 0824   APPEARANCEUR CLEAR 09/05/2016 0824   LABSPEC 1.005 09/05/2016 0824   PHURINE 7.0 09/05/2016 0824   GLUCOSEU NEGATIVE 09/05/2016 0824   HGBUR SMALL (A) 09/05/2016 0824   BILIRUBINUR NEGATIVE 09/05/2016 0824   KETONESUR NEGATIVE 09/05/2016 0824   PROTEINUR NEGATIVE 09/05/2016 0824   UROBILINOGEN 1.0 10/20/2013 1247   NITRITE NEGATIVE 09/05/2016 0824   LEUKOCYTESUR NEGATIVE 09/05/2016 0824   Sepsis Labs: @LABRCNTIP (procalcitonin:4,lacticidven:4)  )No results found for this or any previous visit (from the past 240 hour(s)).    Radiology Studies: No results found.   Scheduled Meds: . amLODipine  5 mg Oral QHS  . aspirin EC  81 mg Oral Daily  . calcium acetate  1,334 mg Oral TID WC  . docusate sodium  100 mg Oral BID  . feeding supplement (NEPRO CARB STEADY)  237 mL Oral BID BM  . heparin subcutaneous  5,000 Units Subcutaneous Q8H  . levothyroxine  75 mcg Oral QAC breakfast  . mouth rinse  15 mL Mouth Rinse BID  . metoprolol tartrate  50 mg Oral BID  . multivitamin  1 tablet Oral QHS  . OLANZapine  7.5 mg Oral QHS  . pantoprazole  40 mg Oral Daily  . sodium chloride flush  10-40 mL Intracatheter Q12H  . sodium chloride flush  10-40 mL Intracatheter Q12H  . sodium chloride flush  3 mL Intravenous Q12H   Continuous Infusions:   LOS: 15 days   Time Spent in minutes   30 minutes  Author:  Berle Mull, MD [facility] Pager: [phone] 09/07/2016 5:54 PM     After 7pm go to [email or web address] - password [email or web address]  And look for the night coverage person covering for me after hours  [facility] Office  [phone]

## 2016-09-08 ENCOUNTER — Inpatient Hospital Stay (HOSPITAL_COMMUNITY): Payer: 59

## 2016-09-08 LAB — RENAL FUNCTION PANEL
Albumin: 3 g/dL — ABNORMAL LOW (ref 3.5–5.0)
Anion gap: 14 (ref 5–15)
BUN: 75 mg/dL — ABNORMAL HIGH (ref 6–20)
CALCIUM: 12.8 mg/dL — AB (ref 8.9–10.3)
CO2: 27 mmol/L (ref 22–32)
CREATININE: 7.48 mg/dL — AB (ref 0.61–1.24)
Chloride: 94 mmol/L — ABNORMAL LOW (ref 101–111)
GFR calc Af Amer: 9 mL/min — ABNORMAL LOW (ref >60)
GFR, EST NON AFRICAN AMERICAN: 8 mL/min — AB (ref >60)
Glucose, Bld: 103 mg/dL — ABNORMAL HIGH (ref 65–99)
Phosphorus: 8 mg/dL — ABNORMAL HIGH (ref 2.5–4.6)
Potassium: 3.6 mmol/L (ref 3.5–5.1)
SODIUM: 135 mmol/L (ref 135–145)

## 2016-09-08 LAB — CBC
HCT: 30.4 % — ABNORMAL LOW (ref 39.0–52.0)
Hemoglobin: 10.6 g/dL — ABNORMAL LOW (ref 13.0–17.0)
MCH: 29 pg (ref 26.0–34.0)
MCHC: 34.9 g/dL (ref 30.0–36.0)
MCV: 83.3 fL (ref 78.0–100.0)
PLATELETS: 571 10*3/uL — AB (ref 150–400)
RBC: 3.65 MIL/uL — AB (ref 4.22–5.81)
RDW: 12.5 % (ref 11.5–15.5)
WBC: 11.2 10*3/uL — AB (ref 4.0–10.5)

## 2016-09-08 MED ORDER — SODIUM CHLORIDE 0.45 % IV SOLN
INTRAVENOUS | Status: DC
  Administered 2016-09-08 – 2016-09-09 (×2): via INTRAVENOUS

## 2016-09-08 NOTE — Progress Notes (Signed)
PT Cancellation Note  Patient Details Name: [name] MRN: [id number] DOB: 05/12/1966   Cancelled Treatment:    Reason Eval/Treat Not Completed: Medical issues which prohibited therapy (Patient reports feeling nauseated despite medications,  pt lying in bed with emesis bag on chest.  Refused OOB mobility.  )   Orville Mena Eli Hose 09/08/2016, 12:36 PM  Governor [place]ook[place], PTA pager [phone]

## 2016-09-08 NOTE — Progress Notes (Addendum)
PROGRESS NOTE    [name]  NFA:[id number] DOB: 02-Oct-1965 DOA: 08/23/2016 PCP: Isaias Cowman, PA-C   No chief complaint on file.  Brief Narrative:  HPI on 08/23/2016 by Dr. Lily Kocher [name] is a 51 y.o. gentleman with a history of Bipolar disorder, schizophrenia, chronic pain from his neck and back, and hypothyroidism who has a history of unintentional drug overdose and two prior episodes of rhadomyolysis causing acute kidney injury.  The patient presented to the outside hospital yesterday after being found down at home by his wife with altered mental status and generalized weakness.  No nausea or vomiting noted.  No bowel or bladder incontinence noted.  No convulsions noted.  He was taken to the ED at [facility] for emergent evaluation. The patient has a spine stimulator for management of his chronic pain.  His wife also reports recent changes to his pain medication (increase in dose), and she suspects that increased use of the pain medication precipitated confusion, which contributed to him mismanaging his other medications.  However, she reports refills of his medications approximately one week ago and his Zyprexa and vistaril bottles are now empty, and his gabapentin and buspar bottles are "half full".  The patient's wife does not think that he was trying to hurt himself.  Interim history  HD initiated 08/24/16.  Over the course of his admission his renal function continued to decline and he developed worsening azotemia with altered mental status requiring dialysis. He then developed increasing hypoxia thought to be due to fluid overload and he was emergently intubated.  He was seen by PCCM.  TRH assumed care 3/30.   Assessment & Plan   ATN with Acute Renal Failure with uremia and AMS  -Secondary to overdose/rhabdo -nephrology consulted and appreciated -Stated HD on 08/24/16 -S/p TLC replaced 3/29 from fem to IJ.  -Creatinine slow to improve, currently 7.48 -patient did have  significant nausea as well as episodes of vomiting, no evidence of obstruction, ?uremia  -Urine output 1900cc over past 24 hours -Pending further recommendations from nephrology -Continue to monitor intake/output and BMP  Rhabdomyolysis  -Resolved now but significantly damaged patient's kidney function.   Schizophrenia / Bipolar disorder with Drug ingestion overdose   -Psychiatry consulted appreciated, seen on 08/28/2016. Currently does not meet criteria for inpatient psychiatric hospitalization and will receive outpatient resources for follow-up. -Catheter medications have been held due to renal function. -Resumed olanzapine 7.5 mg QHS and hydroxyzine PRN anxiety on 4/1.   -He needs to get back on his other mood stabilizers as soon as possible but because of his renal function it is likely not safe to restart it at this time. Reassess renal function and restart them as it is safe to do so. Assess mental health daily and reconsult psychiatry if needed.    Hypokalemia -Resolved, continue to monitor BMP  Chronic back pain / opioid dependence  -pain is controlled on current regimen, continue as needed  Chronic constipation -Patient has had bowel movements, will continue to monitor closely -Continued scheduled stool softeners   Chest Pain  -Resolved now.  His troponin tests have been negative.   -His chest pain resolved but having more upper respiratory symptoms which is being treated.   Aspiration  -Pt had projectile vomiting episode 3/30 and now having thick yellow sputum production.  -Improving -CXR  showed persistent right lung base atelectasis, no new abnormalities  -Continue aspiration precautions, incentive spirometry, nebulizer treatments  Leukocytosis  -WBC improved, 11.2, no current source  of infection. Possibly reactive -Chest x-ray and UA obtained, unremarkable for infection -Continue to monitor   GERD  -Continue protonix (prophylaxis)  Essential hypertension   -Continue amlodipine  Deconditioning -Likely secondary to the above and prolonged hospital stay. -PT consulted and recommended CIR. CIR recommended home health services.  DVT Prophylaxis  heparin  Code Status: Full  Family Communication: None at bedside.   Disposition Plan: Admitted. Pending improvement in patient's renal function. Patient will need home health upon discharge  Consultants Nephrology PCCM Inpatient Rehab Psychiatry   Procedures  Insertion of HD catheter - Right IJ 3/24 Right femoral temp HD catheter, US guided Intubation/Extubation  Antibiotics   Anti-infectives    Start     Dose/Rate Route Frequency Ordered Stop   08/29/16 1000  doxycycline (VIBRA-TABS) tablet 100 mg     100 mg Oral Every 12 hours 08/29/16 0852 09/05/16 5852      Subjective:   [name] seen and examined today.  Denies current nausea.  Wishes to go home. Denies chst pain, shortness of breath, abdominal pain.   Objective:   Vitals:   09/07/16 1552 09/07/16 2216 09/08/16 0607 09/08/16 1009  BP: 117/61 (!) 148/84 139/74 (!) 143/87  Pulse: 89 85 72 82  Resp: 18 17 18    Temp: 98.3 F (36.8 C) 99 F (37.2 C) 98.5 F (36.9 C)   TempSrc:  Oral Oral   SpO2: 96% 97% 94%   Weight:   103.7 kg (228 lb 9.9 oz)   Height:        Intake/Output Summary (Last 24 hours) at 09/08/16 1131 Last data filed at 09/08/16 1025  Gross per 24 hour  Intake              400 ml  Output             2575 ml  Net            -2175 ml   Filed Weights   09/06/16 0608 09/07/16 0628 09/08/16 0607  Weight: 106 kg (233 lb 11 oz) 106.2 kg (234 lb 2.1 oz) 103.7 kg (228 lb 9.9 oz)    Exam  General: Well developed, well nourished, NAD  HEENT: NCAT, mucous membranes moist.   Neck: Supple  Cardiovascular: S1 S2 auscultated, 2/6SEM, RRR  Respiratory:Diminished but clear  Abdomen: Soft, obese, nontender, nondistended, + bowel sounds  Extremities: warm dry without cyanosis clubbing or edema  Neuro:  AAOx3, nonfocal  Psych:Appropriate mood and affect, pleasant   Data Reviewed: I have personally reviewed following labs and imaging studies  CBC:  Recent Labs Lab 09/03/16 0546 09/04/16 0500 09/05/16 0511 09/06/16 0420 09/08/16 0447  WBC 11.2* 12.9* 13.9* 11.1* 11.2*  HGB 9.9* 9.7* 9.7* 9.7* 10.6*  HCT 29.3* 29.4* 29.3* 29.2* 30.4*  MCV 83.0 83.5 83.5 83.4 83.3  PLT 275 364 406* 466* 778*   Basic Metabolic Panel:  Recent Labs Lab 09/04/16 0500 09/05/16 0511 09/06/16 0420 09/07/16 0456 09/08/16 0447  NA 134* 133* 135 134* 135  K 3.7 3.8 3.8 3.7 3.6  CL 95* 94* 94* 94* 94*  CO2 27 24 25 26 27   GLUCOSE 90 86 88 97 103*  BUN 59* 65* 73* 75* 75*  CREATININE 7.23* 7.74* 7.71* 7.53* 7.48*  CALCIUM 9.1 9.5 10.3 11.3* 12.8*  PHOS 6.4* 7.5* 8.8* 8.6* 8.0*   GFR: Estimated Creatinine Clearance: 14.9 mL/min (A) (by C-G formula based on SCr of 7.48 mg/dL (H)). Liver Function Tests:  Recent Labs Lab 09/04/16 0500  09/05/16 0511 09/06/16 0420 09/07/16 0456 09/08/16 0447  ALBUMIN 2.4* 2.4* 2.5* 2.5* 3.0*   No results for input(s): LIPASE, AMYLASE in the last 168 hours. No results for input(s): AMMONIA in the last 168 hours. Coagulation Profile: No results for input(s): INR, PROTIME in the last 168 hours. Cardiac Enzymes: No results for input(s): CKTOTAL, CKMB, CKMBINDEX, TROPONINI in the last 168 hours. BNP (last 3 results) No results for input(s): PROBNP in the last 8760 hours. HbA1C: No results for input(s): HGBA1C in the last 72 hours. CBG:  Recent Labs Lab 09/04/16 1218  GLUCAP 112*   Lipid Profile: No results for input(s): CHOL, HDL, LDLCALC, TRIG, CHOLHDL, LDLDIRECT in the last 72 hours. Thyroid Function Tests: No results for input(s): TSH, T4TOTAL, FREET4, T3FREE, THYROIDAB in the last 72 hours. Anemia Panel: No results for input(s): VITAMINB12, FOLATE, FERRITIN, TIBC, IRON, RETICCTPCT in the last 72 hours. Urine analysis:    Component Value  Date/Time   COLORURINE STRAW (A) 09/05/2016 0824   APPEARANCEUR CLEAR 09/05/2016 0824   LABSPEC 1.005 09/05/2016 0824   PHURINE 7.0 09/05/2016 0824   GLUCOSEU NEGATIVE 09/05/2016 0824   HGBUR SMALL (A) 09/05/2016 0824   BILIRUBINUR NEGATIVE 09/05/2016 0824   KETONESUR NEGATIVE 09/05/2016 0824   PROTEINUR NEGATIVE 09/05/2016 0824   UROBILINOGEN 1.0 10/20/2013 1247   NITRITE NEGATIVE 09/05/2016 0824   LEUKOCYTESUR NEGATIVE 09/05/2016 0824   Sepsis Labs: @LABRCNTIP (procalcitonin:4,lacticidven:4)  )No results found for this or any previous visit (from the past 240 hour(s)).    Radiology Studies: No results found.   Scheduled Meds: . amLODipine  5 mg Oral QHS  . aspirin EC  81 mg Oral Daily  . calcium acetate  1,334 mg Oral TID WC  . docusate sodium  100 mg Oral BID  . feeding supplement (NEPRO CARB STEADY)  237 mL Oral BID BM  . heparin subcutaneous  5,000 Units Subcutaneous Q8H  . levothyroxine  75 mcg Oral QAC breakfast  . mouth rinse  15 mL Mouth Rinse BID  . metoprolol tartrate  50 mg Oral BID  . multivitamin  1 tablet Oral QHS  . OLANZapine  7.5 mg Oral QHS  . pantoprazole  40 mg Oral Daily  . sodium chloride flush  10-40 mL Intracatheter Q12H  . sodium chloride flush  10-40 mL Intracatheter Q12H  . sodium chloride flush  3 mL Intravenous Q12H   Continuous Infusions:   LOS: 16 days   Time Spent in minutes   30 minutes   Lanessa Shill D.O. on 09/08/2016 at 11:29 AM  Between 7am to 7pm - Pager - [phone]  After 7pm go to [email or web address] - password [email or web address]  And look for the night coverage person covering for me after hours  [facility] Office  [phone]

## 2016-09-08 NOTE — Progress Notes (Signed)
Notified by floor nurse that line appeared to be pulled out some, 1 stitch remained intact, noted upon assessment. Site cleaned using sterile technique, remained as found- line out as it was at 2.5cm and was redressed per stile procedure. Floor nurse Ginger put in order for chest x-ray to verify placement. Instructed floor nurse, Blanch Media to notify [facility] team when chest x-ray completed and we will flush and reconnect line. VU. Fran Lowes, RN [facility]

## 2016-09-08 NOTE — Progress Notes (Signed)
Assessment/Plan: 1 AKI/Rhabdo nonoliguric,vol ok. Cr starting to fall.Marland Kitchen Hopefully early recovery 2 anemia 3 Schizo 4 Drug OD 5 Bipolar 6 obesity P follow chem , liberalize fluid intake, if improves d/c HD cath  Subjective: Interval History: Good UOP  Objective: Vital signs in last 24 hours: Temp:  [98.3 F (36.8 C)-99 F (37.2 C)] 98.5 F (36.9 C) (04/09 0607) Pulse Rate:  [72-89] 82 (04/09 1009) Resp:  [17-18] 18 (04/09 0607) BP: (117-148)/(61-87) 143/87 (04/09 1009) SpO2:  [94 %-97 %] 94 % (04/09 0607) Weight:  [103.7 kg (228 lb 9.9 oz)] 103.7 kg (228 lb 9.9 oz) (04/09 0607) Weight change: -2.5 kg (-5 lb 8.2 oz)  Intake/Output from previous day: 04/08 0701 - 04/09 0700 In: 200 [P.O.:200] Out: 1900 [Urine:1900] Intake/Output this shift: Total I/O In: 200 [P.O.:200] Out: 975 [Urine:975]  General appearance: alert and cooperative Resp: clear to auscultation bilaterally Chest wall: no tenderness Extremities: extremities normal, atraumatic, no cyanosis or edema  Lab Results:  Recent Labs  09/06/16 0420 09/08/16 0447  WBC 11.1* 11.2*  HGB 9.7* 10.6*  HCT 29.2* 30.4*  PLT 466* 571*   BMET:  Recent Labs  09/07/16 0456 09/08/16 0447  NA 134* 135  K 3.7 3.6  CL 94* 94*  CO2 26 27  GLUCOSE 97 103*  BUN 75* 75*  CREATININE 7.53* 7.48*  CALCIUM 11.3* 12.8*   No results for input(s): PTH in the last 72 hours. Iron Studies: No results for input(s): IRON, TIBC, TRANSFERRIN, FERRITIN in the last 72 hours. Studies/Results: No results found.  Scheduled: . amLODipine  5 mg Oral QHS  . aspirin EC  81 mg Oral Daily  . calcium acetate  1,334 mg Oral TID WC  . docusate sodium  100 mg Oral BID  . feeding supplement (NEPRO CARB STEADY)  237 mL Oral BID BM  . heparin subcutaneous  5,000 Units Subcutaneous Q8H  . levothyroxine  75 mcg Oral QAC breakfast  . mouth rinse  15 mL Mouth Rinse BID  . metoprolol tartrate  50 mg Oral BID  . multivitamin  1 tablet Oral QHS   . OLANZapine  7.5 mg Oral QHS  . pantoprazole  40 mg Oral Daily  . sodium chloride flush  10-40 mL Intracatheter Q12H  . sodium chloride flush  10-40 mL Intracatheter Q12H  . sodium chloride flush  3 mL Intravenous Q12H     LOS: 16 days   Jarred Purtee C 09/08/2016,1:45 PM

## 2016-09-09 DIAGNOSIS — Z9289 Personal history of other medical treatment: Secondary | ICD-10-CM

## 2016-09-09 LAB — RENAL FUNCTION PANEL
ANION GAP: 13 (ref 5–15)
Albumin: 3.1 g/dL — ABNORMAL LOW (ref 3.5–5.0)
BUN: 74 mg/dL — ABNORMAL HIGH (ref 6–20)
CHLORIDE: 94 mmol/L — AB (ref 101–111)
CO2: 26 mmol/L (ref 22–32)
Calcium: 12.9 mg/dL — ABNORMAL HIGH (ref 8.9–10.3)
Creatinine, Ser: 6.96 mg/dL — ABNORMAL HIGH (ref 0.61–1.24)
GFR calc non Af Amer: 8 mL/min — ABNORMAL LOW (ref >60)
GFR, EST AFRICAN AMERICAN: 10 mL/min — AB (ref >60)
GLUCOSE: 98 mg/dL (ref 65–99)
Phosphorus: 6.6 mg/dL — ABNORMAL HIGH (ref 2.5–4.6)
Potassium: 3.5 mmol/L (ref 3.5–5.1)
Sodium: 133 mmol/L — ABNORMAL LOW (ref 135–145)

## 2016-09-09 LAB — CBC
HEMATOCRIT: 31.7 % — AB (ref 39.0–52.0)
HEMOGLOBIN: 10.7 g/dL — AB (ref 13.0–17.0)
MCH: 27.9 pg (ref 26.0–34.0)
MCHC: 33.8 g/dL (ref 30.0–36.0)
MCV: 82.8 fL (ref 78.0–100.0)
Platelets: 593 10*3/uL — ABNORMAL HIGH (ref 150–400)
RBC: 3.83 MIL/uL — AB (ref 4.22–5.81)
RDW: 12.3 % (ref 11.5–15.5)
WBC: 10.3 10*3/uL (ref 4.0–10.5)

## 2016-09-09 MED ORDER — NEPRO/CARBSTEADY PO LIQD: 237.0000 mL | Freq: Two times a day (BID) | ORAL | 0 refills | Status: DC

## 2016-09-09 MED ORDER — METOPROLOL TARTRATE 50 MG PO TABS: 50.0000 mg | ORAL_TABLET | Freq: Two times a day (BID) | ORAL | 0 refills | Status: DC

## 2016-09-09 MED ORDER — PANTOPRAZOLE SODIUM 40 MG PO TBEC: 40.0000 mg | DELAYED_RELEASE_TABLET | Freq: Every day | ORAL | 0 refills | Status: DC

## 2016-09-09 MED ORDER — AMLODIPINE BESYLATE 5 MG PO TABS
5.0000 mg | ORAL_TABLET | Freq: Every day | ORAL | 0 refills | Status: DC
Start: 2016-09-09 — End: 2016-10-30

## 2016-09-09 MED ORDER — RENA-VITE PO TABS: 1.0000 | ORAL_TABLET | Freq: Every day | ORAL | 0 refills | Status: DC

## 2016-09-09 MED ORDER — PROMETHAZINE HCL 25 MG PO TABS
25.0000 mg | ORAL_TABLET | Freq: Four times a day (QID) | ORAL | 0 refills | Status: DC | PRN
Start: 2016-09-09 — End: 2016-10-30

## 2016-09-09 MED ORDER — HYDROXYZINE HCL 25 MG PO TABS: 25.0000 mg | ORAL_TABLET | Freq: Three times a day (TID) | ORAL | 0 refills | Status: DC | PRN

## 2016-09-09 NOTE — Discharge Instructions (Signed)
Acute Kidney Injury, Adult Acute kidney injury is a sudden worsening of kidney function. The kidneys are organs that have several jobs. They filter the blood to remove waste products and extra fluid. They also maintain a healthy balance of minerals and hormones in the body, which helps control blood pressure and keep bones strong. With this condition, your kidneys do not do their jobs as well as they should. This condition ranges from mild to severe. Over time it may develop into long-lasting (chronic) kidney disease. Early detection and treatment may prevent acute kidney injury from developing into a chronic condition. What are the causes? Common causes of this condition include:  A problem with blood flow to the kidneys. This may be caused by:  Low blood pressure (hypotension) or shock.  Blood loss.  Heart and blood vessel (cardiovascular) disease.  Severe burns.  Liver disease.  Direct damage to the kidneys. This may be caused by:  Certain medicines.  A kidney infection.  Poisoning.  Being around or in contact with toxic substances.  A surgical wound.  A hard, direct hit to the kidney area.  A sudden blockage of urine flow. This may be caused by:  Cancer.  Kidney stones.  An enlarged prostate in males. What are the signs or symptoms? Symptoms of this condition may not be obvious until the condition becomes severe. Symptoms of this condition can include:  Tiredness (lethargy), or difficulty staying awake.  Nausea or vomiting.  Swelling (edema) of the face, legs, ankles, or feet.  Problems with urination, such as:  Abdominal pain, or pain along the side of your stomach (flank).  Decreased urine production.  Decrease in the force of urine flow.  Muscle twitches and cramps, especially in the legs.  Confusion or trouble concentrating.  Loss of appetite.  Fever. How is this diagnosed? This condition may be diagnosed with tests, including:  Blood  tests.  Urine tests.  Imaging tests.  A test in which a sample of tissue is removed from the kidneys to be examined under a microscope (kidney biopsy). How is this treated? Treatment for this condition depends on the cause and how severe the condition is. In mild cases, treatment may not be needed. The kidneys may heal on their own. In more severe cases, treatment will involve:  Treating the cause of the kidney injury. This may involve changing any medicines you are taking or adjusting your dosage.  Fluids. You may need specialized IV fluids to balance your body's needs.  Having a catheter placed to drain urine and prevent blockages.  Preventing problems from occurring. This may mean avoiding certain medicines or procedures that can cause further injury to the kidneys. In some cases treatment may also require:  A procedure to remove toxic wastes from the body (dialysis or continuous renal replacement therapy - CRRT).  Surgery. This may be done to repair a torn kidney, or to remove the blockage from the urinary system. Follow these instructions at home: Medicines   Take over-the-counter and prescription medicines only as told by your health care provider.  Do not take any new medicines without your health care provider's approval. Many medicines can worsen your kidney damage.  Do not take any vitamin and mineral supplements without your health care provider's approval. Many nutritional supplements can worsen your kidney damage. Lifestyle   If your health care provider prescribed changes to your diet, follow them. You may need to decrease the amount of protein you eat.  Achieve and maintain a   healthy weight. If you need help with this, ask your health care provider.  Start or continue an exercise plan. Try to exercise at least 30 minutes a day, 5 days a week.  Do not use any tobacco products, such as cigarettes, chewing tobacco, and e-cigarettes. If you need help quitting, ask  your health care provider. General instructions   Keep track of your blood pressure. Report changes in your blood pressure as told by your health care provider.  Stay up to date with immunizations. Ask your health care provider which immunizations you need.  Keep all follow-up visits as told by your health care provider. This is important. Where to find more information:  American Association of Kidney Patients: www.aakp.org  National Kidney Foundation: www.kidney.org  American Kidney Fund: www.akfinc.org  [employer] [facility]:  www.lifeoptions.org  www.kidneyschool.org Contact a health care provider if:  Your symptoms get worse.  You develop new symptoms. Get help right away if:  You develop symptoms of worsening kidney disease, which include:  Headaches.  Abnormally dark or light skin.  Easy bruising.  Frequent hiccups.  Chest pain.  Shortness of breath.  End of menstruation in women.  Seizures.  Confusion or altered mental status.  Abdominal or back pain.  Itchiness.  You have a fever.  Your body is producing less urine.  You have pain or bleeding when you urinate. Summary  Acute kidney injury is a sudden worsening of kidney function.  Acute kidney injury can be caused by problems with blood flow to the kidneys, direct damage to the kidneys, and sudden blockage of urine flow.  Symptoms of this condition may not be obvious until it becomes severe. Symptoms may include edema, lethargy, confusion, nausea or vomiting, and problems passing urine.  This condition can usually be diagnosed with blood tests, urine tests, and imaging tests. Sometimes a kidney biopsy is done to diagnose this condition.  Treatment for this condition often involves treating the underlying cause. It is treated with fluids, medicines, dialysis, diet changes, or surgery. This information is not intended to replace advice given to you by your health care provider.  Make sure you discuss any questions you have with your health care provider. Document Released: 12/02/2010 Document Revised: 05/09/2016 Document Reviewed: 05/09/2016 Elsevier Interactive Patient Education  2017 [employer]  

## 2016-09-09 NOTE — Discharge Summary (Signed)
Physician Discharge Summary  [name] ZOX:[id number] DOB: November 08, 1965 DOA: 08/23/2016  PCP: Isaias Cowman, PA-C  Admit date: 08/23/2016 Discharge date: 09/09/2016  Time spent: 45 minutes  Recommendations for Outpatient Follow-up:  Patient will be discharged to home with home health, physical and occupation therapy, nursing.  Patient will need to follow up with primary care provider within one week of discharge, repeat BMP weekly.  Patient should continue medications as prescribed.  Patient should follow a renal diet.   Discharge Diagnoses:  ATN with Acute Renal Failure with uremia and AMS  Rhabdomyolysis  Schizophrenia / Bipolar disorder with Drug ingestion overdose   Hypokalemia Chronic back pain / opioid dependence  Chronic constipation Chest Pain  Aspiration  Leukocytosis  GERD  Essential hypertension  Deconditioning  Discharge Condition: Stable  Diet recommendation: Renal  Filed Weights   09/06/16 0608 09/07/16 0628 09/08/16 0607  Weight: 106 kg (233 lb 11 oz) 106.2 kg (234 lb 2.1 oz) 103.7 kg (228 lb 9.9 oz)    History of present illness:  on 08/23/2016 by Dr. Greig Castilla Turneris a 51 y.o.gentleman with a history of Bipolar disorder, schizophrenia, chronic pain from his neck and back, and hypothyroidism who has a history of unintentional drug overdose and two prior episodes of rhadomyolysis causing acute kidney injury. The patient presented to the outside hospital yesterday after being found down at home by his wife with altered mental status and generalized weakness. No nausea or vomiting noted. No bowel or bladder incontinence noted. No convulsions noted. He was taken to the ED at [facility] for emergent evaluation. The patient has a spine stimulator for management of his chronic pain. His wife also reports recent changes to his pain medication (increase in dose), and she suspects that increased use of the pain medication precipitated confusion, which  contributed to him mismanaging his other medications. However, she reports refills of his medications approximately one week ago and his Zyprexa and vistaril bottles are now empty, and his gabapentin and buspar bottles are "half full". The patient's wife does not think that he was trying to hurt himself.  Hospital Course:  ATN with Acute Renal Failure with uremia and AMS  -Secondary to overdose/rhabdo -nephrology consulted and appreciated -Stated HD on 08/24/16 -S/p TLC replaced 3/29 from fem to IJ.  -Creatinine slow to improve, currently 7.48 -patient did have significant nausea as well as episodes of vomiting, no evidence of obstruction, ?uremia  -Urine output 1900cc over past 24 hours -Pending further recommendations from nephrology -Continue to monitor intake/output- Good urine output in the past 24hrs-2700cc -Follow up with weekly BMP -No need to follow up with nephrology unless PCP refers  Rhabdomyolysis  -Resolved now but significantly damaged patient's kidney function.   Schizophrenia / Bipolar disorder with Drug ingestion overdose   -Psychiatry consulted appreciated, seen on 08/28/2016. Currently does not meet criteria for inpatient psychiatric hospitalization and will receive outpatient resources for follow-up. -Catheter medications have been held due to renal function. -Resumed olanzapine 7.5 mg QHS and hydroxyzine PRN anxiety on 4/1.  -He needs to get back on his other mood stabilizers as soon as possible but because of his renal function it is likely not safe to restart it at this time. Reassess renal function and restart them as it is safe to do so. Assess mental health daily and reconsult psychiatry if needed.  -Holding off on restarting home medications on discharge given creatinine. Will need to follow up with psychiatrist or PCP on when to restart  these medications.  Hypokalemia -Resolved, continue to monitor BMP  Chronic back pain / opioid dependence  -pain is  controlled on current regimen, continue as needed  Chronic constipation -Patient has had bowel movements, will continue to monitor closely -Continued scheduled stool softeners   Chest Pain  -Resolved now. His troponin tests have been negative.  -His chest pain resolved but having more upper respiratory symptoms which is being treated.   Aspiration  -Pt had projectile vomiting episode 3/30 and now having thick yellow sputum production.  -Improving -CXR showed persistent right lung base atelectasis, no new abnormalities  -Continue aspiration precautions, incentive spirometry, nebulizer treatments  Leukocytosis  -Resolved -no current source of infection. Possibly reactive -Chest x-ray and UA obtained, unremarkable for infection  GERD  -Continue protonix (prophylaxis)  Essential hypertension  -Continue amlodipine  Deconditioning -Likely secondary to the above and prolonged hospital stay. -PT consulted and recommended CIR. CIR recommended home health services. -Will discharge with HHPT and OT, RN  Consultants Nephrology PCCM Inpatient Rehab Psychiatry   Procedures  Insertion of HD catheter - Right IJ 3/24 Right femoral temp HD catheter, US guided Intubation/Extubation  Discharge Exam: Vitals:   09/08/16 2135 09/09/16 0457  BP: (!) 147/87 138/78  Pulse: 80 83  Resp: 18 18  Temp: 97.9 F (36.6 C) 98.7 F (37.1 C)   Exam  General: Well developed, well nourished, NAD  HEENT: NCAT, mucous membranes moist.   Neck: Supple, no masses  Cardiovascular: S1 S2 auscultated, 2/6SEM, RRR  Respiratory:Diminished but clear  Abdomen: Soft, obese, nontender, nondistended, + bowel sounds  Extremities: warm dry without cyanosis clubbing or edema  Neuro: AAOx3, nonfocal  Psych:Appropriate mood and affect, pleasant  Discharge Instructions Discharge Instructions    AMB Referral to [facility]    Complete by:  As directed    Please assign UMR member  for post discharge call. Currently at [facility]. Please call with questions. Marthenia Rolling, [place], [facility] Liaison-[phone]   Reason for consult:  Please assign UMR member for post discharge call   Expected date of contact:  1-3 days (reserved for hospital discharges)   Discharge instructions    Complete by:  As directed    Patient will be discharged to home with home health, physical and occupation therapy, nursing.  Patient will need to follow up with primary care provider within one week of discharge, repeat BMP weekly.  Patient should continue medications as prescribed.  Patient should follow a renal diet.     Current Discharge Medication List    START taking these medications   Details  amLODipine (NORVASC) 5 MG tablet Take 1 tablet (5 mg total) by mouth at bedtime. Qty: 30 tablet, Refills: 0    multivitamin (RENA-VIT) TABS tablet Take 1 tablet by mouth at bedtime. Qty: 30 tablet, Refills: 0    Nutritional Supplements (FEEDING SUPPLEMENT, NEPRO CARB STEADY,) LIQD Take 237 mLs by mouth 2 (two) times daily between meals. Qty: 60 Can, Refills: 0    promethazine (PHENERGAN) 25 MG tablet Take 1 tablet (25 mg total) by mouth every 6 (six) hours as needed for nausea or vomiting. Qty: 30 tablet, Refills: 0      CONTINUE these medications which have CHANGED   Details  hydrOXYzine (ATARAX/VISTARIL) 25 MG tablet Take 1 tablet (25 mg total) by mouth every 8 (eight) hours as needed. Qty: 60 tablet, Refills: 0    metoprolol (LOPRESSOR) 50 MG tablet Take 1 tablet (50 mg total) by mouth 2 (  two) times daily. Qty: 60 tablet, Refills: 0      CONTINUE these medications which have NOT CHANGED   Details  aspirin EC 81 MG tablet Take 81 mg by mouth daily.    esomeprazole (NEXIUM) 40 MG capsule Take 40 mg by mouth daily at 12 noon.    levothyroxine (SYNTHROID, LEVOTHROID) 75 MCG tablet Take 75 mcg by mouth daily before breakfast.    nitroGLYCERIN (NITROSTAT) 0.4  MG SL tablet Place 0.4 mg under the tongue every 5 (five) hours as needed for chest pain.     OLANZapine (ZYPREXA) 7.5 MG tablet Take 7.5 mg by mouth at bedtime.    oxyCODONE (ROXICODONE) 15 MG immediate release tablet Take 15 mg by mouth 3 (three) times daily.    Vitamin D, Ergocalciferol, (DRISDOL) 50000 units CAPS capsule Take 50,000 Units by mouth every 7 (seven) days.      STOP taking these medications     busPIRone (BUSPAR) 10 MG tablet      gabapentin (NEURONTIN) 300 MG capsule      lamoTRIgine (LAMICTAL) 25 MG tablet      tamsulosin (FLOMAX) 0.4 MG CAPS capsule      tiZANidine (ZANAFLEX) 2 MG tablet      topiramate (TOPAMAX) 100 MG tablet        Allergies  Allergen Reactions  . Zolpidem Tartrate Other (See Comments)    Hallucinations and sleep walks  . Demerol [Meperidine] Itching, Rash and Hives   Follow-up Information    [facility] Follow up.   Specialty:  [place] Why:  home health services arranged, office will call and setup home visits Contact information: [address] [place] [address] [phone]        Isaias Cowman, PA-C. Schedule an appointment as soon as possible for a visit in 1 week(s).   Specialty:  Cardiology Why:  Hospital follow up Contact information: [facility]  [address] [place] [address] [phone]            The results of significant diagnostics from this hospitalization (including imaging, microbiology, ancillary and laboratory) are listed below for reference.    Significant Diagnostic Studies: Dg Chest 2 View  Result Date: 09/05/2016 CLINICAL DATA:  Leukocytosis, history GERD, former smoker EXAM: CHEST  2 VIEW COMPARISON:  08/30/2016 FINDINGS: LEFT jugular central venous catheter with tip projecting over SVC. Intraspinal stimulator present. Normal heart size, mediastinal contours, and pulmonary vascularity. Decreased RIGHT basilar atelectasis. Lungs otherwise  clear. No pleural effusion or pneumothorax. Prior cervical spine fusion. IMPRESSION: Decreased RIGHT basilar atelectasis. Electronically Signed   By: Lavonia Dana M.D.   On: 09/05/2016 09:10   US Abdomen Complete  Result Date: 08/23/2016 CLINICAL DATA:  Patient with abnormal LFTs. EXAM: ABDOMEN ULTRASOUND COMPLETE COMPARISON:  None. FINDINGS: Limited exam secondary to bowel gas and patient's inability to follow commands. Gallbladder: No gallstones or wall thickening visualized. No sonographic Murphy sign noted by sonographer. Common bile duct: Diameter: 5 mm Liver: No focal lesion identified. Within normal limits in parenchymal echogenicity. IVC: No abnormality visualized. Pancreas: Visualized portion unremarkable. Spleen: Size and appearance within normal limits. Right Kidney: Length: 11.2 cm. Echogenicity within normal limits. No mass or hydronephrosis visualized. Small amount of perinephric fluid. Left Kidney: Length: 12.7 cm. Echogenicity within normal limits. No mass or hydronephrosis visualized. Small amount of perinephric fluid. Abdominal aorta: No aneurysm visualized. Other findings: None. IMPRESSION: Markedly limited exam secondary to bowel gas and patient's inability to  cooperate. No acute process within the abdomen. No hydronephrosis. Electronically Signed   By: Lovey Newcomer M.D.   On: 08/23/2016 11:03   Dg Chest Port 1 View  Result Date: 09/08/2016 CLINICAL DATA:  Initial evaluation for central line complication. EXAM: PORTABLE CHEST 1 VIEW COMPARISON:  Prior radiograph from 09/05/2016. FINDINGS: Left IJ approach central venous catheter in place with tip overlying the confluence of the brachiocephalic vein/ SVC. Tip projects slightly horizontally. This is similar to previous. Cardiac and mediastinal silhouettes are stable, and remain within normal limits. Lungs normally inflated. Minimal residual right basilar atelectasis, decreased from prior. No new focal infiltrates. No pulmonary edema or pleural  effusion. No pneumothorax. Osseous structures unchanged. IMPRESSION: 1. Tip of left IJ approach central venous catheter overlying the confluence of the left brachiocephalic vein/SVC, stable. 2. Minimal residual right basilar atelectasis, decreased from prior. 3. No other active cardiopulmonary disease. Electronically Signed   By: Jeannine Boga M.D.   On: 09/08/2016 22:14   Dg Chest Port 1 View  Result Date: 08/30/2016 CLINICAL DATA:  Cough, leukocytosis. Pt c/o SOB. Hx CKD, former smoker. EXAM: PORTABLE CHEST 1 VIEW COMPARISON:  08/28/2016 FINDINGS: There is persistent right lung base opacity again most likely atelectasis. Lung volumes are relatively low, more noted on the right where there is elevation of the right hemidiaphragm. Remainder of the lungs is clear. Heart, mediastinum and hila are unremarkable. No convincing pleural effusion.  No pneumothorax. Right internal jugular central venous line has been removed. Left internal jugular central venous line is stable. IMPRESSION: 1. Persistent right lung base atelectasis. No new lung abnormalities. Electronically Signed   By: Lajean Manes M.D.   On: 08/30/2016 09:40   Dg Chest Port 1 View  Result Date: 08/28/2016 CLINICAL DATA:  Post left IJ dialysis catheter placement EXAM: PORTABLE CHEST 1 VIEW COMPARISON:  08/27/2016 FINDINGS: Left dialysis catheter placement. The tip is in the SVC. Right internal jugular central line tip in the upper SVC, unchanged. Spinal stimulator wires project over the mid thoracic spine. Low lung volumes with right base atelectasis. Improving aeration at the left base. No effusions. No acute bony abnormality. IMPRESSION: Right base atelectasis. Interval placement of left internal jugular dialysis catheter with the tip in the SVC. No pneumothorax. Electronically Signed   By: Rolm Baptise M.D.   On: 08/28/2016 11:44   Dg Chest Port 1 View  Result Date: 08/27/2016 CLINICAL DATA:  Respiratory failure, intubated patient.  EXAM: PORTABLE CHEST 1 VIEW COMPARISON:  Portable chest x-ray of March 11/19/2016 FINDINGS: The lungs are less well inflated today. Density at both lung bases and is increased compatible with atelectasis. There may be a small left pleural effusion. The heart is normal in size. The pulmonary vascularity is prominent centrally. The endotracheal tube tip lies 2.1 cm above the carina. The esophagogastric tube tip lies below the GE junction. The right internal jugular venous catheter tip projects over the proximal SVC. A nerve stimulator electrode projects over the mid thoracic spine. IMPRESSION: Mild hypoinflation today. Bibasilar atelectasis and probable small left pleural effusion are more conspicuous. The support tubes are in reasonable position. Electronically Signed   By: David  Martinique M.D.   On: 08/27/2016 07:49   Dg Chest Port 1 View  Result Date: 08/25/2016 CLINICAL DATA:  Endotracheal tube placement EXAM: PORTABLE CHEST 1 VIEW COMPARISON:  08/24/2016 FINDINGS: Endotracheal tube in good position. Central venous catheter tip in the SVC in good position and unchanged. No pneumothorax. Spinal cord stimulator unchanged in  the thoracic spine. Progression of bibasilar airspace disease which may be atelectasis or pneumonia. No heart failure or a fusion IMPRESSION: Endotracheal tube in good position Progression of bibasilar atelectasis/ infiltrate. Electronically Signed   By: Franchot Gallo M.D.   On: 08/25/2016 11:51   Dg Chest Port 1 View  Result Date: 08/24/2016 CLINICAL DATA:  Intubated EXAM: PORTABLE CHEST 1 VIEW COMPARISON:  08/24/2016 FINDINGS: Cardiomediastinal silhouette is stable. Endotracheal tube in place with tip 3.2 cm above the carina. Stable NG tube position. Spinal stimulator wires mid thoracic spine again noted. Stable right IJ central line position. Improvement in aeration without pulmonary edema. Mild basilar atelectasis. Mild residual atelectasis right base. No segmental infiltrate. No  pneumothorax. IMPRESSION: Endotracheal tube in place with tip 3.2 cm above the carina. Stable NG tube position. Spinal stimulator wires mid thoracic spine again noted. Stable right IJ central line position. Improvement in aeration without pulmonary edema. Mild basilar atelectasis. Mild residual atelectasis right base. No segmental infiltrate. No pneumothorax. Electronically Signed   By: Lahoma Crocker M.D.   On: 08/24/2016 21:06   Dg Chest Port 1 View  Result Date: 08/24/2016 CLINICAL DATA:  51 year old male with respiratory failure. EXAM: PORTABLE CHEST 1 VIEW COMPARISON:  Radiograph dated 08/23/2016 FINDINGS: Interval placement of an endotracheal tube the tip approximately 4.7 cm above the carina. And enteric tube courses into the left upper abdomen with tip beyond the inferior margin of the image. Right IJ central line remains in stable position. Interval development of small right pleural effusion with associated right lung base atelectasis versus infiltrate. There is no pneumothorax. The cardiac silhouette is within normal limits. No acute osseous pathology. IMPRESSION: 1. Interval placement of an endotracheal tube with tip above the carina. Enteric tube extends into the left upper abdomen. 2. Small right pleural effusion and right lung base atelectasis/infiltrate, new or progressed since the prior radiograph. No pneumothorax. Electronically Signed   By: Anner Crete M.D.   On: 08/24/2016 05:54   Dg Chest Port 1 View  Result Date: 08/23/2016 CLINICAL DATA:  Acute onset of shortness of breath. Initial encounter. EXAM: PORTABLE CHEST 1 VIEW COMPARISON:  Chest radiograph performed 08/22/2016 FINDINGS: The lungs are hypoexpanded. Vascular crowding and vascular congestion is seen. Increased interstitial markings raise concern for mild interstitial edema. No pleural effusion or pneumothorax is seen. The cardiomediastinal silhouette is normal in size. A right IJ line is noted ending about the mid SVC. Thoracic  spinal stimulation leads are partially imaged. Cervical spinal fusion hardware is noted. No acute osseous abnormalities are identified. IMPRESSION: Lungs hypoexpanded. Vascular congestion. Increased interstitial markings raise concern for mild interstitial edema. Electronically Signed   By: Garald Balding M.D.   On: 08/23/2016 02:14   Dg Abd Portable 1v  Result Date: 08/26/2016 CLINICAL DATA:  51 year old male post orogastric tube placement. Initial encounter. EXAM: PORTABLE ABDOMEN - 1 VIEW COMPARISON:  None. FINDINGS: Nasogastric tube tip gastric fundus level however the side hole is in the region of the distal esophagus. This can be advanced by 5 cm. Spine stimulating device in place. Left base atelectasis. Nonspecific bowel gas pattern. IMPRESSION: Nasogastric tube tip gastric fundus level however the side hole is in the region of the distal esophagus. This can be advanced by 5 cm. Electronically Signed   By: Genia Del M.D.   On: 08/26/2016 13:10    Microbiology: No results found for this or any previous visit (from the past 240 hour(s)).   Labs: Basic Metabolic Panel:  Recent  Labs Lab 09/05/16 0511 09/06/16 0420 09/07/16 0456 09/08/16 0447 09/09/16 0658  NA 133* 135 134* 135 133*  K 3.8 3.8 3.7 3.6 3.5  CL 94* 94* 94* 94* 94*  CO2 24 25 26 27 26   GLUCOSE 86 88 97 103* 98  BUN 65* 73* 75* 75* 74*  CREATININE 7.74* 7.71* 7.53* 7.48* 6.96*  CALCIUM 9.5 10.3 11.3* 12.8* 12.9*  PHOS 7.5* 8.8* 8.6* 8.0* 6.6*   Liver Function Tests:  Recent Labs Lab 09/05/16 0511 09/06/16 0420 09/07/16 0456 09/08/16 0447 09/09/16 0658  ALBUMIN 2.4* 2.5* 2.5* 3.0* 3.1*   No results for input(s): LIPASE, AMYLASE in the last 168 hours. No results for input(s): AMMONIA in the last 168 hours. CBC:  Recent Labs Lab 09/04/16 0500 09/05/16 0511 09/06/16 0420 09/08/16 0447 09/09/16 0658  WBC 12.9* 13.9* 11.1* 11.2* 10.3  HGB 9.7* 9.7* 9.7* 10.6* 10.7*  HCT 29.4* 29.3* 29.2* 30.4* 31.7*    MCV 83.5 83.5 83.4 83.3 82.8  PLT 364 406* 466* 571* 593*   Cardiac Enzymes: No results for input(s): CKTOTAL, CKMB, CKMBINDEX, TROPONINI in the last 168 hours. BNP: BNP (last 3 results) No results for input(s): BNP in the last 8760 hours.  ProBNP (last 3 results) No results for input(s): PROBNP in the last 8760 hours.  CBG:  Recent Labs Lab 09/04/16 1218  GLUCAP 112*       Signed:  Cristal Ford  [facility] 09/09/2016, 1:53 PM

## 2016-09-09 NOTE — Progress Notes (Signed)
Assessment/Plan: 1 AKI/Rhabdo nonoliguric,vol ok. Recovery phase 2 anemia 3 Schizo 4 Drug OD 5 Bipolar 6 obesity P Remove HD cath, OK for discharge with weekly OP f/u of chemistries by PCP, stay hydrated.  No need for renal f/u unless asked by PCP.  Subjective: Interval History: feels ok  Objective: Vital signs in last 24 hours: Temp:  [97.9 F (36.6 C)-98.7 F (37.1 C)] 98.7 F (37.1 C) (04/10 0457) Pulse Rate:  [80-83] 83 (04/10 0457) Resp:  [18] 18 (04/10 0457) BP: (127-147)/(70-87) 138/78 (04/10 0457) SpO2:  [92 %-98 %] 96 % (04/10 0457) Weight change:   Intake/Output from previous day: 04/09 0701 - 04/10 0700 In: 2536.7 [P.O.:1040; I.V.:1496.7] Out: 2700 [Urine:2700] Intake/Output this shift: Total I/O In: 0  Out: 900 [Urine:800; Emesis/NG output:100]  General appearance: alert and cooperative Back: negative Resp: clear to auscultation bilaterally Cardio: regular rate and rhythm, S1, S2 normal, no murmur, click, rub or gallop Extremities: extremities normal, atraumatic, no cyanosis or edema  Lab Results:  Recent Labs  09/08/16 0447 09/09/16 0658  WBC 11.2* 10.3  HGB 10.6* 10.7*  HCT 30.4* 31.7*  PLT 571* 593*   BMET:  Recent Labs  09/08/16 0447 09/09/16 0658  NA 135 133*  K 3.6 3.5  CL 94* 94*  CO2 27 26  GLUCOSE 103* 98  BUN 75* 74*  CREATININE 7.48* 6.96*  CALCIUM 12.8* 12.9*   No results for input(s): PTH in the last 72 hours. Iron Studies: No results for input(s): IRON, TIBC, TRANSFERRIN, FERRITIN in the last 72 hours. Studies/Results: Dg Chest Port 1 View  Result Date: 09/08/2016 CLINICAL DATA:  Initial evaluation for central line complication. EXAM: PORTABLE CHEST 1 VIEW COMPARISON:  Prior radiograph from 09/05/2016. FINDINGS: Left IJ approach central venous catheter in place with tip overlying the confluence of the brachiocephalic vein/ SVC. Tip projects slightly horizontally. This is similar to previous. Cardiac and mediastinal  silhouettes are stable, and remain within normal limits. Lungs normally inflated. Minimal residual right basilar atelectasis, decreased from prior. No new focal infiltrates. No pulmonary edema or pleural effusion. No pneumothorax. Osseous structures unchanged. IMPRESSION: 1. Tip of left IJ approach central venous catheter overlying the confluence of the left brachiocephalic vein/SVC, stable. 2. Minimal residual right basilar atelectasis, decreased from prior. 3. No other active cardiopulmonary disease. Electronically Signed   By: Jeannine Boga M.D.   On: 09/08/2016 22:14    Scheduled: . amLODipine  5 mg Oral QHS  . aspirin EC  81 mg Oral Daily  . calcium acetate  1,334 mg Oral TID WC  . docusate sodium  100 mg Oral BID  . feeding supplement (NEPRO CARB STEADY)  237 mL Oral BID BM  . heparin subcutaneous  5,000 Units Subcutaneous Q8H  . levothyroxine  75 mcg Oral QAC breakfast  . mouth rinse  15 mL Mouth Rinse BID  . metoprolol tartrate  50 mg Oral BID  . multivitamin  1 tablet Oral QHS  . OLANZapine  7.5 mg Oral QHS  . pantoprazole  40 mg Oral Daily  . sodium chloride flush  10-40 mL Intracatheter Q12H  . sodium chloride flush  10-40 mL Intracatheter Q12H  . sodium chloride flush  3 mL Intravenous Q12H    LOS: 17 days   Alyiah Ulloa C 09/09/2016,10:05 AM

## 2016-09-09 NOTE — Progress Notes (Signed)
Occupational Therapy Treatment Patient Details Name: [name] MRN: [id number] DOB: Oct 22, 1965 Today's Date: 09/09/2016    History of present illness [name] is a 51 y/o man admitted in the early morning of 08/23/16 with rhabdomyolysis after an ingestion (wife thinks it was unintentional).  Over the course of his admission his renal function continued to decline and he developed worsening azotemia with altered mental status requiring dialysis.  He then developed increasing hypoxia thought to be due to fluid overload and he was emergently intubated. Extubated 08/27/16. PMH: chronic back pain, left tibial plateau fx, bipolar do, fibromyalgia, CHI x3   OT comments  Pt has made great progress towards goals.  Pt ambulated to room toilet without AD at Mod I level, reports having 3 in 1 over commode at home for elevated surface, completing transfers at Mod I level.  Engaged in grooming tasks in standing without assist.  Pt reports wife will be present when he bathes to provide recommended supervision.  Pt agreeable to [name] upon d/c home.  Follow Up Recommendations  Home health OT    Equipment Recommendations  None recommended by OT    Recommendations for Other Services      Precautions / Restrictions Precautions Precautions: Fall Precaution Comments: has fallen since admission Restrictions Weight Bearing Restrictions: No       Mobility Bed Mobility Overal bed mobility: Modified Independent Bed Mobility: Supine to Sit;Sit to Supine     Supine to sit: Modified independent (Device/Increase time) Sit to supine: Modified independent (Device/Increase time)   General bed mobility comments: No assist needed.  Transfers Overall transfer level: Modified independent Equipment used: None Transfers: Sit to/from Stand Sit to Stand: Modified independent (Device/Increase time) Stand pivot transfers: Modified independent (Device/Increase time)       General transfer comment: Good  technique    Balance Overall balance assessment: Needs assistance   Sitting balance-Leahy Scale: Good       Standing balance-Leahy Scale: Fair Standing balance comment: No assistive device needed.                             ADL either performed or assessed with clinical judgement   ADL       Grooming: Wash/dry hands;Wash/dry face;Modified independent;Standing               Lower Body Dressing: Supervision/safety;Sit to/from stand   Toilet Transfer: Modified Independent;Ambulation           Functional mobility during ADLs: Modified independent General ADL Comments: Ambulated to toilet without AD with good safety awareness, reports 3 in1 over toilet at home for elevated surface and tub bench in shower.  Completed grooming in standing and adjusted bed sheets prior to returning to bed all at overall Mod I level.     Vision Baseline Vision/History: No visual deficits Patient Visual Report: No change from baseline            Cognition Arousal/Alertness: Awake/alert Behavior During Therapy: WFL for tasks assessed/performed Overall Cognitive Status: Within Functional Limits for tasks assessed                                                     Pertinent Vitals/ Pain       Pain Assessment: No/denies pain Pain Location: back pain, chronic  Pain Descriptors / Indicators: Aching Pain Intervention(s): Limited activity within patient's tolerance;Monitored during session         Frequency  Min 2X/week        Progress Toward Goals  OT Goals(current goals can now be found in the care plan section)  Progress towards OT goals: Progressing toward goals  Acute Rehab OT Goals Patient Stated Goal: return home OT Goal Formulation: With patient Time For Goal Achievement: 09/15/16  Plan Discharge plan remains appropriate       End of Session    OT Visit Diagnosis: Unsteadiness on feet (R26.81);Muscle weakness (generalized)  (M62.81)   Activity Tolerance Patient tolerated treatment well   Patient Left in bed;with call bell/phone within reach;with bed alarm set   Nurse Communication  (awaiting D/C)        Time: 9169-4503 OT Time Calculation (min): 10 min  Charges: OT General Charges $OT Visit: 1 Procedure OT Treatments $Self Care/Home Management : 8-22 mins   [name], [phone] 09/09/2016, 2:41 PM

## 2016-09-09 NOTE — Progress Notes (Signed)
qPhysical Therapy Treatment Patient Details Name: [name] MRN: [id number] DOB: 10-19-1965 Today's Date: 09/09/2016    History of Present Illness Mr. Iiams is a 51 y/o man admitted in the early morning of 08/23/16 with rhabdomyolysis after an ingestion (wife thinks it was unintentional).  Over the course of his admission his renal function continued to decline and he developed worsening azotemia with altered mental status requiring dialysis.  He then developed increasing hypoxia thought to be due to fluid overload and he was emergently intubated. Extubated 08/27/16. PMH: chronic back pain, left tibial plateau fx, bipolar do, fibromyalgia, CHI x3    PT Comments    Pt performed increased mobility during session progressing to gait training without the RW.  Pt with plans to d/c home with HHPT and intermittent supervision from wife checking in.  PTA educated patient on the importance of mobility and the need for continued compliance with HEP.  Pt reports mild discomfort in abdomen with nausea.      Follow Up Recommendations  Home health PT;Supervision - Intermittent     Equipment Recommendations  None recommended by PT    Recommendations for Other Services OT consult;Rehab consult     Precautions / Restrictions Precautions Precautions: Fall Precaution Comments: has fallen since admission Restrictions Weight Bearing Restrictions: No    Mobility  Bed Mobility Overal bed mobility: Modified Independent Bed Mobility: Supine to Sit;Sit to Supine       Sit to supine: Modified independent (Device/Increase time)   General bed mobility comments: No assist needed.  Transfers Overall transfer level: Modified independent Equipment used: None Transfers: Sit to/from Stand Sit to Stand: Modified independent (Device/Increase time) Stand pivot transfers: Modified independent (Device/Increase time)       General transfer comment: Good technique  Ambulation/Gait Ambulation/Gait  assistance: Supervision Ambulation Distance (Feet): 230 Feet Assistive device: None (progressed to gait without device) Gait Pattern/deviations: Step-through pattern;Trunk flexed     General Gait Details: Remains to require cues for upright posture.  NO LOB.     Stairs            Wheelchair Mobility    Modified Rankin (Stroke Patients Only)       Balance Overall balance assessment: Needs assistance   Sitting balance-Leahy Scale: Good       Standing balance-Leahy Scale: Fair Standing balance comment: No assistive device needed.                              Cognition Arousal/Alertness: Awake/alert Behavior During Therapy: WFL for tasks assessed/performed Overall Cognitive Status: Within Functional Limits for tasks assessed                                        Exercises      General Comments        Pertinent Vitals/Pain Pain Assessment: No/denies pain Pain Location: back pain, chronic Pain Descriptors / Indicators: Aching Pain Intervention(s): Monitored during session    Home Living                      Prior Function            PT Goals (current goals can now be found in the care plan section) Acute Rehab PT Goals Patient Stated Goal: return home Potential to Achieve Goals: Good Progress towards PT goals: Progressing toward  goals    Frequency    Min 3X/week      PT Plan Current plan remains appropriate    Co-evaluation             End of Session Equipment Utilized During Treatment: Gait belt Activity Tolerance: Patient tolerated treatment well Patient left: in bed;with call bell/phone within reach;with bed alarm set Nurse Communication: Mobility status PT Visit Diagnosis: Unsteadiness on feet (R26.81);Muscle weakness (generalized) (M62.81)     Time: 7207-2182 PT Time Calculation (min) (ACUTE ONLY): 10 min  Charges:  $Gait Training: 8-22 mins                    G Codes:       Governor Rooks, PTA pager [phone]    Cristela Blue 09/09/2016, 12:08 PM

## 2016-09-09 NOTE — Progress Notes (Signed)
IV team unable to draw blood via (L) IJ and Dr. Ara Kussmaul paged and informed; also asked whether to continue IV fluids.

## 2016-09-09 NOTE — Progress Notes (Signed)
Pt given discharge instructions, prescriptions, and care notes. Pt verbalized understanding AEB no further questions or concerns at this time. HD cath was discontinued by IV team, no redness, pain, or swelling noted at this time. Telemetry discontinued and Centralized Telemetry was notified. Pt left the floor via wheelchair with staff in stable condition.

## 2016-09-10 ENCOUNTER — Encounter: Payer: Self-pay | Admitting: *Deleted

## 2016-09-10 ENCOUNTER — Other Ambulatory Visit: Payer: Self-pay | Admitting: *Deleted

## 2016-09-10 DIAGNOSIS — F419 Anxiety disorder, unspecified: Secondary | ICD-10-CM | POA: Diagnosis not present

## 2016-09-10 DIAGNOSIS — M542 Cervicalgia: Secondary | ICD-10-CM | POA: Diagnosis not present

## 2016-09-10 DIAGNOSIS — F209 Schizophrenia, unspecified: Secondary | ICD-10-CM | POA: Diagnosis not present

## 2016-09-10 DIAGNOSIS — G8929 Other chronic pain: Secondary | ICD-10-CM | POA: Diagnosis not present

## 2016-09-10 DIAGNOSIS — M549 Dorsalgia, unspecified: Secondary | ICD-10-CM | POA: Diagnosis not present

## 2016-09-10 DIAGNOSIS — M6282 Rhabdomyolysis: Secondary | ICD-10-CM | POA: Diagnosis not present

## 2016-09-10 DIAGNOSIS — I10 Essential (primary) hypertension: Secondary | ICD-10-CM | POA: Diagnosis not present

## 2016-09-10 DIAGNOSIS — T43594D Poisoning by other antipsychotics and neuroleptics, undetermined, subsequent encounter: Secondary | ICD-10-CM | POA: Diagnosis not present

## 2016-09-10 DIAGNOSIS — N170 Acute kidney failure with tubular necrosis: Secondary | ICD-10-CM | POA: Diagnosis not present

## 2016-09-10 NOTE — Patient Outreach (Addendum)
[name]) Care Management  09/10/2016  [name] March 20, 1966 [id number]   Subjective: Telephone call to patient's home number, spoke with patient, and HIPAA verified.   Discussed [facility] Care Management UMR Transition of care follow up, patient voices understanding, and is in agreement to complete follow up. Patient states he is doing much better, is receiving home health services through [facility], also has personal care services (PCS)  through Medicaid via [employer].  States he is in the process of resuming Physiological scientist and will call the agency.  States he is in the process of scheduling his follow up appointments with providers based on his wife's schedule.   States he has already contacted the providers office to tentatively schedule appointment and will call back to confirm once he receives confirmation from wife of her availability to take him to his appointments.  States he is currently separated from wife but wife assists him with transportation and healthcare as needed.  Patient states he would like some educational materials on diet and is in agreement to receive the following EMMI handout Renal (Kidney Disease) Diet (For People Not on Dialysis).   Patient states she does not have any transition of care, care coordination, disease management, disease monitoring, transportation, community resource, or pharmacy needs at this time.  States he is very appreciative of the follow up call and is in agreement to receive [place] Management information.    Objective: Per chart review, patient hospitalized 08/23/16 -09/09/16 for   ATN with Acute Renal Failure with uremia and altered mental status.   Patient also has a history of Rhabdomyolysis, schizophrenia / Bipolar disorder with Drug ingestion overdose, Chronic back pain / opioid dependence, and hypertension.   Assessment:  Received UMR Transition of care referral on 09/03/16.   Transition of care follow up completed, no care  management needs, and will proceed with case closure.    Plan:   RNCM will send patient successful outreach letter, [facility] pamphlet, magnet, and EMMI handout (Renal (Kidney Disease) Diet (For People Not on Dialysis . RNCM will send case closure due to follow up completed / no care management needs request to [facility] [place] [facility].   Rayneisha Bouza H. Annia Friendly, BSN, [place] Management [facility] Telephonic CM Phone: [phone] Fax: [phone]

## 2016-09-11 DIAGNOSIS — M6282 Rhabdomyolysis: Secondary | ICD-10-CM | POA: Diagnosis not present

## 2016-09-11 DIAGNOSIS — M549 Dorsalgia, unspecified: Secondary | ICD-10-CM | POA: Diagnosis not present

## 2016-09-11 DIAGNOSIS — T43594D Poisoning by other antipsychotics and neuroleptics, undetermined, subsequent encounter: Secondary | ICD-10-CM | POA: Diagnosis not present

## 2016-09-11 DIAGNOSIS — M542 Cervicalgia: Secondary | ICD-10-CM | POA: Diagnosis not present

## 2016-09-11 DIAGNOSIS — F419 Anxiety disorder, unspecified: Secondary | ICD-10-CM | POA: Diagnosis not present

## 2016-09-11 DIAGNOSIS — F209 Schizophrenia, unspecified: Secondary | ICD-10-CM | POA: Diagnosis not present

## 2016-09-11 DIAGNOSIS — N170 Acute kidney failure with tubular necrosis: Secondary | ICD-10-CM | POA: Diagnosis not present

## 2016-09-11 DIAGNOSIS — G8929 Other chronic pain: Secondary | ICD-10-CM | POA: Diagnosis not present

## 2016-09-11 DIAGNOSIS — I10 Essential (primary) hypertension: Secondary | ICD-10-CM | POA: Diagnosis not present

## 2016-09-12 DIAGNOSIS — F319 Bipolar disorder, unspecified: Secondary | ICD-10-CM | POA: Diagnosis not present

## 2016-09-15 DIAGNOSIS — M47816 Spondylosis without myelopathy or radiculopathy, lumbar region: Secondary | ICD-10-CM | POA: Diagnosis not present

## 2016-09-15 DIAGNOSIS — F419 Anxiety disorder, unspecified: Secondary | ICD-10-CM | POA: Diagnosis not present

## 2016-09-15 DIAGNOSIS — M5136 Other intervertebral disc degeneration, lumbar region: Secondary | ICD-10-CM | POA: Diagnosis not present

## 2016-09-15 DIAGNOSIS — F319 Bipolar disorder, unspecified: Secondary | ICD-10-CM | POA: Diagnosis not present

## 2016-09-15 DIAGNOSIS — M179 Osteoarthritis of knee, unspecified: Secondary | ICD-10-CM | POA: Diagnosis not present

## 2016-09-15 DIAGNOSIS — M25551 Pain in right hip: Secondary | ICD-10-CM | POA: Diagnosis not present

## 2016-09-15 DIAGNOSIS — Z720 Tobacco use: Secondary | ICD-10-CM | POA: Diagnosis not present

## 2016-09-15 DIAGNOSIS — G894 Chronic pain syndrome: Secondary | ICD-10-CM | POA: Diagnosis not present

## 2016-09-15 DIAGNOSIS — F1120 Opioid dependence, uncomplicated: Secondary | ICD-10-CM | POA: Diagnosis not present

## 2016-09-15 DIAGNOSIS — Z79891 Long term (current) use of opiate analgesic: Secondary | ICD-10-CM | POA: Diagnosis not present

## 2016-09-16 DIAGNOSIS — T50901A Poisoning by unspecified drugs, medicaments and biological substances, accidental (unintentional), initial encounter: Secondary | ICD-10-CM | POA: Diagnosis not present

## 2016-09-16 DIAGNOSIS — Z79899 Other long term (current) drug therapy: Secondary | ICD-10-CM | POA: Diagnosis not present

## 2016-09-16 DIAGNOSIS — G8929 Other chronic pain: Secondary | ICD-10-CM | POA: Diagnosis not present

## 2016-09-16 DIAGNOSIS — M549 Dorsalgia, unspecified: Secondary | ICD-10-CM | POA: Diagnosis not present

## 2016-09-16 DIAGNOSIS — E782 Mixed hyperlipidemia: Secondary | ICD-10-CM | POA: Diagnosis not present

## 2016-09-16 DIAGNOSIS — T43594D Poisoning by other antipsychotics and neuroleptics, undetermined, subsequent encounter: Secondary | ICD-10-CM | POA: Diagnosis not present

## 2016-09-16 DIAGNOSIS — M6282 Rhabdomyolysis: Secondary | ICD-10-CM | POA: Diagnosis not present

## 2016-09-16 DIAGNOSIS — F419 Anxiety disorder, unspecified: Secondary | ICD-10-CM | POA: Diagnosis not present

## 2016-09-16 DIAGNOSIS — M542 Cervicalgia: Secondary | ICD-10-CM | POA: Diagnosis not present

## 2016-09-16 DIAGNOSIS — F209 Schizophrenia, unspecified: Secondary | ICD-10-CM | POA: Diagnosis not present

## 2016-09-16 DIAGNOSIS — I10 Essential (primary) hypertension: Secondary | ICD-10-CM | POA: Diagnosis not present

## 2016-09-16 DIAGNOSIS — Z09 Encounter for follow-up examination after completed treatment for conditions other than malignant neoplasm: Secondary | ICD-10-CM | POA: Diagnosis not present

## 2016-09-16 DIAGNOSIS — N178 Other acute kidney failure: Secondary | ICD-10-CM | POA: Diagnosis not present

## 2016-09-16 DIAGNOSIS — N170 Acute kidney failure with tubular necrosis: Secondary | ICD-10-CM | POA: Diagnosis not present

## 2016-09-17 DIAGNOSIS — M549 Dorsalgia, unspecified: Secondary | ICD-10-CM | POA: Diagnosis not present

## 2016-09-17 DIAGNOSIS — F319 Bipolar disorder, unspecified: Secondary | ICD-10-CM | POA: Diagnosis not present

## 2016-09-17 DIAGNOSIS — G8929 Other chronic pain: Secondary | ICD-10-CM | POA: Diagnosis not present

## 2016-09-17 DIAGNOSIS — M6282 Rhabdomyolysis: Secondary | ICD-10-CM | POA: Diagnosis not present

## 2016-09-17 DIAGNOSIS — N170 Acute kidney failure with tubular necrosis: Secondary | ICD-10-CM | POA: Diagnosis not present

## 2016-09-17 DIAGNOSIS — M542 Cervicalgia: Secondary | ICD-10-CM | POA: Diagnosis not present

## 2016-09-17 DIAGNOSIS — I10 Essential (primary) hypertension: Secondary | ICD-10-CM | POA: Diagnosis not present

## 2016-09-17 DIAGNOSIS — F209 Schizophrenia, unspecified: Secondary | ICD-10-CM | POA: Diagnosis not present

## 2016-09-17 DIAGNOSIS — F419 Anxiety disorder, unspecified: Secondary | ICD-10-CM | POA: Diagnosis not present

## 2016-09-17 DIAGNOSIS — T43594D Poisoning by other antipsychotics and neuroleptics, undetermined, subsequent encounter: Secondary | ICD-10-CM | POA: Diagnosis not present

## 2016-09-18 DIAGNOSIS — M542 Cervicalgia: Secondary | ICD-10-CM | POA: Diagnosis not present

## 2016-09-18 DIAGNOSIS — M6282 Rhabdomyolysis: Secondary | ICD-10-CM | POA: Diagnosis not present

## 2016-09-18 DIAGNOSIS — F419 Anxiety disorder, unspecified: Secondary | ICD-10-CM | POA: Diagnosis not present

## 2016-09-18 DIAGNOSIS — T43594D Poisoning by other antipsychotics and neuroleptics, undetermined, subsequent encounter: Secondary | ICD-10-CM | POA: Diagnosis not present

## 2016-09-18 DIAGNOSIS — I10 Essential (primary) hypertension: Secondary | ICD-10-CM | POA: Diagnosis not present

## 2016-09-18 DIAGNOSIS — G8929 Other chronic pain: Secondary | ICD-10-CM | POA: Diagnosis not present

## 2016-09-18 DIAGNOSIS — F209 Schizophrenia, unspecified: Secondary | ICD-10-CM | POA: Diagnosis not present

## 2016-09-18 DIAGNOSIS — M549 Dorsalgia, unspecified: Secondary | ICD-10-CM | POA: Diagnosis not present

## 2016-09-18 DIAGNOSIS — N170 Acute kidney failure with tubular necrosis: Secondary | ICD-10-CM | POA: Diagnosis not present

## 2016-09-19 DIAGNOSIS — F319 Bipolar disorder, unspecified: Secondary | ICD-10-CM | POA: Diagnosis not present

## 2016-09-20 DIAGNOSIS — I10 Essential (primary) hypertension: Secondary | ICD-10-CM | POA: Diagnosis not present

## 2016-09-20 DIAGNOSIS — G8929 Other chronic pain: Secondary | ICD-10-CM | POA: Diagnosis not present

## 2016-09-20 DIAGNOSIS — M6282 Rhabdomyolysis: Secondary | ICD-10-CM | POA: Diagnosis not present

## 2016-09-20 DIAGNOSIS — M542 Cervicalgia: Secondary | ICD-10-CM | POA: Diagnosis not present

## 2016-09-20 DIAGNOSIS — F419 Anxiety disorder, unspecified: Secondary | ICD-10-CM | POA: Diagnosis not present

## 2016-09-20 DIAGNOSIS — F209 Schizophrenia, unspecified: Secondary | ICD-10-CM | POA: Diagnosis not present

## 2016-09-20 DIAGNOSIS — T43594D Poisoning by other antipsychotics and neuroleptics, undetermined, subsequent encounter: Secondary | ICD-10-CM | POA: Diagnosis not present

## 2016-09-20 DIAGNOSIS — M549 Dorsalgia, unspecified: Secondary | ICD-10-CM | POA: Diagnosis not present

## 2016-09-20 DIAGNOSIS — N170 Acute kidney failure with tubular necrosis: Secondary | ICD-10-CM | POA: Diagnosis not present

## 2016-09-22 DIAGNOSIS — F319 Bipolar disorder, unspecified: Secondary | ICD-10-CM | POA: Diagnosis not present

## 2016-09-24 DIAGNOSIS — I10 Essential (primary) hypertension: Secondary | ICD-10-CM | POA: Diagnosis not present

## 2016-09-24 DIAGNOSIS — M6282 Rhabdomyolysis: Secondary | ICD-10-CM | POA: Diagnosis not present

## 2016-09-24 DIAGNOSIS — M542 Cervicalgia: Secondary | ICD-10-CM | POA: Diagnosis not present

## 2016-09-24 DIAGNOSIS — F419 Anxiety disorder, unspecified: Secondary | ICD-10-CM | POA: Diagnosis not present

## 2016-09-24 DIAGNOSIS — G8929 Other chronic pain: Secondary | ICD-10-CM | POA: Diagnosis not present

## 2016-09-24 DIAGNOSIS — F319 Bipolar disorder, unspecified: Secondary | ICD-10-CM | POA: Diagnosis not present

## 2016-09-24 DIAGNOSIS — N170 Acute kidney failure with tubular necrosis: Secondary | ICD-10-CM | POA: Diagnosis not present

## 2016-09-24 DIAGNOSIS — F209 Schizophrenia, unspecified: Secondary | ICD-10-CM | POA: Diagnosis not present

## 2016-09-24 DIAGNOSIS — T43594D Poisoning by other antipsychotics and neuroleptics, undetermined, subsequent encounter: Secondary | ICD-10-CM | POA: Diagnosis not present

## 2016-09-24 DIAGNOSIS — M549 Dorsalgia, unspecified: Secondary | ICD-10-CM | POA: Diagnosis not present

## 2016-09-25 DIAGNOSIS — G8929 Other chronic pain: Secondary | ICD-10-CM | POA: Diagnosis not present

## 2016-09-25 DIAGNOSIS — F419 Anxiety disorder, unspecified: Secondary | ICD-10-CM | POA: Diagnosis not present

## 2016-09-25 DIAGNOSIS — M542 Cervicalgia: Secondary | ICD-10-CM | POA: Diagnosis not present

## 2016-09-25 DIAGNOSIS — M6282 Rhabdomyolysis: Secondary | ICD-10-CM | POA: Diagnosis not present

## 2016-09-25 DIAGNOSIS — F209 Schizophrenia, unspecified: Secondary | ICD-10-CM | POA: Diagnosis not present

## 2016-09-25 DIAGNOSIS — I10 Essential (primary) hypertension: Secondary | ICD-10-CM | POA: Diagnosis not present

## 2016-09-25 DIAGNOSIS — N170 Acute kidney failure with tubular necrosis: Secondary | ICD-10-CM | POA: Diagnosis not present

## 2016-09-25 DIAGNOSIS — T43594D Poisoning by other antipsychotics and neuroleptics, undetermined, subsequent encounter: Secondary | ICD-10-CM | POA: Diagnosis not present

## 2016-09-25 DIAGNOSIS — M549 Dorsalgia, unspecified: Secondary | ICD-10-CM | POA: Diagnosis not present

## 2016-09-26 DIAGNOSIS — N170 Acute kidney failure with tubular necrosis: Secondary | ICD-10-CM | POA: Diagnosis not present

## 2016-09-26 DIAGNOSIS — G8929 Other chronic pain: Secondary | ICD-10-CM | POA: Diagnosis not present

## 2016-09-26 DIAGNOSIS — F319 Bipolar disorder, unspecified: Secondary | ICD-10-CM | POA: Diagnosis not present

## 2016-09-26 DIAGNOSIS — F209 Schizophrenia, unspecified: Secondary | ICD-10-CM | POA: Diagnosis not present

## 2016-09-26 DIAGNOSIS — M549 Dorsalgia, unspecified: Secondary | ICD-10-CM | POA: Diagnosis not present

## 2016-09-26 DIAGNOSIS — M542 Cervicalgia: Secondary | ICD-10-CM | POA: Diagnosis not present

## 2016-09-26 DIAGNOSIS — M6282 Rhabdomyolysis: Secondary | ICD-10-CM | POA: Diagnosis not present

## 2016-09-26 DIAGNOSIS — T43594D Poisoning by other antipsychotics and neuroleptics, undetermined, subsequent encounter: Secondary | ICD-10-CM | POA: Diagnosis not present

## 2016-09-26 DIAGNOSIS — I10 Essential (primary) hypertension: Secondary | ICD-10-CM | POA: Diagnosis not present

## 2016-09-26 DIAGNOSIS — F419 Anxiety disorder, unspecified: Secondary | ICD-10-CM | POA: Diagnosis not present

## 2016-09-29 DIAGNOSIS — F319 Bipolar disorder, unspecified: Secondary | ICD-10-CM | POA: Diagnosis not present

## 2016-09-30 DIAGNOSIS — M542 Cervicalgia: Secondary | ICD-10-CM | POA: Diagnosis not present

## 2016-09-30 DIAGNOSIS — T43594D Poisoning by other antipsychotics and neuroleptics, undetermined, subsequent encounter: Secondary | ICD-10-CM | POA: Diagnosis not present

## 2016-09-30 DIAGNOSIS — I10 Essential (primary) hypertension: Secondary | ICD-10-CM | POA: Diagnosis not present

## 2016-09-30 DIAGNOSIS — M6282 Rhabdomyolysis: Secondary | ICD-10-CM | POA: Diagnosis not present

## 2016-09-30 DIAGNOSIS — N170 Acute kidney failure with tubular necrosis: Secondary | ICD-10-CM | POA: Diagnosis not present

## 2016-09-30 DIAGNOSIS — M549 Dorsalgia, unspecified: Secondary | ICD-10-CM | POA: Diagnosis not present

## 2016-09-30 DIAGNOSIS — G8929 Other chronic pain: Secondary | ICD-10-CM | POA: Diagnosis not present

## 2016-09-30 DIAGNOSIS — F209 Schizophrenia, unspecified: Secondary | ICD-10-CM | POA: Diagnosis not present

## 2016-09-30 DIAGNOSIS — F419 Anxiety disorder, unspecified: Secondary | ICD-10-CM | POA: Diagnosis not present

## 2016-10-01 DIAGNOSIS — T43594D Poisoning by other antipsychotics and neuroleptics, undetermined, subsequent encounter: Secondary | ICD-10-CM | POA: Diagnosis not present

## 2016-10-01 DIAGNOSIS — F419 Anxiety disorder, unspecified: Secondary | ICD-10-CM | POA: Diagnosis not present

## 2016-10-01 DIAGNOSIS — M549 Dorsalgia, unspecified: Secondary | ICD-10-CM | POA: Diagnosis not present

## 2016-10-01 DIAGNOSIS — M6282 Rhabdomyolysis: Secondary | ICD-10-CM | POA: Diagnosis not present

## 2016-10-01 DIAGNOSIS — N170 Acute kidney failure with tubular necrosis: Secondary | ICD-10-CM | POA: Diagnosis not present

## 2016-10-01 DIAGNOSIS — F209 Schizophrenia, unspecified: Secondary | ICD-10-CM | POA: Diagnosis not present

## 2016-10-01 DIAGNOSIS — F319 Bipolar disorder, unspecified: Secondary | ICD-10-CM | POA: Diagnosis not present

## 2016-10-01 DIAGNOSIS — G8929 Other chronic pain: Secondary | ICD-10-CM | POA: Diagnosis not present

## 2016-10-01 DIAGNOSIS — M542 Cervicalgia: Secondary | ICD-10-CM | POA: Diagnosis not present

## 2016-10-01 DIAGNOSIS — I10 Essential (primary) hypertension: Secondary | ICD-10-CM | POA: Diagnosis not present

## 2016-10-02 DIAGNOSIS — F411 Generalized anxiety disorder: Secondary | ICD-10-CM | POA: Diagnosis not present

## 2016-10-02 DIAGNOSIS — F3132 Bipolar disorder, current episode depressed, moderate: Secondary | ICD-10-CM | POA: Diagnosis not present

## 2016-10-03 DIAGNOSIS — I10 Essential (primary) hypertension: Secondary | ICD-10-CM | POA: Diagnosis not present

## 2016-10-03 DIAGNOSIS — T43594D Poisoning by other antipsychotics and neuroleptics, undetermined, subsequent encounter: Secondary | ICD-10-CM | POA: Diagnosis not present

## 2016-10-03 DIAGNOSIS — M549 Dorsalgia, unspecified: Secondary | ICD-10-CM | POA: Diagnosis not present

## 2016-10-03 DIAGNOSIS — F319 Bipolar disorder, unspecified: Secondary | ICD-10-CM | POA: Diagnosis not present

## 2016-10-03 DIAGNOSIS — M6282 Rhabdomyolysis: Secondary | ICD-10-CM | POA: Diagnosis not present

## 2016-10-03 DIAGNOSIS — N170 Acute kidney failure with tubular necrosis: Secondary | ICD-10-CM | POA: Diagnosis not present

## 2016-10-03 DIAGNOSIS — M542 Cervicalgia: Secondary | ICD-10-CM | POA: Diagnosis not present

## 2016-10-03 DIAGNOSIS — F419 Anxiety disorder, unspecified: Secondary | ICD-10-CM | POA: Diagnosis not present

## 2016-10-03 DIAGNOSIS — G8929 Other chronic pain: Secondary | ICD-10-CM | POA: Diagnosis not present

## 2016-10-03 DIAGNOSIS — F209 Schizophrenia, unspecified: Secondary | ICD-10-CM | POA: Diagnosis not present

## 2016-10-06 DIAGNOSIS — F319 Bipolar disorder, unspecified: Secondary | ICD-10-CM | POA: Diagnosis not present

## 2016-10-07 DIAGNOSIS — I10 Essential (primary) hypertension: Secondary | ICD-10-CM | POA: Diagnosis not present

## 2016-10-07 DIAGNOSIS — G8929 Other chronic pain: Secondary | ICD-10-CM | POA: Diagnosis not present

## 2016-10-07 DIAGNOSIS — N170 Acute kidney failure with tubular necrosis: Secondary | ICD-10-CM | POA: Diagnosis not present

## 2016-10-07 DIAGNOSIS — M6282 Rhabdomyolysis: Secondary | ICD-10-CM | POA: Diagnosis not present

## 2016-10-07 DIAGNOSIS — F209 Schizophrenia, unspecified: Secondary | ICD-10-CM | POA: Diagnosis not present

## 2016-10-07 DIAGNOSIS — M549 Dorsalgia, unspecified: Secondary | ICD-10-CM | POA: Diagnosis not present

## 2016-10-07 DIAGNOSIS — T43594D Poisoning by other antipsychotics and neuroleptics, undetermined, subsequent encounter: Secondary | ICD-10-CM | POA: Diagnosis not present

## 2016-10-07 DIAGNOSIS — M542 Cervicalgia: Secondary | ICD-10-CM | POA: Diagnosis not present

## 2016-10-07 DIAGNOSIS — F419 Anxiety disorder, unspecified: Secondary | ICD-10-CM | POA: Diagnosis not present

## 2016-10-08 DIAGNOSIS — F319 Bipolar disorder, unspecified: Secondary | ICD-10-CM | POA: Diagnosis not present

## 2016-10-09 DIAGNOSIS — F209 Schizophrenia, unspecified: Secondary | ICD-10-CM | POA: Diagnosis not present

## 2016-10-09 DIAGNOSIS — I10 Essential (primary) hypertension: Secondary | ICD-10-CM | POA: Diagnosis not present

## 2016-10-09 DIAGNOSIS — M6282 Rhabdomyolysis: Secondary | ICD-10-CM | POA: Diagnosis not present

## 2016-10-09 DIAGNOSIS — N170 Acute kidney failure with tubular necrosis: Secondary | ICD-10-CM | POA: Diagnosis not present

## 2016-10-09 DIAGNOSIS — F419 Anxiety disorder, unspecified: Secondary | ICD-10-CM | POA: Diagnosis not present

## 2016-10-09 DIAGNOSIS — M549 Dorsalgia, unspecified: Secondary | ICD-10-CM | POA: Diagnosis not present

## 2016-10-09 DIAGNOSIS — M542 Cervicalgia: Secondary | ICD-10-CM | POA: Diagnosis not present

## 2016-10-09 DIAGNOSIS — T43594D Poisoning by other antipsychotics and neuroleptics, undetermined, subsequent encounter: Secondary | ICD-10-CM | POA: Diagnosis not present

## 2016-10-09 DIAGNOSIS — G8929 Other chronic pain: Secondary | ICD-10-CM | POA: Diagnosis not present

## 2016-10-10 DIAGNOSIS — F419 Anxiety disorder, unspecified: Secondary | ICD-10-CM | POA: Diagnosis not present

## 2016-10-10 DIAGNOSIS — G8929 Other chronic pain: Secondary | ICD-10-CM | POA: Diagnosis not present

## 2016-10-10 DIAGNOSIS — N170 Acute kidney failure with tubular necrosis: Secondary | ICD-10-CM | POA: Diagnosis not present

## 2016-10-10 DIAGNOSIS — F209 Schizophrenia, unspecified: Secondary | ICD-10-CM | POA: Diagnosis not present

## 2016-10-10 DIAGNOSIS — M542 Cervicalgia: Secondary | ICD-10-CM | POA: Diagnosis not present

## 2016-10-10 DIAGNOSIS — I10 Essential (primary) hypertension: Secondary | ICD-10-CM | POA: Diagnosis not present

## 2016-10-10 DIAGNOSIS — F319 Bipolar disorder, unspecified: Secondary | ICD-10-CM | POA: Diagnosis not present

## 2016-10-10 DIAGNOSIS — T43594D Poisoning by other antipsychotics and neuroleptics, undetermined, subsequent encounter: Secondary | ICD-10-CM | POA: Diagnosis not present

## 2016-10-10 DIAGNOSIS — M6282 Rhabdomyolysis: Secondary | ICD-10-CM | POA: Diagnosis not present

## 2016-10-10 DIAGNOSIS — M549 Dorsalgia, unspecified: Secondary | ICD-10-CM | POA: Diagnosis not present

## 2016-10-12 DIAGNOSIS — M545 Low back pain: Secondary | ICD-10-CM | POA: Diagnosis not present

## 2016-10-12 DIAGNOSIS — R109 Unspecified abdominal pain: Secondary | ICD-10-CM | POA: Diagnosis not present

## 2016-10-12 DIAGNOSIS — R079 Chest pain, unspecified: Secondary | ICD-10-CM | POA: Diagnosis not present

## 2016-10-12 DIAGNOSIS — R339 Retention of urine, unspecified: Secondary | ICD-10-CM | POA: Diagnosis not present

## 2016-10-13 DIAGNOSIS — F319 Bipolar disorder, unspecified: Secondary | ICD-10-CM | POA: Diagnosis not present

## 2016-10-15 DIAGNOSIS — M549 Dorsalgia, unspecified: Secondary | ICD-10-CM | POA: Diagnosis not present

## 2016-10-15 DIAGNOSIS — T43594D Poisoning by other antipsychotics and neuroleptics, undetermined, subsequent encounter: Secondary | ICD-10-CM | POA: Diagnosis not present

## 2016-10-15 DIAGNOSIS — N170 Acute kidney failure with tubular necrosis: Secondary | ICD-10-CM | POA: Diagnosis not present

## 2016-10-15 DIAGNOSIS — G8929 Other chronic pain: Secondary | ICD-10-CM | POA: Diagnosis not present

## 2016-10-15 DIAGNOSIS — M6282 Rhabdomyolysis: Secondary | ICD-10-CM | POA: Diagnosis not present

## 2016-10-15 DIAGNOSIS — M542 Cervicalgia: Secondary | ICD-10-CM | POA: Diagnosis not present

## 2016-10-15 DIAGNOSIS — F319 Bipolar disorder, unspecified: Secondary | ICD-10-CM | POA: Diagnosis not present

## 2016-10-15 DIAGNOSIS — I10 Essential (primary) hypertension: Secondary | ICD-10-CM | POA: Diagnosis not present

## 2016-10-15 DIAGNOSIS — F419 Anxiety disorder, unspecified: Secondary | ICD-10-CM | POA: Diagnosis not present

## 2016-10-15 DIAGNOSIS — F209 Schizophrenia, unspecified: Secondary | ICD-10-CM | POA: Diagnosis not present

## 2016-10-16 DIAGNOSIS — F1120 Opioid dependence, uncomplicated: Secondary | ICD-10-CM | POA: Diagnosis not present

## 2016-10-16 DIAGNOSIS — Z720 Tobacco use: Secondary | ICD-10-CM | POA: Diagnosis not present

## 2016-10-16 DIAGNOSIS — M5136 Other intervertebral disc degeneration, lumbar region: Secondary | ICD-10-CM | POA: Diagnosis not present

## 2016-10-16 DIAGNOSIS — Z79891 Long term (current) use of opiate analgesic: Secondary | ICD-10-CM | POA: Diagnosis not present

## 2016-10-16 DIAGNOSIS — M179 Osteoarthritis of knee, unspecified: Secondary | ICD-10-CM | POA: Diagnosis not present

## 2016-10-16 DIAGNOSIS — G894 Chronic pain syndrome: Secondary | ICD-10-CM | POA: Diagnosis not present

## 2016-10-16 DIAGNOSIS — M25551 Pain in right hip: Secondary | ICD-10-CM | POA: Diagnosis not present

## 2016-10-16 DIAGNOSIS — F419 Anxiety disorder, unspecified: Secondary | ICD-10-CM | POA: Diagnosis not present

## 2016-10-16 DIAGNOSIS — M47816 Spondylosis without myelopathy or radiculopathy, lumbar region: Secondary | ICD-10-CM | POA: Diagnosis not present

## 2016-10-17 DIAGNOSIS — T43594D Poisoning by other antipsychotics and neuroleptics, undetermined, subsequent encounter: Secondary | ICD-10-CM | POA: Diagnosis not present

## 2016-10-17 DIAGNOSIS — M6282 Rhabdomyolysis: Secondary | ICD-10-CM | POA: Diagnosis not present

## 2016-10-17 DIAGNOSIS — F209 Schizophrenia, unspecified: Secondary | ICD-10-CM | POA: Diagnosis not present

## 2016-10-17 DIAGNOSIS — N170 Acute kidney failure with tubular necrosis: Secondary | ICD-10-CM | POA: Diagnosis not present

## 2016-10-17 DIAGNOSIS — M542 Cervicalgia: Secondary | ICD-10-CM | POA: Diagnosis not present

## 2016-10-17 DIAGNOSIS — M549 Dorsalgia, unspecified: Secondary | ICD-10-CM | POA: Diagnosis not present

## 2016-10-17 DIAGNOSIS — I10 Essential (primary) hypertension: Secondary | ICD-10-CM | POA: Diagnosis not present

## 2016-10-17 DIAGNOSIS — F319 Bipolar disorder, unspecified: Secondary | ICD-10-CM | POA: Diagnosis not present

## 2016-10-17 DIAGNOSIS — F419 Anxiety disorder, unspecified: Secondary | ICD-10-CM | POA: Diagnosis not present

## 2016-10-17 DIAGNOSIS — G8929 Other chronic pain: Secondary | ICD-10-CM | POA: Diagnosis not present

## 2016-10-20 DIAGNOSIS — F319 Bipolar disorder, unspecified: Secondary | ICD-10-CM | POA: Diagnosis not present

## 2016-10-22 DIAGNOSIS — F319 Bipolar disorder, unspecified: Secondary | ICD-10-CM | POA: Diagnosis not present

## 2016-10-24 DIAGNOSIS — F319 Bipolar disorder, unspecified: Secondary | ICD-10-CM | POA: Diagnosis not present

## 2016-10-25 ENCOUNTER — Encounter (HOSPITAL_COMMUNITY): Payer: Self-pay | Admitting: Emergency Medicine

## 2016-10-25 DIAGNOSIS — Z7982 Long term (current) use of aspirin: Secondary | ICD-10-CM | POA: Insufficient documentation

## 2016-10-25 DIAGNOSIS — N189 Chronic kidney disease, unspecified: Secondary | ICD-10-CM | POA: Diagnosis not present

## 2016-10-25 DIAGNOSIS — F3164 Bipolar disorder, current episode mixed, severe, with psychotic features: Secondary | ICD-10-CM | POA: Diagnosis not present

## 2016-10-25 DIAGNOSIS — R440 Auditory hallucinations: Secondary | ICD-10-CM | POA: Insufficient documentation

## 2016-10-25 DIAGNOSIS — R443 Hallucinations, unspecified: Secondary | ICD-10-CM | POA: Diagnosis not present

## 2016-10-25 DIAGNOSIS — M6282 Rhabdomyolysis: Secondary | ICD-10-CM | POA: Diagnosis not present

## 2016-10-25 DIAGNOSIS — Z87891 Personal history of nicotine dependence: Secondary | ICD-10-CM | POA: Insufficient documentation

## 2016-10-25 DIAGNOSIS — I129 Hypertensive chronic kidney disease with stage 1 through stage 4 chronic kidney disease, or unspecified chronic kidney disease: Secondary | ICD-10-CM | POA: Diagnosis not present

## 2016-10-25 DIAGNOSIS — Z79899 Other long term (current) drug therapy: Secondary | ICD-10-CM | POA: Insufficient documentation

## 2016-10-25 DIAGNOSIS — F319 Bipolar disorder, unspecified: Secondary | ICD-10-CM | POA: Diagnosis not present

## 2016-10-25 DIAGNOSIS — F1190 Opioid use, unspecified, uncomplicated: Secondary | ICD-10-CM | POA: Diagnosis not present

## 2016-10-25 DIAGNOSIS — R45851 Suicidal ideations: Secondary | ICD-10-CM | POA: Diagnosis not present

## 2016-10-25 DIAGNOSIS — F39 Unspecified mood [affective] disorder: Secondary | ICD-10-CM | POA: Diagnosis not present

## 2016-10-25 DIAGNOSIS — F419 Anxiety disorder, unspecified: Secondary | ICD-10-CM | POA: Diagnosis present

## 2016-10-25 DIAGNOSIS — F1490 Cocaine use, unspecified, uncomplicated: Secondary | ICD-10-CM | POA: Diagnosis not present

## 2016-10-25 NOTE — ED Triage Notes (Addendum)
Pt having anxiety, unable to sleep- only 2 hours at a time, hearing voices, pt states he hears dead brother telling him to kill himself. Denies any homicidal or suicidal intent. Pt states has been taking all regular medicines.

## 2016-10-26 ENCOUNTER — Emergency Department (HOSPITAL_COMMUNITY)
Admission: EM | Admit: 2016-10-26 | Discharge: 2016-10-27 | Disposition: A | Discharge status: Home / Self Care | Payer: 59 | Attending: Emergency Medicine | Admitting: Emergency Medicine

## 2016-10-26 ENCOUNTER — Encounter (HOSPITAL_COMMUNITY): Payer: Self-pay

## 2016-10-26 DIAGNOSIS — R440 Auditory hallucinations: Secondary | ICD-10-CM | POA: Diagnosis not present

## 2016-10-26 DIAGNOSIS — N189 Chronic kidney disease, unspecified: Secondary | ICD-10-CM | POA: Diagnosis not present

## 2016-10-26 DIAGNOSIS — M6282 Rhabdomyolysis: Secondary | ICD-10-CM | POA: Diagnosis not present

## 2016-10-26 DIAGNOSIS — F3164 Bipolar disorder, current episode mixed, severe, with psychotic features: Secondary | ICD-10-CM | POA: Diagnosis present

## 2016-10-26 DIAGNOSIS — I129 Hypertensive chronic kidney disease with stage 1 through stage 4 chronic kidney disease, or unspecified chronic kidney disease: Secondary | ICD-10-CM | POA: Diagnosis not present

## 2016-10-26 DIAGNOSIS — F3110 Bipolar disorder, current episode manic without psychotic features, unspecified: Secondary | ICD-10-CM | POA: Diagnosis present

## 2016-10-26 DIAGNOSIS — R443 Hallucinations, unspecified: Secondary | ICD-10-CM

## 2016-10-26 DIAGNOSIS — F319 Bipolar disorder, unspecified: Secondary | ICD-10-CM | POA: Diagnosis not present

## 2016-10-26 DIAGNOSIS — Z87891 Personal history of nicotine dependence: Secondary | ICD-10-CM | POA: Diagnosis not present

## 2016-10-26 DIAGNOSIS — Z7982 Long term (current) use of aspirin: Secondary | ICD-10-CM | POA: Diagnosis not present

## 2016-10-26 DIAGNOSIS — Z79899 Other long term (current) drug therapy: Secondary | ICD-10-CM | POA: Diagnosis not present

## 2016-10-26 LAB — RAPID URINE DRUG SCREEN, HOSP PERFORMED
Amphetamines: NOT DETECTED
Barbiturates: NOT DETECTED
Benzodiazepines: POSITIVE — AB
Cocaine: NOT DETECTED
Opiates: NOT DETECTED
Tetrahydrocannabinol: POSITIVE — AB

## 2016-10-26 LAB — COMPREHENSIVE METABOLIC PANEL WITH GFR
ALT: 20 U/L (ref 17–63)
AST: 19 U/L (ref 15–41)
Albumin: 3.8 g/dL (ref 3.5–5.0)
Alkaline Phosphatase: 72 U/L (ref 38–126)
Anion gap: 10 (ref 5–15)
BUN: 19 mg/dL (ref 6–20)
CO2: 21 mmol/L — ABNORMAL LOW (ref 22–32)
Calcium: 9.3 mg/dL (ref 8.9–10.3)
Chloride: 109 mmol/L (ref 101–111)
Creatinine, Ser: 1.27 mg/dL — ABNORMAL HIGH (ref 0.61–1.24)
GFR calc Af Amer: >60 mL/min (ref >60)
GFR calc non Af Amer: >60 mL/min (ref >60)
Glucose, Bld: 96 mg/dL (ref 65–99)
Potassium: 3.6 mmol/L (ref 3.5–5.1)
Sodium: 140 mmol/L (ref 135–145)
Total Bilirubin: 0.3 mg/dL (ref 0.3–1.2)
Total Protein: 7.1 g/dL (ref 6.5–8.1)

## 2016-10-26 LAB — CBC WITH DIFFERENTIAL/PLATELET
Basophils Absolute: 0.1 K/uL (ref 0.0–0.1)
Basophils Relative: 1 %
Eosinophils Absolute: 0.3 K/uL (ref 0.0–0.7)
Eosinophils Relative: 3 %
HCT: 32.8 % — ABNORMAL LOW (ref 39.0–52.0)
Hemoglobin: 11.4 g/dL — ABNORMAL LOW (ref 13.0–17.0)
Lymphocytes Relative: 31 %
Lymphs Abs: 3.1 K/uL (ref 0.7–4.0)
MCH: 29.2 pg (ref 26.0–34.0)
MCHC: 34.8 g/dL (ref 30.0–36.0)
MCV: 84.1 fL (ref 78.0–100.0)
Monocytes Absolute: 0.9 K/uL (ref 0.1–1.0)
Monocytes Relative: 9 %
Neutro Abs: 5.5 K/uL (ref 1.7–7.7)
Neutrophils Relative %: 56 %
Platelets: 354 K/uL (ref 150–400)
RBC: 3.9 MIL/uL — ABNORMAL LOW (ref 4.22–5.81)
RDW: 13.4 % (ref 11.5–15.5)
WBC: 9.9 K/uL (ref 4.0–10.5)

## 2016-10-26 LAB — ETHANOL: Alcohol, Ethyl (B): <5 mg/dL (ref <5)

## 2016-10-26 MED ORDER — OXYCODONE HCL 5 MG PO TABS
10.0000 mg | ORAL_TABLET | Freq: Four times a day (QID) | ORAL | Status: DC
  Administered 2016-10-26 – 2016-10-27 (×5): 10 mg via ORAL
  Filled 2016-10-26 (×5): qty 2

## 2016-10-26 MED ORDER — LEVOTHYROXINE SODIUM 75 MCG PO TABS
75.0000 ug | ORAL_TABLET | Freq: Every day | ORAL | Status: DC
  Administered 2016-10-26 – 2016-10-27 (×2): 75 ug via ORAL
  Filled 2016-10-26 (×2): qty 1

## 2016-10-26 MED ORDER — LAMOTRIGINE 25 MG PO TABS
25.0000 mg | ORAL_TABLET | Freq: Two times a day (BID) | ORAL | Status: DC
  Administered 2016-10-26 – 2016-10-27 (×3): 25 mg via ORAL
  Filled 2016-10-26 (×3): qty 1

## 2016-10-26 MED ORDER — PROMETHAZINE HCL 25 MG PO TABS: 25.0000 mg | ORAL_TABLET | Freq: Four times a day (QID) | ORAL | Status: DC | PRN

## 2016-10-26 MED ORDER — BUSPIRONE HCL 10 MG PO TABS
15.0000 mg | ORAL_TABLET | Freq: Two times a day (BID) | ORAL | Status: DC
  Administered 2016-10-26 – 2016-10-27 (×3): 15 mg via ORAL
  Filled 2016-10-26 (×3): qty 2

## 2016-10-26 MED ORDER — ASPIRIN EC 81 MG PO TBEC
81.0000 mg | DELAYED_RELEASE_TABLET | Freq: Every day | ORAL | Status: DC
  Administered 2016-10-26 – 2016-10-27 (×2): 81 mg via ORAL
  Filled 2016-10-26 (×3): qty 1

## 2016-10-26 MED ORDER — OLANZAPINE 5 MG PO TABS
7.5000 mg | ORAL_TABLET | Freq: Every day | ORAL | Status: DC
  Administered 2016-10-26: 7.5 mg via ORAL
  Filled 2016-10-26: qty 1

## 2016-10-26 MED ORDER — AMLODIPINE BESYLATE 5 MG PO TABS
5.0000 mg | ORAL_TABLET | Freq: Every day | ORAL | Status: DC
  Administered 2016-10-26: 5 mg via ORAL
  Filled 2016-10-26: qty 1

## 2016-10-26 MED ORDER — PANTOPRAZOLE SODIUM 40 MG PO TBEC
40.0000 mg | DELAYED_RELEASE_TABLET | Freq: Every day | ORAL | Status: DC
  Administered 2016-10-26 – 2016-10-27 (×2): 40 mg via ORAL
  Filled 2016-10-26 (×2): qty 1

## 2016-10-26 MED ORDER — HYDROXYZINE HCL 25 MG PO TABS
25.0000 mg | ORAL_TABLET | Freq: Three times a day (TID) | ORAL | Status: DC | PRN
  Administered 2016-10-26 (×3): 25 mg via ORAL
  Filled 2016-10-26 (×3): qty 1

## 2016-10-26 MED ORDER — IBUPROFEN 200 MG PO TABS: 600.0000 mg | ORAL_TABLET | Freq: Three times a day (TID) | ORAL | Status: DC | PRN

## 2016-10-26 MED ORDER — ONDANSETRON HCL 4 MG PO TABS: 4.0000 mg | ORAL_TABLET | Freq: Three times a day (TID) | ORAL | Status: DC | PRN

## 2016-10-26 MED ORDER — METOPROLOL TARTRATE 25 MG PO TABS
50.0000 mg | ORAL_TABLET | Freq: Two times a day (BID) | ORAL | Status: DC
  Administered 2016-10-26 – 2016-10-27 (×3): 50 mg via ORAL
  Filled 2016-10-26 (×3): qty 2

## 2016-10-26 NOTE — ED Notes (Signed)
TTS consult in process at this time. 

## 2016-10-26 NOTE — BH Assessment (Addendum)
Tele Assessment Note   [name] is an 51 y.o. male who presents to the ED voluntarily due to increased depression and psychosis triggered by depression. Pt reports that he has been seeing deceased loved ones that tell him to kill himself. Pt reports the psychotic episodes occur approximately every 5 or 6 years. Pt reports he has not been sleeping due to the psychosis and sleeps for no more than 2 hours at a time for the past several days. Pt reports a hx of SI with an attempt in 2010. Pt reports in 2010 he was hearing voices that were telling him to kill himself so he went into the woods with a gun and was going to shoot himself in the head. Pt stated when he was about to shoot himself, the voices said "no don't do it!" and the pt was admitted to [facility] for treatment. Pt denies a current plan of suicide but states he does not feel safe at home and states he is unable to contract for safety. Pt stated "I'm scared. I don't know what I'm going to do to myself." Pt was asked if he has access to weapons and he stated "no, but I wish I did." Pt was asked to elaborate on what he meant by this statement and the pt stated "I'm an excellent shot and that would relieve some stress."  Pt is currently followed by Dr. Loni Muse for medication management and states he sees him every 2 months. Pt denies regular drug/alcohol use however he reports he used marijuana recently in an attempt to help him rest. Pt stated after he used the marijuana he was able to fall asleep. States this was the first time he used marijuana in almost 10 years.  Per Lindon Romp, NP pt meets criteria for inpt treatment. TTS to seek placement. Susette Racer, RN aware of disposition.   Diagnosis: MDD w/ psychotic features  Past Medical History:  Past Medical History:  Diagnosis Date  . Accelerated hypertension 08/23/2016  . Anxiety   . Arthritis   . Bipolar affective disorder ([place])   . Chronic back pain   . Chronic kidney disease    AKF - due  to Rhabdomyolysis  . DDD (degenerative disc disease)   . Depression   . DJD (degenerative joint disease)   . Elevated liver function tests   . Fibromyalgia   . GERD (gastroesophageal reflux disease)   . Head injury   . Head injury, closed, with concussion    x 3  . Hyperlipidemia   . Hypothyroidism   . IBS (irritable bowel syndrome)   . Inguinal hernia    left  . Insomnia   . Myofascial pain syndrome   . Osteopenia   . Shortness of breath dyspnea    with exertion  . Tibial plateau fracture, left     Past Surgical History:  Procedure Laterality Date  . CARDIAC CATHETERIZATION N/A 08/31/2015   Procedure: Left Heart Cath and Coronary Angiography;  Surgeon: Leonie Man, MD;  Location: [place] CV LAB;  Service: Cardiovascular;  Laterality: N/A;  . Carotid Dopplers Bilateral 02/20/2015   [facility]: Mild, less than 39% left and right internal carotid artery stenosis. No significant plaque burden  . CERVICAL DISC SURGERY     C5-7  . COLONOSCOPY    . COLONOSCOPY    . ESOPHAGOGASTRODUODENOSCOPY    . FASCIOTOMY Left 10/20/2013   Procedure: LEFT leg ANTERIOR COMPARTMENT FACSCIOTOMY;  Surgeon: Rozanna Box, MD;  Location: [place];  Service: Orthopedics;  Laterality: Left;  . INGUINAL HERNIA REPAIR Left   . Nuclear Stress Test  03/06/2015   [facility]: Normal EKG. Low normal EF (49%) normal regional wall motion. No evidence of ischemia or infarction.  . ORIF TIBIA PLATEAU Left 10/20/2013   Procedure: OPEN REDUCTION INTERNAL FIXATION (ORIF) LEFT TIBIAL PLATEAU;  Surgeon: Rozanna Box, MD;  Location: [place];  Service: Orthopedics;  Laterality: Left;  . SEPTOPLASTY    . SPINAL CORD STIMULATOR BATTERY EXCHANGE N/A 02/08/2016   Procedure: Lumbar spinal cord stimulator implantable pulse generator replacement;  Surgeon: Clydell Hakim, MD;  Location: [place] NEURO ORS;  Service: Neurosurgery;  Laterality: N/A;  . SPINAL CORD STIMULATOR IMPLANT    . TRANSTHORACIC  ECHOCARDIOGRAM  02/20/2015    [facility]: Mild concentric LVH. EF 55-60%. Normal regional wall motion. Mild to moderate TR with no significant pulmonary hypertension.    Family History:  Family History  Problem Relation Age of [place]  . Heart disease Mother   . Hypertension Mother   . Sudden death Mother        Presumably cardiac  . Hyperlipidemia Father   . Heart attack Father 51       At least 5 MIs. Had CABG.  . Heart failure Father   . Diabetes Father   . Hypertension Father   . Kidney disease Father   . Diabetes Brother   . Lung cancer Maternal Grandmother   . Diabetes Paternal Grandmother   . Sudden death Paternal Grandmother        Unclear etiology  . Diabetes Unknown   . Kidney disease Unknown   . Kidney disease Brother   . Hypertension Brother   . Sudden death Brother        Thought to be related to heart disease  . Throat cancer Brother   . Colon cancer Neg Hx     Social History:  reports that he has quit smoking. His smoking use included Cigarettes. He has a 35.00 pack-year smoking history. He has never used smokeless tobacco. He reports that he uses drugs, including Oxycodone and Cocaine. He reports that he does not drink alcohol.  Additional Social History:  Alcohol / Drug Use Pain Medications: See PTA meds Prescriptions: See PTA meds Over the Counter: See PTA meds History of alcohol / drug use?: Yes Longest period of sobriety (when/how long): 10 years Substance #1 Name of Substance 1: Marijuana 1 - Age of First Use: teens 1 - Amount (size/oz): pt stated he smoked "a piece of a joint" to help with sleep 1 - Frequency: 1st time in 10 years 1 - Duration: off and on for years 1 - Last Use / Amount: 10/24/16  CIWA: CIWA-Ar BP: 134/72 Pulse Rate: 87 COWS:    PATIENT STRENGTHS: (choose at least two) Average or above average intelligence Communication skills Financial means General fund of knowledge Motivation for treatment/growth Supportive  family/friends  Allergies:  Allergies  Allergen Reactions  . Zolpidem Tartrate Other (See Comments)    Hallucinations and sleep walks  . Demerol [Meperidine] Itching, Rash and Hives    Home Medications:  (Not in a hospital admission)  OB/GYN Status:  No LMP for male patient.  General Assessment Data Location of Assessment: [facility] ED TTS Assessment: In system Is this a Tele or Face-to-Face Assessment?: Face-to-Face Is this an Initial Assessment or a Re-assessment for this encounter?: Initial Assessment Marital status: Separated Is patient pregnant?: No Pregnancy Status: No Living  Arrangements: Alone Can pt return to current living arrangement?: Yes Admission Status: Voluntary Is patient capable of signing voluntary admission?: Yes Referral Source: Self/Family/Friend Insurance type: UMR     Crisis Care Plan Living Arrangements: Alone Name of Psychiatrist: Dr. Darleene Cleaver Name of Therapist: none  Education Status Is patient currently in school?: No Highest grade of school patient has completed: some college  Risk to self with the past 6 months Suicidal Ideation: Yes-Currently Present Has patient been a risk to self within the past 6 months prior to admission? : No Suicidal Intent: No Has patient had any suicidal intent within the past 6 months prior to admission? : No Is patient at risk for suicide?: Yes Suicidal Plan?: No Has patient had any suicidal plan within the past 6 months prior to admission? : No Access to Means: No What has been your use of drugs/alcohol within the last 12 months?: reports to self-medicating with marijuana several days ago  Previous Attempts/Gestures: Yes How many times?: 2 Triggers for Past Attempts: Hallucinations Intentional Self Injurious Behavior: None Family Suicide History: No Recent stressful life event(s): Divorce Persecutory voices/beliefs?: No Depression: Yes Depression Symptoms: Insomnia, Loss of interest in usual pleasures,  Feeling worthless/self pity Substance abuse history and/or treatment for substance abuse?: No Suicide prevention information given to non-admitted patients: Not applicable  Risk to Others within the past 6 months Homicidal Ideation: No Does patient have any lifetime risk of violence toward others beyond the six months prior to admission? : No Thoughts of Harm to Others: No Current Homicidal Intent: No Current Homicidal Plan: No Access to Homicidal Means: No History of harm to others?: No Assessment of Violence: None Noted Does patient have access to weapons?: No Criminal Charges Pending?: No Does patient have a court date: No Is patient on probation?: No  Psychosis Hallucinations: Auditory, Visual, With command Delusions: None noted  Mental Status Report Appearance/Hygiene: Unremarkable Eye Contact: Good Motor Activity: Unsteady Speech: Logical/coherent Level of Consciousness: Alert Mood: Depressed, Anxious, Helpless Affect: Anxious, Depressed Anxiety Level: Moderate Thought Processes: Coherent, Relevant Judgement: Partial Orientation: Person, Place, Time, Situation, Appropriate for developmental age Obsessive Compulsive Thoughts/Behaviors: None  Cognitive Functioning Concentration: Normal Memory: Remote Intact, Recent Intact IQ: Average Insight: Fair Impulse Control: Fair Appetite: Fair Sleep: Decreased Total Hours of Sleep: 2 Vegetative Symptoms: None  ADLScreening [facility] Assessment Services) Patient's cognitive ability adequate to safely complete daily activities?: Yes Patient able to express need for assistance with ADLs?: Yes Independently performs ADLs?: Yes (appropriate for developmental age)  Prior Inpatient Therapy Prior Inpatient Therapy: Yes Prior Therapy Dates: 2010, 2008 Prior Therapy Facilty/Provider(s): [facility] Reason for Treatment: SI  Prior Outpatient Therapy Prior Outpatient Therapy: Yes Prior Therapy Dates: current Prior Therapy  Facilty/Provider(s): Dr. Darleene Cleaver Reason for Treatment: Anxiety, med management, Bipolar D/O Does patient have an ACCT team?: No Does patient have Intensive In-House Services?  : No Does patient have [facility] services? : No Does patient have P4CC services?: No  ADL Screening (condition at time of admission) Patient's cognitive ability adequate to safely complete daily activities?: Yes Is the patient deaf or have difficulty hearing?: No Does the patient have difficulty seeing, even when wearing glasses/contacts?: No Does the patient have difficulty concentrating, remembering, or making decisions?: No Patient able to express need for assistance with ADLs?: Yes Does the patient have difficulty dressing or bathing?: No Independently performs ADLs?: Yes (appropriate for developmental age) Does the patient have difficulty walking or climbing stairs?: Yes Weakness of Legs: Both Weakness of Arms/Hands: None  Home Assistive Devices/Equipment Home Assistive Devices/Equipment: Cane (specify quad or straight)    Abuse/Neglect Assessment (Assessment to be complete while patient is alone) Physical Abuse: Denies Verbal Abuse: Denies Sexual Abuse: Denies Exploitation of patient/patient's resources: Denies Self-Neglect: Denies     Regulatory affairs officer (For Healthcare) Does Patient Have a Medical Advance Directive?: No Would patient like information on creating a medical advance directive?: No - Patient declined    Additional Information 1:1 In Past 12 Months?: No CIRT Risk: No Elopement Risk: No Does patient have medical clearance?: Yes     Disposition:  Disposition Initial Assessment Completed for this Encounter: Yes Disposition of Patient: Inpatient treatment program Type of inpatient treatment program: Adult (per Lindon Romp, NP)  Lyanne Co 10/26/2016 6:19 AM

## 2016-10-27 ENCOUNTER — Encounter (HOSPITAL_COMMUNITY): Payer: Self-pay | Admitting: Emergency Medicine

## 2016-10-27 ENCOUNTER — Encounter (HOSPITAL_COMMUNITY): Payer: Self-pay

## 2016-10-27 ENCOUNTER — Inpatient Hospital Stay (HOSPITAL_COMMUNITY)
Admission: AD | Admit: 2016-10-27 | Discharge: 2016-10-30 | DRG: 885 | Disposition: A | Discharge status: Home / Self Care | Payer: 59 | Source: Intra-hospital | Attending: Psychiatry | Admitting: Psychiatry

## 2016-10-27 DIAGNOSIS — N189 Chronic kidney disease, unspecified: Secondary | ICD-10-CM | POA: Diagnosis present

## 2016-10-27 DIAGNOSIS — G894 Chronic pain syndrome: Secondary | ICD-10-CM | POA: Diagnosis not present

## 2016-10-27 DIAGNOSIS — F39 Unspecified mood [affective] disorder: Secondary | ICD-10-CM

## 2016-10-27 DIAGNOSIS — M6282 Rhabdomyolysis: Secondary | ICD-10-CM | POA: Diagnosis not present

## 2016-10-27 DIAGNOSIS — F17210 Nicotine dependence, cigarettes, uncomplicated: Secondary | ICD-10-CM | POA: Diagnosis not present

## 2016-10-27 DIAGNOSIS — F3164 Bipolar disorder, current episode mixed, severe, with psychotic features: Principal | ICD-10-CM | POA: Diagnosis present

## 2016-10-27 DIAGNOSIS — G4700 Insomnia, unspecified: Secondary | ICD-10-CM | POA: Diagnosis not present

## 2016-10-27 DIAGNOSIS — Z87891 Personal history of nicotine dependence: Secondary | ICD-10-CM

## 2016-10-27 DIAGNOSIS — F419 Anxiety disorder, unspecified: Secondary | ICD-10-CM | POA: Diagnosis not present

## 2016-10-27 DIAGNOSIS — E039 Hypothyroidism, unspecified: Secondary | ICD-10-CM | POA: Diagnosis present

## 2016-10-27 DIAGNOSIS — E785 Hyperlipidemia, unspecified: Secondary | ICD-10-CM | POA: Diagnosis present

## 2016-10-27 DIAGNOSIS — R45851 Suicidal ideations: Secondary | ICD-10-CM | POA: Diagnosis not present

## 2016-10-27 DIAGNOSIS — R440 Auditory hallucinations: Secondary | ICD-10-CM | POA: Diagnosis not present

## 2016-10-27 DIAGNOSIS — F1490 Cocaine use, unspecified, uncomplicated: Secondary | ICD-10-CM

## 2016-10-27 DIAGNOSIS — F29 Unspecified psychosis not due to a substance or known physiological condition: Secondary | ICD-10-CM | POA: Diagnosis not present

## 2016-10-27 DIAGNOSIS — Z7982 Long term (current) use of aspirin: Secondary | ICD-10-CM | POA: Diagnosis not present

## 2016-10-27 DIAGNOSIS — F319 Bipolar disorder, unspecified: Secondary | ICD-10-CM | POA: Diagnosis not present

## 2016-10-27 DIAGNOSIS — F1210 Cannabis abuse, uncomplicated: Secondary | ICD-10-CM | POA: Diagnosis not present

## 2016-10-27 DIAGNOSIS — Z79899 Other long term (current) drug therapy: Secondary | ICD-10-CM | POA: Diagnosis not present

## 2016-10-27 DIAGNOSIS — Z760 Encounter for issue of repeat prescription: Secondary | ICD-10-CM | POA: Diagnosis not present

## 2016-10-27 DIAGNOSIS — F17209 Nicotine dependence, unspecified, with unspecified nicotine-induced disorders: Secondary | ICD-10-CM | POA: Diagnosis present

## 2016-10-27 DIAGNOSIS — Z8249 Family history of ischemic heart disease and other diseases of the circulatory system: Secondary | ICD-10-CM

## 2016-10-27 DIAGNOSIS — I129 Hypertensive chronic kidney disease with stage 1 through stage 4 chronic kidney disease, or unspecified chronic kidney disease: Secondary | ICD-10-CM | POA: Diagnosis not present

## 2016-10-27 DIAGNOSIS — F314 Bipolar disorder, current episode depressed, severe, without psychotic features: Secondary | ICD-10-CM | POA: Diagnosis present

## 2016-10-27 DIAGNOSIS — F1190 Opioid use, unspecified, uncomplicated: Secondary | ICD-10-CM | POA: Diagnosis not present

## 2016-10-27 DIAGNOSIS — F3110 Bipolar disorder, current episode manic without psychotic features, unspecified: Secondary | ICD-10-CM | POA: Diagnosis present

## 2016-10-27 MED ORDER — MAGNESIUM HYDROXIDE 400 MG/5ML PO SUSP: 30.0000 mL | Freq: Every day | ORAL | Status: DC | PRN

## 2016-10-27 MED ORDER — BUSPIRONE HCL 15 MG PO TABS
15.0000 mg | ORAL_TABLET | Freq: Two times a day (BID) | ORAL | Status: DC
  Administered 2016-10-27 – 2016-10-30 (×6): 15 mg via ORAL
  Filled 2016-10-27 (×11): qty 1

## 2016-10-27 MED ORDER — ACETAMINOPHEN 325 MG PO TABS: 650.0000 mg | ORAL_TABLET | Freq: Four times a day (QID) | ORAL | Status: DC | PRN

## 2016-10-27 MED ORDER — IBUPROFEN 600 MG PO TABS: 600.0000 mg | ORAL_TABLET | Freq: Three times a day (TID) | ORAL | Status: DC | PRN

## 2016-10-27 MED ORDER — METOPROLOL TARTRATE 50 MG PO TABS
50.0000 mg | ORAL_TABLET | Freq: Two times a day (BID) | ORAL | Status: DC
  Administered 2016-10-28 – 2016-10-30 (×5): 50 mg via ORAL
  Filled 2016-10-27 (×11): qty 1

## 2016-10-27 MED ORDER — OLANZAPINE 10 MG PO TABS: 10.0000 mg | ORAL_TABLET | Freq: Every day | ORAL | Status: DC

## 2016-10-27 MED ORDER — ONDANSETRON HCL 4 MG PO TABS: 4.0000 mg | ORAL_TABLET | Freq: Three times a day (TID) | ORAL | Status: DC | PRN

## 2016-10-27 MED ORDER — PANTOPRAZOLE SODIUM 40 MG PO TBEC
40.0000 mg | DELAYED_RELEASE_TABLET | Freq: Every day | ORAL | Status: DC
  Administered 2016-10-28 – 2016-10-30 (×3): 40 mg via ORAL
  Filled 2016-10-27 (×5): qty 1

## 2016-10-27 MED ORDER — OXYCODONE HCL 5 MG PO TABS
10.0000 mg | ORAL_TABLET | Freq: Three times a day (TID) | ORAL | Status: DC | PRN
  Administered 2016-10-27 – 2016-10-28 (×4): 10 mg via ORAL
  Filled 2016-10-27 (×4): qty 2

## 2016-10-27 MED ORDER — OXYCODONE HCL 5 MG PO TABS
10.0000 mg | ORAL_TABLET | Freq: Three times a day (TID) | ORAL | Status: DC | PRN
  Administered 2016-10-27: 10 mg via ORAL
  Filled 2016-10-27: qty 2

## 2016-10-27 MED ORDER — LAMOTRIGINE 25 MG PO TABS
25.0000 mg | ORAL_TABLET | Freq: Two times a day (BID) | ORAL | Status: DC
  Administered 2016-10-27 – 2016-10-28 (×2): 25 mg via ORAL
  Filled 2016-10-27 (×5): qty 1

## 2016-10-27 MED ORDER — ASPIRIN EC 81 MG PO TBEC
81.0000 mg | DELAYED_RELEASE_TABLET | Freq: Every day | ORAL | Status: DC
  Administered 2016-10-28 – 2016-10-30 (×3): 81 mg via ORAL
  Filled 2016-10-27 (×5): qty 1

## 2016-10-27 MED ORDER — ALUM & MAG HYDROXIDE-SIMETH 200-200-20 MG/5ML PO SUSP: 30.0000 mL | ORAL | Status: DC | PRN

## 2016-10-27 MED ORDER — OXYCODONE HCL 5 MG PO TABS: 10.0000 mg | ORAL_TABLET | Freq: Three times a day (TID) | ORAL | Status: DC

## 2016-10-27 MED ORDER — LEVOTHYROXINE SODIUM 75 MCG PO TABS
75.0000 ug | ORAL_TABLET | Freq: Every day | ORAL | Status: DC
  Administered 2016-10-28 – 2016-10-30 (×3): 75 ug via ORAL
  Filled 2016-10-27 (×6): qty 1

## 2016-10-27 MED ORDER — OLANZAPINE 10 MG PO TABS
10.0000 mg | ORAL_TABLET | Freq: Every day | ORAL | Status: DC
  Administered 2016-10-27: 10 mg via ORAL
  Filled 2016-10-27 (×3): qty 1

## 2016-10-27 MED ORDER — HYDROXYZINE HCL 25 MG PO TABS
25.0000 mg | ORAL_TABLET | Freq: Three times a day (TID) | ORAL | Status: DC | PRN
  Administered 2016-10-27 – 2016-10-30 (×8): 25 mg via ORAL
  Filled 2016-10-27 (×6): qty 1

## 2016-10-27 MED ORDER — AMLODIPINE BESYLATE 5 MG PO TABS
5.0000 mg | ORAL_TABLET | Freq: Every day | ORAL | Status: DC
  Administered 2016-10-27 – 2016-10-29 (×3): 5 mg via ORAL
  Filled 2016-10-27 (×6): qty 1

## 2016-10-27 NOTE — BH Assessment (Addendum)
[place] Assessment Progress Note  Per Corena Pilgrim, MD, this pt requires psychiatric hospitalization at this time.  Leonia Reader, RN, [facility] has pre-assigned pt to [facility] Rm [id number]; she will call when the bed is ready.  Pt has signed Voluntary Admission and Consent for Treatment, as well as Consent to Release Information to Dr Darleene Cleaver, his outpatient provider, as well as his wife, and signed forms have been faxed to [facility].  Pt's nurse has been notified, and agrees to send original paperwork along with pt via [place], and to call report to [phone].  Jalene Mullet, MA Triage Specialist [phone]  Addendum:  Leonia Reader, RN, [facility], calls to report that they will be ready to receive pt at 14:30.  Pt's nurse has been notified.  Jalene Mullet, [place] Triage Specialist [phone]

## 2016-10-27 NOTE — ED Provider Notes (Signed)
[facility] DEPT Provider Note   CSN: [id number] Arrival date & time: 10/25/16  2146     History   Chief Complaint Chief Complaint  Patient presents with  . Anxiety  . Hallucinations    HPI [name] is a 51 y.o. male.  HPI 51 year old male with a history of anxiety, bipolar disease who presents to the ED with 3 days of worsening anxiety, auditory and visual hallucinations. Patient reports that he has had these psychotic episodes in the past. States that he sees several people in the room having conversations. Some of the voices that he hears tell him to hurt himself. Currently he is denying any suicidal ideations. He denied any homicidal ideations. Denies any drug use but does admit to using marijuana several days ago because his regular medication was not helping. He reports that he is compliant with his psychiatric medications as well too.  Denies any recent fevers, illnesses, infections. Denies any other physical complaints. Past Medical History:  Diagnosis Date  . Accelerated hypertension 08/23/2016  . Anxiety   . Arthritis   . Bipolar affective disorder ([place])   . Chronic back pain   . Chronic kidney disease    AKF - due to Rhabdomyolysis  . DDD (degenerative disc disease)   . Depression   . DJD (degenerative joint disease)   . Elevated liver function tests   . Fibromyalgia   . GERD (gastroesophageal reflux disease)   . Head injury   . Head injury, closed, with concussion    x 3  . Hyperlipidemia   . Hypothyroidism   . IBS (irritable bowel syndrome)   . Inguinal hernia    left  . Insomnia   . Myofascial pain syndrome   . Osteopenia   . Shortness of breath dyspnea    with exertion  . Tibial plateau fracture, left     Patient Active Problem List   Diagnosis Date Noted  . Rhabdomyolysis 08/23/2016  . AKI (acute kidney injury) ([place]) 08/23/2016  . Transaminitis 08/23/2016  . Drug overdose, multiple drugs 08/23/2016  . Tachycardia 08/23/2016  .  Accelerated hypertension 08/23/2016  . Bipolar disorder, curr episode mixed, severe, with psychotic features ([place]) 12/26/2015  . Cocaine use disorder, mild, abuse 12/26/2015  . History of rhabdomyolysis 12/26/2015  . Liver injury 12/26/2015  . Hx of renal failure 12/26/2015  . Angina, class III ([place]) 08/18/2015  . Dyslipidemia, goal LDL below 70 08/18/2015  . Family history of premature coronary artery disease 08/18/2015  . Tobacco use disorder, continuous 08/18/2015  . Tibial plateau fracture 10/02/2013  . Multiple falls 10/02/2013  . Alteration in self-care ability 10/02/2013  . Hypothyroidism, lithium induced 10/02/2013  . Fracture of tibia with fibula, left, closed 10/02/2013  . Chronic back pain 10/02/2013  . Spinal cord stimulator, status-post 10/02/2013  . History of suicidal ideation 10/02/2013  . Postural imbalance with levoscoliosis, h/o 10/02/2013  . Chest pain 12/25/2012  . IRRITABLE BOWEL SYNDROME 08/09/2008  . HYPERLIPIDEMIA 10/28/2006  . DISORDER, BIPOLAR NOS 10/28/2006  . ANXIETY 10/28/2006  . DEPRESSION 10/28/2006  . PAIN, CHRONIC NEC 10/28/2006  . GERD 10/28/2006    Past Surgical History:  Procedure Laterality Date  . CARDIAC CATHETERIZATION N/A 08/31/2015   Procedure: Left Heart Cath and Coronary Angiography;  Surgeon: Leonie Man, MD;  Location: [facility];  Service: Cardiovascular;  Laterality: N/A;  . Carotid Dopplers Bilateral 02/20/2015   [facility]: Mild, less than 39% left and right internal carotid artery  stenosis. No significant plaque burden  . CERVICAL DISC SURGERY     C5-7  . COLONOSCOPY    . COLONOSCOPY    . ESOPHAGOGASTRODUODENOSCOPY    . FASCIOTOMY Left 10/20/2013   Procedure: LEFT leg ANTERIOR COMPARTMENT FACSCIOTOMY;  Surgeon: Rozanna Box, MD;  Location: [facility];  Service: Orthopedics;  Laterality: Left;  . INGUINAL HERNIA REPAIR Left   . Nuclear Stress Test  03/06/2015   [facility]: Normal EKG. Low  normal EF (49%) normal regional wall motion. No evidence of ischemia or infarction.  . ORIF TIBIA PLATEAU Left 10/20/2013   Procedure: OPEN REDUCTION INTERNAL FIXATION (ORIF) LEFT TIBIAL PLATEAU;  Surgeon: Rozanna Box, MD;  Location: [facility];  Service: Orthopedics;  Laterality: Left;  . SEPTOPLASTY    . SPINAL CORD STIMULATOR BATTERY EXCHANGE N/A 02/08/2016   Procedure: Lumbar spinal cord stimulator implantable pulse generator replacement;  Surgeon: Clydell Hakim, MD;  Location: [facility];  Service: Neurosurgery;  Laterality: N/A;  . SPINAL CORD STIMULATOR IMPLANT    . TRANSTHORACIC ECHOCARDIOGRAM  02/20/2015   [facility]: Mild concentric LVH. EF 55-60%. Normal regional wall motion. Mild to moderate TR with no significant pulmonary hypertension.       Home Medications    Prior to Admission medications   Medication Sig Start Date End Date Taking? Authorizing Provider  amLODipine (NORVASC) 5 MG tablet Take 1 tablet (5 mg total) by mouth at bedtime. 09/09/16  Yes Mikhail, Velta Addison, DO  aspirin EC 81 MG tablet Take 81 mg by mouth daily.   Yes [provider]  busPIRone (BUSPAR) 15 MG tablet Take 15 mg by mouth 2 (two) times daily.   Yes [provider]  esomeprazole (NEXIUM) 40 MG capsule Take 40 mg by mouth daily at 12 noon.   Yes [provider]  hydrOXYzine (ATARAX/VISTARIL) 25 MG tablet Take 1 tablet (25 mg total) by mouth every 8 (eight) hours as needed. Patient taking differently: Take 50 mg by mouth 3 (three) times daily.  09/09/16  Yes Mikhail, Velta Addison, DO  lamoTRIgine (LAMICTAL) 25 MG tablet Take 25 mg by mouth 2 (two) times daily.   Yes [provider]  levothyroxine (SYNTHROID, LEVOTHROID) 75 MCG tablet Take 75 mcg by mouth daily before breakfast.   Yes [provider]  metoprolol (LOPRESSOR) 50 MG tablet Take 1 tablet (50 mg total) by mouth 2 (two) times daily. Patient taking differently: Take 25 mg by mouth 2 (two) times  daily.  09/09/16  Yes Mikhail, Velta Addison, DO  nitroGLYCERIN (NITROSTAT) 0.4 MG SL tablet Place 0.4 mg under the tongue every 5 (five) hours as needed for chest pain.  06/23/16  Yes [provider]  OLANZapine (ZYPREXA) 7.5 MG tablet Take 7.5 mg by mouth at bedtime.   Yes [provider]  Oxycodone HCl 10 MG TABS Take 10 mg by mouth 4 (four) times daily.  10/16/16  Yes [provider]  promethazine (PHENERGAN) 25 MG tablet Take 1 tablet (25 mg total) by mouth every 6 (six) hours as needed for nausea or vomiting. 09/09/16  Yes Mikhail, [place], DO  Vitamin D, Ergocalciferol, (DRISDOL) 50000 units CAPS capsule Take 50,000 Units by mouth every 7 (seven) days.   Yes [provider]  multivitamin (RENA-VIT) TABS tablet Take 1 tablet by mouth at bedtime. Patient not taking: Reported on 10/26/2016 09/09/16   Cristal Ford, DO  Nutritional Supplements (FEEDING SUPPLEMENT, NEPRO CARB STEADY,) LIQD Take 237 mLs by mouth 2 (two) times daily  between meals. Patient not taking: Reported on 09/10/2016 09/09/16   Cristal Ford, DO    Family History Family History  Problem Relation Age of Onset  . Heart disease Mother   . Hypertension Mother   . Sudden death Mother        Presumably cardiac  . Hyperlipidemia Father   . Heart attack Father 19       At least 5 MIs. Had CABG.  . Heart failure Father   . Diabetes Father   . Hypertension Father   . Kidney disease Father   . Diabetes Brother   . Lung cancer Maternal Grandmother   . Diabetes Paternal Grandmother   . Sudden death Paternal Grandmother        Unclear etiology  . Diabetes Unknown   . Kidney disease Unknown   . Kidney disease Brother   . Hypertension Brother   . Sudden death Brother        Thought to be related to heart disease  . Throat cancer Brother   . Colon cancer Neg Hx     Social History Social History  Substance Use Topics  . Smoking status: Former Smoker    Packs/day: 1.00    Years: 35.00     Types: Cigarettes  . Smokeless tobacco: Never Used     Comment: 2018  . Alcohol use No     Allergies   Zolpidem tartrate and Demerol [meperidine]   Review of Systems Review of Systems  All other systems are reviewed and are negative for acute change except as noted in the HPI  Physical Exam Updated Vital Signs BP 107/80 (BP Location: Left Arm)   Pulse (!) 58   Temp 97.9 F (36.6 C) (Oral)   Resp 17   Wt 99.3 kg (219 lb)   SpO2 98%   BMI 29.70 kg/m   Physical Exam  Constitutional: He is oriented to person, place, and time. He appears well-developed and well-nourished. No distress.  HENT:  Head: Normocephalic and atraumatic.  Right Ear: External ear normal.  Left Ear: External ear normal.  Nose: Nose normal.  Mouth/Throat: Mucous membranes are normal. No trismus in the jaw.  Eyes: Conjunctivae and EOM are normal. No scleral icterus.  Neck: Normal range of motion and phonation normal.  Cardiovascular: Normal rate and regular rhythm.   Pulmonary/Chest: Effort normal. No stridor. No respiratory distress.  Abdominal: He exhibits no distension.  Musculoskeletal: Normal range of motion. He exhibits no edema.  Neurological: He is alert and oriented to person, place, and time.  Skin: He is not diaphoretic.  Psychiatric: He has a normal mood and affect. His behavior is normal.  Vitals reviewed.    ED Treatments / Results  Labs (all labs ordered are listed, but only abnormal results are displayed) Labs Reviewed  COMPREHENSIVE METABOLIC PANEL - Abnormal; Notable for the following:       Result Value   CO2 21 (*)    Creatinine, Ser 1.27 (*)    All other components within normal limits  RAPID URINE DRUG SCREEN, HOSP PERFORMED - Abnormal; Notable for the following:    Benzodiazepines POSITIVE (*)    Tetrahydrocannabinol POSITIVE (*)    All other components within normal limits  CBC WITH DIFFERENTIAL/PLATELET - Abnormal; Notable for the following:    RBC 3.90 (*)     Hemoglobin 11.4 (*)    HCT 32.8 (*)    All other components within normal limits  ETHANOL    EKG  EKG Interpretation  None       Radiology No results found.  Procedures Procedures (including critical care time)  Medications Ordered in ED Medications  ibuprofen (ADVIL,MOTRIN) tablet 600 mg (not administered)  ondansetron (ZOFRAN) tablet 4 mg (not administered)  amLODipine (NORVASC) tablet 5 mg (5 mg Oral Given 10/26/16 2149)  aspirin EC tablet 81 mg (81 mg Oral Given 10/26/16 1021)  busPIRone (BUSPAR) tablet 15 mg (15 mg Oral Given 10/26/16 2149)  pantoprazole (PROTONIX) EC tablet 40 mg (40 mg Oral Given 10/26/16 1012)  hydrOXYzine (ATARAX/VISTARIL) tablet 25 mg (25 mg Oral Given 10/26/16 2356)  lamoTRIgine (LAMICTAL) tablet 25 mg (25 mg Oral Given 10/26/16 2150)  levothyroxine (SYNTHROID, LEVOTHROID) tablet 75 mcg (75 mcg Oral Given 10/26/16 0731)  metoprolol tartrate (LOPRESSOR) tablet 50 mg (50 mg Oral Given 10/26/16 2149)  OLANZapine (ZYPREXA) tablet 7.5 mg (7.5 mg Oral Given 10/26/16 2149)  oxyCODONE (Oxy IR/ROXICODONE) immediate release tablet 10 mg (10 mg Oral Given 10/26/16 2356)  promethazine (PHENERGAN) tablet 25 mg (not administered)     Initial Impression / Assessment and Plan / ED Course  I have reviewed the triage vital signs and the nursing notes.  Pertinent labs & imaging results that were available during my care of the patient were reviewed by me and considered in my medical decision making (see chart for details).     Presentation concerning for acute psychotic episode. [facility] the patient and recommended inpatient management. Currently he is medically cleared and appropriate for further psychiatric management including transfer.  Final Clinical Impressions(s) / ED Diagnoses   Final diagnoses:  Hallucinations      Cardama, Grayce Sessions, MD 10/27/16 0003

## 2016-10-27 NOTE — Progress Notes (Signed)
Adult Psychoeducational Group Note  Date:  10/27/2016 Time:  9:48 PM  Group Topic/Focus:  Wrap-Up Group:   The focus of this group is to help patients review their daily goal of treatment and discuss progress on daily workbooks.  Participation Level:  Active  Participation Quality:  Appropriate and Attentive  Affect:  Appropriate  Cognitive:  Alert and Appropriate  Insight: Appropriate and Good  Engagement in Group:  Engaged  Modes of Intervention:  Discussion  Additional Comments: Patient stated his goal was to get settle into the program because it was his first day. Patient stated he achieved his goal for today. Patient rated his day a 7 out of 10 for today. Patient state something positive that happen today was he was able to leave the ED and get into a room here. Patient stated he wanted to get his medication right tomorrow.  Candy Sledge 10/27/2016, 9:48 PM

## 2016-10-27 NOTE — Progress Notes (Signed)
Admission note:  [name], 50, was admitted to [facility] hall for help with AVH. He says he is experiencing increasing anxiety is hearing voices that tell him to hurt himself. He says he is seeing people in the room. "There's about ten people in the room with [place] right now," he says. Pt denies SI or HI and says he is able to contract for safety. He is polite and pleasant. He is walking with the aid of a cane (he will use a walker on the unit). He says he has a lot of chronic pain issues: arthritis, chronic pain syndrome, a healed broken leg, etc. He had a spinal cord nerve stimulator implanted in 2011 to help deal with this, and he also takes pain medication. He has quit smoking recently. He declined the nicotine patch/gum.   [name] lives alone but counts his wife, from whom he's been separated about 8 years, and daughter as sources of support. A skin search was done that showed no contraband -- only some acne and a well-healed scar from the stimulator. He denied any history of cutting.   He has been pleasant and cooperative since arriving on the unit and is now with peers at lunch. Fifteen-minute safety checks remain in place. Will continue to monitor for needs/safety.

## 2016-10-27 NOTE — Tx Team (Addendum)
Initial Treatment Plan 10/27/2016 6:04 PM [name] [id number]    PATIENT STRESSORS: Health problems Medication change or noncompliance   PATIENT STRENGTHS: Ability for insight Average or above average intelligence Capable of independent living General fund of knowledge Motivation for treatment/growth   PATIENT IDENTIFIED PROBLEMS: "keeping my medication on schedule"  "getting on a normal schedule overall"  "I'd like to work on my people skills."  depression  anxiety  A/v hallucinations           DISCHARGE CRITERIA:  Improved stabilization in mood, thinking, and/or behavior  PRELIMINARY DISCHARGE PLAN: Outpatient therapy  PATIENT/FAMILY INVOLVEMENT: This treatment plan has been presented to and reviewed with the patient, [name].  The patient and family have been given the opportunity to ask questions and make suggestions.  Marya Landry, RN 10/27/2016, 6:04 PM

## 2016-10-27 NOTE — Consult Note (Signed)
[facility] Face-to-Face Psychiatry Consult   Reason for Consult:  Psychiatric evaluation Referring Physician:  EDP Patient Identification: [name] MRN:  [id number] Principal Diagnosis: Bipolar disorder, curr episode mixed, severe, with psychotic features [facility]) Diagnosis:   Patient Active Problem List   Diagnosis Date Noted  . Bipolar disorder, curr episode mixed, severe, with psychotic features ([place]) [F31.64] 12/26/2015    Priority: High  . Rhabdomyolysis [M62.82] 08/23/2016  . AKI (acute kidney injury) ([place]) [N17.9] 08/23/2016  . Transaminitis [R74.0] 08/23/2016  . Drug overdose, multiple drugs [T50.901A] 08/23/2016  . Tachycardia [R00.0] 08/23/2016  . Accelerated hypertension [I10] 08/23/2016  . Cocaine use disorder, mild, abuse [F14.10] 12/26/2015  . History of rhabdomyolysis [Z87.39] 12/26/2015  . Liver injury [S36.119A] 12/26/2015  . Hx of renal failure [Z87.448] 12/26/2015  . Angina, class III ([place]) [I20.9] 08/18/2015  . Dyslipidemia, goal LDL below 70 [E78.5] 08/18/2015  . Family history of premature coronary artery disease [Z82.49] 08/18/2015  . Tobacco use disorder, continuous [F17.209] 08/18/2015  . Tibial plateau fracture [S82.143A] 10/02/2013  . Multiple falls [R29.6] 10/02/2013  . Alteration in self-care ability [R53.81] 10/02/2013  . 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 Fracture of tibia with fibula, left, closed [S82.202A, S82.402A] 10/02/2013  . Chronic back pain [M54.9, G89.29] 10/02/2013  . 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 History of suicidal ideation [Z86.59] 10/02/2013  . Postural imbalance with levoscoliosis, h/o [R29.3] 10/02/2013  . Chest pain [R07.9] 12/25/2012  . IRRITABLE BOWEL SYNDROME [K58.9] 08/09/2008  . HYPERLIPIDEMIA [E78.5] 10/28/2006  . DISORDER, BIPOLAR NOS [F31.9] 10/28/2006  . ANXIETY [F41.1] 10/28/2006  . DEPRESSION [F32.9] 10/28/2006  . PAIN, CHRONIC NEC [G89.29] 10/28/2006  . GERD  [K21.9] 10/28/2006    Total Time spent with patient: 45 minutes  Subjective:   [name] is a 51 y.o. male patient admitted with psychosis and suicidal thoughts.  HPI:  Patient with history of Bipolar depression, Anxiety, who reports worsening depression, trouble sleeping and psychosis. Patient reports hearing voices telling him to kill himself and seeing his deceased family members. Patient reports that he tried to smoke Cannabis to helm him sleep and since then he has become psychotic.  Past Psychiatric History: as above  Risk to Self: Suicidal Ideation: Yes-Currently Present Suicidal Intent: No Is patient at risk for suicide?: Yes Suicidal Plan?: No Access to Means: No What has been your use of drugs/alcohol within the last 12 months?: reports to self-medicating with marijuana several days ago  How many times?: 2 Triggers for Past Attempts: Hallucinations Intentional Self Injurious Behavior: None Risk to Others: Homicidal Ideation: No Thoughts of Harm to Others: No Current Homicidal Intent: No Current Homicidal Plan: No Access to Homicidal Means: No History of harm to others?: No Assessment of Violence: None Noted Does patient have access to weapons?: No Criminal Charges Pending?: No Does patient have a court date: No Prior Inpatient Therapy: Prior Inpatient Therapy: Yes Prior Therapy Dates: 2010, 2008 Prior Therapy Facilty/Provider([facility]): [facility] Reason for Treatment: SI Prior Outpatient Therapy: Prior Outpatient Therapy: Yes Prior Therapy Dates: current Prior Therapy Facilty/Provider(s): Dr. Darleene Cleaver Reason for Treatment: Anxiety, med management, Bipolar D/O Does patient have an ACCT team?: No Does patient have Intensive In-House Services?  : No Does patient have [facility] services? : No Does patient have P4CC services?: No  Past Medical History:  Past Medical History:  Diagnosis Date  . Accelerated hypertension 08/23/2016  . Anxiety   . Arthritis   . Bipolar  affective disorder ([place])   .  Chronic back pain   . Chronic kidney disease    AKF - due to Rhabdomyolysis  . DDD (degenerative disc disease)   . Depression   . DJD (degenerative joint disease)   . Elevated liver function tests   . Fibromyalgia   . GERD (gastroesophageal reflux disease)   . Head injury   . Head injury, closed, with concussion    x 3  . Hyperlipidemia   . Hypothyroidism   . IBS (irritable bowel syndrome)   . Inguinal hernia    left  . Insomnia   . Myofascial pain syndrome   . Osteopenia   . Shortness of breath dyspnea    with exertion  . Tibial plateau fracture, left     Past Surgical History:  Procedure Laterality Date  . CARDIAC CATHETERIZATION N/A 08/31/2015   Procedure: Left Heart Cath and Coronary Angiography;  Surgeon: Leonie Man, MD;  Location: [place] [facility];  Service: Cardiovascular;  Laterality: N/A;  . Carotid Dopplers Bilateral 02/20/2015   [facility]: Mild, less than 39% left and right internal carotid artery stenosis. No significant plaque burden  . CERVICAL DISC SURGERY     C5-7  . COLONOSCOPY    . COLONOSCOPY    . ESOPHAGOGASTRODUODENOSCOPY    . FASCIOTOMY Left 10/20/2013   Procedure: LEFT leg ANTERIOR COMPARTMENT FACSCIOTOMY;  Surgeon: Rozanna Box, MD;  Location: [place];  Service: Orthopedics;  Laterality: Left;  . INGUINAL HERNIA REPAIR Left   . Nuclear Stress Test  03/06/2015   [facility]: Normal EKG. Low normal EF (49%) normal regional wall motion. No evidence of ischemia or infarction.  . ORIF TIBIA PLATEAU Left 10/20/2013   Procedure: OPEN REDUCTION INTERNAL FIXATION (ORIF) LEFT TIBIAL PLATEAU;  Surgeon: Rozanna Box, MD;  Location: [place];  Service: Orthopedics;  Laterality: Left;  . SEPTOPLASTY    . SPINAL CORD STIMULATOR BATTERY EXCHANGE N/A 02/08/2016   Procedure: Lumbar spinal cord stimulator implantable pulse generator replacement;  Surgeon: Clydell Hakim, MD;  Location: [place] NEURO ORS;  Service:  Neurosurgery;  Laterality: N/A;  . SPINAL CORD STIMULATOR IMPLANT    . TRANSTHORACIC ECHOCARDIOGRAM  02/20/2015   [facility]: Mild concentric LVH. EF 55-60%. Normal regional wall motion. Mild to moderate TR with no significant pulmonary hypertension.   Family History:  Family History  Problem Relation Age of Onset  . Heart disease Mother   . Hypertension Mother   . Sudden death Mother        Presumably cardiac  . Hyperlipidemia Father   . Heart attack Father 68       At least 5 MIs. Had CABG.  . Heart failure Father   . Diabetes Father   . Hypertension Father   . Kidney disease Father   . Diabetes Brother   . Lung cancer Maternal Grandmother   . Diabetes Paternal Grandmother   . Sudden death Paternal Grandmother        Unclear etiology  . Diabetes Unknown   . Kidney disease Unknown   . Kidney disease Brother   . Hypertension Brother   . Sudden death Brother        Thought to be related to heart disease  . Throat cancer Brother   . Colon cancer Neg Hx    Family Psychiatric  History:  Social History:  History  Alcohol Use No     History  Drug Use  . Types: Oxycodone, Cocaine    Comment: rarely- last  38-    Social History   Social History  . Marital status: Married    Spouse name: N/A  . Number of children: 1  . Years of education: N/A   Occupational History  . disabled    Social History Main Topics  . Smoking status: Former Smoker    Packs/day: 1.00    Years: 35.00    Types: Cigarettes  . Smokeless tobacco: Never Used     Comment: 2018  . Alcohol use No  . Drug use: Yes    Types: Oxycodone, Cocaine     Comment: rarely- last 2015-  . Sexual activity: Not Asked   Other Topics Concern  . None   Social History Narrative   He is a separated father of 1. His daughter who is in nursing school lives with him part-time.   He is currently disabled due to chronic back pain.    He did some college education.   He currently is trying to use  electronic cigarettes but has smoked one pack a day for over 35 years.   He has recently stopped using prescription benzodiazepine and pain medications because he is in the process of changing pain med doctors.   He does not routinely exercise secondary to back, neck pain as well as dyspnea.   Additional Social History:    Allergies:   Allergies  Allergen Reactions  . Zolpidem Tartrate Other (See Comments)    Hallucinations and sleep walks  . Demerol [Meperidine] Itching, Rash and Hives    Labs:  Results for orders placed or performed during the hospital encounter of 10/26/16 (from the past 48 hour(s))  Comprehensive metabolic panel     Status: Abnormal   Collection Time: 10/26/16  4:41 AM  Result Value Ref Range   Sodium 140 135 - 145 mmol/L   Potassium 3.6 3.5 - 5.1 mmol/L   Chloride 109 101 - 111 mmol/L   CO2 21 (L) 22 - 32 mmol/L   Glucose, Bld 96 65 - 99 mg/dL   BUN 19 6 - 20 mg/dL   Creatinine, Ser 1.27 (H) 0.61 - 1.24 mg/dL   Calcium 9.3 8.9 - 10.3 mg/dL   Total Protein 7.1 6.5 - 8.1 g/dL   Albumin 3.8 3.5 - 5.0 g/dL   AST 19 15 - 41 U/L   ALT 20 17 - 63 U/L   Alkaline Phosphatase 72 38 - 126 U/L   Total Bilirubin 0.3 0.3 - 1.2 mg/dL   GFR calc non Af Amer >60 >60 mL/min   GFR calc Af Amer >60 >60 mL/min    Comment: (NOTE) The eGFR has been calculated using the CKD EPI equation. This calculation has not been validated in all clinical situations. eGFR's persistently <60 mL/min signify possible Chronic Kidney Disease.    Anion gap 10 5 - 15  Ethanol     Status: None   Collection Time: 10/26/16  4:41 AM  Result Value Ref Range   Alcohol, Ethyl (B) <5 <5 mg/dL    Comment:        LOWEST DETECTABLE LIMIT FOR SERUM ALCOHOL IS 5 mg/dL FOR MEDICAL PURPOSES ONLY   CBC with Diff     Status: Abnormal   Collection Time: 10/26/16  4:41 AM  Result Value Ref Range   WBC 9.9 4.0 - 10.5 K/uL   RBC 3.90 (L) 4.22 - 5.81 MIL/uL   Hemoglobin 11.4 (L) 13.0 - 17.0 g/dL   HCT  32.8 (L) 39.0 - 52.0 %  MCV 84.1 78.0 - 100.0 fL   MCH 29.2 26.0 - 34.0 pg   MCHC 34.8 30.0 - 36.0 g/dL   RDW 13.4 11.5 - 15.5 %   Platelets 354 150 - 400 K/uL   Neutrophils Relative % 56 %   Neutro Abs 5.5 1.7 - 7.7 K/uL   Lymphocytes Relative 31 %   Lymphs Abs 3.1 0.7 - 4.0 K/uL   Monocytes Relative 9 %   Monocytes Absolute 0.9 0.1 - 1.0 K/uL   Eosinophils Relative 3 %   Eosinophils Absolute 0.3 0.0 - 0.7 K/uL   Basophils Relative 1 %   Basophils Absolute 0.1 0.0 - 0.1 K/uL  Urine rapid drug screen (hosp performed)     Status: Abnormal   Collection Time: 10/26/16  5:08 AM  Result Value Ref Range   Opiates NONE DETECTED NONE DETECTED   Cocaine NONE DETECTED NONE DETECTED   Benzodiazepines POSITIVE (A) NONE DETECTED   Amphetamines NONE DETECTED NONE DETECTED   Tetrahydrocannabinol POSITIVE (A) NONE DETECTED   Barbiturates NONE DETECTED NONE DETECTED    Comment:        DRUG SCREEN FOR MEDICAL PURPOSES ONLY.  IF CONFIRMATION IS NEEDED FOR ANY PURPOSE, NOTIFY LAB WITHIN 5 DAYS.        LOWEST DETECTABLE LIMITS FOR URINE DRUG SCREEN Drug Class       Cutoff (ng/mL) Amphetamine      1000 Barbiturate      200 Benzodiazepine   469 Tricyclics       629 Opiates          300 Cocaine          300 THC              50     Current Facility-Administered Medications  Medication Dose Route Frequency Provider Last Rate Last Dose  . amLODipine (NORVASC) tablet 5 mg  5 mg Oral QHS Varney Biles, MD   5 mg at 10/26/16 2149  . aspirin EC tablet 81 mg  81 mg Oral Daily Nanavati, Ankit, MD   81 mg at 10/27/16 1034  . busPIRone (BUSPAR) tablet 15 mg  15 mg Oral BID Varney Biles, MD   15 mg at 10/27/16 1034  . hydrOXYzine (ATARAX/VISTARIL) tablet 25 mg  25 mg Oral Q8H PRN Varney Biles, MD   25 mg at 10/26/16 2356  . ibuprofen (ADVIL,MOTRIN) tablet 600 mg  600 mg Oral Q8H PRN Nanavati, Ankit, MD      . lamoTRIgine (LAMICTAL) tablet 25 mg  25 mg Oral BID Kathrynn Humble, Ankit, MD   25 mg at  10/27/16 1034  . levothyroxine (SYNTHROID, LEVOTHROID) tablet 75 mcg  75 mcg Oral QAC breakfast Varney Biles, MD   75 mcg at 10/27/16 0836  . metoprolol tartrate (LOPRESSOR) tablet 50 mg  50 mg Oral BID Varney Biles, MD   50 mg at 10/27/16 1034  . OLANZapine (ZYPREXA) tablet 10 mg  10 mg Oral QHS Sophiah Rolin, MD      . ondansetron (ZOFRAN) tablet 4 mg  4 mg Oral Q8H PRN Nanavati, Ankit, MD      . pantoprazole (PROTONIX) EC tablet 40 mg  40 mg Oral Daily Kathrynn Humble, Ankit, MD   40 mg at 10/27/16 1034   Current Outpatient Prescriptions  Medication Sig Dispense Refill  . amLODipine (NORVASC) 5 MG tablet Take 1 tablet (5 mg total) by mouth at bedtime. 30 tablet 0  . aspirin EC 81 MG tablet Take 81 mg by mouth daily.    Marland Kitchen  busPIRone (BUSPAR) 15 MG tablet Take 15 mg by mouth 2 (two) times daily.    Marland Kitchen esomeprazole (NEXIUM) 40 MG capsule Take 40 mg by mouth daily at 12 noon.    . hydrOXYzine (ATARAX/VISTARIL) 25 MG tablet Take 1 tablet (25 mg total) by mouth every 8 (eight) hours as needed. (Patient taking differently: Take 50 mg by mouth 3 (three) times daily. ) 60 tablet 0  . lamoTRIgine (LAMICTAL) 25 MG tablet Take 25 mg by mouth 2 (two) times daily.    Marland Kitchen levothyroxine (SYNTHROID, LEVOTHROID) 75 MCG tablet Take 75 mcg by mouth daily before breakfast.    . metoprolol (LOPRESSOR) 50 MG tablet Take 1 tablet (50 mg total) by mouth 2 (two) times daily. (Patient taking differently: Take 25 mg by mouth 2 (two) times daily. ) 60 tablet 0  . nitroGLYCERIN (NITROSTAT) 0.4 MG SL tablet Place 0.4 mg under the tongue every 5 (five) hours as needed for chest pain.     Marland Kitchen OLANZapine (ZYPREXA) 7.5 MG tablet Take 7.5 mg by mouth at bedtime.    . Oxycodone HCl 10 MG TABS Take 10 mg by mouth 4 (four) times daily.     . promethazine (PHENERGAN) 25 MG tablet Take 1 tablet (25 mg total) by mouth every 6 (six) hours as needed for nausea or vomiting. 30 tablet 0  . Vitamin D, Ergocalciferol, (DRISDOL) 50000 units  CAPS capsule Take 50,000 Units by mouth every 7 (seven) days.    . multivitamin (RENA-VIT) TABS tablet Take 1 tablet by mouth at bedtime. (Patient not taking: Reported on 10/26/2016) 30 tablet 0  . Nutritional Supplements (FEEDING SUPPLEMENT, NEPRO CARB STEADY,) LIQD Take 237 mLs by mouth 2 (two) times daily between meals. (Patient not taking: Reported on 09/10/2016) 60 Can 0    Musculoskeletal: Strength & Muscle Tone: within normal limits Gait & Station: unsteady Patient leans: N/A  Psychiatric Specialty Exam: Physical Exam  Psychiatric: His speech is normal. Judgment normal. His mood appears anxious. He is actively hallucinating. Cognition and memory are normal. He expresses suicidal ideation.    Review of Systems  Constitutional: Positive for malaise/fatigue.  HENT: Negative.   Eyes: Negative.   Respiratory: Negative.   Cardiovascular: Negative.   Gastrointestinal: Negative.   Genitourinary: Negative.   Musculoskeletal: Positive for back pain and myalgias.  Skin: Negative.   Neurological: Negative.   Endo/Heme/Allergies: Negative.   Psychiatric/Behavioral: Positive for depression, hallucinations, substance abuse and suicidal ideas. The patient is nervous/anxious.     Blood pressure 104/68, pulse 60, temperature 98.1 F (36.7 C), temperature source Oral, resp. rate 16, weight 99.3 kg (219 lb), SpO2 98 %.Body mass index is 29.7 kg/m.  General Appearance: Casual  Eye Contact:  Fair  Speech:  Pressured  Volume:  Decreased  Mood:  Depressed and Dysphoric  Affect:  Constricted  Thought Process:  Coherent  Orientation:  Full (Time, Place, and Person)  Thought Content:  Hallucinations: Auditory Visual  Suicidal Thoughts:  Yes.  without intent/plan  Homicidal Thoughts:  No  Memory:  Immediate;   Fair Recent;   Fair Remote;   Good  Judgement:  Other:  marginal  Insight:  Shallow  Psychomotor Activity:  Psychomotor Retardation  Concentration:  Concentration: Fair and Attention  Span: Fair  Recall:  [place] of Knowledge:  Good  Language:  Good  Akathisia:  No  Handed:  Right  AIMS (if indicated):     Assets:  Communication Skills Desire for Improvement Social Support  ADL's:  Intact  Cognition:  WNL  Sleep:   poor     Treatment Plan Summary: Daily contact with patient to assess and evaluate symptoms and progress in treatment and Medication management  Increase Zyprexa to 10 mg qhs for psychosis/mood Continue Buspar 15 mg bid for anxiety. Lamictal 25 mg bid for mood.  Disposition: Recommend psychiatric Inpatient admission when medically cleared.  Corena Pilgrim, MD 10/27/2016 12:05 PM

## 2016-10-28 ENCOUNTER — Encounter (HOSPITAL_COMMUNITY): Payer: Self-pay | Admitting: Psychiatry

## 2016-10-28 DIAGNOSIS — F39 Unspecified mood [affective] disorder: Secondary | ICD-10-CM

## 2016-10-28 DIAGNOSIS — G4700 Insomnia, unspecified: Secondary | ICD-10-CM

## 2016-10-28 DIAGNOSIS — F17210 Nicotine dependence, cigarettes, uncomplicated: Secondary | ICD-10-CM

## 2016-10-28 DIAGNOSIS — F1210 Cannabis abuse, uncomplicated: Secondary | ICD-10-CM | POA: Clinically undetermined

## 2016-10-28 DIAGNOSIS — G894 Chronic pain syndrome: Secondary | ICD-10-CM | POA: Diagnosis present

## 2016-10-28 DIAGNOSIS — F419 Anxiety disorder, unspecified: Secondary | ICD-10-CM

## 2016-10-28 DIAGNOSIS — F3164 Bipolar disorder, current episode mixed, severe, with psychotic features: Principal | ICD-10-CM

## 2016-10-28 DIAGNOSIS — F29 Unspecified psychosis not due to a substance or known physiological condition: Secondary | ICD-10-CM

## 2016-10-28 MED ORDER — LAMOTRIGINE 25 MG PO TABS
50.0000 mg | ORAL_TABLET | Freq: Every evening | ORAL | Status: DC
  Administered 2016-10-28 – 2016-10-29 (×2): 50 mg via ORAL
  Filled 2016-10-28 (×5): qty 2

## 2016-10-28 MED ORDER — LAMOTRIGINE 25 MG PO TABS
25.0000 mg | ORAL_TABLET | Freq: Every day | ORAL | Status: DC
  Administered 2016-10-29 – 2016-10-30 (×2): 25 mg via ORAL
  Filled 2016-10-28 (×5): qty 1

## 2016-10-28 MED ORDER — NICOTINE POLACRILEX 2 MG MT GUM
CHEWING_GUM | OROMUCOSAL | Status: AC
  Filled 2016-10-28: qty 1

## 2016-10-28 MED ORDER — OXYCODONE HCL 5 MG PO TABS
10.0000 mg | ORAL_TABLET | Freq: Four times a day (QID) | ORAL | Status: DC | PRN
  Administered 2016-10-28 – 2016-10-30 (×8): 10 mg via ORAL
  Filled 2016-10-28 (×8): qty 2

## 2016-10-28 MED ORDER — NICOTINE 14 MG/24HR TD PT24
14.0000 mg | MEDICATED_PATCH | Freq: Every day | TRANSDERMAL | Status: DC
  Administered 2016-10-28 – 2016-10-30 (×3): 14 mg via TRANSDERMAL
  Filled 2016-10-28 (×6): qty 1

## 2016-10-28 MED ORDER — OLANZAPINE 7.5 MG PO TABS
15.0000 mg | ORAL_TABLET | Freq: Every day | ORAL | Status: DC
  Administered 2016-10-28 – 2016-10-29 (×2): 15 mg via ORAL
  Filled 2016-10-28 (×4): qty 2

## 2016-10-28 MED ORDER — AMITRIPTYLINE HCL 10 MG PO TABS
20.0000 mg | ORAL_TABLET | Freq: Every day | ORAL | Status: DC
  Administered 2016-10-28 – 2016-10-29 (×2): 20 mg via ORAL
  Filled 2016-10-28 (×4): qty 2

## 2016-10-28 MED ORDER — NICOTINE POLACRILEX 2 MG MT GUM
2.0000 mg | CHEWING_GUM | OROMUCOSAL | Status: DC | PRN
  Administered 2016-10-28: 2 mg via ORAL

## 2016-10-28 MED ORDER — OLANZAPINE 5 MG PO TABS: 5.0000 mg | ORAL_TABLET | Freq: Every day | ORAL | Status: DC | PRN
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
## 2016-10-28 NOTE — Progress Notes (Signed)
Recreation Therapy Notes  INPATIENT RECREATION THERAPY ASSESSMENT  Patient Details Name: [name] MRN: [id number] DOB: 03/02/1966 Today's Date: 10/28/2016  Patient Stressors: Family, Other (Comment) (Finances, renal failure, health issues)  Pt stated he was here for hearing voices and panic attacks.  Coping Skills:   Isolate, Avoidance, Exercise, Music, Sports  Personal Challenges: Anger, Communication, Decision-Making, Expressing Yourself, Problem-Solving, Relationships, Self-Esteem/Confidence, Social Interaction, Time Management, Trusting Others  Leisure Interests (2+):  Individual - Other (Comment), Music - Listen, Sports - Exercise (Comment) (Watch movies; strength training)  Awareness of Community Resources:  Yes  Community Resources:  [place], Other (Comment) (Walking paths)  Current Use: No  Patient Strengths:  Stand alone, intelligence  Patient Identified Areas of Improvement:  Meeting people, Medications  Current Recreation Participation:  Everyday  Patient Goal for Hospitalization:  "Get over voices/hallucinations, get on regular sleep and medication schedule"  [place] of Residence:  [place] of Residence:  [place]  Current SI (including self-harm):  No  Current HI:  No  Consent to Intern Participation: N/A   Victorino Sparrow, LRT/CTRS  Victorino Sparrow A 10/28/2016, 12:42 PM

## 2016-10-28 NOTE — H&P (Signed)
Psychiatric Admission Assessment Adult  Patient Identification: [name] MRN:  [id number] Date of Evaluation:  10/28/2016 Chief Complaint: Patient states " I am hearing bad voices ."  Principal Diagnosis: Bipolar disorder, curr episode mixed, severe, with psychotic features ([place]) Diagnosis:   Patient Active Problem List   Diagnosis Date Noted  . Chronic pain syndrome [G89.4] 10/28/2016  . Cannabis use disorder, mild, abuse [F12.10] 10/28/2016  . Rhabdomyolysis [M62.82] 08/23/2016  . AKI (acute kidney injury) ([place]) [N17.9] 08/23/2016  . Transaminitis [R74.0] 08/23/2016  . Drug overdose, multiple drugs [T50.901A] 08/23/2016  . Tachycardia [R00.0] 08/23/2016  . Accelerated hypertension [I10] 08/23/2016  . Bipolar disorder, curr episode mixed, severe, with psychotic features ([place]) [F31.64] 12/26/2015  . Cocaine use disorder, mild, abuse [F14.10] 12/26/2015  . History of rhabdomyolysis [Z87.39] 12/26/2015  . Liver injury [S36.119A] 12/26/2015  . Hx of renal failure [Z87.448] 12/26/2015  . Angina, class III ([place]) [I20.9] 08/18/2015  . Dyslipidemia, goal LDL below 70 [E78.5] 08/18/2015  . Family history of premature coronary artery disease [Z82.49] 08/18/2015  . Tobacco use disorder, continuous [F17.209] 08/18/2015  . Tibial plateau fracture [S82.143A] 10/02/2013  . Multiple falls [R29.6] 10/02/2013  . Alteration in self-care ability [R53.81] 10/02/2013  . 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 Fracture of tibia with fibula, left, closed [S82.202A, S82.402A] 10/02/2013  . Chronic back pain [M54.9, G89.29] 10/02/2013  . 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 History of suicidal ideation [Z86.59] 10/02/2013  . Postural imbalance with levoscoliosis, h/o [R29.3] 10/02/2013  . Chest pain [R07.9] 12/25/2012  . IRRITABLE BOWEL SYNDROME [K58.9] 08/09/2008  . HYPERLIPIDEMIA [E78.5] 10/28/2006  . DISORDER, BIPOLAR NOS [F31.9] 10/28/2006  .  ANXIETY [F41.1] 10/28/2006  . DEPRESSION [F32.9] 10/28/2006  . PAIN, CHRONIC NEC [G89.29] 10/28/2006  . GERD [K21.9] 10/28/2006   History of Present Illness: [name] is a 51 y old CM,separated , on SSI , lives in [place], [place] , who has a hx of Bipolar disorder, chronic pain, hx of multiple surgeries ,DDD, fibromyalgia ,presented with worsening mood sx as well as command AH.  Patient seen and chart reviewed.Discussed patient with treatment team. Pt today seen as anxious , labile, depressed, states that he has a lot of financial stressors, separation from wife , social isolation and disability and medical issues that is causing him to be more depressed. He reports that his AH started getting worse and worse to the point that he started hearing voices of his dead brother asking him to hurt people and himself. Pt reports that he also has extreme sleep issues. Has dealt with insomnia for so long , has failed multiple trials of medications like ambien, trazodone , remeron and so on.  Pt reports low energy , lack of motivation as well as times when he feels restless and agitated . Pt reports he believes people are stealing from him since he think medications and other items are missing from his home. Pt reports chronic pain( DDD, injury, fractures ) as a major stressor , is on oxycodone , his wife gives him a days supply at a time . He has a hx of getting confused and overdosing on medications in the past which led to rhabdomyolysis and renal issues. He reports his providers are keeping a close eye on his renal function. Pt reports panic sx, especially with the voices getting worse , which is kind of scary. Pt reports he has an aide that comes to take care of him three days a week and  that has helped him a lot. He denies abusing any substances , however UDS negative for opioids ( which is concerning since he reports taking it 4 times a day) , UDS positive for BZD and THC - he denies abusing BZD , states his  neighbor gave him herbal tea when he was having a panic attack. He also reports he just smoked a quarter of a blunt prior to admission , but normally does no abuse cannabis.    Associated Signs/Symptoms: Depression Symptoms:  depressed mood, anhedonia, insomnia, psychomotor agitation, fatigue, feelings of worthlessness/guilt, difficulty concentrating, hopelessness, anxiety, panic attacks, (Hypo) Manic Symptoms:  Delusions, Distractibility, Impulsivity, Irritable Mood, Anxiety Symptoms:  Excessive Worry, Panic Symptoms, Psychotic Symptoms:  Hallucinations: Auditory Command:  see above Paranoia, PTSD Symptoms: Negative Total Time spent with patient: 45 minutes  Past Psychiatric History: Hx of several IP admissions - x3 at [facility], [place] in [place], currently follows up with Dr.Akintayo. Has had SI in the past , plan to shoot , was admitted at that time.  Is the patient at risk to self? Yes.    Has the patient been a risk to self in the past 6 months? Yes.    Has the patient been a risk to self within the distant past? Yes.    Is the patient a risk to others? Yes.    Has the patient been a risk to others in the past 6 months? Yes.    Has the patient been a risk to others within the distant past? Yes.     Prior Inpatient Therapy:   Prior Outpatient Therapy:    Alcohol Screening: 1. How often do you have a drink containing alcohol?: Never 9. Have you or someone else been injured as a result of your drinking?: No 10. Has a relative or friend or a doctor or another health worker been concerned about your drinking or suggested you cut down?: No Alcohol Use Disorder Identification Test Final Score (AUDIT): 0 Brief Intervention: AUDIT score less than 7 or less-screening does not suggest unhealthy drinking-brief intervention not indicated Substance Abuse History in the last 12 months:  Yes.   Consequences of Substance Abuse: Medical Consequences:  admissions to IP units Previous  Psychotropic Medications: Yes  Psychological Evaluations: Yes  Past Medical History:  Past Medical History:  Diagnosis Date  . Accelerated hypertension 08/23/2016  . Anxiety   . Arthritis   . Bipolar affective disorder ([place])   . Chronic back pain   . Chronic kidney disease    AKF - due to Rhabdomyolysis  . DDD (degenerative disc disease)   . Depression   . DJD (degenerative joint disease)   . Elevated liver function tests   . Fibromyalgia   . GERD (gastroesophageal reflux disease)   . Head injury   . Head injury, closed, with concussion    x 3  . Hyperlipidemia   . Hypothyroidism   . IBS (irritable bowel syndrome)   . Inguinal hernia    left  . Insomnia   . Myofascial pain syndrome   . Osteopenia   . Shortness of breath dyspnea    with exertion  . Tibial plateau fracture, left     Past Surgical History:  Procedure Laterality Date  . CARDIAC CATHETERIZATION N/A 08/31/2015   Procedure: Left Heart Cath and Coronary Angiography;  Surgeon: Leonie Man, MD;  Location: [place] [facility];  Service: Cardiovascular;  Laterality: N/A;  . Carotid Dopplers Bilateral 02/20/2015   [facility]: Mild,  less than 39% left and right internal carotid artery stenosis. No significant plaque burden  . CERVICAL DISC SURGERY     C5-7  . COLONOSCOPY    . COLONOSCOPY    . ESOPHAGOGASTRODUODENOSCOPY    . FASCIOTOMY Left 10/20/2013   Procedure: LEFT leg ANTERIOR COMPARTMENT FACSCIOTOMY;  Surgeon: Rozanna Box, MD;  Location: [place];  Service: Orthopedics;  Laterality: Left;  . INGUINAL HERNIA REPAIR Left   . Nuclear Stress Test  03/06/2015   [facility]: Normal EKG. Low normal EF (49%) normal regional wall motion. No evidence of ischemia or infarction.  . ORIF TIBIA PLATEAU Left 10/20/2013   Procedure: OPEN REDUCTION INTERNAL FIXATION (ORIF) LEFT TIBIAL PLATEAU;  Surgeon: Rozanna Box, MD;  Location: [place];  Service: Orthopedics;  Laterality: Left;  . SEPTOPLASTY     . SPINAL CORD STIMULATOR BATTERY EXCHANGE N/A 02/08/2016   Procedure: Lumbar spinal cord stimulator implantable pulse generator replacement;  Surgeon: Clydell Hakim, MD;  Location: [place] [facility];  Service: Neurosurgery;  Laterality: N/A;  . SPINAL CORD STIMULATOR IMPLANT    . TRANSTHORACIC ECHOCARDIOGRAM  02/20/2015   [facility]: Mild concentric LVH. EF 55-60%. Normal regional wall motion. Mild to moderate TR with no significant pulmonary hypertension.   Family History:  Family History  Problem Relation Age of Onset  . Heart disease Mother   . Hypertension Mother   . Sudden death Mother        Presumably cardiac  . Hyperlipidemia Father   . Heart attack Father 41       At least 5 MIs. Had CABG.  . Heart failure Father   . Diabetes Father   . Hypertension Father   . Kidney disease Father   . Diabetes Brother   . Lung cancer Maternal Grandmother   . Diabetes Paternal Grandmother   . Sudden death Paternal Grandmother        Unclear etiology  . Diabetes Unknown   . Kidney disease Unknown   . Kidney disease Brother   . Hypertension Brother   . Sudden death Brother        Thought to be related to heart disease  . Throat cancer Brother   . Colon cancer Neg Hx   . Mental illness Neg Hx    Family Psychiatric  History: Please see above Tobacco Screening: Have you used any form of tobacco in the last 30 days? (Cigarettes, Smokeless Tobacco, Cigars, and/or Pipes): Yes Tobacco use, Select all that apply: 5 or more cigarettes per day Are you interested in Tobacco Cessation Medications?: Yes, will notify MD for an order Counseled patient on smoking cessation including recognizing danger situations, developing coping skills and basic information about quitting provided: Yes Social History: Pt is separated , lives by self in [place], has a 62 yr old daughter who is at [facility] - has a good relationship with her wife, wife is supportive , gives him his medications on a daily basis ,  especially pain medications , is on SSD, has an aide who helps him at home . History  Alcohol Use No     History  Drug Use  . Types: Oxycodone, Cocaine    Comment: rarely- last 2015-    Additional Social History:                           Allergies:   Allergies  Allergen Reactions  . Zolpidem Tartrate Other (See Comments)  Hallucinations and sleep walks  . Lyrica [Pregabalin]   . Demerol [Meperidine] Itching, Rash and Hives   Lab Results: No results found for this or any previous visit (from the past 48 hour(s)).  Blood Alcohol level:  Lab Results  Component Value Date   [facility] <5 10/26/2016   ETH <11 74/16/3845    Metabolic Disorder Labs:  Lab Results  Component Value Date   HGBA1C 5.2 12/26/2015   MPG 103 12/26/2015   MPG 100 12/26/2012   No results found for: PROLACTIN Lab Results  Component Value Date   CHOL 244 (H) 12/27/2015   TRIG 333 (H) 08/30/2016   HDL 37 (L) 12/27/2015   CHOLHDL 6.6 12/27/2015   VLDL 31 12/27/2015   LDLCALC 176 (H) 12/27/2015   LDLCALC 100 (H) 12/26/2012    Current Medications: Current Facility-Administered Medications  Medication Dose Route Frequency Provider Last Rate Last Dose  . acetaminophen (TYLENOL) tablet 650 mg  650 mg Oral Q6H PRN Patrecia Pour, NP      . alum & mag hydroxide-simeth (MAALOX/MYLANTA) 200-200-20 MG/5ML suspension 30 mL  30 mL Oral Q4H PRN Patrecia Pour, NP      . amitriptyline (ELAVIL) tablet 20 mg  20 mg Oral QHS Byanka Landrus, MD      . amLODipine (NORVASC) tablet 5 mg  5 mg Oral QHS Patrecia Pour, NP   5 mg at 10/27/16 2112  . aspirin EC tablet 81 mg  81 mg Oral Daily Patrecia Pour, NP   81 mg at 10/28/16 3646  . busPIRone (BUSPAR) tablet 15 mg  15 mg Oral BID Patrecia Pour, NP   15 mg at 10/28/16 8032  . hydrOXYzine (ATARAX/VISTARIL) tablet 25 mg  25 mg Oral Q8H PRN Patrecia Pour, NP   25 mg at 10/28/16 0806  . ibuprofen (ADVIL,MOTRIN) tablet 600 mg  600 mg Oral Q8H PRN Patrecia Pour, NP      . Derrill Memo ON 10/29/2016] lamoTRIgine (LAMICTAL) tablet 25 mg  25 mg Oral Daily Crissy Mccreadie, MD      . lamoTRIgine (LAMICTAL) tablet 50 mg  50 mg Oral QPM Earnest Mcgillis, MD      . levothyroxine (SYNTHROID, LEVOTHROID) tablet 75 mcg  75 mcg Oral QAC breakfast Patrecia Pour, NP   75 mcg at 10/28/16 [phone]  . magnesium hydroxide (MILK OF MAGNESIA) suspension 30 mL  30 mL Oral Daily PRN Patrecia Pour, NP      . metoprolol tartrate (LOPRESSOR) tablet 50 mg  50 mg Oral BID Patrecia Pour, NP   50 mg at 10/28/16 0802  . nicotine (NICODERM CQ - dosed in mg/24 hours) patch 14 mg  14 mg Transdermal Daily Si Jachim, MD   14 mg at 10/28/16 1046  . nicotine polacrilex (NICORETTE) gum 2 mg  2 mg Oral PRN Ursula Alert, MD   2 mg at 10/28/16 0144  . OLANZapine (ZYPREXA) tablet 15 mg  15 mg Oral QHS Twisha Vanpelt, MD      . ondansetron (ZOFRAN) tablet 4 mg  4 mg Oral Q8H PRN Patrecia Pour, NP      . oxyCODONE (Oxy IR/ROXICODONE) immediate release tablet 10 mg  10 mg Oral QID PRN Ursula Alert, MD      . pantoprazole (PROTONIX) EC tablet 40 mg  40 mg Oral Daily Patrecia Pour, NP   40 mg at 10/28/16 0802   PTA Medications: Prescriptions Prior to Admission  Medication Sig Dispense  Refill Last Dose  . amLODipine (NORVASC) 5 MG tablet Take 1 tablet (5 mg total) by mouth at bedtime. 30 tablet 0 10/25/2016 at Unknown time  . aspirin EC 81 MG tablet Take 81 mg by mouth daily.   10/25/2016 at Unknown time  . busPIRone (BUSPAR) 15 MG tablet Take 15 mg by mouth 2 (two) times daily.   10/25/2016 at Unknown time  . esomeprazole (NEXIUM) 40 MG capsule Take 40 mg by mouth daily at 12 noon.   10/25/2016 at Unknown time  . hydrOXYzine (ATARAX/VISTARIL) 25 MG tablet Take 1 tablet (25 mg total) by mouth every 8 (eight) hours as needed. (Patient taking differently: Take 50 mg by mouth 3 (three) times daily. ) 60 tablet 0 10/25/2016 at Unknown time  . lamoTRIgine (LAMICTAL) 25 MG tablet Take 25  mg by mouth 2 (two) times daily.   10/25/2016 at Unknown time  . levothyroxine (SYNTHROID, LEVOTHROID) 75 MCG tablet Take 75 mcg by mouth daily before breakfast.   10/25/2016 at Unknown time  . metoprolol (LOPRESSOR) 50 MG tablet Take 1 tablet (50 mg total) by mouth 2 (two) times daily. (Patient taking differently: Take 25 mg by mouth 2 (two) times daily. ) 60 tablet 0 10/25/2016 at 1800  . multivitamin (RENA-VIT) TABS tablet Take 1 tablet by mouth at bedtime. (Patient not taking: Reported on 10/26/2016) 30 tablet 0 Not Taking at Unknown time  . nitroGLYCERIN (NITROSTAT) 0.4 MG SL tablet Place 0.4 mg under the tongue every 5 (five) hours as needed for chest pain.    unk  . Nutritional Supplements (FEEDING SUPPLEMENT, NEPRO CARB STEADY,) LIQD Take 237 mLs by mouth 2 (two) times daily between meals. (Patient not taking: Reported on 09/10/2016) 60 Can 0 Not Taking at Unknown time  . OLANZapine (ZYPREXA) 7.5 MG tablet Take 7.5 mg by mouth at bedtime.   10/25/2016  . Oxycodone HCl 10 MG TABS Take 10 mg by mouth 4 (four) times daily.    10/25/2016 at Unknown time  . promethazine (PHENERGAN) 25 MG tablet Take 1 tablet (25 mg total) by mouth every 6 (six) hours as needed for nausea or vomiting. 30 tablet 0 unk  . Vitamin D, Ergocalciferol, (DRISDOL) 50000 units CAPS capsule Take 50,000 Units by mouth every 7 (seven) days.   10/23/2016    Musculoskeletal: Strength & Muscle Tone: within normal limits Gait & Station: uses walker Patient leans: N/A  Psychiatric Specialty Exam: Physical Exam  Review of Systems  Musculoskeletal: Positive for back pain.  Psychiatric/Behavioral: Positive for depression, hallucinations and substance abuse. The patient is nervous/anxious and has insomnia.   All other systems reviewed and are negative.   Blood pressure 101/67, pulse 86, temperature 97.7 F (36.5 C), temperature source Oral, resp. rate 16, height 6' (1.829 m), weight 99.8 kg (220 lb), SpO2 100 %.Body mass index is  29.84 kg/m.  General Appearance: Guarded  Eye Contact:  Fair  Speech:  Normal Rate  Volume:  Decreased  Mood:  Anxious, Depressed and Dysphoric  Affect:  Labile  Thought Process:  Goal Directed and Descriptions of Associations: Intact  Orientation:  Full (Time, Place, and Person)  Thought Content:  Delusions, Hallucinations: Auditory Command:  kill self and kill others, Paranoid Ideation and Rumination  Suicidal Thoughts:  No  Homicidal Thoughts:  No  Memory:  Immediate;   Fair Recent;   Fair Remote;   Fair  Judgement:  Impaired  Insight:  Shallow  Psychomotor Activity:  Restlessness  Concentration:  Concentration: Poor and  Attention Span: Poor  Recall:  [employer] of Knowledge:  Fair  Language:  Fair  Akathisia:  No  Handed:  Right  AIMS (if indicated):     Assets:  Communication Skills Desire for Improvement Social Support  ADL's:  Intact  Cognition:  WNL  Sleep:  Number of Hours: 3.75    Treatment Plan Summary:[name] is a 40 y old CM with hx of Bipolar do, chronic pain , on multiple medications , presents today with worsening psychosis. [name] will need medications and continued treatment. Daily contact with patient to assess and evaluate symptoms and progress in treatment, Medication management and Plan see below   Increase Zyprexa to 15 mg PO qhs for psychosis. Increase Lamictal to 25 mg and 50 mg PO in divided doses for mood lability. Will continue Buspar 15 mg PO bid for anxiety sx. Will continue oxycodone 10 mg PO QID PRN for pain sx. Reviewed [place] controlled substance database. Will add Elavil 20 mg PO qhs for insomnia. Restart home medications where indicated. Reviewed labs - creatinine chronically elevated, however is down trending, hb- low - chronic , uds -positive for bzd, thc . Will get tsh, lipid panel, hba1c, pl. EKG reviewed today - qtc wnl. Provided substance abuse counseling.     Observation Level/Precautions:  Fall 15 minute checks     Psychotherapy:  Individual and group therapy     Consultations:  CSW  Discharge Concerns:  Stability and safety       Physician Treatment Plan for Primary Diagnosis: Bipolar disorder, curr episode mixed, severe, with psychotic features ([place]) Long Term Goal(s): Improvement in symptoms so as ready for discharge  Short Term Goals: Ability to verbalize feelings will improve, Ability to identify and develop effective coping behaviors will improve and Compliance with prescribed medications will improve  Physician Treatment Plan for Secondary Diagnosis: Principal Problem:   Bipolar disorder, curr episode mixed, severe, with psychotic features ([place]) Active Problems:   Tobacco use disorder, continuous   Chronic pain syndrome   Cannabis use disorder, mild, abuse  Long Term Goal(s): Improvement in symptoms so as ready for discharge  Short Term Goals: Ability to verbalize feelings will improve, Ability to identify and develop effective coping behaviors will improve and Compliance with prescribed medications will improve  I certify that inpatient services furnished can reasonably be expected to improve the patient's condition.    Solash Tullo, MD 5/29/20182:24 PM

## 2016-10-28 NOTE — Progress Notes (Signed)
Recreation Therapy Notes  Date: 10/28/16 Time: 1000 Location: 500 Hall Dayroom  Group Topic: Coping Skills  Goal Area(s) Addresses:  Patients will be able to identify positive coping skills. Patients will be able to identify benefits of using coping skills post d/c.  Behavioral Response:  Minimal  Intervention: Mindmap, pencils  Activity: Mindmap.  Patients were given a blank copy of a mindmap.  LRT and patients filled in the first eight boxes together with various situations that require coping skills (bills, loss, anger, medication, family, surgery, relapse and stress.  Patients were to then identify coping skills that can be used to deal with these situations.  LRT would then fill in the coping skills on the board so patients could fill in the blanks on their sheets.  Education: Radiographer, therapeutic, Dentist.   Education Outcome: Acknowledges understanding/In group clarification offered/Needs additional education.   Clinical Observations/Feedback  Pt was quiet and observant for most of the group.  Pt identified staying away from the wrong people as a coping skill for relapse.   Victorino Sparrow, LRT/CTRS         Ria Comment, Ennis Delpozo A 10/28/2016 11:59 AM

## 2016-10-28 NOTE — Progress Notes (Signed)
Patient ID: [name], male   DOB: [birth year], 51 y.o.   MRN: [id number]  Pt currently presents with an appropriate affect and cooperative behavior. Pt reports to writer that their goal is to "get my medications straight." Pt states "I think I did that today with the doctor." Reports that his pain has been tolerable tonight, reports that maintaining his pain at a 4/10 level is his goal. Pt reports good sleep with current medication regimen. Limited interaction with peers, interactions pleasant.  Pt provided with medications per providers orders. Pt's labs and vitals were monitored throughout the night. Pt given a 1:1 about emotional and mental status. Pt supported and encouraged to express concerns and questions. Pt educated on medications and alternative pain management techniques.   Pt's safety ensured with 15 minute and environmental checks. Pt currently denies SI/HI and A/V hallucinations. Pt verbally agrees to seek staff if SI/HI or A/VH occurs and to consult with staff before acting on any harmful thoughts. Will continue POC.

## 2016-10-28 NOTE — BHH Group Notes (Signed)
[place] [facility] LCSW Group Therapy  10/28/2016 2:26 PM   Type of Therapy:  Group Therapy  Participation Level:  Active  Participation Quality:  Attentive  Affect:  Appropriate  Cognitive:  Appropriate  Insight:  Improving  Engagement in Therapy:  Engaged  Modes of Intervention:  Clarification, Education, Exploration and Socialization  Summary of Progress/Problems: Today's group focused on resilience.  Stayed the entire time, engaged throughout.  I was in renal failure and had to stay in the hospital for 17 days.  That was really hard.  My wife found me and she's the one who got me to the hospital.  I would not have made it otherwise.  We are separated.  I guess what got me through is I have a will to live.  I'm not ready to die yet.  Despite problems, life still beats the alternative" Roque Lias B 10/28/2016 , 2:26 PM

## 2016-10-28 NOTE — BHH Suicide Risk Assessment (Signed)
[place] INPATIENT:  Family/Significant Other Suicide Prevention Education  Suicide Prevention Education:  Education Completed; Jaymason Ledesma, Pt's wife [phone],  (name of family member/significant other) has been identified by the patient as the family member/significant other with whom the patient will be residing, and identified as the person(s) who will aid the patient in the event of a mental health crisis (suicidal ideations/suicide attempt).  With written consent from the patient, the family member/significant other has been provided the following suicide prevention education, prior to the and/or following the discharge of the patient.  The suicide prevention education provided includes the following:  Suicide risk factors  Suicide prevention and interventions  National Suicide Hotline telephone number  [facility] assessment telephone number  [facility] Emergency Assistance [place] and/or Residential Mobile Crisis Unit telephone number  Request made of family/significant other to:  Remove weapons (e.g., guns, rifles, knives), all items previously/currently identified as safety concern.    Remove drugs/medications (over-the-counter, prescriptions, illicit drugs), all items previously/currently identified as a safety concern.  The family member/significant other verbalizes understanding of the suicide prevention education information provided.  The family member/significant other agrees to remove the items of safety concern listed above.  Pt is separated from wife (for 61yrs) but wife is still supported. She reports that PT takes oxycodone 10mg  qid. Pt will return home; wife does endorse poor decision making when Pt is trying to form relationships/friendships.   [place] Lighter 10/28/2016, 12:13 PM

## 2016-10-28 NOTE — Progress Notes (Signed)
D: [place] has been calm and appropriate on the unit today. He denied SI and HI, but he endorses command AH as well as VH. He's polite and cooperative. He rated his depression 5/10, hopelessness 4/10, and anxiety 7/10. He was relieved to speak with the MD and get his medications straight.   A: Meds given as ordered. Q15 safety checks maintained. Support/encouragement offered.  R: Pt remains free from harm and continues with treatment. Will continue to monitor for needs/safety.

## 2016-10-28 NOTE — Progress Notes (Signed)
Recreation Therapy Notes  Animal-Assisted Activity (AAA) Program Checklist/Progress Notes Patient Eligibility Criteria Checklist & Daily Group note for Rec TxIntervention  Date: 10/28/2016 Time: 2:55pm Location: [address]    AAA/T Program Assumption of Risk Form signed by Patient/ or Parent Legal Guardian Yes  Patient is free of allergies or sever asthma Yes  Patient reports no fear of animals Yes  Patient reports no history of cruelty to animals Yes  Patient understands his/her participation is voluntary Yes  Patient washes hands before animal contact Yes  Patient washes hands after animal contact Yes  Behavioral Response: Appropriate    Education:Hand Washing, Appropriate Animal Interaction   Education Outcome: Acknowledges education.   Clinical Observations/Feedback: Patient discussed with MD for appropriateness in pet therapy session. Both LRT and MD agree patient is appropriate for participation. Patient offered participation in session and signed necessary consent form without issue. Patient attended and participated appropriately in pet therapy session, petting therapy dog appropriately and interacting with peers appropriately.     Donovan Kail, Recreational Therapy Intern         Donovan Kail 10/28/2016 3:48 PM

## 2016-10-28 NOTE — Tx Team (Signed)
Interdisciplinary Treatment and Diagnostic Plan Update  10/28/2016 Time of Session: 9:30am [name] MRN: [id number]  Principal Diagnosis: Bipolar disorder, curr episode mixed, severe, with psychotic features ([place])  Secondary Diagnoses: Active Problems:   Bipolar affective disorder, depressed, severe (HCC)   Current Medications:  Current Facility-Administered Medications  Medication Dose Route Frequency Provider Last Rate Last Dose  . acetaminophen (TYLENOL) tablet 650 mg  650 mg Oral Q6H PRN Patrecia Pour, NP      . alum & mag hydroxide-simeth (MAALOX/MYLANTA) 200-200-20 MG/5ML suspension 30 mL  30 mL Oral Q4H PRN Patrecia Pour, NP      . amLODipine (NORVASC) tablet 5 mg  5 mg Oral QHS Patrecia Pour, NP   5 mg at 10/27/16 2112  . aspirin EC tablet 81 mg  81 mg Oral Daily Patrecia Pour, NP   81 mg at 10/28/16 8841  . busPIRone (BUSPAR) tablet 15 mg  15 mg Oral BID Patrecia Pour, NP   15 mg at 10/28/16 6606  . hydrOXYzine (ATARAX/VISTARIL) tablet 25 mg  25 mg Oral Q8H PRN Patrecia Pour, NP   25 mg at 10/28/16 0806  . ibuprofen (ADVIL,MOTRIN) tablet 600 mg  600 mg Oral Q8H PRN Patrecia Pour, NP      . lamoTRIgine (LAMICTAL) tablet 25 mg  25 mg Oral BID Patrecia Pour, NP   25 mg at 10/28/16 3016  . levothyroxine (SYNTHROID, LEVOTHROID) tablet 75 mcg  75 mcg Oral QAC breakfast Patrecia Pour, NP   75 mcg at 10/28/16 [phone]  . magnesium hydroxide (MILK OF MAGNESIA) suspension 30 mL  30 mL Oral Daily PRN Patrecia Pour, NP      . metoprolol tartrate (LOPRESSOR) tablet 50 mg  50 mg Oral BID Patrecia Pour, NP   50 mg at 10/28/16 0802  . nicotine (NICODERM CQ - dosed in mg/24 hours) patch 14 mg  14 mg Transdermal Daily Eappen, Saramma, MD      . nicotine polacrilex (NICORETTE) gum 2 mg  2 mg Oral PRN Ursula Alert, MD   2 mg at 10/28/16 0144  . OLANZapine (ZYPREXA) tablet 10 mg  10 mg Oral QHS Patrecia Pour, NP   10 mg at 10/27/16 2047  . ondansetron (ZOFRAN) tablet 4 mg   4 mg Oral Q8H PRN Patrecia Pour, NP      . oxyCODONE (Oxy IR/ROXICODONE) immediate release tablet 10 mg  10 mg Oral TID PRN Patrecia Pour, NP   10 mg at 10/28/16 [phone]  . pantoprazole (PROTONIX) EC tablet 40 mg  40 mg Oral Daily Patrecia Pour, NP   40 mg at 10/28/16 0802    PTA Medications: Prescriptions Prior to Admission  Medication Sig Dispense Refill Last Dose  . amLODipine (NORVASC) 5 MG tablet Take 1 tablet (5 mg total) by mouth at bedtime. 30 tablet 0 10/25/2016 at Unknown time  . aspirin EC 81 MG tablet Take 81 mg by mouth daily.   10/25/2016 at Unknown time  . busPIRone (BUSPAR) 15 MG tablet Take 15 mg by mouth 2 (two) times daily.   10/25/2016 at Unknown time  . esomeprazole (NEXIUM) 40 MG capsule Take 40 mg by mouth daily at 12 noon.   10/25/2016 at Unknown time  . hydrOXYzine (ATARAX/VISTARIL) 25 MG tablet Take 1 tablet (25 mg total) by mouth every 8 (eight) hours as needed. (Patient taking differently: Take 50 mg by mouth 3 (three) times daily. ) 60  tablet 0 10/25/2016 at Unknown time  . lamoTRIgine (LAMICTAL) 25 MG tablet Take 25 mg by mouth 2 (two) times daily.   10/25/2016 at Unknown time  . levothyroxine (SYNTHROID, LEVOTHROID) 75 MCG tablet Take 75 mcg by mouth daily before breakfast.   10/25/2016 at Unknown time  . metoprolol (LOPRESSOR) 50 MG tablet Take 1 tablet (50 mg total) by mouth 2 (two) times daily. (Patient taking differently: Take 25 mg by mouth 2 (two) times daily. ) 60 tablet 0 10/25/2016 at 1800  . multivitamin (RENA-VIT) TABS tablet Take 1 tablet by mouth at bedtime. (Patient not taking: Reported on 10/26/2016) 30 tablet 0 Not Taking at Unknown time  . nitroGLYCERIN (NITROSTAT) 0.4 MG SL tablet Place 0.4 mg under the tongue every 5 (five) hours as needed for chest pain.    unk  . Nutritional Supplements (FEEDING SUPPLEMENT, NEPRO CARB STEADY,) LIQD Take 237 mLs by mouth 2 (two) times daily between meals. (Patient not taking: Reported on 09/10/2016) 60 Can 0 Not Taking  at Unknown time  . OLANZapine (ZYPREXA) 7.5 MG tablet Take 7.5 mg by mouth at bedtime.   10/25/2016  . Oxycodone HCl 10 MG TABS Take 10 mg by mouth 4 (four) times daily.    10/25/2016 at Unknown time  . promethazine (PHENERGAN) 25 MG tablet Take 1 tablet (25 mg total) by mouth every 6 (six) hours as needed for nausea or vomiting. 30 tablet 0 unk  . Vitamin D, Ergocalciferol, (DRISDOL) 50000 units CAPS capsule Take 50,000 Units by mouth every 7 (seven) days.   10/23/2016    Treatment Modalities: Medication Management, Group therapy, Case management,  1 to 1 session with clinician, Psychoeducation, Recreational therapy.  Patient Stressors: Health problems Medication change or noncompliance  Patient Strengths: Ability for insight Average or above average intelligence Capable of independent living General fund of knowledge Motivation for treatment/growth  Physician Treatment Plan for Primary Diagnosis: Bipolar disorder, curr episode mixed, severe, with psychotic features ([place]) Long Term Goal(s): Improvement in symptoms so as ready for discharge  Short Term Goals: Ability to verbalize feelings will improve Ability to identify and develop effective coping behaviors will improve Compliance with prescribed medications will improve Ability to verbalize feelings will improve Ability to identify and develop effective coping behaviors will improve Compliance with prescribed medications will improve  Medication Management: Evaluate patient's response, side effects, and tolerance of medication regimen.  Therapeutic Interventions: 1 to 1 sessions, Unit Group sessions and Medication administration.  Evaluation of Outcomes: Not Met  Physician Treatment Plan for Secondary Diagnosis: Active Problems:   Bipolar affective disorder, depressed, severe ([place])   Long Term Goal(s): Improvement in symptoms so as ready for discharge  Short Term Goals: Ability to verbalize feelings will improve Ability to  identify and develop effective coping behaviors will improve Compliance with prescribed medications will improve Ability to verbalize feelings will improve Ability to identify and develop effective coping behaviors will improve Compliance with prescribed medications will improve  Medication Management: Evaluate patient's response, side effects, and tolerance of medication regimen.  Therapeutic Interventions: 1 to 1 sessions, Unit Group sessions and Medication administration.  Evaluation of Outcomes: Not Met   RN Treatment Plan for Primary Diagnosis: Bipolar disorder, curr episode mixed, severe, with psychotic features ([place]) Long Term Goal(s): Knowledge of disease and therapeutic regimen to maintain health will improve  Short Term Goals: Ability to verbalize feelings will improve, Ability to disclose and discuss suicidal ideas and Ability to identify and develop effective coping behaviors will improve  Medication Management: RN will administer medications as ordered by provider, will assess and evaluate patient's response and provide education to patient for prescribed medication. RN will report any adverse and/or side effects to prescribing provider.  Therapeutic Interventions: 1 on 1 counseling sessions, Psychoeducation, Medication administration, Evaluate responses to treatment, Monitor vital signs and CBGs as ordered, Perform/monitor CIWA, COWS, AIMS and Fall Risk screenings as ordered, Perform wound care treatments as ordered.  Evaluation of Outcomes: Not Met    Recreational Therapy Treatment Plan for Primary Diagnosis: Bipolar disorder, curr episode mixed, severe, with psychotic features ([place]) Long Term Goal(s): Patient will participate in recreation therapy treatment in at least 2 group sessions without prompting from LRT  Short Term Goals: Patient will be able to identify at least 5 coping skills for admitting diagnosis by conclusion of recreation therapy treatment  Treatment  Modalities: Group and Pet Therapy  Therapeutic Interventions: Psychoeducation  Evaluation of Outcomes: Progressing   LCSW Treatment Plan for Primary Diagnosis: Bipolar disorder, curr episode mixed, severe, with psychotic features ([place]) Long Term Goal(s): Safe transition to appropriate next level of care at discharge, Engage patient in therapeutic group addressing interpersonal concerns.  Short Term Goals: Engage patient in aftercare planning with referrals and resources, Identify triggers associated with mental health/substance abuse issues and Increase skills for wellness and recovery  Therapeutic Interventions: Assess for all discharge needs, 1 to 1 time with Social worker, Explore available resources and support systems, Assess for adequacy in community support network, Educate family and significant other(s) on suicide prevention, Complete Psychosocial Assessment, Interpersonal group therapy.  Evaluation of Outcomes: Not Met   Progress in Treatment: Attending groups: Pt is new to milieu, continuing to assess  Participating in groups: Pt is new to milieu, continuing to assess  Taking medication as prescribed: Yes, MD continues to assess for medication changes as needed Toleration medication: Yes, no side effects reported at this time Family/Significant other contact made: No, CSW assessing for appropriate contact Patient understands diagnosis: Continuing to assess Discussing patient identified problems/goals with staff: Yes Medical problems stabilized or resolved: Yes Denies suicidal/homicidal ideation: Yes Issues/concerns per patient self-inventory: None Other: N/A  New problem(s) identified: None identified at this time.   New Short Term/Long Term Goal(s): None identified at this time.   Discharge Plan or Barriers: Pt will return home and follow-up with outpatient services at [facility]  Reason for Continuation of  Hospitalization: Anxiety Depression Hallucinations Medication stabilization  Estimated Length of Stay: 3-5 days  Attendees: Patient: 10/28/2016  8:51 AM  Physician: Dr. Shea Evans 10/28/2016  8:51 AM  Nursing: Shella Maxim RN 10/28/2016  8:51 AM  RN Care Manager: Lars Pinks, RN 10/28/2016  8:51 AM  Social Worker: Ripley Fraise, [place] 10/28/2016  8:51 AM  Recreational Therapist:  Victorino Sparrow, LRT/CTRS 10/28/2016  8:51 AM  Other: Lindell Spar, NP; Samuel Jester, NP 10/28/2016  8:51 AM  Other:  10/28/2016  8:51 AM  Other: 10/28/2016  8:51 AM    Scribe for Treatment Team: Gladstone Lighter, LCSW 10/28/2016 8:51 AM

## 2016-10-28 NOTE — Progress Notes (Signed)
Adult Psychoeducational Group Note  Date:  10/28/2016 Time:  8:49 PM  Group Topic/Focus:  Wrap-Up Group:   The focus of this group is to help patients review their daily goal of treatment and discuss progress on daily workbooks.  Participation Level:  Active  Participation Quality:  Appropriate  Affect:  Appropriate  Cognitive:  Alert  Insight: Appropriate  Engagement in Group:  Engaged  Modes of Intervention:  Discussion  Additional Comments:  Patient stated he has been antisocial today. Patient's goal for today was to get his medication straighten out. Patient met goal.   Britnee Mcdevitt L Naviah Belfield 10/28/2016, 8:49 PM

## 2016-10-28 NOTE — [facility] Suicide Risk Assessment (Signed)
[facility] Admission Suicide Risk Assessment   Nursing information obtained from:    Demographic factors:    Current Mental Status:    Loss Factors:    Historical Factors:    Risk Reduction Factors:     Total Time spent with patient: 30 minutes Principal Problem: Bipolar disorder, curr episode mixed, severe, with psychotic features ([place]) Diagnosis:   Patient Active Problem List   Diagnosis Date Noted  . Chronic pain syndrome [G89.4] 10/28/2016  . Cannabis use disorder, mild, abuse [F12.10] 10/28/2016  . Rhabdomyolysis [M62.82] 08/23/2016  . AKI (acute kidney injury) ([place]) [N17.9] 08/23/2016  . Transaminitis [R74.0] 08/23/2016  . Drug overdose, multiple drugs [T50.901A] 08/23/2016  . Tachycardia [R00.0] 08/23/2016  . Accelerated hypertension [I10] 08/23/2016  . Bipolar disorder, curr episode mixed, severe, with psychotic features ([place]) [F31.64] 12/26/2015  . Cocaine use disorder, mild, abuse [F14.10] 12/26/2015  . History of rhabdomyolysis [Z87.39] 12/26/2015  . Liver injury [S36.119A] 12/26/2015  . Hx of renal failure [Z87.448] 12/26/2015  . Angina, class III ([place]) [I20.9] 08/18/2015  . Dyslipidemia, goal LDL below 70 [E78.5] 08/18/2015  . Family history of premature coronary artery disease [Z82.49] 08/18/2015  . Tobacco use disorder, continuous [F17.209] 08/18/2015  . Tibial plateau fracture [S82.143A] 10/02/2013  . Multiple falls [R29.6] 10/02/2013  . Alteration in self-care ability [R53.81] 10/02/2013  . 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 Fracture of tibia with fibula, left, closed [S82.202A, S82.402A] 10/02/2013  . Chronic back pain [M54.9, G89.29] 10/02/2013  . 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 History of suicidal ideation [Z86.59] 10/02/2013  . Postural imbalance with levoscoliosis, h/o [R29.3] 10/02/2013  . Chest pain [R07.9] 12/25/2012  . IRRITABLE BOWEL SYNDROME [K58.9] 08/09/2008  . HYPERLIPIDEMIA [E78.5] 10/28/2006   . DISORDER, BIPOLAR NOS [F31.9] 10/28/2006  . ANXIETY [F41.1] 10/28/2006  . DEPRESSION [F32.9] 10/28/2006  . PAIN, CHRONIC NEC [G89.29] 10/28/2006  . GERD [K21.9] 10/28/2006   Subjective Data: Please see H&P.   Continued Clinical Symptoms:  Alcohol Use Disorder Identification Test Final Score (AUDIT): 0 The "Alcohol Use Disorders Identification Test", Guidelines for Use in Primary Care, Second Edition.  World Pharmacologist [facility]). Score between 0-7:  no or low risk or alcohol related problems. Score between 8-15:  moderate risk of alcohol related problems. Score between 16-19:  high risk of alcohol related problems. Score 20 or above:  warrants further diagnostic evaluation for alcohol dependence and treatment.   CLINICAL FACTORS:   Alcohol/Substance Abuse/Dependencies Chronic Pain Currently Psychotic Previous Psychiatric Diagnoses and Treatments Medical Diagnoses and Treatments/Surgeries   Musculoskeletal: Strength & Muscle Tone: within normal limits Gait & Station: normal Patient leans: N/A  Psychiatric Specialty Exam: Physical Exam  ROS  Blood pressure 101/67, pulse 86, temperature 97.7 F (36.5 C), temperature source Oral, resp. rate 16, height 6' (1.829 m), weight 99.8 kg (220 lb), SpO2 100 %.Body mass index is 29.84 kg/m.                Please see H&P.                                           COGNITIVE FEATURES THAT CONTRIBUTE TO RISK:  Closed-mindedness, Polarized thinking and Thought constriction (tunnel vision)    SUICIDE RISK:   Moderate:  Frequent suicidal ideation with limited intensity, and duration, some specificity in terms of plans, no associated intent, good self-control, limited  dysphoria/symptomatology, some risk factors present, and identifiable protective factors, including available and accessible social support.  PLAN OF CARE: Please see H&P.   I certify that inpatient services furnished can reasonably be  expected to improve the patient's condition.   Rhen Kawecki, MD 10/28/2016, 1:55 PM

## 2016-10-28 NOTE — Progress Notes (Signed)
D: Pt is flat isolative and withdrawn to self even while in the dayroom. Pt at the time of assessment endorsed moderate lower to mid back pain, depression, anxiety and AVH; "I can't see through them, I can't hear them like I was at home. Pt denied SI or HI. Pt made minimal interactions. Preoccupied about his pain meds. A: Medications offered as prescribed.  All patient's questions and concerns addressed. Support, encouragement, and safe environment provided. 15-minute safety checks continue. R: Pt was med compliant. Pt did attend wrap-up group. Safety checks continue.

## 2016-10-29 LAB — LIPID PANEL
CHOLESTEROL: 245 mg/dL — AB (ref 0–200)
HDL: 47 mg/dL (ref >40)
LDL Cholesterol: 159 mg/dL — ABNORMAL HIGH (ref 0–99)
Total CHOL/HDL Ratio: 5.2 RATIO
Triglycerides: 195 mg/dL — ABNORMAL HIGH (ref <150)
VLDL: 39 mg/dL (ref 0–40)

## 2016-10-29 LAB — TSH: TSH: 4.218 u[IU]/mL (ref 0.350–4.500)

## 2016-10-29 NOTE — Progress Notes (Signed)
[facility] MD Progress Note  10/29/2016 2:43 PM [name]  MRN:  [id number] Subjective:  Patient states " I did not sleep last night . I was in pain.'  Objective:Patient seen and chart reviewed.Discussed patient with treatment team.  Pt today seen as depressed and anxious , but improving. Pt reports some sleep issues , but states he is OK since he just have to take his pain medications which is PRN. Pt denies new concerns, reports elavil as working OK - denies ADRs. Per RN , continues to need encouragement and support.     Principal Problem: Bipolar disorder, curr episode mixed, severe, with psychotic features ([place]) Diagnosis:   Patient Active Problem List   Diagnosis Date Noted  . Chronic pain syndrome [G89.4] 10/28/2016  . Cannabis use disorder, mild, abuse [F12.10] 10/28/2016  . Rhabdomyolysis [M62.82] 08/23/2016  . AKI (acute kidney injury) ([place]) [N17.9] 08/23/2016  . Transaminitis [R74.0] 08/23/2016  . Drug overdose, multiple drugs [T50.901A] 08/23/2016  . Tachycardia [R00.0] 08/23/2016  . Accelerated hypertension [I10] 08/23/2016  . Bipolar disorder, curr episode mixed, severe, with psychotic features ([place]) [F31.64] 12/26/2015  . Cocaine use disorder, mild, abuse [F14.10] 12/26/2015  . History of rhabdomyolysis [Z87.39] 12/26/2015  . Liver injury [S36.119A] 12/26/2015  . Hx of renal failure [Z87.448] 12/26/2015  . Angina, class III ([place]) [I20.9] 08/18/2015  . Dyslipidemia, goal LDL below 70 [E78.5] 08/18/2015  . Family history of premature coronary artery disease [Z82.49] 08/18/2015  . Tobacco use disorder, continuous [F17.209] 08/18/2015  . Tibial plateau fracture [S82.143A] 10/02/2013  . Multiple falls [R29.6] 10/02/2013  . Alteration in self-care ability [R53.81] 10/02/2013  . 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 Fracture of tibia with fibula, left, closed [S82.202A, S82.402A] 10/02/2013  . Chronic back pain [M54.9, G89.29] 10/02/2013  .  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 History of suicidal ideation [Z86.59] 10/02/2013  . Postural imbalance with levoscoliosis, h/o [R29.3] 10/02/2013  . Chest pain [R07.9] 12/25/2012  . IRRITABLE BOWEL SYNDROME [K58.9] 08/09/2008  . HYPERLIPIDEMIA [E78.5] 10/28/2006  . DISORDER, BIPOLAR NOS [F31.9] 10/28/2006  . ANXIETY [F41.1] 10/28/2006  . DEPRESSION [F32.9] 10/28/2006  . PAIN, CHRONIC NEC [G89.29] 10/28/2006  . GERD [K21.9] 10/28/2006   Total Time spent with patient: 25 minutes  Past Psychiatric History: Please see H&P.   Past Medical History:  Past Medical History:  Diagnosis Date  . Accelerated hypertension 08/23/2016  . Anxiety   . Arthritis   . Bipolar affective disorder ([place])   . Chronic back pain   . Chronic kidney disease    AKF - due to Rhabdomyolysis  . DDD (degenerative disc disease)   . Depression   . DJD (degenerative joint disease)   . Elevated liver function tests   . Fibromyalgia   . GERD (gastroesophageal reflux disease)   . Head injury   . Head injury, closed, with concussion    x 3  . Hyperlipidemia   . Hypothyroidism   . IBS (irritable bowel syndrome)   . Inguinal hernia    left  . Insomnia   . Myofascial pain syndrome   . Osteopenia   . Shortness of breath dyspnea    with exertion  . Tibial plateau fracture, left     Past Surgical History:  Procedure Laterality Date  . CARDIAC CATHETERIZATION N/A 08/31/2015   Procedure: Left Heart Cath and Coronary Angiography;  Surgeon: Leonie Man, MD;  Location: [facility];  Service: Cardiovascular;  Laterality: N/A;  .  Carotid Dopplers Bilateral 02/20/2015   [facility]: Mild, less than 39% left and right internal carotid artery stenosis. No significant plaque burden  . CERVICAL DISC SURGERY     C5-7  . COLONOSCOPY    . COLONOSCOPY    . ESOPHAGOGASTRODUODENOSCOPY    . FASCIOTOMY Left 10/20/2013   Procedure: LEFT leg ANTERIOR COMPARTMENT FACSCIOTOMY;   Surgeon: Rozanna Box, MD;  Location: [facility];  Service: Orthopedics;  Laterality: Left;  . INGUINAL HERNIA REPAIR Left   . Nuclear Stress Test  03/06/2015   [facility]: Normal EKG. Low normal EF (49%) normal regional wall motion. No evidence of ischemia or infarction.  . ORIF TIBIA PLATEAU Left 10/20/2013   Procedure: OPEN REDUCTION INTERNAL FIXATION (ORIF) LEFT TIBIAL PLATEAU;  Surgeon: Rozanna Box, MD;  Location: [facility];  Service: Orthopedics;  Laterality: Left;  . SEPTOPLASTY    . SPINAL CORD STIMULATOR BATTERY EXCHANGE N/A 02/08/2016   Procedure: Lumbar spinal cord stimulator implantable pulse generator replacement;  Surgeon: Clydell Hakim, MD;  Location: [facility];  Service: Neurosurgery;  Laterality: N/A;  . SPINAL CORD STIMULATOR IMPLANT    . TRANSTHORACIC ECHOCARDIOGRAM  02/20/2015   [facility]: Mild concentric LVH. EF 55-60%. Normal regional wall motion. Mild to moderate TR with no significant pulmonary hypertension.   Family History:  Family History  Problem Relation Age of Onset  . Heart disease Mother   . Hypertension Mother   . Sudden death Mother        Presumably cardiac  . Hyperlipidemia Father   . Heart attack Father 28       At least 5 MIs. Had CABG.  . Heart failure Father   . Diabetes Father   . Hypertension Father   . Kidney disease Father   . Diabetes Brother   . Lung cancer Maternal Grandmother   . Diabetes Paternal Grandmother   . Sudden death Paternal Grandmother        Unclear etiology  . Diabetes Unknown   . Kidney disease Unknown   . Kidney disease Brother   . Hypertension Brother   . Sudden death Brother        Thought to be related to heart disease  . Throat cancer Brother   . Colon cancer Neg Hx   . Mental illness Neg Hx    Family Psychiatric  History: Please see H&P.  Social History: Please see H&P.  History  Alcohol Use No     History  Drug Use  . Types: Oxycodone, Cocaine    Comment: rarely- last 2015-     Social History   Social History  . Marital status: Married    Spouse name: N/A  . Number of children: 1  . Years of education: N/A   Occupational History  . disabled    Social History Main Topics  . Smoking status: Former Smoker    Packs/day: 1.00    Years: 35.00    Types: Cigarettes  . Smokeless tobacco: Never Used     Comment: 2018  . Alcohol use No  . Drug use: Yes    Types: Oxycodone, Cocaine     Comment: rarely- last 2015-  . Sexual activity: Not Asked   Other Topics Concern  . None   Social History Narrative   He is a separated father of 1. His daughter who is in nursing school lives with him part-time.   He is currently disabled due to chronic back pain.  He did some college education.   He currently is trying to use electronic cigarettes but has smoked one pack a day for over 35 years.   He has recently stopped using prescription benzodiazepine and pain medications because he is in the process of changing pain med doctors.   He does not routinely exercise secondary to back, neck pain as well as dyspnea.   Additional Social History:                         Sleep: restless  Appetite:  Fair  Current Medications: Current Facility-Administered Medications  Medication Dose Route Frequency Provider Last Rate Last Dose  . acetaminophen (TYLENOL) tablet 650 mg  650 mg Oral Q6H PRN Patrecia Pour, NP      . alum & mag hydroxide-simeth (MAALOX/MYLANTA) 200-200-20 MG/5ML suspension 30 mL  30 mL Oral Q4H PRN Patrecia Pour, NP      . amitriptyline (ELAVIL) tablet 20 mg  20 mg Oral QHS Ursula Alert, MD   20 mg at 10/28/16 2101  . amLODipine (NORVASC) tablet 5 mg  5 mg Oral QHS Patrecia Pour, NP   5 mg at 10/28/16 2102  . aspirin EC tablet 81 mg  81 mg Oral Daily Patrecia Pour, NP   81 mg at 10/29/16 3710  . busPIRone (BUSPAR) tablet 15 mg  15 mg Oral BID Patrecia Pour, NP   15 mg at 10/29/16 6269  . hydrOXYzine (ATARAX/VISTARIL) tablet 25 mg   25 mg Oral Q8H PRN Patrecia Pour, NP   25 mg at 10/29/16 1438  . ibuprofen (ADVIL,MOTRIN) tablet 600 mg  600 mg Oral Q8H PRN Patrecia Pour, NP      . lamoTRIgine (LAMICTAL) tablet 25 mg  25 mg Oral Daily Ursula Alert, MD   25 mg at 10/29/16 0813  . lamoTRIgine (LAMICTAL) tablet 50 mg  50 mg Oral QPM Agastya Meister, MD   50 mg at 10/28/16 1620  . levothyroxine (SYNTHROID, LEVOTHROID) tablet 75 mcg  75 mcg Oral QAC breakfast Patrecia Pour, NP   75 mcg at 10/29/16 4854  . magnesium hydroxide (MILK OF MAGNESIA) suspension 30 mL  30 mL Oral Daily PRN Patrecia Pour, NP      . metoprolol tartrate (LOPRESSOR) tablet 50 mg  50 mg Oral BID Patrecia Pour, NP   50 mg at 10/29/16 6270  . nicotine (NICODERM CQ - dosed in mg/24 hours) patch 14 mg  14 mg Transdermal Daily Ursula Alert, MD   14 mg at 10/29/16 0813  . nicotine polacrilex (NICORETTE) gum 2 mg  2 mg Oral PRN Ursula Alert, MD   2 mg at 10/28/16 0144  . OLANZapine (ZYPREXA) tablet 15 mg  15 mg Oral QHS Ursula Alert, MD   15 mg at 10/28/16 2102  . OLANZapine (ZYPREXA) tablet 5 mg  5 mg Oral Daily PRN Ursula Alert, MD      . ondansetron (ZOFRAN) tablet 4 mg  4 mg Oral Q8H PRN Patrecia Pour, NP      . oxyCODONE (Oxy IR/ROXICODONE) immediate release tablet 10 mg  10 mg Oral QID PRN Ursula Alert, MD   10 mg at 10/29/16 1208  . pantoprazole (PROTONIX) EC tablet 40 mg  40 mg Oral Daily Patrecia Pour, NP   40 mg at 10/29/16 3500    Lab Results:  Results for orders placed or performed during the hospital encounter of 10/27/16 (from  the past 48 hour(s))  TSH     Status: None   Collection Time: 10/29/16  6:05 AM  Result Value Ref Range   TSH 4.218 0.350 - 4.500 uIU/mL    Comment: Performed by a 3rd Generation assay with a functional sensitivity of <=0.01 uIU/mL. Performed at [facility], [place] [address] [place], [place] [address]   Lipid panel     Status: Abnormal   Collection Time: 10/29/16  6:05 AM   Result Value Ref Range   Cholesterol 245 (H) 0 - 200 mg/dL   Triglycerides 195 (H) <150 mg/dL   HDL 47 >40 mg/dL   Total CHOL/HDL Ratio 5.2 RATIO   VLDL 39 0 - 40 mg/dL   LDL Cholesterol 159 (H) 0 - 99 mg/dL    Comment:        Total Cholesterol/HDL:CHD Risk Coronary Heart Disease Risk Table                     Men   Women  1/2 Average Risk   3.4   3.3  Average Risk       5.0   4.4  2 X Average Risk   9.6   7.1  3 X Average Risk  23.4   11.0        Use the calculated Patient Ratio above and the CHD Risk Table to determine the patient's CHD Risk.        ATP III CLASSIFICATION (LDL):  <100     mg/dL   Optimal  100-129  mg/dL   Near or Above                    Optimal  130-159  mg/dL   Borderline  160-189  mg/dL   High  >190     mg/dL   Very High Performed at [place] [address] [place], [place] [address]     Blood Alcohol level:  Lab Results  Component Value Date   [facility] <5 10/26/2016   ETH <11 63/78/5885    Metabolic Disorder Labs: Lab Results  Component Value Date   HGBA1C 5.2 12/26/2015   MPG 103 12/26/2015   MPG 100 12/26/2012   No results found for: PROLACTIN Lab Results  Component Value Date   CHOL 245 (H) 10/29/2016   TRIG 195 (H) 10/29/2016   HDL 47 10/29/2016   CHOLHDL 5.2 10/29/2016   VLDL 39 10/29/2016   LDLCALC 159 (H) 10/29/2016   LDLCALC 176 (H) 12/27/2015    Physical Findings: AIMS: Facial and Oral Movements Muscles of Facial Expression: None, normal Lips and Perioral Area: None, normal Jaw: None, normal Tongue: None, normal,Extremity Movements Upper (arms, wrists, hands, fingers): None, normal Lower (legs, knees, ankles, toes): None, normal, Trunk Movements Neck, shoulders, hips: None, normal, Overall Severity Severity of abnormal movements (highest score from questions above): None, normal Incapacitation due to abnormal movements: None, normal Patient's awareness of abnormal movements (rate only patient's report): No  Awareness, Dental Status Current problems with teeth and/or dentures?: No Does patient usually wear dentures?: No  CIWA:    COWS:     Musculoskeletal: Strength & Muscle Tone: within normal limits Gait & Station:walks with walker Patient leans: N/A  Psychiatric Specialty Exam: Physical Exam  Nursing note and vitals reviewed.   Review of Systems  Musculoskeletal: Positive for back pain.  Psychiatric/Behavioral: Positive for depression. The patient is nervous/anxious and has insomnia.   All other systems reviewed  and are negative.   Blood pressure 101/66, pulse 83, temperature 98.3 F (36.8 C), temperature source Oral, resp. rate 16, height 6' (1.829 m), weight 99.8 kg (220 lb), SpO2 100 %.Body mass index is 29.84 kg/m.  General Appearance: Casual  Eye Contact:  Fair  Speech:  Clear and Coherent  Volume:  Normal  Mood:  Anxious and Dysphoric  Affect:  Depressed  Thought Process:  Goal Directed and Descriptions of Associations: Intact  Orientation:  Full (Time, Place, and Person)  Thought Content:  Hallucinations: Auditory and Rumination  Suicidal Thoughts:  denies  Homicidal Thoughts:  No  Memory:  Immediate;   Fair Recent;   Fair Remote;   Fair  Judgement:  Fair  Insight:  Fair  Psychomotor Activity:  Normal  Concentration:  Concentration: Fair and Attention Span: Fair  Recall:  [employer] of Knowledge:  Fair  Language:  Fair  Akathisia:  No  Handed:  Right  AIMS (if indicated):     Assets:  Communication Skills Desire for Improvement  ADL's:  Intact  Cognition:  WNL  Sleep:  Number of Hours: 3.25     Treatment Plan Summary:Tab is a 85 y old CM, who has a hx of Bipolar do , chronic pain , on multiple medications , presents today as improving, psychosis is improving , sleep is still restless , mostly due to pain, continue treatment. Daily contact with patient to assess and evaluate symptoms and progress in treatment, Medication management and Plan see  below   Continue Zyprexa 15 mg PO qhs for psychosis. Continue Lamictal 75 mg po in divided doses for mood sx. Continue Buspar 15 mg PO bid for anxiety sx. Elavil 20 mg po qhs for sleep issues/pain. Reviewed labs - Lipid panel - abnormal - dietician consult. TSH - wnl. Reviewed EKG for qtc - wnl  CSW will continue to work on disposition.  Emmelina Mcloughlin, MD 10/29/2016, 2:43 PM

## 2016-10-29 NOTE — Progress Notes (Signed)
Patient denies SI, HI and AVH.  Patient reports chronic pain and anxiety.  Patient states that his anxiety has been improving but it is still there. Patient has required use of prn pain medications for pain management this shift.   Assess patient for safety, offer medications as prescribed, engage patient in 1:1 staff talks.   Continue to monitor as planned. Patient able to contract for safety.

## 2016-10-29 NOTE — Progress Notes (Signed)
Adult Psychoeducational Group Note  Date:  10/29/2016 Time:  9:09 PM  Group Topic/Focus:  Wrap-Up Group:   The focus of this group is to help patients review their daily goal of treatment and discuss progress on daily workbooks.  Participation Level:  Active  Participation Quality:  Appropriate  Affect:  Appropriate  Cognitive:  Alert  Insight: Appropriate  Engagement in Group:  Engaged  Modes of Intervention:  Discussion  Additional Comments: Patient rated his day a 7. Patient's goal for today was to adjust to medication.   Yarianna Varble L Jolean Madariaga 10/29/2016, 9:09 PM

## 2016-10-29 NOTE — [facility] Group Notes (Signed)
[facility] LCSW Group Therapy  10/29/2016 1:15 pm  Type of Therapy: Process Group Therapy  Participation Level:  Active  Participation Quality:  Appropriate  Affect:  Flat  Cognitive:  Oriented  Insight:  Improving  Engagement in Group:  Limited  Engagement in Therapy:  Limited  Modes of Intervention:  Activity, Clarification, Education, Problem-solving and Support  Summary of Progress/Problems: Today's group addressed the issue of overcoming obstacles.  Patients were asked to identify their biggest obstacle post d/c that stands in the way of their on-going success, and then problem solve as to how to manage this. Stayed the entire time, engaged throughout.  Talked about addiction as something that has been an on-going struggle throughout his life, and how he needs to learn to forgive himself when he messes up so he can feel like he has a clean slate and move forward.  [name] 10/29/2016   3:05 PM

## 2016-10-29 NOTE — Plan of Care (Signed)
Problem: Safety: Goal: Periods of time without injury will increase Outcome: Progressing Pt. denies SI/HI, +AVH, remains a high fall risk (uses walker), Q 15 checks in effect.

## 2016-10-29 NOTE — Progress Notes (Signed)
  DATA ACTION RESPONSE  Objective- Pt. is visible in the dayroom, seen interacting with peers and watching TV. Presents with an animated/anxious affect and mood. No further c/o. No abnormal s/s.  Subjective- Denies having any SI/HI/Pain at this time. +AVH stating "I hear random people talking to me. I hear my brother telling me to hurt myself. He is mad because he passed away first. I see people standing there". Is cooperative and remain safe on the unit.  1:1 interaction in private to establish rapport. Encouragement, education, & support given from staff.  PRN Vistaril requested and will re-eval accordingly.   Safety maintained with Q 15 checks. Continue with POC.

## 2016-10-29 NOTE — Progress Notes (Signed)
Recreation Therapy Notes  Date: 10/29/16 Time: 1000 Location: 500 Hall Dayroom  Group Topic: Wellness  Goal Area(s) Addresses:  Patient will define components of whole wellness. Patient will verbalize benefit of whole wellness.  Intervention:  Chairs, beach ball  Activity:  Keep It Chartered certified accountant.  Patients were seated in a circle.  Patients were to pass the ball back and forth to each other.  LRT would count the number of hits the patients got on the ball.  Patients could bounce the ball off of the floor but the ball could not come to a complete stop.  If the ball stopped the count would start from the beginning.   Education: Wellness, Dentist.   Education Outcome: Acknowledges education/In group clarification offered/Needs additional education.   Clinical Observations/Feedback: Pt did not attend group.   Victorino Sparrow, LRT/CTRS         Victorino Sparrow A 10/29/2016 12:09 PM

## 2016-10-30 DIAGNOSIS — F1490 Cocaine use, unspecified, uncomplicated: Secondary | ICD-10-CM

## 2016-10-30 DIAGNOSIS — Z87891 Personal history of nicotine dependence: Secondary | ICD-10-CM

## 2016-10-30 DIAGNOSIS — F1190 Opioid use, unspecified, uncomplicated: Secondary | ICD-10-CM

## 2016-10-30 LAB — HEMOGLOBIN A1C
Hgb A1c MFr Bld: 4.6 % — ABNORMAL LOW (ref 4.8–5.6)
Mean Plasma Glucose: 85 mg/dL

## 2016-10-30 LAB — PROLACTIN: Prolactin: 27.7 ng/mL — ABNORMAL HIGH (ref 4.0–15.2)

## 2016-10-30 MED ORDER — AMLODIPINE BESYLATE 5 MG PO TABS: 5.0000 mg | ORAL_TABLET | Freq: Every day | ORAL | 0 refills | Status: DC

## 2016-10-30 MED ORDER — BUSPIRONE HCL 15 MG PO TABS: 15.0000 mg | ORAL_TABLET | Freq: Two times a day (BID) | ORAL | 0 refills | Status: DC

## 2016-10-30 MED ORDER — OXYCODONE HCL 10 MG PO TABS: 10.0000 mg | ORAL_TABLET | Freq: Four times a day (QID) | ORAL | 0 refills | Status: DC

## 2016-10-30 MED ORDER — LEVOTHYROXINE SODIUM 75 MCG PO TABS: 75.0000 ug | ORAL_TABLET | Freq: Every day | ORAL | Status: DC

## 2016-10-30 MED ORDER — METOPROLOL TARTRATE 50 MG PO TABS: 50.0000 mg | ORAL_TABLET | Freq: Two times a day (BID) | ORAL | 0 refills | Status: DC

## 2016-10-30 MED ORDER — ESOMEPRAZOLE MAGNESIUM 40 MG PO CPDR: 40.0000 mg | DELAYED_RELEASE_CAPSULE | Freq: Every day | ORAL | 0 refills | Status: DC

## 2016-10-30 MED ORDER — AMITRIPTYLINE HCL 10 MG PO TABS: 20.0000 mg | ORAL_TABLET | Freq: Every day | ORAL | 0 refills | Status: DC

## 2016-10-30 MED ORDER — NICOTINE 14 MG/24HR TD PT24: 14.0000 mg | MEDICATED_PATCH | Freq: Every day | TRANSDERMAL | 0 refills | Status: DC

## 2016-10-30 MED ORDER — HYDROXYZINE HCL 25 MG PO TABS: 25.0000 mg | ORAL_TABLET | Freq: Three times a day (TID) | ORAL | 0 refills | Status: DC | PRN

## 2016-10-30 MED ORDER — ASPIRIN EC 81 MG PO TBEC: 81.0000 mg | DELAYED_RELEASE_TABLET | Freq: Every day | ORAL | Status: DC

## 2016-10-30 MED ORDER — OLANZAPINE 15 MG PO TABS: 15.0000 mg | ORAL_TABLET | Freq: Every day | ORAL | 0 refills | Status: DC

## 2016-10-30 MED ORDER — LAMOTRIGINE 25 MG PO TABS: ORAL_TABLET | ORAL | 0 refills | Status: DC

## 2016-10-30 NOTE — Progress Notes (Signed)
Patient was in a cheerful mood upon discharge.  Patient stated that he was ready to return home and he had the support of this wife. Patient acknowledged understanding of all discharge instructions and receipt of all belongings.  Patient was discharged in the company of his wife.

## 2016-10-30 NOTE — Progress Notes (Signed)
  [facility] Adult Case Management Discharge Plan :  Will you be returning to the same living situation after discharge:  Yes,  home At discharge, do you have transportation home?: Yes,  wife Do you have the ability to pay for your medications: Yes,  insurance  Release of information consent forms completed and in the chart;  Patient's signature needed at discharge.  Patient to Follow up at: [facility] on 11/27/2016.   Why:  at 2:15pm for medication management with Dr. Darleene Cleaver. Contact information: [address] [place] [address] [phone]        Med[facility], [facility] Follow up on 11/07/2016.   Specialty:  Behavioral Health Why:  at 12:00pm for therapy with Dr. Vita Barley. Please arrive at 11:30am to complete new patient paperwork.  Contact information: [place] [address] [phone]           Next level of care provider has access to [place] and Suicide Prevention discussed: Yes,  yes  Have you used any form of tobacco in the last 30 days? (Cigarettes, Smokeless Tobacco, Cigars, and/or Pipes): Yes  Has patient been referred to the Quitline?: Patient refused referral  Patient has been referred for addiction treatment: Yes  Trish Mage 10/30/2016, 11:52 AM

## 2016-10-30 NOTE — Tx Team (Signed)
Interdisciplinary Treatment and Diagnostic Plan Update  10/30/2016 Time of Session: 9:30am [name] MRN: [id number]  Principal Diagnosis: Bipolar disorder, curr episode mixed, severe, with psychotic features ([place])  Secondary Diagnoses: Principal Problem:   Bipolar disorder, curr episode mixed, severe, with psychotic features ([place]) Active Problems:   Tobacco use disorder, continuous   Chronic pain syndrome   Cannabis use disorder, mild, abuse   Current Medications:  Current Facility-Administered Medications  Medication Dose Route Frequency Provider Last Rate Last Dose  . acetaminophen (TYLENOL) tablet 650 mg  650 mg Oral Q6H PRN Patrecia Pour, NP      . alum & mag hydroxide-simeth (MAALOX/MYLANTA) 200-200-20 MG/5ML suspension 30 mL  30 mL Oral Q4H PRN Patrecia Pour, NP      . amitriptyline (ELAVIL) tablet 20 mg  20 mg Oral QHS Ursula Alert, MD   20 mg at 10/29/16 2121  . amLODipine (NORVASC) tablet 5 mg  5 mg Oral QHS Patrecia Pour, NP   5 mg at 10/29/16 2121  . aspirin EC tablet 81 mg  81 mg Oral Daily Patrecia Pour, NP   81 mg at 10/30/16 9379  . busPIRone (BUSPAR) tablet 15 mg  15 mg Oral BID Patrecia Pour, NP   15 mg at 10/30/16 0240  . hydrOXYzine (ATARAX/VISTARIL) tablet 25 mg  25 mg Oral Q8H PRN Patrecia Pour, NP   25 mg at 10/29/16 2234  . ibuprofen (ADVIL,MOTRIN) tablet 600 mg  600 mg Oral Q8H PRN Patrecia Pour, NP      . lamoTRIgine (LAMICTAL) tablet 25 mg  25 mg Oral Daily Ursula Alert, MD   25 mg at 10/30/16 0727  . lamoTRIgine (LAMICTAL) tablet 50 mg  50 mg Oral QPM Eappen, Saramma, MD   50 mg at 10/29/16 1733  . levothyroxine (SYNTHROID, LEVOTHROID) tablet 75 mcg  75 mcg Oral QAC breakfast Patrecia Pour, NP   75 mcg at 10/30/16 9735  . magnesium hydroxide (MILK OF MAGNESIA) suspension 30 mL  30 mL Oral Daily PRN Patrecia Pour, NP      . metoprolol tartrate (LOPRESSOR) tablet 50 mg  50 mg Oral BID Patrecia Pour, NP   50 mg at 10/30/16 3299  .  nicotine (NICODERM CQ - dosed in mg/24 hours) patch 14 mg  14 mg Transdermal Daily Ursula Alert, MD   14 mg at 10/30/16 0724  . nicotine polacrilex (NICORETTE) gum 2 mg  2 mg Oral PRN Ursula Alert, MD   2 mg at 10/28/16 0144  . OLANZapine (ZYPREXA) tablet 15 mg  15 mg Oral QHS Ursula Alert, MD   15 mg at 10/29/16 2121  . OLANZapine (ZYPREXA) tablet 5 mg  5 mg Oral Daily PRN Eappen, Ria Clock, MD      . ondansetron (ZOFRAN) tablet 4 mg  4 mg Oral Q8H PRN Patrecia Pour, NP      . oxyCODONE (Oxy IR/ROXICODONE) immediate release tablet 10 mg  10 mg Oral QID PRN Ursula Alert, MD   10 mg at 10/30/16 2426  . pantoprazole (PROTONIX) EC tablet 40 mg  40 mg Oral Daily Patrecia Pour, NP   40 mg at 10/30/16 8341    PTA Medications: Prescriptions Prior to Admission  Medication Sig Dispense Refill Last Dose  . hydrOXYzine (ATARAX/VISTARIL) 25 MG tablet Take 1 tablet (25 mg total) by mouth every 8 (eight) hours as needed. (Patient taking differently: Take 50 mg by mouth 3 (three) times daily. )  60 tablet 0 10/25/2016 at Unknown time  . lamoTRIgine (LAMICTAL) 25 MG tablet Take 25 mg by mouth 2 (two) times daily.   10/25/2016 at Unknown time  . multivitamin (RENA-VIT) TABS tablet Take 1 tablet by mouth at bedtime. (Patient not taking: Reported on 10/26/2016) 30 tablet 0 Not Taking at Unknown time  . nitroGLYCERIN (NITROSTAT) 0.4 MG SL tablet Place 0.4 mg under the tongue every 5 (five) hours as needed for chest pain.    unk  . Nutritional Supplements (FEEDING SUPPLEMENT, NEPRO CARB STEADY,) LIQD Take 237 mLs by mouth 2 (two) times daily between meals. (Patient not taking: Reported on 09/10/2016) 60 Can 0 Not Taking at Unknown time  . OLANZapine (ZYPREXA) 7.5 MG tablet Take 7.5 mg by mouth at bedtime.   10/25/2016  . promethazine (PHENERGAN) 25 MG tablet Take 1 tablet (25 mg total) by mouth every 6 (six) hours as needed for nausea or vomiting. 30 tablet 0 unk  . Vitamin D, Ergocalciferol, (DRISDOL) 50000  units CAPS capsule Take 50,000 Units by mouth every 7 (seven) days.   10/23/2016  . [DISCONTINUED] amLODipine (NORVASC) 5 MG tablet Take 1 tablet (5 mg total) by mouth at bedtime. 30 tablet 0 10/25/2016 at Unknown time  . [DISCONTINUED] aspirin EC 81 MG tablet Take 81 mg by mouth daily.   10/25/2016 at Unknown time  . [DISCONTINUED] busPIRone (BUSPAR) 15 MG tablet Take 15 mg by mouth 2 (two) times daily.   10/25/2016 at Unknown time  . [DISCONTINUED] esomeprazole (NEXIUM) 40 MG capsule Take 40 mg by mouth daily at 12 noon.   10/25/2016 at Unknown time  . [DISCONTINUED] levothyroxine (SYNTHROID, LEVOTHROID) 75 MCG tablet Take 75 mcg by mouth daily before breakfast.   10/25/2016 at Unknown time  . [DISCONTINUED] metoprolol (LOPRESSOR) 50 MG tablet Take 1 tablet (50 mg total) by mouth 2 (two) times daily. (Patient taking differently: Take 25 mg by mouth 2 (two) times daily. ) 60 tablet 0 10/25/2016 at 1800  . [DISCONTINUED] Oxycodone HCl 10 MG TABS Take 10 mg by mouth 4 (four) times daily.    10/25/2016 at Unknown time    Treatment Modalities: Medication Management, Group therapy, Case management,  1 to 1 session with clinician, Psychoeducation, Recreational therapy.  Patient Stressors: Health problems Medication change or noncompliance  Patient Strengths: Ability for insight Average or above average intelligence Capable of independent living General fund of knowledge Motivation for treatment/growth  Physician Treatment Plan for Primary Diagnosis: Bipolar disorder, curr episode mixed, severe, with psychotic features ([place]) Long Term Goal(s): Improvement in symptoms so as ready for discharge  Short Term Goals: Ability to verbalize feelings will improve Ability to identify and develop effective coping behaviors will improve Compliance with prescribed medications will improve Ability to verbalize feelings will improve Ability to identify and develop effective coping behaviors will improve Compliance  with prescribed medications will improve  Medication Management: Evaluate patient's response, side effects, and tolerance of medication regimen.  Therapeutic Interventions: 1 to 1 sessions, Unit Group sessions and Medication administration.  Evaluation of Outcomes: Adequate for Discharge  Physician Treatment Plan for Secondary Diagnosis: Principal Problem:   Bipolar disorder, curr episode mixed, severe, with psychotic features ([place]) Active Problems:   Tobacco use disorder, continuous   Chronic pain syndrome   Cannabis use disorder, mild, abuse   Long Term Goal(s): Improvement in symptoms so as ready for discharge  Short Term Goals: Ability to verbalize feelings will improve Ability to identify and develop effective coping behaviors will improve  Compliance with prescribed medications will improve Ability to verbalize feelings will improve Ability to identify and develop effective coping behaviors will improve Compliance with prescribed medications will improve  Medication Management: Evaluate patient's response, side effects, and tolerance of medication regimen.  Therapeutic Interventions: 1 to 1 sessions, Unit Group sessions and Medication administration.  Evaluation of Outcomes: Adequate for Discharge   RN Treatment Plan for Primary Diagnosis: Bipolar disorder, curr episode mixed, severe, with psychotic features ([place]) Long Term Goal(s): Knowledge of disease and therapeutic regimen to maintain health will improve  Short Term Goals: Ability to verbalize feelings will improve, Ability to disclose and discuss suicidal ideas and Ability to identify and develop effective coping behaviors will improve  Medication Management: RN will administer medications as ordered by provider, will assess and evaluate patient's response and provide education to patient for prescribed medication. RN will report any adverse and/or side effects to prescribing provider.  Therapeutic Interventions: 1 on  1 counseling sessions, Psychoeducation, Medication administration, Evaluate responses to treatment, Monitor vital signs and CBGs as ordered, Perform/monitor CIWA, COWS, AIMS and Fall Risk screenings as ordered, Perform wound care treatments as ordered.  Evaluation of Outcomes: Adequate for Discharge    Recreational Therapy Treatment Plan for Primary Diagnosis: Bipolar disorder, curr episode mixed, severe, with psychotic features ([place]) Long Term Goal(s): Patient will participate in recreation therapy treatment in at least 2 group sessions without prompting from LRT  Short Term Goals: Patient will be able to identify at least 5 coping skills for admitting diagnosis by conclusion of recreation therapy treatment  Treatment Modalities: Group and Pet Therapy  Therapeutic Interventions: Psychoeducation  Evaluation of Outcomes: Adequate for Discharge   LCSW Treatment Plan for Primary Diagnosis: Bipolar disorder, curr episode mixed, severe, with psychotic features ([place]) Long Term Goal(s): Safe transition to appropriate next level of care at discharge, Engage patient in therapeutic group addressing interpersonal concerns.  Short Term Goals: Engage patient in aftercare planning with referrals and resources, Identify triggers associated with mental health/substance abuse issues and Increase skills for wellness and recovery  Therapeutic Interventions: Assess for all discharge needs, 1 to 1 time with Social worker, Explore available resources and support systems, Assess for adequacy in community support network, Educate family and significant other(s) on suicide prevention, Complete Psychosocial Assessment, Interpersonal group therapy.  Evaluation of Outcomes: Adequate for Discharge   Progress in Treatment: Attending groups: Yes Participating in groups: Yes  Taking medication as prescribed: Yes, Toleration medication: Yes, no side effects reported at this time Family/Significant other contact made:  Yes Patient understands diagnosis: Continuing to assess Discussing patient identified problems/goals with staff: Yes Medical problems stabilized or resolved: Yes Denies suicidal/homicidal ideation: Yes Issues/concerns per patient self-inventory: None Other: N/A  New problem(s) identified: None identified at this time.   New Short Term/Long Term Goal(s): None identified at this time.   Discharge Plan or Barriers: Pt will return home and follow-up with outpatient services at [facility]  Reason for Continuation of Hospitalization:   Estimated Length of Stay: D/C today  Attendees: Patient: 10/30/2016  11:50 AM  Physician: Dr. Shea Evans 10/30/2016  11:50 AM  Nursing: Shella Maxim RN 10/30/2016  11:50 AM  RN Care Manager: Lars Pinks, RN 10/30/2016  11:50 AM  Social Worker: Ripley Fraise, [place] 10/30/2016  11:50 AM  Recreational Therapist:  Victorino Sparrow, LRT/CTRS 10/30/2016  11:50 AM  Other: Lindell Spar, NP; Samuel Jester, NP 10/30/2016  11:50 AM  Other:  10/30/2016  11:50 AM  Other: 10/30/2016  11:50 AM  Scribe for Treatment Team: Gladstone Lighter, LCSW 10/30/2016 11:50 AM

## 2016-10-30 NOTE — Progress Notes (Signed)
Recreation Therapy Notes  Date: 10/30/16 Time: 1000 Location: 500 Hall Dayroom  Group Topic: Anger Management  Goal Area(s) Addresses:  Patient will identify triggers for anger.  Patient will identify physical reaction to anger.   Patient will identify benefit of using coping skills when angry.  Behavioral Response: Engaged  Intervention: Worksheets, pencils  Activity: Umbrella of Emotion.  Patients were given a picture of an umbrella.  In the umbrella, patients were to identify underlying emotions to their anger.  If they were unable to name specific emotions that the anger is covering, they were to identify circumstances and situations that get them angry.  Education:Anger Management, Discharge Planning   Education Outcome: Acknowledges education/In group clarification offered/Needs additional education.   Clinical Observations/Feedback: Pt said one of the physical signs of anger is you blood pressure rising.  Pt went on to say that traffic, busy work, wasting time, stealing, getting frustrated with himself and anxiety get him angry.  Pt stated using deep breathing, laughing, counting from 10 backwards and channeling anger to positive things are his coping skills.  Pt stated using his coping skills will "help me move forward in treatment options and achieve short term goals in order to progress to long term goals".     Victorino Sparrow, LRT/CTRS         Victorino Sparrow A 10/30/2016 12:49 PM

## 2016-10-30 NOTE — Plan of Care (Signed)
Problem: [facility] Participation in Recreation Therapeutic Interventions Goal: STG-Patient will identify at least five coping skills for ** STG: Coping Skills - Patient will be able to identify at least 5 coping skills for hearing voices by conclusion of recreation therapy tx  Outcome: Completed/Met Date Met: 10/30/16 Pt was able to identify coping skills at completion of coping skills recreation therapy session.  Victorino Sparrow, LRT/CTRS

## 2016-10-30 NOTE — [facility] Suicide Risk Assessment (Signed)
[facility] Discharge Suicide Risk Assessment   Principal Problem: Bipolar disorder, curr episode mixed, severe, with psychotic features ([place])  H&P: Patient is a 51 year old male, separated from his wife, on SSI, lives by himself in [place], [place]. Patient been diagnosed with bipolar disorder, chronic pain, has history of multiple surgeries, fibromyalgia and was admitted for worsening of mood along with command hallucinations. Next  Patient on admission was anxious, labile, depressed and stated that he had a lot of financial stressors, recently separated from his wife, was socially isolated and that his multiple medical issues were making him more depressed.  Patient this morning, reports that he is doing fairly well, adds that he is no longer hearing voices, does not feel depressed, reports improvement in his pain and adds that he is ready to be discharged. He states that his wife does bring his medications every 3 days for him and that he takes them regularly  Patient has that he is sleeping well, on a scale of 0-10 with 0 being no symptoms in 10 being the worst, reports his depression is a 2 out of 10. He has that he does not feel anxious, is not having any thoughts of hurting himself or others. He has that he does want to get better and plans to follow-up with outpatient services. He denies any side effects with his medications, any concern this morning Discharge Diagnoses:  Patient Active Problem List   Diagnosis Date Noted  . Chronic pain syndrome [G89.4] 10/28/2016  . Cannabis use disorder, mild, abuse [F12.10] 10/28/2016  . Rhabdomyolysis [M62.82] 08/23/2016  . AKI (acute kidney injury) ([place]) [N17.9] 08/23/2016  . Transaminitis [R74.0] 08/23/2016  . Drug overdose, multiple drugs [T50.901A] 08/23/2016  . Tachycardia [R00.0] 08/23/2016  . Accelerated hypertension [I10] 08/23/2016  . Bipolar disorder, curr episode mixed, severe, with psychotic features ([place]) [F31.64] 12/26/2015  .  Cocaine use disorder, mild, abuse [F14.10] 12/26/2015  . History of rhabdomyolysis [Z87.39] 12/26/2015  . Liver injury [S36.119A] 12/26/2015  . Hx of renal failure [Z87.448] 12/26/2015  . Angina, class III ([place]) [I20.9] 08/18/2015  . Dyslipidemia, goal LDL below 70 [E78.5] 08/18/2015  . Family history of premature coronary artery disease [Z82.49] 08/18/2015  . Tobacco use disorder, continuous [F17.209] 08/18/2015  . Tibial plateau fracture [S82.143A] 10/02/2013  . Multiple falls [R29.6] 10/02/2013  . Alteration in self-care ability [R53.81] 10/02/2013  . 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 Fracture of tibia with fibula, left, closed [S82.202A, S82.402A] 10/02/2013  . Chronic back pain [M54.9, G89.29] 10/02/2013  . 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 History of suicidal ideation [Z86.59] 10/02/2013  . Postural imbalance with levoscoliosis, h/o [R29.3] 10/02/2013  . Chest pain [R07.9] 12/25/2012  . IRRITABLE BOWEL SYNDROME [K58.9] 08/09/2008  . HYPERLIPIDEMIA [E78.5] 10/28/2006  . DISORDER, BIPOLAR NOS [F31.9] 10/28/2006  . ANXIETY [F41.1] 10/28/2006  . DEPRESSION [F32.9] 10/28/2006  . PAIN, CHRONIC NEC [G89.29] 10/28/2006  . GERD [K21.9] 10/28/2006    Total Time spent with patient: 30 minutes  Musculoskeletal: Strength & Muscle Tone: within normal limits Gait & Station: normal Patient leans: N/A  Psychiatric Specialty Exam: Review of Systems  Constitutional: Negative.  Negative for fever, malaise/fatigue and weight loss.  HENT: Negative.  Negative for congestion and sore throat.   Eyes: Negative.  Negative for blurred vision, double vision and pain.  Respiratory: Negative.  Negative for cough, shortness of breath and wheezing.   Cardiovascular: Negative.  Negative for chest pain and palpitations.  Gastrointestinal: Negative.  Negative  for abdominal pain, heartburn, nausea and vomiting.  Genitourinary: Negative.  Negative for  dysuria.  Musculoskeletal: Negative for falls and myalgias.  Skin: Negative.  Negative for rash.  Neurological: Negative.  Negative for dizziness, tingling, tremors, focal weakness, seizures, loss of consciousness and headaches.  Endo/Heme/Allergies: Negative.  Negative for environmental allergies.  Psychiatric/Behavioral: Negative.  Negative for depression, hallucinations, substance abuse and suicidal ideas. The patient is not nervous/anxious and does not have insomnia.     Blood pressure 121/82, pulse 67, temperature 98.3 F (36.8 C), temperature source Oral, resp. rate 16, height 6' (1.829 m), weight 99.8 kg (220 lb), SpO2 100 %.Body mass index is 29.84 kg/m.  General Appearance: Casual  Eye Contact::  Good  Speech:  Clear and Coherent and Normal Rate409  Volume:  Normal  Mood:  Euthymic  Affect:  Congruent and Full Range  Thought Process:  Coherent, Goal Directed and Descriptions of Associations: Intact  Orientation:  Full (Time, Place, and Person)  Thought Content:  WDL  Suicidal Thoughts:  No  Homicidal Thoughts:  No  Memory:  Immediate;   Fair Recent;   Fair Remote;   Fair  Judgement:  Intact  Insight:  Present  Psychomotor Activity:  Normal  Concentration:  Good  Recall:  [employer] of Knowledge:Fair  Language: Fair  Akathisia:  No  Handed:  Right  AIMS (if indicated):     Assets:  Communication Skills Desire for Improvement Housing Social Support Transportation  Sleep:  Number of Hours: 4  Cognition: WNL  ADL's:  Intact   Mental Status Per Nursing Assessment::   On Admission:     Demographic Factors:  Male, Divorced or widowed, Caucasian and Living alone  Loss Factors: Financial problems/change in socioeconomic status  Historical Factors: NA  Risk Reduction Factors:   Religious beliefs about death and Positive social support  Continued Clinical Symptoms:  Previous Psychiatric Diagnoses and Treatments Medical Diagnoses and  Treatments/Surgeries  Cognitive Features That Contribute To Risk:  None    Suicide Risk:  Minimal: No identifiable suicidal ideation.  Patients presenting with no risk factors but with morbid ruminations; may be classified as minimal risk based on the severity of the depressive symptoms  [place] Follow up on 11/27/2016.   Why:  at 2:15pm for medication management with Dr. Darleene Cleaver. Contact information: [address] [place] [address] [phone]        [facility] Follow up on 11/07/2016.   Specialty:  Behavioral Health Why:  at 12:00pm for therapy with Dr. Vita Barley. Please arrive at 11:30am to complete new patient paperwork.  Contact information: [address] [place] [address] [phone]           Plan Of Care/Follow-up recommendations:  Activity:  As tolerated Diet:  Heart healthy diet Other:  To keep follow-up appointments and take medications regularlyAlso discussed with patient his lipid profile, the need to have a heart healthy diet which is low in fat. Discussed the need to follow-up with his PCP regularly in regards to his high cholesterol, high LDL and high triglycerides   Fortunata Betty, MD 10/30/2016, 11:11 AM

## 2016-10-30 NOTE — Progress Notes (Signed)
Nutrition Note  Consult for hyperlipidemia education received. Per protocol, RN to provide pt with copy of packet outlining general healthy eating in addition to hyperlipidemia-specific information.   No further nutrition intervention warranted at this time. Please re-consult if further nutrition-related issues arise.  Clayton Bibles, MS, RD, LDN Pager: [phone] After Hours Pager: [phone]

## 2016-10-30 NOTE — Discharge Summary (Signed)
Physician Discharge Summary Note  Patient:  [name] is an 51 y.o., male MRN:  [id number] DOB:  [birth year] Patient phone:  [phone] (home)  Patient address:   [place] [address],  Total Time spent with patient: Greater than 30 minutes  Date of Admission:  10/27/2016 Date of Discharge: 10/30/16  Reason for Admission: Worsening symptoms of Bipolar disorder with auditory hallucinations.  Principal Problem: Bipolar disorder, curr episode mixed, severe, with psychotic features [facility])  Discharge Diagnoses: Patient Active Problem List   Diagnosis Date Noted  . Chronic pain syndrome [G89.4] 10/28/2016  . Cannabis use disorder, mild, abuse [F12.10] 10/28/2016  . Rhabdomyolysis [M62.82] 08/23/2016  . AKI (acute kidney injury) ([place]) [N17.9] 08/23/2016  . Transaminitis [R74.0] 08/23/2016  . Drug overdose, multiple drugs [T50.901A] 08/23/2016  . Tachycardia [R00.0] 08/23/2016  . Accelerated hypertension [I10] 08/23/2016  . Bipolar disorder, curr episode mixed, severe, with psychotic features ([place]) [F31.64] 12/26/2015  . Cocaine use disorder, mild, abuse [F14.10] 12/26/2015  . History of rhabdomyolysis [Z87.39] 12/26/2015  . Liver injury [S36.119A] 12/26/2015  . Hx of renal failure [Z87.448] 12/26/2015  . Angina, class III ([place]) [I20.9] 08/18/2015  . Dyslipidemia, goal LDL below 70 [E78.5] 08/18/2015  . Family history of premature coronary artery disease [Z82.49] 08/18/2015  . Tobacco use disorder, continuous [F17.209] 08/18/2015  . Tibial plateau fracture [S82.143A] 10/02/2013  . Multiple falls [R29.6] 10/02/2013  . Alteration in self-care ability [R53.81] 10/02/2013  . 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 Fracture of tibia with fibula, left, closed [S82.202A, S82.402A] 10/02/2013  . Chronic back pain [M54.9, G89.29] 10/02/2013  . 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 History of suicidal ideation [Z86.59]  10/02/2013  . Postural imbalance with levoscoliosis, h/o [R29.3] 10/02/2013  . Chest pain [R07.9] 12/25/2012  . IRRITABLE BOWEL SYNDROME [K58.9] 08/09/2008  . HYPERLIPIDEMIA [E78.5] 10/28/2006  . DISORDER, BIPOLAR NOS [F31.9] 10/28/2006  . ANXIETY [F41.1] 10/28/2006  . DEPRESSION [F32.9] 10/28/2006  . PAIN, CHRONIC NEC [G89.29] 10/28/2006  . GERD [K21.9] 10/28/2006   Past Psychiatric History: Bipolar disorder  Past Medical History:  Past Medical History:  Diagnosis Date  . Accelerated hypertension 08/23/2016  . Anxiety   . Arthritis   . Bipolar affective disorder ([place])   . Chronic back pain   . Chronic kidney disease    AKF - due to Rhabdomyolysis  . DDD (degenerative disc disease)   . Depression   . DJD (degenerative joint disease)   . Elevated liver function tests   . Fibromyalgia   . GERD (gastroesophageal reflux disease)   . Head injury   . Head injury, closed, with concussion    x 3  . Hyperlipidemia   . Hypothyroidism   . IBS (irritable bowel syndrome)   . Inguinal hernia    left  . Insomnia   . Myofascial pain syndrome   . Osteopenia   . Shortness of breath dyspnea    with exertion  . Tibial plateau fracture, left     Past Surgical History:  Procedure Laterality Date  . CARDIAC CATHETERIZATION N/A 08/31/2015   Procedure: Left Heart Cath and Coronary Angiography;  Surgeon: Leonie Man, MD;  Location: [facility];  Service: Cardiovascular;  Laterality: N/A;  . Carotid Dopplers Bilateral 02/20/2015   [facility]: Mild, less than 39% left and right internal carotid artery stenosis. No significant plaque burden  . CERVICAL DISC SURGERY     C5-7  . COLONOSCOPY    .  COLONOSCOPY    . ESOPHAGOGASTRODUODENOSCOPY    . FASCIOTOMY Left 10/20/2013   Procedure: LEFT leg ANTERIOR COMPARTMENT FACSCIOTOMY;  Surgeon: Rozanna Box, MD;  Location: [place];  Service: Orthopedics;  Laterality: Left;  . INGUINAL HERNIA REPAIR Left   . Nuclear Stress Test   03/06/2015   [facility]: Normal EKG. Low normal EF (49%) normal regional wall motion. No evidence of ischemia or infarction.  . ORIF TIBIA PLATEAU Left 10/20/2013   Procedure: OPEN REDUCTION INTERNAL FIXATION (ORIF) LEFT TIBIAL PLATEAU;  Surgeon: Rozanna Box, MD;  Location: [place];  Service: Orthopedics;  Laterality: Left;  . SEPTOPLASTY    . SPINAL CORD STIMULATOR BATTERY EXCHANGE N/A 02/08/2016   Procedure: Lumbar spinal cord stimulator implantable pulse generator replacement;  Surgeon: Clydell Hakim, MD;  Location: [place] [facility];  Service: Neurosurgery;  Laterality: N/A;  . SPINAL CORD STIMULATOR IMPLANT    . TRANSTHORACIC ECHOCARDIOGRAM  02/20/2015   [facility]: Mild concentric LVH. EF 55-60%. Normal regional wall motion. Mild to moderate TR with no significant pulmonary hypertension.   Family History:  Family History  Problem Relation Age of Onset  . Heart disease Mother   . Hypertension Mother   . Sudden death Mother        Presumably cardiac  . Hyperlipidemia Father   . Heart attack Father 37       At least 5 MIs. Had CABG.  . Heart failure Father   . Diabetes Father   . Hypertension Father   . Kidney disease Father   . Diabetes Brother   . Lung cancer Maternal Grandmother   . Diabetes Paternal Grandmother   . Sudden death Paternal Grandmother        Unclear etiology  . Diabetes Unknown   . Kidney disease Unknown   . Kidney disease Brother   . Hypertension Brother   . Sudden death Brother        Thought to be related to heart disease  . Throat cancer Brother   . Colon cancer Neg Hx   . Mental illness Neg Hx    Family Psychiatric  History: See H&P  Social History:  History  Alcohol Use No     History  Drug Use  . Types: Oxycodone, Cocaine    Comment: rarely- last 2015-    Social History   Social History  . Marital status: Married    Spouse name: N/A  . Number of children: 1  . Years of education: N/A   Occupational History  .  disabled    Social History Main Topics  . Smoking status: Former Smoker    Packs/day: 1.00    Years: 35.00    Types: Cigarettes  . Smokeless tobacco: Never Used     Comment: 2018  . Alcohol use No  . Drug use: Yes    Types: Oxycodone, Cocaine     Comment: rarely- last 2015-  . Sexual activity: Not Asked   Other Topics Concern  . None   Social History Narrative   He is a separated father of 1. His daughter who is in nursing school lives with him part-time.   He is currently disabled due to chronic back pain.    He did some college education.   He currently is trying to use electronic cigarettes but has smoked one pack a day for over 35 years.   He has recently stopped using prescription benzodiazepine and pain medications because he is in the process  of changing pain med doctors.   He does not routinely exercise secondary to back, neck pain as well as dyspnea.   Hospital Course: (Per admission): Shirley is a 73 y old CM,separated , on SSI , lives in [place], [place] , who has a hx of Bipolar disorder, chronic pain, hx of multiple surgeries ,DDD, fibromyalgia ,presented with worsening mood sx as well as command AH.   After the above admission assessment, Jeronimo's presented symptoms were noted. The medication management for the presenting symptoms discussed & initiated. He was medicated & discharged on; Elavil 100 mg for depression/insomnia, Buspar 15 mg for anxiety, Hydroxyzine 25 mg prn for anxiety, Lamictal 25 mg for mood stabilization, nicotine patch 14 mg for smoking cessation & olanzapine 15 mg for mood control. He was also enrolled in the group counseling  Sessions being offered & held on this unit. He participated & learned coping skills.   Besides the mood stabilization treatment, Derrill also received other medication regimen for the other pre-existing medical issues presented. He tolerated his treatment regimen with out adverse effects or reaction reported.  Dewon's symptoms responded  well this his treatment regimen. This is evidenced by his daily reports of symptom reduction. He is currently medically & mentally stable to be discharged to continue mental health care on an outpatient basis as noted below. Upon discharge, he adamantly denies any SIHI, AVH, delusional thoughts or paranoia. He [facility] with all belongings. Transportation per wife.  Physical Findings: AIMS: Facial and Oral Movements Muscles of Facial Expression: None, normal Lips and Perioral Area: None, normal Jaw: None, normal Tongue: None, normal,Extremity Movements Upper (arms, wrists, hands, fingers): None, normal Lower (legs, knees, ankles, toes): None, normal, Trunk Movements Neck, shoulders, hips: None, normal, Overall Severity Severity of abnormal movements (highest score from questions above): None, normal Incapacitation due to abnormal movements: None, normal Patient's awareness of abnormal movements (rate only patient's report): No Awareness, Dental Status Current problems with teeth and/or dentures?: No Does patient usually wear dentures?: No  CIWA:    COWS:     Musculoskeletal: Strength & Muscle Tone: within normal limits Gait & Station: normal Patient leans: N/A  Psychiatric Specialty Exam: Physical Exam  Nursing note and vitals reviewed. Constitutional: He is oriented to person, place, and time. He appears well-developed.  HENT:  Head: Normocephalic.  Eyes: Pupils are equal, round, and reactive to light.  Neck: Normal range of motion.  Cardiovascular: Normal rate.   Respiratory: Effort normal.  GI: Soft.  Genitourinary:  Genitourinary Comments: Deferred  Musculoskeletal: Normal range of motion.  Hx. Tibia fx.  Neurological: He is alert and oriented to person, place, and time.  Skin: Skin is warm and dry.    Review of Systems  Constitutional: Negative.   HENT: Negative.   Eyes: Negative.   Respiratory: Negative.   Cardiovascular: Negative.   Gastrointestinal: Negative.    Genitourinary: Negative.   Musculoskeletal: Negative.   Skin: Negative.   Neurological: Negative.   Endo/Heme/Allergies: Negative.   Psychiatric/Behavioral: Positive for depression (Stable ), hallucinations (Hx. Psychosis) and substance abuse (Hx. Benzodiazepine & THC use.). Negative for memory loss and suicidal ideas. The patient has insomnia (Stable). The patient is not nervous/anxious.     Blood pressure 121/82, pulse 67, temperature 98.3 F (36.8 C), temperature source Oral, resp. rate 16, height 6' (1.829 m), weight 99.8 kg (220 lb), SpO2 100 %.Body mass index is 29.84 kg/m.  See Md's suicide risk assessment.   Have you used any form of tobacco in  the last 30 days? (Cigarettes, Smokeless Tobacco, Cigars, and/or Pipes): Yes  Has this patient used any form of tobacco in the last 30 days? (Cigarettes, Smokeless Tobacco, Cigars, and/or Pipes) Yes: provided with a nicotine patch 14 mg prescription upon discharge for smoking cessation.  Blood Alcohol level:  Lab Results  Component Value Date   [facility] <5 10/26/2016   ETH <11 03/47/4259   Metabolic Disorder Labs:  Lab Results  Component Value Date   HGBA1C 4.6 (L) 10/29/2016   MPG 85 10/29/2016   MPG 103 12/26/2015   Lab Results  Component Value Date   PROLACTIN 27.7 (H) 10/29/2016   Lab Results  Component Value Date   CHOL 245 (H) 10/29/2016   TRIG 195 (H) 10/29/2016   HDL 47 10/29/2016   CHOLHDL 5.2 10/29/2016   VLDL 39 10/29/2016   LDLCALC 159 (H) 10/29/2016   LDLCALC 176 (H) 12/27/2015   See Psychiatric Specialty Exam and Suicide Risk Assessment completed by Attending Physician prior to discharge.  Discharge destination:  Home  Is patient on multiple antipsychotic therapies at discharge:  No   Has Patient had three or more failed trials of antipsychotic monotherapy by history:  No  Recommended Plan for Multiple Antipsychotic Therapies: NA   Allergies as of 10/30/2016      Reactions   Zolpidem Tartrate Other (See  Comments)   Hallucinations and sleep walks   Lyrica [pregabalin]    Demerol [meperidine] Itching, Rash, Hives      Medication List    STOP taking these medications   feeding supplement (NEPRO CARB STEADY) Liqd   multivitamin Tabs tablet   nitroGLYCERIN 0.4 MG SL tablet Commonly known as:  NITROSTAT   promethazine 25 MG tablet Commonly known as:  PHENERGAN   Vitamin D (Ergocalciferol) 50000 units Caps capsule Commonly known as:  DRISDOL     TAKE these medications     Indication  amitriptyline 10 MG tablet Commonly known as:  ELAVIL Take 2 tablets (20 mg total) by mouth at bedtime. For depression  Indication:  Depression with Anxiety   amLODipine 5 MG tablet Commonly known as:  NORVASC Take 1 tablet (5 mg total) by mouth at bedtime. For high blood pressure What changed:  additional instructions  Indication:  High Blood Pressure Disorder   amLODipine 5 MG tablet Commonly known as:  NORVASC Take 1 tablet (5 mg total) by mouth at bedtime. For high blood pressure What changed:  You were already taking a medication with the same name, and this prescription was added. Make sure you understand how and when to take each.  Indication:  High Blood Pressure Disorder   aspirin EC 81 MG tablet Take 1 tablet (81 mg total) by mouth daily. For hearth health What changed:  additional instructions  Indication:  Cerebrovascular Accident or Stroke   busPIRone 15 MG tablet Commonly known as:  BUSPAR Take 1 tablet (15 mg total) by mouth 2 (two) times daily. For anxiety What changed:  additional instructions  Indication:  Generalized Anxiety Disorder   esomeprazole 40 MG capsule Commonly known as:  NEXIUM Take 1 capsule (40 mg total) by mouth daily at 12 noon. For indigestion What changed:  additional instructions  Indication:  Gastroesophageal Reflux Disease   hydrOXYzine 25 MG tablet Commonly known as:  ATARAX/VISTARIL Take 1 tablet (25 mg total) by mouth every 8 (eight) hours  as needed for anxiety. For anxiety What changed:  reasons to take this  additional instructions  Indication:  Anxiety Neurosis  lamoTRIgine 25 MG tablet Commonly known as:  LAMICTAL Take 1 tablet (25 mgs) in the morning & 2 tablets (50 mgs) at bedtime: For mood stabilization What changed:  how much to take  how to take this  when to take this  additional instructions  Indication:  Manic-Depression   levothyroxine 75 MCG tablet Commonly known as:  SYNTHROID, LEVOTHROID Take 1 tablet (75 mcg total) by mouth daily before breakfast. For low thyroid What changed:  additional instructions  Indication:  Underactive Thyroid   metoprolol tartrate 50 MG tablet Commonly known as:  LOPRESSOR Take 1 tablet (50 mg total) by mouth 2 (two) times daily. For high blood pressure What changed:  additional instructions  Indication:  High Blood Pressure Disorder   nicotine 14 mg/24hr patch Commonly known as:  NICODERM CQ - dosed in mg/24 hours Place 1 patch (14 mg total) onto the skin daily. For smoking ceasation Start taking on:  10/31/2016  Indication:  Nicotine Addiction   OLANZapine 15 MG tablet Commonly known as:  ZYPREXA Take 1 tablet (15 mg total) by mouth at bedtime. For bipolar What changed:  medication strength  how much to take  additional instructions  Indication:  Manic-Depression, psychosis   Oxycodone HCl 10 MG Tabs Take 1 tablet (10 mg total) by mouth 4 (four) times daily. For pain What changed:  additional instructions  Indication:  Chronic Pain      [facility] Follow up on 11/27/2016.   Why:  at 2:15pm for medication management with Dr. Darleene Cleaver. Contact information: [address] [place] [address] [phone]        [facility], [facility] Follow up on 11/07/2016.   Specialty:  Behavioral Health Why:  at 12:00pm for therapy with Dr. Vita Barley. Please arrive at 11:30am to complete new patient  paperwork.  Contact information: [place] [address] [phone]          Follow-up recommendations: Activities as tolerated. Diet: As recommended by your primary care provider. Keep all scheduled follow-up appointment as recommended.   Comments: Patient has been instructed to call 911, the [facility] hot-line or go to the nearest ED in the event of worsening symptoms.  Signed: Vicenta Aly, NP, FNP-BC 10/30/2016, 10:46 AM

## 2016-10-31 DIAGNOSIS — F319 Bipolar disorder, unspecified: Secondary | ICD-10-CM | POA: Diagnosis not present

## 2016-11-03 DIAGNOSIS — F319 Bipolar disorder, unspecified: Secondary | ICD-10-CM | POA: Diagnosis not present

## 2016-11-05 DIAGNOSIS — F319 Bipolar disorder, unspecified: Secondary | ICD-10-CM | POA: Diagnosis not present

## 2016-11-07 DIAGNOSIS — F319 Bipolar disorder, unspecified: Secondary | ICD-10-CM | POA: Diagnosis not present

## 2016-11-10 DIAGNOSIS — F319 Bipolar disorder, unspecified: Secondary | ICD-10-CM | POA: Diagnosis not present

## 2016-11-12 DIAGNOSIS — F1120 Opioid dependence, uncomplicated: Secondary | ICD-10-CM | POA: Diagnosis not present

## 2016-11-12 DIAGNOSIS — M25551 Pain in right hip: Secondary | ICD-10-CM | POA: Diagnosis not present

## 2016-11-12 DIAGNOSIS — F319 Bipolar disorder, unspecified: Secondary | ICD-10-CM | POA: Diagnosis not present

## 2016-11-12 DIAGNOSIS — Z720 Tobacco use: Secondary | ICD-10-CM | POA: Diagnosis not present

## 2016-11-12 DIAGNOSIS — G894 Chronic pain syndrome: Secondary | ICD-10-CM | POA: Diagnosis not present

## 2016-11-12 DIAGNOSIS — F419 Anxiety disorder, unspecified: Secondary | ICD-10-CM | POA: Diagnosis not present

## 2016-11-12 DIAGNOSIS — M179 Osteoarthritis of knee, unspecified: Secondary | ICD-10-CM | POA: Diagnosis not present

## 2016-11-12 DIAGNOSIS — Z79891 Long term (current) use of opiate analgesic: Secondary | ICD-10-CM | POA: Diagnosis not present

## 2016-11-12 DIAGNOSIS — M5136 Other intervertebral disc degeneration, lumbar region: Secondary | ICD-10-CM | POA: Diagnosis not present

## 2016-11-12 DIAGNOSIS — M47816 Spondylosis without myelopathy or radiculopathy, lumbar region: Secondary | ICD-10-CM | POA: Diagnosis not present

## 2016-11-14 DIAGNOSIS — F319 Bipolar disorder, unspecified: Secondary | ICD-10-CM | POA: Diagnosis not present

## 2016-11-17 DIAGNOSIS — F319 Bipolar disorder, unspecified: Secondary | ICD-10-CM | POA: Diagnosis not present

## 2016-11-19 DIAGNOSIS — F319 Bipolar disorder, unspecified: Secondary | ICD-10-CM | POA: Diagnosis not present

## 2016-11-20 DIAGNOSIS — F3130 Bipolar disorder, current episode depressed, mild or moderate severity, unspecified: Secondary | ICD-10-CM | POA: Diagnosis not present

## 2016-11-20 DIAGNOSIS — F329 Major depressive disorder, single episode, unspecified: Secondary | ICD-10-CM | POA: Diagnosis not present

## 2016-11-20 DIAGNOSIS — F411 Generalized anxiety disorder: Secondary | ICD-10-CM | POA: Diagnosis not present

## 2016-11-20 DIAGNOSIS — F3132 Bipolar disorder, current episode depressed, moderate: Secondary | ICD-10-CM | POA: Diagnosis not present

## 2016-11-20 DIAGNOSIS — G4700 Insomnia, unspecified: Secondary | ICD-10-CM | POA: Diagnosis not present

## 2016-11-21 DIAGNOSIS — F319 Bipolar disorder, unspecified: Secondary | ICD-10-CM | POA: Diagnosis not present

## 2016-11-24 DIAGNOSIS — F319 Bipolar disorder, unspecified: Secondary | ICD-10-CM | POA: Diagnosis not present

## 2016-11-26 DIAGNOSIS — F319 Bipolar disorder, unspecified: Secondary | ICD-10-CM | POA: Diagnosis not present

## 2016-11-28 DIAGNOSIS — F319 Bipolar disorder, unspecified: Secondary | ICD-10-CM | POA: Diagnosis not present

## 2016-12-01 DIAGNOSIS — F319 Bipolar disorder, unspecified: Secondary | ICD-10-CM | POA: Diagnosis not present

## 2016-12-03 DIAGNOSIS — F319 Bipolar disorder, unspecified: Secondary | ICD-10-CM | POA: Diagnosis not present

## 2016-12-05 DIAGNOSIS — F319 Bipolar disorder, unspecified: Secondary | ICD-10-CM | POA: Diagnosis not present

## 2016-12-08 DIAGNOSIS — F319 Bipolar disorder, unspecified: Secondary | ICD-10-CM | POA: Diagnosis not present

## 2016-12-10 DIAGNOSIS — M25551 Pain in right hip: Secondary | ICD-10-CM | POA: Diagnosis not present

## 2016-12-10 DIAGNOSIS — M47816 Spondylosis without myelopathy or radiculopathy, lumbar region: Secondary | ICD-10-CM | POA: Diagnosis not present

## 2016-12-10 DIAGNOSIS — M5136 Other intervertebral disc degeneration, lumbar region: Secondary | ICD-10-CM | POA: Diagnosis not present

## 2016-12-10 DIAGNOSIS — M179 Osteoarthritis of knee, unspecified: Secondary | ICD-10-CM | POA: Diagnosis not present

## 2016-12-10 DIAGNOSIS — F419 Anxiety disorder, unspecified: Secondary | ICD-10-CM | POA: Diagnosis not present

## 2016-12-10 DIAGNOSIS — Z79891 Long term (current) use of opiate analgesic: Secondary | ICD-10-CM | POA: Diagnosis not present

## 2016-12-10 DIAGNOSIS — G894 Chronic pain syndrome: Secondary | ICD-10-CM | POA: Diagnosis not present

## 2016-12-10 DIAGNOSIS — Z720 Tobacco use: Secondary | ICD-10-CM | POA: Diagnosis not present

## 2016-12-10 DIAGNOSIS — F319 Bipolar disorder, unspecified: Secondary | ICD-10-CM | POA: Diagnosis not present

## 2016-12-10 DIAGNOSIS — F1120 Opioid dependence, uncomplicated: Secondary | ICD-10-CM | POA: Diagnosis not present

## 2016-12-12 DIAGNOSIS — F319 Bipolar disorder, unspecified: Secondary | ICD-10-CM | POA: Diagnosis not present

## 2016-12-15 DIAGNOSIS — F319 Bipolar disorder, unspecified: Secondary | ICD-10-CM | POA: Diagnosis not present

## 2016-12-17 DIAGNOSIS — F319 Bipolar disorder, unspecified: Secondary | ICD-10-CM | POA: Diagnosis not present

## 2016-12-19 DIAGNOSIS — E782 Mixed hyperlipidemia: Secondary | ICD-10-CM | POA: Diagnosis not present

## 2016-12-19 DIAGNOSIS — R079 Chest pain, unspecified: Secondary | ICD-10-CM | POA: Diagnosis not present

## 2016-12-19 DIAGNOSIS — R739 Hyperglycemia, unspecified: Secondary | ICD-10-CM | POA: Diagnosis not present

## 2016-12-19 DIAGNOSIS — Z79899 Other long term (current) drug therapy: Secondary | ICD-10-CM | POA: Diagnosis not present

## 2016-12-19 DIAGNOSIS — F319 Bipolar disorder, unspecified: Secondary | ICD-10-CM | POA: Diagnosis not present

## 2016-12-22 DIAGNOSIS — F319 Bipolar disorder, unspecified: Secondary | ICD-10-CM | POA: Diagnosis not present

## 2016-12-24 DIAGNOSIS — F319 Bipolar disorder, unspecified: Secondary | ICD-10-CM | POA: Diagnosis not present

## 2016-12-26 DIAGNOSIS — F319 Bipolar disorder, unspecified: Secondary | ICD-10-CM | POA: Diagnosis not present

## 2016-12-29 DIAGNOSIS — F319 Bipolar disorder, unspecified: Secondary | ICD-10-CM | POA: Diagnosis not present

## 2016-12-31 DIAGNOSIS — F319 Bipolar disorder, unspecified: Secondary | ICD-10-CM | POA: Diagnosis not present

## 2017-01-02 DIAGNOSIS — F319 Bipolar disorder, unspecified: Secondary | ICD-10-CM | POA: Diagnosis not present

## 2017-01-05 DIAGNOSIS — F319 Bipolar disorder, unspecified: Secondary | ICD-10-CM | POA: Diagnosis not present

## 2017-01-07 DIAGNOSIS — F319 Bipolar disorder, unspecified: Secondary | ICD-10-CM | POA: Diagnosis not present

## 2017-01-09 DIAGNOSIS — F319 Bipolar disorder, unspecified: Secondary | ICD-10-CM | POA: Diagnosis not present

## 2017-01-12 DIAGNOSIS — M5136 Other intervertebral disc degeneration, lumbar region: Secondary | ICD-10-CM | POA: Diagnosis not present

## 2017-01-12 DIAGNOSIS — M47816 Spondylosis without myelopathy or radiculopathy, lumbar region: Secondary | ICD-10-CM | POA: Diagnosis not present

## 2017-01-12 DIAGNOSIS — F319 Bipolar disorder, unspecified: Secondary | ICD-10-CM | POA: Diagnosis not present

## 2017-01-12 DIAGNOSIS — M179 Osteoarthritis of knee, unspecified: Secondary | ICD-10-CM | POA: Diagnosis not present

## 2017-01-12 DIAGNOSIS — F1120 Opioid dependence, uncomplicated: Secondary | ICD-10-CM | POA: Diagnosis not present

## 2017-01-12 DIAGNOSIS — G894 Chronic pain syndrome: Secondary | ICD-10-CM | POA: Diagnosis not present

## 2017-01-12 DIAGNOSIS — M25551 Pain in right hip: Secondary | ICD-10-CM | POA: Diagnosis not present

## 2017-01-12 DIAGNOSIS — F419 Anxiety disorder, unspecified: Secondary | ICD-10-CM | POA: Diagnosis not present

## 2017-01-12 DIAGNOSIS — Z79891 Long term (current) use of opiate analgesic: Secondary | ICD-10-CM | POA: Diagnosis not present

## 2017-01-12 DIAGNOSIS — Z720 Tobacco use: Secondary | ICD-10-CM | POA: Diagnosis not present

## 2017-01-13 DIAGNOSIS — G4700 Insomnia, unspecified: Secondary | ICD-10-CM | POA: Diagnosis not present

## 2017-01-13 DIAGNOSIS — E782 Mixed hyperlipidemia: Secondary | ICD-10-CM | POA: Diagnosis not present

## 2017-01-13 DIAGNOSIS — Z760 Encounter for issue of repeat prescription: Secondary | ICD-10-CM | POA: Diagnosis not present

## 2017-01-14 DIAGNOSIS — F319 Bipolar disorder, unspecified: Secondary | ICD-10-CM | POA: Diagnosis not present

## 2017-01-15 DIAGNOSIS — F411 Generalized anxiety disorder: Secondary | ICD-10-CM | POA: Diagnosis not present

## 2017-01-15 DIAGNOSIS — F3132 Bipolar disorder, current episode depressed, moderate: Secondary | ICD-10-CM | POA: Diagnosis not present

## 2017-01-16 DIAGNOSIS — F319 Bipolar disorder, unspecified: Secondary | ICD-10-CM | POA: Diagnosis not present

## 2017-01-19 DIAGNOSIS — F319 Bipolar disorder, unspecified: Secondary | ICD-10-CM | POA: Diagnosis not present

## 2017-01-21 DIAGNOSIS — F319 Bipolar disorder, unspecified: Secondary | ICD-10-CM | POA: Diagnosis not present

## 2017-01-23 DIAGNOSIS — F319 Bipolar disorder, unspecified: Secondary | ICD-10-CM | POA: Diagnosis not present

## 2017-01-26 DIAGNOSIS — F319 Bipolar disorder, unspecified: Secondary | ICD-10-CM | POA: Diagnosis not present

## 2017-01-28 DIAGNOSIS — F319 Bipolar disorder, unspecified: Secondary | ICD-10-CM | POA: Diagnosis not present

## 2017-01-30 DIAGNOSIS — F319 Bipolar disorder, unspecified: Secondary | ICD-10-CM | POA: Diagnosis not present

## 2017-02-02 DIAGNOSIS — F319 Bipolar disorder, unspecified: Secondary | ICD-10-CM | POA: Diagnosis not present

## 2017-02-04 DIAGNOSIS — F319 Bipolar disorder, unspecified: Secondary | ICD-10-CM | POA: Diagnosis not present

## 2017-02-09 DIAGNOSIS — F419 Anxiety disorder, unspecified: Secondary | ICD-10-CM | POA: Diagnosis not present

## 2017-02-09 DIAGNOSIS — M179 Osteoarthritis of knee, unspecified: Secondary | ICD-10-CM | POA: Diagnosis not present

## 2017-02-09 DIAGNOSIS — Z720 Tobacco use: Secondary | ICD-10-CM | POA: Diagnosis not present

## 2017-02-09 DIAGNOSIS — Z79891 Long term (current) use of opiate analgesic: Secondary | ICD-10-CM | POA: Diagnosis not present

## 2017-02-09 DIAGNOSIS — M5136 Other intervertebral disc degeneration, lumbar region: Secondary | ICD-10-CM | POA: Diagnosis not present

## 2017-02-09 DIAGNOSIS — G894 Chronic pain syndrome: Secondary | ICD-10-CM | POA: Diagnosis not present

## 2017-02-09 DIAGNOSIS — M25551 Pain in right hip: Secondary | ICD-10-CM | POA: Diagnosis not present

## 2017-02-09 DIAGNOSIS — F319 Bipolar disorder, unspecified: Secondary | ICD-10-CM | POA: Diagnosis not present

## 2017-02-09 DIAGNOSIS — F1120 Opioid dependence, uncomplicated: Secondary | ICD-10-CM | POA: Diagnosis not present

## 2017-02-09 DIAGNOSIS — M47816 Spondylosis without myelopathy or radiculopathy, lumbar region: Secondary | ICD-10-CM | POA: Diagnosis not present

## 2017-02-11 DIAGNOSIS — F319 Bipolar disorder, unspecified: Secondary | ICD-10-CM | POA: Diagnosis not present

## 2017-02-13 DIAGNOSIS — F319 Bipolar disorder, unspecified: Secondary | ICD-10-CM | POA: Diagnosis not present

## 2017-02-16 DIAGNOSIS — F319 Bipolar disorder, unspecified: Secondary | ICD-10-CM | POA: Diagnosis not present

## 2017-02-18 DIAGNOSIS — F319 Bipolar disorder, unspecified: Secondary | ICD-10-CM | POA: Diagnosis not present

## 2017-02-20 DIAGNOSIS — F319 Bipolar disorder, unspecified: Secondary | ICD-10-CM | POA: Diagnosis not present

## 2017-02-23 DIAGNOSIS — F319 Bipolar disorder, unspecified: Secondary | ICD-10-CM | POA: Diagnosis not present

## 2017-03-09 DIAGNOSIS — G894 Chronic pain syndrome: Secondary | ICD-10-CM | POA: Diagnosis not present

## 2017-03-09 DIAGNOSIS — F419 Anxiety disorder, unspecified: Secondary | ICD-10-CM | POA: Diagnosis not present

## 2017-03-09 DIAGNOSIS — M47816 Spondylosis without myelopathy or radiculopathy, lumbar region: Secondary | ICD-10-CM | POA: Diagnosis not present

## 2017-03-09 DIAGNOSIS — M5136 Other intervertebral disc degeneration, lumbar region: Secondary | ICD-10-CM | POA: Diagnosis not present

## 2017-03-09 DIAGNOSIS — Z720 Tobacco use: Secondary | ICD-10-CM | POA: Diagnosis not present

## 2017-03-09 DIAGNOSIS — F1120 Opioid dependence, uncomplicated: Secondary | ICD-10-CM | POA: Diagnosis not present

## 2017-03-09 DIAGNOSIS — M179 Osteoarthritis of knee, unspecified: Secondary | ICD-10-CM | POA: Diagnosis not present

## 2017-03-09 DIAGNOSIS — Z79891 Long term (current) use of opiate analgesic: Secondary | ICD-10-CM | POA: Diagnosis not present

## 2017-03-09 DIAGNOSIS — M25551 Pain in right hip: Secondary | ICD-10-CM | POA: Diagnosis not present

## 2017-03-12 DIAGNOSIS — F3132 Bipolar disorder, current episode depressed, moderate: Secondary | ICD-10-CM | POA: Diagnosis not present

## 2017-03-12 DIAGNOSIS — F411 Generalized anxiety disorder: Secondary | ICD-10-CM | POA: Diagnosis not present

## 2017-04-09 DIAGNOSIS — M5136 Other intervertebral disc degeneration, lumbar region: Secondary | ICD-10-CM | POA: Diagnosis not present

## 2017-04-09 DIAGNOSIS — G894 Chronic pain syndrome: Secondary | ICD-10-CM | POA: Diagnosis not present

## 2017-04-09 DIAGNOSIS — Z79891 Long term (current) use of opiate analgesic: Secondary | ICD-10-CM | POA: Diagnosis not present

## 2017-04-09 DIAGNOSIS — F419 Anxiety disorder, unspecified: Secondary | ICD-10-CM | POA: Diagnosis not present

## 2017-04-09 DIAGNOSIS — Z720 Tobacco use: Secondary | ICD-10-CM | POA: Diagnosis not present

## 2017-04-09 DIAGNOSIS — M179 Osteoarthritis of knee, unspecified: Secondary | ICD-10-CM | POA: Diagnosis not present

## 2017-04-09 DIAGNOSIS — M25551 Pain in right hip: Secondary | ICD-10-CM | POA: Diagnosis not present

## 2017-04-09 DIAGNOSIS — M47816 Spondylosis without myelopathy or radiculopathy, lumbar region: Secondary | ICD-10-CM | POA: Diagnosis not present

## 2017-04-09 DIAGNOSIS — F1120 Opioid dependence, uncomplicated: Secondary | ICD-10-CM | POA: Diagnosis not present

## 2017-04-16 DIAGNOSIS — E039 Hypothyroidism, unspecified: Secondary | ICD-10-CM | POA: Diagnosis not present

## 2017-04-16 DIAGNOSIS — Z114 Encounter for screening for human immunodeficiency virus [HIV]: Secondary | ICD-10-CM | POA: Diagnosis not present

## 2017-04-16 DIAGNOSIS — R0602 Shortness of breath: Secondary | ICD-10-CM | POA: Diagnosis not present

## 2017-04-16 DIAGNOSIS — Z0000 Encounter for general adult medical examination without abnormal findings: Secondary | ICD-10-CM | POA: Diagnosis not present

## 2017-04-16 DIAGNOSIS — Z79899 Other long term (current) drug therapy: Secondary | ICD-10-CM | POA: Diagnosis not present

## 2017-04-16 DIAGNOSIS — Z23 Encounter for immunization: Secondary | ICD-10-CM | POA: Diagnosis not present

## 2017-05-05 DIAGNOSIS — F3132 Bipolar disorder, current episode depressed, moderate: Secondary | ICD-10-CM | POA: Diagnosis not present

## 2017-05-05 DIAGNOSIS — F411 Generalized anxiety disorder: Secondary | ICD-10-CM | POA: Diagnosis not present

## 2017-05-08 DIAGNOSIS — M47816 Spondylosis without myelopathy or radiculopathy, lumbar region: Secondary | ICD-10-CM | POA: Diagnosis not present

## 2017-05-08 DIAGNOSIS — F1120 Opioid dependence, uncomplicated: Secondary | ICD-10-CM | POA: Diagnosis not present

## 2017-05-08 DIAGNOSIS — Z79891 Long term (current) use of opiate analgesic: Secondary | ICD-10-CM | POA: Diagnosis not present

## 2017-05-08 DIAGNOSIS — F419 Anxiety disorder, unspecified: Secondary | ICD-10-CM | POA: Diagnosis not present

## 2017-05-08 DIAGNOSIS — M5136 Other intervertebral disc degeneration, lumbar region: Secondary | ICD-10-CM | POA: Diagnosis not present

## 2017-05-08 DIAGNOSIS — M25551 Pain in right hip: Secondary | ICD-10-CM | POA: Diagnosis not present

## 2017-05-08 DIAGNOSIS — G894 Chronic pain syndrome: Secondary | ICD-10-CM | POA: Diagnosis not present

## 2017-05-08 DIAGNOSIS — Q761 Klippel-Feil syndrome: Secondary | ICD-10-CM | POA: Diagnosis not present

## 2017-05-08 DIAGNOSIS — Z720 Tobacco use: Secondary | ICD-10-CM | POA: Diagnosis not present

## 2017-05-18 DIAGNOSIS — E039 Hypothyroidism, unspecified: Secondary | ICD-10-CM | POA: Diagnosis not present

## 2017-05-18 DIAGNOSIS — I10 Essential (primary) hypertension: Secondary | ICD-10-CM | POA: Diagnosis not present

## 2017-05-18 DIAGNOSIS — R0602 Shortness of breath: Secondary | ICD-10-CM | POA: Diagnosis not present

## 2017-05-18 DIAGNOSIS — I340 Nonrheumatic mitral (valve) insufficiency: Secondary | ICD-10-CM | POA: Diagnosis not present

## 2017-05-18 DIAGNOSIS — E782 Mixed hyperlipidemia: Secondary | ICD-10-CM | POA: Diagnosis not present

## 2017-06-09 DIAGNOSIS — Q761 Klippel-Feil syndrome: Secondary | ICD-10-CM | POA: Diagnosis not present

## 2017-06-09 DIAGNOSIS — F419 Anxiety disorder, unspecified: Secondary | ICD-10-CM | POA: Diagnosis not present

## 2017-06-09 DIAGNOSIS — M5136 Other intervertebral disc degeneration, lumbar region: Secondary | ICD-10-CM | POA: Diagnosis not present

## 2017-06-09 DIAGNOSIS — G894 Chronic pain syndrome: Secondary | ICD-10-CM | POA: Diagnosis not present

## 2017-06-09 DIAGNOSIS — M25551 Pain in right hip: Secondary | ICD-10-CM | POA: Diagnosis not present

## 2017-06-09 DIAGNOSIS — M47816 Spondylosis without myelopathy or radiculopathy, lumbar region: Secondary | ICD-10-CM | POA: Diagnosis not present

## 2017-06-09 DIAGNOSIS — F1120 Opioid dependence, uncomplicated: Secondary | ICD-10-CM | POA: Diagnosis not present

## 2017-06-09 DIAGNOSIS — Z79891 Long term (current) use of opiate analgesic: Secondary | ICD-10-CM | POA: Diagnosis not present

## 2017-06-09 DIAGNOSIS — Z720 Tobacco use: Secondary | ICD-10-CM | POA: Diagnosis not present

## 2017-07-06 DIAGNOSIS — F411 Generalized anxiety disorder: Secondary | ICD-10-CM | POA: Diagnosis not present

## 2017-07-06 DIAGNOSIS — F3132 Bipolar disorder, current episode depressed, moderate: Secondary | ICD-10-CM | POA: Diagnosis not present

## 2017-07-13 DIAGNOSIS — M5136 Other intervertebral disc degeneration, lumbar region: Secondary | ICD-10-CM | POA: Diagnosis not present

## 2017-07-13 DIAGNOSIS — M5412 Radiculopathy, cervical region: Secondary | ICD-10-CM | POA: Diagnosis not present

## 2017-07-13 DIAGNOSIS — Q761 Klippel-Feil syndrome: Secondary | ICD-10-CM | POA: Diagnosis not present

## 2017-07-13 DIAGNOSIS — M25551 Pain in right hip: Secondary | ICD-10-CM | POA: Diagnosis not present

## 2017-07-13 DIAGNOSIS — M47816 Spondylosis without myelopathy or radiculopathy, lumbar region: Secondary | ICD-10-CM | POA: Diagnosis not present

## 2017-07-13 DIAGNOSIS — Z79891 Long term (current) use of opiate analgesic: Secondary | ICD-10-CM | POA: Diagnosis not present

## 2017-07-13 DIAGNOSIS — F1120 Opioid dependence, uncomplicated: Secondary | ICD-10-CM | POA: Diagnosis not present

## 2017-07-13 DIAGNOSIS — G894 Chronic pain syndrome: Secondary | ICD-10-CM | POA: Diagnosis not present

## 2017-07-13 DIAGNOSIS — F419 Anxiety disorder, unspecified: Secondary | ICD-10-CM | POA: Diagnosis not present

## 2017-08-06 DIAGNOSIS — F419 Anxiety disorder, unspecified: Secondary | ICD-10-CM | POA: Diagnosis not present

## 2017-08-06 DIAGNOSIS — M47816 Spondylosis without myelopathy or radiculopathy, lumbar region: Secondary | ICD-10-CM | POA: Diagnosis not present

## 2017-08-06 DIAGNOSIS — Q761 Klippel-Feil syndrome: Secondary | ICD-10-CM | POA: Diagnosis not present

## 2017-08-06 DIAGNOSIS — M5412 Radiculopathy, cervical region: Secondary | ICD-10-CM | POA: Diagnosis not present

## 2017-08-06 DIAGNOSIS — M5136 Other intervertebral disc degeneration, lumbar region: Secondary | ICD-10-CM | POA: Diagnosis not present

## 2017-08-06 DIAGNOSIS — G894 Chronic pain syndrome: Secondary | ICD-10-CM | POA: Diagnosis not present

## 2017-08-06 DIAGNOSIS — F1120 Opioid dependence, uncomplicated: Secondary | ICD-10-CM | POA: Diagnosis not present

## 2017-08-06 DIAGNOSIS — Z79891 Long term (current) use of opiate analgesic: Secondary | ICD-10-CM | POA: Diagnosis not present

## 2017-08-13 DIAGNOSIS — Z6829 Body mass index (BMI) 29.0-29.9, adult: Secondary | ICD-10-CM | POA: Diagnosis not present

## 2017-08-13 DIAGNOSIS — M5412 Radiculopathy, cervical region: Secondary | ICD-10-CM | POA: Diagnosis not present

## 2017-08-13 DIAGNOSIS — I10 Essential (primary) hypertension: Secondary | ICD-10-CM | POA: Diagnosis not present

## 2017-08-13 DIAGNOSIS — M5416 Radiculopathy, lumbar region: Secondary | ICD-10-CM | POA: Diagnosis not present

## 2017-08-17 DIAGNOSIS — G4700 Insomnia, unspecified: Secondary | ICD-10-CM | POA: Diagnosis not present

## 2017-08-17 DIAGNOSIS — I10 Essential (primary) hypertension: Secondary | ICD-10-CM | POA: Diagnosis not present

## 2017-08-17 DIAGNOSIS — E782 Mixed hyperlipidemia: Secondary | ICD-10-CM | POA: Diagnosis not present

## 2017-08-17 DIAGNOSIS — Z79899 Other long term (current) drug therapy: Secondary | ICD-10-CM | POA: Diagnosis not present

## 2017-08-17 DIAGNOSIS — R739 Hyperglycemia, unspecified: Secondary | ICD-10-CM | POA: Diagnosis not present

## 2017-08-20 ENCOUNTER — Other Ambulatory Visit: Payer: Self-pay | Admitting: Student

## 2017-08-20 DIAGNOSIS — M5412 Radiculopathy, cervical region: Secondary | ICD-10-CM

## 2017-08-20 DIAGNOSIS — M5416 Radiculopathy, lumbar region: Secondary | ICD-10-CM

## 2017-08-21 DIAGNOSIS — S161XXA Strain of muscle, fascia and tendon at neck level, initial encounter: Secondary | ICD-10-CM | POA: Diagnosis not present

## 2017-08-21 DIAGNOSIS — S335XXA Sprain of ligaments of lumbar spine, initial encounter: Secondary | ICD-10-CM | POA: Diagnosis not present

## 2017-08-31 ENCOUNTER — Other Ambulatory Visit: Payer: Self-pay | Admitting: Student

## 2017-08-31 ENCOUNTER — Ambulatory Visit
Admission: RE | Admit: 2017-08-31 | Discharge: 2017-08-31 | Disposition: A | Discharge status: Home / Self Care | Payer: 59 | Source: Ambulatory Visit | Attending: Student | Admitting: Student

## 2017-08-31 DIAGNOSIS — M5416 Radiculopathy, lumbar region: Secondary | ICD-10-CM

## 2017-08-31 DIAGNOSIS — M4982 Spondylopathy in diseases classified elsewhere, cervical region: Secondary | ICD-10-CM | POA: Diagnosis not present

## 2017-08-31 DIAGNOSIS — M5412 Radiculopathy, cervical region: Secondary | ICD-10-CM

## 2017-08-31 DIAGNOSIS — M542 Cervicalgia: Secondary | ICD-10-CM | POA: Diagnosis not present

## 2017-08-31 DIAGNOSIS — M5116 Intervertebral disc disorders with radiculopathy, lumbar region: Secondary | ICD-10-CM | POA: Diagnosis not present

## 2017-08-31 DIAGNOSIS — M5117 Intervertebral disc disorders with radiculopathy, lumbosacral region: Secondary | ICD-10-CM | POA: Diagnosis not present

## 2017-08-31 MED ORDER — ONDANSETRON HCL 4 MG/2ML IJ SOLN
4.0000 mg | Freq: Once | INTRAMUSCULAR | Status: AC
  Administered 2017-08-31: 4 mg via INTRAMUSCULAR

## 2017-08-31 MED ORDER — IOPAMIDOL (ISOVUE-M 300) INJECTION 61%
10.0000 mL | Freq: Once | INTRAMUSCULAR | Status: AC | PRN
  Administered 2017-08-31: 10 mL via INTRATHECAL

## 2017-08-31 MED ORDER — DIAZEPAM 5 MG PO TABS
10.0000 mg | ORAL_TABLET | Freq: Once | ORAL | Status: AC
  Administered 2017-08-31: 10 mg via ORAL

## 2017-08-31 MED ORDER — HYDROMORPHONE HCL 1 MG/ML IJ SOLN
1.0000 mg | Freq: Once | INTRAMUSCULAR | Status: AC
  Administered 2017-08-31: 1 mg via INTRAMUSCULAR

## 2017-08-31 NOTE — Discharge Instructions (Signed)
Myelogram Discharge Instructions  1. Go home and rest quietly for the next 24 hours.  It is important to lie flat for the next 24 hours.  Get up only to go to the restroom.  You may lie in the bed or on a couch on your back, your stomach, your left side or your right side.  You may have one pillow under your head.  You may have pillows between your knees while you are on your side or under your knees while you are on your back.  2. DO NOT drive today.  Recline the seat as far back as it will go, while still wearing your seat belt, on the way home.  3. You may get up to go to the bathroom as needed.  You may sit up for 10 minutes to eat.  You may resume your normal diet and medications unless otherwise indicated.  Drink lots of extra fluids today and tomorrow.  4. The incidence of headache, nausea, or vomiting is about 5% (one in 20 patients).  If you develop a headache, lie flat and drink plenty of fluids until the headache goes away.  Caffeinated beverages may be helpful.  If you develop severe nausea and vomiting or a headache that does not go away with flat bed rest, call [phone].  5. You may resume normal activities after your 24 hours of bed rest is over; however, do not exert yourself strongly or do any heavy lifting tomorrow. If when you get up you have a headache when standing, go back to bed and force fluids for another 24 hours.  6. Call your physician for a follow-up appointment.  The results of your myelogram will be sent directly to your physician by the following day.  7. If you have any questions or if complications develop after you arrive home, please call [phone].  Discharge instructions have been explained to the patient.  The patient, or the person responsible for the patient, fully understands these instructions.  YOU MAY RESTART YOUR ZYPREXA, ELAVIL, BUSPAR AND PHENERGAN TOMORROW 09/01/2017 AT 9:30AM.

## 2017-09-01 DIAGNOSIS — I10 Essential (primary) hypertension: Secondary | ICD-10-CM | POA: Diagnosis not present

## 2017-09-01 DIAGNOSIS — E782 Mixed hyperlipidemia: Secondary | ICD-10-CM | POA: Diagnosis not present

## 2017-09-01 DIAGNOSIS — G4700 Insomnia, unspecified: Secondary | ICD-10-CM | POA: Diagnosis not present

## 2017-09-02 DIAGNOSIS — F3132 Bipolar disorder, current episode depressed, moderate: Secondary | ICD-10-CM | POA: Diagnosis not present

## 2017-09-02 DIAGNOSIS — F411 Generalized anxiety disorder: Secondary | ICD-10-CM | POA: Diagnosis not present

## 2017-09-03 ENCOUNTER — Other Ambulatory Visit: Payer: Self-pay | Admitting: Neurosurgery

## 2017-09-03 DIAGNOSIS — M5412 Radiculopathy, cervical region: Secondary | ICD-10-CM | POA: Diagnosis not present

## 2017-09-04 DIAGNOSIS — M47816 Spondylosis without myelopathy or radiculopathy, lumbar region: Secondary | ICD-10-CM | POA: Diagnosis not present

## 2017-09-04 DIAGNOSIS — M5412 Radiculopathy, cervical region: Secondary | ICD-10-CM | POA: Diagnosis not present

## 2017-09-04 DIAGNOSIS — Z79891 Long term (current) use of opiate analgesic: Secondary | ICD-10-CM | POA: Diagnosis not present

## 2017-09-04 DIAGNOSIS — F1120 Opioid dependence, uncomplicated: Secondary | ICD-10-CM | POA: Diagnosis not present

## 2017-09-04 DIAGNOSIS — M5136 Other intervertebral disc degeneration, lumbar region: Secondary | ICD-10-CM | POA: Diagnosis not present

## 2017-09-04 DIAGNOSIS — G894 Chronic pain syndrome: Secondary | ICD-10-CM | POA: Diagnosis not present

## 2017-09-04 DIAGNOSIS — F419 Anxiety disorder, unspecified: Secondary | ICD-10-CM | POA: Diagnosis not present

## 2017-09-04 DIAGNOSIS — Q761 Klippel-Feil syndrome: Secondary | ICD-10-CM | POA: Diagnosis not present

## 2017-09-11 ENCOUNTER — Encounter (HOSPITAL_COMMUNITY): Payer: Self-pay

## 2017-09-11 NOTE — Pre-Procedure Instructions (Signed)
[name]  09/11/2017    Your procedure is scheduled on Monday, September 21, 2017 at 10:15 AM.   Report to [facility] Entrance "A" Admitting Office at 8:15 AM.   Call this number if you have problems the morning of surgery: [phone]   Questions prior to day of surgery, please call [phone] between 8 & 4 PM.   Remember:  Do not eat food or drink liquids after midnight Sunday, 09/20/17.  Take these medicines the morning of surgery with A SIP OF WATER: Bupropion (Wellbutrin), Buspirone (Buspar), Lamotrigine (Lamictal), Levothyroxine (Synthroid), Oxycodone  Stop Aspirin only if instructed to do so by surgeon. Stop Fish Oil 7 days prior to surgery. Do not use Aspirin Products or NSAIDS (Ibuprofen, Aleve, etc) 7 days prior to surgery.   Do not wear jewelry.  Do not wear lotions, powders, cologne or deodorant.  Men may shave face and neck.  Do not bring valuables to the hospital.  [facility] is not responsible for any belongings or valuables.  Contacts, dentures or bridgework may not be worn into surgery.  Leave your suitcase in the car.  After surgery it may be brought to your room.  For patients admitted to the hospital, discharge time will be determined by your treatment team.  Patients discharged the day of surgery will not be allowed to drive home.    - Preparing for Surgery  Before surgery, you can play an important role.  Because skin is not sterile, your skin needs to be as free of germs as possible.  You can reduce the number of germs on you skin by washing with CHG (chlorahexidine gluconate) soap before surgery.  CHG is an antiseptic cleaner which kills germs and bonds with the skin to continue killing germs even after washing.  Please DO NOT use if you have an allergy to CHG or antibacterial soaps.  If your skin becomes reddened/irritated stop using the CHG and inform your nurse when you arrive at Short Stay.  Do not shave (including legs and  underarms) for at least 48 hours prior to the first CHG shower.  You may shave your face.  Please follow these instructions carefully:   1.  Shower with CHG Soap the night before surgery and the                    morning of Surgery.  2.  If you choose to wash your hair, wash your hair first as usual with your       normal shampoo.  3.  After you shampoo, rinse your hair and body thoroughly to remove the shampoo.  4.  Use CHG as you would any other liquid soap.  You can apply chg directly       to the skin and wash gently with scrungie or a clean washcloth.  5.  Apply the CHG Soap to your body ONLY FROM THE NECK DOWN.        Do not use on open wounds or open sores.  Avoid contact with your eyes, ears, mouth and genitals (private parts).  Wash genitals (private parts) with your normal soap.  6.  Wash thoroughly, paying special attention to the area where your surgery        will be performed.  7.  Thoroughly rinse your body with warm water from the neck down.  8.  DO NOT shower/wash with your normal soap after using and rinsing off  the CHG Soap.  9.  Pat yourself dry with a clean towel.            10.  Wear clean pajamas.            11.  Place clean sheets on your bed the night of your first shower and do not        sleep with pets.  Day of Surgery  Do not apply any lotions/deodorants the morning of surgery.  Please wear clean clothes to the hospital.   Please read over the fact sheets that you were given.

## 2017-09-14 ENCOUNTER — Encounter (HOSPITAL_COMMUNITY): Payer: Self-pay

## 2017-09-14 ENCOUNTER — Encounter (HOSPITAL_COMMUNITY)
Admission: RE | Admit: 2017-09-14 | Discharge: 2017-09-14 | Disposition: A | Discharge status: Home / Self Care | Payer: 59 | Source: Ambulatory Visit | Attending: Neurosurgery | Admitting: Neurosurgery

## 2017-09-14 ENCOUNTER — Other Ambulatory Visit (HOSPITAL_COMMUNITY): Payer: Self-pay | Admitting: *Deleted

## 2017-09-14 ENCOUNTER — Other Ambulatory Visit: Payer: Self-pay

## 2017-09-14 DIAGNOSIS — Z01812 Encounter for preprocedural laboratory examination: Secondary | ICD-10-CM | POA: Insufficient documentation

## 2017-09-14 HISTORY — DX: Tremor, unspecified: R25.1

## 2017-09-14 HISTORY — DX: Pneumonia, unspecified organism: J18.9

## 2017-09-14 LAB — CBC
HEMATOCRIT: 40.4 % (ref 39.0–52.0)
HEMOGLOBIN: 13.8 g/dL (ref 13.0–17.0)
MCH: 29.7 pg (ref 26.0–34.0)
MCHC: 34.2 g/dL (ref 30.0–36.0)
MCV: 87.1 fL (ref 78.0–100.0)
Platelets: 307 10*3/uL (ref 150–400)
RBC: 4.64 MIL/uL (ref 4.22–5.81)
RDW: 12.9 % (ref 11.5–15.5)
WBC: 10.3 10*3/uL (ref 4.0–10.5)

## 2017-09-14 LAB — COMPREHENSIVE METABOLIC PANEL
ALK PHOS: 80 U/L (ref 38–126)
ALT: 47 U/L (ref 17–63)
AST: 39 U/L (ref 15–41)
Albumin: 3.9 g/dL (ref 3.5–5.0)
Anion gap: 10 (ref 5–15)
BUN: 18 mg/dL (ref 6–20)
CHLORIDE: 107 mmol/L (ref 101–111)
CO2: 20 mmol/L — ABNORMAL LOW (ref 22–32)
CREATININE: 1.13 mg/dL (ref 0.61–1.24)
Calcium: 9.2 mg/dL (ref 8.9–10.3)
GFR calc Af Amer: >60 mL/min (ref >60)
GFR calc non Af Amer: >60 mL/min (ref >60)
Glucose, Bld: 119 mg/dL — ABNORMAL HIGH (ref 65–99)
Potassium: 4.2 mmol/L (ref 3.5–5.1)
Sodium: 137 mmol/L (ref 135–145)
Total Bilirubin: 0.5 mg/dL (ref 0.3–1.2)
Total Protein: 7 g/dL (ref 6.5–8.1)

## 2017-09-14 LAB — SURGICAL PCR SCREEN
MRSA, PCR: NEGATIVE
Staphylococcus aureus: NEGATIVE

## 2017-09-14 NOTE — Progress Notes (Addendum)
Pt denies cardiac history, chest pain or sob. Heart rate was elevated at 110 on arrival. States he walked from the parking deck into hospital. Pt denies hypertension (listed on his medical history). States his blood pressure is usually low. Pt states he is not diabetic. Pt states he was instructed to stop Aspirin 7 days prior to surgery. Today was his last dose. Rechecked his pulse after appt and it was 68 and regular.

## 2017-09-20 NOTE — Anesthesia Preprocedure Evaluation (Addendum)
Anesthesia Evaluation    Airway Mallampati: II  TM Distance: >3 FB Neck ROM: Full    Dental  (+) Dental Advisory Given, Edentulous Upper, Edentulous Lower   Pulmonary former smoker,    breath sounds clear to auscultation       Cardiovascular METS: 5 - 7 Mets hypertension, Pt. 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 Normal EF   Neuro/Psych Anxiety Depression Bipolar Disorder negative neurological ROS     GI/Hepatic Neg liver ROS, GERD  ,  Endo/Other  Hypothyroidism   Renal/GU Renal disease     Musculoskeletal  (+) Arthritis , Fibromyalgia -  Abdominal   Peds  Hematology negative hematology ROS (+)   Anesthesia Other Findings   Reproductive/Obstetrics                            Lab Results  Component Value Date   WBC 10.3 09/14/2017   HGB 13.8 09/14/2017   HCT 40.4 09/14/2017   MCV 87.1 09/14/2017   PLT 307 09/14/2017   Lab Results  Component Value Date   CREATININE 1.13 09/14/2017   BUN 18 09/14/2017   NA 137 09/14/2017   K 4.2 09/14/2017   CL 107 09/14/2017   CO2 20 (L) 09/14/2017    Anesthesia Physical Anesthesia Plan  ASA: III  Anesthesia Plan: General   Post-op Pain Management:    Induction: Intravenous  PONV Risk Score and Plan: 2 and Ondansetron, Dexamethasone and Treatment may vary due to age or medical condition  Airway Management Planned: Oral ETT and Video Laryngoscope Planned  Additional Equipment:   Intra-op Plan:   Post-operative Plan: Extubation in OR  Informed Consent: I have reviewed the patients History and Physical, chart, labs and discussed the procedure including the risks, benefits and alternatives for the proposed anesthesia with the patient or authorized representative who has indicated his/her understanding and acceptance.   Dental advisory given  Plan Discussed with: CRNA  Anesthesia Plan  Comments:        Anesthesia Quick Evaluation

## 2017-09-21 ENCOUNTER — Other Ambulatory Visit: Payer: Self-pay

## 2017-09-21 ENCOUNTER — Encounter (HOSPITAL_COMMUNITY): Payer: Self-pay | Admitting: *Deleted

## 2017-09-21 ENCOUNTER — Ambulatory Visit (HOSPITAL_COMMUNITY): Payer: 59 | Admitting: Anesthesiology

## 2017-09-21 ENCOUNTER — Ambulatory Visit (HOSPITAL_COMMUNITY): Payer: 59

## 2017-09-21 ENCOUNTER — Ambulatory Visit (HOSPITAL_COMMUNITY)
Admission: RE | Admit: 2017-09-21 | Discharge: 2017-09-22 | Disposition: A | Discharge status: Home / Self Care | Payer: 59 | Source: Ambulatory Visit | Attending: Neurosurgery | Admitting: Neurosurgery

## 2017-09-21 ENCOUNTER — Ambulatory Visit (HOSPITAL_COMMUNITY)
Admission: RE | Disposition: A | Discharge status: Home / Self Care | Payer: Self-pay | Source: Ambulatory Visit | Attending: Neurosurgery

## 2017-09-21 DIAGNOSIS — Z79899 Other long term (current) drug therapy: Secondary | ICD-10-CM | POA: Insufficient documentation

## 2017-09-21 DIAGNOSIS — I700 Atherosclerosis of aorta: Secondary | ICD-10-CM | POA: Insufficient documentation

## 2017-09-21 DIAGNOSIS — M4322 Fusion of spine, cervical region: Secondary | ICD-10-CM | POA: Diagnosis not present

## 2017-09-21 DIAGNOSIS — M5116 Intervertebral disc disorders with radiculopathy, lumbar region: Secondary | ICD-10-CM | POA: Insufficient documentation

## 2017-09-21 DIAGNOSIS — R000 Tachycardia, unspecified: Secondary | ICD-10-CM | POA: Diagnosis not present

## 2017-09-21 DIAGNOSIS — I2510 Atherosclerotic heart disease of native coronary artery without angina pectoris: Secondary | ICD-10-CM | POA: Insufficient documentation

## 2017-09-21 DIAGNOSIS — R251 Tremor, unspecified: Secondary | ICD-10-CM | POA: Diagnosis not present

## 2017-09-21 DIAGNOSIS — M1990 Unspecified osteoarthritis, unspecified site: Secondary | ICD-10-CM | POA: Insufficient documentation

## 2017-09-21 DIAGNOSIS — M8580 Other specified disorders of bone density and structure, unspecified site: Secondary | ICD-10-CM | POA: Insufficient documentation

## 2017-09-21 DIAGNOSIS — F319 Bipolar disorder, unspecified: Secondary | ICD-10-CM | POA: Insufficient documentation

## 2017-09-21 DIAGNOSIS — Z8249 Family history of ischemic heart disease and other diseases of the circulatory system: Secondary | ICD-10-CM | POA: Diagnosis not present

## 2017-09-21 DIAGNOSIS — E785 Hyperlipidemia, unspecified: Secondary | ICD-10-CM | POA: Diagnosis not present

## 2017-09-21 DIAGNOSIS — Z981 Arthrodesis status: Secondary | ICD-10-CM | POA: Insufficient documentation

## 2017-09-21 DIAGNOSIS — Z885 Allergy status to narcotic agent status: Secondary | ICD-10-CM | POA: Diagnosis not present

## 2017-09-21 DIAGNOSIS — M47812 Spondylosis without myelopathy or radiculopathy, cervical region: Secondary | ICD-10-CM | POA: Diagnosis not present

## 2017-09-21 DIAGNOSIS — M4802 Spinal stenosis, cervical region: Secondary | ICD-10-CM | POA: Diagnosis not present

## 2017-09-21 DIAGNOSIS — K219 Gastro-esophageal reflux disease without esophagitis: Secondary | ICD-10-CM | POA: Insufficient documentation

## 2017-09-21 DIAGNOSIS — Z87891 Personal history of nicotine dependence: Secondary | ICD-10-CM | POA: Insufficient documentation

## 2017-09-21 DIAGNOSIS — F419 Anxiety disorder, unspecified: Secondary | ICD-10-CM | POA: Insufficient documentation

## 2017-09-21 DIAGNOSIS — I10 Essential (primary) hypertension: Secondary | ICD-10-CM | POA: Insufficient documentation

## 2017-09-21 DIAGNOSIS — E039 Hypothyroidism, unspecified: Secondary | ICD-10-CM | POA: Diagnosis not present

## 2017-09-21 DIAGNOSIS — Z7982 Long term (current) use of aspirin: Secondary | ICD-10-CM | POA: Diagnosis not present

## 2017-09-21 DIAGNOSIS — K580 Irritable bowel syndrome with diarrhea: Secondary | ICD-10-CM | POA: Insufficient documentation

## 2017-09-21 DIAGNOSIS — M797 Fibromyalgia: Secondary | ICD-10-CM | POA: Diagnosis not present

## 2017-09-21 DIAGNOSIS — M4722 Other spondylosis with radiculopathy, cervical region: Secondary | ICD-10-CM | POA: Diagnosis not present

## 2017-09-21 DIAGNOSIS — Z419 Encounter for procedure for purposes other than remedying health state, unspecified: Secondary | ICD-10-CM

## 2017-09-21 DIAGNOSIS — Z888 Allergy status to other drugs, medicaments and biological substances status: Secondary | ICD-10-CM | POA: Insufficient documentation

## 2017-09-21 HISTORY — DX: Spinal stenosis, cervical region: M48.02

## 2017-09-21 HISTORY — PX: ANTERIOR CERVICAL DECOMP/DISCECTOMY FUSION: SHX1161

## 2017-09-21 SURGERY — ANTERIOR CERVICAL DECOMPRESSION/DISCECTOMY FUSION 1 LEVEL
Anesthesia: General | Site: Spine Cervical

## 2017-09-21 MED ORDER — SODIUM CHLORIDE 0.9% FLUSH
3.0000 mL | Freq: Two times a day (BID) | INTRAVENOUS | Status: DC
  Administered 2017-09-21: 3 mL via INTRAVENOUS

## 2017-09-21 MED ORDER — LACTATED RINGERS IV SOLN
INTRAVENOUS | Status: DC | PRN
  Administered 2017-09-21 (×2): via INTRAVENOUS

## 2017-09-21 MED ORDER — CEFAZOLIN SODIUM-DEXTROSE 2-4 GM/100ML-% IV SOLN
2.0000 g | Freq: Three times a day (TID) | INTRAVENOUS | Status: AC
  Administered 2017-09-21 – 2017-09-22 (×2): 2 g via INTRAVENOUS
  Filled 2017-09-21 (×2): qty 100

## 2017-09-21 MED ORDER — HYDROMORPHONE HCL 1 MG/ML IJ SOLN
0.5000 mg | INTRAMUSCULAR | Status: DC | PRN
  Administered 2017-09-21 – 2017-09-22 (×2): 0.5 mg via INTRAVENOUS
  Filled 2017-09-21 (×2): qty 0.5

## 2017-09-21 MED ORDER — VITAMIN D (ERGOCALCIFEROL) 1.25 MG (50000 UNIT) PO CAPS: 50000.0000 [IU] | ORAL_CAPSULE | ORAL | Status: DC

## 2017-09-21 MED ORDER — OLANZAPINE 7.5 MG PO TABS
15.0000 mg | ORAL_TABLET | Freq: Every day | ORAL | Status: DC
  Administered 2017-09-21: 15 mg via ORAL
  Filled 2017-09-21: qty 2

## 2017-09-21 MED ORDER — SODIUM CHLORIDE 0.9% FLUSH: 3.0000 mL | INTRAVENOUS | Status: DC | PRN

## 2017-09-21 MED ORDER — ASPIRIN EC 81 MG PO TBEC: 81.0000 mg | DELAYED_RELEASE_TABLET | Freq: Every day | ORAL | Status: DC

## 2017-09-21 MED ORDER — SODIUM CHLORIDE 0.9 % IV SOLN
INTRAVENOUS | Status: DC | PRN
  Administered 2017-09-21: 500 mL

## 2017-09-21 MED ORDER — PHENOL 1.4 % MT LIQD: 1.0000 | OROMUCOSAL | Status: DC | PRN

## 2017-09-21 MED ORDER — FENTANYL CITRATE (PF) 100 MCG/2ML IJ SOLN
INTRAMUSCULAR | Status: DC | PRN
  Administered 2017-09-21: 50 ug via INTRAVENOUS
  Administered 2017-09-21 (×2): 100 ug via INTRAVENOUS

## 2017-09-21 MED ORDER — THROMBIN 5000 UNITS EX SOLR
CUTANEOUS | Status: AC
Start: 2017-09-21 — End: ?
  Filled 2017-09-21: qty 15000

## 2017-09-21 MED ORDER — LACTATED RINGERS IV SOLN
INTRAVENOUS | Status: DC
  Administered 2017-09-21: 09:00:00 via INTRAVENOUS

## 2017-09-21 MED ORDER — PROMETHAZINE HCL 25 MG PO TABS: 25.0000 mg | ORAL_TABLET | Freq: Four times a day (QID) | ORAL | Status: DC | PRN

## 2017-09-21 MED ORDER — DEXAMETHASONE SODIUM PHOSPHATE 10 MG/ML IJ SOLN
INTRAMUSCULAR | Status: DC | PRN
  Administered 2017-09-21: 10 mg via INTRAVENOUS

## 2017-09-21 MED ORDER — AMITRIPTYLINE HCL 75 MG PO TABS
75.0000 mg | ORAL_TABLET | Freq: Every day | ORAL | Status: DC
  Administered 2017-09-21: 75 mg via ORAL
  Filled 2017-09-21: qty 1

## 2017-09-21 MED ORDER — MENTHOL 3 MG MT LOZG: 1.0000 | LOZENGE | OROMUCOSAL | Status: DC | PRN

## 2017-09-21 MED ORDER — PROMETHAZINE HCL 25 MG/ML IJ SOLN: 6.2500 mg | INTRAMUSCULAR | Status: DC | PRN

## 2017-09-21 MED ORDER — ONDANSETRON HCL 4 MG PO TABS: 4.0000 mg | ORAL_TABLET | Freq: Four times a day (QID) | ORAL | Status: DC | PRN

## 2017-09-21 MED ORDER — OXYCODONE HCL 5 MG PO TABS
15.0000 mg | ORAL_TABLET | Freq: Three times a day (TID) | ORAL | Status: DC
  Administered 2017-09-21 (×2): 15 mg via ORAL
  Filled 2017-09-21 (×2): qty 3

## 2017-09-21 MED ORDER — OXYCODONE HCL 5 MG PO TABS
10.0000 mg | ORAL_TABLET | ORAL | Status: DC | PRN
  Administered 2017-09-21 – 2017-09-22 (×3): 10 mg via ORAL
  Filled 2017-09-21 (×3): qty 2

## 2017-09-21 MED ORDER — ACETAMINOPHEN 650 MG RE SUPP: 650.0000 mg | RECTAL | Status: DC | PRN

## 2017-09-21 MED ORDER — HYDROMORPHONE HCL 2 MG/ML IJ SOLN
INTRAMUSCULAR | Status: AC
  Administered 2017-09-21: 0.5 mg via INTRAVENOUS
  Filled 2017-09-21: qty 1

## 2017-09-21 MED ORDER — CYCLOBENZAPRINE HCL 10 MG PO TABS
ORAL_TABLET | ORAL | Status: AC
  Filled 2017-09-21: qty 1

## 2017-09-21 MED ORDER — ONDANSETRON HCL 4 MG/2ML IJ SOLN
INTRAMUSCULAR | Status: DC | PRN
  Administered 2017-09-21: 4 mg via INTRAVENOUS

## 2017-09-21 MED ORDER — NITROGLYCERIN 0.4 MG SL SUBL: 0.4000 mg | SUBLINGUAL_TABLET | SUBLINGUAL | Status: DC | PRN

## 2017-09-21 MED ORDER — 0.9 % SODIUM CHLORIDE (POUR BTL) OPTIME
TOPICAL | Status: DC | PRN
  Administered 2017-09-21: 1000 mL

## 2017-09-21 MED ORDER — SUGAMMADEX SODIUM 200 MG/2ML IV SOLN
INTRAVENOUS | Status: DC | PRN
  Administered 2017-09-21: 200.4 mg via INTRAVENOUS

## 2017-09-21 MED ORDER — BUPROPION HCL ER (XL) 150 MG PO TB24
150.0000 mg | ORAL_TABLET | Freq: Every day | ORAL | Status: DC
  Filled 2017-09-21: qty 1

## 2017-09-21 MED ORDER — PANTOPRAZOLE SODIUM 40 MG PO TBEC: 40.0000 mg | DELAYED_RELEASE_TABLET | Freq: Every day | ORAL | Status: DC

## 2017-09-21 MED ORDER — FENTANYL CITRATE (PF) 250 MCG/5ML IJ SOLN
INTRAMUSCULAR | Status: AC
  Filled 2017-09-21: qty 5

## 2017-09-21 MED ORDER — LAMOTRIGINE 25 MG PO TABS
50.0000 mg | ORAL_TABLET | Freq: Two times a day (BID) | ORAL | Status: DC
Start: 2017-09-21 — End: 2017-09-22
  Administered 2017-09-21: 50 mg via ORAL
  Filled 2017-09-21 (×2): qty 2

## 2017-09-21 MED ORDER — PROPOFOL 10 MG/ML IV BOLUS
INTRAVENOUS | Status: DC | PRN
  Administered 2017-09-21: 150 mg via INTRAVENOUS

## 2017-09-21 MED ORDER — CYCLOBENZAPRINE HCL 10 MG PO TABS
10.0000 mg | ORAL_TABLET | Freq: Three times a day (TID) | ORAL | Status: DC | PRN
  Administered 2017-09-21 – 2017-09-22 (×3): 10 mg via ORAL
  Filled 2017-09-21 (×2): qty 1

## 2017-09-21 MED ORDER — ONDANSETRON HCL 4 MG/2ML IJ SOLN: 4.0000 mg | Freq: Four times a day (QID) | INTRAMUSCULAR | Status: DC | PRN

## 2017-09-21 MED ORDER — FAMOTIDINE IN NACL 20-0.9 MG/50ML-% IV SOLN
20.0000 mg | Freq: Two times a day (BID) | INTRAVENOUS | Status: DC
  Administered 2017-09-21 (×2): 20 mg via INTRAVENOUS
  Filled 2017-09-21 (×2): qty 50

## 2017-09-21 MED ORDER — LEVOTHYROXINE SODIUM 100 MCG PO TABS
50.0000 ug | ORAL_TABLET | Freq: Every day | ORAL | Status: DC
  Administered 2017-09-22: 50 ug via ORAL
  Filled 2017-09-21: qty 1

## 2017-09-21 MED ORDER — LIDOCAINE HCL (CARDIAC) PF 100 MG/5ML IV SOSY
PREFILLED_SYRINGE | INTRAVENOUS | Status: DC | PRN
  Administered 2017-09-21: 50 mg via INTRAVENOUS

## 2017-09-21 MED ORDER — BUSPIRONE HCL 15 MG PO TABS
15.0000 mg | ORAL_TABLET | Freq: Two times a day (BID) | ORAL | Status: DC
  Administered 2017-09-21: 15 mg via ORAL
  Filled 2017-09-21 (×2): qty 1

## 2017-09-21 MED ORDER — MIDAZOLAM HCL 5 MG/5ML IJ SOLN
INTRAMUSCULAR | Status: DC | PRN
  Administered 2017-09-21: 2 mg via INTRAVENOUS

## 2017-09-21 MED ORDER — ROCURONIUM BROMIDE 100 MG/10ML IV SOLN
INTRAVENOUS | Status: DC | PRN
  Administered 2017-09-21: 10 mg via INTRAVENOUS
  Administered 2017-09-21: 50 mg via INTRAVENOUS

## 2017-09-21 MED ORDER — HYDROMORPHONE HCL 2 MG/ML IJ SOLN
0.2500 mg | INTRAMUSCULAR | Status: DC | PRN
  Administered 2017-09-21 (×4): 0.5 mg via INTRAVENOUS

## 2017-09-21 MED ORDER — PROPOFOL 10 MG/ML IV BOLUS
INTRAVENOUS | Status: AC
  Filled 2017-09-21: qty 40

## 2017-09-21 MED ORDER — MIDAZOLAM HCL 2 MG/2ML IJ SOLN
INTRAMUSCULAR | Status: AC
  Filled 2017-09-21: qty 2

## 2017-09-21 MED ORDER — HYDROXYZINE HCL 25 MG PO TABS
50.0000 mg | ORAL_TABLET | Freq: Three times a day (TID) | ORAL | Status: DC
  Administered 2017-09-21 (×2): 50 mg via ORAL
  Filled 2017-09-21 (×2): qty 2

## 2017-09-21 MED ORDER — NICOTINE 14 MG/24HR TD PT24: 14.0000 mg | MEDICATED_PATCH | Freq: Every day | TRANSDERMAL | Status: DC

## 2017-09-21 MED ORDER — CEFAZOLIN SODIUM-DEXTROSE 2-4 GM/100ML-% IV SOLN
2.0000 g | INTRAVENOUS | Status: AC
  Administered 2017-09-21: 2 g via INTRAVENOUS
  Filled 2017-09-21 (×2): qty 100

## 2017-09-21 MED ORDER — ALUM & MAG HYDROXIDE-SIMETH 200-200-20 MG/5ML PO SUSP
30.0000 mL | Freq: Four times a day (QID) | ORAL | Status: DC | PRN
Start: 2017-09-21 — End: 2017-09-22

## 2017-09-21 MED ORDER — SUVOREXANT 10 MG PO TABS: 10.0000 mg | ORAL_TABLET | Freq: Every day | ORAL | Status: DC

## 2017-09-21 MED ORDER — ACETAMINOPHEN 325 MG PO TABS: 650.0000 mg | ORAL_TABLET | ORAL | Status: DC | PRN

## 2017-09-21 MED ORDER — THROMBIN (RECOMBINANT) 5000 UNITS EX SOLR
OROMUCOSAL | Status: DC | PRN
  Administered 2017-09-21: 5 mL via TOPICAL

## 2017-09-21 SURGICAL SUPPLY — 63 items
BAG DECANTER FOR FLEXI CONT (MISCELLANEOUS) ×3 IMPLANT
BASKET BONE COLLECTION (BASKET) ×3 IMPLANT
BENZOIN TINCTURE PRP APPL 2/3 (GAUZE/BANDAGES/DRESSINGS) ×3 IMPLANT
BUR MATCHSTICK NEURO 3.0 LAGG (BURR) ×3 IMPLANT
CANISTER SUCT 3000ML PPV (MISCELLANEOUS) ×3 IMPLANT
CARTRIDGE OIL MAESTRO DRILL (MISCELLANEOUS) ×1 IMPLANT
CLOSURE STERI-STRIP 1/2X4 (GAUZE/BANDAGES/DRESSINGS) ×1
CLOSURE WOUND 1/2 X4 (GAUZE/BANDAGES/DRESSINGS) ×1
CLSR STERI-STRIP ANTIMIC 1/2X4 (GAUZE/BANDAGES/DRESSINGS) ×2 IMPLANT
DERMABOND ADVANCED (GAUZE/BANDAGES/DRESSINGS) ×2
DERMABOND ADVANCED .7 DNX12 (GAUZE/BANDAGES/DRESSINGS) ×1 IMPLANT
DIFFUSER DRILL AIR PNEUMATIC (MISCELLANEOUS) ×3 IMPLANT
DRAPE C-ARM 42X72 X-RAY (DRAPES) ×6 IMPLANT
DRAPE LAPAROTOMY 100X72 PEDS (DRAPES) ×3 IMPLANT
DRAPE MICROSCOPE LEICA (MISCELLANEOUS) ×3 IMPLANT
DRSG OPSITE POSTOP 4X6 (GAUZE/BANDAGES/DRESSINGS) ×3 IMPLANT
DURAPREP 6ML APPLICATOR 50/CS (WOUND CARE) ×3 IMPLANT
ELECT COATED BLADE 2.86 ST (ELECTRODE) ×3 IMPLANT
ELECT REM PT RETURN 9FT ADLT (ELECTROSURGICAL) ×3
ELECTRODE REM PT RTRN 9FT ADLT (ELECTROSURGICAL) ×1 IMPLANT
GAUZE SPONGE 4X4 12PLY STRL (GAUZE/BANDAGES/DRESSINGS) ×3 IMPLANT
GAUZE SPONGE 4X4 16PLY XRAY LF (GAUZE/BANDAGES/DRESSINGS) IMPLANT
GLOVE BIO SURGEON STRL SZ7 (GLOVE) ×3 IMPLANT
GLOVE BIO SURGEON STRL SZ8 (GLOVE) ×3 IMPLANT
GLOVE BIOGEL PI IND STRL 6.5 (GLOVE) ×4 IMPLANT
GLOVE BIOGEL PI IND STRL 7.0 (GLOVE) ×1 IMPLANT
GLOVE BIOGEL PI INDICATOR 6.5 (GLOVE) ×8
GLOVE BIOGEL PI INDICATOR 7.0 (GLOVE) ×2
GLOVE EXAM NITRILE LRG STRL (GLOVE) IMPLANT
GLOVE EXAM NITRILE XL STR (GLOVE) IMPLANT
GLOVE EXAM NITRILE XS STR PU (GLOVE) IMPLANT
GLOVE INDICATOR 8.5 STRL (GLOVE) ×3 IMPLANT
GLOVE SURG SS PI 6.5 STRL IVOR (GLOVE) ×15 IMPLANT
GOWN STRL REUS W/ TWL LRG LVL3 (GOWN DISPOSABLE) ×2 IMPLANT
GOWN STRL REUS W/ TWL XL LVL3 (GOWN DISPOSABLE) ×1 IMPLANT
GOWN STRL REUS W/TWL 2XL LVL3 (GOWN DISPOSABLE) IMPLANT
GOWN STRL REUS W/TWL LRG LVL3 (GOWN DISPOSABLE) ×4
GOWN STRL REUS W/TWL XL LVL3 (GOWN DISPOSABLE) ×2
HALTER HD/CHIN CERV TRACTION D (MISCELLANEOUS) ×3 IMPLANT
HEMOSTAT POWDER KIT SURGIFOAM (HEMOSTASIS) ×3 IMPLANT
KIT BASIN OR (CUSTOM PROCEDURE TRAY) ×3 IMPLANT
KIT TURNOVER KIT B (KITS) ×3 IMPLANT
NEEDLE HYPO 18GX1.5 BLUNT FILL (NEEDLE) IMPLANT
NEEDLE SPNL 20GX3.5 QUINCKE YW (NEEDLE) ×3 IMPLANT
NS IRRIG 1000ML POUR BTL (IV SOLUTION) ×3 IMPLANT
OIL CARTRIDGE MAESTRO DRILL (MISCELLANEOUS) ×3
PACK LAMINECTOMY NEURO (CUSTOM PROCEDURE TRAY) ×3 IMPLANT
PAD ARMBOARD 7.5X6 YLW CONV (MISCELLANEOUS) ×9 IMPLANT
PIN DISTRACTION 14MM (PIN) ×6 IMPLANT
PLATE ANT CERV XTEND 1 LV 14 (Plate) ×3 IMPLANT
RUBBERBAND STERILE (MISCELLANEOUS) ×6 IMPLANT
SCREW VAR 4.2 XD SELF DRILL 14 (Screw) ×12 IMPLANT
SPACER CERVICAL 11X14MM 8MM 0D (Spacer) ×3 IMPLANT
SPONGE INTESTINAL PEANUT (DISPOSABLE) ×6 IMPLANT
SPONGE SURGIFOAM ABS GEL SZ50 (HEMOSTASIS) IMPLANT
STRIP CLOSURE SKIN 1/2X4 (GAUZE/BANDAGES/DRESSINGS) ×2 IMPLANT
SUT VIC AB 3-0 SH 8-18 (SUTURE) ×3 IMPLANT
SUT VICRYL 4-0 PS2 18IN ABS (SUTURE) ×3 IMPLANT
TAPE CLOTH 4X10 WHT NS (GAUZE/BANDAGES/DRESSINGS) IMPLANT
TOWEL GREEN STERILE (TOWEL DISPOSABLE) ×3 IMPLANT
TOWEL GREEN STERILE FF (TOWEL DISPOSABLE) ×3 IMPLANT
TRAP SPECIMEN MUCOUS 40CC (MISCELLANEOUS) IMPLANT
WATER STERILE IRR 1000ML POUR (IV SOLUTION) ×3 IMPLANT

## 2017-09-21 NOTE — Transfer of Care (Signed)
Immediate Anesthesia Transfer of Care Note  Patient: [name]  Procedure(s) Performed: ANTERIOR CERVICAL DECOMPRESSION/DISCECTOMY FUSION - CERVICAL THREE-CERVICAL FOUR (N/A Spine Cervical)  Patient Location: PACU  Anesthesia Type:General  Level of Consciousness: awake and alert   Airway & Oxygen Therapy: Patient Spontanous Breathing and Patient connected to nasal cannula oxygen  Post-op Assessment: Report given to RN and Post -op Vital signs reviewed and stable  Post vital signs: Reviewed and stable  Last Vitals:  Vitals Value Taken Time  BP 157/104 09/21/2017 12:12 PM  Temp    Pulse 102 09/21/2017 12:15 PM  Resp 16 09/21/2017 12:15 PM  SpO2 98 % 09/21/2017 12:15 PM  Vitals shown include unvalidated device data.  Last Pain:  Vitals:   09/21/17 0835  TempSrc:   PainSc: 5       Patients Stated Pain Goal: 3 (51/02/58 [id number])  Complications: No apparent anesthesia complications

## 2017-09-21 NOTE — H&P (Signed)
[name] is an 52 y.o. male.   Chief Complaint: Neck pain left shoulder and arm pain HPI: 52 year old gentleman who's had long-standing issues with his neck and previously undergone a C4-C6 fusion and did very well however separate rest worsening neck pain radiation. His left shoulder occasionally down his left arm to his elbow. This been refractory to all forms of conservative treatment. Workup included cervical myelogram with post myelogram CT as well as dynamic x-rays with flexion extension views. Stenosis with a large spur and instability was picked up at C3-4 above the level of his previous C4-C6 fusion. And due to his progression of clinical syndrome imaging findings and failure conservative treatment I recommended anterior cervical discectomy and fusion at that level. I extensively went over the risks and benefits of that operation with him as well as perioperative course expectations of outcome and alternatives of surgery he understands and agrees to proceed forward.  Past Medical History:  Diagnosis Date  . Accelerated hypertension 08/23/2016   pt denies this  . Anxiety   . Arthritis   . Bipolar affective disorder ([place])   . Chronic back pain   . Chronic kidney disease 2017   AKF - due to Rhabdomyolysis  . DDD (degenerative disc disease)   . Depression   . DJD (degenerative joint disease)   . Elevated liver function tests   . Fibromyalgia   . GERD (gastroesophageal reflux disease)   . Head injury   . Head injury, closed, with concussion    x 3  from falls  . Hyperlipidemia   . Hypothyroidism   . IBS (irritable bowel syndrome)    with diarrhea  . Inguinal hernia    left  . Insomnia   . Myofascial pain syndrome   . Osteopenia   . Pneumonia   . Shortness of breath dyspnea    with exertion  . Tibial plateau fracture, left   . Tremors of nervous system     Past Surgical History:  Procedure Laterality Date  . CARDIAC CATHETERIZATION N/A 08/31/2015   Procedure: Left  Heart Cath and Coronary Angiography;  Surgeon: Leonie Man, MD;  Location: [place] [facility];  Service: Cardiovascular;  Laterality: N/A;  . Carotid Dopplers Bilateral 02/20/2015   [facility]: Mild, less than 39% left and right internal carotid artery stenosis. No significant plaque burden  . CERVICAL DISC SURGERY     C5-7  . COLONOSCOPY    . COLONOSCOPY    . ESOPHAGOGASTRODUODENOSCOPY    . FASCIOTOMY Left 10/20/2013   Procedure: LEFT leg ANTERIOR COMPARTMENT FACSCIOTOMY;  Surgeon: Rozanna Box, MD;  Location: [place];  Service: Orthopedics;  Laterality: Left;  . INGUINAL HERNIA REPAIR Left   . Nuclear Stress Test  03/06/2015   [facility]: Normal EKG. Low normal EF (49%) normal regional wall motion. No evidence of ischemia or infarction.  . ORIF TIBIA PLATEAU Left 10/20/2013   Procedure: OPEN REDUCTION INTERNAL FIXATION (ORIF) LEFT TIBIAL PLATEAU;  Surgeon: Rozanna Box, MD;  Location: [place];  Service: Orthopedics;  Laterality: Left;  . SEPTOPLASTY    . SPINAL CORD STIMULATOR BATTERY EXCHANGE N/A 02/08/2016   Procedure: Lumbar spinal cord stimulator implantable pulse generator replacement;  Surgeon: Clydell Hakim, MD;  Location: [place] [facility];  Service: Neurosurgery;  Laterality: N/A;  . SPINAL CORD STIMULATOR IMPLANT    . TRANSTHORACIC ECHOCARDIOGRAM  02/20/2015   [facility]: Mild concentric LVH. EF 55-60%. Normal regional wall motion. Mild to  moderate TR with no significant pulmonary hypertension.    Family History  Problem Relation Age of Onset  . Heart disease Mother   . Hypertension Mother   . Sudden death Mother        Presumably cardiac  . Hyperlipidemia Father   . Heart attack Father 54       At least 5 MIs. Had CABG.  . Heart failure Father   . Diabetes Father   . Hypertension Father   . Kidney disease Father   . Diabetes Brother   . Lung cancer Maternal Grandmother   . Diabetes Paternal Grandmother   . Sudden death Paternal  Grandmother        Unclear etiology  . Diabetes Unknown   . Kidney disease Unknown   . Kidney disease Brother   . Hypertension Brother   . Sudden death Brother        Thought to be related to heart disease  . Throat cancer Brother   . Colon cancer Neg Hx   . Mental illness Neg Hx    Social History:  reports that he quit smoking about 5 weeks ago. His smoking use included cigarettes. He has a 35.00 pack-year smoking history. He has never used smokeless tobacco. He reports that he has current or past drug history. Drugs: Oxycodone and Cocaine. He reports that he does not drink alcohol.  Allergies:  Allergies  Allergen Reactions  . Zolpidem Tartrate Other (See Comments)    Hallucinations and sleep walks  . Lyrica [Pregabalin] Other (See Comments)    Causes extreme depression  . Demerol [Meperidine] Itching, Rash and Hives    Medications Prior to Admission  Medication Sig Dispense Refill  . amitriptyline (ELAVIL) 25 MG tablet Take 75 mg by mouth at bedtime.    Marland Kitchen aspirin EC 81 MG tablet Take 1 tablet (81 mg total) by mouth daily. For hearth health    . buPROPion (WELLBUTRIN XL) 150 MG 24 hr tablet Take 150 mg by mouth daily.    . busPIRone (BUSPAR) 15 MG tablet Take 1 tablet (15 mg total) by mouth 2 (two) times daily. For anxiety 60 tablet 0  . esomeprazole (NEXIUM) 40 MG capsule Take 1 capsule (40 mg total) by mouth daily at 12 noon. For indigestion (Patient taking differently: Take 40 mg by mouth daily. For indigestion) 30 capsule 0  . hydrOXYzine (ATARAX/VISTARIL) 50 MG tablet Take 50 mg by mouth 3 (three) times daily.    Marland Kitchen lamoTRIgine (LAMICTAL) 100 MG tablet Take 50 mg by mouth 2 (two) times daily.    Marland Kitchen levothyroxine (SYNTHROID, LEVOTHROID) 50 MCG tablet Take 50 mcg by mouth daily before breakfast.    . nicotine (NICODERM CQ - DOSED IN MG/24 HOURS) 14 mg/24hr patch Place 1 patch (14 mg total) onto the skin daily. For smoking ceasation 28 patch 0  . nicotine polacrilex (COMMIT) 4 MG  lozenge Take 4 mg by mouth as needed for smoking cessation.    Marland Kitchen OLANZapine (ZYPREXA) 15 MG tablet Take 1 tablet (15 mg total) by mouth at bedtime. For bipolar 30 tablet 0  . Omega-3 Fatty Acids (FISH OIL PO) Take 2,000 mg by mouth 2 (two) times daily.    Marland Kitchen oxyCODONE (ROXICODONE) 15 MG immediate release tablet Take 15 mg by mouth 3 (three) times daily.    . Suvorexant (BELSOMRA) 10 MG TABS Take 10 mg by mouth at bedtime.     . Vitamin D, Ergocalciferol, (DRISDOL) 50000 units CAPS capsule Take 50,000 Units  by mouth every Thursday.    Marland Kitchen amitriptyline (ELAVIL) 10 MG tablet Take 2 tablets (20 mg total) by mouth at bedtime. For depression (Patient not taking: Reported on 09/09/2017) 30 tablet 0  . hydrOXYzine (ATARAX/VISTARIL) 25 MG tablet Take 1 tablet (25 mg total) by mouth every 8 (eight) hours as needed for anxiety. For anxiety (Patient not taking: Reported on 09/09/2017) 75 tablet 0  . lamoTRIgine (LAMICTAL) 25 MG tablet Take 1 tablet (25 mgs) in the morning & 2 tablets (50 mgs) at bedtime: For mood stabilization (Patient not taking: Reported on 09/09/2017) 90 tablet 0  . levothyroxine (SYNTHROID, LEVOTHROID) 75 MCG tablet Take 1 tablet (75 mcg total) by mouth daily before breakfast. For low thyroid (Patient not taking: Reported on 09/09/2017)    . nitroGLYCERIN (NITROSTAT) 0.4 MG SL tablet Place 0.4 mg under the tongue every 5 (five) minutes as needed for chest pain.    . Oxycodone HCl 10 MG TABS Take 1 tablet (10 mg total) by mouth 4 (four) times daily. For pain (Patient not taking: Reported on 09/09/2017)  0  . promethazine (PHENERGAN) 25 MG tablet Take 25 mg by mouth every 6 (six) hours as needed for nausea or vomiting.      No results found for this or any previous visit (from the past 48 hour(s)). No results found.  Review of Systems  Musculoskeletal: Positive for joint pain and neck pain.  Neurological: Positive for tingling and sensory change.    Blood pressure (!) 131/113, pulse 96,  temperature 98.3 F (36.8 C), temperature source Oral, resp. rate 20, height 6' (1.829 m), weight 100.2 kg (221 lb), SpO2 97 %. Physical Exam  Constitutional: He is oriented to person, place, and time. He appears well-developed.  HENT:  Head: Normocephalic.  Eyes: Pupils are equal, round, and reactive to light.  Neck: Normal range of motion.  Respiratory: Effort normal.  GI: Soft. Bowel sounds are normal.  Neurological: He is alert and oriented to person, place, and time. He has normal strength. GCS eye subscore is 4. GCS verbal subscore is 5. GCS motor subscore is 6.  Strength out of 5 deltoid, bicep, tricep, wrist flexion, wrist extension, hand intrinsics.     Assessment/Plan 51 years gentleman presents for an ACDF at C3-4.  Mackinze Criado P, MD 09/21/2017, 10:19 AM

## 2017-09-21 NOTE — Anesthesia Postprocedure Evaluation (Signed)
Anesthesia Post Note  Patient: [name]  Procedure(s) Performed: ANTERIOR CERVICAL DECOMPRESSION/DISCECTOMY FUSION - CERVICAL THREE-CERVICAL FOUR (N/A Spine Cervical)     Patient location during evaluation: PACU Anesthesia Type: General Level of consciousness: awake and alert Pain management: pain level controlled Vital Signs Assessment: post-procedure vital signs reviewed and stable Respiratory status: spontaneous breathing, nonlabored ventilation, respiratory function stable and patient connected to nasal cannula oxygen Cardiovascular status: blood pressure returned to baseline and stable Postop Assessment: no apparent nausea or vomiting Anesthetic complications: no    Last Vitals:  Vitals:   09/21/17 1230 09/21/17 1245  BP: (!) 165/103 (!) 157/111  Pulse: (!) 102 100  Resp: 13 12  Temp:    SpO2: 97% 96%    Last Pain:  Vitals:   09/21/17 1256  TempSrc:   PainSc: 7                  Tiajuana Amass

## 2017-09-21 NOTE — Anesthesia Procedure Notes (Signed)
Procedure Name: Intubation Date/Time: 09/21/2017 10:38 AM Performed by: Eligha Bridegroom, CRNA Pre-anesthesia Checklist: Patient identified, Emergency Drugs available, Suction available, Patient being monitored and Timeout performed Patient Re-evaluated:Patient Re-evaluated prior to induction Oxygen Delivery Method: Circle system utilized Preoxygenation: Pre-oxygenation with 100% oxygen Induction Type: IV induction Ventilation: Oral airway inserted - appropriate to patient size and Mask ventilation without difficulty Laryngoscope Size: Glidescope Grade View: Grade I Tube type: Oral Tube size: 7.5 mm Number of attempts: 1 Airway Equipment and Method: Stylet Placement Confirmation: ETT inserted through vocal cords under direct vision and breath sounds checked- equal and bilateral Secured at: 22 cm Tube secured with: Tape Dental Injury: Teeth and Oropharynx as per pre-operative assessment

## 2017-09-21 NOTE — Op Note (Signed)
Preoperative diagnosis: Cervical spondylosis with stenosis and left greater right C4 radicular pain  Postoperative diagnosis: Same  Procedure: Anterior cervical discectomy and fusion at C3-4 utilizing allograft spacer with a hollowed out core packed with locally harvested autograft. Globus extend plating system with a 40 mm plate and 1-61 mm variable-angle screws  Surgeon: Dominica Severin Rithy Mandley  Anesthesia: Gen.  EBL: Middle  History of present illness: 52 year old gentleman with long-standing neck pain bilateral shoulder and arm pain worse on the left. Workup revealed severe cervical spondylosis and above his previous C4-C6 fusion. Due to patient progressive clinical syndrome imaging findings and failure conservative treatment I recommended an ACDF at C3-4. I extensively went over the risks and benefits of the operation with him as well as perioperative course expectations of outcome and alternatives of surgery and he understood and agreed to proceed forward.  Operative procedure: Patient brought into the or was induced on general anesthesia positioned prone neck in slight extension in 5 pounds of halter traction the right side of his neck was prepped and draped in routine sterile fashion. Preoperative x-ray localize the appropriate level so a curvilinear incision was made just off midline to the anterior border of sternomastoid and superficially platysmas dissected by longitudinally the avascular plane to sternomastoid and strap muscles was developed down the. Fascia precautions doesn't [place]. I immediately identified the old plate dissected the disc space right above this. Exposed the C3 and C4 vertebral bodies reflected longus Cole laterally and self-retaining retractor was placed. This patient was incised and cleanout pituitary rongeurs anterior aspect of bitten off with [place] rongeur disc space is then drilled down capturing the bone shavings and bone basket. Then under microscopic illumination under  biting the endplates allowed indication of posterior longitudinal ligament was was removed in piecemeal fashion. Marching laterally the C4 pedicles identified in the right C4 nerve root on the right was decompressed. Marching to the left there is a large posterior spur coming off the C3 vertebral body and the uncinate process. This is all aggressively removed decompressing the left side of the spinal canal as well as left C4 nerve root. C4 pedicles identified on the left and the C4 nerve root on the left was skeletonized flush with pedicle. At the discectomy was no further stenosis either foraminally or centrally I sized up an 8 mm allograft packed with locally harvested autograft and inserted at 2 mm deep to the anterior vertebral line. Then a 14 mm globus plate was selected all screws excellent purchase locking mechanism was gauged. Meticulous hemostasis was maintained the wound scope to irrigate and the wounds closed in layers with interrupted Vicryl and a running 4 subcuticular Dermabond benzo and Steri-Strips and a sterile dressing was applied patient recovered in stable condition. At the end the case all needle counts sponge counts were correct.

## 2017-09-22 DIAGNOSIS — M4802 Spinal stenosis, cervical region: Secondary | ICD-10-CM | POA: Diagnosis not present

## 2017-09-22 DIAGNOSIS — I700 Atherosclerosis of aorta: Secondary | ICD-10-CM | POA: Diagnosis not present

## 2017-09-22 DIAGNOSIS — M1990 Unspecified osteoarthritis, unspecified site: Secondary | ICD-10-CM | POA: Diagnosis not present

## 2017-09-22 DIAGNOSIS — I2510 Atherosclerotic heart disease of native coronary artery without angina pectoris: Secondary | ICD-10-CM | POA: Diagnosis not present

## 2017-09-22 DIAGNOSIS — M47812 Spondylosis without myelopathy or radiculopathy, cervical region: Secondary | ICD-10-CM | POA: Diagnosis not present

## 2017-09-22 DIAGNOSIS — I10 Essential (primary) hypertension: Secondary | ICD-10-CM | POA: Diagnosis not present

## 2017-09-22 DIAGNOSIS — F319 Bipolar disorder, unspecified: Secondary | ICD-10-CM | POA: Diagnosis not present

## 2017-09-22 DIAGNOSIS — F419 Anxiety disorder, unspecified: Secondary | ICD-10-CM | POA: Diagnosis not present

## 2017-09-22 DIAGNOSIS — M5116 Intervertebral disc disorders with radiculopathy, lumbar region: Secondary | ICD-10-CM | POA: Diagnosis not present

## 2017-09-22 MED ORDER — FAMOTIDINE 20 MG PO TABS: 20.0000 mg | ORAL_TABLET | Freq: Two times a day (BID) | ORAL | Status: DC

## 2017-09-22 MED ORDER — CYCLOBENZAPRINE HCL 10 MG PO TABS: 10.0000 mg | ORAL_TABLET | Freq: Three times a day (TID) | ORAL | 0 refills | Status: DC | PRN

## 2017-09-22 MED FILL — Thrombin For Soln 5000 Unit: CUTANEOUS | Qty: 5000 | Status: AC

## 2017-09-22 NOTE — Discharge Summary (Signed)
Physician Discharge Summary  Patient ID: [name] MRN: [id number] DOB/AGE: 12-17-1965 52 y.o.  Admit date: 09/21/2017 Discharge date: 09/22/2017  Admission Diagnoses: Cervical spondylosis with stenosis and left greater right C4 radicular pain   Discharge Diagnoses: same   Discharged Condition: good  Hospital Course: The patient was admitted on 09/21/2017 and taken to the operating room where the patient underwent acdf c3-4. The patient tolerated the procedure well and was taken to the recovery room and then to the floor in stable condition. The hospital course was routine. There were no complications. The wound remained clean dry and intact. Pt had appropriate neck soreness. No complaints of arm pain or new N/T/W. The patient remained afebrile with stable vital signs, and tolerated a regular diet. The patient continued to increase activities, and pain was well controlled with oral pain medications.   Consults: None  Significant Diagnostic Studies:  Results for orders placed or performed during the hospital encounter of 09/14/17  Surgical pcr screen  Result Value Ref Range   MRSA, PCR NEGATIVE NEGATIVE   Staphylococcus aureus NEGATIVE NEGATIVE  Comprehensive metabolic panel  Result Value Ref Range   Sodium 137 135 - 145 mmol/L   Potassium 4.2 3.5 - 5.1 mmol/L   Chloride 107 101 - 111 mmol/L   CO2 20 (L) 22 - 32 mmol/L   Glucose, Bld 119 (H) 65 - 99 mg/dL   BUN 18 6 - 20 mg/dL   Creatinine, Ser 1.13 0.61 - 1.24 mg/dL   Calcium 9.2 8.9 - 10.3 mg/dL   Total Protein 7.0 6.5 - 8.1 g/dL   Albumin 3.9 3.5 - 5.0 g/dL   AST 39 15 - 41 U/L   ALT 47 17 - 63 U/L   Alkaline Phosphatase 80 38 - 126 U/L   Total Bilirubin 0.5 0.3 - 1.2 mg/dL   GFR calc non Af Amer >60 >60 mL/min   GFR calc Af Amer >60 >60 mL/min   Anion gap 10 5 - 15  CBC  Result Value Ref Range   WBC 10.3 4.0 - 10.5 K/uL   RBC 4.64 4.22 - 5.81 MIL/uL   Hemoglobin 13.8 13.0 - 17.0 g/dL   HCT 40.4 39.0 - 52.0 %    MCV 87.1 78.0 - 100.0 fL   MCH 29.7 26.0 - 34.0 pg   MCHC 34.2 30.0 - 36.0 g/dL   RDW 12.9 11.5 - 15.5 %   Platelets 307 150 - 400 K/uL    Dg Cervical Spine 1 View  Result Date: 09/21/2017 CLINICAL DATA:  52 year old male for C3-4 fusion. Initial encounter. EXAM: DG C-ARM 61-120 MIN; DG CERVICAL SPINE - 1 VIEW COMPARISON:  08/31/2017 postmyelogram CT. Fluoroscopic time: 3 seconds. FINDINGS: Single intraoperative C-arm view submitted for review after surgery. Evaluation inferior to C5 limited by shoulder artifact. Prior fusion C4-5 and C5-6. Interval C3-4 fusion with interbody spacer, anterior plate and screws in place without complication noted on single lateral projection submitted. Patient intubated. IMPRESSION: Interval C3-4 fusion. Remote fusion C4-C6. Electronically Signed   By: Genia Del M.D.   On: 09/21/2017 12:28   Ct Cervical Spine W Contrast  Result Date: 08/31/2017 CLINICAL DATA:  Low back pain extending into the lower extremities bilaterally. L5 radiculopathy. Upper extremity tingling and weakness. Bilateral neck pain. Cracking and popping in the neck. FLUOROSCOPY TIME:  Radiation Exposure Index (as provided by the fluoroscopic device): 292.94 uGy*m2 Fluoroscopy Time:  1 minutes 8 seconds Number of Acquired Images:  27 PROCEDURE: LUMBAR PUNCTURE FOR  CERVICAL AND LUMBAR MYELOGRAM CERVICAL AND LUMBAR MYELOGRAM CT CERVICAL MYELOGRAM CT LUMBAR MYELOGRAM After thorough discussion of risks and benefits of the procedure including bleeding, infection, injury to nerves, blood vessels, adjacent structures as well as headache and CSF leak, written and oral informed consent was obtained. Consent was obtained by Dr. San Morelle. Patient was positioned prone on the fluoroscopy table. Local anesthesia was provided with 1% lidocaine without epinephrine after prepped and draped in the usual sterile fashion. Puncture was performed at L2-3 using a 3 1/2 inch 22-gauge spinal needle via right  paramedian approach. Using a single pass through the dura, the needle was placed within the thecal sac, with return of clear CSF. 10 mL of Isovue M-300 was injected into the thecal sac, with normal opacification of the nerve roots and cauda equina consistent with free flow within the subarachnoid space. The patient was then moved to the trendelenburg position and contrast flowed into the Cervical spine region. I personally performed the lumbar puncture and administered the intrathecal contrast. I also personally supervised acquisition of the myelogram images. TECHNIQUE: Contiguous axial images were obtained through the Cervical and Lumbar spine after the intrathecal infusion of infusion. Coronal and sagittal reconstructions were obtained of the axial image sets. FINDINGS: CERVICAL AND LUMBAR MYELOGRAM FINDINGS: Slight retrolisthesis is evident at L4-5. There is minimal anterolisthesis at L5-S1. A broad-based disc protrusion at L4-5 is worse with standing and partially reduced in flexion. Atherosclerotic calcifications are present in the abdominal aorta without evidence for aneurysm. Anterior cervical fusion is present at C4-5 and C5-6. Adjacent level disease is present at C3-4 a broad-based disc osteophyte complex present at C3-4. There is blunting of the nerve roots bilaterally. Slight retrolisthesis is associated. There is no significant change with standing. Exaggerated listhesis is present at C3-4. CT CERVICAL MYELOGRAM FINDINGS: Cervical spine is imaged from the skull base through T2-3. Solid anterior fusion is present at C4-5 and C5-6. Diffuse endplate degenerative changes are present at C6-7. Soft tissues of the neck otherwise unremarkable. The lung apices are clear. C2-3: Asymmetric left-sided uncovertebral spurring contributes to mild left foraminal stenosis. The central canal is patent. C3-4: A broad-based disc osteophyte complex is present. There is partial effacement of the ventral CSF. Uncovertebral and  facet hypertrophy contribute to moderate foraminal narrowing bilaterally. C4-5: Solid anterior fusion is present. No residual or recurrent stenosis is present. C5-6: Solid anterior fusion is present. No residual recurrent stenosis is present. C6-7: Uncovertebral and facet hypertrophy contributes to severe foraminal stenosis bilaterally, left greater than right. The central canal is patent. C7-T1: Uncovertebral and facet hypertrophy contribute to mild foraminal narrowing bilaterally. The central canal is patent. CT LUMBAR MYELOGRAM FINDINGS: The lumbar spine is imaged from the midbody of T12 through S2-3. Conus medullaris terminates at T12-L1. Vertebral body heights are maintained. Slight retrolisthesis is present at L4-5. A water density lesion posterolaterally in the right kidney is stable. Atherosclerotic calcifications are present in the aorta. There is no aneurysm. No significant adenopathy is present. L1-2: Negative. L2-3: Mild facet hypertrophy is present bilaterally. There is no significant focal disc protrusion or stenosis. L3-4: Mild leftward disc bulging is present. Facet hypertrophy is noted bilaterally. No significant stenosis is present. L4-5: A broad-based disc protrusion is present. Mild facet hypertrophy is noted bilaterally. The central canal is patent. The disc extends into both foramina without significant stenosis. L5-S1: A mild broad-based disc protrusion extends into both neural foramina. No significant stenosis is present. IMPRESSION: 1. Broad-based disc protrusion and slight retrolisthesis  at L4-5 without significant stenosis in the supine position on CT. 2. Disc protrusion at L4-5 is worse with standing. This could impact the traversing nerve roots. 3. Mild broad-based disc protrusion without significant stenosis at L5-S1. 4. Mild leftward disc bulging at L3-4 without significant stenosis. 5.  Aortic Atherosclerosis (ICD10-I70.0). 6. Solid fusion at C4-5 and C5-6 without residual or  recurrent stenosis at either level. 7. Adjacent level disease at C3-4 contribute to mild central and moderate bilateral foraminal stenosis. There is some dynamic listhesis with flexion. 8. A mild left foraminal narrowing at C2-3 due to asymmetric uncovertebral disease. 9. Adjacent level disease at C6-7 with severe foraminal stenosis bilaterally due to uncovertebral and facet spurring. 10. Mild foraminal narrowing bilaterally at C7-T1 due to uncovertebral and facet spurring. Electronically Signed   By: San Morelle M.D.   On: 08/31/2017 11:44   Ct Lumbar Spine W Contrast  Result Date: 08/31/2017 CLINICAL DATA:  Low back pain extending into the lower extremities bilaterally. L5 radiculopathy. Upper extremity tingling and weakness. Bilateral neck pain. Cracking and popping in the neck. FLUOROSCOPY TIME:  Radiation Exposure Index (as provided by the fluoroscopic device): 292.94 uGy*m2 Fluoroscopy Time:  1 minutes 8 seconds Number of Acquired Images:  27 PROCEDURE: LUMBAR PUNCTURE FOR CERVICAL AND LUMBAR MYELOGRAM CERVICAL AND LUMBAR MYELOGRAM CT CERVICAL MYELOGRAM CT LUMBAR MYELOGRAM After thorough discussion of risks and benefits of the procedure including bleeding, infection, injury to nerves, blood vessels, adjacent structures as well as headache and CSF leak, written and oral informed consent was obtained. Consent was obtained by Dr. San Morelle. Patient was positioned prone on the fluoroscopy table. Local anesthesia was provided with 1% lidocaine without epinephrine after prepped and draped in the usual sterile fashion. Puncture was performed at L2-3 using a 3 1/2 inch 22-gauge spinal needle via right paramedian approach. Using a single pass through the dura, the needle was placed within the thecal sac, with return of clear CSF. 10 mL of Isovue M-300 was injected into the thecal sac, with normal opacification of the nerve roots and cauda equina consistent with free flow within the subarachnoid  space. The patient was then moved to the trendelenburg position and contrast flowed into the Cervical spine region. I personally performed the lumbar puncture and administered the intrathecal contrast. I also personally supervised acquisition of the myelogram images. TECHNIQUE: Contiguous axial images were obtained through the Cervical and Lumbar spine after the intrathecal infusion of infusion. Coronal and sagittal reconstructions were obtained of the axial image sets. FINDINGS: CERVICAL AND LUMBAR MYELOGRAM FINDINGS: Slight retrolisthesis is evident at L4-5. There is minimal anterolisthesis at L5-S1. A broad-based disc protrusion at L4-5 is worse with standing and partially reduced in flexion. Atherosclerotic calcifications are present in the abdominal aorta without evidence for aneurysm. Anterior cervical fusion is present at C4-5 and C5-6. Adjacent level disease is present at C3-4 a broad-based disc osteophyte complex present at C3-4. There is blunting of the nerve roots bilaterally. Slight retrolisthesis is associated. There is no significant change with standing. Exaggerated listhesis is present at C3-4. CT CERVICAL MYELOGRAM FINDINGS: Cervical spine is imaged from the skull base through T2-3. Solid anterior fusion is present at C4-5 and C5-6. Diffuse endplate degenerative changes are present at C6-7. Soft tissues of the neck otherwise unremarkable. The lung apices are clear. C2-3: Asymmetric left-sided uncovertebral spurring contributes to mild left foraminal stenosis. The central canal is patent. C3-4: A broad-based disc osteophyte complex is present. There is partial effacement of the ventral CSF.  Uncovertebral and facet hypertrophy contribute to moderate foraminal narrowing bilaterally. C4-5: Solid anterior fusion is present. No residual or recurrent stenosis is present. C5-6: Solid anterior fusion is present. No residual recurrent stenosis is present. C6-7: Uncovertebral and facet hypertrophy contributes  to severe foraminal stenosis bilaterally, left greater than right. The central canal is patent. C7-T1: Uncovertebral and facet hypertrophy contribute to mild foraminal narrowing bilaterally. The central canal is patent. CT LUMBAR MYELOGRAM FINDINGS: The lumbar spine is imaged from the midbody of T12 through S2-3. Conus medullaris terminates at T12-L1. Vertebral body heights are maintained. Slight retrolisthesis is present at L4-5. A water density lesion posterolaterally in the right kidney is stable. Atherosclerotic calcifications are present in the aorta. There is no aneurysm. No significant adenopathy is present. L1-2: Negative. L2-3: Mild facet hypertrophy is present bilaterally. There is no significant focal disc protrusion or stenosis. L3-4: Mild leftward disc bulging is present. Facet hypertrophy is noted bilaterally. No significant stenosis is present. L4-5: A broad-based disc protrusion is present. Mild facet hypertrophy is noted bilaterally. The central canal is patent. The disc extends into both foramina without significant stenosis. L5-S1: A mild broad-based disc protrusion extends into both neural foramina. No significant stenosis is present. IMPRESSION: 1. Broad-based disc protrusion and slight retrolisthesis at L4-5 without significant stenosis in the supine position on CT. 2. Disc protrusion at L4-5 is worse with standing. This could impact the traversing nerve roots. 3. Mild broad-based disc protrusion without significant stenosis at L5-S1. 4. Mild leftward disc bulging at L3-4 without significant stenosis. 5.  Aortic Atherosclerosis (ICD10-I70.0). 6. Solid fusion at C4-5 and C5-6 without residual or recurrent stenosis at either level. 7. Adjacent level disease at C3-4 contribute to mild central and moderate bilateral foraminal stenosis. There is some dynamic listhesis with flexion. 8. A mild left foraminal narrowing at C2-3 due to asymmetric uncovertebral disease. 9. Adjacent level disease at C6-7  with severe foraminal stenosis bilaterally due to uncovertebral and facet spurring. 10. Mild foraminal narrowing bilaterally at C7-T1 due to uncovertebral and facet spurring. Electronically Signed   By: San Morelle M.D.   On: 08/31/2017 11:44   Dg C-arm 1-60 Min  Result Date: 09/21/2017 CLINICAL DATA:  52 year old male for C3-4 fusion. Initial encounter. EXAM: DG C-ARM 61-120 MIN; DG CERVICAL SPINE - 1 VIEW COMPARISON:  08/31/2017 postmyelogram CT. Fluoroscopic time: 3 seconds. FINDINGS: Single intraoperative C-arm view submitted for review after surgery. Evaluation inferior to C5 limited by shoulder artifact. Prior fusion C4-5 and C5-6. Interval C3-4 fusion with interbody spacer, anterior plate and screws in place without complication noted on single lateral projection submitted. Patient intubated. IMPRESSION: Interval C3-4 fusion. Remote fusion C4-C6. Electronically Signed   By: Genia Del M.D.   On: 09/21/2017 12:28   Dg Myelography Lumbar Inj Multi Region  Result Date: 08/31/2017 CLINICAL DATA:  Low back pain extending into the lower extremities bilaterally. L5 radiculopathy. Upper extremity tingling and weakness. Bilateral neck pain. Cracking and popping in the neck. FLUOROSCOPY TIME:  Radiation Exposure Index (as provided by the fluoroscopic device): 292.94 uGy*m2 Fluoroscopy Time:  1 minutes 8 seconds Number of Acquired Images:  27 PROCEDURE: LUMBAR PUNCTURE FOR CERVICAL AND LUMBAR MYELOGRAM CERVICAL AND LUMBAR MYELOGRAM CT CERVICAL MYELOGRAM CT LUMBAR MYELOGRAM After thorough discussion of risks and benefits of the procedure including bleeding, infection, injury to nerves, blood vessels, adjacent structures as well as headache and CSF leak, written and oral informed consent was obtained. Consent was obtained by Dr. San Morelle. Patient was positioned prone  on the fluoroscopy table. Local anesthesia was provided with 1% lidocaine without epinephrine after prepped and draped in the  usual sterile fashion. Puncture was performed at L2-3 using a 3 1/2 inch 22-gauge spinal needle via right paramedian approach. Using a single pass through the dura, the needle was placed within the thecal sac, with return of clear CSF. 10 mL of Isovue M-300 was injected into the thecal sac, with normal opacification of the nerve roots and cauda equina consistent with free flow within the subarachnoid space. The patient was then moved to the trendelenburg position and contrast flowed into the Cervical spine region. I personally performed the lumbar puncture and administered the intrathecal contrast. I also personally supervised acquisition of the myelogram images. TECHNIQUE: Contiguous axial images were obtained through the Cervical and Lumbar spine after the intrathecal infusion of infusion. Coronal and sagittal reconstructions were obtained of the axial image sets. FINDINGS: CERVICAL AND LUMBAR MYELOGRAM FINDINGS: Slight retrolisthesis is evident at L4-5. There is minimal anterolisthesis at L5-S1. A broad-based disc protrusion at L4-5 is worse with standing and partially reduced in flexion. Atherosclerotic calcifications are present in the abdominal aorta without evidence for aneurysm. Anterior cervical fusion is present at C4-5 and C5-6. Adjacent level disease is present at C3-4 a broad-based disc osteophyte complex present at C3-4. There is blunting of the nerve roots bilaterally. Slight retrolisthesis is associated. There is no significant change with standing. Exaggerated listhesis is present at C3-4. CT CERVICAL MYELOGRAM FINDINGS: Cervical spine is imaged from the skull base through T2-3. Solid anterior fusion is present at C4-5 and C5-6. Diffuse endplate degenerative changes are present at C6-7. Soft tissues of the neck otherwise unremarkable. The lung apices are clear. C2-3: Asymmetric left-sided uncovertebral spurring contributes to mild left foraminal stenosis. The central canal is patent. C3-4: A  broad-based disc osteophyte complex is present. There is partial effacement of the ventral CSF. Uncovertebral and facet hypertrophy contribute to moderate foraminal narrowing bilaterally. C4-5: Solid anterior fusion is present. No residual or recurrent stenosis is present. C5-6: Solid anterior fusion is present. No residual recurrent stenosis is present. C6-7: Uncovertebral and facet hypertrophy contributes to severe foraminal stenosis bilaterally, left greater than right. The central canal is patent. C7-T1: Uncovertebral and facet hypertrophy contribute to mild foraminal narrowing bilaterally. The central canal is patent. CT LUMBAR MYELOGRAM FINDINGS: The lumbar spine is imaged from the midbody of T12 through S2-3. Conus medullaris terminates at T12-L1. Vertebral body heights are maintained. Slight retrolisthesis is present at L4-5. A water density lesion posterolaterally in the right kidney is stable. Atherosclerotic calcifications are present in the aorta. There is no aneurysm. No significant adenopathy is present. L1-2: Negative. L2-3: Mild facet hypertrophy is present bilaterally. There is no significant focal disc protrusion or stenosis. L3-4: Mild leftward disc bulging is present. Facet hypertrophy is noted bilaterally. No significant stenosis is present. L4-5: A broad-based disc protrusion is present. Mild facet hypertrophy is noted bilaterally. The central canal is patent. The disc extends into both foramina without significant stenosis. L5-S1: A mild broad-based disc protrusion extends into both neural foramina. No significant stenosis is present. IMPRESSION: 1. Broad-based disc protrusion and slight retrolisthesis at L4-5 without significant stenosis in the supine position on CT. 2. Disc protrusion at L4-5 is worse with standing. This could impact the traversing nerve roots. 3. Mild broad-based disc protrusion without significant stenosis at L5-S1. 4. Mild leftward disc bulging at L3-4 without significant  stenosis. 5.  Aortic Atherosclerosis (ICD10-I70.0). 6. Solid fusion at C4-5 and  C5-6 without residual or recurrent stenosis at either level. 7. Adjacent level disease at C3-4 contribute to mild central and moderate bilateral foraminal stenosis. There is some dynamic listhesis with flexion. 8. A mild left foraminal narrowing at C2-3 due to asymmetric uncovertebral disease. 9. Adjacent level disease at C6-7 with severe foraminal stenosis bilaterally due to uncovertebral and facet spurring. 10. Mild foraminal narrowing bilaterally at C7-T1 due to uncovertebral and facet spurring. Electronically Signed   By: San Morelle M.D.   On: 08/31/2017 11:44    Antibiotics:  Anti-infectives (From admission, onward)   Start     Dose/Rate Route Frequency Ordered Stop   09/21/17 1900  ceFAZolin (ANCEF) IVPB 2g/100 mL premix     2 g 200 mL/hr over 30 Minutes Intravenous Every 8 hours 09/21/17 1431 09/22/17 0359   09/21/17 1103  bacitracin 50,000 Units in sodium chloride 0.9 % 500 mL irrigation  Status:  Discontinued       As needed 09/21/17 1103 09/21/17 1209   09/21/17 1015  ceFAZolin (ANCEF) IVPB 2g/100 mL premix     2 g 200 mL/hr over 30 Minutes Intravenous To [facility] 09/21/17 1007 09/21/17 1045      Discharge Exam: Blood pressure 129/88, pulse (!) 107, temperature 98.9 F (37.2 C), temperature source Oral, resp. rate 18, height 6' (1.829 m), weight 100.2 kg (221 lb), SpO2 94 %. Neurologic: Grossly normal Ambulating and voiding well  Discharge Medications:   Allergies as of 09/22/2017      Reactions   Zolpidem Tartrate Other (See Comments)   Hallucinations and sleep walks   Lyrica [pregabalin] Other (See Comments)   Causes extreme depression   Demerol [meperidine] Itching, Rash, Hives      Medication List    TAKE these medications   amitriptyline 25 MG tablet Commonly known as:  ELAVIL Take 75 mg by mouth at bedtime.   amitriptyline 10 MG tablet Commonly known as:   ELAVIL Take 2 tablets (20 mg total) by mouth at bedtime. For depression   aspirin EC 81 MG tablet Take 1 tablet (81 mg total) by mouth daily. For hearth health   BELSOMRA 10 MG Tabs Generic drug:  Suvorexant Take 10 mg by mouth at bedtime.   buPROPion 150 MG 24 hr tablet Commonly known as:  WELLBUTRIN XL Take 150 mg by mouth daily.   busPIRone 15 MG tablet Commonly known as:  BUSPAR Take 1 tablet (15 mg total) by mouth 2 (two) times daily. For anxiety   cyclobenzaprine 10 MG tablet Commonly known as:  FLEXERIL Take 1 tablet (10 mg total) by mouth 3 (three) times daily as needed for muscle spasms.   esomeprazole 40 MG capsule Commonly known as:  NEXIUM Take 1 capsule (40 mg total) by mouth daily at 12 noon. For indigestion What changed:    when to take this  additional instructions   FISH OIL PO Take 2,000 mg by mouth 2 (two) times daily.   hydrOXYzine 50 MG tablet Commonly known as:  ATARAX/VISTARIL Take 50 mg by mouth 3 (three) times daily.   hydrOXYzine 25 MG tablet Commonly known as:  ATARAX/VISTARIL Take 1 tablet (25 mg total) by mouth every 8 (eight) hours as needed for anxiety. For anxiety   lamoTRIgine 100 MG tablet Commonly known as:  LAMICTAL Take 50 mg by mouth 2 (two) times daily.   lamoTRIgine 25 MG tablet Commonly known as:  LAMICTAL Take 1 tablet (25 mgs) in the morning & 2 tablets (50 mgs) at  bedtime: For mood stabilization   levothyroxine 50 MCG tablet Commonly known as:  SYNTHROID, LEVOTHROID Take 50 mcg by mouth daily before breakfast. What changed:  Another medication with the same name was removed. Continue taking this medication, and follow the directions you see here.   nicotine 14 mg/24hr patch Commonly known as:  NICODERM CQ - dosed in mg/24 hours Place 1 patch (14 mg total) onto the skin daily. For smoking ceasation   nicotine polacrilex 4 MG lozenge Commonly known as:  COMMIT Take 4 mg by mouth as needed for smoking cessation.    nitroGLYCERIN 0.4 MG SL tablet Commonly known as:  NITROSTAT Place 0.4 mg under the tongue every 5 (five) minutes as needed for chest pain.   OLANZapine 15 MG tablet Commonly known as:  ZYPREXA Take 1 tablet (15 mg total) by mouth at bedtime. For bipolar   oxyCODONE 15 MG immediate release tablet Commonly known as:  ROXICODONE Take 15 mg by mouth 3 (three) times daily. What changed:  Another medication with the same name was removed. Continue taking this medication, and follow the directions you see here.   promethazine 25 MG tablet Commonly known as:  PHENERGAN Take 25 mg by mouth every 6 (six) hours as needed for nausea or vomiting.   Vitamin D (Ergocalciferol) 50000 units Caps capsule Commonly known as:  DRISDOL Take 50,000 Units by mouth every Thursday.       Disposition: home   Final Dx: same  Discharge Instructions     Remove dressing in 72 hours   Complete by:  As directed    Call MD for:  difficulty breathing, headache or visual disturbances   Complete by:  As directed    Call MD for:  extreme fatigue   Complete by:  As directed    Call MD for:  hives   Complete by:  As directed    Call MD for:  persistant dizziness or light-headedness   Complete by:  As directed    Call MD for:  persistant nausea and vomiting   Complete by:  As directed    Call MD for:  redness, tenderness, or signs of infection (pain, swelling, redness, odor or green/yellow discharge around incision site)   Complete by:  As directed    Call MD for:  severe uncontrolled pain   Complete by:  As directed    Call MD for:  temperature >100.4   Complete by:  As directed    Diet - low sodium heart healthy   Complete by:  As directed    Driving Restrictions   Complete by:  As directed    No driving 2 weeks   Increase activity slowly   Complete by:  As directed    Lifting restrictions   Complete by:  As directed    Nothing heavier than 8 lbs         Signed: Ocie Cornfield  Senaya Dicenso 09/22/2017, 7:36 AM

## 2017-09-22 NOTE — Progress Notes (Signed)
Pt doing well. Pt given D/C instructions with Rx, verbal understanding was provided. Pt's incision is clean and dry with no sign of infection. Pt's IV was removed prior to D/C. Pt D/C'd home via wheelchair per MD order. Pt is stable @ D/C and has no other needs at this time. Holli Humbles, RN

## 2017-09-22 NOTE — Discharge Instructions (Signed)
Wound Care Keep incision covered and dry for one week.  If you shower prior to then, cover incision with plastic wrap.  You may remove outer bandage after one week and shower.  Do not put any creams, lotions, or ointments on incision. Leave steri-strips on neck.  They will fall off by themselves. Activity Walk each and every day, increasing distance each day. No lifting greater than 5 lbs.  Avoid excessive neck motion. No driving for 2 weeks; may ride as a passenger locally. Wear neck brace at all times except when showering or otherwise instructed. Diet Resume your normal diet.  Return to Work Will be discussed at you follow up appointment. Call Your Doctor If Any of These Occur Redness, drainage, or swelling at the wound.  Temperature greater than 101 degrees. Severe pain not relieved by pain medication. Increased difficulty swallowing.  Incision starts to come apart. Follow Up Appt Call today for appointment in 1-2 weeks ([phone]) or for problems.  If you have any hardware placed in your spine, you will need an x-ray before your appointment.    Anterior Cervical Diskectomy and Fusion, Care After Refer to this sheet in the next few weeks. These instructions provide you with information about caring for yourself after your procedure. Your health care provider may also give you more specific instructions. Your treatment has been planned according to current medical practices, but problems sometimes occur. Call your health care provider if you have any problems or questions after your procedure. What can I expect after the procedure? After the procedure, it is common to have:  Neck pain.  Discomfort when swallowing.  Slight hoarseness.  Follow these instructions at home: If You Have a Neck Brace:  Wear it as told by your health care provider. Remove it only as told by your health care provider.  Keep the brace clean and dry. Incision care   Follow instructions from your  health care provider about how to take care of the cut made during surgery (incision). Make sure you: ? Wash your hands with soap and water before you change your bandage (dressing). If soap and water are not available, use hand sanitizer. ? Change your dressing as told by your health care provider. ? Leave stitches (sutures), skin glue, or adhesive strips in place. These skin closures may need to stay in place for 2 weeks or longer. If adhesive strip edges start to loosen and curl up, you may trim the loose edges. Do not remove adhesive strips completely unless your health care provider tells you to do that.  Check your incision every day for signs of infection. Watch for: ? Redness, swelling, or pain. ? Fluid or blood. ? Warmth. ? Pus or a bad smell. Managing pain, stiffness, and swelling  Take over-the-counter and prescription medicines only as told by your health care provider.  If directed, apply ice to the injured area. ? Put ice in a plastic bag. ? Place a towel between your skin and the bag. ? Leave the ice on for 20 minutes, 2-3 times per day. Activity  Return to your normal activities as told by your health care provider. Ask your health care provider what activities are safe for you.  Do exercises as told by your health care provider.  Do not lift anything that is heavier than 10 lb (4.5 kg). General instructions  Do not drive or operate heavy machinery while taking prescription pain medicines.  Do not use any tobacco products, including cigarettes, chewing tobacco,  or e-cigarettes. Tobacco can delay healing. If you need help quitting, ask your health care provider.  Keep all follow-up visits and physical therapy appointments as told by your health care provider. This is important. Contact a health care provider if:  You have a fever.  You have redness, swelling, or pain around your incision.  You have fluid or blood coming from your incision.  You have pus or a bad  smell coming from your incision.  You have pain that is not controlled by your pain medicine.  You have increasing hoarseness or trouble swallowing. Get help right away if:  You have severe pain.  You have sudden numbness or weakness in your arms.  You have warmth, tenderness, or swelling in your calf.  You have chest pain.  You have difficulty breathing. This information is not intended to replace advice given to you by your health care provider. Make sure you discuss any questions you have with your health care provider. Document Released: 06/15/2015 Document Revised: 10/25/2015 Document Reviewed: 05/03/2015 Elsevier Interactive Patient Education  [employer].

## 2017-09-24 ENCOUNTER — Encounter (HOSPITAL_COMMUNITY): Payer: Self-pay | Admitting: Neurosurgery

## 2017-10-03 ENCOUNTER — Other Ambulatory Visit: Payer: Self-pay

## 2017-10-03 ENCOUNTER — Encounter (HOSPITAL_COMMUNITY): Payer: Self-pay | Admitting: *Deleted

## 2017-10-03 ENCOUNTER — Emergency Department (HOSPITAL_COMMUNITY)
Admission: EM | Admit: 2017-10-03 | Discharge: 2017-10-04 | Disposition: A | Discharge status: Other Acute Inpatient Hospital | Payer: 59 | Attending: Emergency Medicine | Admitting: Emergency Medicine

## 2017-10-03 DIAGNOSIS — E039 Hypothyroidism, unspecified: Secondary | ICD-10-CM | POA: Insufficient documentation

## 2017-10-03 DIAGNOSIS — Z720 Tobacco use: Secondary | ICD-10-CM

## 2017-10-03 DIAGNOSIS — Z7982 Long term (current) use of aspirin: Secondary | ICD-10-CM | POA: Diagnosis not present

## 2017-10-03 DIAGNOSIS — F315 Bipolar disorder, current episode depressed, severe, with psychotic features: Secondary | ICD-10-CM | POA: Diagnosis not present

## 2017-10-03 DIAGNOSIS — N189 Chronic kidney disease, unspecified: Secondary | ICD-10-CM | POA: Diagnosis not present

## 2017-10-03 DIAGNOSIS — F3164 Bipolar disorder, current episode mixed, severe, with psychotic features: Secondary | ICD-10-CM | POA: Diagnosis not present

## 2017-10-03 DIAGNOSIS — D473 Essential (hemorrhagic) thrombocythemia: Secondary | ICD-10-CM | POA: Insufficient documentation

## 2017-10-03 DIAGNOSIS — F419 Anxiety disorder, unspecified: Secondary | ICD-10-CM | POA: Insufficient documentation

## 2017-10-03 DIAGNOSIS — F1410 Cocaine abuse, uncomplicated: Secondary | ICD-10-CM | POA: Insufficient documentation

## 2017-10-03 DIAGNOSIS — R443 Hallucinations, unspecified: Secondary | ICD-10-CM

## 2017-10-03 DIAGNOSIS — Z79899 Other long term (current) drug therapy: Secondary | ICD-10-CM | POA: Insufficient documentation

## 2017-10-03 DIAGNOSIS — Z008 Encounter for other general examination: Secondary | ICD-10-CM

## 2017-10-03 DIAGNOSIS — F209 Schizophrenia, unspecified: Secondary | ICD-10-CM | POA: Insufficient documentation

## 2017-10-03 DIAGNOSIS — F319 Bipolar disorder, unspecified: Secondary | ICD-10-CM | POA: Insufficient documentation

## 2017-10-03 DIAGNOSIS — F3110 Bipolar disorder, current episode manic without psychotic features, unspecified: Secondary | ICD-10-CM | POA: Diagnosis present

## 2017-10-03 DIAGNOSIS — F1210 Cannabis abuse, uncomplicated: Secondary | ICD-10-CM | POA: Diagnosis not present

## 2017-10-03 DIAGNOSIS — Z87891 Personal history of nicotine dependence: Secondary | ICD-10-CM | POA: Insufficient documentation

## 2017-10-03 DIAGNOSIS — D75839 Thrombocytosis, unspecified: Secondary | ICD-10-CM

## 2017-10-03 LAB — COMPREHENSIVE METABOLIC PANEL
ALBUMIN: 4.3 g/dL (ref 3.5–5.0)
ALK PHOS: 92 U/L (ref 38–126)
ALT: 19 U/L (ref 17–63)
AST: 20 U/L (ref 15–41)
Anion gap: 12 (ref 5–15)
BILIRUBIN TOTAL: 0.9 mg/dL (ref 0.3–1.2)
BUN: 23 mg/dL — AB (ref 6–20)
CO2: 20 mmol/L — AB (ref 22–32)
Calcium: 10.1 mg/dL (ref 8.9–10.3)
Chloride: 105 mmol/L (ref 101–111)
Creatinine, Ser: 1.19 mg/dL (ref 0.61–1.24)
GFR calc Af Amer: >60 mL/min (ref >60)
GFR calc non Af Amer: >60 mL/min (ref >60)
GLUCOSE: 102 mg/dL — AB (ref 65–99)
POTASSIUM: 3.8 mmol/L (ref 3.5–5.1)
SODIUM: 137 mmol/L (ref 135–145)
TOTAL PROTEIN: 8.3 g/dL — AB (ref 6.5–8.1)

## 2017-10-03 LAB — RAPID URINE DRUG SCREEN, HOSP PERFORMED
AMPHETAMINES: NOT DETECTED
BARBITURATES: NOT DETECTED
Benzodiazepines: POSITIVE — AB
Cocaine: NOT DETECTED
Opiates: POSITIVE — AB
TETRAHYDROCANNABINOL: NOT DETECTED

## 2017-10-03 LAB — CBC
HEMATOCRIT: 42.3 % (ref 39.0–52.0)
HEMOGLOBIN: 14.7 g/dL (ref 13.0–17.0)
MCH: 29.5 pg (ref 26.0–34.0)
MCHC: 34.8 g/dL (ref 30.0–36.0)
MCV: 84.9 fL (ref 78.0–100.0)
Platelets: 457 10*3/uL — ABNORMAL HIGH (ref 150–400)
RBC: 4.98 MIL/uL (ref 4.22–5.81)
RDW: 12.5 % (ref 11.5–15.5)
WBC: 12.9 10*3/uL — ABNORMAL HIGH (ref 4.0–10.5)

## 2017-10-03 LAB — ETHANOL: Alcohol, Ethyl (B): <10 mg/dL (ref <10)

## 2017-10-03 LAB — SALICYLATE LEVEL: Salicylate Lvl: <7 mg/dL (ref 2.8–30.0)

## 2017-10-03 LAB — ACETAMINOPHEN LEVEL: Acetaminophen (Tylenol), Serum: <10 ug/mL — ABNORMAL LOW (ref 10–30)

## 2017-10-03 MED ORDER — PANTOPRAZOLE SODIUM 40 MG PO TBEC: 80.0000 mg | DELAYED_RELEASE_TABLET | Freq: Every day | ORAL | Status: DC

## 2017-10-03 MED ORDER — LAMOTRIGINE 25 MG PO TABS
50.0000 mg | ORAL_TABLET | Freq: Two times a day (BID) | ORAL | Status: DC
  Administered 2017-10-04 (×2): 50 mg via ORAL
  Filled 2017-10-03 (×2): qty 2

## 2017-10-03 MED ORDER — LEVOTHYROXINE SODIUM 50 MCG PO TABS
50.0000 ug | ORAL_TABLET | Freq: Every day | ORAL | Status: DC
  Administered 2017-10-04: 50 ug via ORAL
  Filled 2017-10-03: qty 1

## 2017-10-03 MED ORDER — ALUM & MAG HYDROXIDE-SIMETH 200-200-20 MG/5ML PO SUSP: 30.0000 mL | Freq: Four times a day (QID) | ORAL | Status: DC | PRN

## 2017-10-03 MED ORDER — NITROGLYCERIN 0.4 MG SL SUBL: 0.4000 mg | SUBLINGUAL_TABLET | SUBLINGUAL | Status: DC | PRN

## 2017-10-03 MED ORDER — ONDANSETRON HCL 4 MG PO TABS: 4.0000 mg | ORAL_TABLET | Freq: Three times a day (TID) | ORAL | Status: DC | PRN

## 2017-10-03 MED ORDER — AMITRIPTYLINE HCL 25 MG PO TABS
75.0000 mg | ORAL_TABLET | Freq: Every day | ORAL | Status: DC
  Administered 2017-10-04: 75 mg via ORAL
  Filled 2017-10-03: qty 3

## 2017-10-03 MED ORDER — HYDROXYZINE HCL 25 MG PO TABS
50.0000 mg | ORAL_TABLET | Freq: Three times a day (TID) | ORAL | Status: DC
  Administered 2017-10-04 (×3): 50 mg via ORAL
  Filled 2017-10-03 (×3): qty 2

## 2017-10-03 MED ORDER — OLANZAPINE 5 MG PO TABS
15.0000 mg | ORAL_TABLET | Freq: Every day | ORAL | Status: DC
  Administered 2017-10-04: 15 mg via ORAL
  Filled 2017-10-03: qty 1

## 2017-10-03 MED ORDER — SUVOREXANT 10 MG PO TABS: 10.0000 mg | ORAL_TABLET | Freq: Every day | ORAL | Status: DC

## 2017-10-03 MED ORDER — PANTOPRAZOLE SODIUM 40 MG PO TBEC
40.0000 mg | DELAYED_RELEASE_TABLET | Freq: Every day | ORAL | Status: DC
  Administered 2017-10-04: 40 mg via ORAL
  Filled 2017-10-03: qty 1

## 2017-10-03 MED ORDER — PROMETHAZINE HCL 25 MG PO TABS
25.0000 mg | ORAL_TABLET | Freq: Four times a day (QID) | ORAL | Status: DC | PRN
  Administered 2017-10-04: 25 mg via ORAL
  Filled 2017-10-03: qty 1

## 2017-10-03 MED ORDER — OXYCODONE HCL 5 MG PO TABS
15.0000 mg | ORAL_TABLET | Freq: Three times a day (TID) | ORAL | Status: DC
  Administered 2017-10-04 (×2): 15 mg via ORAL
  Filled 2017-10-03 (×2): qty 3

## 2017-10-03 MED ORDER — CYCLOBENZAPRINE HCL 10 MG PO TABS
10.0000 mg | ORAL_TABLET | Freq: Three times a day (TID) | ORAL | Status: DC | PRN
  Administered 2017-10-04 (×3): 10 mg via ORAL
  Filled 2017-10-03 (×3): qty 1

## 2017-10-03 MED ORDER — NICOTINE 21 MG/24HR TD PT24
21.0000 mg | MEDICATED_PATCH | Freq: Every day | TRANSDERMAL | Status: DC
  Administered 2017-10-04: 21 mg via TRANSDERMAL
  Filled 2017-10-03: qty 1

## 2017-10-03 MED ORDER — ASPIRIN EC 81 MG PO TBEC
81.0000 mg | DELAYED_RELEASE_TABLET | Freq: Every day | ORAL | Status: DC
  Administered 2017-10-04: 81 mg via ORAL
  Filled 2017-10-03: qty 1

## 2017-10-03 MED ORDER — BUSPIRONE HCL 10 MG PO TABS
15.0000 mg | ORAL_TABLET | Freq: Two times a day (BID) | ORAL | Status: DC
  Administered 2017-10-04 (×2): 15 mg via ORAL
  Filled 2017-10-03 (×2): qty 2

## 2017-10-03 MED ORDER — ACETAMINOPHEN 325 MG PO TABS
650.0000 mg | ORAL_TABLET | ORAL | Status: DC | PRN
  Administered 2017-10-04: 650 mg via ORAL
  Filled 2017-10-03: qty 2

## 2017-10-03 MED ORDER — BUPROPION HCL ER (XL) 150 MG PO TB24
150.0000 mg | ORAL_TABLET | Freq: Every day | ORAL | Status: DC
  Administered 2017-10-04: 150 mg via ORAL
  Filled 2017-10-03 (×2): qty 1

## 2017-10-03 MED ORDER — VITAMIN D (ERGOCALCIFEROL) 1.25 MG (50000 UNIT) PO CAPS: 50000.0000 [IU] | ORAL_CAPSULE | ORAL | Status: DC

## 2017-10-03 NOTE — ED Notes (Signed)
Bed: [facility] Expected date:  Expected time:  Means of arrival:  Comments: No bed 

## 2017-10-03 NOTE — Progress Notes (Signed)
TTS consult ordered for the pt in [name]. Unable to complete the assessment for the pt in the hallway. Pt must be moved to a private room. Conference room is in use. TTS attempted to contact the pt's nurse to advise but no answer. Pt must be relocated to a private room for confidentiality purposes prior to TTS completing the assessment.  Lind Covert, MSW, LCSW Therapeutic Triage Specialist  [phone]

## 2017-10-03 NOTE — BH Assessment (Addendum)
Assessment Note  [name] is an 52 y.o. male who presents to the ED voluntarily. Pt reports he has been experiencing AVH with command. Pt stated "there are about 7 other people in this room right now" referring to the assessment room. Pt states the AH tell him to kill himself and they are currently screaming at him. 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 Pt states today they "came back with a vengeance."  Pt denies that he wants to kill himself but states the voices continue to scream at him and tell him to kill himself. Pt denies any specific triggers or changes in his life. Pt states he recently had neck surgery and is still currently wearing a neck brace. Pt has a hx of inpt admissions c/o Bipolar disorder with psychotic features. Pt states there are several voices in his head and some are telling the other voices to "shut up."   TTS consulted with Lindon Romp, NP who recommends inpt treatment. EDP. Dr. Rogene Houston, MD notified of the disposition. Pt's nurse Markus Daft, RN aware of the recommendation.  Diagnosis: Schizophrenia   Past Medical History:  Past Medical History:  Diagnosis Date  . Accelerated hypertension 08/23/2016   pt denies this  . Anxiety   . Arthritis   . Bipolar affective disorder ([place])   . Chronic back pain   . Chronic kidney disease 2017   AKF - due to Rhabdomyolysis  . DDD (degenerative disc disease)   . Depression   . DJD (degenerative joint disease)   . Elevated liver function tests   . Fibromyalgia   . GERD (gastroesophageal reflux disease)   . Head injury   . Head injury, closed, with concussion    x 3  from falls  . Hyperlipidemia   . Hypothyroidism   . IBS (irritable bowel syndrome)    with diarrhea  . Inguinal hernia    left  . Insomnia   . Myofascial pain syndrome   . Osteopenia   . Pneumonia   . Shortness of breath dyspnea    with exertion  . Tibial plateau fracture, left   . Tremors of  nervous system     Past Surgical History:  Procedure Laterality Date  . ANTERIOR CERVICAL DECOMP/DISCECTOMY FUSION N/A 09/21/2017   Procedure: ANTERIOR CERVICAL DECOMPRESSION/DISCECTOMY FUSION - CERVICAL THREE-CERVICAL FOUR;  Surgeon: Kary Kos, MD;  Location: [place];  Service: Neurosurgery;  Laterality: N/A;  . CARDIAC CATHETERIZATION N/A 08/31/2015   Procedure: Left Heart Cath and Coronary Angiography;  Surgeon: Leonie Man, MD;  Location: [place] [facility];  Service: Cardiovascular;  Laterality: N/A;  . Carotid Dopplers Bilateral 02/20/2015   [facility]: Mild, less than 39% left and right internal carotid artery stenosis. No significant plaque burden  . CERVICAL DISC SURGERY     C5-7  . COLONOSCOPY    . COLONOSCOPY    . ESOPHAGOGASTRODUODENOSCOPY    . FASCIOTOMY Left 10/20/2013   Procedure: LEFT leg ANTERIOR COMPARTMENT FACSCIOTOMY;  Surgeon: Rozanna Box, MD;  Location: [place];  Service: Orthopedics;  Laterality: Left;  . INGUINAL HERNIA REPAIR Left   . Nuclear Stress Test  03/06/2015   [facility]: Normal EKG. Low normal EF (49%) normal regional wall motion. No evidence of ischemia or infarction.  . ORIF TIBIA PLATEAU Left 10/20/2013   Procedure: OPEN REDUCTION INTERNAL FIXATION (ORIF) LEFT TIBIAL PLATEAU;  Surgeon: Astrid Divine  Marcelino Scot, MD;  Location: [place];  Service: Orthopedics;  Laterality: Left;  . SEPTOPLASTY    . SPINAL CORD STIMULATOR BATTERY EXCHANGE N/A 02/08/2016   Procedure: Lumbar spinal cord stimulator implantable pulse generator replacement;  Surgeon: Clydell Hakim, MD;  Location: [place] [facility];  Service: Neurosurgery;  Laterality: N/A;  . SPINAL CORD STIMULATOR IMPLANT    . TRANSTHORACIC ECHOCARDIOGRAM  02/20/2015   [facility]: Mild concentric LVH. EF 55-60%. Normal regional wall motion. Mild to moderate TR with no significant pulmonary hypertension.    Family History:  Family History  Problem Relation Age of Onset  . Heart disease  Mother   . Hypertension Mother   . Sudden death Mother        Presumably cardiac  . Hyperlipidemia Father   . Heart attack Father 37       At least 5 MIs. Had CABG.  . Heart failure Father   . Diabetes Father   . Hypertension Father   . Kidney disease Father   . Diabetes Brother   . Lung cancer Maternal Grandmother   . Diabetes Paternal Grandmother   . Sudden death Paternal Grandmother        Unclear etiology  . Diabetes Unknown   . Kidney disease Unknown   . Kidney disease Brother   . Hypertension Brother   . Sudden death Brother        Thought to be related to heart disease  . Throat cancer Brother   . Colon cancer Neg Hx   . Mental illness Neg Hx     Social History:  reports that he quit smoking about 7 weeks ago. His smoking use included cigarettes. He has a 35.00 pack-year smoking history. He has never used smokeless tobacco. He reports that he has current or past drug history. Drugs: Oxycodone and Cocaine. He reports that he does not drink alcohol.  Additional Social History:  Alcohol / Drug Use Pain Medications: See MAR Prescriptions: See MAR Over the Counter: See MAR History of alcohol / drug use?: No history of alcohol / drug abuse  CIWA: CIWA-Ar BP: 134/90 Pulse Rate: 88 COWS:    Allergies:  Allergies  Allergen Reactions  . Zolpidem Tartrate Other (See Comments)    Hallucinations and sleep walks  . Lyrica [Pregabalin] Other (See Comments)    Causes extreme depression  . Demerol [Meperidine] Itching, Rash and Hives    Home Medications:  (Not in a hospital admission)  OB/GYN Status:  No LMP for male patient.  General Assessment Data Assessment unable to be completed: Yes Reason for not completing assessment: TTS consult ordered for the pt in [facility]. Unable to complete the assessment for the pt in the hallway. Pt must be moved to a private room. Conference room is in use. TTS attempted to contact the pt's nurse to advise but no answer. Pt must be  relocated to a private room for confidentiality purposes prior to TTS completing the assessment. Location of Assessment: [facility] TTS Assessment: In system Is this a Tele or Face-to-Face Assessment?: Face-to-Face Is this an Initial Assessment or a Re-assessment for this encounter?: Initial Assessment Marital status: Separated Is patient pregnant?: No Pregnancy Status: No Living Arrangements: Alone Can pt return to current living arrangement?: Yes Admission Status: Voluntary Is patient capable of signing voluntary admission?: Yes Referral Source: Self/Family/Friend Insurance type: UMR     Crisis Care Plan Living Arrangements: Alone Name of Psychiatrist: Dr. Darleene Cleaver, MD Name of Therapist: none  Education Status Is  patient currently in school?: No Is the patient employed, unemployed or receiving disability?: Receiving disability income  Risk to self with the past 6 months Suicidal Ideation: No Has patient been a risk to self within the past 6 months prior to admission? : No Suicidal Intent: No Has patient had any suicidal intent within the past 6 months prior to admission? : No Is patient at risk for suicide?: No Suicidal Plan?: No Has patient had any suicidal plan within the past 6 months prior to admission? : No Access to Means: No What has been your use of drugs/alcohol within the last 12 months?: denies use, states he stopped drinking alcohol in 1999 Previous Attempts/Gestures: No Triggers for Past Attempts: None known Intentional Self Injurious Behavior: None Family Suicide History: No Recent stressful life event(s): Other (Comment)(AVH) Persecutory voices/beliefs?: Yes Depression: Yes Depression Symptoms: Feeling worthless/self pity Substance abuse history and/or treatment for substance abuse?: [phone]) Suicide prevention information given to non-admitted patients: Not applicable  Risk to Others within the past 6 months Homicidal Ideation: No Does patient have any  lifetime risk of violence toward others beyond the six months prior to admission? : No Thoughts of Harm to Others: No Current Homicidal Intent: No Current Homicidal Plan: No Access to Homicidal Means: No History of harm to others?: No Assessment of Violence: None Noted Does patient have access to weapons?: No Criminal Charges Pending?: No Does patient have a court date: No Is patient on probation?: No  Psychosis Hallucinations: Auditory, With command, Visual Delusions: None noted  Mental Status Report Appearance/Hygiene: Disheveled, In scrubs Eye Contact: Fair Motor Activity: Unsteady Speech: Logical/coherent Level of Consciousness: Alert Mood: Anxious, Helpless, Depressed Affect: Anxious, Depressed Anxiety Level: Severe Thought Processes: Relevant, Coherent Judgement: Impaired Orientation: Person, Place, Time, Situation, Appropriate for developmental age Obsessive Compulsive Thoughts/Behaviors: None  Cognitive Functioning Concentration: Normal Memory: Remote Intact, Recent Intact Is patient IDD: No Is patient DD?: No Insight: Poor Impulse Control: Poor Appetite: Good Have you had any weight changes? : No Change Sleep: No Change Total Hours of Sleep: 6 Vegetative Symptoms: None  ADLScreening [facility] Assessment Services) Patient's cognitive ability adequate to safely complete daily activities?: Yes Patient able to express need for assistance with ADLs?: Yes Independently performs ADLs?: Yes (appropriate for developmental age)  Prior Inpatient Therapy Prior Inpatient Therapy: Yes Prior Therapy Dates: 2010, 2012, 2017, 2018 Prior Therapy Facilty/Provider(s): [facility] Reason for Treatment: BIPOLAR  Prior Outpatient Therapy Prior Outpatient Therapy: Yes Prior Therapy Dates: CURRENT Prior Therapy Facilty/Provider(s): DR. Darleene Cleaver Reason for Treatment: MED MANAGEMENT Does patient have an ACCT team?: No Does patient have Intensive In-House Services?  : No Does patient  have [facility] services? : No Does patient have P4CC services?: No  ADL Screening (condition at time of admission) Patient's cognitive ability adequate to safely complete daily activities?: Yes Is the patient deaf or have difficulty hearing?: No Does the patient have difficulty seeing, even when wearing glasses/contacts?: No Does the patient have difficulty concentrating, remembering, or making decisions?: No Patient able to express need for assistance with ADLs?: Yes Does the patient have difficulty dressing or bathing?: No Independently performs ADLs?: Yes (appropriate for developmental age) Does the patient have difficulty walking or climbing stairs?: Yes Weakness of Legs: Both Weakness of Arms/Hands: None  Home Assistive Devices/Equipment Home Assistive Devices/Equipment: Cane (specify quad or straight)(neck brace )    Abuse/Neglect Assessment (Assessment to be complete while patient is alone) Abuse/Neglect Assessment Can Be Completed: Yes Physical Abuse: Denies Verbal Abuse: Denies Sexual Abuse: Denies  Exploitation of patient/patient's resources: Denies Self-Neglect: Denies     Regulatory affairs officer (For Healthcare) Does Patient Have a Medical Advance Directive?: No Would patient like information on creating a medical advance directive?: No - Patient declined    Additional Information 1:1 In Past 12 Months?: No CIRT Risk: No Elopement Risk: No Does patient have medical clearance?: Yes     Disposition: TTS consulted with Lindon Romp, NP who recommends inpt treatment. EDP. Dr. Rogene Houston, MD notified of the disposition. Pt's nurse Markus Daft, RN aware of the recommendation.  Disposition Initial Assessment Completed for this Encounter: Yes Disposition of Patient: Admit Type of inpatient treatment program: Adult(per Lindon Romp, NP) Patient refused recommended treatment: No  On Site Evaluation by:   Reviewed with Physician:    Lyanne Co 10/03/2017 11:31 PM

## 2017-10-03 NOTE — ED Triage Notes (Signed)
Pt stated "I see Dr. Darleene Cleaver, called him and told him I was telling him I was having hallucinations that were telling me to kill myself.  So he told me to come here so he can change my meds around."

## 2017-10-03 NOTE — ED Provider Notes (Signed)
[facility] DEPT Provider Note   CSN: [id number] Arrival date & time: 10/03/17  2000     History   Chief Complaint Chief Complaint  Patient presents with  . Suicidal  . Hallucinations    HPI [name] is a 52 y.o. male with a PMHx of HTN, anxiety, bipolar disorder, chronic back pain, CKD, DDD/DJD, fibromyalgia, GERD, HLD, hypothyroidism, IBS, and other medical conditions listed below, who presents to the ED with complaints of gradual onset hallucinations that began around 10 AM, approximately 12 hours prior to evaluation.  Patient states that he is hearing voices telling him to kill himself, and seeing people that he knows are not there.  He denies suicidal ideations, homicidal ideations, illicit drug use, or alcohol use.  He admits to smoking cigarettes but is trying to quit.  He is compliant with all of his usual medications, no medications have changed recently, and his last dose of psych meds was around 6 PM.  He recently had neck surgery and states that the medications that were listed at that time are all still accurate.  He denies any medical complaints at this time and is here voluntarily.    The history is provided by the patient and medical records. No language interpreter was used.    Past Medical History:  Diagnosis Date  . Accelerated hypertension 08/23/2016   pt denies this  . Anxiety   . Arthritis   . Bipolar affective disorder ([place])   . Chronic back pain   . Chronic kidney disease 2017   AKF - due to Rhabdomyolysis  . DDD (degenerative disc disease)   . Depression   . DJD (degenerative joint disease)   . Elevated liver function tests   . Fibromyalgia   . GERD (gastroesophageal reflux disease)   . Head injury   . Head injury, closed, with concussion    x 3  from falls  . Hyperlipidemia   . Hypothyroidism   . IBS (irritable bowel syndrome)    with diarrhea  . Inguinal hernia    left  . Insomnia   . Myofascial pain  syndrome   . Osteopenia   . Pneumonia   . Shortness of breath dyspnea    with exertion  . Tibial plateau fracture, left   . Tremors of nervous system     Patient Active Problem List   Diagnosis Date Noted  . Spinal stenosis of cervical region 09/21/2017  . Chronic pain syndrome 10/28/2016  . Cannabis use disorder, mild, abuse 10/28/2016  . Rhabdomyolysis 08/23/2016  . AKI (acute kidney injury) ([place]) 08/23/2016  . Transaminitis 08/23/2016  . Drug overdose, multiple drugs 08/23/2016  . Tachycardia 08/23/2016  . Accelerated hypertension 08/23/2016  . Bipolar disorder, curr episode mixed, severe, with psychotic features ([place]) 12/26/2015  . Cocaine use disorder, mild, abuse ([place]) 12/26/2015  . History of rhabdomyolysis 12/26/2015  . Liver injury 12/26/2015  . Hx of renal failure 12/26/2015  . Angina, class III ([place]) 08/18/2015  . Dyslipidemia, goal LDL below 70 08/18/2015  . Family history of premature coronary artery disease 08/18/2015  . Tobacco use disorder, continuous 08/18/2015  . Tibial plateau fracture 10/02/2013  . Multiple falls 10/02/2013  . Alteration in self-care ability 10/02/2013  . Hypothyroidism, lithium induced 10/02/2013  . Fracture of tibia with fibula, left, closed 10/02/2013  . Chronic back pain 10/02/2013  . Spinal cord stimulator, status-post 10/02/2013  . History of suicidal ideation 10/02/2013  . Postural imbalance with levoscoliosis, h/o  10/02/2013  . Degenerative disc disease, lumbar 09/07/2013  . Chest pain 12/25/2012  . Leg pain 02/24/2011  . Neck pain 02/24/2011  . Right hip pain 02/24/2011  . IRRITABLE BOWEL SYNDROME 08/09/2008  . HYPERLIPIDEMIA 10/28/2006  . DISORDER, BIPOLAR NOS 10/28/2006  . ANXIETY 10/28/2006  . DEPRESSION 10/28/2006  . PAIN, CHRONIC NEC 10/28/2006  . GERD 10/28/2006    Past Surgical History:  Procedure Laterality Date  . ANTERIOR CERVICAL DECOMP/DISCECTOMY FUSION N/A 09/21/2017   Procedure: ANTERIOR CERVICAL  DECOMPRESSION/DISCECTOMY FUSION - CERVICAL THREE-CERVICAL FOUR;  Surgeon: Kary Kos, MD;  Location: [facility];  Service: Neurosurgery;  Laterality: N/A;  . CARDIAC CATHETERIZATION N/A 08/31/2015   Procedure: Left Heart Cath and Coronary Angiography;  Surgeon: Leonie Man, MD;  Location: [facility];  Service: Cardiovascular;  Laterality: N/A;  . Carotid Dopplers Bilateral 02/20/2015   [facility]: Mild, less than 39% left and right internal carotid artery stenosis. No significant plaque burden  . CERVICAL DISC SURGERY     C5-7  . COLONOSCOPY    . COLONOSCOPY    . ESOPHAGOGASTRODUODENOSCOPY    . FASCIOTOMY Left 10/20/2013   Procedure: LEFT leg ANTERIOR COMPARTMENT FACSCIOTOMY;  Surgeon: Rozanna Box, MD;  Location: [facility];  Service: Orthopedics;  Laterality: Left;  . INGUINAL HERNIA REPAIR Left   . Nuclear Stress Test  03/06/2015   [facility]: Normal EKG. Low normal EF (49%) normal regional wall motion. No evidence of ischemia or infarction.  . ORIF TIBIA PLATEAU Left 10/20/2013   Procedure: OPEN REDUCTION INTERNAL FIXATION (ORIF) LEFT TIBIAL PLATEAU;  Surgeon: Rozanna Box, MD;  Location: [place];  Service: Orthopedics;  Laterality: Left;  . SEPTOPLASTY    . SPINAL CORD STIMULATOR BATTERY EXCHANGE N/A 02/08/2016   Procedure: Lumbar spinal cord stimulator implantable pulse generator replacement;  Surgeon: Clydell Hakim, MD;  Location: [place];  Service: Neurosurgery;  Laterality: N/A;  . SPINAL CORD STIMULATOR IMPLANT    . TRANSTHORACIC ECHOCARDIOGRAM  02/20/2015   [facility]: Mild concentric LVH. EF 55-60%. Normal regional wall motion. Mild to moderate TR with no significant pulmonary hypertension.        Home Medications    Prior to Admission medications   Medication Sig Start Date End Date Taking? Authorizing Provider  amitriptyline (ELAVIL) 10 MG tablet Take 2 tablets (20 mg total) by mouth at bedtime. For depression Patient not taking:  Reported on 09/09/2017 10/30/16   Hughie Closs A, NP  amitriptyline (ELAVIL) 25 MG tablet Take 75 mg by mouth at bedtime.    [provider]  aspirin EC 81 MG tablet Take 1 tablet (81 mg total) by mouth daily. For hearth health 10/30/16   Hughie Closs A, NP  buPROPion (WELLBUTRIN XL) 150 MG 24 hr tablet Take 150 mg by mouth daily.    [provider]  busPIRone (BUSPAR) 15 MG tablet Take 1 tablet (15 mg total) by mouth 2 (two) times daily. For anxiety 10/30/16   Hughie Closs A, NP  cyclobenzaprine (FLEXERIL) 10 MG tablet Take 1 tablet (10 mg total) by mouth 3 (three) times daily as needed for muscle spasms. 09/22/17   Meyran, Ocie Cornfield, NP  esomeprazole (NEXIUM) 40 MG capsule Take 1 capsule (40 mg total) by mouth daily at 12 noon. For indigestion Patient taking differently: Take 40 mg by mouth daily. For indigestion 10/30/16   Hughie Closs A, NP  hydrOXYzine (ATARAX/VISTARIL) 25 MG tablet Take 1 tablet (25 mg total) by  mouth every 8 (eight) hours as needed for anxiety. For anxiety Patient not taking: Reported on 09/09/2017 10/30/16   Hughie Closs A, NP  hydrOXYzine (ATARAX/VISTARIL) 50 MG tablet Take 50 mg by mouth 3 (three) times daily.    [provider]  lamoTRIgine (LAMICTAL) 100 MG tablet Take 50 mg by mouth 2 (two) times daily.    [provider]  lamoTRIgine (LAMICTAL) 25 MG tablet Take 1 tablet (25 mgs) in the morning & 2 tablets (50 mgs) at bedtime: For mood stabilization Patient not taking: Reported on 09/09/2017 10/30/16   Hughie Closs A, NP  levothyroxine (SYNTHROID, LEVOTHROID) 50 MCG tablet Take 50 mcg by mouth daily before breakfast.    [provider]  nicotine (NICODERM CQ - DOSED IN MG/24 HOURS) 14 mg/24hr patch Place 1 patch (14 mg total) onto the skin daily. For smoking ceasation 10/31/16   Hughie Closs A, NP  nicotine polacrilex (COMMIT) 4 MG lozenge Take 4 mg by mouth as needed for smoking cessation.    [provider]  nitroGLYCERIN (NITROSTAT) 0.4 MG SL tablet Place 0.4 mg under the tongue every 5 (five) minutes as needed for chest pain.    [provider]  OLANZapine (ZYPREXA) 15 MG tablet Take 1 tablet (15 mg total) by mouth at bedtime. For bipolar 10/30/16   Hughie Closs A, NP  Omega-3 Fatty Acids (FISH OIL PO) Take 2,000 mg by mouth 2 (two) times daily.    [provider]  oxyCODONE (ROXICODONE) 15 MG immediate release tablet Take 15 mg by mouth 3 (three) times daily.    [provider]  promethazine (PHENERGAN) 25 MG tablet Take 25 mg by mouth every 6 (six) hours as needed for nausea or vomiting.    [provider]  Suvorexant (BELSOMRA) 10 MG TABS Take 10 mg by mouth at bedtime.     [provider]  Vitamin D, Ergocalciferol, (DRISDOL) 50000 units CAPS capsule Take 50,000 Units by mouth every Thursday.    [provider]    Family History Family History  Problem Relation Age of Onset  . Heart disease Mother   . Hypertension Mother   . Sudden death Mother        Presumably cardiac  . Hyperlipidemia Father   . Heart attack Father 7       At least 5 MIs. Had CABG.  . Heart failure Father   . Diabetes Father   . Hypertension Father   . Kidney disease Father   . Diabetes Brother   . Lung cancer Maternal Grandmother   . Diabetes Paternal Grandmother   . Sudden death Paternal Grandmother        Unclear etiology  . Diabetes Unknown   . Kidney disease Unknown   . Kidney disease Brother   . Hypertension Brother   . Sudden death Brother        Thought to be related to heart disease  . Throat cancer Brother   . Colon cancer Neg Hx   . Mental illness Neg Hx     Social History Social History   Tobacco Use  . Smoking status: Former Smoker    Packs/day: 1.00    Years: 35.00    Pack years: 35.00    Types: Cigarettes    Last attempt to quit: 08/14/2017    Years since quitting: 0.1  . Smokeless tobacco: Never Used  .  Tobacco comment: 2018  Substance Use Topics  . Alcohol use: No  Alcohol/week: 0.0 oz    Comment: none since 1999- heavy drinker in past  . Drug use: Yes    Types: Oxycodone, Cocaine    Comment: rarely- last 2015-     Allergies   Zolpidem tartrate; Lyrica [pregabalin]; and Demerol [meperidine]   Review of Systems Review of Systems  Constitutional: Negative for chills and fever.  Respiratory: Negative for shortness of breath.   Cardiovascular: Negative for chest pain.  Gastrointestinal: Negative for abdominal pain, constipation, diarrhea, nausea and vomiting.  Genitourinary: Negative for dysuria and hematuria.  Musculoskeletal: Negative for arthralgias and myalgias.  Skin: Negative for color change.  Allergic/Immunologic: Negative for immunocompromised state.  Neurological: Negative for weakness and numbness.  Psychiatric/Behavioral: Positive for hallucinations. Negative for confusion and suicidal ideas.   All other systems reviewed and are negative for acute change except as noted in the HPI.    Physical Exam Updated Vital Signs BP 121/87 (BP Location: Right Arm)   Pulse (!) 111   Temp 98.4 F (36.9 C) (Oral)   Resp 20   Ht 6' (1.829 m)   Wt 97.1 kg (214 lb)   SpO2 98%   BMI 29.02 kg/m   Physical Exam  Constitutional: He is oriented to person, place, and time. Vital signs are normal. He appears well-developed and well-nourished.  Non-toxic appearance. No distress.  Afebrile, nontoxic, NAD  HENT:  Head: Normocephalic and atraumatic.  Mouth/Throat: Oropharynx is clear and moist and mucous membranes are normal.  Eyes: Conjunctivae and EOM are normal. Right eye exhibits no discharge. Left eye exhibits no discharge.  Neck:  Soft collar in place due to recent surgery  Cardiovascular: Regular rhythm, normal heart sounds and intact distal pulses. Tachycardia present. Exam reveals no gallop and no friction rub.  No murmur heard. Mildly tachycardic in the low 100-110s,  similar to prior visits  Pulmonary/Chest: Effort normal and breath sounds normal. No respiratory distress. He has no decreased breath sounds. He has no wheezes. He has no rhonchi. He has no rales.  Abdominal: Soft. Normal appearance and bowel sounds are normal. He exhibits no distension. There is no tenderness. There is no rigidity, no rebound, no guarding, no CVA tenderness, no tenderness at McBurney's point and negative Murphy's sign.  Musculoskeletal: Normal range of motion.  Neurological: He is alert and oriented to person, place, and time. He has normal strength. No sensory deficit.  Skin: Skin is warm, dry and intact. No rash noted.  Psychiatric: His mood appears anxious. He is actively hallucinating. He expresses no homicidal and no suicidal ideation. He expresses no suicidal plans and no homicidal plans.  Anxious affect, but pleasant and cooperative. Endorsing AVH, denies SI or HI, doesn't seem to be responding to internal stimuli.   Nursing note and vitals reviewed.    ED Treatments / Results  Labs (all labs ordered are listed, but only abnormal results are displayed) Labs Reviewed  COMPREHENSIVE METABOLIC PANEL - Abnormal; Notable for the following components:      Result Value   CO2 20 (*)    Glucose, Bld 102 (*)    BUN 23 (*)    Total Protein 8.3 (*)    All other components within normal limits  ACETAMINOPHEN LEVEL - Abnormal; Notable for the following components:   Acetaminophen (Tylenol), Serum <10 (*)    All other components within normal limits  CBC - Abnormal; Notable for the following components:   WBC 12.9 (*)    Platelets 457 (*)    All other components  within normal limits  RAPID URINE DRUG SCREEN, HOSP PERFORMED - Abnormal; Notable for the following components:   Opiates POSITIVE (*)    Benzodiazepines POSITIVE (*)    All other components within normal limits  ETHANOL  SALICYLATE LEVEL    EKG None  Radiology No results found.  Procedures Procedures  (including critical care time)  Medications Ordered in ED Medications  amitriptyline (ELAVIL) tablet 75 mg (has no administration in time range)  aspirin EC tablet 81 mg (has no administration in time range)  buPROPion (WELLBUTRIN XL) 24 hr tablet 150 mg (has no administration in time range)  busPIRone (BUSPAR) tablet 15 mg (has no administration in time range)  cyclobenzaprine (FLEXERIL) tablet 10 mg (has no administration in time range)  hydrOXYzine (ATARAX/VISTARIL) tablet 50 mg (has no administration in time range)  lamoTRIgine (LAMICTAL) tablet 50 mg (has no administration in time range)  levothyroxine (SYNTHROID, LEVOTHROID) tablet 50 mcg (has no administration in time range)  nitroGLYCERIN (NITROSTAT) SL tablet 0.4 mg (has no administration in time range)  OLANZapine (ZYPREXA) tablet 15 mg (has no administration in time range)  oxyCODONE (Oxy IR/ROXICODONE) immediate release tablet 15 mg (has no administration in time range)  promethazine (PHENERGAN) tablet 25 mg (has no administration in time range)  Suvorexant TABS 10 mg (has no administration in time range)  Vitamin D (Ergocalciferol) (DRISDOL) capsule 50,000 Units (has no administration in time range)  pantoprazole (PROTONIX) EC tablet 40 mg (has no administration in time range)  acetaminophen (TYLENOL) tablet 650 mg (has no administration in time range)  ondansetron (ZOFRAN) tablet 4 mg (has no administration in time range)  alum & mag hydroxide-simeth (MAALOX/MYLANTA) 200-200-20 MG/5ML suspension 30 mL (has no administration in time range)  nicotine (NICODERM CQ - dosed in mg/24 hours) patch 21 mg (has no administration in time range)     Initial Impression / Assessment and Plan / ED Course  I have reviewed the triage vital signs and the nursing notes.  Pertinent labs & imaging results that were available during my care of the patient were reviewed by me and considered in my medical decision making (see chart for  details).     52 y.o. male here with hallucinations, hearing voices telling him to hurt himself, and seeing people. Denies SI/HI/drug use/EtOH use. +Tobacco user, smoking cessation was advised and encouraged. No other complaints. Physical exam benign aside from being anxious appearing, mildly tachycardic which appears to be a chronic issue; soft collar on from recent surgery. Will get psych clearance labs and reassess.   10:04 PM CBC with mild leukocytosis 12.9 likely from recent surgery (or could be chronic, given that he has had this in the past to similar degree); also with chronic mild thrombocytosis which is unchanged from prior. CMP with mildly low bicarb 20 which appears chronic, otherwise fairly unremarkable. EtOH level undetectable. Salicylate and acetaminophen levels WNL. UDS with +opiates and +benzos, otherwise negative. Pt medically cleared at this time. Psych hold orders and home med orders placed. Please see TTS notes for further documentation of care/dispo. PLEASE NOTE THAT PT IS HERE VOLUNTARILY AT THIS TIME, IF PT TRIES TO LEAVE THEY WOULD NEED REPEAT EVAL TO SEE IF HE NEEDED IVC PAPERWORK TAKEN OUT. Pt stable at time of med clearance.     Final Clinical Impressions(s) / ED Diagnoses   Final diagnoses:  Hallucinations  Tobacco user  Medical clearance for psychiatric admission  Thrombocytosis [facility])    ED Discharge Orders    None  [address] [place], [place] 10/03/17 2204    Fredia Sorrow, MD 10/05/17 [phone]

## 2017-10-03 NOTE — Progress Notes (Signed)
TTS consulted with Lindon Romp, NP who recommends inpt treatment. EDP. Dr. Rogene Houston, MD notified of the disposition. Pt's nurse Markus Daft, RN aware of the recommendation.  Lind Covert, MSW, LCSW Therapeutic Triage Specialist  [phone]

## 2017-10-04 ENCOUNTER — Inpatient Hospital Stay (HOSPITAL_COMMUNITY)
Admission: AD | Admit: 2017-10-04 | Discharge: 2017-10-08 | DRG: 885 | Disposition: A | Discharge status: Home / Self Care | Payer: 59 | Source: Intra-hospital | Attending: Psychiatry | Admitting: Psychiatry

## 2017-10-04 ENCOUNTER — Encounter (HOSPITAL_COMMUNITY): Payer: Self-pay

## 2017-10-04 DIAGNOSIS — I129 Hypertensive chronic kidney disease with stage 1 through stage 4 chronic kidney disease, or unspecified chronic kidney disease: Secondary | ICD-10-CM | POA: Diagnosis present

## 2017-10-04 DIAGNOSIS — Z8249 Family history of ischemic heart disease and other diseases of the circulatory system: Secondary | ICD-10-CM | POA: Diagnosis not present

## 2017-10-04 DIAGNOSIS — Z87891 Personal history of nicotine dependence: Secondary | ICD-10-CM | POA: Diagnosis not present

## 2017-10-04 DIAGNOSIS — M797 Fibromyalgia: Secondary | ICD-10-CM | POA: Diagnosis present

## 2017-10-04 DIAGNOSIS — K219 Gastro-esophageal reflux disease without esophagitis: Secondary | ICD-10-CM | POA: Diagnosis present

## 2017-10-04 DIAGNOSIS — F1410 Cocaine abuse, uncomplicated: Secondary | ICD-10-CM | POA: Diagnosis not present

## 2017-10-04 DIAGNOSIS — Z7982 Long term (current) use of aspirin: Secondary | ICD-10-CM | POA: Diagnosis not present

## 2017-10-04 DIAGNOSIS — F3164 Bipolar disorder, current episode mixed, severe, with psychotic features: Secondary | ICD-10-CM | POA: Diagnosis not present

## 2017-10-04 DIAGNOSIS — D473 Essential (hemorrhagic) thrombocythemia: Secondary | ICD-10-CM | POA: Diagnosis not present

## 2017-10-04 DIAGNOSIS — F1210 Cannabis abuse, uncomplicated: Secondary | ICD-10-CM | POA: Diagnosis not present

## 2017-10-04 DIAGNOSIS — K589 Irritable bowel syndrome without diarrhea: Secondary | ICD-10-CM | POA: Diagnosis present

## 2017-10-04 DIAGNOSIS — E039 Hypothyroidism, unspecified: Secondary | ICD-10-CM | POA: Diagnosis not present

## 2017-10-04 DIAGNOSIS — F209 Schizophrenia, unspecified: Secondary | ICD-10-CM | POA: Diagnosis not present

## 2017-10-04 DIAGNOSIS — M549 Dorsalgia, unspecified: Secondary | ICD-10-CM | POA: Diagnosis present

## 2017-10-04 DIAGNOSIS — M8580 Other specified disorders of bone density and structure, unspecified site: Secondary | ICD-10-CM | POA: Diagnosis present

## 2017-10-04 DIAGNOSIS — N189 Chronic kidney disease, unspecified: Secondary | ICD-10-CM | POA: Diagnosis present

## 2017-10-04 DIAGNOSIS — F3110 Bipolar disorder, current episode manic without psychotic features, unspecified: Secondary | ICD-10-CM | POA: Diagnosis present

## 2017-10-04 DIAGNOSIS — F419 Anxiety disorder, unspecified: Secondary | ICD-10-CM | POA: Diagnosis not present

## 2017-10-04 DIAGNOSIS — E785 Hyperlipidemia, unspecified: Secondary | ICD-10-CM | POA: Diagnosis present

## 2017-10-04 DIAGNOSIS — G894 Chronic pain syndrome: Secondary | ICD-10-CM | POA: Diagnosis present

## 2017-10-04 DIAGNOSIS — F319 Bipolar disorder, unspecified: Secondary | ICD-10-CM | POA: Diagnosis not present

## 2017-10-04 DIAGNOSIS — F315 Bipolar disorder, current episode depressed, severe, with psychotic features: Principal | ICD-10-CM | POA: Diagnosis present

## 2017-10-04 DIAGNOSIS — G4700 Insomnia, unspecified: Secondary | ICD-10-CM | POA: Diagnosis not present

## 2017-10-04 DIAGNOSIS — R296 Repeated falls: Secondary | ICD-10-CM | POA: Diagnosis present

## 2017-10-04 MED ORDER — ALUM & MAG HYDROXIDE-SIMETH 200-200-20 MG/5ML PO SUSP: 30.0000 mL | ORAL | Status: DC | PRN

## 2017-10-04 MED ORDER — SUVOREXANT 10 MG PO TABS: 10.0000 mg | ORAL_TABLET | Freq: Every day | ORAL | Status: DC

## 2017-10-04 MED ORDER — BUPROPION HCL ER (XL) 150 MG PO TB24
150.0000 mg | ORAL_TABLET | Freq: Every day | ORAL | Status: DC
  Administered 2017-10-05 – 2017-10-08 (×4): 150 mg via ORAL
  Filled 2017-10-04 (×7): qty 1

## 2017-10-04 MED ORDER — OLANZAPINE 7.5 MG PO TABS
15.0000 mg | ORAL_TABLET | Freq: Every day | ORAL | Status: DC
  Administered 2017-10-04: 15 mg via ORAL
  Filled 2017-10-04 (×3): qty 2

## 2017-10-04 MED ORDER — BUSPIRONE HCL 15 MG PO TABS
ORAL_TABLET | ORAL | Status: AC
  Filled 2017-10-04: qty 1

## 2017-10-04 MED ORDER — VITAMIN D (ERGOCALCIFEROL) 1.25 MG (50000 UNIT) PO CAPS
50000.0000 [IU] | ORAL_CAPSULE | ORAL | Status: DC
  Administered 2017-10-08: 50000 [IU] via ORAL
  Filled 2017-10-04: qty 1

## 2017-10-04 MED ORDER — PROMETHAZINE HCL 25 MG PO TABS
25.0000 mg | ORAL_TABLET | Freq: Four times a day (QID) | ORAL | Status: DC | PRN
Start: 2017-10-04 — End: 2017-10-04

## 2017-10-04 MED ORDER — LEVOTHYROXINE SODIUM 50 MCG PO TABS
50.0000 ug | ORAL_TABLET | Freq: Every day | ORAL | Status: DC
  Administered 2017-10-05 – 2017-10-08 (×4): 50 ug via ORAL
  Filled 2017-10-04 (×5): qty 1
  Filled 2017-10-04: qty 2
  Filled 2017-10-04: qty 1

## 2017-10-04 MED ORDER — PROMETHAZINE HCL 25 MG PO TABS: 25.0000 mg | ORAL_TABLET | Freq: Four times a day (QID) | ORAL | Status: DC | PRN

## 2017-10-04 MED ORDER — LAMOTRIGINE 25 MG PO TABS
ORAL_TABLET | ORAL | Status: AC
  Filled 2017-10-04: qty 2

## 2017-10-04 MED ORDER — ONDANSETRON HCL 4 MG PO TABS: 4.0000 mg | ORAL_TABLET | Freq: Three times a day (TID) | ORAL | Status: DC | PRN

## 2017-10-04 MED ORDER — PANTOPRAZOLE SODIUM 40 MG PO TBEC
40.0000 mg | DELAYED_RELEASE_TABLET | Freq: Every day | ORAL | Status: DC
  Administered 2017-10-05 – 2017-10-08 (×4): 40 mg via ORAL
  Filled 2017-10-04 (×7): qty 1

## 2017-10-04 MED ORDER — CYCLOBENZAPRINE HCL 10 MG PO TABS
10.0000 mg | ORAL_TABLET | Freq: Three times a day (TID) | ORAL | Status: DC | PRN
  Administered 2017-10-04 – 2017-10-07 (×3): 10 mg via ORAL
  Filled 2017-10-04 (×4): qty 2

## 2017-10-04 MED ORDER — HYDROXYZINE HCL 50 MG PO TABS
50.0000 mg | ORAL_TABLET | Freq: Three times a day (TID) | ORAL | Status: DC
  Administered 2017-10-05 – 2017-10-08 (×11): 50 mg via ORAL
  Filled 2017-10-04 (×18): qty 1

## 2017-10-04 MED ORDER — BUSPIRONE HCL 15 MG PO TABS
15.0000 mg | ORAL_TABLET | Freq: Two times a day (BID) | ORAL | Status: DC
  Administered 2017-10-04 – 2017-10-08 (×8): 15 mg via ORAL
  Filled 2017-10-04 (×13): qty 1

## 2017-10-04 MED ORDER — NITROGLYCERIN 0.4 MG SL SUBL: 0.4000 mg | SUBLINGUAL_TABLET | SUBLINGUAL | Status: DC | PRN

## 2017-10-04 MED ORDER — OXYCODONE HCL 5 MG PO TABS
15.0000 mg | ORAL_TABLET | Freq: Four times a day (QID) | ORAL | Status: DC
Start: 2017-10-04 — End: 2017-10-04

## 2017-10-04 MED ORDER — NICOTINE 14 MG/24HR TD PT24
14.0000 mg | MEDICATED_PATCH | Freq: Every day | TRANSDERMAL | Status: DC
  Administered 2017-10-05 – 2017-10-08 (×4): 14 mg via TRANSDERMAL
  Filled 2017-10-04 (×6): qty 1

## 2017-10-04 MED ORDER — LAMOTRIGINE 25 MG PO TABS
50.0000 mg | ORAL_TABLET | Freq: Two times a day (BID) | ORAL | Status: DC
  Administered 2017-10-04 – 2017-10-08 (×8): 50 mg via ORAL
  Filled 2017-10-04 (×13): qty 2

## 2017-10-04 MED ORDER — NICOTINE 21 MG/24HR TD PT24
21.0000 mg | MEDICATED_PATCH | Freq: Every day | TRANSDERMAL | Status: DC
  Filled 2017-10-04: qty 1

## 2017-10-04 MED ORDER — AMITRIPTYLINE HCL 75 MG PO TABS
75.0000 mg | ORAL_TABLET | Freq: Every day | ORAL | Status: DC
  Administered 2017-10-04 – 2017-10-07 (×4): 75 mg via ORAL
  Filled 2017-10-04 (×6): qty 1
  Filled 2017-10-04 (×2): qty 3

## 2017-10-04 MED ORDER — ACETAMINOPHEN 325 MG PO TABS: 650.0000 mg | ORAL_TABLET | Freq: Four times a day (QID) | ORAL | Status: DC | PRN

## 2017-10-04 MED ORDER — OXYCODONE HCL 5 MG PO TABS
ORAL_TABLET | ORAL | Status: AC
  Filled 2017-10-04: qty 3

## 2017-10-04 MED ORDER — OXYCODONE HCL 5 MG PO TABS
15.0000 mg | ORAL_TABLET | Freq: Four times a day (QID) | ORAL | Status: DC
  Administered 2017-10-04 – 2017-10-05 (×3): 15 mg via ORAL
  Filled 2017-10-04 (×2): qty 3

## 2017-10-04 MED ORDER — ASPIRIN EC 81 MG PO TBEC
81.0000 mg | DELAYED_RELEASE_TABLET | Freq: Every day | ORAL | Status: DC
  Administered 2017-10-05 – 2017-10-08 (×4): 81 mg via ORAL
  Filled 2017-10-04 (×7): qty 1

## 2017-10-04 MED ORDER — ALUM & MAG HYDROXIDE-SIMETH 200-200-20 MG/5ML PO SUSP: 30.0000 mL | Freq: Four times a day (QID) | ORAL | Status: DC | PRN

## 2017-10-04 MED ORDER — ACETAMINOPHEN 325 MG PO TABS: 650.0000 mg | ORAL_TABLET | ORAL | Status: DC | PRN

## 2017-10-04 MED ORDER — MAGNESIUM HYDROXIDE 400 MG/5ML PO SUSP: 30.0000 mL | Freq: Every day | ORAL | Status: DC | PRN

## 2017-10-04 NOTE — Progress Notes (Signed)
Patient ID: [name], male   DOB: 09-Mar-1966, 52 y.o.   MRN: [id number]  Admission Note  D) Patient admitted to the adult unit. Patient is a 52 year old male who is Voluntary and was in no acute distress. Patient presents with anxious mood but was pleasant and cooperative during the admission process. Patient reports he is hearing command voices to kill himself and sees "8-9 people" in the search room. Patient with soft brace to his neck after recent surgery. Patient reports pain 8/10. Patient also reports pain to his L knee and states he uses a cane at home. Patient provided front wheel walker while at [facility]. Patient reports his stressors are his physical complications and paying bills. Patient identified his daughter and his ex-wife as a positive support system. While here, patient reports wanting to work on "stopping smoking" and "getting my head straightened out".   A) Skin assessment was completed and unremarkable except for healing scars to his L neck and healed scars to his L knee. Patient belongings searched with no contraband found. Belongings in locker #30. Plan of care, unit policies and patient expectations were explained. Patient receptive to information given with no questions. Patient verbalized understanding and contracted for safety on the unit. Written consents obtained. Vital signs obtained and WNL. Snacks and fluids provided, meal tray offered. Patient oriented to the unit and their room. Patient placed on standard q15 safety checks. High fall risk precautions initiated and reviewed with patient; patient verbalized understanding. Patient with order for soft neck brace and to ambulate with walker.  R) Patient is in no acute distress. Patient remains safe on the unit at this time. Patient without questions or concerns at this time. Will continue to monitor.

## 2017-10-04 NOTE — Consult Note (Addendum)
[facility] Face-to-Face Psychiatry Consult   Reason for Consult:  Depression with mild hallucinations Referring Physician:  EDP Patient Identification: CALLAWAY HAILES MRN:  [id number] Principal Diagnosis: Bipolar disorder, curr episode mixed, severe, with psychotic features [facility]) Diagnosis:   Patient Active Problem List   Diagnosis Date Noted  . Bipolar disorder, curr episode mixed, severe, with psychotic features ([place]) [F31.64] 12/26/2015    Priority: High  . Spinal stenosis of cervical region [M48.02] 09/21/2017  . Chronic pain syndrome [G89.4] 10/28/2016  . Cannabis use disorder, mild, abuse [F12.10] 10/28/2016  . Rhabdomyolysis [M62.82] 08/23/2016  . AKI (acute kidney injury) ([place]) [N17.9] 08/23/2016  . Transaminitis [R74.0] 08/23/2016  . Drug overdose, multiple drugs [T50.901A] 08/23/2016  . Tachycardia [R00.0] 08/23/2016  . Accelerated hypertension [I10] 08/23/2016  . Cocaine use disorder, mild, abuse ([place]) [F14.10] 12/26/2015  . History of rhabdomyolysis [Z87.39] 12/26/2015  . Liver injury [S36.119A] 12/26/2015  . Hx of renal failure [Z87.448] 12/26/2015  . Angina, class III ([place]) [I20.9] 08/18/2015  . Dyslipidemia, goal LDL below 70 [E78.5] 08/18/2015  . Family history of premature coronary artery disease [Z82.49] 08/18/2015  . Tobacco use disorder, continuous [F17.209] 08/18/2015  . Tibial plateau fracture [S82.143A] 10/02/2013  . Multiple falls [R29.6] 10/02/2013  . Alteration in self-care ability [R53.81] 10/02/2013  . 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 Fracture of tibia with fibula, left, closed [S82.202A, S82.402A] 10/02/2013  . Chronic back pain [M54.9, G89.29] 10/02/2013  . 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 History of suicidal ideation [Z86.59] 10/02/2013  . Postural imbalance with levoscoliosis, h/o [R29.3] 10/02/2013  . Degenerative disc disease, lumbar [M51.36] 09/07/2013  . Chest pain [R07.9] 12/25/2012  . Leg  pain [M79.606] 02/24/2011  . Neck pain [M54.2] 02/24/2011  . Right hip pain [M25.551] 02/24/2011  . IRRITABLE BOWEL SYNDROME [K58.9] 08/09/2008  . HYPERLIPIDEMIA [E78.5] 10/28/2006  . DISORDER, BIPOLAR NOS [F31.9] 10/28/2006  . ANXIETY [F41.1] 10/28/2006  . DEPRESSION [F32.9] 10/28/2006  . PAIN, CHRONIC NEC [G89.29] 10/28/2006  . GERD [K21.9] 10/28/2006    Total Time spent with patient: 45 minutes  Subjective:   [name] is a 52 y.o. male patient admitted with depression and hallucinations.  HPI:  52 yo male who presented to the ED after having an increase in depression with hallucinations.  He is hearing people yelling at each other, some are telling him to kill himself and some are telling him not to.  He had surgery on Monday for his neck and is doing well recovering but more depressed.  His exwife is supportive.  He sees Dr. Darleene Cleaver outpatient on a regular basis.  No homicidal ideations or substance abuse.  Past Psychiatric History: bipolar disorder  Risk to Self: Suicidal Ideation: No Suicidal Intent: No Is patient at risk for suicide?: No Suicidal Plan?: No Access to Means: No What has been your use of drugs/alcohol within the last 12 months?: denies use, states he stopped drinking alcohol in 1999 Triggers for Past Attempts: None known Intentional Self Injurious Behavior: None Risk to Others: Homicidal Ideation: No Thoughts of Harm to Others: No Current Homicidal Intent: No Current Homicidal Plan: No Access to Homicidal Means: No History of harm to others?: No Assessment of Violence: None Noted Does patient have access to weapons?: No Criminal Charges Pending?: No Does patient have a court date: No Prior Inpatient Therapy: Prior Inpatient Therapy: Yes Prior Therapy Dates: 2010, 2012, 2017, 2018 Prior Therapy Facilty/Provider(s): [facility] Reason for Treatment: BIPOLAR Prior Outpatient Therapy: Prior Outpatient  Therapy: Yes Prior Therapy Dates: CURRENT Prior Therapy  Facilty/Provider(s): DR. Darleene Cleaver Reason for Treatment: MED MANAGEMENT Does patient have an ACCT team?: No Does patient have Intensive In-House Services?  : No Does patient have [facility] services? : No Does patient have P4CC services?: No  Past Medical History:  Past Medical History:  Diagnosis Date  . Accelerated hypertension 08/23/2016   pt denies this  . Anxiety   . Arthritis   . Bipolar affective disorder ([place])   . Chronic back pain   . Chronic kidney disease 2017   AKF - due to Rhabdomyolysis  . DDD (degenerative disc disease)   . Depression   . DJD (degenerative joint disease)   . Elevated liver function tests   . Fibromyalgia   . GERD (gastroesophageal reflux disease)   . Head injury   . Head injury, closed, with concussion    x 3  from falls  . Hyperlipidemia   . Hypothyroidism   . IBS (irritable bowel syndrome)    with diarrhea  . Inguinal hernia    left  . Insomnia   . Myofascial pain syndrome   . Osteopenia   . Pneumonia   . Shortness of breath dyspnea    with exertion  . Tibial plateau fracture, left   . Tremors of nervous system     Past Surgical History:  Procedure Laterality Date  . ANTERIOR CERVICAL DECOMP/DISCECTOMY FUSION N/A 09/21/2017   Procedure: ANTERIOR CERVICAL DECOMPRESSION/DISCECTOMY FUSION - CERVICAL THREE-CERVICAL FOUR;  Surgeon: Kary Kos, MD;  Location: [place];  Service: Neurosurgery;  Laterality: N/A;  . CARDIAC CATHETERIZATION N/A 08/31/2015   Procedure: Left Heart Cath and Coronary Angiography;  Surgeon: Leonie Man, MD;  Location: [place] [facility];  Service: Cardiovascular;  Laterality: N/A;  . Carotid Dopplers Bilateral 02/20/2015   [facility]: Mild, less than 39% left and right internal carotid artery stenosis. No significant plaque burden  . CERVICAL DISC SURGERY     C5-7  . COLONOSCOPY    . COLONOSCOPY    . ESOPHAGOGASTRODUODENOSCOPY    . FASCIOTOMY Left 10/20/2013   Procedure: LEFT leg ANTERIOR COMPARTMENT  FACSCIOTOMY;  Surgeon: Rozanna Box, MD;  Location: [place];  Service: Orthopedics;  Laterality: Left;  . INGUINAL HERNIA REPAIR Left   . Nuclear Stress Test  03/06/2015   [facility]: Normal EKG. Low normal EF (49%) normal regional wall motion. No evidence of ischemia or infarction.  . ORIF TIBIA PLATEAU Left 10/20/2013   Procedure: OPEN REDUCTION INTERNAL FIXATION (ORIF) LEFT TIBIAL PLATEAU;  Surgeon: Rozanna Box, MD;  Location: [place];  Service: Orthopedics;  Laterality: Left;  . SEPTOPLASTY    . SPINAL CORD STIMULATOR BATTERY EXCHANGE N/A 02/08/2016   Procedure: Lumbar spinal cord stimulator implantable pulse generator replacement;  Surgeon: Clydell Hakim, MD;  Location: [place] [facility];  Service: Neurosurgery;  Laterality: N/A;  . SPINAL CORD STIMULATOR IMPLANT    . TRANSTHORACIC ECHOCARDIOGRAM  02/20/2015   [facility]: Mild concentric LVH. EF 55-60%. Normal regional wall motion. Mild to moderate TR with no significant pulmonary hypertension.   Family History:  Family History  Problem Relation Age of Onset  . Heart disease Mother   . Hypertension Mother   . Sudden death Mother        Presumably cardiac  . Hyperlipidemia Father   . Heart attack Father 74       At least 5 MIs. Had CABG.  . Heart failure Father   .  Diabetes Father   . Hypertension Father   . Kidney disease Father   . Diabetes Brother   . Lung cancer Maternal Grandmother   . Diabetes Paternal Grandmother   . Sudden death Paternal Grandmother        Unclear etiology  . Diabetes Unknown   . Kidney disease Unknown   . Kidney disease Brother   . Hypertension Brother   . Sudden death Brother        Thought to be related to heart disease  . Throat cancer Brother   . Colon cancer Neg Hx   . Mental illness Neg Hx    Family Psychiatric  History: none Social History:  Social History   Substance and Sexual Activity  Alcohol Use No  . Alcohol/week: 0.0 oz   Comment: none since 1999- heavy  drinker in past     Social History   Substance and Sexual Activity  Drug Use Yes  . Types: Oxycodone, Cocaine   Comment: rarely- last 2015-    Social History   Socioeconomic History  . Marital status: Married    Spouse name: Not on file  . Number of children: 1  . Years of education: Not on file  . Highest education level: Not on file  Occupational History  . Occupation: disabled  Social Needs  . Financial resource strain: Not on file  . Food insecurity:    Worry: Not on file    Inability: Not on file  . Transportation needs:    Medical: Not on file    Non-medical: Not on file  Tobacco Use  . Smoking status: Former Smoker    Packs/day: 1.00    Years: 35.00    Pack years: 35.00    Types: Cigarettes    Last attempt to quit: 08/14/2017    Years since quitting: 0.1  . Smokeless tobacco: Never Used  . Tobacco comment: 2018  Substance and Sexual Activity  . Alcohol use: No    Alcohol/week: 0.0 oz    Comment: none since 1999- heavy drinker in past  . Drug use: Yes    Types: Oxycodone, Cocaine    Comment: rarely- last 2015-  . Sexual activity: Not on file  Lifestyle  . Physical activity:    Days per week: Not on file    Minutes per session: Not on file  . Stress: Not on file  Relationships  . Social connections:    Talks on phone: Not on file    Gets together: Not on file    Attends religious service: Not on file    Active member of club or organization: Not on file    Attends meetings of clubs or organizations: Not on file    Relationship status: Not on file  Other Topics Concern  . Not on file  Social History Narrative   He is a separated father of 1. His daughter who is in nursing school lives with him part-time.   He is currently disabled due to chronic back pain.    He did some college education.   He currently is trying to use electronic cigarettes but has smoked one pack a day for over 35 years.   He has recently stopped using prescription benzodiazepine  and pain medications because he is in the process of changing pain med doctors.   He does not routinely exercise secondary to back, neck pain as well as dyspnea.   Additional Social History:    Allergies:   Allergies  Allergen  Reactions  . Zolpidem Tartrate Other (See Comments)    Hallucinations and sleep walks  . Lyrica [Pregabalin] Other (See Comments)    Causes extreme depression  . Demerol [Meperidine] Itching, Rash and Hives    Labs:  Results for orders placed or performed during the hospital encounter of 10/03/17 (from the past 48 hour(s))  Comprehensive metabolic panel     Status: Abnormal   Collection Time: 10/03/17  9:08 PM  Result Value Ref Range   Sodium 137 135 - 145 mmol/L   Potassium 3.8 3.5 - 5.1 mmol/L   Chloride 105 101 - 111 mmol/L   CO2 20 (L) 22 - 32 mmol/L   Glucose, Bld 102 (H) 65 - 99 mg/dL   BUN 23 (H) 6 - 20 mg/dL   Creatinine, Ser 1.19 0.61 - 1.24 mg/dL   Calcium 10.1 8.9 - 10.3 mg/dL   Total Protein 8.3 (H) 6.5 - 8.1 g/dL   Albumin 4.3 3.5 - 5.0 g/dL   AST 20 15 - 41 U/L   ALT 19 17 - 63 U/L   Alkaline Phosphatase 92 38 - 126 U/L   Total Bilirubin 0.9 0.3 - 1.2 mg/dL   GFR calc non Af Amer >60 >60 mL/min   GFR calc Af Amer >60 >60 mL/min    Comment: (NOTE) The eGFR has been calculated using the CKD EPI equation. This calculation has not been validated in all clinical situations. eGFR's persistently <60 mL/min signify possible Chronic Kidney Disease.    Anion gap 12 5 - 15    Comment: Performed at [facility], [place] [address], [place], [place] [address]  Ethanol     Status: None   Collection Time: 10/03/17  9:08 PM  Result Value Ref Range   Alcohol, Ethyl (B) <10 <10 mg/dL    Comment:        LOWEST DETECTABLE LIMIT FOR SERUM ALCOHOL IS 10 mg/dL FOR MEDICAL PURPOSES ONLY Performed at [place] [address], [place], [place] [address]   Salicylate level     Status: None   Collection Time:  10/03/17  9:08 PM  Result Value Ref Range   Salicylate Lvl <6.9 2.8 - 30.0 mg/dL    Comment: Performed at [facility], [place] [address], [place], [place] [address]  Acetaminophen level     Status: Abnormal   Collection Time: 10/03/17  9:08 PM  Result Value Ref Range   Acetaminophen (Tylenol), Serum <10 (L) 10 - 30 ug/mL    Comment:        THERAPEUTIC CONCENTRATIONS VARY SIGNIFICANTLY. A RANGE OF 10-30 ug/mL MAY BE AN EFFECTIVE CONCENTRATION FOR MANY PATIENTS. HOWEVER, SOME ARE BEST TREATED AT CONCENTRATIONS OUTSIDE THIS RANGE. ACETAMINOPHEN CONCENTRATIONS >150 ug/mL AT 4 HOURS AFTER INGESTION AND >50 ug/mL AT 12 HOURS AFTER INGESTION ARE OFTEN ASSOCIATED WITH TOXIC REACTIONS. Performed at [facility], [place] [address] [place], [place] [address]   cbc     Status: Abnormal   Collection Time: 10/03/17  9:08 PM  Result Value Ref Range   WBC 12.9 (H) 4.0 - 10.5 K/uL   RBC 4.98 4.22 - 5.81 MIL/uL   Hemoglobin 14.7 13.0 - 17.0 g/dL   HCT 42.3 39.0 - 52.0 %   MCV 84.9 78.0 - 100.0 fL   MCH 29.5 26.0 - 34.0 pg   MCHC 34.8 30.0 - 36.0 g/dL   RDW 12.5 11.5 - 15.5 %   Platelets 457 (H) 150 - 400 K/uL  Comment: Performed at [facility], [place] [address] [place], [place] [address]  Rapid urine drug screen (hospital performed)     Status: Abnormal   Collection Time: 10/03/17  9:08 PM  Result Value Ref Range   Opiates POSITIVE (A) NONE DETECTED   Cocaine NONE DETECTED NONE DETECTED   Benzodiazepines POSITIVE (A) NONE DETECTED   Amphetamines NONE DETECTED NONE DETECTED   Tetrahydrocannabinol NONE DETECTED NONE DETECTED   Barbiturates NONE DETECTED NONE DETECTED    Comment: (NOTE) DRUG SCREEN FOR MEDICAL PURPOSES ONLY.  IF CONFIRMATION IS NEEDED FOR ANY PURPOSE, NOTIFY LAB WITHIN 5 DAYS. LOWEST DETECTABLE LIMITS FOR URINE DRUG SCREEN Drug Class                     Cutoff (ng/mL) Amphetamine and metabolites     1000 Barbiturate and metabolites    200 Benzodiazepine                 600 Tricyclics and metabolites     300 Opiates and metabolites        300 Cocaine and metabolites        300 THC                            50 Performed at [facility], [place] [address], [place], [place] [address]     Current Facility-Administered Medications  Medication Dose Route Frequency Provider Last Rate Last Dose  . acetaminophen (TYLENOL) tablet 650 mg  650 mg Oral Q4H PRN Street, [place], [place]   650 mg at 10/04/17 1214  . alum & mag hydroxide-simeth (MAALOX/MYLANTA) 200-200-20 MG/5ML suspension 30 mL  30 mL Oral Q6H PRN Street, [place], [place]      . amitriptyline (ELAVIL) tablet 75 mg  75 mg Oral QHS [address] [place], [place]   75 mg at 10/04/17 0018  . aspirin EC tablet 81 mg  81 mg Oral Daily [address], [place], [place]   81 mg at 10/04/17 1010  . buPROPion (WELLBUTRIN XL) 24 hr tablet 150 mg  150 mg Oral Daily [address], [place], [place]   150 mg at 10/04/17 1010  . busPIRone (BUSPAR) tablet 15 mg  15 mg Oral BID Street, [place], [place]   15 mg at 10/04/17 1010  . cyclobenzaprine (FLEXERIL) tablet 10 mg  10 mg Oral TID PRN Street, [place], [place]   10 mg at 10/04/17 1010  . hydrOXYzine (ATARAX/VISTARIL) tablet 50 mg  50 mg Oral TID Street, [place], [place]   50 mg at 10/04/17 1010  . lamoTRIgine (LAMICTAL) tablet 50 mg  50 mg Oral BID [address], [place], [place]   50 mg at 10/04/17 1010  . levothyroxine (SYNTHROID, LEVOTHROID) tablet 50 mcg  50 mcg Oral QAC breakfast [address] [place], [place]   50 mcg at 10/04/17 0710  . nicotine (NICODERM CQ - dosed in mg/24 hours) patch 21 mg  21 mg Transdermal Daily [address] [place], [place]   21 mg at 10/04/17 1010  . nitroGLYCERIN (NITROSTAT) SL tablet 0.4 mg  0.4 mg Sublingual Q5 min PRN Street, [place], [place]      . OLANZapine (ZYPREXA) tablet 15 mg  15 mg Oral QHS [address], [place], [place]   15 mg at 10/04/17 0018  . ondansetron (ZOFRAN) tablet 4 mg  4 mg Oral Q8H PRN Street,  [place], [place]      . oxyCODONE (Oxy IR/ROXICODONE) immediate release tablet 15 mg  15 mg Oral TID Street, [place], [place]  15 mg at 10/04/17 1010  . pantoprazole (PROTONIX) EC tablet 40 mg  40 mg Oral [address] [place], [place]   40 mg at 10/04/17 1010  . promethazine (PHENERGAN) tablet 25 mg  25 mg Oral Q6H PRN Street, [place], PA-C      . Suvorexant TABS 10 mg  10 mg Oral [address] [place], [place]      . [place] ON 10/08/2017] Vitamin D (Ergocalciferol) (DRISDOL) capsule 50,000 Units  50,000 Units Oral Q [address], [place], [place]       Current Outpatient Medications  Medication Sig Dispense Refill  . amitriptyline (ELAVIL) 25 MG tablet Take 75 mg by mouth at bedtime.    Marland Kitchen aspirin EC 81 MG tablet Take 1 tablet (81 mg total) by mouth daily. For hearth health    . buPROPion (WELLBUTRIN XL) 150 MG 24 hr tablet Take 150 mg by mouth daily.    . busPIRone (BUSPAR) 15 MG tablet Take 1 tablet (15 mg total) by mouth 2 (two) times daily. For anxiety 60 tablet 0  . cyclobenzaprine (FLEXERIL) 10 MG tablet Take 1 tablet (10 mg total) by mouth 3 (three) times daily as needed for muscle spasms. 30 tablet 0  . esomeprazole (NEXIUM) 40 MG capsule Take 1 capsule (40 mg total) by mouth daily at 12 noon. For indigestion (Patient taking differently: Take 40 mg by mouth daily. For indigestion) 30 capsule 0  . hydrOXYzine (ATARAX/VISTARIL) 50 MG tablet Take 50 mg by mouth 3 (three) times daily.    Marland Kitchen lamoTRIgine (LAMICTAL) 100 MG tablet Take 50 mg by mouth 2 (two) times daily.    Marland Kitchen levothyroxine (SYNTHROID, LEVOTHROID) 50 MCG tablet Take 50 mcg by mouth daily before breakfast.    . nicotine (NICODERM CQ - DOSED IN MG/24 HOURS) 14 mg/24hr patch Place 1 patch (14 mg total) onto the skin daily. For smoking ceasation 28 patch 0  . nicotine polacrilex (COMMIT) 4 MG lozenge Take 4 mg by mouth as needed for smoking cessation.    . nitroGLYCERIN (NITROSTAT) 0.4 MG SL tablet Place 0.4 mg under the tongue every 5 (five)  minutes as needed for chest pain.    Marland Kitchen OLANZapine (ZYPREXA) 15 MG tablet Take 1 tablet (15 mg total) by mouth at bedtime. For bipolar 30 tablet 0  . Omega-3 Fatty Acids (FISH OIL PO) Take 2,000 mg by mouth 2 (two) times daily.    Marland Kitchen oxyCODONE (ROXICODONE) 15 MG immediate release tablet Take 15 mg by mouth 3 (three) times daily.    . promethazine (PHENERGAN) 25 MG tablet Take 25 mg by mouth every 6 (six) hours as needed for nausea or vomiting.    . Suvorexant (BELSOMRA) 10 MG TABS Take 10 mg by mouth at bedtime.     . Vitamin D, Ergocalciferol, (DRISDOL) 50000 units CAPS capsule Take 50,000 Units by mouth every Thursday.    Marland Kitchen amitriptyline (ELAVIL) 10 MG tablet Take 2 tablets (20 mg total) by mouth at bedtime. For depression (Patient not taking: Reported on 09/09/2017) 30 tablet 0  . hydrOXYzine (ATARAX/VISTARIL) 25 MG tablet Take 1 tablet (25 mg total) by mouth every 8 (eight) hours as needed for anxiety. For anxiety (Patient not taking: Reported on 09/09/2017) 75 tablet 0  . lamoTRIgine (LAMICTAL) 25 MG tablet Take 1 tablet (25 mgs) in the morning & 2 tablets (50 mgs) at bedtime: For mood stabilization (Patient not taking: Reported on 09/09/2017) 90 tablet 0    Musculoskeletal: Strength & Muscle Tone: within normal limits Gait &  Station: normal Patient leans: N/A  Psychiatric Specialty Exam: Physical Exam  Constitutional: He is oriented to person, place, and time. He appears well-developed and well-nourished.  HENT:  Head: Normocephalic.  Neck: Normal range of motion.  Respiratory: Effort normal.  Musculoskeletal: Normal range of motion.  Neurological: He is alert and oriented to person, place, and time.  Psychiatric: His speech is normal. Judgment and thought content normal. His mood appears anxious. He is actively hallucinating. Cognition and memory are normal. He exhibits a depressed mood.    Review of Systems  Psychiatric/Behavioral: Positive for depression and hallucinations.  All  other systems reviewed and are negative.   Blood pressure 109/76, pulse (!) 106, temperature 98.1 F (36.7 C), temperature source Oral, resp. rate 18, height 6' (1.829 m), weight 97.1 kg (214 lb), SpO2 97 %.Body mass index is 29.02 kg/m.  General Appearance: Casual  Eye Contact:  Fair  Speech:  Normal Rate  Volume:  Decreased  Mood:  Depressed  Affect:  Congruent  Thought Process:  Coherent and Descriptions of Associations: Intact  Orientation:  Full (Time, Place, and Person)  Thought Content:  Hallucinations: Auditory and Rumination  Suicidal Thoughts:  No  Homicidal Thoughts:  No  Memory:  Immediate;   Fair Recent;   Fair Remote;   Fair  Judgement:  Good  Insight:  Good  Psychomotor Activity:  Decreased  Concentration:  Concentration: Fair and Attention Span: Fair  Recall:  [place] of Knowledge:  Good  Language:  Good  Akathisia:  No  Handed:  Right  AIMS (if indicated):     Assets:  Housing Leisure Time Physical Health Resilience Social Support  ADL's:  Intact  Cognition:  WNL  Sleep:        Treatment Plan Summary: Daily contact with patient to assess and evaluate symptoms and progress in treatment, Medication management and Plan bipolar affective disorder, depressed, severe with psychosis:  -Crisis stabilization -Medication management:  Medical medications along with Elavil 75 mg at bedtime for sleep, Wellbutrin 150 mg daily at depression, Lamictal 50 mg BID for mood stabilization, and Zyprexa 15 mg at bedtime for hallucinations and mood -Individual counseling  Disposition: Recommend psychiatric Inpatient admission when medically cleared.  Waylan Boga, NP 10/04/2017 2:04 PM  Patient seen face-to-face for psychiatric evaluation, chart reviewed and case discussed with the physician extender and developed treatment plan. Reviewed the information documented and agree with the treatment plan. Corena Pilgrim, MD

## 2017-10-04 NOTE — Progress Notes (Addendum)
Pt came to nursing station stating he did not receive his Oxy right after getting it. Pt continues to me med focused. Pt was encouraged to talk with the doctor due to him having a long stretch after 10 pm of taking the oxy. Pt was encouraged to try to get it evenly throughout the day. Pt gets 3 x from 8 am to 5 pm and only 1 tame through the night until 8 am.

## 2017-10-04 NOTE — ED Notes (Signed)
Patient transported to [facility]- bed 2 via [place]. Patient sent with voluntary commitment form signed and all his belongings including clothes and cane. Patient verbalized that these were all of his belongings.

## 2017-10-04 NOTE — Plan of Care (Signed)
D: Pt denies SI, +ve AVH. Pt is pleasant and cooperative most of the time . Pt stated he was feeling slightly better. Pt visible on the unit this evening interacting in the dayroom.   A: Pt was offered support and encouragement. Pt was given scheduled medications. Pt was encourage to attend groups. Q 15 minute checks were done for safety.   R: Pt attends groups and interacts well with peers and staff. Pt is taking medication. Pt receptive to treatment and safety maintained on unit.   Problem: Safety: Goal: Periods of time without injury will increase Outcome: Progressing

## 2017-10-04 NOTE — Tx Team (Signed)
Initial Treatment Plan 10/04/2017 6:59 PM JAXAN MICHEL [id number]    PATIENT STRESSORS: Financial difficulties Health problems Medication change or noncompliance   PATIENT STRENGTHS: Ability for insight General fund of knowledge   PATIENT IDENTIFIED PROBLEMS: Depression     Physical pain                 DISCHARGE CRITERIA:  Improved stabilization in mood, thinking, and/or behavior  PRELIMINARY DISCHARGE PLAN: Return to previous living arrangement  PATIENT/FAMILY INVOLVEMENT: This treatment plan has been presented to and reviewed with the patient, JAKEEL STARLIPER, and/or family member.  The patient and family have been given the opportunity to ask questions and make suggestions.  Ventura Sellers, RN 10/04/2017, 6:59 PM

## 2017-10-05 MED ORDER — OLANZAPINE 10 MG PO TABS
20.0000 mg | ORAL_TABLET | Freq: Every day | ORAL | Status: DC
  Administered 2017-10-05 – 2017-10-07 (×3): 20 mg via ORAL
  Filled 2017-10-05 (×5): qty 2

## 2017-10-05 MED ORDER — OXYCODONE HCL 5 MG PO TABS
15.0000 mg | ORAL_TABLET | Freq: Four times a day (QID) | ORAL | Status: DC
  Administered 2017-10-05 – 2017-10-08 (×13): 15 mg via ORAL
  Filled 2017-10-05 (×13): qty 3

## 2017-10-05 MED ORDER — BENZTROPINE MESYLATE 0.5 MG PO TABS
0.5000 mg | ORAL_TABLET | Freq: Two times a day (BID) | ORAL | Status: DC | PRN
  Administered 2017-10-06 – 2017-10-07 (×2): 0.5 mg via ORAL
  Filled 2017-10-05 (×2): qty 1

## 2017-10-05 NOTE — Progress Notes (Signed)
Adult Psychoeducational Group Note  Date:  10/05/2017 Time:  9:12 PM  Group Topic/Focus:  Wrap-Up Group:   The focus of this group is to help patients review their daily goal of treatment and discuss progress on daily workbooks.  Participation Level:  Active  Participation Quality:  Appropriate  Affect:  Appropriate  Cognitive:  Appropriate  Insight: Appropriate  Engagement in Group:  Engaged  Modes of Intervention:  Discussion  Additional Comments: The patient expressed that he rates today a 7.The patient also said that he attended groups and reach his goal to get correction with medications.  Nash Shearer 10/05/2017, 9:12 PM

## 2017-10-05 NOTE — Progress Notes (Signed)
Recreation Therapy Notes  INPATIENT RECREATION THERAPY ASSESSMENT  Patient Details Name: [name] MRN: [id number] DOB: 11-28-1965 Today's Date: 10/05/2017       Information Obtained From: Patient  Able to Participate in Assessment/Interview: Yes  Patient Presentation: Alert, Oriented  Reason for Admission (Per Patient): Active Symptoms(Pt stated he was having hallucinations)  Patient Stressors: Other (Comment)(Spinal surgery; Lonely)  Coping Skills:   Isolation, Sports, TV, Music, Deep Breathing, Impulsivity, Talk, Avoidance, Hot Bath/Shower  Leisure Interests (2+):  Sports - Football, Individual - Other (Comment)(Watch classic movies)  Frequency of Recreation/Participation: Other (Comment)(Daily)  Awareness of Community Resources:  Yes  [employer]:  [place], [place]  Current Use: (Rarely)  If no, Barriers?:    Expressed Interest in [place]: Yes  [place] of Residence:  [place]  Patient Main Form of Transportation: Walk  Patient Strengths:  Brain; Look at things ahead of me  Patient Identified Areas of Improvement:  Social skills; Smoking  Patient Goal for Hospitalization:  "Get better"  Current SI (including self-harm):  No  Current HI:  No  Current AVH: Yes(Pt rated 7 out of 10)  Staff Intervention Plan: Group Attendance, Collaborate with Interdisciplinary Treatment Team  Consent to Intern Participation: N/A    Victorino Sparrow, LRT/CTRS  Ria Comment, Ikesha Siller A 10/05/2017, 1:40 PM

## 2017-10-05 NOTE — H&P (Signed)
Psychiatric Admission Assessment Adult  Patient Identification: [name] MRN:  [id number] Date of Evaluation:  10/05/2017 Chief Complaint:  Bipolar with psychotic features Principal Diagnosis: Bipolar I disorder, most recent episode depressed, severe with psychotic features [facility]) Diagnosis:   Patient Active Problem List   Diagnosis Date Noted  . Bipolar I disorder, most recent episode depressed, severe with psychotic features ([place]) [F31.5] 10/04/2017  . Spinal stenosis of cervical region [M48.02] 09/21/2017  . Chronic pain syndrome [G89.4] 10/28/2016  . Cannabis use disorder, mild, abuse [F12.10] 10/28/2016  . Rhabdomyolysis [M62.82] 08/23/2016  . AKI (acute kidney injury) ([place]) [N17.9] 08/23/2016  . Transaminitis [R74.0] 08/23/2016  . Drug overdose, multiple drugs [T50.901A] 08/23/2016  . Tachycardia [R00.0] 08/23/2016  . Accelerated hypertension [I10] 08/23/2016  . Bipolar disorder, curr episode mixed, severe, with psychotic features ([place]) [F31.64] 12/26/2015  . Cocaine use disorder, mild, abuse ([place]) [F14.10] 12/26/2015  . History of rhabdomyolysis [Z87.39] 12/26/2015  . Liver injury [S36.119A] 12/26/2015  . Hx of renal failure [Z87.448] 12/26/2015  . Angina, class III ([place]) [I20.9] 08/18/2015  . Dyslipidemia, goal LDL below 70 [E78.5] 08/18/2015  . Family history of premature coronary artery disease [Z82.49] 08/18/2015  . Tobacco use disorder, continuous [F17.209] 08/18/2015  . Tibial plateau fracture [S82.143A] 10/02/2013  . Multiple falls [R29.6] 10/02/2013  . Alteration in self-care ability [R53.81] 10/02/2013  . 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 Fracture of tibia with fibula, left, closed [S82.202A, S82.402A] 10/02/2013  . Chronic back pain [M54.9, G89.29] 10/02/2013  . 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 History of suicidal ideation [Z86.59] 10/02/2013  . Postural imbalance with levoscoliosis, h/o [R29.3]  10/02/2013  . Degenerative disc disease, lumbar [M51.36] 09/07/2013  . Chest pain [R07.9] 12/25/2012  . Leg pain [M79.606] 02/24/2011  . Neck pain [M54.2] 02/24/2011  . Right hip pain [M25.551] 02/24/2011  . IRRITABLE BOWEL SYNDROME [K58.9] 08/09/2008  . HYPERLIPIDEMIA [E78.5] 10/28/2006  . DISORDER, BIPOLAR NOS [F31.9] 10/28/2006  . ANXIETY [F41.1] 10/28/2006  . DEPRESSION [F32.9] 10/28/2006  . PAIN, CHRONIC NEC [G89.29] 10/28/2006  . GERD [K21.9] 10/28/2006   History of Present Illness:   [name] is a 52 y/o M with history of Bipolar I who was admitted voluntarily from [place] where he presented with worsening anxiety, VH, and CAH of multiple people telling him to kill himself. Pt had recent cervical spine surgery on 09/21/17 and reported gradual onset of symptoms after that incident. He was medically cleared and transferred to [facility] for additional treatment and stabilization.  Upon initial interview, pt shares, "I just started hallucinating. It started out as little things - like shadows in the corners of my vision, and then it just escalated." Pt describes AH/VH of seeing and hearing "About 7 people in the room all screaming. Some of them are screaming at me to kill myself. Some of them are screaming at me not to kill myself. Some of them are screaming at each other." Pt endorses experiencing AH/VH present in the room at time of interview, but he does not appear anxious or internally preoccupied. He denies SI/HI. He reports that his sleep has been poor, averaging 4-5 hours per night. He endorses some mild anhedonia, guilty feelings, low energy, and poor concentration, but he denies other symptoms of depression including depressed mood. He denies symptoms of bipolar mania but he indicates he has previously had episodes resembling manic episodes as recently as 6 months ago. He reports previous episodes of AH/VH which have been similar in  the past and resolved after medication changes. He denies  symptoms of PTSD and OCD. He denies illicit substance use.  Discussed with patient about treatment options. He reports that he follows up with Dr. Darleene Cleaver as an outpatient, and he has good adherence to his outpatient regimen. He shares that he has been on wellbutrin for 3-4 months and it has been helpful. He has been on zyprexa for about 3 years and it has been helpful. He also takes amitriptyline, belsomra, buspar, lamictal, and vistaril which he finds to all be helpful. Discussed with patient about increasing dose of zyprexa to help with sleep and hallucinations, and he is in agreement. We will plan to continue his other home medications without changes. Pt was in agreement with the above plan and he had no further questions, comments, or concerns.   Associated Signs/Symptoms: Depression Symptoms:  anhedonia, insomnia, fatigue, feelings of worthlessness/guilt, difficulty concentrating, (Hypo) Manic Symptoms:  Hallucinations, Anxiety Symptoms:  Excessive Worry, Psychotic Symptoms:  Hallucinations: Auditory Visual PTSD Symptoms: NA Total Time spent with patient: 1 hour  Past Psychiatric History:  - Previous diagnoses of bipolar I, and anxiety - 4 previous inpatient stays with last about 1 year ago - Sees Dr. Darleene Cleaver as an outpatient - no previous suicide attempts, but nearly attempted about 7 years ago as had made preparations with a gun   Is the patient at risk to self? Yes.    Has the patient been a risk to self in the past 6 months? Yes.    Has the patient been a risk to self within the distant past? Yes.    Is the patient a risk to others? Yes.    Has the patient been a risk to others in the past 6 months? Yes.    Has the patient been a risk to others within the distant past? Yes.     Prior Inpatient Therapy:   Prior Outpatient Therapy:    Alcohol Screening: 1. How often do you have a drink containing alcohol?: Never 2. How many drinks containing alcohol do you have on a  typical day when you are drinking?: 1 or 2 3. How often do you have six or more drinks on one occasion?: Never AUDIT-C Score: 0 4. How often during the last year have you found that you were not able to stop drinking once you had started?: Never 5. How often during the last year have you failed to do what was normally expected from you becasue of drinking?: Never 6. How often during the last year have you needed a first drink in the morning to get yourself going after a heavy drinking session?: Never 7. How often during the last year have you had a feeling of guilt of remorse after drinking?: Never 8. How often during the last year have you been unable to remember what happened the night before because you had been drinking?: Never 9. Have you or someone else been injured as a result of your drinking?: No 10. Has a relative or friend or a doctor or another health worker been concerned about your drinking or suggested you cut down?: No Alcohol Use Disorder Identification Test Final Score (AUDIT): 0 Intervention/Follow-up: AUDIT Score <7 follow-up not indicated Substance Abuse History in the last 12 months:  No. Consequences of Substance Abuse: NA Previous Psychotropic Medications: Yes  Psychological Evaluations: Yes  Past Medical History:  Past Medical History:  Diagnosis Date  . Accelerated hypertension 08/23/2016   pt denies this  .  Anxiety   . Arthritis   . Bipolar affective disorder ([place])   . Chronic back pain   . Chronic kidney disease 2017   AKF - due to Rhabdomyolysis  . DDD (degenerative disc disease)   . Depression   . DJD (degenerative joint disease)   . Elevated liver function tests   . Fibromyalgia   . GERD (gastroesophageal reflux disease)   . Head injury   . Head injury, closed, with concussion    x 3  from falls  . Hyperlipidemia   . Hypothyroidism   . IBS (irritable bowel syndrome)    with diarrhea  . Inguinal hernia    left  . Insomnia   . Myofascial pain  syndrome   . Osteopenia   . Pneumonia   . Shortness of breath dyspnea    with exertion  . Tibial plateau fracture, left   . Tremors of nervous system     Past Surgical History:  Procedure Laterality Date  . ANTERIOR CERVICAL DECOMP/DISCECTOMY FUSION N/A 09/21/2017   Procedure: ANTERIOR CERVICAL DECOMPRESSION/DISCECTOMY FUSION - CERVICAL THREE-CERVICAL FOUR;  Surgeon: Kary Kos, MD;  Location: [facility];  Service: Neurosurgery;  Laterality: N/A;  . CARDIAC CATHETERIZATION N/A 08/31/2015   Procedure: Left Heart Cath and Coronary Angiography;  Surgeon: Leonie Man, MD;  Location: [place] [facility];  Service: Cardiovascular;  Laterality: N/A;  . Carotid Dopplers Bilateral 02/20/2015   [facility]: Mild, less than 39% left and right internal carotid artery stenosis. No significant plaque burden  . CERVICAL DISC SURGERY     C5-7  . COLONOSCOPY    . COLONOSCOPY    . ESOPHAGOGASTRODUODENOSCOPY    . FASCIOTOMY Left 10/20/2013   Procedure: LEFT leg ANTERIOR COMPARTMENT FACSCIOTOMY;  Surgeon: Rozanna Box, MD;  Location: [place];  Service: Orthopedics;  Laterality: Left;  . INGUINAL HERNIA REPAIR Left   . Nuclear Stress Test  03/06/2015   [facility]: Normal EKG. Low normal EF (49%) normal regional wall motion. No evidence of ischemia or infarction.  . ORIF TIBIA PLATEAU Left 10/20/2013   Procedure: OPEN REDUCTION INTERNAL FIXATION (ORIF) LEFT TIBIAL PLATEAU;  Surgeon: Rozanna Box, MD;  Location: [place];  Service: Orthopedics;  Laterality: Left;  . SEPTOPLASTY    . SPINAL CORD STIMULATOR BATTERY EXCHANGE N/A 02/08/2016   Procedure: Lumbar spinal cord stimulator implantable pulse generator replacement;  Surgeon: Clydell Hakim, MD;  Location: [place] [facility];  Service: Neurosurgery;  Laterality: N/A;  . SPINAL CORD STIMULATOR IMPLANT    . TRANSTHORACIC ECHOCARDIOGRAM  02/20/2015   [facility]: Mild concentric LVH. EF 55-60%. Normal regional wall motion. Mild to  moderate TR with no significant pulmonary hypertension.   Family History:  Family History  Problem Relation Age of Onset  . Heart disease Mother   . Hypertension Mother   . Sudden death Mother        Presumably cardiac  . Hyperlipidemia Father   . Heart attack Father 93       At least 5 MIs. Had CABG.  . Heart failure Father   . Diabetes Father   . Hypertension Father   . Kidney disease Father   . Diabetes Brother   . Lung cancer Maternal Grandmother   . Diabetes Paternal Grandmother   . Sudden death Paternal Grandmother        Unclear etiology  . Diabetes Unknown   . Kidney disease Unknown   . Kidney disease Brother   . Hypertension  Brother   . Sudden death Brother        Thought to be related to heart disease  . Throat cancer Brother   . Colon cancer Neg Hx   . Mental illness Neg Hx    Family Psychiatric  History: mother has history of unknown mental illness. No fam hx of suicide attempt/completion. Tobacco Screening: Have you used any form of tobacco in the last 30 days? (Cigarettes, Smokeless Tobacco, Cigars, and/or Pipes): No Social History: Pt was born and raised in [place]. He lives on his own in [place]. He is not working and gets SSI. He is separated from his wife. He has one daughter age 33. He denies legal history.  Social History   Substance and Sexual Activity  Alcohol Use No  . Alcohol/week: 0.0 oz   Comment: none since 1999- heavy drinker in past     Social History   Substance and Sexual Activity  Drug Use Yes  . Types: Oxycodone, Cocaine   Comment: rarely- last 2015-    Additional Social History:      Pain Medications: none Prescriptions: none Over the Counter: none History of alcohol / drug use?: No history of alcohol / drug abuse                    Allergies:   Allergies  Allergen Reactions  . Zolpidem Tartrate Other (See Comments)    Hallucinations and sleep walks  . Lyrica [Pregabalin] Other (See Comments)    Causes extreme  depression  . Demerol [Meperidine] Itching, Rash and Hives   Lab Results:  Results for orders placed or performed during the hospital encounter of 10/03/17 (from the past 48 hour(s))  Comprehensive metabolic panel     Status: Abnormal   Collection Time: 10/03/17  9:08 PM  Result Value Ref Range   Sodium 137 135 - 145 mmol/L   Potassium 3.8 3.5 - 5.1 mmol/L   Chloride 105 101 - 111 mmol/L   CO2 20 (L) 22 - 32 mmol/L   Glucose, Bld 102 (H) 65 - 99 mg/dL   BUN 23 (H) 6 - 20 mg/dL   Creatinine, Ser 1.19 0.61 - 1.24 mg/dL   Calcium 10.1 8.9 - 10.3 mg/dL   Total Protein 8.3 (H) 6.5 - 8.1 g/dL   Albumin 4.3 3.5 - 5.0 g/dL   AST 20 15 - 41 U/L   ALT 19 17 - 63 U/L   Alkaline Phosphatase 92 38 - 126 U/L   Total Bilirubin 0.9 0.3 - 1.2 mg/dL   GFR calc non Af Amer >60 >60 mL/min   GFR calc Af Amer >60 >60 mL/min    Comment: (NOTE) The eGFR has been calculated using the CKD EPI equation. This calculation has not been validated in all clinical situations. eGFR's persistently <60 mL/min signify possible Chronic Kidney Disease.    Anion gap 12 5 - 15    Comment: Performed at [facility], [place] [address] [place], [place] [address]  Ethanol     Status: None   Collection Time: 10/03/17  9:08 PM  Result Value Ref Range   Alcohol, Ethyl (B) <10 <10 mg/dL    Comment:        LOWEST DETECTABLE LIMIT FOR SERUM ALCOHOL IS 10 mg/dL FOR MEDICAL PURPOSES ONLY Performed at [facility] [address] [place], [place] [address]   Salicylate level     Status: None   Collection Time: 10/03/17  9:08 PM  Result Value Ref Range   Salicylate Lvl <3.7 2.8 - 30.0 mg/dL    Comment: Performed at [facility], [place] [address] [place], [place] [address]  Acetaminophen level     Status: Abnormal   Collection Time: 10/03/17  9:08 PM  Result Value Ref Range   Acetaminophen (Tylenol), Serum <10 (L) 10 - 30 ug/mL    Comment:        THERAPEUTIC  CONCENTRATIONS VARY SIGNIFICANTLY. A RANGE OF 10-30 ug/mL MAY BE AN EFFECTIVE CONCENTRATION FOR MANY PATIENTS. HOWEVER, SOME ARE BEST TREATED AT CONCENTRATIONS OUTSIDE THIS RANGE. ACETAMINOPHEN CONCENTRATIONS >150 ug/mL AT 4 HOURS AFTER INGESTION AND >50 ug/mL AT 12 HOURS AFTER INGESTION ARE OFTEN ASSOCIATED WITH TOXIC REACTIONS. Performed at [facility], [place] [address] [place], [place] [address]   cbc     Status: Abnormal   Collection Time: 10/03/17  9:08 PM  Result Value Ref Range   WBC 12.9 (H) 4.0 - 10.5 K/uL   RBC 4.98 4.22 - 5.81 MIL/uL   Hemoglobin 14.7 13.0 - 17.0 g/dL   HCT 42.3 39.0 - 52.0 %   MCV 84.9 78.0 - 100.0 fL   MCH 29.5 26.0 - 34.0 pg   MCHC 34.8 30.0 - 36.0 g/dL   RDW 12.5 11.5 - 15.5 %   Platelets 457 (H) 150 - 400 K/uL    Comment: Performed at [facility], [place] [address], [place], [place] [address]  Rapid urine drug screen (hospital performed)     Status: Abnormal   Collection Time: 10/03/17  9:08 PM  Result Value Ref Range   Opiates POSITIVE (A) NONE DETECTED   Cocaine NONE DETECTED NONE DETECTED   Benzodiazepines POSITIVE (A) NONE DETECTED   Amphetamines NONE DETECTED NONE DETECTED   Tetrahydrocannabinol NONE DETECTED NONE DETECTED   Barbiturates NONE DETECTED NONE DETECTED    Comment: (NOTE) DRUG SCREEN FOR MEDICAL PURPOSES ONLY.  IF CONFIRMATION IS NEEDED FOR ANY PURPOSE, NOTIFY LAB WITHIN 5 DAYS. LOWEST DETECTABLE LIMITS FOR URINE DRUG SCREEN Drug Class                     Cutoff (ng/mL) Amphetamine and metabolites    1000 Barbiturate and metabolites    200 Benzodiazepine                 355 Tricyclics and metabolites     300 Opiates and metabolites        300 Cocaine and metabolites        300 THC                            50 Performed at [facility], [place] [address], [place],  [address]     Blood Alcohol level:  Lab Results  Component Value Date   [facility] <10  10/03/2017   ETH <5 38/45/3646    Metabolic Disorder Labs:  Lab Results  Component Value Date   HGBA1C 4.6 (L) 10/29/2016   MPG 85 10/29/2016   MPG 103 12/26/2015   Lab Results  Component Value Date   PROLACTIN 27.7 (H) 10/29/2016   Lab Results  Component Value Date   CHOL 245 (H) 10/29/2016   TRIG 195 (H) 10/29/2016   HDL 47 10/29/2016   CHOLHDL 5.2 10/29/2016   VLDL 39 10/29/2016   LDLCALC 159 (H) 10/29/2016   LDLCALC 176 (H) 12/27/2015    Current Medications: Current Facility-Administered  Medications  Medication Dose Route Frequency Provider Last Rate Last Dose  . acetaminophen (TYLENOL) tablet 650 mg  650 mg Oral Q6H PRN Patrecia Pour, NP      . alum & mag hydroxide-simeth (MAALOX/MYLANTA) 200-200-20 MG/5ML suspension 30 mL  30 mL Oral Q6H PRN Patrecia Pour, NP      . amitriptyline (ELAVIL) tablet 75 mg  75 mg Oral QHS Patrecia Pour, NP   75 mg at 10/04/17 2127  . aspirin EC tablet 81 mg  81 mg Oral Daily Patrecia Pour, NP   81 mg at 10/05/17 4782  . benztropine (COGENTIN) tablet 0.5 mg  0.5 mg Oral BID PRN Maris Berger T, MD      . buPROPion (WELLBUTRIN XL) 24 hr tablet 150 mg  150 mg Oral Daily Patrecia Pour, NP   150 mg at 10/05/17 0807  . busPIRone (BUSPAR) tablet 15 mg  15 mg Oral BID Patrecia Pour, NP   15 mg at 10/05/17 9562  . cyclobenzaprine (FLEXERIL) tablet 10 mg  10 mg Oral TID PRN Patrecia Pour, NP   10 mg at 10/04/17 2141  . hydrOXYzine (ATARAX/VISTARIL) tablet 50 mg  50 mg Oral TID Patrecia Pour, NP   50 mg at 10/05/17 1145  . lamoTRIgine (LAMICTAL) tablet 50 mg  50 mg Oral BID Patrecia Pour, NP   50 mg at 10/05/17 1308  . levothyroxine (SYNTHROID, LEVOTHROID) tablet 50 mcg  50 mcg Oral QAC breakfast Patrecia Pour, NP   50 mcg at 10/05/17 0806  . magnesium hydroxide (MILK OF MAGNESIA) suspension 30 mL  30 mL Oral Daily PRN Patrecia Pour, NP      . nicotine (NICODERM CQ - dosed in mg/24 hours) patch 14 mg  14 mg Transdermal  Daily Cobos, Myer Peer, MD   14 mg at 10/05/17 0815  . nitroGLYCERIN (NITROSTAT) SL tablet 0.4 mg  0.4 mg Sublingual Q5 min PRN Patrecia Pour, NP      . OLANZapine (ZYPREXA) tablet 20 mg  20 mg Oral QHS Pennelope Bracken, MD      . ondansetron [facility]) tablet 4 mg  4 mg Oral Q8H PRN Patrecia Pour, NP      . oxyCODONE (Oxy IR/ROXICODONE) immediate release tablet 15 mg  15 mg Oral QID Pennelope Bracken, MD   15 mg at 10/05/17 1146  . pantoprazole (PROTONIX) EC tablet 40 mg  40 mg Oral Q1200 Patrecia Pour, NP   40 mg at 10/05/17 1149  . promethazine (PHENERGAN) tablet 25 mg  25 mg Oral Q6H PRN Patrecia Pour, NP      . Suvorexant TABS 10 mg  10 mg Oral QHS Patrecia Pour, NP      . Derrill Memo ON 10/08/2017] Vitamin D (Ergocalciferol) (DRISDOL) capsule 50,000 Units  50,000 Units Oral Q Ladora Daniel, NP       PTA Medications: Medications Prior to Admission  Medication Sig Dispense Refill Last Dose  . amitriptyline (ELAVIL) 10 MG tablet Take 2 tablets (20 mg total) by mouth at bedtime. For depression (Patient not taking: Reported on 09/09/2017) 30 tablet 0 Not Taking at Unknown time  . amitriptyline (ELAVIL) 25 MG tablet Take 75 mg by mouth at bedtime.   10/02/2017  . aspirin EC 81 MG tablet Take 1 tablet (81 mg total) by mouth daily. For hearth health   10/03/2017 at Unknown time  . buPROPion (WELLBUTRIN XL) 150 MG  24 hr tablet Take 150 mg by mouth daily.   10/03/2017 at Unknown time  . busPIRone (BUSPAR) 15 MG tablet Take 1 tablet (15 mg total) by mouth 2 (two) times daily. For anxiety 60 tablet 0 10/03/2017 at Unknown time  . cyclobenzaprine (FLEXERIL) 10 MG tablet Take 1 tablet (10 mg total) by mouth 3 (three) times daily as needed for muscle spasms. 30 tablet 0 Past Month at Unknown time  . esomeprazole (NEXIUM) 40 MG capsule Take 1 capsule (40 mg total) by mouth daily at 12 noon. For indigestion (Patient taking differently: Take 40 mg by mouth daily. For indigestion) 30 capsule 0  10/03/2017 at Unknown time  . hydrOXYzine (ATARAX/VISTARIL) 25 MG tablet Take 1 tablet (25 mg total) by mouth every 8 (eight) hours as needed for anxiety. For anxiety (Patient not taking: Reported on 09/09/2017) 75 tablet 0 Not Taking at Unknown time  . hydrOXYzine (ATARAX/VISTARIL) 50 MG tablet Take 50 mg by mouth 3 (three) times daily.   10/03/2017 at 1800  . lamoTRIgine (LAMICTAL) 100 MG tablet Take 50 mg by mouth 2 (two) times daily.   10/03/2017 at 1800  . lamoTRIgine (LAMICTAL) 25 MG tablet Take 1 tablet (25 mgs) in the morning & 2 tablets (50 mgs) at bedtime: For mood stabilization (Patient not taking: Reported on 09/09/2017) 90 tablet 0 Not Taking at Unknown time  . levothyroxine (SYNTHROID, LEVOTHROID) 50 MCG tablet Take 50 mcg by mouth daily before breakfast.   10/03/2017 at Unknown time  . nicotine (NICODERM CQ - DOSED IN MG/24 HOURS) 14 mg/24hr patch Place 1 patch (14 mg total) onto the skin daily. For smoking ceasation 28 patch 0 Past Month at Unknown time  . nicotine polacrilex (COMMIT) 4 MG lozenge Take 4 mg by mouth as needed for smoking cessation.   Past Month at Unknown time  . nitroGLYCERIN (NITROSTAT) 0.4 MG SL tablet Place 0.4 mg under the tongue every 5 (five) minutes as needed for chest pain.   UNK  . OLANZapine (ZYPREXA) 15 MG tablet Take 1 tablet (15 mg total) by mouth at bedtime. For bipolar 30 tablet 0 10/02/2017  . Omega-3 Fatty Acids (FISH OIL PO) Take 2,000 mg by mouth 2 (two) times daily.   Past Week at Unknown time  . oxyCODONE (ROXICODONE) 15 MG immediate release tablet Take 15 mg by mouth 3 (three) times daily.   10/03/2017 at 1800  . promethazine (PHENERGAN) 25 MG tablet Take 25 mg by mouth every 6 (six) hours as needed for nausea or vomiting.   UNK  . Suvorexant (BELSOMRA) 10 MG TABS Take 10 mg by mouth at bedtime.    10/02/2017  . Vitamin D, Ergocalciferol, (DRISDOL) 50000 units CAPS capsule Take 50,000 Units by mouth every Thursday.   10/01/2017    Musculoskeletal: Strength &  Muscle Tone: within normal limits Gait & Station: normal Patient leans: N/A  Psychiatric Specialty Exam: Physical Exam  Nursing note and vitals reviewed.   Review of Systems  Constitutional: Negative for chills and fever.  Respiratory: Negative for cough and shortness of breath.   Cardiovascular: Negative for chest pain.  Gastrointestinal: Negative for abdominal pain, heartburn, nausea and vomiting.  Psychiatric/Behavioral: Negative for depression, hallucinations and suicidal ideas. The patient is not nervous/anxious and does not have insomnia.     Blood pressure 108/84, pulse (!) 118, temperature 98.9 F (37.2 C), temperature source Oral, resp. rate 18, height 6' (1.829 m), weight 97.1 kg (214 lb).Body mass index is 29.02 kg/m.  General  Appearance: Casual and Fairly Groomed  Eye Contact:  Good  Speech:  Clear and Coherent and Normal Rate  Volume:  Normal  Mood:  Anxious  Affect:  Congruent and Constricted  Thought Process:  Coherent and Goal Directed  Orientation:  Full (Time, Place, and Person)  Thought Content:  Hallucinations: Auditory Visual  Suicidal Thoughts:  No  Homicidal Thoughts:  No  Memory:  Immediate;   Fair Recent;   Fair Remote;   Fair  Judgement:  Fair  Insight:  Lacking  Psychomotor Activity:  Normal  Concentration:  Concentration: Fair  Recall:  [employer] of Knowledge:  Fair  Language:  Fair  Akathisia:  No  Handed:    AIMS (if indicated):     Assets:  Desire for Improvement Housing Resilience Social Support  ADL's:  Intact  Cognition:  WNL  Sleep:  Number of Hours: 6    Treatment Plan Summary: Daily contact with patient to assess and evaluate symptoms and progress in treatment and Medication management  Observation Level/Precautions:  15 minute checks  Laboratory:  CBC Chemistry Profile HbAIC UDS UA  Psychotherapy:  Encourage participation in groups and therapeutic milieu   Medications:  Change zyprexa 44m po qhs to zyprexa 210mpo  qhs. Continue amitriptyline 7535mhs. Continue asiprin 62m36may. Continue cogentin 0.5mg 34mBID prn EPS. Continue wellbutrin XL 150mg 53mDay. Continue buspar 15mg p15mD. Continue flexeril 10mg po75m. Continue atarax 50mg po 42m Continue lamictal 50mg po B47mContinue synthroid 50mcg qDay34mntinue oxycodone 15mg po QID63mntinue protonix 40mg po qday25mntinue suvorexant 10mg po qDay.22m Discharge Concerns:    Estimated LOS: 5-7 days  Other:     Physician Treatment Plan for Primary Diagnosis: Bipolar I disorder, most recent episode depressed, severe with psychotic features (HCC) Long TermAlburnettal(s): Improvement in symptoms so as ready for discharge  Short Term Goals: Ability to identify and develop effective coping behaviors will improve  Physician Treatment Plan for Secondary Diagnosis: Principal Problem:   Bipolar I disorder, most recent episode depressed, severe with psychotic features (HCC)  Long TerNashotahoal(s): Improvement in symptoms so as ready for discharge  Short Term Goals: Ability to maintain clinical measurements within normal limits will improve  I certify that inpatient services furnished can reasonably be expected to improve the patient's condition.    Jazmen Lindenbaum T Pennelope Bracken:18 PM

## 2017-10-05 NOTE — Tx Team (Signed)
Interdisciplinary Treatment and Diagnostic Plan Update  10/05/2017 Time of Session: 3:33 PM  [name] MRN: [id number]  Principal Diagnosis: Bipolar I disorder, most recent episode depressed, severe with psychotic features ([name])  Secondary Diagnoses: Principal Problem:   Bipolar I disorder, most recent episode depressed, severe with psychotic features ([name])   Current Medications:  Current Facility-Administered Medications  Medication Dose Route Frequency Provider Last Rate Last Dose  . acetaminophen (TYLENOL) tablet 650 mg  650 mg Oral Q6H PRN Patrecia Pour, NP      . alum & mag hydroxide-simeth (MAALOX/MYLANTA) 200-200-20 MG/5ML suspension 30 mL  30 mL Oral Q6H PRN Patrecia Pour, NP      . amitriptyline (ELAVIL) tablet 75 mg  75 mg Oral QHS Patrecia Pour, NP   75 mg at 10/04/17 2127  . aspirin EC tablet 81 mg  81 mg Oral Daily Patrecia Pour, NP   81 mg at 10/05/17 7867  . benztropine (COGENTIN) tablet 0.5 mg  0.5 mg Oral BID PRN Maris Berger T, MD      . buPROPion (WELLBUTRIN XL) 24 hr tablet 150 mg  150 mg Oral Daily Patrecia Pour, NP   150 mg at 10/05/17 0807  . busPIRone (BUSPAR) tablet 15 mg  15 mg Oral BID Patrecia Pour, NP   15 mg at 10/05/17 6720  . cyclobenzaprine (FLEXERIL) tablet 10 mg  10 mg Oral TID PRN Patrecia Pour, NP   10 mg at 10/04/17 2141  . hydrOXYzine (ATARAX/VISTARIL) tablet 50 mg  50 mg Oral TID Patrecia Pour, NP   50 mg at 10/05/17 1145  . lamoTRIgine (LAMICTAL) tablet 50 mg  50 mg Oral BID Patrecia Pour, NP   50 mg at 10/05/17 9470  . levothyroxine (SYNTHROID, LEVOTHROID) tablet 50 mcg  50 mcg Oral QAC breakfast Patrecia Pour, NP   50 mcg at 10/05/17 0806  . magnesium hydroxide (MILK OF MAGNESIA) suspension 30 mL  30 mL Oral Daily PRN Patrecia Pour, NP      . nicotine (NICODERM CQ - dosed in mg/24 hours) patch 14 mg  14 mg Transdermal Daily Cobos, Myer Peer, MD   14 mg at 10/05/17 0815  . nitroGLYCERIN (NITROSTAT) SL tablet 0.4  mg  0.4 mg Sublingual Q5 min PRN Patrecia Pour, NP      . OLANZapine (ZYPREXA) tablet 20 mg  20 mg Oral QHS Pennelope Bracken, MD      . ondansetron [facility]) tablet 4 mg  4 mg Oral Q8H PRN Patrecia Pour, NP      . oxyCODONE (Oxy IR/ROXICODONE) immediate release tablet 15 mg  15 mg Oral QID Pennelope Bracken, MD   15 mg at 10/05/17 1146  . pantoprazole (PROTONIX) EC tablet 40 mg  40 mg Oral Q1200 Patrecia Pour, NP   40 mg at 10/05/17 1149  . promethazine (PHENERGAN) tablet 25 mg  25 mg Oral Q6H PRN Patrecia Pour, NP      . Suvorexant TABS 10 mg  10 mg Oral QHS Patrecia Pour, NP      . Derrill Memo ON 10/08/2017] Vitamin D (Ergocalciferol) (DRISDOL) capsule 50,000 Units  50,000 Units Oral Q Ladora Daniel, NP        PTA Medications: Medications Prior to Admission  Medication Sig Dispense Refill Last Dose  . amitriptyline (ELAVIL) 10 MG tablet Take 2 tablets (20 mg total) by mouth at bedtime. For depression (Patient not  taking: Reported on 09/09/2017) 30 tablet 0 Not Taking at Unknown time  . amitriptyline (ELAVIL) 25 MG tablet Take 75 mg by mouth at bedtime.   10/02/2017  . aspirin EC 81 MG tablet Take 1 tablet (81 mg total) by mouth daily. For hearth health   10/03/2017 at Unknown time  . buPROPion (WELLBUTRIN XL) 150 MG 24 hr tablet Take 150 mg by mouth daily.   10/03/2017 at Unknown time  . busPIRone (BUSPAR) 15 MG tablet Take 1 tablet (15 mg total) by mouth 2 (two) times daily. For anxiety 60 tablet 0 10/03/2017 at Unknown time  . cyclobenzaprine (FLEXERIL) 10 MG tablet Take 1 tablet (10 mg total) by mouth 3 (three) times daily as needed for muscle spasms. 30 tablet 0 Past Month at Unknown time  . esomeprazole (NEXIUM) 40 MG capsule Take 1 capsule (40 mg total) by mouth daily at 12 noon. For indigestion (Patient taking differently: Take 40 mg by mouth daily. For indigestion) 30 capsule 0 10/03/2017 at Unknown time  . hydrOXYzine (ATARAX/VISTARIL) 25 MG tablet Take 1 tablet (25 mg  total) by mouth every 8 (eight) hours as needed for anxiety. For anxiety (Patient not taking: Reported on 09/09/2017) 75 tablet 0 Not Taking at Unknown time  . hydrOXYzine (ATARAX/VISTARIL) 50 MG tablet Take 50 mg by mouth 3 (three) times daily.   10/03/2017 at 1800  . lamoTRIgine (LAMICTAL) 100 MG tablet Take 50 mg by mouth 2 (two) times daily.   10/03/2017 at 1800  . lamoTRIgine (LAMICTAL) 25 MG tablet Take 1 tablet (25 mgs) in the morning & 2 tablets (50 mgs) at bedtime: For mood stabilization (Patient not taking: Reported on 09/09/2017) 90 tablet 0 Not Taking at Unknown time  . levothyroxine (SYNTHROID, LEVOTHROID) 50 MCG tablet Take 50 mcg by mouth daily before breakfast.   10/03/2017 at Unknown time  . nicotine (NICODERM CQ - DOSED IN MG/24 HOURS) 14 mg/24hr patch Place 1 patch (14 mg total) onto the skin daily. For smoking ceasation 28 patch 0 Past Month at Unknown time  . nicotine polacrilex (COMMIT) 4 MG lozenge Take 4 mg by mouth as needed for smoking cessation.   Past Month at Unknown time  . nitroGLYCERIN (NITROSTAT) 0.4 MG SL tablet Place 0.4 mg under the tongue every 5 (five) minutes as needed for chest pain.   UNK  . OLANZapine (ZYPREXA) 15 MG tablet Take 1 tablet (15 mg total) by mouth at bedtime. For bipolar 30 tablet 0 10/02/2017  . Omega-3 Fatty Acids (FISH OIL PO) Take 2,000 mg by mouth 2 (two) times daily.   Past Week at Unknown time  . oxyCODONE (ROXICODONE) 15 MG immediate release tablet Take 15 mg by mouth 3 (three) times daily.   10/03/2017 at 1800  . promethazine (PHENERGAN) 25 MG tablet Take 25 mg by mouth every 6 (six) hours as needed for nausea or vomiting.   UNK  . Suvorexant (BELSOMRA) 10 MG TABS Take 10 mg by mouth at bedtime.    10/02/2017  . Vitamin D, Ergocalciferol, (DRISDOL) 50000 units CAPS capsule Take 50,000 Units by mouth every Thursday.   10/01/2017    Patient Stressors: Financial difficulties Health problems Medication change or noncompliance  Patient Strengths: Ability  for insight General fund of knowledge  Treatment Modalities: Medication Management, Group therapy, Case management,  1 to 1 session with clinician, Psychoeducation, Recreational therapy.   Physician Treatment Plan for Primary Diagnosis: Bipolar I disorder, most recent episode depressed, severe with psychotic features ([place]) Long  Term Goal(s): Improvement in symptoms so as ready for discharge  Short Term Goals:    Medication Management: Evaluate patient's response, side effects, and tolerance of medication regimen.  Therapeutic Interventions: 1 to 1 sessions, Unit Group sessions and Medication administration.  Evaluation of Outcomes: Progressing  Physician Treatment Plan for Secondary Diagnosis: Principal Problem:   Bipolar I disorder, most recent episode depressed, severe with psychotic features ([place])   Long Term Goal(s): Improvement in symptoms so as ready for discharge  Short Term Goals:    Medication Management: Evaluate patient's response, side effects, and tolerance of medication regimen.  Therapeutic Interventions: 1 to 1 sessions, Unit Group sessions and Medication administration.  Evaluation of Outcomes: Progressing   RN Treatment Plan for Primary Diagnosis: Bipolar I disorder, most recent episode depressed, severe with psychotic features ([place]) Long Term Goal(s): Knowledge of disease and therapeutic regimen to maintain health will improve  Short Term Goals: Ability to identify and develop effective coping behaviors will improve and Compliance with prescribed medications will improve  Medication Management: RN will administer medications as ordered by provider, will assess and evaluate patient's response and provide education to patient for prescribed medication. RN will report any adverse and/or side effects to prescribing provider.  Therapeutic Interventions: 1 on 1 counseling sessions, Psychoeducation, Medication administration, Evaluate responses to treatment,  Monitor vital signs and CBGs as ordered, Perform/monitor CIWA, COWS, AIMS and Fall Risk screenings as ordered, Perform wound care treatments as ordered.  Evaluation of Outcomes: Progressing   LCSW Treatment Plan for Primary Diagnosis: Bipolar I disorder, most recent episode depressed, severe with psychotic features ([place]) Long Term Goal(s): Safe transition to appropriate next level of care at discharge, Engage patient in therapeutic group addressing interpersonal concerns.  Short Term Goals: Engage patient in aftercare planning with referrals and resources  Therapeutic Interventions: Assess for all discharge needs, 1 to 1 time with Social worker, Explore available resources and support systems, Assess for adequacy in community support network, Educate family and significant other(s) on suicide prevention, Complete Psychosocial Assessment, Interpersonal group therapy.  Evaluation of Outcomes: Met  Return home, follow up with current providers   Progress in Treatment: Attending groups: Yes Participating in groups: Yes Taking medication as prescribed: Yes Toleration medication: Yes, no side effects reported at this time Family/Significant other contact made: No Patient understands diagnosis: Yes AEB asking for help with psychosis Discussing patient identified problems/goals with staff: Yes Medical problems stabilized or resolved: Yes Denies suicidal/homicidal ideation: Yes Issues/concerns per patient self-inventory: None Other: N/A  New problem(s) identified: None identified at this time.   New Short Term/Long Term Goal(s): "I need help with figuring out my medication schedule. I just had neck surgery and I am in pain all the time.  I also have hallucinations.  There are 7 or 8 people in this room, and they are all yelling at me."  Discharge Plan or Barriers:   Reason for Continuation of Hospitalization: Hallucinations  Medical Issues Medication stabilization   Estimated Length  of Stay: 4/10  Attendees: Patient: [name] 10/05/2017  3:33 PM  Physician: Maris Berger, MD 10/05/2017  3:33 PM  Nursing: Sena Hitch, RN 10/05/2017  3:33 PM  RN Care Manager: Lars Pinks, RN 10/05/2017  3:33 PM  Social Worker: Ripley Fraise 10/05/2017  3:33 PM  Recreational Therapist: Winfield Cunas 10/05/2017  3:33 PM  Other: Norberto Sorenson 10/05/2017  3:33 PM  Other:  10/05/2017  3:33 PM    Scribe for Treatment Team:  Roque Lias LCSW 10/05/2017 3:33  PM

## 2017-10-05 NOTE — Progress Notes (Signed)
Recreation Therapy Notes  Date: 5.6.19 Time: 1000 Location: 500 Hall Dayroom  Group Topic: Anger Management  Goal Area(s) Addresses:  Patient will identify triggers for anger.  Patient will identify physical reaction to anger.   Patient will identify benefit of using coping skills when angry.  Intervention: Worksheet  Activity: Intro to Anger Management.  Patients were given a worksheet in which they were to identify what causes their anger, their reaction to anger and any problems they have encountered because of anger.  Education: Anger Management, Discharge Planning   Education Outcome: Acknowledges education/In group clarification offered/Needs additional education.   Clinical Observations/Feedback: Pt did not attend group.      Victorino Sparrow, LRT/CTRS          Victorino Sparrow A 10/05/2017 11:44 AM

## 2017-10-05 NOTE — Progress Notes (Signed)
D: Pt visible in milieu / hall on initial contact. Denies SI and HI when assessed. Endorsed +AVH "I see 7 people right now and they are all yelling at me, telling me to kill myself". Rates his pain 8/10 "my neck" Presents with blunted affect, irritable mood and was demanding on initial approach, "I want my pain pill now, I did not sleep last night, you guys did not give me my medicine like I take it at home".  Reports poor sleep last night with poor appetite, low energy and good concentration level. Rates his depression, anxiety and hopelessness all 7/10. Pt's goal for today is "to get my medicines straight". A: Introduced self to pt. Emotional support and availability provided to pt. All medications administered with verbal education and effects monitored. Safety checks maintained at Q 15 minutes intervals without outburst or self harm gestures.   R: Pt receptive to care. Compliant with medications when offered. Denies adverse drug reactions when assessed this shift. Tolerates all PO intake well. Remains safe on and off unit.

## 2017-10-05 NOTE — BHH Suicide Risk Assessment (Signed)
[facility] Admission Suicide Risk Assessment   Nursing information obtained from:  Patient Demographic factors:  Male, Caucasian Current Mental Status:  NA Loss Factors:  Financial problems / change in socioeconomic status Historical Factors:  Family history of mental illness or substance abuse, NA Risk Reduction Factors:  Sense of responsibility to family  Total Time spent with patient: 1 hour Principal Problem: Bipolar I disorder, most recent episode depressed, severe with psychotic features ([place]) Diagnosis:   Patient Active Problem List   Diagnosis Date Noted  . Bipolar I disorder, most recent episode depressed, severe with psychotic features ([place]) [F31.5] 10/04/2017  . Spinal stenosis of cervical region [M48.02] 09/21/2017  . Chronic pain syndrome [G89.4] 10/28/2016  . Cannabis use disorder, mild, abuse [F12.10] 10/28/2016  . Rhabdomyolysis [M62.82] 08/23/2016  . AKI (acute kidney injury) ([place]) [N17.9] 08/23/2016  . Transaminitis [R74.0] 08/23/2016  . Drug overdose, multiple drugs [T50.901A] 08/23/2016  . Tachycardia [R00.0] 08/23/2016  . Accelerated hypertension [I10] 08/23/2016  . Bipolar disorder, curr episode mixed, severe, with psychotic features ([place]) [F31.64] 12/26/2015  . Cocaine use disorder, mild, abuse ([place]) [F14.10] 12/26/2015  . History of rhabdomyolysis [Z87.39] 12/26/2015  . Liver injury [S36.119A] 12/26/2015  . Hx of renal failure [Z87.448] 12/26/2015  . Angina, class III ([place]) [I20.9] 08/18/2015  . Dyslipidemia, goal LDL below 70 [E78.5] 08/18/2015  . Family history of premature coronary artery disease [Z82.49] 08/18/2015  . Tobacco use disorder, continuous [F17.209] 08/18/2015  . Tibial plateau fracture [S82.143A] 10/02/2013  . Multiple falls [R29.6] 10/02/2013  . Alteration in self-care ability [R53.81] 10/02/2013  . 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 Fracture of tibia with fibula, left, closed [S82.202A, S82.402A] 10/02/2013  .  Chronic back pain [M54.9, G89.29] 10/02/2013  . 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 History of suicidal ideation [Z86.59] 10/02/2013  . Postural imbalance with levoscoliosis, h/o [R29.3] 10/02/2013  . Degenerative disc disease, lumbar [M51.36] 09/07/2013  . Chest pain [R07.9] 12/25/2012  . Leg pain [M79.606] 02/24/2011  . Neck pain [M54.2] 02/24/2011  . Right hip pain [M25.551] 02/24/2011  . IRRITABLE BOWEL SYNDROME [K58.9] 08/09/2008  . HYPERLIPIDEMIA [E78.5] 10/28/2006  . DISORDER, BIPOLAR NOS [F31.9] 10/28/2006  . ANXIETY [F41.1] 10/28/2006  . DEPRESSION [F32.9] 10/28/2006  . PAIN, CHRONIC NEC [G89.29] 10/28/2006  . GERD [K21.9] 10/28/2006   Subjective Data: See H&P for details  Continued Clinical Symptoms:  Alcohol Use Disorder Identification Test Final Score (AUDIT): 0 The "Alcohol Use Disorders Identification Test", Guidelines for Use in Primary Care, Second Edition.  World [employer]). Score between 0-7:  no or low risk or alcohol related problems. Score between 8-15:  moderate risk of alcohol related problems. Score between 16-19:  high risk of alcohol related problems. Score 20 or above:  warrants further diagnostic evaluation for alcohol dependence and treatment.   CLINICAL FACTORS:   Severe Anxiety and/or Agitation Bipolar Disorder:   Mixed State Currently Psychotic Previous Psychiatric Diagnoses and Treatments Medical Diagnoses and Treatments/Surgeries   Musculoskeletal: Strength & Muscle Tone: within normal limits Gait & Station: normal Patient leans: N/A  Psychiatric Specialty Exam: Physical Exam  Nursing note and vitals reviewed.   ROS- See H&P for details  Blood pressure 108/84, pulse (!) 118, temperature 98.9 F (37.2 C), temperature source Oral, resp. rate 18, height 6' (1.829 m), weight 97.1 kg (214 lb).Body mass index is 29.02 kg/m.   COGNITIVE FEATURES THAT CONTRIBUTE TO RISK:  None    SUICIDE RISK:    Minimal: No identifiable  suicidal ideation.  Patients presenting with no risk factors but with morbid ruminations; may be classified as minimal risk based on the severity of the depressive symptoms  PLAN OF CARE: See H&P for details  I certify that inpatient services furnished can reasonably be expected to improve the patient's condition.   Pennelope Bracken, MD 10/05/2017, 5:16 PM

## 2017-10-05 NOTE — BHH Suicide Risk Assessment (Signed)
[place] INPATIENT:  Family/Significant Other Suicide Prevention Education  Suicide Prevention Education:  Education Completed; No one has been identified by the patient as the family member/significant other with whom the patient will be residing, and identified as the person(s) who will aid the patient in the event of a mental health crisis (suicidal ideations/suicide attempt).  With written consent from the patient, the family member/significant other has been provided the following suicide prevention education, prior to the and/or following the discharge of the patient.  The suicide prevention education provided includes the following:  Suicide risk factors  Suicide prevention and interventions  National Suicide Hotline telephone number  [facility] assessment telephone number  [facility] Emergency Assistance [place] and/or Residential Mobile Crisis Unit telephone number  Request made of family/significant other to:  Remove weapons (e.g., guns, rifles, knives), all items previously/currently identified as safety concern.    Remove drugs/medications (over-the-counter, prescriptions, illicit drugs), all items previously/currently identified as a safety concern.  The family member/significant other verbalizes understanding of the suicide prevention education information provided.  The family member/significant other agrees to remove the items of safety concern listed above. The patient did not endorse SI at the time of admission, nor did the patient c/o SI during the stay here.  SPE not required.  Trish Mage 10/05/2017, 8:39 AM

## 2017-10-05 NOTE — Progress Notes (Signed)
Pt got up came into the hall" What time do I have to wait for my pain medicine ? " pt was informed it was scheduled for 800, pt was offered something to eat, " I don't want something to eat"

## 2017-10-05 NOTE — Progress Notes (Signed)
Pt stated that they told him over at the ER when he could have his medications, pt was informed that his medications were going to be given based on what the doctor orders. Pt continued to express that writer was not trying to give him his pain medication.

## 2017-10-05 NOTE — Progress Notes (Signed)
Pt up demanding pain medication, pt was explained that he received his 15 mg earlier at 2127. Pt that was scheduled.

## 2017-10-05 NOTE — Progress Notes (Signed)
Tried to explain that the pt is scheduled for 60 mg in 14 hours and scheduled for 0 mg for 10 hrs . Pt continues to argue with writer , just requesting pain medication when he wants it.

## 2017-10-05 NOTE — BHH Counselor (Signed)
Adult Comprehensive Assessment  Patient ID: [name], male   DOB: 1966-04-18, 52 y.o.   MRN: [id number]  Information Source: Information source: Patient  Current Stressors:  Employment / Job issues: Disability Family Stressors: complicated relationship with wife who he depends on for a lot yet they have been separated for over 28yrs Financial / Lack of resources (include bankruptcy): Fixed income Housing: Pt has lived in same apartment for over 7 years; "It's a regular old gossip mill." Physical health (include injuries & life threatening diseases): Chronic back pain, recent back surgery Substance abuse: Denies  Living/Environment/Situation:  Living Arrangements: Alone Living conditions (as described by patient or guardian): apartment in [place] How long has patient lived in current situation?:7years What is atmosphere in current home: Comfortable, Supportive  Family History:  Marital status:Separate; 64yrs- relationship with wife is complicated. Pt is still on her insurance and she transports him to many places Are you sexually active?: No What is your sexual orientation?: hetero Does patient have children?: Yes How many children?: 1 How is patient's relationship with their children?:distant relationship with daughter who is in college  Childhood History:  By whom was/is the patient raised?: Both parents Description of patient's relationship with caregiver when they were a child: not close with either Patient's description of current relationship with people who raised him/her: Parents both died 2 years Does patient have siblings?: Yes Number of Siblings: 1 Description of patient's current relationship with siblings: brother died of septic shock same year parents died.-"He was a [place]" Did patient suffer any verbal/emotional/physical/sexual abuse as a child?: No Did patient suffer from severe childhood neglect?: No Has patient ever been sexually  abused/assaulted/raped as an adolescent or adult?: No Was the patient ever a victim of a crime or a disaster?: No Witnessed domestic violence?: No Has patient been effected by domestic violence as an adult?: No  Education:  Highest grade of school patient has completed: 46, some college Currently a Ship broker?: No Learning disability?: No  Employment/Work Situation:  Employment situation: On disability Why is patient on disability: Medical and mental health How long has patient been on disability:6years What is the longest time patient has a held a job?: 6 years 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) Has patient ever served in combat?: No Did You Receive Any Psychiatric Treatment/Services While in Passenger transport manager?: No Are There Guns or Other Weapons in [place]?: No  Financial Resources:  Museum/gallery curator resources: [employer]; Medicaid; Private Insurance Does patient have a representative payee or guardian?: No  Alcohol/Substance Abuse:  What has been your use of drugs/alcohol within the last 12 months?: Denies Alcohol/Substance Abuse Treatment Hx: Denies past history Has alcohol/substance abuse ever caused legal problems?: No  Social Support System: Patient's Community Support System: Good Describe Community Support System: Wife and daughter Type of faith/religion: N/A How does patient's faith help to cope with current illness?: N/A  Leisure/Recreation:  Leisure and Hobbies: Watch old movies  Strengths/Needs:  What things does the patient do well?: Used to read, but my eyes are going bad In what areas does patient struggle / problems for patient: Keeping all meds straight-"Sometimes I like my pain medication too much, so my wife gives me one day at a time worth."  Discharge Plan:  Does patient have access to transportation?: Yes Will patient be returning to same  living situation after discharge?: Yes Currently receiving community mental health services: Yes (  From Whom) (Neuropsychiatric); Does patient have financial barriers related to discharge medications?: No   Summary/Recommendations:   Summary and Recommendations (to be completed by the evaluator): [name] is a 52 year old Caucasian male with a diagnosis of Bipolar Disorder, most recent episode depressed, severe, with psychosis.He presented to the ED with increased depression and command hallucinations. [name] is unable to identify any triggers for this episode.At d/c, he will return home and follow up with [place]. While here, [name] will benefit from crisis stabilization, medication evaluation, group therapy and psycho education in addition to case management for discharge planning.  Trish Mage. 10/05/2017

## 2017-10-06 DIAGNOSIS — G4700 Insomnia, unspecified: Secondary | ICD-10-CM

## 2017-10-06 DIAGNOSIS — F315 Bipolar disorder, current episode depressed, severe, with psychotic features: Principal | ICD-10-CM

## 2017-10-06 DIAGNOSIS — F419 Anxiety disorder, unspecified: Secondary | ICD-10-CM

## 2017-10-06 DIAGNOSIS — Z87891 Personal history of nicotine dependence: Secondary | ICD-10-CM

## 2017-10-06 NOTE — Plan of Care (Signed)
  Problem: Activity: Goal: Interest or engagement in activities will improve Outcome: Progressing   Problem: Safety: Goal: Periods of time without injury will increase Outcome: Progressing   DAR NOTE: Patient presents with calm affect and bright mood.  Pt stated he slept good last night, good appetite, normal energy, and good concentration. Denies pain at this time, denies  auditory and visual hallucinations.  Rates depression at 5, hopelessness at 3, and anxiety at 6.  Maintained on routine safety checks.  Medications given as prescribed.  Support and encouragement offered as needed.  Attended group and participated.  States goal for today is "preparing to go home."  Patient observed socializing with peers in the dayroom.  Offered no complaint.

## 2017-10-06 NOTE — Progress Notes (Signed)
[facility] MD Progress Note  10/06/2017 3:32 PM [name]  MRN:  [id number]  Subjective: [name] reports, "I'm doing a lot better today. I slept like a rock last night. The voices & the shadows are still there, but, the voices are not as intense. I'm still seeing about 5-6 people in the room looking at me. The depression has improved because I'm around a lot of people here. I realized now that my depression is caused by loneliness because I live alone in my apartment. I need to get out of here by Friday to go for my daughter's graduation on Friday".  [name] is a 52 y/o M with history of Bipolar I who was admitted voluntarily from [place] where he presented with worsening anxiety, VH, and CAH of multiple people telling him to kill himself. Pt had recent cervical spine surgery on 09/21/17 and reported gradual onset of symptoms after that incident. He was medically cleared and transferred to [facility] for additional treatment and stabilization. Upon initial interview, pt shares, "I just started hallucinating. It started out as little things - like shadows in the corners of my vision, and then it just escalated." Pt describes AH/VH of seeing and hearing "About 7 people in the room all screaming. Some of them are screaming at me to kill myself.  Today 10-06-17, [name] is seen. Chart reviewed. The chart findings discussed with the treatment team. He presents alert, oriented & aware of situation. He is visible on the unit, attending group sessions. He is ambulating with the aid of a walker, wearing a neck brace. His affect is good. He reports improved mood. Continues to complain of auditory/visual hallucinations, however, says the voices are not as intense as when he first admitted. He says he still sees some people in his room, about 5-6 people looking at him. He says he realized now that he is depressed because of loneliness as he lives alone in his apartment. He says he is hoping to get discharged by Friday of this week  as he would not want to miss his daughter's graduation. He denies any SIHI, delusional thoughts or paranoia. He is tolerating his medication regimen. Denies any adverse effects. He ia in agreement to continue current plan of care already in progress.   Principal Problem: Bipolar I disorder, most recent episode depressed, severe with psychotic features ([place])  Diagnosis:   Patient Active Problem List   Diagnosis Date Noted  . Bipolar I disorder, most recent episode depressed, severe with psychotic features ([place]) [F31.5] 10/04/2017  . Spinal stenosis of cervical region [M48.02] 09/21/2017  . Chronic pain syndrome [G89.4] 10/28/2016  . Cannabis use disorder, mild, abuse [F12.10] 10/28/2016  . Rhabdomyolysis [M62.82] 08/23/2016  . AKI (acute kidney injury) ([place]) [N17.9] 08/23/2016  . Transaminitis [R74.0] 08/23/2016  . Drug overdose, multiple drugs [T50.901A] 08/23/2016  . Tachycardia [R00.0] 08/23/2016  . Accelerated hypertension [I10] 08/23/2016  . Bipolar disorder, curr episode mixed, severe, with psychotic features ([place]) [F31.64] 12/26/2015  . Cocaine use disorder, mild, abuse ([place]) [F14.10] 12/26/2015  . History of rhabdomyolysis [Z87.39] 12/26/2015  . Liver injury [S36.119A] 12/26/2015  . Hx of renal failure [Z87.448] 12/26/2015  . Angina, class III ([place]) [I20.9] 08/18/2015  . Dyslipidemia, goal LDL below 70 [E78.5] 08/18/2015  . Family history of premature coronary artery disease [Z82.49] 08/18/2015  . Tobacco use disorder, continuous [F17.209] 08/18/2015  . Tibial plateau fracture [S82.143A] 10/02/2013  . Multiple falls [R29.6] 10/02/2013  . Alteration in self-care ability [R53.81] 10/02/2013  .  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 Fracture of tibia with fibula, left, closed [S82.202A, S82.402A] 10/02/2013  . Chronic back pain [M54.9, G89.29] 10/02/2013  . 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 History of suicidal ideation [Z86.59]  10/02/2013  . Postural imbalance with levoscoliosis, h/o [R29.3] 10/02/2013  . Degenerative disc disease, lumbar [M51.36] 09/07/2013  . Chest pain [R07.9] 12/25/2012  . Leg pain [M79.606] 02/24/2011  . Neck pain [M54.2] 02/24/2011  . Right hip pain [M25.551] 02/24/2011  . IRRITABLE BOWEL SYNDROME [K58.9] 08/09/2008  . HYPERLIPIDEMIA [E78.5] 10/28/2006  . DISORDER, BIPOLAR NOS [F31.9] 10/28/2006  . ANXIETY [F41.1] 10/28/2006  . DEPRESSION [F32.9] 10/28/2006  . PAIN, CHRONIC NEC [G89.29] 10/28/2006  . GERD [K21.9] 10/28/2006   Total Time spent with patient: 25 minutes  Past Psychiatric History: See H&P  Past Medical History:  Past Medical History:  Diagnosis Date  . Accelerated hypertension 08/23/2016   pt denies this  . Anxiety   . Arthritis   . Bipolar affective disorder ([place])   . Chronic back pain   . Chronic kidney disease 2017   AKF - due to Rhabdomyolysis  . DDD (degenerative disc disease)   . Depression   . DJD (degenerative joint disease)   . Elevated liver function tests   . Fibromyalgia   . GERD (gastroesophageal reflux disease)   . Head injury   . Head injury, closed, with concussion    x 3  from falls  . Hyperlipidemia   . Hypothyroidism   . IBS (irritable bowel syndrome)    with diarrhea  . Inguinal hernia    left  . Insomnia   . Myofascial pain syndrome   . Osteopenia   . Pneumonia   . Shortness of breath dyspnea    with exertion  . Tibial plateau fracture, left   . Tremors of nervous system     Past Surgical History:  Procedure Laterality Date  . ANTERIOR CERVICAL DECOMP/DISCECTOMY FUSION N/A 09/21/2017   Procedure: ANTERIOR CERVICAL DECOMPRESSION/DISCECTOMY FUSION - CERVICAL THREE-CERVICAL FOUR;  Surgeon: Kary Kos, MD;  Location: [facility];  Service: Neurosurgery;  Laterality: N/A;  . CARDIAC CATHETERIZATION N/A 08/31/2015   Procedure: Left Heart Cath and Coronary Angiography;  Surgeon: Leonie Man, MD;  Location: [facility] CV LAB;  Service:  Cardiovascular;  Laterality: N/A;  . Carotid Dopplers Bilateral 02/20/2015   [facility]: Mild, less than 39% left and right internal carotid artery stenosis. No significant plaque burden  . CERVICAL DISC SURGERY     C5-7  . COLONOSCOPY    . COLONOSCOPY    . ESOPHAGOGASTRODUODENOSCOPY    . FASCIOTOMY Left 10/20/2013   Procedure: LEFT leg ANTERIOR COMPARTMENT FACSCIOTOMY;  Surgeon: Rozanna Box, MD;  Location: [facility];  Service: Orthopedics;  Laterality: Left;  . INGUINAL HERNIA REPAIR Left   . Nuclear Stress Test  03/06/2015   [facility]: Normal EKG. Low normal EF (49%) normal regional wall motion. No evidence of ischemia or infarction.  . ORIF TIBIA PLATEAU Left 10/20/2013   Procedure: OPEN REDUCTION INTERNAL FIXATION (ORIF) LEFT TIBIAL PLATEAU;  Surgeon: Rozanna Box, MD;  Location: [facility];  Service: Orthopedics;  Laterality: Left;  . SEPTOPLASTY    . SPINAL CORD STIMULATOR BATTERY EXCHANGE N/A 02/08/2016   Procedure: Lumbar spinal cord stimulator implantable pulse generator replacement;  Surgeon: Clydell Hakim, MD;  Location: [facility]UR[facility];  Service: Neurosurgery;  Laterality: N/A;  . SPINAL CORD STIMULATOR IMPLANT    .  TRANSTHORACIC ECHOCARDIOGRAM  02/20/2015   [facility]: Mild concentric LVH. EF 55-60%. Normal regional wall motion. Mild to moderate TR with no significant pulmonary hypertension.   Family History:  Family History  Problem Relation Age of Onset  . Heart disease Mother   . Hypertension Mother   . Sudden death Mother        Presumably cardiac  . Hyperlipidemia Father   . Heart attack Father 30       At least 5 MIs. Had CABG.  . Heart failure Father   . Diabetes Father   . Hypertension Father   . Kidney disease Father   . Diabetes Brother   . Lung cancer Maternal Grandmother   . Diabetes Paternal Grandmother   . Sudden death Paternal Grandmother        Unclear etiology  . Diabetes Unknown   . Kidney disease Unknown   . Kidney  disease Brother   . Hypertension Brother   . Sudden death Brother        Thought to be related to heart disease  . Throat cancer Brother   . Colon cancer Neg Hx   . Mental illness Neg Hx    Family Psychiatric  History: See H&P.  Social History:  Social History   Substance and Sexual Activity  Alcohol Use No  . Alcohol/week: 0.0 oz   Comment: none since 1999- heavy drinker in past     Social History   Substance and Sexual Activity  Drug Use Yes  . Types: Oxycodone, Cocaine   Comment: rarely- last 2015-    Social History   Socioeconomic History  . Marital status: Married    Spouse name: Not on file  . Number of children: 1  . Years of education: Not on file  . Highest education level: Not on file  Occupational History  . Occupation: disabled  Social Needs  . Financial resource strain: Not on file  . Food insecurity:    Worry: Not on file    Inability: Not on file  . Transportation needs:    Medical: Not on file    Non-medical: Not on file  Tobacco Use  . Smoking status: Former Smoker    Packs/day: 1.00    Years: 35.00    Pack years: 35.00    Types: Cigarettes    Last attempt to quit: 08/14/2017    Years since quitting: 0.1  . Smokeless tobacco: Never Used  . Tobacco comment: 2018  Substance and Sexual Activity  . Alcohol use: No    Alcohol/week: 0.0 oz    Comment: none since 1999- heavy drinker in past  . Drug use: Yes    Types: Oxycodone, Cocaine    Comment: rarely- last 2015-  . Sexual activity: Not on file  Lifestyle  . Physical activity:    Days per week: Not on file    Minutes per session: Not on file  . Stress: Not on file  Relationships  . Social connections:    Talks on phone: Not on file    Gets together: Not on file    Attends religious service: Not on file    Active member of club or organization: Not on file    Attends meetings of clubs or organizations: Not on file    Relationship status: Not on file  Other Topics Concern  . Not on  file  Social History Narrative   He is a separated father of 1. His daughter who is in nursing  school lives with him part-time.   He is currently disabled due to chronic back pain.    He did some college education.   He currently is trying to use electronic cigarettes but has smoked one pack a day for over 35 years.   He has recently stopped using prescription benzodiazepine and pain medications because he is in the process of changing pain med doctors.   He does not routinely exercise secondary to back, neck pain as well as dyspnea.   Additional Social History:    Pain Medications: none Prescriptions: none Over the Counter: none History of alcohol / drug use?: No history of alcohol / drug abuse  Sleep: Good  Appetite:  Good  Current Medications: Current Facility-Administered Medications  Medication Dose Route Frequency Provider Last Rate Last Dose  . acetaminophen (TYLENOL) tablet 650 mg  650 mg Oral Q6H PRN Patrecia Pour, NP      . alum & mag hydroxide-simeth (MAALOX/MYLANTA) 200-200-20 MG/5ML suspension 30 mL  30 mL Oral Q6H PRN Patrecia Pour, NP      . amitriptyline (ELAVIL) tablet 75 mg  75 mg Oral QHS Patrecia Pour, NP   75 mg at 10/05/17 2151  . aspirin EC tablet 81 mg  81 mg Oral Daily Patrecia Pour, NP   81 mg at 10/06/17 0805  . benztropine (COGENTIN) tablet 0.5 mg  0.5 mg Oral BID PRN Maris Berger T, MD      . buPROPion (WELLBUTRIN XL) 24 hr tablet 150 mg  150 mg Oral Daily Patrecia Pour, NP   150 mg at 10/06/17 0805  . busPIRone (BUSPAR) tablet 15 mg  15 mg Oral BID Patrecia Pour, NP   15 mg at 10/06/17 0805  . cyclobenzaprine (FLEXERIL) tablet 10 mg  10 mg Oral TID PRN Patrecia Pour, NP   10 mg at 10/05/17 2151  . hydrOXYzine (ATARAX/VISTARIL) tablet 50 mg  50 mg Oral TID Patrecia Pour, NP   50 mg at 10/06/17 1206  . lamoTRIgine (LAMICTAL) tablet 50 mg  50 mg Oral BID Patrecia Pour, NP   50 mg at 10/06/17 0806  . levothyroxine (SYNTHROID,  LEVOTHROID) tablet 50 mcg  50 mcg Oral QAC breakfast Patrecia Pour, NP   50 mcg at 10/06/17 1962  . magnesium hydroxide (MILK OF MAGNESIA) suspension 30 mL  30 mL Oral Daily PRN Patrecia Pour, NP      . nicotine (NICODERM CQ - dosed in mg/24 hours) patch 14 mg  14 mg Transdermal Daily Cobos, Myer Peer, MD   14 mg at 10/06/17 0806  . nitroGLYCERIN (NITROSTAT) SL tablet 0.4 mg  0.4 mg Sublingual Q5 min PRN Patrecia Pour, NP      . OLANZapine (ZYPREXA) tablet 20 mg  20 mg Oral QHS Pennelope Bracken, MD   20 mg at 10/05/17 2151  . ondansetron (ZOFRAN) tablet 4 mg  4 mg Oral Q8H PRN Patrecia Pour, NP      . oxyCODONE (Oxy IR/ROXICODONE) immediate release tablet 15 mg  15 mg Oral QID Pennelope Bracken, MD   15 mg at 10/06/17 1206  . pantoprazole (PROTONIX) EC tablet 40 mg  40 mg Oral Q1200 Patrecia Pour, NP   40 mg at 10/06/17 1206  . promethazine (PHENERGAN) tablet 25 mg  25 mg Oral Q6H PRN Patrecia Pour, NP      . Derrill Memo ON 10/08/2017] Vitamin D (Ergocalciferol) (DRISDOL) capsule 50,000 Units  50,000 Units Oral Q Ladora Daniel, NP       Lab Results: No results found for this or any previous visit (from the past 48 hour(s)).  Blood Alcohol level:  Lab Results  Component Value Date   ETH <10 10/03/2017   ETH <5 54/62/7035    Metabolic Disorder Labs: Lab Results  Component Value Date   HGBA1C 4.6 (L) 10/29/2016   MPG 85 10/29/2016   MPG 103 12/26/2015   Lab Results  Component Value Date   PROLACTIN 27.7 (H) 10/29/2016   Lab Results  Component Value Date   CHOL 245 (H) 10/29/2016   TRIG 195 (H) 10/29/2016   HDL 47 10/29/2016   CHOLHDL 5.2 10/29/2016   VLDL 39 10/29/2016   LDLCALC 159 (H) 10/29/2016   LDLCALC 176 (H) 12/27/2015    Physical Findings: AIMS:  , ,  ,  ,    CIWA:    COWS:     Musculoskeletal: Strength & Muscle Tone: within normal limits Gait & Station: Unsteady, weak, uses a walker. Patient leans: N/A  Psychiatric Specialty  Exam: Physical Exam  Review of Systems  Psychiatric/Behavioral: Positive for depression ("Improving") and hallucinations ("Improving"). Negative for memory loss and suicidal ideas. Substance abuse: Hx. Opioid use. The patient is not nervous/anxious and does not have insomnia.     Blood pressure 104/69, pulse 96, temperature 98.2 F (36.8 C), temperature source Oral, resp. rate 16, height 6' (1.829 m), weight 97.1 kg (214 lb).Body mass index is 29.02 kg/m.  General Appearance: Casual and Fairly Groomed  Eye Contact:  Good  Speech:  Clear and Coherent and Normal Rate  Volume:  Normal  Mood:  Anxious  Affect:  Congruent and Constricted  Thought Process:  Coherent and Goal Directed  Orientation:  Full (Time, Place, and Person)  Thought Content:  Hallucinations: Auditory Visual  Suicidal Thoughts:  No  Homicidal Thoughts:  No  Memory:  Immediate;   Fair Recent;   Fair Remote;   Fair  Judgement:  Fair  Insight:  Lacking  Psychomotor Activity:  Normal  Concentration:  Concentration: Fair  Recall:  [employer] of Knowledge:  Fair  Language:  Fair  Akathisia:  No  Handed:    AIMS (if indicated):     Assets:  Desire for Improvement Housing Resilience Social Support  ADL's:  Intact  Cognition:  WNL  Sleep:  Number of Hours: 6.5     Treatment Plan Summary: Daily contact with patient to assess and evaluate symptoms and progress in treatment.  - Continue inpatient hospitalization.  - Will continue today 10/06/2017 plan as below except where it is noted.  Depression/insomnia.      - Continue amitriptyline 75 mg po Q hs.      - Continue Wellbutrin XL 150 mg po daily.  EPS.      - Continue Cogentin 0.5 mg po bid prn.  Anxiety.      - Continue Buspar 15 mg po bid.      - Continue Hydroxyzine 50 mg po Q tid.  Mood stabilization.       - Continue Lamictal 50 mg po bid.  Mood control.       - Continue Zyprexa 20 mg po Q hs.  Other medical issues.      - Continue ASA 81 mg  po daily for heart health.      - Continue Flexeril 10 mg po tid prn for muscle spasms.      -  Continue Synthroid 50 mcg po daily for hypothyroidism      - Continue NTG 4 mg po Q 5 minutes x 3 prn for cp.      - Continue OxyCodone IR 15 mg po Qid  For pain.      - Continue Protonix 40 mg po Q am for GERD.      - Continue Drisdol 50, 000 units po Q Thursdays for low CA.       - Phenergan 25 mg po Q 6 hours prn for nausea.           - Patient to continue to participate in the group sessions.       - Discharge dispositon plan ongoing.  Lindell Spar, NP, PMHNP, FNP-BC. 10/06/2017, 3:32 PM

## 2017-10-06 NOTE — Plan of Care (Signed)
D: Pt denies SI/HI , AVH- still there. Pt is pleasant and cooperative. Pt apologized for his actions last night, pt has been appropriate on the unit this evening A: Pt was offered support and encouragement. Pt was given scheduled medications. Pt was encourage to attend groups. Q 15 minute checks were done for safety.  R:Pt attends groups and interacts well with peers and staff. Pt is taking medication. Pt has no complaints.Pt receptive to treatment and safety maintained on unit.   Problem: Education: Goal: Emotional status will improve Outcome: Progressing   Problem: Education: Goal: Mental status will improve Outcome: Progressing   Problem: Coping: Goal: Ability to demonstrate self-control will improve Outcome: Progressing   Problem: Safety: Goal: Periods of time without injury will increase Outcome: Progressing

## 2017-10-06 NOTE — [facility] Group Notes (Signed)
LCSW Group Therapy Note  10/06/2017 1:15pm  Type of Therapy/Topic:  Group Therapy:  Feelings about Diagnosis  Participation Level:  Active   Description of Group:   This group will allow patients to explore their thoughts and feelings about diagnoses they have received. Patients will be guided to explore their level of understanding and acceptance of these diagnoses. Facilitator will encourage patients to process their thoughts and feelings about the reactions of others to their diagnosis and will guide patients in identifying ways to discuss their diagnosis with significant others in their lives. This group will be process-oriented, with patients participating in exploration of their own experiences, giving and receiving support, and processing challenge from other group members.   Therapeutic Goals: 1. Patient will demonstrate understanding of diagnosis as evidenced by identifying two or more symptoms of the disorder 2. Patient will be able to express two feelings regarding the diagnosis 3. Patient will demonstrate their ability to communicate their needs through discussion and/or role play  Summary of Patient Progress:  Stayed the entire time, engaged throughout.  Identified  his family as a good example of community "where everyone bonds together and is there for each other."  Went on to talk about how they had family reunions until about 5 years ago when all the aunts and uncles started dying off, and how he took his 63 yo daughter to meet family last summer.  Also identified his home community as a supportive atmosphere, while admitting it was very isolated and closed, and how much he had to learn leaving there and finding the world was not like that.     Therapeutic Modalities:   Cognitive Behavioral Therapy Brief Therapy Feelings Identification    Trish Mage, LCSW 10/06/2017 1:32 PM

## 2017-10-06 NOTE — Plan of Care (Signed)
D: Pt denies SI/HI/AVH. Pt is pleasant and cooperative. Pt stated he was doing better.   A: Pt was offered support and encouragement. Pt was given scheduled medications. Pt was encourage to attend groups. Q 15 minute checks were done for safety.   R:Pt attends groups and interacts well with peers and staff. Pt is taking medication. Pt receptive to treatment and safety maintained on unit.   Problem: Education: Goal: Emotional status will improve Outcome: Progressing   Problem: Safety: Goal: Periods of time without injury will increase Outcome: Progressing

## 2017-10-06 NOTE — Progress Notes (Signed)
Recreation Therapy Notes  Date: 5.7.19 Time: 0945 Location: 500 Hall Dayroom  Group Topic: Wellness  Goal Area(s) Addresses:  Patient will define components of whole wellness. Patient will verbalize benefit of whole wellness.  Behavioral Response: Engaged  Intervention: Music  Activity: Exercise.  LRT led group in four rounds of exercises.  LRT let each patient pick an exercise to lead the group in.  Education: Wellness, Dentist.   Education Outcome: Acknowledges education/In group clarification offered/Needs additional education.   Clinical Observations/Feedback:  Pt was fully engaged.  Pt did all the exercises to the best of his ability.  Pt expressed most don't make time for exercise.  Pt also stated when you don't exercise "you feel crappy".   Victorino Sparrow, LRT/CTRS         Victorino Sparrow A 10/06/2017 10:43 AM

## 2017-10-07 NOTE — Progress Notes (Signed)
Recreation Therapy Notes  Date: 5.8.19 Time: 1000 Location: 500 Hall Dayroom  Group Topic: Self-Esteem  Goal Area(s) Addresses:  Patient will successfully identify positive attributes about themselves.  Patient will successfully identify benefit of improved self-esteem.   Behavioral Response: Engaged  Intervention: Markers, blank crest  Activity: [place].  Patients were to identify things that mean something to them.  Patients could highlight their biggest accomplishments, proudest moment, best feature, something they are good at or something they value.  Education:  Self-Esteem, Dentist.   Education Outcome: Acknowledges education/In group clarification offered/Needs additional education  Clinical Observations/Feedback: Pt was engaged and active during group.  Pt stated his personality was his best feature; his daughter getting into the doctorate program is his proudest moment; watching sports is his favorite activity and his greatest accomplishment was living to see 51.  Pt stated the activity was hard for him because "I'm hard on myself".    Victorino Sparrow, LRT/CTRS      Ria Comment, Brayden Brodhead A 10/07/2017 10:47 AM

## 2017-10-07 NOTE — Progress Notes (Signed)
[facility] MD Progress Note  10/07/2017 1:55 PM [name]  MRN:  [id number]  Subjective: [name] reports, "I'm doing really good today. I slept well last night. It is going pretty well. The voices & seeing shadows are now subsiding. I'm still seeing few people in the room, not as bad. I feel safer. I think I'm on the right path now. I will be ready to go home tomorrow. But, my wife will not be able to pick me up from here until about 3:00 or 4:00 pm".  [name] is a 52 y/o M with history of Bipolar I who was admitted voluntarily from [place] where he presented with worsening anxiety, VH, and CAH of multiple people telling him to kill himself. Pt had recent cervical spine surgery on 09/21/17 and reported gradual onset of symptoms after that incident. He was medically cleared and transferred to [facility] for additional treatment and stabilization. Upon initial interview, pt shares, "I just started hallucinating. It started out as little things - like shadows in the corners of my vision, and then it just escalated." Pt describes AH/VH of seeing and hearing "About 7 people in the room all screaming. Some of them are screaming at me to kill myself.  Today 10-07-17, [name] is seen. Chart reviewed. The chart findings discussed with the treatment team. He presents alert, oriented & aware of situation. He is visible on the unit, attending group sessions. He is ambulating with the aid of a walker, wearing a neck brace. His affect is good. He reports improved mood & AVH. He says he is hoping to get discharged by tomorrow 10-08-17. He denies any SIHI, delusional thoughts or paranoia. He is tolerating his medication regimen. Denies any adverse effects. He ia in agreement to continue current plan of care already in progress.   Principal Problem: Bipolar I disorder, most recent episode depressed, severe with psychotic features ([place])  Diagnosis:   Patient Active Problem List   Diagnosis Date Noted  . Bipolar I disorder, most  recent episode depressed, severe with psychotic features ([place]) [F31.5] 10/04/2017  . Spinal stenosis of cervical region [M48.02] 09/21/2017  . Chronic pain syndrome [G89.4] 10/28/2016  . Cannabis use disorder, mild, abuse [F12.10] 10/28/2016  . Rhabdomyolysis [M62.82] 08/23/2016  . AKI (acute kidney injury) ([place]) [N17.9] 08/23/2016  . Transaminitis [R74.0] 08/23/2016  . Drug overdose, multiple drugs [T50.901A] 08/23/2016  . Tachycardia [R00.0] 08/23/2016  . Accelerated hypertension [I10] 08/23/2016  . Bipolar disorder, curr episode mixed, severe, with psychotic features ([place]) [F31.64] 12/26/2015  . Cocaine use disorder, mild, abuse ([place]) [F14.10] 12/26/2015  . History of rhabdomyolysis [Z87.39] 12/26/2015  . Liver injury [S36.119A] 12/26/2015  . Hx of renal failure [Z87.448] 12/26/2015  . Angina, class III ([place]) [I20.9] 08/18/2015  . Dyslipidemia, goal LDL below 70 [E78.5] 08/18/2015  . Family history of premature coronary artery disease [Z82.49] 08/18/2015  . Tobacco use disorder, continuous [F17.209] 08/18/2015  . Tibial plateau fracture [S82.143A] 10/02/2013  . Multiple falls [R29.6] 10/02/2013  . Alteration in self-care ability [R53.81] 10/02/2013  . 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 Fracture of tibia with fibula, left, closed [S82.202A, S82.402A] 10/02/2013  . Chronic back pain [M54.9, G89.29] 10/02/2013  . 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 History of suicidal ideation [Z86.59] 10/02/2013  . Postural imbalance with levoscoliosis, h/o [R29.3] 10/02/2013  . Degenerative disc disease, lumbar [M51.36] 09/07/2013  . Chest pain [R07.9] 12/25/2012  . Leg pain [M79.606] 02/24/2011  . Neck pain [M54.2] 02/24/2011  .  Right hip pain [M25.551] 02/24/2011  . IRRITABLE BOWEL SYNDROME [K58.9] 08/09/2008  . HYPERLIPIDEMIA [E78.5] 10/28/2006  . DISORDER, BIPOLAR NOS [F31.9] 10/28/2006  . ANXIETY [F41.1] 10/28/2006  . DEPRESSION [F32.9]  10/28/2006  . PAIN, CHRONIC NEC [G89.29] 10/28/2006  . GERD [K21.9] 10/28/2006   Total Time spent with patient: 25 minutes  Past Psychiatric History: See H&P  Past Medical History:  Past Medical History:  Diagnosis Date  . Accelerated hypertension 08/23/2016   pt denies this  . Anxiety   . Arthritis   . Bipolar affective disorder ([place])   . Chronic back pain   . Chronic kidney disease 2017   AKF - due to Rhabdomyolysis  . DDD (degenerative disc disease)   . Depression   . DJD (degenerative joint disease)   . Elevated liver function tests   . Fibromyalgia   . GERD (gastroesophageal reflux disease)   . Head injury   . Head injury, closed, with concussion    x 3  from falls  . Hyperlipidemia   . Hypothyroidism   . IBS (irritable bowel syndrome)    with diarrhea  . Inguinal hernia    left  . Insomnia   . Myofascial pain syndrome   . Osteopenia   . Pneumonia   . Shortness of breath dyspnea    with exertion  . Tibial plateau fracture, left   . Tremors of nervous system     Past Surgical History:  Procedure Laterality Date  . ANTERIOR CERVICAL DECOMP/DISCECTOMY FUSION N/A 09/21/2017   Procedure: ANTERIOR CERVICAL DECOMPRESSION/DISCECTOMY FUSION - CERVICAL THREE-CERVICAL FOUR;  Surgeon: Kary Kos, MD;  Location: [facility];  Service: Neurosurgery;  Laterality: N/A;  . CARDIAC CATHETERIZATION N/A 08/31/2015   Procedure: Left Heart Cath and Coronary Angiography;  Surgeon: Leonie Man, MD;  Location: [facility];  Service: Cardiovascular;  Laterality: N/A;  . Carotid Dopplers Bilateral 02/20/2015   [facility]: Mild, less than 39% left and right internal carotid artery stenosis. No significant plaque burden  . CERVICAL DISC SURGERY     C5-7  . COLONOSCOPY    . COLONOSCOPY    . ESOPHAGOGASTRODUODENOSCOPY    . FASCIOTOMY Left 10/20/2013   Procedure: LEFT leg ANTERIOR COMPARTMENT FACSCIOTOMY;  Surgeon: Rozanna Box, MD;  Location: [place];  Service:  Orthopedics;  Laterality: Left;  . INGUINAL HERNIA REPAIR Left   . Nuclear Stress Test  03/06/2015   [facility]: Normal EKG. Low normal EF (49%) normal regional wall motion. No evidence of ischemia or infarction.  . ORIF TIBIA PLATEAU Left 10/20/2013   Procedure: OPEN REDUCTION INTERNAL FIXATION (ORIF) LEFT TIBIAL PLATEAU;  Surgeon: Rozanna Box, MD;  Location: [place];  Service: Orthopedics;  Laterality: Left;  . SEPTOPLASTY    . SPINAL CORD STIMULATOR BATTERY EXCHANGE N/A 02/08/2016   Procedure: Lumbar spinal cord stimulator implantable pulse generator replacement;  Surgeon: Clydell Hakim, MD;  Location: [place] [facility];  Service: Neurosurgery;  Laterality: N/A;  . SPINAL CORD STIMULATOR IMPLANT    . TRANSTHORACIC ECHOCARDIOGRAM  02/20/2015   [facility]: Mild concentric LVH. EF 55-60%. Normal regional wall motion. Mild to moderate TR with no significant pulmonary hypertension.   Family History:  Family History  Problem Relation Age of Onset  . Heart disease Mother   . Hypertension Mother   . Sudden death Mother        Presumably cardiac  . Hyperlipidemia Father   . Heart attack Father 32  At least 5 MIs. Had CABG.  . Heart failure Father   . Diabetes Father   . Hypertension Father   . Kidney disease Father   . Diabetes Brother   . Lung cancer Maternal Grandmother   . Diabetes Paternal Grandmother   . Sudden death Paternal Grandmother        Unclear etiology  . Diabetes Unknown   . Kidney disease Unknown   . Kidney disease Brother   . Hypertension Brother   . Sudden death Brother        Thought to be related to heart disease  . Throat cancer Brother   . Colon cancer Neg Hx   . Mental illness Neg Hx    Family Psychiatric  History: See H&P.  Social History:  Social History   Substance and Sexual Activity  Alcohol Use No  . Alcohol/week: 0.0 oz   Comment: none since 1999- heavy drinker in past     Social History   Substance and Sexual  Activity  Drug Use Yes  . Types: Oxycodone, Cocaine   Comment: rarely- last 2015-    Social History   Socioeconomic History  . Marital status: Married    Spouse name: Not on file  . Number of children: 1  . Years of education: Not on file  . Highest education level: Not on file  Occupational History  . Occupation: disabled  Social Needs  . Financial resource strain: Not on file  . Food insecurity:    Worry: Not on file    Inability: Not on file  . Transportation needs:    Medical: Not on file    Non-medical: Not on file  Tobacco Use  . Smoking status: Former Smoker    Packs/day: 1.00    Years: 35.00    Pack years: 35.00    Types: Cigarettes    Last attempt to quit: 08/14/2017    Years since quitting: 0.1  . Smokeless tobacco: Never Used  . Tobacco comment: 2018  Substance and Sexual Activity  . Alcohol use: No    Alcohol/week: 0.0 oz    Comment: none since 1999- heavy drinker in past  . Drug use: Yes    Types: Oxycodone, Cocaine    Comment: rarely- last 2015-  . Sexual activity: Not on file  Lifestyle  . Physical activity:    Days per week: Not on file    Minutes per session: Not on file  . Stress: Not on file  Relationships  . Social connections:    Talks on phone: Not on file    Gets together: Not on file    Attends religious service: Not on file    Active member of club or organization: Not on file    Attends meetings of clubs or organizations: Not on file    Relationship status: Not on file  Other Topics Concern  . Not on file  Social History Narrative   He is a separated father of 1. His daughter who is in nursing school lives with him part-time.   He is currently disabled due to chronic back pain.    He did some college education.   He currently is trying to use electronic cigarettes but has smoked one pack a day for over 35 years.   He has recently stopped using prescription benzodiazepine and pain medications because he is in the process of changing  pain med doctors.   He does not routinely exercise secondary to back, neck pain as well  as dyspnea.   Additional Social History:    Pain Medications: none Prescriptions: none Over the Counter: none History of alcohol / drug use?: No history of alcohol / drug abuse  Sleep: Good  Appetite:  Good  Current Medications: Current Facility-Administered Medications  Medication Dose Route Frequency Provider Last Rate Last Dose  . acetaminophen (TYLENOL) tablet 650 mg  650 mg Oral Q6H PRN Patrecia Pour, NP      . alum & mag hydroxide-simeth (MAALOX/MYLANTA) 200-200-20 MG/5ML suspension 30 mL  30 mL Oral Q6H PRN Patrecia Pour, NP      . amitriptyline (ELAVIL) tablet 75 mg  75 mg Oral QHS Patrecia Pour, NP   75 mg at 10/06/17 2203  . aspirin EC tablet 81 mg  81 mg Oral Daily Patrecia Pour, NP   81 mg at 10/07/17 0748  . benztropine (COGENTIN) tablet 0.5 mg  0.5 mg Oral BID PRN Pennelope Bracken, MD   0.5 mg at 10/06/17 2203  . buPROPion (WELLBUTRIN XL) 24 hr tablet 150 mg  150 mg Oral Daily Patrecia Pour, NP   150 mg at 10/07/17 0748  . busPIRone (BUSPAR) tablet 15 mg  15 mg Oral BID Patrecia Pour, NP   15 mg at 10/07/17 0748  . cyclobenzaprine (FLEXERIL) tablet 10 mg  10 mg Oral TID PRN Patrecia Pour, NP   10 mg at 10/05/17 2151  . hydrOXYzine (ATARAX/VISTARIL) tablet 50 mg  50 mg Oral TID Patrecia Pour, NP   50 mg at 10/07/17 1156  . lamoTRIgine (LAMICTAL) tablet 50 mg  50 mg Oral BID Patrecia Pour, NP   50 mg at 10/07/17 0748  . levothyroxine (SYNTHROID, LEVOTHROID) tablet 50 mcg  50 mcg Oral QAC breakfast Patrecia Pour, NP   50 mcg at 10/07/17 7829  . magnesium hydroxide (MILK OF MAGNESIA) suspension 30 mL  30 mL Oral Daily PRN Patrecia Pour, NP      . nicotine (NICODERM CQ - dosed in mg/24 hours) patch 14 mg  14 mg Transdermal Daily Cobos, Myer Peer, MD   14 mg at 10/07/17 0749  . nitroGLYCERIN (NITROSTAT) SL tablet 0.4 mg  0.4 mg Sublingual Q5 min PRN Patrecia Pour, NP      . OLANZapine (ZYPREXA) tablet 20 mg  20 mg Oral QHS Pennelope Bracken, MD   20 mg at 10/06/17 2203  . ondansetron (ZOFRAN) tablet 4 mg  4 mg Oral Q8H PRN Patrecia Pour, NP      . oxyCODONE (Oxy IR/ROXICODONE) immediate release tablet 15 mg  15 mg Oral QID Pennelope Bracken, MD   15 mg at 10/07/17 1155  . pantoprazole (PROTONIX) EC tablet 40 mg  40 mg Oral Q1200 Patrecia Pour, NP   40 mg at 10/07/17 1156  . promethazine (PHENERGAN) tablet 25 mg  25 mg Oral Q6H PRN Patrecia Pour, NP      . Derrill Memo ON 10/08/2017] Vitamin D (Ergocalciferol) (DRISDOL) capsule 50,000 Units  50,000 Units Oral Q Ladora Daniel, NP       Lab Results: No results found for this or any previous visit (from the past 48 hour(s)).  Blood Alcohol level:  Lab Results  Component Value Date   [facility] <10 10/03/2017   ETH <5 56/21/3086    Metabolic Disorder Labs: Lab Results  Component Value Date   HGBA1C 4.6 (L) 10/29/2016   MPG 85 10/29/2016   MPG 103  12/26/2015   Lab Results  Component Value Date   PROLACTIN 27.7 (H) 10/29/2016   Lab Results  Component Value Date   CHOL 245 (H) 10/29/2016   TRIG 195 (H) 10/29/2016   HDL 47 10/29/2016   CHOLHDL 5.2 10/29/2016   VLDL 39 10/29/2016   LDLCALC 159 (H) 10/29/2016   LDLCALC 176 (H) 12/27/2015    Physical Findings: AIMS:  , ,  ,  ,    CIWA:    COWS:     Musculoskeletal: Strength & Muscle Tone: within normal limits Gait & Station: Unsteady, weak, uses a walker. Patient leans: N/A  Psychiatric Specialty Exam: Physical Exam  Nursing note and vitals reviewed.   Review of Systems  Psychiatric/Behavioral: Positive for depression ("Improving") and hallucinations ("Improving"). Negative for memory loss and suicidal ideas. Substance abuse: Hx. Opioid use. The patient is not nervous/anxious and does not have insomnia.     Blood pressure 117/80, pulse 93, temperature 98.2 F (36.8 C), temperature source Oral, resp. rate 18,  height 6' (1.829 m), weight 97.1 kg (214 lb).Body mass index is 29.02 kg/m.  General Appearance: Casual and Fairly Groomed  Eye Contact:  Good  Speech:  Clear and Coherent and Normal Rate  Volume:  Normal  Mood:  Anxious  Affect:  Congruent and Constricted  Thought Process:  Coherent and Goal Directed  Orientation:  Full (Time, Place, and Person)  Thought Content:  Hallucinations: Auditory Visual  Suicidal Thoughts:  No  Homicidal Thoughts:  No  Memory:  Immediate;   Fair Recent;   Fair Remote;   Fair  Judgement:  Fair  Insight:  Lacking  Psychomotor Activity:  Normal  Concentration:  Concentration: Fair  Recall:  [employer] of Knowledge:  Fair  Language:  Fair  Akathisia:  No  Handed:    AIMS (if indicated):     Assets:  Desire for Improvement Housing Resilience Social Support  ADL's:  Intact  Cognition:  WNL  Sleep:  Number of Hours: 6.5     Treatment Plan Summary: Daily contact with patient to assess and evaluate symptoms and progress in treatment.  - Continue inpatient hospitalization.  - Will continue today 10/07/2017 plan as below except where it is noted.  Depression/insomnia.      - Continue amitriptyline 75 mg po Q hs.      - Continue Wellbutrin XL 150 mg po daily.  EPS.      - Continue Cogentin 0.5 mg po bid prn.  Anxiety.      - Continue Buspar 15 mg po bid.      - Continue Hydroxyzine 50 mg po Q tid.  Mood stabilization.       - Continue Lamictal 50 mg po bid.  Mood control.       - Continue Zyprexa 20 mg po Q hs.  Other medical issues.      - Continue ASA 81 mg po daily for heart health.      - Continue Flexeril 10 mg po tid prn for muscle spasms.      -  Continue Synthroid 50 mcg po daily for hypothyroidism      - Continue NTG 4 mg po Q 5 minutes x 3 prn for cp.      - Continue OxyCodone IR 15 mg po Qid  For pain.      - Continue Protonix 40 mg po Q am for GERD.      - Continue Drisdol 50, 000 units po Q Thursdays  for low CA.       -  Phenergan 25 mg po Q 6 hours prn for nausea.           - Patient to continue to participate in the group sessions.       - Discharge dispositon plan ongoing.  Lindell Spar, NP, PMHNP, FNP-BC. 10/07/2017, 1:55 PMPatient ID: [name], male   DOB: 08-01-1965, 52 y.o.   MRN: [id number]

## 2017-10-07 NOTE — Progress Notes (Signed)
The patient stated that he had a good talk with his daughter today and that he is pleased with the fact that his voices and hallucinations are nearly absent. His goal for tomorrow is to inquire about discharge.

## 2017-10-07 NOTE — Plan of Care (Signed)
  Problem: Activity: Goal: Interest or engagement in activities will improve Outcome: Progressing   Problem: Safety: Goal: Ability to disclose and discuss suicidal ideas will improve Outcome: Progressing   DAR NOTE: Patient presents with calm affect and pleasant mood.  Pt stated he is doing good, the voices are there but not screaming as before. Reports good sleep, good appetite, low energy, and good concentration. Denies SI and visual hallucinations.  Rates depression at 6, hopelessness at 4, and anxiety at 4.  Maintained on routine safety checks.  Medications given as prescribed.  Support and encouragement offered as needed.  Attended group and participated.  States goal for today is "lessen my voices and hallucinations.  Patient observed socializing with peers in the dayroom.  Offered no complaint.

## 2017-10-07 NOTE — [facility] Group Notes (Signed)
LCSW Group Therapy Note  10/07/2017 1:15pm  Type of Therapy/Topic:  Group Therapy:  Balance in Life  Participation Level:  Active  Description of Group:    This group will address the concept of balance and how it feels and looks when one is unbalanced. Patients will be encouraged to process areas in their lives that are out of balance and identify reasons for remaining unbalanced. Facilitators will guide patients in utilizing problem-solving interventions to address and correct the stressor making their life unbalanced. Understanding and applying boundaries will be explored and addressed for obtaining and maintaining a balanced life. Patients will be encouraged to explore ways to assertively make their unbalanced needs known to significant others in their lives, using other group members and facilitator for support and feedback.  Therapeutic Goals: 1. Patient will identify two or more emotions or situations they have that consume much of in their lives. 2. Patient will identify signs/triggers that life has become out of balance:  3. Patient will identify two ways to set boundaries in order to achieve balance in their lives:  4. Patient will demonstrate ability to communicate their needs through discussion and/or role plays  Summary of Patient Progress:      Therapeutic Modalities:   Cognitive Behavioral Therapy Solution-Focused Therapy Assertiveness Training  Trish Mage, LCSW 10/07/2017 3:50 PM

## 2017-10-07 NOTE — Progress Notes (Signed)
  DATA ACTION RESPONSE  Objective- Pt. is visible in the dayroom, seen eating a snack.Presents with an animated/anxious affect and mood. Pt attended wrap-up group. Pleasant on the unit.  Subjective- Denies having any SI/HI/AVH/Pain at this time.Pt. states he hopes to be d/c tomorrow.Is cooperative and remains safe on the unit.  1:1 interaction in private to establish rapport. Encouragement, education, & support given from staff.  PRN flexeril requested and will re-eval accordingly.   Safety maintained with Q 15 checks. Continue with POC.

## 2017-10-08 MED ORDER — LEVOTHYROXINE SODIUM 50 MCG PO TABS: 50.0000 ug | ORAL_TABLET | Freq: Every day | ORAL | Status: DC

## 2017-10-08 MED ORDER — BENZTROPINE MESYLATE 0.5 MG PO TABS: 0.5000 mg | ORAL_TABLET | Freq: Two times a day (BID) | ORAL | 0 refills | Status: DC | PRN

## 2017-10-08 MED ORDER — CYCLOBENZAPRINE HCL 10 MG PO TABS: 10.0000 mg | ORAL_TABLET | Freq: Three times a day (TID) | ORAL | 0 refills | Status: DC | PRN

## 2017-10-08 MED ORDER — BUPROPION HCL ER (XL) 150 MG PO TB24: 150.0000 mg | ORAL_TABLET | Freq: Every day | ORAL | 0 refills | Status: DC

## 2017-10-08 MED ORDER — NITROGLYCERIN 0.4 MG SL SUBL: 0.4000 mg | SUBLINGUAL_TABLET | SUBLINGUAL | 12 refills | Status: DC | PRN

## 2017-10-08 MED ORDER — AMITRIPTYLINE HCL 25 MG PO TABS: 75.0000 mg | ORAL_TABLET | Freq: Every day | ORAL | 0 refills | Status: DC

## 2017-10-08 MED ORDER — OXYCODONE HCL 15 MG PO TABS: 15.0000 mg | ORAL_TABLET | Freq: Four times a day (QID) | ORAL | 0 refills | Status: DC

## 2017-10-08 MED ORDER — LAMOTRIGINE 100 MG PO TABS: 50.0000 mg | ORAL_TABLET | Freq: Two times a day (BID) | ORAL | 0 refills | Status: DC

## 2017-10-08 MED ORDER — OLANZAPINE 20 MG PO TABS: 20.0000 mg | ORAL_TABLET | Freq: Every day | ORAL | 0 refills | Status: DC

## 2017-10-08 MED ORDER — ASPIRIN 81 MG PO TBEC: 81.0000 mg | DELAYED_RELEASE_TABLET | Freq: Every day | ORAL | Status: DC

## 2017-10-08 MED ORDER — BUSPIRONE HCL 15 MG PO TABS: 15.0000 mg | ORAL_TABLET | Freq: Two times a day (BID) | ORAL | 0 refills | Status: DC

## 2017-10-08 MED ORDER — PANTOPRAZOLE SODIUM 40 MG PO TBEC: 40.0000 mg | DELAYED_RELEASE_TABLET | Freq: Every day | ORAL | Status: DC

## 2017-10-08 MED ORDER — HYDROXYZINE HCL 25 MG PO TABS: 25.0000 mg | ORAL_TABLET | Freq: Three times a day (TID) | ORAL | 0 refills | Status: DC | PRN

## 2017-10-08 MED ORDER — NICOTINE 14 MG/24HR TD PT24: 14.0000 mg | MEDICATED_PATCH | Freq: Every day | TRANSDERMAL | 0 refills | Status: DC

## 2017-10-08 MED ORDER — VITAMIN D (ERGOCALCIFEROL) 1.25 MG (50000 UNIT) PO CAPS: 50000.0000 [IU] | ORAL_CAPSULE | ORAL | 0 refills | Status: DC

## 2017-10-08 NOTE — Discharge Summary (Addendum)
Physician Discharge Summary Note  Patient:  [name] is an 52 y.o., male  MRN:  [id number]  DOB:  12-Sep-1965  Patient phone:  [phone] (home)   Patient address:   [id number] [place] [address],   Total Time spent with patient: Greater than 30 minutes  Date of Admission:  10/04/2017  Date of Discharge: 10/08/17  Reason for Admission: Worsening anxiety, VH, and CAH of multiple people telling him to kill himself.   Principal Problem: Bipolar I disorder, most recent episode depressed, severe with psychotic features [facility])  Discharge Diagnoses: Patient Active Problem List   Diagnosis Date Noted  . Bipolar I disorder, most recent episode depressed, severe with psychotic features ([place]) [F31.5] 10/04/2017  . Spinal stenosis of cervical region [M48.02] 09/21/2017  . Chronic pain syndrome [G89.4] 10/28/2016  . Cannabis use disorder, mild, abuse [F12.10] 10/28/2016  . Rhabdomyolysis [M62.82] 08/23/2016  . AKI (acute kidney injury) ([place]) [N17.9] 08/23/2016  . Transaminitis [R74.0] 08/23/2016  . Drug overdose, multiple drugs [T50.901A] 08/23/2016  . Tachycardia [R00.0] 08/23/2016  . Accelerated hypertension [I10] 08/23/2016  . Bipolar disorder, curr episode mixed, severe, with psychotic features ([place]) [F31.64] 12/26/2015  . Cocaine use disorder, mild, abuse ([place]) [F14.10] 12/26/2015  . History of rhabdomyolysis [Z87.39] 12/26/2015  . Liver injury [S36.119A] 12/26/2015  . Hx of renal failure [Z87.448] 12/26/2015  . Angina, class III ([place]) [I20.9] 08/18/2015  . Dyslipidemia, goal LDL below 70 [E78.5] 08/18/2015  . Family history of premature coronary artery disease [Z82.49] 08/18/2015  . Tobacco use disorder, continuous [F17.209] 08/18/2015  . Tibial plateau fracture [S82.143A] 10/02/2013  . Multiple falls [R29.6] 10/02/2013  . Alteration in self-care ability [R53.81] 10/02/2013  . 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 Fracture of tibia with  fibula, left, closed [S82.202A, S82.402A] 10/02/2013  . Chronic back pain [M54.9, G89.29] 10/02/2013  . 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 History of suicidal ideation [Z86.59] 10/02/2013  . Postural imbalance with levoscoliosis, h/o [R29.3] 10/02/2013  . Degenerative disc disease, lumbar [M51.36] 09/07/2013  . Chest pain [R07.9] 12/25/2012  . Leg pain [M79.606] 02/24/2011  . Neck pain [M54.2] 02/24/2011  . Right hip pain [M25.551] 02/24/2011  . IRRITABLE BOWEL SYNDROME [K58.9] 08/09/2008  . HYPERLIPIDEMIA [E78.5] 10/28/2006  . DISORDER, BIPOLAR NOS [F31.9] 10/28/2006  . ANXIETY [F41.1] 10/28/2006  . DEPRESSION [F32.9] 10/28/2006  . PAIN, CHRONIC NEC [G89.29] 10/28/2006  . GERD [K21.9] 10/28/2006   Past Psychiatric History: Bipolar disorder  Past Medical History:  Past Medical History:  Diagnosis Date  . Accelerated hypertension 08/23/2016   pt denies this  . Anxiety   . Arthritis   . Bipolar affective disorder ([place])   . Chronic back pain   . Chronic kidney disease 2017   AKF - due to Rhabdomyolysis  . DDD (degenerative disc disease)   . Depression   . DJD (degenerative joint disease)   . Elevated liver function tests   . Fibromyalgia   . GERD (gastroesophageal reflux disease)   . Head injury   . Head injury, closed, with concussion    x 3  from falls  . Hyperlipidemia   . Hypothyroidism   . IBS (irritable bowel syndrome)    with diarrhea  . Inguinal hernia    left  . Insomnia   . Myofascial pain syndrome   . Osteopenia   . Pneumonia   . Shortness of breath dyspnea    with exertion  . Tibial plateau fracture, left   .  Tremors of nervous system     Past Surgical History:  Procedure Laterality Date  . ANTERIOR CERVICAL DECOMP/DISCECTOMY FUSION N/A 09/21/2017   Procedure: ANTERIOR CERVICAL DECOMPRESSION/DISCECTOMY FUSION - CERVICAL THREE-CERVICAL FOUR;  Surgeon: Kary Kos, MD;  Location: [place];  Service: Neurosurgery;  Laterality: N/A;  .  CARDIAC CATHETERIZATION N/A 08/31/2015   Procedure: Left Heart Cath and Coronary Angiography;  Surgeon: Leonie Man, MD;  Location: [place] [facility];  Service: Cardiovascular;  Laterality: N/A;  . Carotid Dopplers Bilateral 02/20/2015   [facility]: Mild, less than 39% left and right internal carotid artery stenosis. No significant plaque burden  . CERVICAL DISC SURGERY     C5-7  . COLONOSCOPY    . COLONOSCOPY    . ESOPHAGOGASTRODUODENOSCOPY    . FASCIOTOMY Left 10/20/2013   Procedure: LEFT leg ANTERIOR COMPARTMENT FACSCIOTOMY;  Surgeon: Rozanna Box, MD;  Location: [place];  Service: Orthopedics;  Laterality: Left;  . INGUINAL HERNIA REPAIR Left   . Nuclear Stress Test  03/06/2015   [facility]: Normal EKG. Low normal EF (49%) normal regional wall motion. No evidence of ischemia or infarction.  . ORIF TIBIA PLATEAU Left 10/20/2013   Procedure: OPEN REDUCTION INTERNAL FIXATION (ORIF) LEFT TIBIAL PLATEAU;  Surgeon: Rozanna Box, MD;  Location: [place];  Service: Orthopedics;  Laterality: Left;  . SEPTOPLASTY    . SPINAL CORD STIMULATOR BATTERY EXCHANGE N/A 02/08/2016   Procedure: Lumbar spinal cord stimulator implantable pulse generator replacement;  Surgeon: Clydell Hakim, MD;  Location: [place] [facility];  Service: Neurosurgery;  Laterality: N/A;  . SPINAL CORD STIMULATOR IMPLANT    . TRANSTHORACIC ECHOCARDIOGRAM  02/20/2015   [facility]: Mild concentric LVH. EF 55-60%. Normal regional wall motion. Mild to moderate TR with no significant pulmonary hypertension.   Family History:  Family History  Problem Relation Age of Onset  . Heart disease Mother   . Hypertension Mother   . Sudden death Mother        Presumably cardiac  . Hyperlipidemia Father   . Heart attack Father 81       At least 5 MIs. Had CABG.  . Heart failure Father   . Diabetes Father   . Hypertension Father   . Kidney disease Father   . Diabetes Brother   . Lung cancer Maternal  Grandmother   . Diabetes Paternal Grandmother   . Sudden death Paternal Grandmother        Unclear etiology  . Diabetes Unknown   . Kidney disease Unknown   . Kidney disease Brother   . Hypertension Brother   . Sudden death Brother        Thought to be related to heart disease  . Throat cancer Brother   . Colon cancer Neg Hx   . Mental illness Neg Hx    Family Psychiatric  History: See H&P  Social History:  Social History   Substance and Sexual Activity  Alcohol Use No  . Alcohol/week: 0.0 oz   Comment: none since 1999- heavy drinker in past     Social History   Substance and Sexual Activity  Drug Use Yes  . Types: Oxycodone, Cocaine   Comment: rarely- last 2015-    Social History   Socioeconomic History  . Marital status: Married    Spouse name: Not on file  . Number of children: 1  . Years of education: Not on file  . Highest education level: Not on file  Occupational  History  . Occupation: disabled  Social Needs  . Financial resource strain: Not on file  . Food insecurity:    Worry: Not on file    Inability: Not on file  . Transportation needs:    Medical: Not on file    Non-medical: Not on file  Tobacco Use  . Smoking status: Former Smoker    Packs/day: 1.00    Years: 35.00    Pack years: 35.00    Types: Cigarettes    Last attempt to quit: 08/14/2017    Years since quitting: 0.1  . Smokeless tobacco: Never Used  . Tobacco comment: 2018  Substance and Sexual Activity  . Alcohol use: No    Alcohol/week: 0.0 oz    Comment: none since 1999- heavy drinker in past  . Drug use: Yes    Types: Oxycodone, Cocaine    Comment: rarely- last 2015-  . Sexual activity: Not on file  Lifestyle  . Physical activity:    Days per week: Not on file    Minutes per session: Not on file  . Stress: Not on file  Relationships  . Social connections:    Talks on phone: Not on file    Gets together: Not on file    Attends religious service: Not on file    Active  member of club or organization: Not on file    Attends meetings of clubs or organizations: Not on file    Relationship status: Not on file  Other Topics Concern  . Not on file  Social History Narrative   He is a separated father of 1. His daughter who is in nursing school lives with him part-time.   He is currently disabled due to chronic back pain.    He did some college education.   He currently is trying to use electronic cigarettes but has smoked one pack a day for over 35 years.   He has recently stopped using prescription benzodiazepine and pain medications because he is in the process of changing pain med doctors.   He does not routinely exercise secondary to back, neck pain as well as dyspnea.   Hospital Course: (Per Md's discharge SRA): Lyan Moyano is a 52 y/o M with history of Bipolar I who was admitted voluntarily from [place] where he presented with worsening anxiety, VH, and CAH of multiple people telling him to kill himself. Pt had recent cervical spine surgery on 09/21/17 and reported gradual onset of symptoms after that incident. He was medically cleared and transferred to [facility] for additional treatment and stabilization. He was restarted on previous home medications of amitriptyline, belsomra, buspar, lamictal, zyprexa, and vistaril. His dose of zyprexa was increased to address symptoms of psychosis. Pt had improvement of his presenting symptoms, and he tolerating his medications without difficulty/side effects.  Besides the use of Amitriptyline 75 mg for depression/insomnia, Buspar 15 mg for anxiety, Lamictal 100 mg for mood stabilization, Zyprexa 20 mg for mood control & Hydroxyzine 25 mg prn for anxiety, Marks was also medicated & discharged on; Cogentin 0.5 mg for EPS, Wellbutrin XL 150 mg for depression & Nicotine patch 14 mg for smoking cessation. He was enrolled & participated in the group counseling sessions being offered & held on this unit. He learned coping skills. He was  resumed & discharged on all his pertinent home medications for his other pre-existing medical conditions presented. He tolerated his treatment regimen without any adverse effects or reactions reported.   Today upon his discharge evaluation,  pt shares, "I'm doing very well. The voices are almost completely gone and the people I'm seeing are cut down by about 75%." Pt reports that his sleep is mildly interrupted but overall he feels well rested. He denies other physical complaints. He denies SI/HI. He feels that his medications have been helpful and he is in agreement to continue his current treatment regimen without changes. He is future oriented about going to his daughter's graduation later this week. He plans to follow up with his current outpatient provider, Dr. Darleene Cleaver. He was able to engage in safety planning including plan to return to [facility] or contact emergency services if he feels unable to maintain his own safety or the safety of others. Pt had no further questions, comments  Upon discharge, Mando presents medically & mentally stable. He will continue mental health care on an outpatient basis as noted below. He was provided with all the necessary information needed to make this appointment without problems. He left [facility] with all belongings in no apparent distress. Transportation per wife.  Physical Findings: AIMS:  , ,  ,  ,    CIWA:    COWS:     Musculoskeletal: Strength & Muscle Tone: within normal limits Gait & Station: normal Patient leans: N/A  Psychiatric Specialty Exam: Physical Exam  Nursing note and vitals reviewed. Constitutional: He is oriented to person, place, and time. He appears well-developed.  HENT:  Head: Normocephalic.  Eyes: Pupils are equal, round, and reactive to light.  Neck: Normal range of motion.  Cardiovascular: Normal rate.  Respiratory: Effort normal.  GI: Soft.  Genitourinary:  Genitourinary Comments: Deferred  Musculoskeletal: Normal range of  motion.  Hx. Tibia fx.  Neurological: He is alert and oriented to person, place, and time.  Skin: Skin is warm and dry.    Review of Systems  Constitutional: Negative.   HENT: Negative.   Eyes: Negative.   Respiratory: Negative.   Cardiovascular: Negative.   Gastrointestinal: Negative.   Genitourinary: Negative.   Musculoskeletal: Negative.   Skin: Negative.   Neurological: Negative.   Endo/Heme/Allergies: Negative.   Psychiatric/Behavioral: Positive for depression (Stable ), hallucinations (Hx. Psychosis, hallucinations) and substance abuse (Hx. Benzodiazepine, Opioid & THC use.  ). Negative for memory loss and suicidal ideas. The patient has insomnia (Stable ). The patient is not nervous/anxious.     Blood pressure 100/71, pulse 96, temperature 97.8 F (36.6 C), temperature source Oral, resp. rate 18, height 6' (1.829 m), weight 97.1 kg (214 lb).Body mass index is 29.02 kg/m.  See Md's suicide risk assessment.   Have you used any form of tobacco in the last 30 days? (Cigarettes, Smokeless Tobacco, Cigars, and/or Pipes): No  Has this patient used any form of tobacco in the last 30 days? (Cigarettes, Smokeless Tobacco, Cigars, and/or Pipes) Yes: provided with a nicotine patch 14 mg prescription upon discharge for smoking cessation.  Blood Alcohol level:  Lab Results  Component Value Date   ETH <10 10/03/2017   ETH <5 67/67/2094   Metabolic Disorder Labs:  Lab Results  Component Value Date   HGBA1C 4.6 (L) 10/29/2016   MPG 85 10/29/2016   MPG 103 12/26/2015   Lab Results  Component Value Date   PROLACTIN 27.7 (H) 10/29/2016   Lab Results  Component Value Date   CHOL 245 (H) 10/29/2016   TRIG 195 (H) 10/29/2016   HDL 47 10/29/2016   CHOLHDL 5.2 10/29/2016   VLDL 39 10/29/2016   LDLCALC 159 (H) 10/29/2016  LDLCALC 176 (H) 12/27/2015   See Psychiatric Specialty Exam and Suicide Risk Assessment completed by Attending Physician prior to discharge.  Discharge  destination:  Home  Is patient on multiple antipsychotic therapies at discharge:  No   Has Patient had three or more failed trials of antipsychotic monotherapy by history:  No  Recommended Plan for Multiple Antipsychotic Therapies: NA  Allergies as of 10/08/2017      Reactions   Zolpidem Tartrate Other (See Comments)   Hallucinations and sleep walks   Lyrica [pregabalin] Other (See Comments)   Causes extreme depression   Demerol [meperidine] Itching, Rash, Hives      Medication List    STOP taking these medications   BELSOMRA 10 MG Tabs Generic drug:  Suvorexant   esomeprazole 40 MG capsule Commonly known as:  Severance Replaced by:  pantoprazole 40 MG tablet   FISH OIL PO   nicotine polacrilex 4 MG lozenge Commonly known as:  COMMIT   promethazine 25 MG tablet Commonly known as:  PHENERGAN     TAKE these medications     Indication  amitriptyline 25 MG tablet Commonly known as:  ELAVIL Take 3 tablets (75 mg total) by mouth at bedtime. For insomnia/depression What changed:    additional instructions  Another medication with the same name was removed. Continue taking this medication, and follow the directions you see here.  Indication:  Depression/insomnia   aspirin 81 MG EC tablet Take 1 tablet (81 mg total) by mouth daily. For heart health Start taking on:  10/09/2017 What changed:  additional instructions  Indication:  Cerebrovascular Accident or Stroke, Heart health   benztropine 0.5 MG tablet Commonly known as:  COGENTIN Take 1 tablet (0.5 mg total) by mouth 2 (two) times daily as needed (EPS).  Indication:  Extrapyramidal Reaction caused by Medications   buPROPion 150 MG 24 hr tablet Commonly known as:  WELLBUTRIN XL Take 1 tablet (150 mg total) by mouth daily. For depression What changed:  additional instructions  Indication:  Major Depressive Disorder   busPIRone 15 MG tablet Commonly known as:  BUSPAR Take 1 tablet (15 mg total) by mouth 2 (two)  times daily. For anxiety  Indication:  Generalized Anxiety Disorder   cyclobenzaprine 10 MG tablet Commonly known as:  FLEXERIL Take 1 tablet (10 mg total) by mouth 3 (three) times daily as needed for muscle spasms.  Indication:  Muscle Spasm   hydrOXYzine 25 MG tablet Commonly known as:  ATARAX/VISTARIL Take 1 tablet (25 mg total) by mouth every 8 (eight) hours as needed for anxiety. For anxiety What changed:  Another medication with the same name was removed. Continue taking this medication, and follow the directions you see here.  Indication:  Feeling Anxious   lamoTRIgine 100 MG tablet Commonly known as:  LAMICTAL Take 0.5 tablets (50 mg total) by mouth 2 (two) times daily. For mood stabilization What changed:    additional instructions  Another medication with the same name was removed. Continue taking this medication, and follow the directions you see here.  Indication:  Mood stabilization   levothyroxine 50 MCG tablet Commonly known as:  SYNTHROID, LEVOTHROID Take 1 tablet (50 mcg total) by mouth daily before breakfast. For hypothyroidism What changed:  additional instructions  Indication:  Underactive Thyroid   nicotine 14 mg/24hr patch Commonly known as:  NICODERM CQ - dosed in mg/24 hours Place 1 patch (14 mg total) onto the skin daily. (may purchase from over the counter): For smoking cessation Start  taking on:  10/09/2017 What changed:  additional instructions  Indication:  Nicotine Addiction   nitroGLYCERIN 0.4 MG SL tablet Commonly known as:  NITROSTAT Place 1 tablet (0.4 mg total) under the tongue every 5 (five) minutes as needed for chest pain.  Indication:  Acute Angina Pectoris   OLANZapine 20 MG tablet Commonly known as:  ZYPREXA Take 1 tablet (20 mg total) by mouth at bedtime. For mood control What changed:    medication strength  how much to take  additional instructions  Indication:  Mood control   oxyCODONE 15 MG immediate release  tablet Commonly known as:  ROXICODONE Take 1 tablet (15 mg total) by mouth 4 (four) times daily. For chronic pain What changed:    when to take this  additional instructions  Indication:  Chronic Pain   pantoprazole 40 MG tablet Commonly known as:  PROTONIX Take 1 tablet (40 mg total) by mouth daily at 12 noon. For acid reflux Start taking on:  10/09/2017 Replaces:  esomeprazole 40 MG capsule  Indication:  Gastroesophageal Reflux Disease   Vitamin D (Ergocalciferol) 50000 units Caps capsule Commonly known as:  DRISDOL Take 1 capsule (50,000 Units total) by mouth every Thursday. For calcium supplementation Start taking on:  10/15/2017 What changed:  additional instructions  Indication:  Osteoporosis, Vitamin D Deficiency      [place] Follow up on 10/09/2017.   Why:  Friday at 11:45 with Dr A for your hospital follow up appointment. Contact information: [place] [address] [phone]          Follow-up recommendations: Activities as tolerated. Diet: As recommended by your primary care provider. Keep all scheduled follow-up appointment as recommended.   Comments: Patient is instructed prior to discharge to: Take all medications as prescribed by his/her mental healthcare provider. Report any adverse effects and or reactions from the medicines to his/her outpatient provider promptly. Patient has been instructed & cautioned: To not engage in alcohol and or illegal drug use while on prescription medicines. In the event of worsening symptoms, patient is instructed to call the crisis hotline, 911 and or go to the nearest ED for appropriate evaluation and treatment of symptoms. To follow-up with his/her primary care provider for your other medical issues, concerns and or health care needs.   Signed: Lindell Spar, NP, pmhnp, FNP-BC 10/08/2017, 12:37 PM   Patient seen, Suicide Assessment Completed.   Disposition Plan Reviewed

## 2017-10-08 NOTE — Progress Notes (Signed)
  [facility] Adult Case Management Discharge Plan :  Will you be returning to the same living situation after discharge:  Yes,  home At discharge, do you have transportation home?: Yes,  wife Do you have the ability to pay for your medications: Yes,  insurance  Release of information consent forms completed and in the chart;  Patient's signature needed at discharge.  Patient to Follow up at: Follow-up [place] Follow up on 10/09/2017.   Why:  Friday at 11:45 with Dr A for your hospital follow up appointment. Contact information: [place] [address] [phone]           Next level of care provider has access to [place] and Suicide Prevention discussed: Yes,  yes  Have you used any form of tobacco in the last 30 days? (Cigarettes, Smokeless Tobacco, Cigars, and/or Pipes): No  Has patient been referred to the Quitline?: N/A patient is not a smoker  Patient has been referred for addiction treatment: Pt. refused referral  Trish Mage, LCSW 10/08/2017, 8:57 AM

## 2017-10-08 NOTE — Plan of Care (Signed)
Pt was able to identify exercises and skills he could use to cope with different situations at completion of recreation therapy sessions.   Victorino Sparrow, LRT/CTRS

## 2017-10-08 NOTE — Progress Notes (Signed)
Recreation Therapy Notes  INPATIENT RECREATION TR PLAN  Patient Details Name: TENNYSON WACHA MRN: [id number] DOB: 02-May-1966 Today's Date: 10/08/2017  Rec Therapy Plan Is patient appropriate for Therapeutic Recreation?: Yes Treatment times per week: about 3 days Estimated Length of Stay: 5-7 days TR Treatment/Interventions: Group participation (Comment)  Discharge Criteria Pt will be discharged from therapy if:: Discharged Treatment plan/goals/alternatives discussed and agreed upon by:: Patient/family  Discharge Summary Short term goals set: See care plan Short term goals met: Adequate for discharge Which groups?: Wellness, Self-esteem, Other (Comment)(Team building) Reason goals not met: None Therapeutic equipment acquired: N/A Reason patient discharged from therapy: Discharge from hospital Pt/family agrees with progress & goals achieved: Yes Date patient discharged from therapy: 10/08/17    Victorino Sparrow, LRT/CTRS  Ria Comment, [place] 10/08/2017, 12:56 PM

## 2017-10-08 NOTE — Progress Notes (Signed)
Pt received both written and verbal discharge instructions. Pt verbalized understanding of discharge instructions. Pt agreed to f/u appt and med regimen. Pt received d/c packet and prescriptions. Pt gathered belongings from room and locker. Pt safely discharged to the lobby. 

## 2017-10-08 NOTE — Progress Notes (Signed)
Recreation Therapy Notes  Date: 5.9.19 Time: 1000 Location: 500 Hall Dayroom  Group Topic: Communication, Team Building, Problem Solving  Goal Area(s) Addresses:  Patient will effectively work with peer towards shared goal.  Patient will identify skill used to make activity successful.  Patient will identify how skills used during activity can be used to reach post d/c goals.   Behavioral Response: Engaged  Intervention: STEM Activity   Activity: Straw Bridge. In groups of 2, groups were to build a standing bridge that could hold the weight of a 60 piece puzzle box.  Each group was given 20 straws and a long piece of masking tape.  Education: Education officer, community, Dentist.   Education Outcome: Acknowledges education/In group clarification offered/Needs additional education.   Clinical Observations/Feedback: Pt stated they had to use engineering and planning to complete the activity.  Pt also expressed "without planning there is chaos".  Pt stated that with your support system, you should also "have a back up plan".    Victorino Sparrow, LRT/CTRS      Victorino Sparrow A 10/08/2017 12:13 PM

## 2017-10-08 NOTE — BHH Suicide Risk Assessment (Signed)
[facility], [place] Discharge Suicide Risk Assessment   Principal Problem: Bipolar I disorder, most recent episode depressed, severe with psychotic features [facility]) Discharge Diagnoses:  Patient Active Problem List   Diagnosis Date Noted  . Bipolar I disorder, most recent episode depressed, severe with psychotic features ([place]) [F31.5] 10/04/2017  . Spinal stenosis of cervical region [M48.02] 09/21/2017  . Chronic pain syndrome [G89.4] 10/28/2016  . Cannabis use disorder, mild, abuse [F12.10] 10/28/2016  . Rhabdomyolysis [M62.82] 08/23/2016  . AKI (acute kidney injury) ([place]) [N17.9] 08/23/2016  . Transaminitis [R74.0] 08/23/2016  . Drug overdose, multiple drugs [T50.901A] 08/23/2016  . Tachycardia [R00.0] 08/23/2016  . Accelerated hypertension [I10] 08/23/2016  . Bipolar disorder, curr episode mixed, severe, with psychotic features ([place]) [F31.64] 12/26/2015  . Cocaine use disorder, mild, abuse ([place]) [F14.10] 12/26/2015  . History of rhabdomyolysis [Z87.39] 12/26/2015  . Liver injury [S36.119A] 12/26/2015  . Hx of renal failure [Z87.448] 12/26/2015  . Angina, class III ([place]) [I20.9] 08/18/2015  . Dyslipidemia, goal LDL below 70 [E78.5] 08/18/2015  . Family history of premature coronary artery disease [Z82.49] 08/18/2015  . Tobacco use disorder, continuous [F17.209] 08/18/2015  . Tibial plateau fracture [S82.143A] 10/02/2013  . Multiple falls [R29.6] 10/02/2013  . Alteration in self-care ability [R53.81] 10/02/2013  . 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 Fracture of tibia with fibula, left, closed [S82.202A, S82.402A] 10/02/2013  . Chronic back pain [M54.9, G89.29] 10/02/2013  . 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 History of suicidal ideation [Z86.59] 10/02/2013  . Postural imbalance with levoscoliosis, h/o [R29.3] 10/02/2013  . Degenerative disc disease, lumbar [M51.36] 09/07/2013  . Chest pain [R07.9] 12/25/2012  . Leg pain [M79.606] 02/24/2011  .  Neck pain [M54.2] 02/24/2011  . Right hip pain [M25.551] 02/24/2011  . IRRITABLE BOWEL SYNDROME [K58.9] 08/09/2008  . HYPERLIPIDEMIA [E78.5] 10/28/2006  . DISORDER, BIPOLAR NOS [F31.9] 10/28/2006  . ANXIETY [F41.1] 10/28/2006  . DEPRESSION [F32.9] 10/28/2006  . PAIN, CHRONIC NEC [G89.29] 10/28/2006  . GERD [K21.9] 10/28/2006    Total Time spent with patient: 30 minutes  Musculoskeletal: Strength & Muscle Tone: within normal limits Gait & Station: normal Patient leans: N/A  Psychiatric Specialty Exam: Review of Systems  Constitutional: Negative for chills and fever.  Respiratory: Negative for cough and shortness of breath.   Cardiovascular: Negative for chest pain.  Gastrointestinal: Negative for abdominal pain, heartburn, nausea and vomiting.  Psychiatric/Behavioral: Positive for hallucinations. Negative for depression and suicidal ideas. The patient has insomnia. The patient is not nervous/anxious.     Blood pressure 100/71, pulse 96, temperature 97.8 F (36.6 C), temperature source Oral, resp. rate 18, height 6' (1.829 m), weight 97.1 kg (214 lb).Body mass index is 29.02 kg/m.  General Appearance: Casual and Fairly Groomed  Engineer, water::  Good  Speech:  Clear and Coherent and Normal Rate  Volume:  Normal  Mood:  Euthymic  Affect:  Appropriate, Congruent and Full Range  Thought Process:  Coherent and Goal Directed  Orientation:  Full (Time, Place, and Person)  Thought Content:  Hallucinations: Auditory Visual  Suicidal Thoughts:  No  Homicidal Thoughts:  No  Memory:  Immediate;   Fair Recent;   Fair Remote;   Fair  Judgement:  Fair  Insight:  Fair  Psychomotor Activity:  Normal  Concentration:  Fair  Recall:  [employer] of Knowledge:Fair  Language: Fair  Akathisia:  No  Handed:    AIMS (if indicated):     Assets:  Communication Skills Resilience Social Support  Sleep:  Number of Hours: 4.5  Cognition: WNL  ADL's:  Intact   Mental Status Per Nursing  Assessment::   On Admission:  NA  Demographic Factors:  Male, Caucasian, Low socioeconomic status and Unemployed  Loss Factors: Decline in physical health and Financial problems/change in socioeconomic status  Historical Factors: NA  Risk Reduction Factors:   Sense of responsibility to family, Positive social support, Positive therapeutic relationship and Positive coping skills or problem solving skills  Continued Clinical Symptoms:  Bipolar Disorder:   Depressive phase Currently Psychotic Previous Psychiatric Diagnoses and Treatments Medical Diagnoses and Treatments/Surgeries  Cognitive Features That Contribute To Risk:  None    Suicide Risk:  Minimal: No identifiable suicidal ideation.  Patients presenting with no risk factors but with morbid ruminations; may be classified as minimal risk based on the severity of the depressive symptoms  [place] Follow up on 10/09/2017.   Why:  Friday at 11:45 with Dr A for your hospital follow up appointment. Contact information: [address] [place] [address] [phone]         Subjective Data:   [name] is a 52 y/o M with history of Bipolar I who was admitted voluntarily from [facility] where he presented with worsening anxiety, VH, and CAH of multiple people telling him to kill himself. Pt had recent cervical spine surgery on 09/21/17 and reported gradual onset of symptoms after that incident. He was medically cleared and transferred to [facility] for additional treatment and stabilization. He was restarted on previous home medications of amitriptyline, belsomra, buspar, lamictal, zyprexa, and vistaril. His dose of zyprexa was increased to address symptoms of psychosis. Pt had improvement of his presenting symptoms, and he tolerating his medications without difficulty/side effects.  Today upon evaluation, pt shares, "I'm doing very well. The voices are almost completely gone and the  people I'm seeing are cut down by about 75%." Pt reports that his sleep is mildly interrupted but overall he feels well rested. He denies other physical complaints. He denies SI/HI. He feels that his medications have been helpful and he is in agreement to continue his current treatment regimen without changes. He is future oriented about going to his daughter's graduation later this week. He plans to follow up with his current outpatient provider, Dr. Darleene Cleaver. He was able to engage in safety planning including plan to return to [facility] or contact emergency services if he feels unable to maintain his own safety or the safety of others. Pt had no further questions, comments, or concerns.   Plan Of Care/Follow-up recommendations:   - Discharge to outpatient level of care  -Bipolar I disorder, current episode depressed, with psychotic features      - Continue amitriptyline 75 mg po Q hs.      - Continue Wellbutrin XL 150 mg po daily.      - Continue Lamictal 50 mg po bid.      - Continue Zyprexa 20 mg po Q hs.  EPS.      - Continue Cogentin 0.5 mg po bid prn.  Anxiety.      - Continue Buspar 15 mg po bid.      - Continue Hydroxyzine 50 mg po Q tid.  Other medical issues.      - Continue ASA 81 mg po daily for heart health.      - Continue Flexeril 10 mg po tid prn for muscle spasms.      -  Continue Synthroid 50 mcg po daily for hypothyroidism      - Continue nitrogylcerin 4 mg po Q 5 minutes x 3 prn for chest pain      - Continue OxyCodone IR 15 mg po Qid  For pain.      - Continue Protonix 40 mg po Q am for GERD.      - Continue Drisdol 50, 000 units po Q Thursdays for low CA.       - Phenergan 25 mg po Q 6 hours prn for nausea.     Activity:  as tolerated Diet:  normal Tests:  NA Other:  see above for DC plan  Pennelope Bracken, MD 10/08/2017, 10:33 AM

## 2017-10-23 DIAGNOSIS — G894 Chronic pain syndrome: Secondary | ICD-10-CM | POA: Diagnosis not present

## 2017-10-23 DIAGNOSIS — M5412 Radiculopathy, cervical region: Secondary | ICD-10-CM | POA: Diagnosis not present

## 2017-10-23 DIAGNOSIS — F1120 Opioid dependence, uncomplicated: Secondary | ICD-10-CM | POA: Diagnosis not present

## 2017-10-23 DIAGNOSIS — M5136 Other intervertebral disc degeneration, lumbar region: Secondary | ICD-10-CM | POA: Diagnosis not present

## 2017-10-23 DIAGNOSIS — M47816 Spondylosis without myelopathy or radiculopathy, lumbar region: Secondary | ICD-10-CM | POA: Diagnosis not present

## 2017-10-23 DIAGNOSIS — F419 Anxiety disorder, unspecified: Secondary | ICD-10-CM | POA: Diagnosis not present

## 2017-10-23 DIAGNOSIS — Q761 Klippel-Feil syndrome: Secondary | ICD-10-CM | POA: Diagnosis not present

## 2017-10-23 DIAGNOSIS — Z79891 Long term (current) use of opiate analgesic: Secondary | ICD-10-CM | POA: Diagnosis not present

## 2017-10-30 DIAGNOSIS — F3132 Bipolar disorder, current episode depressed, moderate: Secondary | ICD-10-CM | POA: Diagnosis not present

## 2017-10-30 DIAGNOSIS — F411 Generalized anxiety disorder: Secondary | ICD-10-CM | POA: Diagnosis not present

## 2017-11-05 DIAGNOSIS — M542 Cervicalgia: Secondary | ICD-10-CM | POA: Diagnosis not present

## 2017-11-06 DIAGNOSIS — R0789 Other chest pain: Secondary | ICD-10-CM | POA: Diagnosis not present

## 2017-11-06 DIAGNOSIS — R079 Chest pain, unspecified: Secondary | ICD-10-CM | POA: Diagnosis not present

## 2017-11-06 DIAGNOSIS — F17210 Nicotine dependence, cigarettes, uncomplicated: Secondary | ICD-10-CM | POA: Diagnosis not present

## 2017-11-06 DIAGNOSIS — J449 Chronic obstructive pulmonary disease, unspecified: Secondary | ICD-10-CM | POA: Diagnosis not present

## 2017-11-11 ENCOUNTER — Other Ambulatory Visit: Payer: Self-pay | Admitting: Neurosurgery

## 2017-11-11 DIAGNOSIS — M542 Cervicalgia: Secondary | ICD-10-CM

## 2017-11-12 ENCOUNTER — Ambulatory Visit
Admission: RE | Admit: 2017-11-12 | Discharge: 2017-11-12 | Disposition: A | Discharge status: Home / Self Care | Payer: 59 | Source: Ambulatory Visit | Attending: Neurosurgery | Admitting: Neurosurgery

## 2017-11-12 DIAGNOSIS — M542 Cervicalgia: Secondary | ICD-10-CM

## 2017-11-12 DIAGNOSIS — M4322 Fusion of spine, cervical region: Secondary | ICD-10-CM | POA: Diagnosis not present

## 2017-11-23 DIAGNOSIS — F1120 Opioid dependence, uncomplicated: Secondary | ICD-10-CM | POA: Diagnosis not present

## 2017-11-23 DIAGNOSIS — M47816 Spondylosis without myelopathy or radiculopathy, lumbar region: Secondary | ICD-10-CM | POA: Diagnosis not present

## 2017-11-23 DIAGNOSIS — M5412 Radiculopathy, cervical region: Secondary | ICD-10-CM | POA: Diagnosis not present

## 2017-11-23 DIAGNOSIS — Q761 Klippel-Feil syndrome: Secondary | ICD-10-CM | POA: Diagnosis not present

## 2017-11-23 DIAGNOSIS — F419 Anxiety disorder, unspecified: Secondary | ICD-10-CM | POA: Diagnosis not present

## 2017-11-23 DIAGNOSIS — M5136 Other intervertebral disc degeneration, lumbar region: Secondary | ICD-10-CM | POA: Diagnosis not present

## 2017-11-23 DIAGNOSIS — Z79891 Long term (current) use of opiate analgesic: Secondary | ICD-10-CM | POA: Diagnosis not present

## 2017-11-23 DIAGNOSIS — G894 Chronic pain syndrome: Secondary | ICD-10-CM | POA: Diagnosis not present

## 2017-11-24 DIAGNOSIS — T84216A Breakdown (mechanical) of internal fixation device of vertebrae, initial encounter: Secondary | ICD-10-CM | POA: Diagnosis not present

## 2017-12-15 ENCOUNTER — Other Ambulatory Visit: Payer: Self-pay | Admitting: Neurosurgery

## 2017-12-15 ENCOUNTER — Encounter (HOSPITAL_COMMUNITY): Payer: Self-pay

## 2017-12-15 NOTE — Pre-Procedure Instructions (Signed)
AMED DATTA  12/15/2017      [place], [place] [address] Phone: [phone] Fax: [phone]    Your procedure is scheduled on December 23, 2017.  Report to [facility] Admitting at 530 AM.  Call this number if you have problems the morning of surgery:  [phone]   Remember:  Do not eat or drink after midnight.    Take these medicines the morning of surgery with A SIP OF WATER  Cogentin (benztropine) buproprion (wellbutrin) Buspirone (buspar) Cyclobenzaprine (flexeril) Esomeprazole (nexium) Hydroxyzine (atarax) Lamotrigine (lamictal) Levothyroxine (synthroid) Nitrostat-if needed for chest pain Oxycodone-if needed for pain Phenergan-if needed for nausea  7 days prior to surgery STOP taking any Aspirin (unless otherwise instructed by your surgeon), Aleve, Naproxen, Ibuprofen, Motrin, Advil, Goody's, BC's, all herbal medications, fish oil, and all vitamins    Do not wear jewelry, make-up or nail polish.  Do not wear lotions, powders, or perfumes, or deodorant.  Men may shave face and neck.  Do not bring valuables to the hospital.  [facility] is not responsible for any belongings or valuables.  Contacts, dentures or bridgework may not be worn into surgery.  Leave your suitcase in the car.  After surgery it may be brought to your room.  For patients admitted to the hospital, discharge time will be determined by your treatment team.  Patients discharged the day of surgery will not be allowed to drive home.    [facility]- Preparing For Surgery  Before surgery, you can play an important role. Because skin is not sterile, your skin needs to be as free of germs as possible. You can reduce the number of germs on your skin by washing with CHG (chlorahexidine gluconate) Soap before surgery.  CHG is an antiseptic cleaner which kills germs and bonds with the skin to continue killing germs even after  washing.    Oral Hygiene is also important to reduce your risk of infection.  Remember - BRUSH YOUR TEETH THE MORNING OF SURGERY WITH YOUR REGULAR TOOTHPASTE  Please do not use if you have an allergy to CHG or antibacterial soaps. If your skin becomes reddened/irritated stop using the CHG.  Do not shave (including legs and underarms) for at least 48 hours prior to first CHG shower. It is OK to shave your face.  Please follow these instructions carefully.   1. Shower the NIGHT BEFORE SURGERY and the MORNING OF SURGERY with CHG.   2. If you chose to wash your hair, wash your hair first as usual with your normal shampoo.  3. After you shampoo, rinse your hair and body thoroughly to remove the shampoo.  4. Use CHG as you would any other liquid soap. You can apply CHG directly to the skin and wash gently with a scrungie or a clean washcloth.   5. Apply the CHG Soap to your body ONLY FROM THE NECK DOWN.  Do not use on open wounds or open sores. Avoid contact with your eyes, ears, mouth and genitals (private parts). Wash Face and genitals (private parts)  with your normal soap.  6. Wash thoroughly, paying special attention to the area where your surgery will be performed.  7. Thoroughly rinse your body with warm water from the neck down.  8. DO NOT shower/wash with your normal soap after using and rinsing off the CHG Soap.  9. Pat yourself dry with a CLEAN TOWEL.  10. Wear  CLEAN PAJAMAS to bed the night before surgery, wear comfortable clothes the morning of surgery  11. Place CLEAN SHEETS on your bed the night of your first shower and DO NOT SLEEP WITH PETS.  Day of Surgery:  Do not apply any deodorants/lotions.  Please wear clean clothes to the hospital/surgery center.   Remember to brush your teeth WITH YOUR REGULAR TOOTHPASTE.   Please read over the following fact sheets that you were given. Pain Booklet, Coughing and Deep Breathing, MRSA Information and Surgical Site Infection  Prevention

## 2017-12-16 ENCOUNTER — Encounter (HOSPITAL_COMMUNITY): Payer: Self-pay

## 2017-12-16 ENCOUNTER — Encounter (HOSPITAL_COMMUNITY)
Admission: RE | Admit: 2017-12-16 | Discharge: 2017-12-16 | Disposition: A | Discharge status: Home / Self Care | Payer: 59 | Source: Ambulatory Visit | Attending: Neurosurgery | Admitting: Neurosurgery

## 2017-12-16 ENCOUNTER — Other Ambulatory Visit: Payer: Self-pay | Admitting: Anesthesiology

## 2017-12-16 ENCOUNTER — Other Ambulatory Visit: Payer: Self-pay

## 2017-12-16 DIAGNOSIS — Z01812 Encounter for preprocedural laboratory examination: Secondary | ICD-10-CM | POA: Insufficient documentation

## 2017-12-16 DIAGNOSIS — T85192A Other mechanical complication of implanted electronic neurostimulator (electrode) of spinal cord, initial encounter: Secondary | ICD-10-CM | POA: Diagnosis not present

## 2017-12-16 LAB — COMPREHENSIVE METABOLIC PANEL
ALBUMIN: 3.9 g/dL (ref 3.5–5.0)
ALT: 14 U/L (ref 0–44)
ANION GAP: 9 (ref 5–15)
AST: 19 U/L (ref 15–41)
Alkaline Phosphatase: 82 U/L (ref 38–126)
BUN: 13 mg/dL (ref 6–20)
CALCIUM: 9.7 mg/dL (ref 8.9–10.3)
CHLORIDE: 108 mmol/L (ref 98–111)
CO2: 24 mmol/L (ref 22–32)
Creatinine, Ser: 1.2 mg/dL (ref 0.61–1.24)
GFR calc Af Amer: >60 mL/min (ref >60)
GFR calc non Af Amer: >60 mL/min (ref >60)
GLUCOSE: 115 mg/dL — AB (ref 70–99)
Potassium: 3.7 mmol/L (ref 3.5–5.1)
SODIUM: 141 mmol/L (ref 135–145)
Total Bilirubin: 0.7 mg/dL (ref 0.3–1.2)
Total Protein: 6.8 g/dL (ref 6.5–8.1)

## 2017-12-16 LAB — CBC
HEMATOCRIT: 41 % (ref 39.0–52.0)
HEMOGLOBIN: 14.1 g/dL (ref 13.0–17.0)
MCH: 29.6 pg (ref 26.0–34.0)
MCHC: 34.4 g/dL (ref 30.0–36.0)
MCV: 86.1 fL (ref 78.0–100.0)
Platelets: 402 10*3/uL — ABNORMAL HIGH (ref 150–400)
RBC: 4.76 MIL/uL (ref 4.22–5.81)
RDW: 12.1 % (ref 11.5–15.5)
WBC: 9.4 10*3/uL (ref 4.0–10.5)

## 2017-12-16 LAB — SURGICAL PCR SCREEN
MRSA, PCR: NEGATIVE
STAPHYLOCOCCUS AUREUS: POSITIVE — AB

## 2017-12-16 NOTE — Progress Notes (Signed)
[facility]  [facility]  Chest x-ray - Denies  EKG - 3 mo- Req'd- [facility]  Stress Test - 03/06/15 (E)  ECHO - 02/2015 (E)  Cardiac Cath - 08/03/15 (E)  Sleep Study - Denies CPAP - None  LABS- 12/16/17: CBC, CMP  ASA- Denies   Anesthesia- Yes- Req'd records  Pt denies having chest pain, sob, or fever at this time. All instructions explained to the pt, with a verbal understanding of the material. Pt agrees to go over the instructions while at home for a better understanding. The opportunity to ask questions was provided.

## 2017-12-16 NOTE — Progress Notes (Signed)
Pt made aware of pcr result positive for Staph. [facility] to [facility].

## 2017-12-17 NOTE — Progress Notes (Signed)
Anesthesia Chart Review:  Case:  [id number] Date/Time:  12/23/17 0715   Procedure:  spinal cord stimulator ipg revision - exchange Plate and screw removal  with  replacement of C3-4 (N/A )   Anesthesia type:  General   Pre-op diagnosis:  hardware failure  - malfunction of spinal cord stimulator   Location:  [facility] OR ROOM 18 / [place] [facility]   Surgeon:  Kary Kos, MD      DISCUSSION: 52 yo male former smoker for above procedure. Pertinent medical hx includes anxiety, chronic pain (including chest, abdomen, and back), hypothyroid, fibromyalgia, AKF due to rhabdo, Bipolar, GERD, Depression, DOE, HTN.  Pt had recent C3-4 ACDF by Dr. Saintclair Halsted 09/21/2017 without anesthetic complication.   Pt has had numerous visits for c/o chest pain. He has multiple negative workups with diagnosis of atypical chest pain.   He had a LHC 08/31/2015 showed angiographically minimal CAD.  Mild LAD to distal LAD lesion, 35% stenosed.  Proximal circumflex lesion, 40% stenosed.  The left ventricular systolic function is normal with ejection fraction 55 to 65%.   Myocardial perfusion imaging done 03/06/2015 showed: Resting ECG shows normal sinus rhythm.  The resting and stress ECG shows normal ST segment and no ventricular tachycardia, significant QRS prolongation, or heart block.  Both the rest and stress images are within normal limits.  No significant reversible ischemia or fixed scar.  Gated left ventricular ejection fraction is low normal 49%.  Normal LV segmental wall motion.  ECHO from 02/20/2015 shows EF 55-60%.  Anticipate can proceed with surgery as planned barring acute status change.  VS: BP 127/80   Pulse (!) 107 Comment: notified Adrain RN  Temp 36.8 C   Resp 20   Ht 6' (1.829 m)   Wt 217 lb 8 oz (98.7 kg)   SpO2 98%   BMI 29.50 kg/m   PROVIDERS: Secundino Ginger, PA-C is PCP  Lawson Radar is Cardiologist  LABS: Labs reviewed: Acceptable for surgery. (all labs ordered are listed, but only abnormal results are  displayed)  Labs Reviewed  SURGICAL PCR SCREEN - Abnormal; Notable for the following components:      Result Value   Staphylococcus aureus POSITIVE (*)    All other components within normal limits  COMPREHENSIVE METABOLIC PANEL - Abnormal; Notable for the following components:   Glucose, Bld 115 (*)    All other components within normal limits  CBC - Abnormal; Notable for the following components:   Platelets 402 (*)    All other components within normal limits     IMAGES:  PORTABLE CHEST 1 VIEW 09/08/2016 CLINICAL DATA:  Initial evaluation for central line complication. COMPARISON:  Prior radiograph from 09/05/2016.  FINDINGS: Left IJ approach central venous catheter in place with tip overlying the confluence of the brachiocephalic vein/ SVC. Tip projects slightly horizontally. This is similar to previous. Cardiac and mediastinal silhouettes are stable, and remain within normal limits.  Lungs normally inflated. Minimal residual right basilar atelectasis, decreased from prior. No new focal infiltrates. No pulmonary edema or pleural effusion. No pneumothorax.  Osseous structures unchanged.  IMPRESSION: 1. Tip of left IJ approach central venous catheter overlying the confluence of the left brachiocephalic vein/SVC, stable. 2. Minimal residual right basilar atelectasis, decreased from prior. 3. No other active cardiopulmonary disease.  PORTABLE CHEST 1 VIEW 08/30/2016 FINDINGS: There is persistent right lung base opacity again most likely atelectasis. Lung volumes are relatively low, more noted on the right where there is elevation of the right hemidiaphragm.  Remainder of the lungs is clear.  Heart, mediastinum and hila are unremarkable.  No convincing pleural effusion.  No pneumothorax.  Right internal jugular central venous line has been removed. Left internal jugular central venous line is stable.  IMPRESSION: 1. Persistent right lung base atelectasis. No  new lung abnormalities.   EKG: 11/05/2017 (see copy in pt chart): Normal sinus rhythm.  Incomplete right bundle branch block.  CV: LHC 08/31/2015 1. Angiographically minimal CAD. Mid LAD to Dist LAD lesion, 35% stenosed. Prox Cx lesion, 40% stenosed. 2. The left ventricular systolic function is normal.   The patient does have minimal CAD angiographically, but did respond to IC nitroglycerin. As a smoker, cannot discount microvascular disease versus diffuse coronary spasm. Consider long-lasting nitrate.  Myocardial perfusion 03/06/2015  Resting ECG shows normal sinus rhythm.  The resting and stress ECG shows normal ST segment and no ventricular tachycardia, significant QRS prolongation, or heart block.  Both the rest and stress images are within normal limits.  No significant reversible ischemia or fixed scar.  Gated left ventricular ejection fraction is low normal 49%.  Normal LV segmental wall motion.  ECHO 02/20/2015 (under media tab encounter same date): 1.  There is mild concentric left ventricular hypertrophy. 2.  The tricuspid valve appears structurally normal. 3.  Mild to moderate tricuspid regurgitation present. 4.  There is no evidence of pulmonary hypertension. 5.  Normal cardiac chamber size and function; normal valve anatomy; no pericardial effusion or intracardiac mass.  No intracardiac shunts by 2-dimensional and color flow imaging.  Normal thoracic aorta and aortic arch. 6.  EF 55 to 60%  Past Medical History:  Diagnosis Date  . Accelerated hypertension 08/23/2016   pt denies this  . Anxiety   . Arthritis   . Bipolar affective disorder ([place])   . Chronic back pain   . Chronic kidney disease 2017   AKF - due to Rhabdomyolysis  . DDD (degenerative disc disease)   . Depression   . DJD (degenerative joint disease)   . Elevated liver function tests   . Fibromyalgia   . GERD (gastroesophageal reflux disease)   . Head injury   . Head injury, closed, with concussion    x 3   from falls  . Hyperlipidemia   . Hypothyroidism   . IBS (irritable bowel syndrome)    with diarrhea  . Inguinal hernia    left  . Insomnia   . Myofascial pain syndrome   . Osteopenia   . Pneumonia   . Shortness of breath dyspnea    with exertion  . Tibial plateau fracture, left   . Tremors of nervous system     Past Surgical History:  Procedure Laterality Date  . ANTERIOR CERVICAL DECOMP/DISCECTOMY FUSION N/A 09/21/2017   Procedure: ANTERIOR CERVICAL DECOMPRESSION/DISCECTOMY FUSION - CERVICAL THREE-CERVICAL FOUR;  Surgeon: Kary Kos, MD;  Location: [place];  Service: Neurosurgery;  Laterality: N/A;  . CARDIAC CATHETERIZATION N/A 08/31/2015   Procedure: Left Heart Cath and Coronary Angiography;  Surgeon: Leonie Man, MD;  Location: [place] CV LAB;  Service: Cardiovascular;  Laterality: N/A;  . Carotid Dopplers Bilateral 02/20/2015   [facility]: Mild, less than 39% left and right internal carotid artery stenosis. No significant plaque burden  . CERVICAL DISC SURGERY     C5-7  . COLONOSCOPY    . COLONOSCOPY    . ESOPHAGOGASTRODUODENOSCOPY    . FASCIOTOMY Left 10/20/2013   Procedure: LEFT leg ANTERIOR COMPARTMENT FACSCIOTOMY;  Surgeon: Astrid Divine  Marcelino Scot, MD;  Location: [place];  Service: Orthopedics;  Laterality: Left;  . INGUINAL HERNIA REPAIR Left   . Nuclear Stress Test  03/06/2015   [facility]: Normal EKG. Low normal EF (49%) normal regional wall motion. No evidence of ischemia or infarction.  . ORIF TIBIA PLATEAU Left 10/20/2013   Procedure: OPEN REDUCTION INTERNAL FIXATION (ORIF) LEFT TIBIAL PLATEAU;  Surgeon: Rozanna Box, MD;  Location: [place];  Service: Orthopedics;  Laterality: Left;  . SEPTOPLASTY    . SPINAL CORD STIMULATOR BATTERY EXCHANGE N/A 02/08/2016   Procedure: Lumbar spinal cord stimulator implantable pulse generator replacement;  Surgeon: Clydell Hakim, MD;  Location: [place] NEURO ORS;  Service: Neurosurgery;  Laterality: N/A;  . SPINAL CORD  STIMULATOR IMPLANT    . TRANSTHORACIC ECHOCARDIOGRAM  02/20/2015   [facility]: Mild concentric LVH. EF 55-60%. Normal regional wall motion. Mild to moderate TR with no significant pulmonary hypertension.    MEDICATIONS: . amitriptyline (ELAVIL) 25 MG tablet  . aspirin EC 81 MG EC tablet  . benztropine (COGENTIN) 0.5 MG tablet  . benztropine (COGENTIN) 1 MG tablet  . buPROPion (WELLBUTRIN XL) 150 MG 24 hr tablet  . busPIRone (BUSPAR) 15 MG tablet  . cyclobenzaprine (FLEXERIL) 10 MG tablet  . esomeprazole (NEXIUM) 40 MG capsule  . hydrOXYzine (ATARAX/VISTARIL) 25 MG tablet  . hydrOXYzine (ATARAX/VISTARIL) 50 MG tablet  . lamoTRIgine (LAMICTAL) 100 MG tablet  . levothyroxine (SYNTHROID, LEVOTHROID) 50 MCG tablet  . nicotine (NICODERM CQ - DOSED IN MG/24 HOURS) 14 mg/24hr patch  . nicotine (NICODERM CQ - DOSED IN MG/24 HR) 7 mg/24hr patch  . nicotine polacrilex (COMMIT) 4 MG lozenge  . nitroGLYCERIN (NITROSTAT) 0.4 MG SL tablet  . OLANZapine (ZYPREXA) 20 MG tablet  . Omega-3 Fatty Acids (FISH OIL PO)  . oxyCODONE (ROXICODONE) 15 MG immediate release tablet  . pantoprazole (PROTONIX) 40 MG tablet  . promethazine (PHENERGAN) 25 MG tablet  . Suvorexant (BELSOMRA) 10 MG TABS  . Vitamin D, Ergocalciferol, (DRISDOL) 50000 units CAPS capsule   No current facility-administered medications for this encounter.     Wynonia Musty [facility] Short Stay Center/Anesthesiology Phone [phone] 12/17/2017 1:02 PM

## 2017-12-21 NOTE — H&P (Signed)
[name] is an 52 y.o. male.   Chief Complaint: Failed SCS battery (IPG) HPI:  He had replacement of his Saint Jude/Abbott IPG in 2017, and was doing well.  Unfortunately, this IPG appears to have failed as well.  He is scheduled for revision ACDF, and requested that we replace his IPG in the same operative setting.  I had a chance to review his updated medical history, surgical history, medications, allergies.  He still continues to smoke.   Past Medical History:  Diagnosis Date  . Accelerated hypertension 08/23/2016   pt denies this  . Anxiety   . Arthritis   . Bipolar affective disorder ([place])   . Chronic back pain   . Chronic kidney disease 2017   AKF - due to Rhabdomyolysis  . DDD (degenerative disc disease)   . Depression   . DJD (degenerative joint disease)   . Elevated liver function tests   . Fibromyalgia   . GERD (gastroesophageal reflux disease)   . Head injury   . Head injury, closed, with concussion    x 3  from falls  . Hyperlipidemia   . Hypothyroidism   . IBS (irritable bowel syndrome)    with diarrhea  . Inguinal hernia    left  . Insomnia   . Myofascial pain syndrome   . Osteopenia   . Pneumonia   . Shortness of breath dyspnea    with exertion  . Tibial plateau fracture, left   . Tremors of nervous system     Past Surgical History:  Procedure Laterality Date  . ANTERIOR CERVICAL DECOMP/DISCECTOMY FUSION N/A 09/21/2017   Procedure: ANTERIOR CERVICAL DECOMPRESSION/DISCECTOMY FUSION - CERVICAL THREE-CERVICAL FOUR;  Surgeon: Kary Kos, MD;  Location: [place];  Service: Neurosurgery;  Laterality: N/A;  . CARDIAC CATHETERIZATION N/A 08/31/2015   Procedure: Left Heart Cath and Coronary Angiography;  Surgeon: Leonie Man, MD;  Location: [place] [facility];  Service: Cardiovascular;  Laterality: N/A;  . Carotid Dopplers Bilateral 02/20/2015   [facility]: Mild, less than 39% left and right internal carotid artery stenosis. No significant plaque  burden  . CERVICAL DISC SURGERY     C5-7  . COLONOSCOPY    . COLONOSCOPY    . ESOPHAGOGASTRODUODENOSCOPY    . FASCIOTOMY Left 10/20/2013   Procedure: LEFT leg ANTERIOR COMPARTMENT FACSCIOTOMY;  Surgeon: Rozanna Box, MD;  Location: [place];  Service: Orthopedics;  Laterality: Left;  . INGUINAL HERNIA REPAIR Left   . Nuclear Stress Test  03/06/2015   [facility]: Normal EKG. Low normal EF (49%) normal regional wall motion. No evidence of ischemia or infarction.  . ORIF TIBIA PLATEAU Left 10/20/2013   Procedure: OPEN REDUCTION INTERNAL FIXATION (ORIF) LEFT TIBIAL PLATEAU;  Surgeon: Rozanna Box, MD;  Location: [place];  Service: Orthopedics;  Laterality: Left;  . SEPTOPLASTY    . SPINAL CORD STIMULATOR BATTERY EXCHANGE N/A 02/08/2016   Procedure: Lumbar spinal cord stimulator implantable pulse generator replacement;  Surgeon: Clydell Hakim, MD;  Location: [place] [facility];  Service: Neurosurgery;  Laterality: N/A;  . SPINAL CORD STIMULATOR IMPLANT    . TRANSTHORACIC ECHOCARDIOGRAM  02/20/2015   [facility]: Mild concentric LVH. EF 55-60%. Normal regional wall motion. Mild to moderate TR with no significant pulmonary hypertension.    Family History  Problem Relation Age of Onset  . Heart disease Mother   . Hypertension Mother   . Sudden death Mother        Presumably  cardiac  . Hyperlipidemia Father   . Heart attack Father 50       At least 5 MIs. Had CABG.  . Heart failure Father   . Diabetes Father   . Hypertension Father   . Kidney disease Father   . Diabetes Brother   . Lung cancer Maternal Grandmother   . Diabetes Paternal Grandmother   . Sudden death Paternal Grandmother        Unclear etiology  . Diabetes Unknown   . Kidney disease Unknown   . Kidney disease Brother   . Hypertension Brother   . Sudden death Brother        Thought to be related to heart disease  . Throat cancer Brother   . Colon cancer Neg Hx   . Mental illness Neg Hx    Social  History:  reports that he quit smoking about 4 months ago. His smoking use included cigarettes. He has a 35.00 pack-year smoking history. He has never used smokeless tobacco. He reports that he has current or past drug history. Drugs: Oxycodone and Cocaine. He reports that he does not drink alcohol.  Allergies:  Allergies  Allergen Reactions  . Zolpidem Tartrate Other (See Comments)    Hallucinations and sleep walks  . Lyrica [Pregabalin] Other (See Comments)    Causes extreme depression  . Demerol [Meperidine] Itching, Rash and Hives    Medications Prior to Admission  Medication Sig Dispense Refill  . amitriptyline (ELAVIL) 25 MG tablet Take 3 tablets (75 mg total) by mouth at bedtime. For insomnia/depression 90 tablet 0  . benztropine (COGENTIN) 1 MG tablet Take 1 mg by mouth 2 (two) times daily.    Marland Kitchen buPROPion (WELLBUTRIN XL) 150 MG 24 hr tablet Take 1 tablet (150 mg total) by mouth daily. For depression 30 tablet 0  . busPIRone (BUSPAR) 15 MG tablet Take 1 tablet (15 mg total) by mouth 2 (two) times daily. For anxiety 60 tablet 0  . cyclobenzaprine (FLEXERIL) 10 MG tablet Take 1 tablet (10 mg total) by mouth 3 (three) times daily as needed for muscle spasms. 1 tablet 0  . esomeprazole (NEXIUM) 40 MG capsule Take 40 mg by mouth daily at 12 noon.    . hydrOXYzine (ATARAX/VISTARIL) 50 MG tablet Take 50 mg by mouth 3 (three) times daily as needed for anxiety.    . lamoTRIgine (LAMICTAL) 100 MG tablet Take 0.5 tablets (50 mg total) by mouth 2 (two) times daily. For mood stabilization 60 tablet 0  . levothyroxine (SYNTHROID, LEVOTHROID) 50 MCG tablet Take 1 tablet (50 mcg total) by mouth daily before breakfast. For hypothyroidism    . nicotine (NICODERM CQ - DOSED IN MG/24 HR) 7 mg/24hr patch Place 7 mg onto the skin daily.    . nicotine polacrilex (COMMIT) 4 MG lozenge Take 4 mg by mouth as needed for smoking cessation.    Marland Kitchen OLANZapine (ZYPREXA) 20 MG tablet Take 1 tablet (20 mg total) by  mouth at bedtime. For mood control 30 tablet 0  . Omega-3 Fatty Acids (FISH OIL PO) Take 2,000 mg by mouth 2 (two) times daily.    Marland Kitchen oxyCODONE (ROXICODONE) 15 MG immediate release tablet Take 1 tablet (15 mg total) by mouth 4 (four) times daily. For chronic pain 1 tablet 0  . Suvorexant (BELSOMRA) 10 MG TABS Take 10 mg by mouth at bedtime.    . Vitamin D, Ergocalciferol, (DRISDOL) 50000 units CAPS capsule Take 1 capsule (50,000 Units total) by mouth every Thursday.  For calcium supplementation 1 capsule 0  . aspirin EC 81 MG EC tablet Take 1 tablet (81 mg total) by mouth daily. For heart health (Patient not taking: Reported on 12/11/2017)    . benztropine (COGENTIN) 0.5 MG tablet Take 1 tablet (0.5 mg total) by mouth 2 (two) times daily as needed (EPS). (Patient not taking: Reported on 12/11/2017) 60 tablet 0  . hydrOXYzine (ATARAX/VISTARIL) 25 MG tablet Take 1 tablet (25 mg total) by mouth every 8 (eight) hours as needed for anxiety. For anxiety (Patient not taking: Reported on 12/11/2017) 75 tablet 0  . nicotine (NICODERM CQ - DOSED IN MG/24 HOURS) 14 mg/24hr patch Place 1 patch (14 mg total) onto the skin daily. (may purchase from over the counter): For smoking cessation (Patient not taking: Reported on 12/11/2017) 28 patch 0  . nitroGLYCERIN (NITROSTAT) 0.4 MG SL tablet Place 1 tablet (0.4 mg total) under the tongue every 5 (five) minutes as needed for chest pain.  12  . pantoprazole (PROTONIX) 40 MG tablet Take 1 tablet (40 mg total) by mouth daily at 12 noon. For acid reflux (Patient not taking: Reported on 12/11/2017)    . promethazine (PHENERGAN) 25 MG tablet Take 25 mg by mouth every 8 (eight) hours as needed for nausea or vomiting.      No results found for this or any previous visit (from the past 48 hour(s)). No results found.  Review of Systems  Constitutional: Negative.   HENT: Negative.   Eyes: Negative.   Respiratory: Negative.   Cardiovascular: Negative.   Gastrointestinal:  Negative.   Genitourinary: Negative.   Musculoskeletal: Positive for back pain. Negative for myalgias.  Skin: Negative.   Neurological: Negative.   Endo/Heme/Allergies: Negative.   Psychiatric/Behavioral: Negative.     Blood pressure 140/84, pulse 82, temperature 98.4 F (36.9 C), temperature source Oral, resp. rate 18, SpO2 98 %. Physical Exam  Constitutional: He is oriented to person, place, and time. He appears well-developed and well-nourished.  HENT:  Head: Normocephalic and atraumatic.  Eyes: Pupils are equal, round, and reactive to light.  Cardiovascular: Normal rate.  Musculoskeletal: Normal range of motion.  Neurological: He is alert and oriented to person, place, and time.  Skin: Skin is warm and dry.  Psychiatric: He has a normal mood and affect. His behavior is normal. Judgment and thought content normal.     Assessment/Plan  lumbar post-laminectomy syndrome, SCS IPG failure  plan:  IPG replacement, Saint Jude/Abbott.  Will maintain the IPG in High pronation or protected mode while he undergoes ACDF,  Bonna Gains, MD 12/23/2017, 7:10 AM

## 2017-12-23 ENCOUNTER — Ambulatory Visit (HOSPITAL_COMMUNITY): Payer: 59 | Admitting: Physician Assistant

## 2017-12-23 ENCOUNTER — Ambulatory Visit (HOSPITAL_COMMUNITY): Payer: 59 | Admitting: Anesthesiology

## 2017-12-23 ENCOUNTER — Ambulatory Visit (HOSPITAL_COMMUNITY): Payer: 59

## 2017-12-23 ENCOUNTER — Ambulatory Visit (HOSPITAL_COMMUNITY)
Admission: RE | Disposition: A | Discharge status: Home / Self Care | Payer: Self-pay | Source: Ambulatory Visit | Attending: Neurosurgery

## 2017-12-23 ENCOUNTER — Other Ambulatory Visit: Payer: Self-pay

## 2017-12-23 ENCOUNTER — Ambulatory Visit (HOSPITAL_COMMUNITY)
Admission: RE | Admit: 2017-12-23 | Discharge: 2017-12-24 | Disposition: A | Discharge status: Home / Self Care | Payer: 59 | Source: Ambulatory Visit | Attending: Neurosurgery | Admitting: Neurosurgery

## 2017-12-23 ENCOUNTER — Encounter (HOSPITAL_COMMUNITY): Payer: Self-pay | Admitting: *Deleted

## 2017-12-23 DIAGNOSIS — S129XXA Fracture of neck, unspecified, initial encounter: Secondary | ICD-10-CM | POA: Diagnosis present

## 2017-12-23 DIAGNOSIS — I2510 Atherosclerotic heart disease of native coronary artery without angina pectoris: Secondary | ICD-10-CM | POA: Insufficient documentation

## 2017-12-23 DIAGNOSIS — T85113A Breakdown (mechanical) of implanted electronic neurostimulator, generator, initial encounter: Secondary | ICD-10-CM | POA: Diagnosis not present

## 2017-12-23 DIAGNOSIS — Z79899 Other long term (current) drug therapy: Secondary | ICD-10-CM | POA: Diagnosis not present

## 2017-12-23 DIAGNOSIS — M4322 Fusion of spine, cervical region: Secondary | ICD-10-CM | POA: Diagnosis not present

## 2017-12-23 DIAGNOSIS — F419 Anxiety disorder, unspecified: Secondary | ICD-10-CM | POA: Insufficient documentation

## 2017-12-23 DIAGNOSIS — M961 Postlaminectomy syndrome, not elsewhere classified: Secondary | ICD-10-CM | POA: Insufficient documentation

## 2017-12-23 DIAGNOSIS — F329 Major depressive disorder, single episode, unspecified: Secondary | ICD-10-CM | POA: Insufficient documentation

## 2017-12-23 DIAGNOSIS — Z87891 Personal history of nicotine dependence: Secondary | ICD-10-CM | POA: Diagnosis not present

## 2017-12-23 DIAGNOSIS — M960 Pseudarthrosis after fusion or arthrodesis: Secondary | ICD-10-CM | POA: Insufficient documentation

## 2017-12-23 DIAGNOSIS — Y929 Unspecified place or not applicable: Secondary | ICD-10-CM | POA: Insufficient documentation

## 2017-12-23 DIAGNOSIS — T84226A Displacement of internal fixation device of vertebrae, initial encounter: Secondary | ICD-10-CM | POA: Diagnosis not present

## 2017-12-23 DIAGNOSIS — T85199A Other mechanical complication of other implanted electronic stimulator of nervous system, initial encounter: Secondary | ICD-10-CM | POA: Diagnosis not present

## 2017-12-23 DIAGNOSIS — T85698A Other mechanical complication of other specified internal prosthetic devices, implants and grafts, initial encounter: Secondary | ICD-10-CM | POA: Diagnosis not present

## 2017-12-23 DIAGNOSIS — Z7982 Long term (current) use of aspirin: Secondary | ICD-10-CM | POA: Diagnosis not present

## 2017-12-23 DIAGNOSIS — Z885 Allergy status to narcotic agent status: Secondary | ICD-10-CM | POA: Insufficient documentation

## 2017-12-23 DIAGNOSIS — E039 Hypothyroidism, unspecified: Secondary | ICD-10-CM | POA: Insufficient documentation

## 2017-12-23 DIAGNOSIS — Y758 Miscellaneous neurological devices associated with adverse incidents, not elsewhere classified: Secondary | ICD-10-CM | POA: Diagnosis not present

## 2017-12-23 DIAGNOSIS — Z419 Encounter for procedure for purposes other than remedying health state, unspecified: Secondary | ICD-10-CM

## 2017-12-23 DIAGNOSIS — I129 Hypertensive chronic kidney disease with stage 1 through stage 4 chronic kidney disease, or unspecified chronic kidney disease: Secondary | ICD-10-CM | POA: Diagnosis not present

## 2017-12-23 DIAGNOSIS — N189 Chronic kidney disease, unspecified: Secondary | ICD-10-CM | POA: Insufficient documentation

## 2017-12-23 DIAGNOSIS — E785 Hyperlipidemia, unspecified: Secondary | ICD-10-CM | POA: Diagnosis not present

## 2017-12-23 DIAGNOSIS — E1122 Type 2 diabetes mellitus with diabetic chronic kidney disease: Secondary | ICD-10-CM | POA: Insufficient documentation

## 2017-12-23 DIAGNOSIS — K219 Gastro-esophageal reflux disease without esophagitis: Secondary | ICD-10-CM | POA: Diagnosis not present

## 2017-12-23 HISTORY — PX: SPINAL CORD STIMULATOR BATTERY EXCHANGE: SHX6202

## 2017-12-23 HISTORY — PX: ANTERIOR CERVICAL DECOMP/DISCECTOMY FUSION: SHX1161

## 2017-12-23 HISTORY — DX: Fracture of neck, unspecified, initial encounter: S12.9XXA

## 2017-12-23 SURGERY — ANTERIOR CERVICAL DECOMPRESSION/DISCECTOMY FUSION 1 LEVEL/HARDWARE REMOVAL
Anesthesia: General | Site: Spine Cervical

## 2017-12-23 MED ORDER — DEXAMETHASONE SODIUM PHOSPHATE 10 MG/ML IJ SOLN
10.0000 mg | INTRAMUSCULAR | Status: DC
Start: 2017-12-23 — End: 2017-12-23
  Filled 2017-12-23: qty 1

## 2017-12-23 MED ORDER — ONDANSETRON HCL 4 MG/2ML IJ SOLN: 4.0000 mg | Freq: Four times a day (QID) | INTRAMUSCULAR | Status: DC | PRN

## 2017-12-23 MED ORDER — MIDAZOLAM HCL 2 MG/2ML IJ SOLN
INTRAMUSCULAR | Status: AC
  Filled 2017-12-23: qty 2

## 2017-12-23 MED ORDER — THROMBIN (RECOMBINANT) 20000 UNITS EX SOLR
CUTANEOUS | Status: AC
  Filled 2017-12-23: qty 20000

## 2017-12-23 MED ORDER — FENTANYL CITRATE (PF) 250 MCG/5ML IJ SOLN
INTRAMUSCULAR | Status: AC
Start: 2017-12-23 — End: ?
  Filled 2017-12-23: qty 5

## 2017-12-23 MED ORDER — CEFAZOLIN SODIUM-DEXTROSE 2-4 GM/100ML-% IV SOLN
2.0000 g | Freq: Three times a day (TID) | INTRAVENOUS | Status: AC
  Administered 2017-12-23 (×2): 2 g via INTRAVENOUS
  Filled 2017-12-23 (×2): qty 100

## 2017-12-23 MED ORDER — ONDANSETRON HCL 4 MG/2ML IJ SOLN
INTRAMUSCULAR | Status: AC
  Filled 2017-12-23: qty 2

## 2017-12-23 MED ORDER — ALUM & MAG HYDROXIDE-SIMETH 200-200-20 MG/5ML PO SUSP: 30.0000 mL | Freq: Four times a day (QID) | ORAL | Status: DC | PRN

## 2017-12-23 MED ORDER — LIDOCAINE 2% (20 MG/ML) 5 ML SYRINGE
INTRAMUSCULAR | Status: AC
  Filled 2017-12-23: qty 5

## 2017-12-23 MED ORDER — ACETAMINOPHEN 325 MG PO TABS
650.0000 mg | ORAL_TABLET | ORAL | Status: DC | PRN
  Administered 2017-12-24: 650 mg via ORAL
  Filled 2017-12-23: qty 2

## 2017-12-23 MED ORDER — CYCLOBENZAPRINE HCL 10 MG PO TABS
ORAL_TABLET | ORAL | Status: AC
  Filled 2017-12-23: qty 1

## 2017-12-23 MED ORDER — OXYCODONE HCL 5 MG PO TABS
ORAL_TABLET | ORAL | Status: AC
Start: 2017-12-23 — End: 2017-12-23
  Administered 2017-12-23: 10 mg
  Filled 2017-12-23: qty 2

## 2017-12-23 MED ORDER — DIPHENHYDRAMINE HCL 50 MG/ML IJ SOLN
INTRAMUSCULAR | Status: DC | PRN
  Administered 2017-12-23: 12.5 mg via INTRAVENOUS

## 2017-12-23 MED ORDER — CYCLOBENZAPRINE HCL 10 MG PO TABS
10.0000 mg | ORAL_TABLET | Freq: Three times a day (TID) | ORAL | Status: DC | PRN
  Administered 2017-12-23 – 2017-12-24 (×3): 10 mg via ORAL
  Filled 2017-12-23 (×2): qty 1

## 2017-12-23 MED ORDER — METOCLOPRAMIDE HCL 5 MG/ML IJ SOLN: 10.0000 mg | Freq: Once | INTRAMUSCULAR | Status: DC | PRN

## 2017-12-23 MED ORDER — CYCLOBENZAPRINE HCL 10 MG PO TABS: 10.0000 mg | ORAL_TABLET | Freq: Three times a day (TID) | ORAL | Status: DC | PRN

## 2017-12-23 MED ORDER — SUVOREXANT 10 MG PO TABS: 10.0000 mg | ORAL_TABLET | Freq: Every day | ORAL | Status: DC

## 2017-12-23 MED ORDER — HYDROMORPHONE HCL 1 MG/ML IJ SOLN
1.0000 mg | INTRAMUSCULAR | Status: DC | PRN
  Filled 2017-12-23: qty 1

## 2017-12-23 MED ORDER — BACITRACIN ZINC 500 UNIT/GM EX OINT
TOPICAL_OINTMENT | CUTANEOUS | Status: AC
  Filled 2017-12-23: qty 28.35

## 2017-12-23 MED ORDER — SODIUM CHLORIDE 0.9% FLUSH: 3.0000 mL | Freq: Two times a day (BID) | INTRAVENOUS | Status: DC

## 2017-12-23 MED ORDER — BUPIVACAINE HCL (PF) 0.5 % IJ SOLN
INTRAMUSCULAR | Status: AC
  Filled 2017-12-23: qty 30

## 2017-12-23 MED ORDER — SUGAMMADEX SODIUM 200 MG/2ML IV SOLN
INTRAVENOUS | Status: DC | PRN
  Administered 2017-12-23: 200 mg via INTRAVENOUS

## 2017-12-23 MED ORDER — FENTANYL CITRATE (PF) 100 MCG/2ML IJ SOLN
INTRAMUSCULAR | Status: AC
  Filled 2017-12-23: qty 2

## 2017-12-23 MED ORDER — NICOTINE POLACRILEX 4 MG MT LOZG
4.0000 mg | LOZENGE | OROMUCOSAL | Status: DC | PRN
  Administered 2017-12-23: 4 mg via ORAL
  Filled 2017-12-23 (×2): qty 1

## 2017-12-23 MED ORDER — PROPOFOL 10 MG/ML IV BOLUS
INTRAVENOUS | Status: AC
  Filled 2017-12-23: qty 20

## 2017-12-23 MED ORDER — BUSPIRONE HCL 15 MG PO TABS
15.0000 mg | ORAL_TABLET | Freq: Two times a day (BID) | ORAL | Status: DC
  Administered 2017-12-23 – 2017-12-24 (×2): 15 mg via ORAL
  Filled 2017-12-23 (×3): qty 1

## 2017-12-23 MED ORDER — HYDROXYZINE HCL 25 MG PO TABS: 25.0000 mg | ORAL_TABLET | Freq: Three times a day (TID) | ORAL | Status: DC | PRN

## 2017-12-23 MED ORDER — LIDOCAINE-EPINEPHRINE 1 %-1:100000 IJ SOLN
INTRAMUSCULAR | Status: DC | PRN
  Administered 2017-12-23: 2.5 mL

## 2017-12-23 MED ORDER — LABETALOL HCL 5 MG/ML IV SOLN
INTRAVENOUS | Status: AC
  Filled 2017-12-23: qty 4

## 2017-12-23 MED ORDER — THROMBIN 5000 UNITS EX SOLR
CUTANEOUS | Status: AC
  Filled 2017-12-23: qty 5000

## 2017-12-23 MED ORDER — SURGIFOAM 100 EX MISC
CUTANEOUS | Status: DC | PRN
  Administered 2017-12-23: 09:00:00 via TOPICAL

## 2017-12-23 MED ORDER — ONDANSETRON HCL 4 MG PO TABS: 4.0000 mg | ORAL_TABLET | Freq: Four times a day (QID) | ORAL | Status: DC | PRN

## 2017-12-23 MED ORDER — LABETALOL HCL 5 MG/ML IV SOLN
5.0000 mg | Freq: Once | INTRAVENOUS | Status: AC
  Administered 2017-12-23: 5 mg via INTRAVENOUS

## 2017-12-23 MED ORDER — SODIUM CHLORIDE 0.9% FLUSH: 3.0000 mL | INTRAVENOUS | Status: DC | PRN

## 2017-12-23 MED ORDER — OLANZAPINE 10 MG PO TABS
20.0000 mg | ORAL_TABLET | Freq: Every day | ORAL | Status: DC
  Administered 2017-12-23: 20 mg via ORAL
  Filled 2017-12-23 (×2): qty 2

## 2017-12-23 MED ORDER — ASPIRIN EC 81 MG PO TBEC
81.0000 mg | DELAYED_RELEASE_TABLET | Freq: Every day | ORAL | Status: DC
Start: 2017-12-24 — End: 2017-12-24
  Administered 2017-12-24: 81 mg via ORAL
  Filled 2017-12-23 (×2): qty 1

## 2017-12-23 MED ORDER — PHENOL 1.4 % MT LIQD: 1.0000 | OROMUCOSAL | Status: DC | PRN

## 2017-12-23 MED ORDER — FENTANYL CITRATE (PF) 100 MCG/2ML IJ SOLN
INTRAMUSCULAR | Status: DC | PRN
  Administered 2017-12-23 (×7): 50 ug via INTRAVENOUS

## 2017-12-23 MED ORDER — NICOTINE 7 MG/24HR TD PT24
7.0000 mg | MEDICATED_PATCH | Freq: Every day | TRANSDERMAL | Status: DC
  Administered 2017-12-24: 7 mg via TRANSDERMAL
  Filled 2017-12-23: qty 1

## 2017-12-23 MED ORDER — DEXAMETHASONE SODIUM PHOSPHATE 10 MG/ML IJ SOLN
INTRAMUSCULAR | Status: AC
  Filled 2017-12-23: qty 1

## 2017-12-23 MED ORDER — CHLORHEXIDINE GLUCONATE CLOTH 2 % EX PADS: 6.0000 | MEDICATED_PAD | Freq: Once | CUTANEOUS | Status: DC

## 2017-12-23 MED ORDER — BUPIVACAINE HCL (PF) 0.25 % IJ SOLN
INTRAMUSCULAR | Status: AC
  Filled 2017-12-23: qty 30

## 2017-12-23 MED ORDER — OXYCODONE HCL 5 MG PO TABS
10.0000 mg | ORAL_TABLET | ORAL | Status: DC | PRN
  Administered 2017-12-23 – 2017-12-24 (×3): 10 mg via ORAL
  Filled 2017-12-23 (×4): qty 2

## 2017-12-23 MED ORDER — LACTATED RINGERS IV SOLN: INTRAVENOUS | Status: DC

## 2017-12-23 MED ORDER — LAMOTRIGINE 25 MG PO TABS
50.0000 mg | ORAL_TABLET | Freq: Two times a day (BID) | ORAL | Status: DC
  Administered 2017-12-23 – 2017-12-24 (×2): 50 mg via ORAL
  Filled 2017-12-23 (×3): qty 2

## 2017-12-23 MED ORDER — BACITRACIN 50000 UNITS IM SOLR
INTRAMUSCULAR | Status: DC | PRN
  Administered 2017-12-23: 09:00:00

## 2017-12-23 MED ORDER — LIDOCAINE-EPINEPHRINE 1 %-1:100000 IJ SOLN
INTRAMUSCULAR | Status: AC
  Filled 2017-12-23: qty 1

## 2017-12-23 MED ORDER — BENZTROPINE MESYLATE 1 MG PO TABS
1.0000 mg | ORAL_TABLET | Freq: Two times a day (BID) | ORAL | Status: DC
  Administered 2017-12-23 – 2017-12-24 (×2): 1 mg via ORAL
  Filled 2017-12-23 (×3): qty 1

## 2017-12-23 MED ORDER — ONDANSETRON HCL 4 MG/2ML IJ SOLN
INTRAMUSCULAR | Status: DC | PRN
  Administered 2017-12-23: 4 mg via INTRAVENOUS

## 2017-12-23 MED ORDER — BUPROPION HCL ER (XL) 150 MG PO TB24
150.0000 mg | ORAL_TABLET | Freq: Every day | ORAL | Status: DC
  Administered 2017-12-24: 150 mg via ORAL
  Filled 2017-12-23: qty 1

## 2017-12-23 MED ORDER — FENTANYL CITRATE (PF) 100 MCG/2ML IJ SOLN
25.0000 ug | INTRAMUSCULAR | Status: DC | PRN
  Administered 2017-12-23 (×3): 50 ug via INTRAVENOUS

## 2017-12-23 MED ORDER — LACTATED RINGERS IV SOLN
INTRAVENOUS | Status: DC | PRN
  Administered 2017-12-23: 07:00:00 via INTRAVENOUS

## 2017-12-23 MED ORDER — PANTOPRAZOLE SODIUM 40 MG PO TBEC
80.0000 mg | DELAYED_RELEASE_TABLET | Freq: Every day | ORAL | Status: DC
  Filled 2017-12-23: qty 2

## 2017-12-23 MED ORDER — ACETAMINOPHEN 650 MG RE SUPP: 650.0000 mg | RECTAL | Status: DC | PRN

## 2017-12-23 MED ORDER — PROMETHAZINE HCL 25 MG PO TABS: 25.0000 mg | ORAL_TABLET | Freq: Three times a day (TID) | ORAL | Status: DC | PRN

## 2017-12-23 MED ORDER — LEVOTHYROXINE SODIUM 100 MCG PO TABS
50.0000 ug | ORAL_TABLET | Freq: Every day | ORAL | Status: DC
  Administered 2017-12-24: 50 ug via ORAL
  Filled 2017-12-23: qty 1

## 2017-12-23 MED ORDER — ROCURONIUM BROMIDE 100 MG/10ML IV SOLN
INTRAVENOUS | Status: DC | PRN
  Administered 2017-12-23: 80 mg via INTRAVENOUS
  Administered 2017-12-23: 20 mg via INTRAVENOUS

## 2017-12-23 MED ORDER — CEFAZOLIN SODIUM-DEXTROSE 2-4 GM/100ML-% IV SOLN
2.0000 g | INTRAVENOUS | Status: AC
  Administered 2017-12-23: 2 g via INTRAVENOUS
  Filled 2017-12-23: qty 100

## 2017-12-23 MED ORDER — LIDOCAINE HCL (CARDIAC) PF 100 MG/5ML IV SOSY
PREFILLED_SYRINGE | INTRAVENOUS | Status: DC | PRN
  Administered 2017-12-23: 100 mg via INTRAVENOUS

## 2017-12-23 MED ORDER — BENZTROPINE MESYLATE 0.5 MG PO TABS: 0.5000 mg | ORAL_TABLET | Freq: Two times a day (BID) | ORAL | Status: DC | PRN

## 2017-12-23 MED ORDER — VITAMIN D (ERGOCALCIFEROL) 1.25 MG (50000 UNIT) PO CAPS
50000.0000 [IU] | ORAL_CAPSULE | ORAL | Status: DC
  Administered 2017-12-24: 50000 [IU] via ORAL
  Filled 2017-12-23: qty 1

## 2017-12-23 MED ORDER — PROPOFOL 10 MG/ML IV BOLUS
INTRAVENOUS | Status: DC | PRN
  Administered 2017-12-23: 180 mg via INTRAVENOUS

## 2017-12-23 MED ORDER — MENTHOL 3 MG MT LOZG: 1.0000 | LOZENGE | OROMUCOSAL | Status: DC | PRN

## 2017-12-23 MED ORDER — SUGAMMADEX SODIUM 200 MG/2ML IV SOLN
INTRAVENOUS | Status: AC
  Filled 2017-12-23: qty 2

## 2017-12-23 MED ORDER — THROMBIN 5000 UNITS EX SOLR
OROMUCOSAL | Status: DC | PRN
  Administered 2017-12-23: 09:00:00 via TOPICAL

## 2017-12-23 MED ORDER — DEXAMETHASONE SODIUM PHOSPHATE 10 MG/ML IJ SOLN
INTRAMUSCULAR | Status: DC | PRN
  Administered 2017-12-23: 10 mg via INTRAVENOUS

## 2017-12-23 MED ORDER — BUPIVACAINE-EPINEPHRINE (PF) 0.5% -1:200000 IJ SOLN
INTRAMUSCULAR | Status: DC | PRN
  Administered 2017-12-23: 2.5 mL

## 2017-12-23 MED ORDER — MIDAZOLAM HCL 5 MG/5ML IJ SOLN
INTRAMUSCULAR | Status: DC | PRN
  Administered 2017-12-23: 2 mg via INTRAVENOUS

## 2017-12-23 MED ORDER — AMITRIPTYLINE HCL 75 MG PO TABS
75.0000 mg | ORAL_TABLET | Freq: Every day | ORAL | Status: DC
  Administered 2017-12-23: 75 mg via ORAL
  Filled 2017-12-23 (×2): qty 1

## 2017-12-23 MED ORDER — ROCURONIUM BROMIDE 10 MG/ML (PF) SYRINGE
PREFILLED_SYRINGE | INTRAVENOUS | Status: AC
  Filled 2017-12-23: qty 10

## 2017-12-23 MED ORDER — 0.9 % SODIUM CHLORIDE (POUR BTL) OPTIME
TOPICAL | Status: DC | PRN
  Administered 2017-12-23: 1000 mL

## 2017-12-23 MED ORDER — DIPHENHYDRAMINE HCL 50 MG/ML IJ SOLN
INTRAMUSCULAR | Status: AC
  Filled 2017-12-23: qty 1

## 2017-12-23 MED ORDER — OXYCODONE HCL 5 MG PO TABS
15.0000 mg | ORAL_TABLET | Freq: Four times a day (QID) | ORAL | Status: DC
  Administered 2017-12-23 – 2017-12-24 (×4): 15 mg via ORAL
  Filled 2017-12-23 (×4): qty 3

## 2017-12-23 MED ORDER — NITROGLYCERIN 0.4 MG SL SUBL: 0.4000 mg | SUBLINGUAL_TABLET | SUBLINGUAL | Status: DC | PRN

## 2017-12-23 SURGICAL SUPPLY — 72 items
BAG DECANTER FOR FLEXI CONT (MISCELLANEOUS) ×4 IMPLANT
BASKET BONE COLLECTION (BASKET) ×2 IMPLANT
BENZOIN TINCTURE PRP APPL 2/3 (GAUZE/BANDAGES/DRESSINGS) ×4 IMPLANT
BINDER ABD UNIV 12 45-62 (WOUND CARE) ×2 IMPLANT
BINDER ABDOMINAL 46IN 62IN (WOUND CARE) ×4 IMPLANT
BIT DRILL NEURO 2X3.1 SFT TUCH (MISCELLANEOUS) ×2 IMPLANT
BLADE CLIPPER SURG (BLADE) ×2 IMPLANT
BONE VIVIGEN FORMABLE 1.3CC (Bone Implant) ×4 IMPLANT
BUR MATCHSTICK NEURO 3.0 LAGG (BURR) ×4 IMPLANT
CANISTER SUCT 3000ML PPV (MISCELLANEOUS) ×4 IMPLANT
CARTRIDGE OIL MAESTRO DRILL (MISCELLANEOUS) ×2 IMPLANT
CHLORAPREP W/TINT 26ML (MISCELLANEOUS) ×4 IMPLANT
CLOSURE WOUND 1/2 X4 (GAUZE/BANDAGES/DRESSINGS) ×1
DERMABOND ADVANCED (GAUZE/BANDAGES/DRESSINGS) ×2
DERMABOND ADVANCED .7 DNX12 (GAUZE/BANDAGES/DRESSINGS) ×2 IMPLANT
DIFFUSER DRILL AIR PNEUMATIC (MISCELLANEOUS) ×4 IMPLANT
DRAPE C-ARM 42X72 X-RAY (DRAPES) ×8 IMPLANT
DRAPE LAPAROTOMY 100X72 PEDS (DRAPES) ×4 IMPLANT
DRAPE LAPAROTOMY 100X72X124 (DRAPES) ×4 IMPLANT
DRAPE MICROSCOPE LEICA (MISCELLANEOUS) ×4 IMPLANT
DRILL NEURO 2X3.1 SOFT TOUCH (MISCELLANEOUS) ×4
DRSG OPSITE POSTOP 4X6 (GAUZE/BANDAGES/DRESSINGS) ×4 IMPLANT
DURAPREP 6ML APPLICATOR 50/CS (WOUND CARE) ×4 IMPLANT
ELECT COATED BLADE 2.86 ST (ELECTRODE) ×4 IMPLANT
ELECT REM PT RETURN 9FT ADLT (ELECTROSURGICAL) ×4
ELECTRODE REM PT RTRN 9FT ADLT (ELECTROSURGICAL) ×2 IMPLANT
GAUZE SPONGE 4X4 12PLY STRL (GAUZE/BANDAGES/DRESSINGS) ×4 IMPLANT
GAUZE SPONGE 4X4 16PLY XRAY LF (GAUZE/BANDAGES/DRESSINGS) IMPLANT
GENERATOR PULSE PROCLAIM 5ELIT (Neuro Prosthesis/Implant) IMPLANT
GLOVE BIO SURGEON STRL SZ8 (GLOVE) ×6 IMPLANT
GLOVE BIOGEL PI IND STRL 7.5 (GLOVE) ×2 IMPLANT
GLOVE BIOGEL PI INDICATOR 7.5 (GLOVE) ×4
GLOVE ECLIPSE 7.5 STRL STRAW (GLOVE) ×6 IMPLANT
GLOVE INDICATOR 8.5 STRL (GLOVE) ×6 IMPLANT
GOWN STRL REUS W/ TWL LRG LVL3 (GOWN DISPOSABLE) ×2 IMPLANT
GOWN STRL REUS W/ TWL XL LVL3 (GOWN DISPOSABLE) ×2 IMPLANT
GOWN STRL REUS W/TWL 2XL LVL3 (GOWN DISPOSABLE) ×2 IMPLANT
GOWN STRL REUS W/TWL LRG LVL3 (GOWN DISPOSABLE) ×6
GOWN STRL REUS W/TWL XL LVL3 (GOWN DISPOSABLE) ×4
GRAFT BNE MATRIX VG FRMBL SM 1 (Bone Implant) IMPLANT
HALTER HD/CHIN CERV TRACTION D (MISCELLANEOUS) ×4 IMPLANT
HEMOSTAT POWDER KIT SURGIFOAM (HEMOSTASIS) ×4 IMPLANT
KIT BASIN OR (CUSTOM PROCEDURE TRAY) ×8 IMPLANT
KIT TURNOVER KIT B (KITS) ×8 IMPLANT
NDL HYPO 25X1 1.5 SAFETY (NEEDLE) ×2 IMPLANT
NEEDLE HYPO 25X1 1.5 SAFETY (NEEDLE) ×4 IMPLANT
NS IRRIG 1000ML POUR BTL (IV SOLUTION) ×4 IMPLANT
OIL CARTRIDGE MAESTRO DRILL (MISCELLANEOUS) ×4
PACK LAMINECTOMY NEURO (CUSTOM PROCEDURE TRAY) ×8 IMPLANT
PAD ARMBOARD 7.5X6 YLW CONV (MISCELLANEOUS) ×16 IMPLANT
PLATE ANT CERV XTEND 1 LV 24 (Plate) ×2 IMPLANT
PROCLAIM PROGRAMMER PATIENT (MISCELLANEOUS) ×2 IMPLANT
PULSE GENERATOR PROCLAIM 5ELIT (Neuro Prosthesis/Implant) ×4 IMPLANT
RASP 3.0MM (RASP) ×2 IMPLANT
RUBBERBAND STERILE (MISCELLANEOUS) ×8 IMPLANT
SCREW VAR 4.2 XD SELF DRILL 14 (Screw) ×4 IMPLANT
SCREW XTEND SELF DRILL 4.6X14 (Screw) ×4 IMPLANT
SPACER COLONIAL 12X14X11 0D (Spacer) ×2 IMPLANT
SPONGE INTESTINAL PEANUT (DISPOSABLE) ×4 IMPLANT
SPONGE LAP 4X18 RFD (DISPOSABLE) IMPLANT
SPONGE SURGIFOAM ABS GEL 100 (HEMOSTASIS) ×2 IMPLANT
STAPLER VISISTAT 35W (STAPLE) ×2 IMPLANT
STRIP CLOSURE SKIN 1/2X4 (GAUZE/BANDAGES/DRESSINGS) ×3 IMPLANT
SUT MNCRL AB 4-0 PS2 18 (SUTURE) ×4 IMPLANT
SUT VIC AB 2-0 CP2 18 (SUTURE) ×8 IMPLANT
SUT VIC AB 3-0 SH 8-18 (SUTURE) ×4 IMPLANT
SUT VICRYL 4-0 PS2 18IN ABS (SUTURE) ×4 IMPLANT
SYR 10ML LL (SYRINGE) IMPLANT
TOWEL GREEN STERILE (TOWEL DISPOSABLE) ×8 IMPLANT
TOWEL GREEN STERILE FF (TOWEL DISPOSABLE) ×8 IMPLANT
TRAP SPECIMEN MUCOUS 40CC (MISCELLANEOUS) ×2 IMPLANT
WATER STERILE IRR 1000ML POUR (IV SOLUTION) ×8 IMPLANT

## 2017-12-23 NOTE — Anesthesia Postprocedure Evaluation (Signed)
Anesthesia Post Note  Patient: [name]  Procedure(s) Performed: REMOVAL AND REPLACEMENT OF CERVICAL THREE-FOUR HARDWARE (N/A Spine Cervical) SPINAL CORD STIMULATOR BATTERY EXCHANGE (N/A )     Patient location during evaluation: PACU Anesthesia Type: General Level of consciousness: awake and alert Pain management: pain level controlled Vital Signs Assessment: post-procedure vital signs reviewed and stable Respiratory status: spontaneous breathing, nonlabored ventilation, respiratory function stable and patient connected to nasal cannula oxygen Cardiovascular status: blood pressure returned to baseline and stable Postop Assessment: no apparent nausea or vomiting Anesthetic complications: no    Last Vitals:  Vitals:   12/23/17 1045 12/23/17 1100  BP: (!) 151/102 (!) 148/105  Pulse: 66 70  Resp: (!) 9 11  Temp:    SpO2: 99% 96%    Last Pain:  Vitals:   12/23/17 1100  TempSrc:   PainSc: 6                  Montez Hageman

## 2017-12-23 NOTE — Op Note (Signed)
1) lumbar post-laminectomy syndrome  2) chronic pain 3) failed SCS IPG POSTOP DX: same as preop PROCEDURES PERFORMED: IPG replacement--St. Jude SURGEON:Dior Stepter  ASSISTANT: NONE  ANESTHESIA: GETA EBL: <10cc  DESCRIPTION OF PROCEDURE: After a discussion of risks, benefits and alternatives, informed consent was obtained. The patient was taken to the OR, general anesthesia induced by the anesthesia team without difficulty, turned prone onto a Jackson table, all pressure points padded, SCD's placed. A timeout was taken to verify the correct patient, position, personnel, availability of appropriate equipment, and administration of perioperative antibiotics.   The lumbar /buttock area overlying the previously placed IPG was widely prepped with chloraprep and draped into a sterile field. The skin and subcutaneous tissues around the patient's previous pocket incision was infiltrated with 0.25% bupivicaine 1:200K epinephrine. The subcutaneous pocket was incised with a 10 blade and using sharp, careful dissection the pocket opened and the IPG delivered onto the field. The pocket was inspected for hemostasis, which was found to be excellent. The leads were removed from the IPG. Scar tissue at the incision site was excised. Leads were fit into the new battery and contacts checked, with good results.   The battery and leads were carefully positioned in the pocket, and the incision was copiously irrigated with bacitracin-containing irrigation.The pocket incision was closed with a deeper layer of 2-0 vicryl interrupted sutures, and the skin closed with staples. Sterile dressings were applied. Needle, sponge, and instrument counts were correct x2 at the end of the case.   The case was then turned over to Dr. Saintclair Halsted for his operation, which he will separately  COMPLICATIONS: NONE  CONDITION: Stable throughout the course of the procedure. DISPOSITION: Per Dr. Saintclair Halsted. Discussed care with the patient and  accompanying family member. Followup in clinic will be scheduled in 10-14 days. Patient and family know that we will leave the IPG in surgical protection/hibernation mode and that he can activate the battery with the St. Jude/Abbott rep any time in the post op period.

## 2017-12-23 NOTE — Anesthesia Preprocedure Evaluation (Addendum)
Anesthesia Evaluation  Patient identified by MRN, date of birth, ID band Patient awake    Reviewed: Allergy & Precautions, NPO status , Patient's Chart, lab work & pertinent test results  Airway Mallampati: II  TM Distance: >3 FB Neck ROM: Full    Dental  (+) Dental Advisory Given, Edentulous Upper, Edentulous Lower   Pulmonary former smoker,    breath sounds clear to auscultation       Cardiovascular METS: 5 - 7 Mets hypertension, Pt. 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 Normal EF   Neuro/Psych Anxiety Depression Bipolar Disorder negative neurological ROS     GI/Hepatic Neg liver ROS, GERD  ,  Endo/Other  Hypothyroidism   Renal/GU Renal disease     Musculoskeletal  (+) Arthritis , Fibromyalgia -  Abdominal   Peds  Hematology negative hematology ROS (+)   Anesthesia Other Findings   Reproductive/Obstetrics                            Lab Results  Component Value Date   WBC 9.4 12/16/2017   HGB 14.1 12/16/2017   HCT 41.0 12/16/2017   MCV 86.1 12/16/2017   PLT 402 (H) 12/16/2017   Lab Results  Component Value Date   CREATININE 1.20 12/16/2017   BUN 13 12/16/2017   NA 141 12/16/2017   K 3.7 12/16/2017   CL 108 12/16/2017   CO2 24 12/16/2017    Anesthesia Physical  Anesthesia Plan  ASA: III  Anesthesia Plan: General   Post-op Pain Management:    Induction: Intravenous  PONV Risk Score and Plan: 2 and Ondansetron, Dexamethasone and Treatment may vary due to age or medical condition  Airway Management Planned: Oral ETT  Additional Equipment:   Intra-op Plan:   Post-operative Plan: Extubation in OR  Informed Consent: I have reviewed the patients History and Physical, chart, labs and discussed the procedure including the risks, benefits and alternatives for the proposed anesthesia with the patient or authorized  representative who has indicated his/her understanding and acceptance.   Dental advisory given  Plan Discussed with: CRNA  Anesthesia Plan Comments:         Anesthesia Quick Evaluation

## 2017-12-23 NOTE — Transfer of Care (Signed)
Immediate Anesthesia Transfer of Care Note  Patient: [name]  Procedure(s) Performed: REMOVAL AND REPLACEMENT OF CERVICAL THREE-FOUR HARDWARE (N/A Spine Cervical) SPINAL CORD STIMULATOR BATTERY EXCHANGE (N/A )  Patient Location: PACU  Anesthesia Type:General  Level of Consciousness: awake, alert  and oriented  Airway & Oxygen Therapy: Patient Spontanous Breathing and Patient connected to nasal cannula oxygen  Post-op Assessment: Report given to RN, Post -op Vital signs reviewed and stable and Patient moving all extremities X 4  Post vital signs: Reviewed and stable  Last Vitals:  Vitals Value Taken Time  BP 150/98 12/23/2017 10:14 AM  Temp    Pulse 93 12/23/2017 10:16 AM  Resp 17 12/23/2017 10:16 AM  SpO2 100 % 12/23/2017 10:16 AM  Vitals shown include unvalidated device data.  Last Pain:  Vitals:   12/23/17 1015  TempSrc:   PainSc: (P) 8       Patients Stated Pain Goal: (P) 3 (01/00/71 2197)  Complications: No apparent anesthesia complications

## 2017-12-23 NOTE — Op Note (Signed)
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  surgeon: Dominica Severin Daphney Hopke  Asst.: Glenford Peers  Anesthesia: Gen.  EBL: Minimal  History of present illness: 52 year old gentleman who 3 months ago underwent ACDF at C3-4 started developing progressive worsening neck pain and shoulder pain recurrent workup revealed subsidence and collapse with plate overriding the original plate and the bone subsiding of the C4 vertebral body. Due to patient's progression clinical syndrome imaging findings a conservative treatment I recommended revision of anterior cervical fusion. I extensively went over the risks and benefits of that operation with him as well as perioperative course expectations of outcome and alternatives of surgery and he understood and agreed to proceed forward.  Operative procedure: Patient brought into the or was induced under general anesthesia and after Dr. Maryjean Ka replaced his generator he was repositioned supine the neck in slight extension in 5 pounds of halter traction. The right side his neck was prepped and draped in routine sterile fashion. Preoperative x-ray confirmed the old incision and the adequate this was opened up the scar tissue was dissected free and the avascular plane to sternomastoid and strap muscles was developed down to the prevertebral fascia. Previous passes and dissected with Kitners. I identified the old plate both the globus and the Atlantis plate. The globus plate was very loose and I removed this plate I  drilled off the top half of the Atlantis plate just inferior to the screws. Then looked at the allograft which was very loose and did not even partially incorporated. So then drilled through the center removed the previous allograft spacer. Then scraped the endplates there was subsidence and displacement of the left-sided C3 screw violating the endplate of C3. I extend the discectomy laterally to gain adequate cartilaginous endplate purchase and due to subsidence now had a 11 mm space I read removed some the scar tissue identified the ventral epidural space and under fluoroscopy sized up and implant 11 mm 0 peek cage TPS coated packed with the beverage and mixed with the locally harvested bone dust from drilling off the previous allograft and contouring the interbody space. This was inserted flush with the anterior vertebral line. Then I selected a 107mm Globus extend plate to rescue screws were placed in the original holes for the previous C4-C6 plate. 2 new holes were drilled and C3. Fluoroscopy confirmed good position of the implant.placement additional bone dust underneath the old plate and laterally to the implant. Locked the plate in place postop fluoroscopy confirmed good position of the implant. Then the wound scope was irrigated meticulous he states was maintained and the wounds closed in layers with after Vicryl in the platysma and a running 4 subcuticular. Dermabond benzo and Steri-Strips and a sterile dressing was applied patient recovered in stable condition. At the end the case all needle counts sponge counts were correct.

## 2017-12-23 NOTE — Anesthesia Procedure Notes (Signed)
Procedure Name: Intubation Date/Time: 12/23/2017 7:36 AM Performed by: Kyung Rudd, CRNA Pre-anesthesia Checklist: Patient identified, Emergency Drugs available, Suction available and Patient being monitored Patient Re-evaluated:Patient Re-evaluated prior to induction Oxygen Delivery Method: Circle system utilized Preoxygenation: Pre-oxygenation with 100% oxygen Induction Type: IV induction Ventilation: Mask ventilation without difficulty Laryngoscope Size: Mac and 4 Grade View: Grade I Tube type: Oral Tube size: 7.5 mm Number of attempts: 1 Airway Equipment and Method: Stylet Placement Confirmation: ETT inserted through vocal cords under direct vision,  positive ETCO2 and breath sounds checked- equal and bilateral Secured at: 20 cm Tube secured with: Tape Dental Injury: Teeth and Oropharynx as per pre-operative assessment

## 2017-12-23 NOTE — H&P (Signed)
[name] is an 52 y.o. male.   Chief Complaint: neck HPI:52 year old gentleman who underwent an ACDF at C3-4-2 months ago has had progressive worsening neck pain. Workup has revealed subsidence and collapse of the plate on top of his pre-existing C4-C6 ACDF. It does appear that the fusionis solid. However the C4 screws are loose and o I recommended exploration of plate or removal and cutting off the superioraspect of the old plate for placement of a new plate. I've extensively gone over the risks and benefits perioperative course expectations of outcome and he understands and agrees to proceed forward.  Past Medical History:  Diagnosis Date  . Accelerated hypertension 08/23/2016   pt denies this  . Anxiety   . Arthritis   . Bipolar affective disorder ([place])   . Chronic back pain   . Chronic kidney disease 2017   AKF - due to Rhabdomyolysis  . DDD (degenerative disc disease)   . Depression   . DJD (degenerative joint disease)   . Elevated liver function tests   . Fibromyalgia   . GERD (gastroesophageal reflux disease)   . Head injury   . Head injury, closed, with concussion    x 3  from falls  . Hyperlipidemia   . Hypothyroidism   . IBS (irritable bowel syndrome)    with diarrhea  . Inguinal hernia    left  . Insomnia   . Myofascial pain syndrome   . Osteopenia   . Pneumonia   . Shortness of breath dyspnea    with exertion  . Tibial plateau fracture, left   . Tremors of nervous system     Past Surgical History:  Procedure Laterality Date  . ANTERIOR CERVICAL DECOMP/DISCECTOMY FUSION N/A 09/21/2017   Procedure: ANTERIOR CERVICAL DECOMPRESSION/DISCECTOMY FUSION - CERVICAL THREE-CERVICAL FOUR;  Surgeon: Kary Kos, MD;  Location: [place];  Service: Neurosurgery;  Laterality: N/A;  . CARDIAC CATHETERIZATION N/A 08/31/2015   Procedure: Left Heart Cath and Coronary Angiography;  Surgeon: Leonie Man, MD;  Location: [place] [facility];  Service: Cardiovascular;  Laterality:  N/A;  . Carotid Dopplers Bilateral 02/20/2015   [facility]: Mild, less than 39% left and right internal carotid artery stenosis. No significant plaque burden  . CERVICAL DISC SURGERY     C5-7  . COLONOSCOPY    . COLONOSCOPY    . ESOPHAGOGASTRODUODENOSCOPY    . FASCIOTOMY Left 10/20/2013   Procedure: LEFT leg ANTERIOR COMPARTMENT FACSCIOTOMY;  Surgeon: Rozanna Box, MD;  Location: [place];  Service: Orthopedics;  Laterality: Left;  . INGUINAL HERNIA REPAIR Left   . Nuclear Stress Test  03/06/2015   [facility]: Normal EKG. Low normal EF (49%) normal regional wall motion. No evidence of ischemia or infarction.  . ORIF TIBIA PLATEAU Left 10/20/2013   Procedure: OPEN REDUCTION INTERNAL FIXATION (ORIF) LEFT TIBIAL PLATEAU;  Surgeon: Rozanna Box, MD;  Location: [place];  Service: Orthopedics;  Laterality: Left;  . SEPTOPLASTY    . SPINAL CORD STIMULATOR BATTERY EXCHANGE N/A 02/08/2016   Procedure: Lumbar spinal cord stimulator implantable pulse generator replacement;  Surgeon: Clydell Hakim, MD;  Location: [place] [facility];  Service: Neurosurgery;  Laterality: N/A;  . SPINAL CORD STIMULATOR IMPLANT    . TRANSTHORACIC ECHOCARDIOGRAM  02/20/2015   [facility]: Mild concentric LVH. EF 55-60%. Normal regional wall motion. Mild to moderate TR with no significant pulmonary hypertension.    Family History  Problem Relation Age of Onset  . Heart disease  Mother   . Hypertension Mother   . Sudden death Mother        Presumably cardiac  . Hyperlipidemia Father   . Heart attack Father 34       At least 5 MIs. Had CABG.  . Heart failure Father   . Diabetes Father   . Hypertension Father   . Kidney disease Father   . Diabetes Brother   . Lung cancer Maternal Grandmother   . Diabetes Paternal Grandmother   . Sudden death Paternal Grandmother        Unclear etiology  . Diabetes Unknown   . Kidney disease Unknown   . Kidney disease Brother   . Hypertension Brother    . Sudden death Brother        Thought to be related to heart disease  . Throat cancer Brother   . Colon cancer Neg Hx   . Mental illness Neg Hx    Social History:  reports that he quit smoking about 4 months ago. His smoking use included cigarettes. He has a 35.00 pack-year smoking history. He has never used smokeless tobacco. He reports that he has current or past drug history. Drugs: Oxycodone and Cocaine. He reports that he does not drink alcohol.  Allergies:  Allergies  Allergen Reactions  . Zolpidem Tartrate Other (See Comments)    Hallucinations and sleep walks  . Lyrica [Pregabalin] Other (See Comments)    Causes extreme depression  . Demerol [Meperidine] Itching, Rash and Hives    Medications Prior to Admission  Medication Sig Dispense Refill  . amitriptyline (ELAVIL) 25 MG tablet Take 3 tablets (75 mg total) by mouth at bedtime. For insomnia/depression 90 tablet 0  . benztropine (COGENTIN) 1 MG tablet Take 1 mg by mouth 2 (two) times daily.    Marland Kitchen buPROPion (WELLBUTRIN XL) 150 MG 24 hr tablet Take 1 tablet (150 mg total) by mouth daily. For depression 30 tablet 0  . busPIRone (BUSPAR) 15 MG tablet Take 1 tablet (15 mg total) by mouth 2 (two) times daily. For anxiety 60 tablet 0  . cyclobenzaprine (FLEXERIL) 10 MG tablet Take 1 tablet (10 mg total) by mouth 3 (three) times daily as needed for muscle spasms. 1 tablet 0  . esomeprazole (NEXIUM) 40 MG capsule Take 40 mg by mouth daily at 12 noon.    . hydrOXYzine (ATARAX/VISTARIL) 50 MG tablet Take 50 mg by mouth 3 (three) times daily as needed for anxiety.    . lamoTRIgine (LAMICTAL) 100 MG tablet Take 0.5 tablets (50 mg total) by mouth 2 (two) times daily. For mood stabilization 60 tablet 0  . levothyroxine (SYNTHROID, LEVOTHROID) 50 MCG tablet Take 1 tablet (50 mcg total) by mouth daily before breakfast. For hypothyroidism    . nicotine (NICODERM CQ - DOSED IN MG/24 HR) 7 mg/24hr patch Place 7 mg onto the skin daily.    .  nicotine polacrilex (COMMIT) 4 MG lozenge Take 4 mg by mouth as needed for smoking cessation.    Marland Kitchen OLANZapine (ZYPREXA) 20 MG tablet Take 1 tablet (20 mg total) by mouth at bedtime. For mood control 30 tablet 0  . Omega-3 Fatty Acids (FISH OIL PO) Take 2,000 mg by mouth 2 (two) times daily.    Marland Kitchen oxyCODONE (ROXICODONE) 15 MG immediate release tablet Take 1 tablet (15 mg total) by mouth 4 (four) times daily. For chronic pain 1 tablet 0  . Suvorexant (BELSOMRA) 10 MG TABS Take 10 mg by mouth at bedtime.    Marland Kitchen  Vitamin D, Ergocalciferol, (DRISDOL) 50000 units CAPS capsule Take 1 capsule (50,000 Units total) by mouth every Thursday. For calcium supplementation 1 capsule 0  . aspirin EC 81 MG EC tablet Take 1 tablet (81 mg total) by mouth daily. For heart health (Patient not taking: Reported on 12/11/2017)    . benztropine (COGENTIN) 0.5 MG tablet Take 1 tablet (0.5 mg total) by mouth 2 (two) times daily as needed (EPS). (Patient not taking: Reported on 12/11/2017) 60 tablet 0  . hydrOXYzine (ATARAX/VISTARIL) 25 MG tablet Take 1 tablet (25 mg total) by mouth every 8 (eight) hours as needed for anxiety. For anxiety (Patient not taking: Reported on 12/11/2017) 75 tablet 0  . nicotine (NICODERM CQ - DOSED IN MG/24 HOURS) 14 mg/24hr patch Place 1 patch (14 mg total) onto the skin daily. (may purchase from over the counter): For smoking cessation (Patient not taking: Reported on 12/11/2017) 28 patch 0  . nitroGLYCERIN (NITROSTAT) 0.4 MG SL tablet Place 1 tablet (0.4 mg total) under the tongue every 5 (five) minutes as needed for chest pain.  12  . pantoprazole (PROTONIX) 40 MG tablet Take 1 tablet (40 mg total) by mouth daily at 12 noon. For acid reflux (Patient not taking: Reported on 12/11/2017)    . promethazine (PHENERGAN) 25 MG tablet Take 25 mg by mouth every 8 (eight) hours as needed for nausea or vomiting.      No results found for this or any previous visit (from the past 48 hour(s)). No results  found.  Review of Systems  Musculoskeletal: Positive for joint pain and neck pain.  Neurological: Positive for sensory change.    Blood pressure 140/84, pulse 82, temperature 98.4 F (36.9 C), temperature source Oral, resp. rate 18, SpO2 98 %. Physical Exam  Constitutional: He is oriented to person, place, and time. He appears well-developed.  HENT:  Head: Normocephalic.  Eyes: Pupils are equal, round, and reactive to light.  Neck: Normal range of motion.  Respiratory: Effort normal.  GI: Soft. Bowel sounds are normal.  Neurological: He is alert and oriented to person, place, and time. He has normal strength. GCS eye subscore is 4. GCS verbal subscore is 5. GCS motor subscore is 6.  Awake alert oriented 4 strength 5 out of 5deltoid, bicep, tricep, wrist flexion, w, hand intrinsics.  Skin: Skin is warm and dry.     Assessment/Plan 52 year old gentleman presents for explorationof fusion and removal of hardware with revision of anterior cervical plating.  Amilee Janvier P, MD 12/23/2017, 7:20 AM

## 2017-12-24 DIAGNOSIS — E1122 Type 2 diabetes mellitus with diabetic chronic kidney disease: Secondary | ICD-10-CM | POA: Diagnosis not present

## 2017-12-24 DIAGNOSIS — I2510 Atherosclerotic heart disease of native coronary artery without angina pectoris: Secondary | ICD-10-CM | POA: Diagnosis not present

## 2017-12-24 DIAGNOSIS — T85113A Breakdown (mechanical) of implanted electronic neurostimulator, generator, initial encounter: Secondary | ICD-10-CM | POA: Diagnosis not present

## 2017-12-24 DIAGNOSIS — M961 Postlaminectomy syndrome, not elsewhere classified: Secondary | ICD-10-CM | POA: Diagnosis not present

## 2017-12-24 DIAGNOSIS — K219 Gastro-esophageal reflux disease without esophagitis: Secondary | ICD-10-CM | POA: Diagnosis not present

## 2017-12-24 DIAGNOSIS — I129 Hypertensive chronic kidney disease with stage 1 through stage 4 chronic kidney disease, or unspecified chronic kidney disease: Secondary | ICD-10-CM | POA: Diagnosis not present

## 2017-12-24 DIAGNOSIS — F419 Anxiety disorder, unspecified: Secondary | ICD-10-CM | POA: Diagnosis not present

## 2017-12-24 DIAGNOSIS — N189 Chronic kidney disease, unspecified: Secondary | ICD-10-CM | POA: Diagnosis not present

## 2017-12-24 DIAGNOSIS — M960 Pseudarthrosis after fusion or arthrodesis: Secondary | ICD-10-CM | POA: Diagnosis not present

## 2017-12-24 MED ORDER — OXYCODONE HCL 5 MG PO TABS: 20.0000 mg | ORAL_TABLET | Freq: Three times a day (TID) | ORAL | 0 refills | Status: AC | PRN

## 2017-12-24 MED FILL — Thrombin (Recombinant) For Soln 20000 Unit: CUTANEOUS | Qty: 1 | Status: AC

## 2017-12-24 NOTE — Progress Notes (Signed)
Patient is discharged from room 3C08 at this time. Alert and in stable condition. IV site d/c'd and instructions read to patient with understanding verbalized. Left unit via wheelchair with all belongings at side. 

## 2017-12-24 NOTE — Progress Notes (Signed)
Pt A/Ox4, in no s/s apparent distress noted.  Was seen by PA earlier this morning with discharge instructions and prescriptions provided.  Pt's pain well-controlled.  Has been ambulating well and voiding adequately.  Discharged home in good condition, transported OTF via wheelchair, accompanied by family.

## 2017-12-24 NOTE — Discharge Summary (Signed)
Physician Discharge Summary  Patient ID: [name] MRN: [id number] DOB/AGE: November 24, 1965 52 y.o. Estimated body mass index is 29.43 kg/m as calculated from the following:   Height as of this encounter: 6' (1.829 m).   Weight as of this encounter: 98.4 kg (217 lb).   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 Postop the patient fair well recovered in the floor on the floor was angling and voiding spontaneously tolerating regular diet stable for discharge home. Patient will be discharged her scheduled follow-up 1-2 weeks.  Consults: Significant Diagnostic Studies: Treatments: Revision ACDF C3-4 Discharge Exam: Blood pressure 118/85, pulse (!) 107, temperature 99 F (37.2 C), temperature source Oral, resp. rate 18, height 6' (1.829 m), weight 98.4 kg (217 lb), SpO2 95 %. Strength out of 5 wound clean dry and intact  Disposition: Home   Allergies as of 12/24/2017      Reactions   Zolpidem Tartrate Other (See Comments)   Hallucinations and sleep walks   Lyrica [pregabalin] Other (See Comments)   Causes extreme depression   Demerol [meperidine] Itching, Rash, Hives      Medication List    TAKE these medications   amitriptyline 25 MG tablet Commonly known as:  ELAVIL Take 3 tablets (75 mg total) by mouth at bedtime. For insomnia/depression   aspirin 81 MG EC tablet Take 1 tablet (81 mg total) by mouth daily. For heart health   BELSOMRA 10 MG Tabs Generic drug:  Suvorexant Take 10 mg by mouth at bedtime.   benztropine 1 MG tablet Commonly known as:  COGENTIN Take 1 mg by mouth 2 (two) times daily.   benztropine 0.5 MG tablet Commonly known as:  COGENTIN Take 1  tablet (0.5 mg total) by mouth 2 (two) times daily as needed (EPS).   buPROPion 150 MG 24 hr tablet Commonly known as:  WELLBUTRIN XL Take 1 tablet (150 mg total) by mouth daily. For depression   busPIRone 15 MG tablet Commonly known as:  BUSPAR Take 1 tablet (15 mg total) by mouth 2 (two) times daily. For anxiety   cyclobenzaprine 10 MG tablet Commonly known as:  FLEXERIL Take 1 tablet (10 mg total) by mouth 3 (three) times daily as needed for muscle spasms.   esomeprazole 40 MG capsule Commonly known as:  NEXIUM Take 40 mg by mouth daily at 12 noon.   FISH OIL PO Take 2,000 mg by mouth 2 (two) times daily.   hydrOXYzine 50 MG tablet Commonly known as:  ATARAX/VISTARIL Take 50 mg by mouth 3 (three) times daily as needed for anxiety.   hydrOXYzine 25 MG tablet Commonly known as:  ATARAX/VISTARIL Take 1 tablet (25 mg total) by mouth every 8 (eight) hours as needed for anxiety. For anxiety   lamoTRIgine 100 MG tablet Commonly known as:  LAMICTAL Take 0.5 tablets (50 mg total) by mouth 2 (two) times daily. For mood stabilization   levothyroxine 50 MCG tablet Commonly known as:  SYNTHROID, LEVOTHROID Take 1 tablet (50 mcg total) by mouth daily before breakfast. For hypothyroidism   nicotine 7 mg/24hr patch Commonly known as:  NICODERM CQ - dosed in mg/24 hr Place 7 mg onto the skin daily.   nicotine 14 mg/24hr patch Commonly known as:  NICODERM CQ - dosed  in mg/24 hours Place 1 patch (14 mg total) onto the skin daily. (may purchase from over the counter): For smoking cessation   nicotine polacrilex 4 MG lozenge Commonly known as:  COMMIT Take 4 mg by mouth as needed for smoking cessation.   nitroGLYCERIN 0.4 MG SL tablet Commonly known as:  NITROSTAT Place 1 tablet (0.4 mg total) under the tongue every 5 (five) minutes as needed for chest pain.   OLANZapine 20 MG tablet Commonly known as:  ZYPREXA Take 1 tablet (20 mg total) by mouth at bedtime. For mood control    oxyCODONE 15 MG immediate release tablet Commonly known as:  ROXICODONE Take 1 tablet (15 mg total) by mouth 4 (four) times daily. For chronic pain What changed:  Another medication with the same name was added. Make sure you understand how and when to take each.   oxyCODONE 5 MG immediate release tablet Commonly known as:  ROXICODONE Take 4 tablets (20 mg total) by mouth every 8 (eight) hours as needed. What changed:  You were already taking a medication with the same name, and this prescription was added. Make sure you understand how and when to take each.   pantoprazole 40 MG tablet Commonly known as:  PROTONIX Take 1 tablet (40 mg total) by mouth daily at 12 noon. For acid reflux   promethazine 25 MG tablet Commonly known as:  PHENERGAN Take 25 mg by mouth every 8 (eight) hours as needed for nausea or vomiting.   Vitamin D (Ergocalciferol) 50000 units Caps capsule Commonly known as:  DRISDOL Take 1 capsule (50,000 Units total) by mouth every Thursday. For calcium supplementation        Signed: Elray Dains P 12/24/2017, 7:43 AM

## 2017-12-24 NOTE — Discharge Instructions (Signed)

## 2017-12-25 ENCOUNTER — Encounter (HOSPITAL_COMMUNITY): Payer: Self-pay | Admitting: Neurosurgery

## 2017-12-25 DIAGNOSIS — F3132 Bipolar disorder, current episode depressed, moderate: Secondary | ICD-10-CM | POA: Diagnosis not present

## 2017-12-25 DIAGNOSIS — E7800 Pure hypercholesterolemia, unspecified: Secondary | ICD-10-CM | POA: Diagnosis not present

## 2017-12-25 DIAGNOSIS — E039 Hypothyroidism, unspecified: Secondary | ICD-10-CM | POA: Diagnosis not present

## 2017-12-25 DIAGNOSIS — R739 Hyperglycemia, unspecified: Secondary | ICD-10-CM | POA: Diagnosis not present

## 2017-12-25 DIAGNOSIS — F411 Generalized anxiety disorder: Secondary | ICD-10-CM | POA: Diagnosis not present

## 2017-12-25 DIAGNOSIS — E559 Vitamin D deficiency, unspecified: Secondary | ICD-10-CM | POA: Diagnosis not present

## 2017-12-25 DIAGNOSIS — Z125 Encounter for screening for malignant neoplasm of prostate: Secondary | ICD-10-CM | POA: Diagnosis not present

## 2017-12-25 DIAGNOSIS — I10 Essential (primary) hypertension: Secondary | ICD-10-CM | POA: Diagnosis not present

## 2017-12-28 DIAGNOSIS — F419 Anxiety disorder, unspecified: Secondary | ICD-10-CM | POA: Diagnosis not present

## 2017-12-28 DIAGNOSIS — Q761 Klippel-Feil syndrome: Secondary | ICD-10-CM | POA: Diagnosis not present

## 2017-12-28 DIAGNOSIS — M5136 Other intervertebral disc degeneration, lumbar region: Secondary | ICD-10-CM | POA: Diagnosis not present

## 2017-12-28 DIAGNOSIS — G894 Chronic pain syndrome: Secondary | ICD-10-CM | POA: Diagnosis not present

## 2017-12-28 DIAGNOSIS — M5412 Radiculopathy, cervical region: Secondary | ICD-10-CM | POA: Diagnosis not present

## 2017-12-28 DIAGNOSIS — F1120 Opioid dependence, uncomplicated: Secondary | ICD-10-CM | POA: Diagnosis not present

## 2017-12-28 DIAGNOSIS — M47816 Spondylosis without myelopathy or radiculopathy, lumbar region: Secondary | ICD-10-CM | POA: Diagnosis not present

## 2017-12-28 DIAGNOSIS — Z79891 Long term (current) use of opiate analgesic: Secondary | ICD-10-CM | POA: Diagnosis not present

## 2018-01-06 DIAGNOSIS — E039 Hypothyroidism, unspecified: Secondary | ICD-10-CM | POA: Diagnosis not present

## 2018-01-06 DIAGNOSIS — E559 Vitamin D deficiency, unspecified: Secondary | ICD-10-CM | POA: Diagnosis not present

## 2018-01-06 DIAGNOSIS — E782 Mixed hyperlipidemia: Secondary | ICD-10-CM | POA: Diagnosis not present

## 2018-01-06 DIAGNOSIS — I10 Essential (primary) hypertension: Secondary | ICD-10-CM | POA: Diagnosis not present

## 2018-01-15 DIAGNOSIS — E039 Hypothyroidism, unspecified: Secondary | ICD-10-CM | POA: Diagnosis not present

## 2018-01-15 DIAGNOSIS — F17210 Nicotine dependence, cigarettes, uncomplicated: Secondary | ICD-10-CM | POA: Diagnosis not present

## 2018-01-15 DIAGNOSIS — B9689 Other specified bacterial agents as the cause of diseases classified elsewhere: Secondary | ICD-10-CM | POA: Diagnosis not present

## 2018-01-15 DIAGNOSIS — Z79899 Other long term (current) drug therapy: Secondary | ICD-10-CM | POA: Diagnosis not present

## 2018-01-15 DIAGNOSIS — M545 Low back pain: Secondary | ICD-10-CM | POA: Diagnosis not present

## 2018-01-15 DIAGNOSIS — K219 Gastro-esophageal reflux disease without esophagitis: Secondary | ICD-10-CM | POA: Diagnosis not present

## 2018-01-15 DIAGNOSIS — N3000 Acute cystitis without hematuria: Secondary | ICD-10-CM | POA: Diagnosis not present

## 2018-01-15 DIAGNOSIS — G8929 Other chronic pain: Secondary | ICD-10-CM | POA: Diagnosis not present

## 2018-01-15 DIAGNOSIS — Z79891 Long term (current) use of opiate analgesic: Secondary | ICD-10-CM | POA: Diagnosis not present

## 2018-01-15 DIAGNOSIS — J449 Chronic obstructive pulmonary disease, unspecified: Secondary | ICD-10-CM | POA: Diagnosis not present

## 2018-01-25 DIAGNOSIS — Q761 Klippel-Feil syndrome: Secondary | ICD-10-CM | POA: Diagnosis not present

## 2018-01-25 DIAGNOSIS — G894 Chronic pain syndrome: Secondary | ICD-10-CM | POA: Diagnosis not present

## 2018-01-25 DIAGNOSIS — Z79891 Long term (current) use of opiate analgesic: Secondary | ICD-10-CM | POA: Diagnosis not present

## 2018-01-25 DIAGNOSIS — M4802 Spinal stenosis, cervical region: Secondary | ICD-10-CM | POA: Diagnosis not present

## 2018-01-25 DIAGNOSIS — F1120 Opioid dependence, uncomplicated: Secondary | ICD-10-CM | POA: Diagnosis not present

## 2018-01-25 DIAGNOSIS — F419 Anxiety disorder, unspecified: Secondary | ICD-10-CM | POA: Diagnosis not present

## 2018-01-25 DIAGNOSIS — M47816 Spondylosis without myelopathy or radiculopathy, lumbar region: Secondary | ICD-10-CM | POA: Diagnosis not present

## 2018-01-25 DIAGNOSIS — M5412 Radiculopathy, cervical region: Secondary | ICD-10-CM | POA: Diagnosis not present

## 2018-01-25 DIAGNOSIS — M5136 Other intervertebral disc degeneration, lumbar region: Secondary | ICD-10-CM | POA: Diagnosis not present

## 2018-01-27 DIAGNOSIS — M4802 Spinal stenosis, cervical region: Secondary | ICD-10-CM | POA: Diagnosis not present

## 2018-01-29 DIAGNOSIS — M4802 Spinal stenosis, cervical region: Secondary | ICD-10-CM | POA: Diagnosis not present

## 2018-02-01 DIAGNOSIS — M4802 Spinal stenosis, cervical region: Secondary | ICD-10-CM | POA: Diagnosis not present

## 2018-02-03 DIAGNOSIS — M4802 Spinal stenosis, cervical region: Secondary | ICD-10-CM | POA: Diagnosis not present

## 2018-02-05 DIAGNOSIS — M4802 Spinal stenosis, cervical region: Secondary | ICD-10-CM | POA: Diagnosis not present

## 2018-02-12 DIAGNOSIS — M542 Cervicalgia: Secondary | ICD-10-CM | POA: Diagnosis not present

## 2018-02-12 DIAGNOSIS — Z6827 Body mass index (BMI) 27.0-27.9, adult: Secondary | ICD-10-CM | POA: Diagnosis not present

## 2018-02-15 DIAGNOSIS — M4802 Spinal stenosis, cervical region: Secondary | ICD-10-CM | POA: Diagnosis not present

## 2018-02-17 DIAGNOSIS — M4802 Spinal stenosis, cervical region: Secondary | ICD-10-CM | POA: Diagnosis not present

## 2018-02-18 DIAGNOSIS — F3132 Bipolar disorder, current episode depressed, moderate: Secondary | ICD-10-CM | POA: Diagnosis not present

## 2018-02-18 DIAGNOSIS — F411 Generalized anxiety disorder: Secondary | ICD-10-CM | POA: Diagnosis not present

## 2018-02-19 DIAGNOSIS — M4802 Spinal stenosis, cervical region: Secondary | ICD-10-CM | POA: Diagnosis not present

## 2018-02-22 DIAGNOSIS — M4802 Spinal stenosis, cervical region: Secondary | ICD-10-CM | POA: Diagnosis not present

## 2018-02-24 DIAGNOSIS — G894 Chronic pain syndrome: Secondary | ICD-10-CM | POA: Diagnosis not present

## 2018-02-24 DIAGNOSIS — M5412 Radiculopathy, cervical region: Secondary | ICD-10-CM | POA: Diagnosis not present

## 2018-02-24 DIAGNOSIS — M4802 Spinal stenosis, cervical region: Secondary | ICD-10-CM | POA: Diagnosis not present

## 2018-02-24 DIAGNOSIS — Q761 Klippel-Feil syndrome: Secondary | ICD-10-CM | POA: Diagnosis not present

## 2018-02-24 DIAGNOSIS — M47816 Spondylosis without myelopathy or radiculopathy, lumbar region: Secondary | ICD-10-CM | POA: Diagnosis not present

## 2018-02-24 DIAGNOSIS — M5136 Other intervertebral disc degeneration, lumbar region: Secondary | ICD-10-CM | POA: Diagnosis not present

## 2018-02-24 DIAGNOSIS — F419 Anxiety disorder, unspecified: Secondary | ICD-10-CM | POA: Diagnosis not present

## 2018-02-24 DIAGNOSIS — Z79891 Long term (current) use of opiate analgesic: Secondary | ICD-10-CM | POA: Diagnosis not present

## 2018-02-24 DIAGNOSIS — F1120 Opioid dependence, uncomplicated: Secondary | ICD-10-CM | POA: Diagnosis not present

## 2018-02-26 DIAGNOSIS — M4802 Spinal stenosis, cervical region: Secondary | ICD-10-CM | POA: Diagnosis not present

## 2018-03-01 ENCOUNTER — Telehealth: Payer: Self-pay | Admitting: Nurse Practitioner

## 2018-03-01 ENCOUNTER — Other Ambulatory Visit: Payer: Self-pay | Admitting: Neurosurgery

## 2018-03-01 DIAGNOSIS — M542 Cervicalgia: Secondary | ICD-10-CM

## 2018-03-01 DIAGNOSIS — M4802 Spinal stenosis, cervical region: Secondary | ICD-10-CM | POA: Diagnosis not present

## 2018-03-01 NOTE — Telephone Encounter (Signed)
Phone call to patient to verify medication list and allergies for myelogram procedure. Pt instructed to hold elavil, wellbutrin, buspar, zyprexa and phenergan 48hrs prior to myelogram appointment time. Pt verbalized understanding.

## 2018-03-03 DIAGNOSIS — M4802 Spinal stenosis, cervical region: Secondary | ICD-10-CM | POA: Diagnosis not present

## 2018-03-05 DIAGNOSIS — M4802 Spinal stenosis, cervical region: Secondary | ICD-10-CM | POA: Diagnosis not present

## 2018-03-08 DIAGNOSIS — M4802 Spinal stenosis, cervical region: Secondary | ICD-10-CM | POA: Diagnosis not present

## 2018-03-10 DIAGNOSIS — I10 Essential (primary) hypertension: Secondary | ICD-10-CM | POA: Diagnosis not present

## 2018-03-10 DIAGNOSIS — E038 Other specified hypothyroidism: Secondary | ICD-10-CM | POA: Diagnosis not present

## 2018-03-10 DIAGNOSIS — G4709 Other insomnia: Secondary | ICD-10-CM | POA: Diagnosis not present

## 2018-03-10 DIAGNOSIS — M4802 Spinal stenosis, cervical region: Secondary | ICD-10-CM | POA: Diagnosis not present

## 2018-03-10 DIAGNOSIS — E559 Vitamin D deficiency, unspecified: Secondary | ICD-10-CM | POA: Diagnosis not present

## 2018-03-12 DIAGNOSIS — M4802 Spinal stenosis, cervical region: Secondary | ICD-10-CM | POA: Diagnosis not present

## 2018-03-15 ENCOUNTER — Ambulatory Visit
Admission: RE | Admit: 2018-03-15 | Discharge: 2018-03-15 | Disposition: A | Discharge status: Home / Self Care | Payer: 59 | Source: Ambulatory Visit | Attending: Neurosurgery | Admitting: Neurosurgery

## 2018-03-15 VITALS — BP 124/78 | HR 74

## 2018-03-15 DIAGNOSIS — M50223 Other cervical disc displacement at C6-C7 level: Secondary | ICD-10-CM | POA: Diagnosis not present

## 2018-03-15 DIAGNOSIS — M4802 Spinal stenosis, cervical region: Secondary | ICD-10-CM

## 2018-03-15 DIAGNOSIS — M542 Cervicalgia: Secondary | ICD-10-CM

## 2018-03-15 MED ORDER — DIAZEPAM 5 MG PO TABS
10.0000 mg | ORAL_TABLET | Freq: Once | ORAL | Status: AC
  Administered 2018-03-15: 10 mg via ORAL

## 2018-03-15 MED ORDER — IOPAMIDOL (ISOVUE-M 300) INJECTION 61%
10.0000 mL | Freq: Once | INTRAMUSCULAR | Status: AC | PRN
  Administered 2018-03-15: 10 mL via INTRATHECAL

## 2018-03-15 MED ORDER — ONDANSETRON HCL 4 MG/2ML IJ SOLN
4.0000 mg | Freq: Once | INTRAMUSCULAR | Status: AC
  Administered 2018-03-15: 4 mg via INTRAMUSCULAR

## 2018-03-15 MED ORDER — HYDROMORPHONE HCL 1 MG/ML IJ SOLN
1.0000 mg | Freq: Once | INTRAMUSCULAR | Status: AC
  Administered 2018-03-15: 1 mg via INTRAMUSCULAR

## 2018-03-15 NOTE — Discharge Instructions (Signed)
Myelogram Discharge Instructions  1. Go home and rest quietly for the next 24 hours.  It is important to lie flat for the next 24 hours.  Get up only to go to the restroom.  You may lie in the bed or on a couch on your back, your stomach, your left side or your right side.  You may have one pillow under your head.  You may have pillows between your knees while you are on your side or under your knees while you are on your back.  2. DO NOT drive today.  Recline the seat as far back as it will go, while still wearing your seat belt, on the way home.  3. You may get up to go to the bathroom as needed.  You may sit up for 10 minutes to eat.  You may resume your normal diet and medications unless otherwise indicated.  Drink lots of extra fluids today and tomorrow.  4. The incidence of headache, nausea, or vomiting is about 5% (one in 20 patients).  If you develop a headache, lie flat and drink plenty of fluids until the headache goes away.  Caffeinated beverages may be helpful.  If you develop severe nausea and vomiting or a headache that does not go away with flat bed rest, call [phone].  5. You may resume normal activities after your 24 hours of bed rest is over; however, do not exert yourself strongly or do any heavy lifting tomorrow. If when you get up you have a headache when standing, go back to bed and force fluids for another 24 hours.  6. Call your physician for a follow-up appointment.  The results of your myelogram will be sent directly to your physician by the following day.  7. If you have any questions or if complications develop after you arrive home, please call [phone].  Discharge instructions have been explained to the patient.  The patient, or the person responsible for the patient, fully understands these instructions.   YOU MAY RESTART YOUR ELAVIL, WELLBUTRIN, BUSPAR, ZYPREXA AND PHENERGAN TOMORROW 03/16/2018 AT 09:30AM.

## 2018-03-17 DIAGNOSIS — M4802 Spinal stenosis, cervical region: Secondary | ICD-10-CM | POA: Diagnosis not present

## 2018-03-19 DIAGNOSIS — M4802 Spinal stenosis, cervical region: Secondary | ICD-10-CM | POA: Diagnosis not present

## 2018-03-22 DIAGNOSIS — M4802 Spinal stenosis, cervical region: Secondary | ICD-10-CM | POA: Diagnosis not present

## 2018-03-24 DIAGNOSIS — M4802 Spinal stenosis, cervical region: Secondary | ICD-10-CM | POA: Diagnosis not present

## 2018-03-25 DIAGNOSIS — G894 Chronic pain syndrome: Secondary | ICD-10-CM | POA: Diagnosis not present

## 2018-03-25 DIAGNOSIS — F419 Anxiety disorder, unspecified: Secondary | ICD-10-CM | POA: Diagnosis not present

## 2018-03-25 DIAGNOSIS — M170 Bilateral primary osteoarthritis of knee: Secondary | ICD-10-CM | POA: Diagnosis not present

## 2018-03-25 DIAGNOSIS — M159 Polyosteoarthritis, unspecified: Secondary | ICD-10-CM | POA: Diagnosis not present

## 2018-03-25 DIAGNOSIS — M47816 Spondylosis without myelopathy or radiculopathy, lumbar region: Secondary | ICD-10-CM | POA: Diagnosis not present

## 2018-03-25 DIAGNOSIS — M5136 Other intervertebral disc degeneration, lumbar region: Secondary | ICD-10-CM | POA: Diagnosis not present

## 2018-03-25 DIAGNOSIS — Z79891 Long term (current) use of opiate analgesic: Secondary | ICD-10-CM | POA: Diagnosis not present

## 2018-03-25 DIAGNOSIS — Q761 Klippel-Feil syndrome: Secondary | ICD-10-CM | POA: Diagnosis not present

## 2018-03-25 DIAGNOSIS — F1120 Opioid dependence, uncomplicated: Secondary | ICD-10-CM | POA: Diagnosis not present

## 2018-03-26 DIAGNOSIS — M4802 Spinal stenosis, cervical region: Secondary | ICD-10-CM | POA: Diagnosis not present

## 2018-03-29 DIAGNOSIS — M4802 Spinal stenosis, cervical region: Secondary | ICD-10-CM | POA: Diagnosis not present

## 2018-03-31 DIAGNOSIS — M4802 Spinal stenosis, cervical region: Secondary | ICD-10-CM | POA: Diagnosis not present

## 2018-04-02 DIAGNOSIS — M4802 Spinal stenosis, cervical region: Secondary | ICD-10-CM | POA: Diagnosis not present

## 2018-04-05 DIAGNOSIS — M4802 Spinal stenosis, cervical region: Secondary | ICD-10-CM | POA: Diagnosis not present

## 2018-04-07 DIAGNOSIS — M4802 Spinal stenosis, cervical region: Secondary | ICD-10-CM | POA: Diagnosis not present

## 2018-04-09 DIAGNOSIS — M4802 Spinal stenosis, cervical region: Secondary | ICD-10-CM | POA: Diagnosis not present

## 2018-04-12 DIAGNOSIS — M4802 Spinal stenosis, cervical region: Secondary | ICD-10-CM | POA: Diagnosis not present

## 2018-04-14 DIAGNOSIS — M4802 Spinal stenosis, cervical region: Secondary | ICD-10-CM | POA: Diagnosis not present

## 2018-04-15 DIAGNOSIS — F411 Generalized anxiety disorder: Secondary | ICD-10-CM | POA: Diagnosis not present

## 2018-04-15 DIAGNOSIS — F3132 Bipolar disorder, current episode depressed, moderate: Secondary | ICD-10-CM | POA: Diagnosis not present

## 2018-04-16 DIAGNOSIS — M4802 Spinal stenosis, cervical region: Secondary | ICD-10-CM | POA: Diagnosis not present

## 2018-04-19 DIAGNOSIS — M4802 Spinal stenosis, cervical region: Secondary | ICD-10-CM | POA: Diagnosis not present

## 2018-04-21 DIAGNOSIS — M4802 Spinal stenosis, cervical region: Secondary | ICD-10-CM | POA: Diagnosis not present

## 2018-04-23 DIAGNOSIS — R200 Anesthesia of skin: Secondary | ICD-10-CM | POA: Diagnosis not present

## 2018-04-23 DIAGNOSIS — M4802 Spinal stenosis, cervical region: Secondary | ICD-10-CM | POA: Diagnosis not present

## 2018-04-26 DIAGNOSIS — G894 Chronic pain syndrome: Secondary | ICD-10-CM | POA: Diagnosis not present

## 2018-04-26 DIAGNOSIS — M47816 Spondylosis without myelopathy or radiculopathy, lumbar region: Secondary | ICD-10-CM | POA: Diagnosis not present

## 2018-04-26 DIAGNOSIS — Q761 Klippel-Feil syndrome: Secondary | ICD-10-CM | POA: Diagnosis not present

## 2018-04-26 DIAGNOSIS — Z79891 Long term (current) use of opiate analgesic: Secondary | ICD-10-CM | POA: Diagnosis not present

## 2018-04-26 DIAGNOSIS — F1120 Opioid dependence, uncomplicated: Secondary | ICD-10-CM | POA: Diagnosis not present

## 2018-04-26 DIAGNOSIS — F419 Anxiety disorder, unspecified: Secondary | ICD-10-CM | POA: Diagnosis not present

## 2018-04-26 DIAGNOSIS — M159 Polyosteoarthritis, unspecified: Secondary | ICD-10-CM | POA: Diagnosis not present

## 2018-04-26 DIAGNOSIS — M4802 Spinal stenosis, cervical region: Secondary | ICD-10-CM | POA: Diagnosis not present

## 2018-04-26 DIAGNOSIS — M170 Bilateral primary osteoarthritis of knee: Secondary | ICD-10-CM | POA: Diagnosis not present

## 2018-04-26 DIAGNOSIS — M5136 Other intervertebral disc degeneration, lumbar region: Secondary | ICD-10-CM | POA: Diagnosis not present

## 2018-04-28 DIAGNOSIS — M4802 Spinal stenosis, cervical region: Secondary | ICD-10-CM | POA: Diagnosis not present

## 2018-04-30 DIAGNOSIS — M4802 Spinal stenosis, cervical region: Secondary | ICD-10-CM | POA: Diagnosis not present

## 2018-05-10 DIAGNOSIS — F17210 Nicotine dependence, cigarettes, uncomplicated: Secondary | ICD-10-CM | POA: Diagnosis not present

## 2018-05-10 DIAGNOSIS — R109 Unspecified abdominal pain: Secondary | ICD-10-CM | POA: Diagnosis not present

## 2018-05-10 DIAGNOSIS — M545 Low back pain: Secondary | ICD-10-CM | POA: Diagnosis not present

## 2018-05-12 DIAGNOSIS — M4802 Spinal stenosis, cervical region: Secondary | ICD-10-CM | POA: Diagnosis not present

## 2018-05-14 DIAGNOSIS — M4802 Spinal stenosis, cervical region: Secondary | ICD-10-CM | POA: Diagnosis not present

## 2018-05-19 DIAGNOSIS — M4802 Spinal stenosis, cervical region: Secondary | ICD-10-CM | POA: Diagnosis not present

## 2018-05-21 DIAGNOSIS — M4802 Spinal stenosis, cervical region: Secondary | ICD-10-CM | POA: Diagnosis not present

## 2018-05-24 DIAGNOSIS — M4802 Spinal stenosis, cervical region: Secondary | ICD-10-CM | POA: Diagnosis not present

## 2018-05-28 DIAGNOSIS — M4802 Spinal stenosis, cervical region: Secondary | ICD-10-CM | POA: Diagnosis not present

## 2018-05-31 DIAGNOSIS — M4802 Spinal stenosis, cervical region: Secondary | ICD-10-CM | POA: Diagnosis not present

## 2018-06-02 DIAGNOSIS — M4802 Spinal stenosis, cervical region: Secondary | ICD-10-CM | POA: Diagnosis not present

## 2018-06-04 DIAGNOSIS — F419 Anxiety disorder, unspecified: Secondary | ICD-10-CM | POA: Diagnosis not present

## 2018-06-04 DIAGNOSIS — M47816 Spondylosis without myelopathy or radiculopathy, lumbar region: Secondary | ICD-10-CM | POA: Diagnosis not present

## 2018-06-04 DIAGNOSIS — M170 Bilateral primary osteoarthritis of knee: Secondary | ICD-10-CM | POA: Diagnosis not present

## 2018-06-04 DIAGNOSIS — Q761 Klippel-Feil syndrome: Secondary | ICD-10-CM | POA: Diagnosis not present

## 2018-06-04 DIAGNOSIS — M5136 Other intervertebral disc degeneration, lumbar region: Secondary | ICD-10-CM | POA: Diagnosis not present

## 2018-06-04 DIAGNOSIS — F1120 Opioid dependence, uncomplicated: Secondary | ICD-10-CM | POA: Diagnosis not present

## 2018-06-04 DIAGNOSIS — M159 Polyosteoarthritis, unspecified: Secondary | ICD-10-CM | POA: Diagnosis not present

## 2018-06-04 DIAGNOSIS — G894 Chronic pain syndrome: Secondary | ICD-10-CM | POA: Diagnosis not present

## 2018-06-04 DIAGNOSIS — Z79891 Long term (current) use of opiate analgesic: Secondary | ICD-10-CM | POA: Diagnosis not present

## 2018-06-04 DIAGNOSIS — M4802 Spinal stenosis, cervical region: Secondary | ICD-10-CM | POA: Diagnosis not present

## 2018-06-07 DIAGNOSIS — M4802 Spinal stenosis, cervical region: Secondary | ICD-10-CM | POA: Diagnosis not present

## 2018-06-09 DIAGNOSIS — M4802 Spinal stenosis, cervical region: Secondary | ICD-10-CM | POA: Diagnosis not present

## 2018-06-10 DIAGNOSIS — F411 Generalized anxiety disorder: Secondary | ICD-10-CM | POA: Diagnosis not present

## 2018-06-10 DIAGNOSIS — F3132 Bipolar disorder, current episode depressed, moderate: Secondary | ICD-10-CM | POA: Diagnosis not present

## 2018-06-11 DIAGNOSIS — M4802 Spinal stenosis, cervical region: Secondary | ICD-10-CM | POA: Diagnosis not present

## 2018-06-14 DIAGNOSIS — M4802 Spinal stenosis, cervical region: Secondary | ICD-10-CM | POA: Diagnosis not present

## 2018-06-16 DIAGNOSIS — M4802 Spinal stenosis, cervical region: Secondary | ICD-10-CM | POA: Diagnosis not present

## 2018-06-16 DIAGNOSIS — R200 Anesthesia of skin: Secondary | ICD-10-CM | POA: Diagnosis not present

## 2018-06-18 DIAGNOSIS — M4802 Spinal stenosis, cervical region: Secondary | ICD-10-CM | POA: Diagnosis not present

## 2018-06-21 DIAGNOSIS — M4802 Spinal stenosis, cervical region: Secondary | ICD-10-CM | POA: Diagnosis not present

## 2018-06-23 DIAGNOSIS — M4802 Spinal stenosis, cervical region: Secondary | ICD-10-CM | POA: Diagnosis not present

## 2018-06-25 DIAGNOSIS — M4802 Spinal stenosis, cervical region: Secondary | ICD-10-CM | POA: Diagnosis not present

## 2018-06-28 DIAGNOSIS — M4802 Spinal stenosis, cervical region: Secondary | ICD-10-CM | POA: Diagnosis not present

## 2018-06-30 DIAGNOSIS — M4802 Spinal stenosis, cervical region: Secondary | ICD-10-CM | POA: Diagnosis not present

## 2018-07-02 DIAGNOSIS — M4802 Spinal stenosis, cervical region: Secondary | ICD-10-CM | POA: Diagnosis not present

## 2018-07-05 DIAGNOSIS — M4802 Spinal stenosis, cervical region: Secondary | ICD-10-CM | POA: Diagnosis not present

## 2018-07-07 DIAGNOSIS — M159 Polyosteoarthritis, unspecified: Secondary | ICD-10-CM | POA: Diagnosis not present

## 2018-07-07 DIAGNOSIS — M47816 Spondylosis without myelopathy or radiculopathy, lumbar region: Secondary | ICD-10-CM | POA: Diagnosis not present

## 2018-07-07 DIAGNOSIS — F1120 Opioid dependence, uncomplicated: Secondary | ICD-10-CM | POA: Diagnosis not present

## 2018-07-07 DIAGNOSIS — Q761 Klippel-Feil syndrome: Secondary | ICD-10-CM | POA: Diagnosis not present

## 2018-07-07 DIAGNOSIS — M5136 Other intervertebral disc degeneration, lumbar region: Secondary | ICD-10-CM | POA: Diagnosis not present

## 2018-07-07 DIAGNOSIS — G894 Chronic pain syndrome: Secondary | ICD-10-CM | POA: Diagnosis not present

## 2018-07-07 DIAGNOSIS — F419 Anxiety disorder, unspecified: Secondary | ICD-10-CM | POA: Diagnosis not present

## 2018-07-07 DIAGNOSIS — M4802 Spinal stenosis, cervical region: Secondary | ICD-10-CM | POA: Diagnosis not present

## 2018-07-07 DIAGNOSIS — Z79891 Long term (current) use of opiate analgesic: Secondary | ICD-10-CM | POA: Diagnosis not present

## 2018-07-07 DIAGNOSIS — M170 Bilateral primary osteoarthritis of knee: Secondary | ICD-10-CM | POA: Diagnosis not present

## 2018-07-09 DIAGNOSIS — M4802 Spinal stenosis, cervical region: Secondary | ICD-10-CM | POA: Diagnosis not present

## 2018-07-12 DIAGNOSIS — M4802 Spinal stenosis, cervical region: Secondary | ICD-10-CM | POA: Diagnosis not present

## 2018-07-13 DIAGNOSIS — E785 Hyperlipidemia, unspecified: Secondary | ICD-10-CM | POA: Diagnosis not present

## 2018-07-13 DIAGNOSIS — R030 Elevated blood-pressure reading, without diagnosis of hypertension: Secondary | ICD-10-CM | POA: Diagnosis not present

## 2018-07-13 DIAGNOSIS — R259 Unspecified abnormal involuntary movements: Secondary | ICD-10-CM | POA: Diagnosis not present

## 2018-07-13 DIAGNOSIS — F319 Bipolar disorder, unspecified: Secondary | ICD-10-CM | POA: Diagnosis not present

## 2018-07-13 DIAGNOSIS — Z125 Encounter for screening for malignant neoplasm of prostate: Secondary | ICD-10-CM | POA: Diagnosis not present

## 2018-07-13 DIAGNOSIS — E039 Hypothyroidism, unspecified: Secondary | ICD-10-CM | POA: Diagnosis not present

## 2018-07-13 DIAGNOSIS — E559 Vitamin D deficiency, unspecified: Secondary | ICD-10-CM | POA: Diagnosis not present

## 2018-07-14 DIAGNOSIS — M4802 Spinal stenosis, cervical region: Secondary | ICD-10-CM | POA: Diagnosis not present

## 2018-07-16 DIAGNOSIS — M4802 Spinal stenosis, cervical region: Secondary | ICD-10-CM | POA: Diagnosis not present

## 2018-07-19 DIAGNOSIS — M4802 Spinal stenosis, cervical region: Secondary | ICD-10-CM | POA: Diagnosis not present

## 2018-07-20 DIAGNOSIS — E039 Hypothyroidism, unspecified: Secondary | ICD-10-CM | POA: Diagnosis not present

## 2018-07-20 DIAGNOSIS — E785 Hyperlipidemia, unspecified: Secondary | ICD-10-CM | POA: Diagnosis not present

## 2018-07-20 DIAGNOSIS — E559 Vitamin D deficiency, unspecified: Secondary | ICD-10-CM | POA: Diagnosis not present

## 2018-07-20 DIAGNOSIS — F319 Bipolar disorder, unspecified: Secondary | ICD-10-CM | POA: Diagnosis not present

## 2018-07-21 DIAGNOSIS — M4802 Spinal stenosis, cervical region: Secondary | ICD-10-CM | POA: Diagnosis not present

## 2018-07-23 DIAGNOSIS — M4802 Spinal stenosis, cervical region: Secondary | ICD-10-CM | POA: Diagnosis not present

## 2018-07-26 DIAGNOSIS — M4802 Spinal stenosis, cervical region: Secondary | ICD-10-CM | POA: Diagnosis not present

## 2018-07-28 DIAGNOSIS — M4802 Spinal stenosis, cervical region: Secondary | ICD-10-CM | POA: Diagnosis not present

## 2018-07-30 DIAGNOSIS — M4802 Spinal stenosis, cervical region: Secondary | ICD-10-CM | POA: Diagnosis not present

## 2018-08-02 DIAGNOSIS — M4802 Spinal stenosis, cervical region: Secondary | ICD-10-CM | POA: Diagnosis not present

## 2018-08-04 DIAGNOSIS — F3132 Bipolar disorder, current episode depressed, moderate: Secondary | ICD-10-CM | POA: Diagnosis not present

## 2018-08-04 DIAGNOSIS — M4802 Spinal stenosis, cervical region: Secondary | ICD-10-CM | POA: Diagnosis not present

## 2018-08-04 DIAGNOSIS — F411 Generalized anxiety disorder: Secondary | ICD-10-CM | POA: Diagnosis not present

## 2018-08-05 DIAGNOSIS — Z79891 Long term (current) use of opiate analgesic: Secondary | ICD-10-CM | POA: Diagnosis not present

## 2018-08-05 DIAGNOSIS — M5136 Other intervertebral disc degeneration, lumbar region: Secondary | ICD-10-CM | POA: Diagnosis not present

## 2018-08-05 DIAGNOSIS — F419 Anxiety disorder, unspecified: Secondary | ICD-10-CM | POA: Diagnosis not present

## 2018-08-05 DIAGNOSIS — M47816 Spondylosis without myelopathy or radiculopathy, lumbar region: Secondary | ICD-10-CM | POA: Diagnosis not present

## 2018-08-05 DIAGNOSIS — M170 Bilateral primary osteoarthritis of knee: Secondary | ICD-10-CM | POA: Diagnosis not present

## 2018-08-05 DIAGNOSIS — F1120 Opioid dependence, uncomplicated: Secondary | ICD-10-CM | POA: Diagnosis not present

## 2018-08-05 DIAGNOSIS — M159 Polyosteoarthritis, unspecified: Secondary | ICD-10-CM | POA: Diagnosis not present

## 2018-08-05 DIAGNOSIS — Q761 Klippel-Feil syndrome: Secondary | ICD-10-CM | POA: Diagnosis not present

## 2018-08-05 DIAGNOSIS — G894 Chronic pain syndrome: Secondary | ICD-10-CM | POA: Diagnosis not present

## 2018-08-06 DIAGNOSIS — M4802 Spinal stenosis, cervical region: Secondary | ICD-10-CM | POA: Diagnosis not present

## 2018-08-09 DIAGNOSIS — M4802 Spinal stenosis, cervical region: Secondary | ICD-10-CM | POA: Diagnosis not present

## 2018-08-11 DIAGNOSIS — M4802 Spinal stenosis, cervical region: Secondary | ICD-10-CM | POA: Diagnosis not present

## 2018-08-16 DIAGNOSIS — M4802 Spinal stenosis, cervical region: Secondary | ICD-10-CM | POA: Diagnosis not present

## 2018-08-18 DIAGNOSIS — M4802 Spinal stenosis, cervical region: Secondary | ICD-10-CM | POA: Diagnosis not present

## 2018-08-20 DIAGNOSIS — M4802 Spinal stenosis, cervical region: Secondary | ICD-10-CM | POA: Diagnosis not present

## 2018-08-23 DIAGNOSIS — M4802 Spinal stenosis, cervical region: Secondary | ICD-10-CM | POA: Diagnosis not present

## 2018-08-25 DIAGNOSIS — M4802 Spinal stenosis, cervical region: Secondary | ICD-10-CM | POA: Diagnosis not present

## 2018-08-27 DIAGNOSIS — M4802 Spinal stenosis, cervical region: Secondary | ICD-10-CM | POA: Diagnosis not present

## 2018-08-30 DIAGNOSIS — M4802 Spinal stenosis, cervical region: Secondary | ICD-10-CM | POA: Diagnosis not present

## 2018-09-01 DIAGNOSIS — M4802 Spinal stenosis, cervical region: Secondary | ICD-10-CM | POA: Diagnosis not present

## 2018-09-03 DIAGNOSIS — M4802 Spinal stenosis, cervical region: Secondary | ICD-10-CM | POA: Diagnosis not present

## 2018-09-06 DIAGNOSIS — M4802 Spinal stenosis, cervical region: Secondary | ICD-10-CM | POA: Diagnosis not present

## 2018-09-08 DIAGNOSIS — M4802 Spinal stenosis, cervical region: Secondary | ICD-10-CM | POA: Diagnosis not present

## 2018-09-10 DIAGNOSIS — M4802 Spinal stenosis, cervical region: Secondary | ICD-10-CM | POA: Diagnosis not present

## 2018-09-13 DIAGNOSIS — M4802 Spinal stenosis, cervical region: Secondary | ICD-10-CM | POA: Diagnosis not present

## 2018-09-14 DIAGNOSIS — Q761 Klippel-Feil syndrome: Secondary | ICD-10-CM | POA: Diagnosis not present

## 2018-09-14 DIAGNOSIS — M159 Polyosteoarthritis, unspecified: Secondary | ICD-10-CM | POA: Diagnosis not present

## 2018-09-14 DIAGNOSIS — G894 Chronic pain syndrome: Secondary | ICD-10-CM | POA: Diagnosis not present

## 2018-09-14 DIAGNOSIS — M170 Bilateral primary osteoarthritis of knee: Secondary | ICD-10-CM | POA: Diagnosis not present

## 2018-09-14 DIAGNOSIS — F1120 Opioid dependence, uncomplicated: Secondary | ICD-10-CM | POA: Diagnosis not present

## 2018-09-14 DIAGNOSIS — Z79891 Long term (current) use of opiate analgesic: Secondary | ICD-10-CM | POA: Diagnosis not present

## 2018-09-14 DIAGNOSIS — M5136 Other intervertebral disc degeneration, lumbar region: Secondary | ICD-10-CM | POA: Diagnosis not present

## 2018-09-14 DIAGNOSIS — M47816 Spondylosis without myelopathy or radiculopathy, lumbar region: Secondary | ICD-10-CM | POA: Diagnosis not present

## 2018-09-14 DIAGNOSIS — F419 Anxiety disorder, unspecified: Secondary | ICD-10-CM | POA: Diagnosis not present

## 2018-09-15 DIAGNOSIS — M4802 Spinal stenosis, cervical region: Secondary | ICD-10-CM | POA: Diagnosis not present

## 2018-09-17 DIAGNOSIS — M4802 Spinal stenosis, cervical region: Secondary | ICD-10-CM | POA: Diagnosis not present

## 2018-09-20 DIAGNOSIS — M4802 Spinal stenosis, cervical region: Secondary | ICD-10-CM | POA: Diagnosis not present

## 2018-09-22 DIAGNOSIS — M4802 Spinal stenosis, cervical region: Secondary | ICD-10-CM | POA: Diagnosis not present

## 2018-09-24 DIAGNOSIS — M4802 Spinal stenosis, cervical region: Secondary | ICD-10-CM | POA: Diagnosis not present

## 2018-09-27 DIAGNOSIS — M4802 Spinal stenosis, cervical region: Secondary | ICD-10-CM | POA: Diagnosis not present

## 2018-10-04 DIAGNOSIS — M4802 Spinal stenosis, cervical region: Secondary | ICD-10-CM | POA: Diagnosis not present

## 2018-10-06 DIAGNOSIS — M4802 Spinal stenosis, cervical region: Secondary | ICD-10-CM | POA: Diagnosis not present

## 2018-10-08 DIAGNOSIS — M4802 Spinal stenosis, cervical region: Secondary | ICD-10-CM | POA: Diagnosis not present

## 2018-10-11 DIAGNOSIS — M4802 Spinal stenosis, cervical region: Secondary | ICD-10-CM | POA: Diagnosis not present

## 2018-10-11 DIAGNOSIS — Z20828 Contact with and (suspected) exposure to other viral communicable diseases: Secondary | ICD-10-CM | POA: Diagnosis not present

## 2018-10-11 DIAGNOSIS — M791 Myalgia, unspecified site: Secondary | ICD-10-CM | POA: Diagnosis not present

## 2018-10-13 DIAGNOSIS — M4802 Spinal stenosis, cervical region: Secondary | ICD-10-CM | POA: Diagnosis not present

## 2018-10-15 DIAGNOSIS — M4802 Spinal stenosis, cervical region: Secondary | ICD-10-CM | POA: Diagnosis not present

## 2018-10-18 DIAGNOSIS — M4802 Spinal stenosis, cervical region: Secondary | ICD-10-CM | POA: Diagnosis not present

## 2018-10-20 DIAGNOSIS — M4802 Spinal stenosis, cervical region: Secondary | ICD-10-CM | POA: Diagnosis not present

## 2018-10-22 DIAGNOSIS — M4802 Spinal stenosis, cervical region: Secondary | ICD-10-CM | POA: Diagnosis not present

## 2018-10-25 DIAGNOSIS — R000 Tachycardia, unspecified: Secondary | ICD-10-CM | POA: Diagnosis not present

## 2018-10-25 DIAGNOSIS — M4802 Spinal stenosis, cervical region: Secondary | ICD-10-CM | POA: Diagnosis not present

## 2018-10-25 DIAGNOSIS — S7001XA Contusion of right hip, initial encounter: Secondary | ICD-10-CM | POA: Diagnosis not present

## 2018-10-25 DIAGNOSIS — S39012A Strain of muscle, fascia and tendon of lower back, initial encounter: Secondary | ICD-10-CM | POA: Diagnosis not present

## 2018-10-25 DIAGNOSIS — S299XXA Unspecified injury of thorax, initial encounter: Secondary | ICD-10-CM | POA: Diagnosis not present

## 2018-10-25 DIAGNOSIS — R079 Chest pain, unspecified: Secondary | ICD-10-CM | POA: Diagnosis not present

## 2018-10-25 DIAGNOSIS — M545 Low back pain: Secondary | ICD-10-CM | POA: Diagnosis not present

## 2018-10-25 DIAGNOSIS — M25551 Pain in right hip: Secondary | ICD-10-CM | POA: Diagnosis not present

## 2018-10-25 DIAGNOSIS — M5489 Other dorsalgia: Secondary | ICD-10-CM | POA: Diagnosis not present

## 2018-10-27 DIAGNOSIS — M4802 Spinal stenosis, cervical region: Secondary | ICD-10-CM | POA: Diagnosis not present

## 2018-10-27 DIAGNOSIS — F3132 Bipolar disorder, current episode depressed, moderate: Secondary | ICD-10-CM | POA: Diagnosis not present

## 2018-10-27 DIAGNOSIS — F411 Generalized anxiety disorder: Secondary | ICD-10-CM | POA: Diagnosis not present

## 2018-10-28 DIAGNOSIS — M159 Polyosteoarthritis, unspecified: Secondary | ICD-10-CM | POA: Diagnosis not present

## 2018-10-28 DIAGNOSIS — M5136 Other intervertebral disc degeneration, lumbar region: Secondary | ICD-10-CM | POA: Diagnosis not present

## 2018-10-28 DIAGNOSIS — F419 Anxiety disorder, unspecified: Secondary | ICD-10-CM | POA: Diagnosis not present

## 2018-10-28 DIAGNOSIS — M170 Bilateral primary osteoarthritis of knee: Secondary | ICD-10-CM | POA: Diagnosis not present

## 2018-10-28 DIAGNOSIS — G894 Chronic pain syndrome: Secondary | ICD-10-CM | POA: Diagnosis not present

## 2018-10-28 DIAGNOSIS — Z79891 Long term (current) use of opiate analgesic: Secondary | ICD-10-CM | POA: Diagnosis not present

## 2018-10-28 DIAGNOSIS — F1120 Opioid dependence, uncomplicated: Secondary | ICD-10-CM | POA: Diagnosis not present

## 2018-10-28 DIAGNOSIS — Q761 Klippel-Feil syndrome: Secondary | ICD-10-CM | POA: Diagnosis not present

## 2018-10-28 DIAGNOSIS — M47816 Spondylosis without myelopathy or radiculopathy, lumbar region: Secondary | ICD-10-CM | POA: Diagnosis not present

## 2018-10-29 DIAGNOSIS — M4802 Spinal stenosis, cervical region: Secondary | ICD-10-CM | POA: Diagnosis not present

## 2018-11-01 DIAGNOSIS — M4802 Spinal stenosis, cervical region: Secondary | ICD-10-CM | POA: Diagnosis not present

## 2018-11-03 DIAGNOSIS — M4802 Spinal stenosis, cervical region: Secondary | ICD-10-CM | POA: Diagnosis not present

## 2018-11-05 DIAGNOSIS — M4802 Spinal stenosis, cervical region: Secondary | ICD-10-CM | POA: Diagnosis not present

## 2018-11-08 DIAGNOSIS — M4802 Spinal stenosis, cervical region: Secondary | ICD-10-CM | POA: Diagnosis not present

## 2018-11-10 DIAGNOSIS — M4802 Spinal stenosis, cervical region: Secondary | ICD-10-CM | POA: Diagnosis not present

## 2018-11-12 DIAGNOSIS — M4802 Spinal stenosis, cervical region: Secondary | ICD-10-CM | POA: Diagnosis not present

## 2018-11-15 DIAGNOSIS — M4802 Spinal stenosis, cervical region: Secondary | ICD-10-CM | POA: Diagnosis not present

## 2018-11-17 DIAGNOSIS — M4802 Spinal stenosis, cervical region: Secondary | ICD-10-CM | POA: Diagnosis not present

## 2018-11-19 DIAGNOSIS — M4802 Spinal stenosis, cervical region: Secondary | ICD-10-CM | POA: Diagnosis not present

## 2018-11-22 DIAGNOSIS — M4802 Spinal stenosis, cervical region: Secondary | ICD-10-CM | POA: Diagnosis not present

## 2018-11-24 DIAGNOSIS — F1120 Opioid dependence, uncomplicated: Secondary | ICD-10-CM | POA: Diagnosis not present

## 2018-11-24 DIAGNOSIS — M25559 Pain in unspecified hip: Secondary | ICD-10-CM | POA: Diagnosis not present

## 2018-11-24 DIAGNOSIS — M159 Polyosteoarthritis, unspecified: Secondary | ICD-10-CM | POA: Diagnosis not present

## 2018-11-24 DIAGNOSIS — M47816 Spondylosis without myelopathy or radiculopathy, lumbar region: Secondary | ICD-10-CM | POA: Diagnosis not present

## 2018-11-24 DIAGNOSIS — M170 Bilateral primary osteoarthritis of knee: Secondary | ICD-10-CM | POA: Diagnosis not present

## 2018-11-24 DIAGNOSIS — F419 Anxiety disorder, unspecified: Secondary | ICD-10-CM | POA: Diagnosis not present

## 2018-11-24 DIAGNOSIS — M87051 Idiopathic aseptic necrosis of right femur: Secondary | ICD-10-CM | POA: Diagnosis not present

## 2018-11-24 DIAGNOSIS — M4802 Spinal stenosis, cervical region: Secondary | ICD-10-CM | POA: Diagnosis not present

## 2018-11-24 DIAGNOSIS — M5136 Other intervertebral disc degeneration, lumbar region: Secondary | ICD-10-CM | POA: Diagnosis not present

## 2018-11-24 DIAGNOSIS — Z79891 Long term (current) use of opiate analgesic: Secondary | ICD-10-CM | POA: Diagnosis not present

## 2018-11-24 DIAGNOSIS — Z6830 Body mass index (BMI) 30.0-30.9, adult: Secondary | ICD-10-CM | POA: Diagnosis not present

## 2018-11-24 DIAGNOSIS — Q761 Klippel-Feil syndrome: Secondary | ICD-10-CM | POA: Diagnosis not present

## 2018-11-24 DIAGNOSIS — G894 Chronic pain syndrome: Secondary | ICD-10-CM | POA: Diagnosis not present

## 2018-11-24 DIAGNOSIS — M87052 Idiopathic aseptic necrosis of left femur: Secondary | ICD-10-CM | POA: Diagnosis not present

## 2018-11-26 DIAGNOSIS — M4802 Spinal stenosis, cervical region: Secondary | ICD-10-CM | POA: Diagnosis not present

## 2018-11-29 DIAGNOSIS — M4802 Spinal stenosis, cervical region: Secondary | ICD-10-CM | POA: Diagnosis not present

## 2018-12-01 DIAGNOSIS — M4802 Spinal stenosis, cervical region: Secondary | ICD-10-CM | POA: Diagnosis not present

## 2018-12-03 DIAGNOSIS — M4802 Spinal stenosis, cervical region: Secondary | ICD-10-CM | POA: Diagnosis not present

## 2018-12-06 DIAGNOSIS — M4802 Spinal stenosis, cervical region: Secondary | ICD-10-CM | POA: Diagnosis not present

## 2018-12-08 DIAGNOSIS — M4802 Spinal stenosis, cervical region: Secondary | ICD-10-CM | POA: Diagnosis not present

## 2018-12-10 DIAGNOSIS — M4802 Spinal stenosis, cervical region: Secondary | ICD-10-CM | POA: Diagnosis not present

## 2018-12-13 DIAGNOSIS — M4802 Spinal stenosis, cervical region: Secondary | ICD-10-CM | POA: Diagnosis not present

## 2018-12-15 DIAGNOSIS — M4802 Spinal stenosis, cervical region: Secondary | ICD-10-CM | POA: Diagnosis not present

## 2018-12-17 DIAGNOSIS — M4802 Spinal stenosis, cervical region: Secondary | ICD-10-CM | POA: Diagnosis not present

## 2018-12-20 DIAGNOSIS — M4802 Spinal stenosis, cervical region: Secondary | ICD-10-CM | POA: Diagnosis not present

## 2018-12-22 DIAGNOSIS — M4802 Spinal stenosis, cervical region: Secondary | ICD-10-CM | POA: Diagnosis not present

## 2018-12-22 DIAGNOSIS — M25551 Pain in right hip: Secondary | ICD-10-CM | POA: Diagnosis not present

## 2018-12-24 DIAGNOSIS — M4802 Spinal stenosis, cervical region: Secondary | ICD-10-CM | POA: Diagnosis not present

## 2018-12-27 DIAGNOSIS — M4802 Spinal stenosis, cervical region: Secondary | ICD-10-CM | POA: Diagnosis not present

## 2018-12-29 DIAGNOSIS — M4802 Spinal stenosis, cervical region: Secondary | ICD-10-CM | POA: Diagnosis not present

## 2018-12-31 DIAGNOSIS — M4802 Spinal stenosis, cervical region: Secondary | ICD-10-CM | POA: Diagnosis not present

## 2019-01-03 DIAGNOSIS — M4802 Spinal stenosis, cervical region: Secondary | ICD-10-CM | POA: Diagnosis not present

## 2019-01-05 DIAGNOSIS — M4802 Spinal stenosis, cervical region: Secondary | ICD-10-CM | POA: Diagnosis not present

## 2019-01-07 DIAGNOSIS — M4802 Spinal stenosis, cervical region: Secondary | ICD-10-CM | POA: Diagnosis not present

## 2019-01-08 DIAGNOSIS — Z20828 Contact with and (suspected) exposure to other viral communicable diseases: Secondary | ICD-10-CM | POA: Diagnosis not present

## 2019-01-10 DIAGNOSIS — M4802 Spinal stenosis, cervical region: Secondary | ICD-10-CM | POA: Diagnosis not present

## 2019-01-11 DIAGNOSIS — E785 Hyperlipidemia, unspecified: Secondary | ICD-10-CM | POA: Diagnosis not present

## 2019-01-11 DIAGNOSIS — E039 Hypothyroidism, unspecified: Secondary | ICD-10-CM | POA: Diagnosis not present

## 2019-01-12 DIAGNOSIS — M4802 Spinal stenosis, cervical region: Secondary | ICD-10-CM | POA: Diagnosis not present

## 2019-01-14 DIAGNOSIS — M4802 Spinal stenosis, cervical region: Secondary | ICD-10-CM | POA: Diagnosis not present

## 2019-01-18 DIAGNOSIS — F3132 Bipolar disorder, current episode depressed, moderate: Secondary | ICD-10-CM | POA: Diagnosis not present

## 2019-01-18 DIAGNOSIS — F411 Generalized anxiety disorder: Secondary | ICD-10-CM | POA: Diagnosis not present

## 2019-01-24 DIAGNOSIS — M4802 Spinal stenosis, cervical region: Secondary | ICD-10-CM | POA: Diagnosis not present

## 2019-01-25 DIAGNOSIS — F319 Bipolar disorder, unspecified: Secondary | ICD-10-CM | POA: Diagnosis not present

## 2019-01-25 DIAGNOSIS — R7309 Other abnormal glucose: Secondary | ICD-10-CM | POA: Diagnosis not present

## 2019-01-25 DIAGNOSIS — E785 Hyperlipidemia, unspecified: Secondary | ICD-10-CM | POA: Diagnosis not present

## 2019-01-25 DIAGNOSIS — R748 Abnormal levels of other serum enzymes: Secondary | ICD-10-CM | POA: Diagnosis not present

## 2019-01-26 DIAGNOSIS — M4802 Spinal stenosis, cervical region: Secondary | ICD-10-CM | POA: Diagnosis not present

## 2019-01-28 DIAGNOSIS — M4802 Spinal stenosis, cervical region: Secondary | ICD-10-CM | POA: Diagnosis not present

## 2019-01-31 DIAGNOSIS — M4802 Spinal stenosis, cervical region: Secondary | ICD-10-CM | POA: Diagnosis not present

## 2019-02-02 DIAGNOSIS — M4802 Spinal stenosis, cervical region: Secondary | ICD-10-CM | POA: Diagnosis not present

## 2019-02-02 DIAGNOSIS — K219 Gastro-esophageal reflux disease without esophagitis: Secondary | ICD-10-CM | POA: Diagnosis not present

## 2019-02-02 DIAGNOSIS — E559 Vitamin D deficiency, unspecified: Secondary | ICD-10-CM | POA: Diagnosis not present

## 2019-02-02 DIAGNOSIS — M87859 Other osteonecrosis, unspecified femur: Secondary | ICD-10-CM | POA: Diagnosis not present

## 2019-02-02 DIAGNOSIS — G250 Essential tremor: Secondary | ICD-10-CM | POA: Diagnosis not present

## 2019-02-02 DIAGNOSIS — E039 Hypothyroidism, unspecified: Secondary | ICD-10-CM | POA: Diagnosis not present

## 2019-02-02 DIAGNOSIS — F319 Bipolar disorder, unspecified: Secondary | ICD-10-CM | POA: Diagnosis not present

## 2019-02-02 DIAGNOSIS — R739 Hyperglycemia, unspecified: Secondary | ICD-10-CM | POA: Diagnosis not present

## 2019-02-04 DIAGNOSIS — M4802 Spinal stenosis, cervical region: Secondary | ICD-10-CM | POA: Diagnosis not present

## 2019-02-09 DIAGNOSIS — M25551 Pain in right hip: Secondary | ICD-10-CM | POA: Diagnosis not present

## 2019-02-09 DIAGNOSIS — M4802 Spinal stenosis, cervical region: Secondary | ICD-10-CM | POA: Diagnosis not present

## 2019-02-11 DIAGNOSIS — M4802 Spinal stenosis, cervical region: Secondary | ICD-10-CM | POA: Diagnosis not present

## 2019-02-14 ENCOUNTER — Other Ambulatory Visit: Payer: Self-pay | Admitting: Orthopaedic Surgery

## 2019-02-14 DIAGNOSIS — M4802 Spinal stenosis, cervical region: Secondary | ICD-10-CM | POA: Diagnosis not present

## 2019-02-16 DIAGNOSIS — M4802 Spinal stenosis, cervical region: Secondary | ICD-10-CM | POA: Diagnosis not present

## 2019-02-18 DIAGNOSIS — M4802 Spinal stenosis, cervical region: Secondary | ICD-10-CM | POA: Diagnosis not present

## 2019-02-21 DIAGNOSIS — M4802 Spinal stenosis, cervical region: Secondary | ICD-10-CM | POA: Diagnosis not present

## 2019-02-25 DIAGNOSIS — M4802 Spinal stenosis, cervical region: Secondary | ICD-10-CM | POA: Diagnosis not present

## 2019-02-28 DIAGNOSIS — R9431 Abnormal electrocardiogram [ECG] [EKG]: Secondary | ICD-10-CM | POA: Diagnosis not present

## 2019-02-28 DIAGNOSIS — I10 Essential (primary) hypertension: Secondary | ICD-10-CM | POA: Diagnosis not present

## 2019-02-28 DIAGNOSIS — R0602 Shortness of breath: Secondary | ICD-10-CM | POA: Diagnosis not present

## 2019-02-28 DIAGNOSIS — M4802 Spinal stenosis, cervical region: Secondary | ICD-10-CM | POA: Diagnosis not present

## 2019-02-28 DIAGNOSIS — F17210 Nicotine dependence, cigarettes, uncomplicated: Secondary | ICD-10-CM | POA: Diagnosis not present

## 2019-03-02 DIAGNOSIS — M4802 Spinal stenosis, cervical region: Secondary | ICD-10-CM | POA: Diagnosis not present

## 2019-03-04 ENCOUNTER — Encounter (HOSPITAL_COMMUNITY): Payer: Self-pay

## 2019-03-04 DIAGNOSIS — M4802 Spinal stenosis, cervical region: Secondary | ICD-10-CM | POA: Diagnosis not present

## 2019-03-04 NOTE — Patient Instructions (Addendum)
DUE TO COVID-19 ONLY ONE VISITOR IS ALLOWED TO COME WITH YOU AND STAY IN THE WAITING ROOM ONLY DURING PRE OP AND PROCEDURE. THE ONE VISITOR MAY VISIT WITH YOU IN YOUR PRIVATE ROOM DURING VISITING HOURS ONLY!!   COVID SWAB TESTING MUST BE COMPLETED ON: Friday, Oct. 9, 2020 at 2:55PM.    [address] [facility] (Must self quarantine after testing. Follow instructions on handout.)             Your procedure is scheduled on: Tuesday, Oct. 13, 2020   Report to [facility] Main  Entrance    Report to admitting at 11:00 AM   Call this number if you have problems the morning of surgery [phone]   Do not eat food:After Midnight.   May have liquids until 10:30AM the day of surgery   CLEAR LIQUID DIET  Foods Allowed                                                                     Foods Excluded  Water, Black Coffee and tea, regular and decaf                             liquids that you cannot  Plain Jell-O in any flavor  (No red)                                           see through such as: Fruit ices (not with fruit pulp)                                     milk, soups, orange juice  Iced Popsicles (No red)                                    All solid food Carbonated beverages, regular and diet                                    Apple juices Sports drinks like Gatorade (No red) Lightly seasoned clear broth or consume(fat free) Sugar, honey syrup  Sample Menu Breakfast                                Lunch                                     Supper Cranberry juice                    Beef broth                            Chicken broth Jell-O  Grape juice                           Apple juice Coffee or tea                        Jell-O                                      Popsicle                                                Coffee or tea                        Coffee or  tea   Complete one Ensure drink the morning of surgery at 10:30AM the day of surgery.   Brush your teeth the morning of surgery.   Do NOT smoke after Midnight   Take these medicines the morning of surgery with A SIP OF WATER: Amantadine, Bupropion, Oxycodone, Esomeprazole, Lamotrigine, Levothyroxine, Hydroxyzine if needed                               You may not have any metal on your body including jewelry, and body piercings             Do not wear lotions, powders, perfumes/cologne, or deodorant                          Men may shave face and neck.   Do not bring valuables to the hospital. [place].   Contacts, dentures or bridgework may not be worn into surgery.   Bring small overnight bag day of surgery.    Special Instructions: Bring a copy of your healthcare power of attorney and living will documents         the day of surgery if you haven't scanned them in before.              Please read over the following fact sheets you were given:  [facility] - Preparing for Surgery Before surgery, you can play an important role.  Because skin is not sterile, your skin needs to be as free of germs as possible.  You can reduce the number of germs on your skin by washing with CHG (chlorahexidine gluconate) soap before surgery.  CHG is an antiseptic cleaner which kills germs and bonds with the skin to continue killing germs even after washing. Please DO NOT use if you have an allergy to CHG or antibacterial soaps.  If your skin becomes reddened/irritated stop using the CHG and inform your nurse when you arrive at Short Stay. Do not shave (including legs and underarms) for at least 48 hours prior to the first CHG shower.  You may shave your face/neck.  Please follow these instructions carefully:  1.  Shower with CHG Soap the night before surgery and the  morning of surgery.  2.  If you choose to wash your hair, wash your hair  first as usual  with your normal  shampoo.  3.  After you shampoo, rinse your hair and body thoroughly to remove the shampoo.                             4.  Use CHG as you would any other liquid soap.  You can apply chg directly to the skin and wash.  Gently with a scrungie or clean washcloth.  5.  Apply the CHG Soap to your body ONLY FROM THE NECK DOWN.   Do   not use on face/ open                           Wound or open sores. Avoid contact with eyes, ears mouth and   genitals (private parts).                       Wash face,  Genitals (private parts) with your normal soap.             6.  Wash thoroughly, paying special attention to the area where your    surgery  will be performed.  7.  Thoroughly rinse your body with warm water from the neck down.  8.  DO NOT shower/wash with your normal soap after using and rinsing off the CHG Soap.                9.  Pat yourself dry with a clean towel.            10.  Wear clean pajamas.            11.  Place clean sheets on your bed the night of your first shower and do not  sleep with pets. Day of Surgery : Do not apply any lotions/deodorants the morning of surgery.  Please wear clean clothes to the hospital/surgery center.  FAILURE TO FOLLOW THESE INSTRUCTIONS MAY RESULT IN THE CANCELLATION OF YOUR SURGERY  PATIENT SIGNATURE_________________________________  NURSE SIGNATURE__________________________________  ________________________________________________________________________   [name]  An incentive spirometer is a tool that can help keep your lungs clear and active. This tool measures how well you are filling your lungs with each breath. Taking long deep breaths may help reverse or decrease the chance of developing breathing (pulmonary) problems (especially infection) following:  A long period of time when you are unable to move or be active. BEFORE THE PROCEDURE   If the spirometer includes an indicator to show your best effort, your  nurse or respiratory therapist will set it to a desired goal.  If possible, sit up straight or lean slightly forward. Try not to slouch.  Hold the incentive spirometer in an upright position. INSTRUCTIONS FOR USE  1. Sit on the edge of your bed if possible, or sit up as far as you can in bed or on a chair. 2. Hold the incentive spirometer in an upright position. 3. Breathe out normally. 4. Place the mouthpiece in your mouth and seal your lips tightly around it. 5. Breathe in slowly and as deeply as possible, raising the piston or the ball toward the top of the column. 6. Hold your breath for 3-5 seconds or for as long as possible. Allow the piston or ball to fall to the bottom of the column. 7. Remove the mouthpiece from your mouth and breathe out normally. 8. Rest for a few seconds  and repeat Steps 1 through 7 at least 10 times every 1-2 hours when you are awake. Take your time and take a few normal breaths between deep breaths. 9. The spirometer may include an indicator to show your best effort. Use the indicator as a goal to work toward during each repetition. 10. After each set of 10 deep breaths, practice coughing to be sure your lungs are clear. If you have an incision (the cut made at the time of surgery), support your incision when coughing by placing a pillow or rolled up towels firmly against it. Once you are able to get out of bed, walk around indoors and cough well. You may stop using the incentive spirometer when instructed by your caregiver.  RISKS AND COMPLICATIONS  Take your time so you do not get dizzy or light-headed.  If you are in pain, you may need to take or ask for pain medication before doing incentive spirometry. It is harder to take a deep breath if you are having pain. AFTER USE  Rest and breathe slowly and easily.  It can be helpful to keep track of a log of your progress. Your caregiver can provide you with a simple table to help with this. If you are using the  spirometer at home, follow these instructions: [place] IF:   You are having difficultly using the spirometer.  You have trouble using the spirometer as often as instructed.  Your pain medication is not giving enough relief while using the spirometer.  You develop fever of 100.5 F (38.1 C) or higher. SEEK IMMEDIATE MEDICAL CARE IF:   You cough up bloody sputum that had not been present before.  You develop fever of 102 F (38.9 C) or greater.  You develop worsening pain at or near the incision site. MAKE SURE YOU:   Understand these instructions.  Will watch your condition.  Will get help right away if you are not doing well or get worse. Document Released: 09/29/2006 Document Revised: 08/11/2011 Document Reviewed: 11/30/2006 ExitCare Patient Information 2014 [employer], [place].   ________________________________________________________________________  WHAT IS A BLOOD TRANSFUSION? Blood Transfusion Information  A transfusion is the replacement of blood or some of its parts. Blood is made up of multiple cells which provide different functions.  Red blood cells carry oxygen and are used for blood loss replacement.  White blood cells fight against infection.  Platelets control bleeding.  Plasma helps clot blood.  Other blood products are available for specialized needs, such as hemophilia or other clotting disorders. BEFORE THE TRANSFUSION  Who gives blood for transfusions?   Healthy volunteers who are fully evaluated to make sure their blood is safe. This is blood bank blood. Transfusion therapy is the safest it has ever been in the practice of medicine. Before blood is taken from a donor, a complete history is taken to make sure that person has no history of diseases nor engages in risky social behavior (examples are intravenous drug use or sexual activity with multiple partners). The donor's travel history is screened to minimize risk of transmitting  infections, such as malaria. The donated blood is tested for signs of infectious diseases, such as HIV and hepatitis. The blood is then tested to be sure it is compatible with you in order to minimize the chance of a transfusion reaction. If you or a relative donates blood, this is often done in anticipation of surgery and is not appropriate for emergency situations. It takes many days to process the  donated blood. RISKS AND COMPLICATIONS Although transfusion therapy is very safe and saves many lives, the main dangers of transfusion include:   Getting an infectious disease.  Developing a transfusion reaction. This is an allergic reaction to something in the blood you were given. Every precaution is taken to prevent this. The decision to have a blood transfusion has been considered carefully by your caregiver before blood is given. Blood is not given unless the benefits outweigh the risks. AFTER THE TRANSFUSION  Right after receiving a blood transfusion, you will usually feel much better and more energetic. This is especially true if your red blood cells have gotten low (anemic). The transfusion raises the level of the red blood cells which carry oxygen, and this usually causes an energy increase.  The nurse administering the transfusion will monitor you carefully for complications. HOME CARE INSTRUCTIONS  No special instructions are needed after a transfusion. You may find your energy is better. Speak with your caregiver about any limitations on activity for underlying diseases you may have. SEEK MEDICAL CARE IF:   Your condition is not improving after your transfusion.  You develop redness or irritation at the intravenous (IV) site. SEEK IMMEDIATE MEDICAL CARE IF:  Any of the following symptoms occur over the next 12 hours:  Shaking chills.  You have a temperature by mouth above 102 F (38.9 C), not controlled by medicine.  Chest, back, or muscle pain.  People around you feel you are  not acting correctly or are confused.  Shortness of breath or difficulty breathing.  Dizziness and fainting.  You get a rash or develop hives.  You have a decrease in urine output.  Your urine turns a dark color or changes to pink, red, or brown. Any of the following symptoms occur over the next 10 days:  You have a temperature by mouth above 102 F (38.9 C), not controlled by medicine.  Shortness of breath.  Weakness after normal activity.  The white part of the eye turns yellow (jaundice).  You have a decrease in the amount of urine or are urinating less often.  Your urine turns a dark color or changes to pink, red, or brown. Document Released: 05/16/2000 Document Revised: 08/11/2011 Document Reviewed: 01/03/2008 [facility] Patient Information 2014 [place], [place].  _______________________________________________________________________

## 2019-03-07 DIAGNOSIS — M4802 Spinal stenosis, cervical region: Secondary | ICD-10-CM | POA: Diagnosis not present

## 2019-03-08 ENCOUNTER — Encounter (HOSPITAL_COMMUNITY): Payer: Self-pay

## 2019-03-08 ENCOUNTER — Ambulatory Visit (HOSPITAL_COMMUNITY)
Admission: RE | Admit: 2019-03-08 | Discharge: 2019-03-08 | Disposition: A | Discharge status: Home / Self Care | Payer: 59 | Source: Ambulatory Visit | Attending: Orthopaedic Surgery | Admitting: Orthopaedic Surgery

## 2019-03-08 ENCOUNTER — Other Ambulatory Visit: Payer: Self-pay

## 2019-03-08 ENCOUNTER — Encounter (HOSPITAL_COMMUNITY)
Admission: RE | Admit: 2019-03-08 | Discharge: 2019-03-08 | Disposition: A | Discharge status: Home / Self Care | Payer: 59 | Source: Ambulatory Visit | Attending: Orthopaedic Surgery | Admitting: Orthopaedic Surgery

## 2019-03-08 DIAGNOSIS — Z01818 Encounter for other preprocedural examination: Secondary | ICD-10-CM | POA: Diagnosis not present

## 2019-03-08 HISTORY — DX: Atherosclerotic heart disease of native coronary artery without angina pectoris: I25.10

## 2019-03-08 HISTORY — DX: Rhabdomyolysis: M62.82

## 2019-03-08 LAB — CBC WITH DIFFERENTIAL/PLATELET
Abs Immature Granulocytes: 0.11 10*3/uL — ABNORMAL HIGH (ref 0.00–0.07)
Basophils Absolute: 0.1 10*3/uL (ref 0.0–0.1)
Basophils Relative: 1 %
Eosinophils Absolute: 0.4 10*3/uL (ref 0.0–0.5)
Eosinophils Relative: 3 %
HCT: 44 % (ref 39.0–52.0)
Hemoglobin: 14.9 g/dL (ref 13.0–17.0)
Immature Granulocytes: 1 %
Lymphocytes Relative: 18 %
Lymphs Abs: 2.2 10*3/uL (ref 0.7–4.0)
MCH: 29.2 pg (ref 26.0–34.0)
MCHC: 33.9 g/dL (ref 30.0–36.0)
MCV: 86.1 fL (ref 80.0–100.0)
Monocytes Absolute: 0.9 10*3/uL (ref 0.1–1.0)
Monocytes Relative: 7 %
Neutro Abs: 8.6 10*3/uL — ABNORMAL HIGH (ref 1.7–7.7)
Neutrophils Relative %: 70 %
Platelets: 402 10*3/uL — ABNORMAL HIGH (ref 150–400)
RBC: 5.11 MIL/uL (ref 4.22–5.81)
RDW: 11.9 % (ref 11.5–15.5)
WBC: 12.2 10*3/uL — ABNORMAL HIGH (ref 4.0–10.5)
nRBC: 0 % (ref 0.0–0.2)

## 2019-03-08 LAB — PROTIME-INR
INR: 0.9 (ref 0.8–1.2)
Prothrombin Time: 12.2 seconds (ref 11.4–15.2)

## 2019-03-08 LAB — ABO/RH: ABO/RH(D): O POS

## 2019-03-08 LAB — URINALYSIS, ROUTINE W REFLEX MICROSCOPIC
Bilirubin Urine: NEGATIVE
Glucose, UA: NEGATIVE mg/dL
Hgb urine dipstick: NEGATIVE
Ketones, ur: NEGATIVE mg/dL
Leukocytes,Ua: NEGATIVE
Nitrite: NEGATIVE
Protein, ur: NEGATIVE mg/dL
Specific Gravity, Urine: 1.013 (ref 1.005–1.030)
pH: 6 (ref 5.0–8.0)

## 2019-03-08 LAB — BASIC METABOLIC PANEL
Anion gap: 12 (ref 5–15)
BUN: 19 mg/dL (ref 6–20)
CO2: 26 mmol/L (ref 22–32)
Calcium: 9.8 mg/dL (ref 8.9–10.3)
Chloride: 103 mmol/L (ref 98–111)
Creatinine, Ser: 1.44 mg/dL — ABNORMAL HIGH (ref 0.61–1.24)
GFR calc Af Amer: >60 mL/min (ref >60)
GFR calc non Af Amer: 55 mL/min — ABNORMAL LOW (ref >60)
Glucose, Bld: 117 mg/dL — ABNORMAL HIGH (ref 70–99)
Potassium: 3.9 mmol/L (ref 3.5–5.1)
Sodium: 141 mmol/L (ref 135–145)

## 2019-03-08 LAB — SURGICAL PCR SCREEN
MRSA, PCR: NEGATIVE
Staphylococcus aureus: NEGATIVE

## 2019-03-08 LAB — APTT: aPTT: 28 seconds (ref 24–36)

## 2019-03-08 NOTE — Progress Notes (Signed)
PCP - Dr. Gwenevere [facility] Cardiologist - Dr. Orlena [facility]  Chest x-ray - 03/08/2019 in epic EKG - 02/28/2019 on chart Stress Test - scheduled for 03/10/2019 ECHO -scheduled for 03/14/2019  Cardiac Cath - greater than 2 years  Sleep Study - N/A CPAP - N/A  Fasting Blood Sugar - N/A Checks Blood Sugar __N/A___ times a day  Blood Thinner Instructions:  N/A Aspirin Instructions:  N/A Last Dose:  N/A  Anesthesia review: awaiting cardiac clearance from Dr. Claudie Leach has several pending test scheduled, CKD, HTN  Patient denies shortness of breath, fever, cough and chest pain at PAT appointment   Patient verbalized understanding of instructions that were given to them at the PAT appointment. Patient was also instructed that they will need to review over the PAT instructions again at home before surgery.

## 2019-03-08 NOTE — Progress Notes (Signed)
SPOKE W/  Aaron Edelman     SCREENING SYMPTOMS OF COVID 19:   COUGH--NO  RUNNY NOSE--- NO  SORE THROAT---NO  NASAL CONGESTION----NO  SNEEZING----NO  SHORTNESS OF BREATH---NO  DIFFICULTY BREATHING---NO  TEMP >100.0 -----NO  UNEXPLAINED BODY ACHES------NO  CHILLS -------- NO  HEADACHES ---------NO  LOSS OF SMELL/ TASTE --------NO    HAVE YOU OR ANY FAMILY MEMBER TRAVELLED PAST 14 DAYS OUT OF THE   COUNTY---Lives in [facility] STATE----NO COUNTRY----NO  HAVE YOU OR ANY FAMILY MEMBER BEEN EXPOSED TO ANYONE WITH COVID 19? NO

## 2019-03-09 DIAGNOSIS — M4802 Spinal stenosis, cervical region: Secondary | ICD-10-CM | POA: Diagnosis not present

## 2019-03-10 ENCOUNTER — Other Ambulatory Visit (HOSPITAL_COMMUNITY): Payer: 59

## 2019-03-10 DIAGNOSIS — R9431 Abnormal electrocardiogram [ECG] [EKG]: Secondary | ICD-10-CM | POA: Diagnosis not present

## 2019-03-10 DIAGNOSIS — Z7189 Other specified counseling: Secondary | ICD-10-CM | POA: Diagnosis not present

## 2019-03-10 NOTE — Progress Notes (Signed)
Anesthesia Chart Review   Case: [id number] Date/Time: 03/15/19 1316   Procedure: RIGHT TOTAL HIP ARTHROPLASTY ANTERIOR APPROACH (Right Hip)   Anesthesia type: Spinal   Pre-op diagnosis: RIGHT HIP AVASCULAR NECROSIS   Location: [facility] ROOM 06 / [facility]   Surgeon: Melrose Nakayama, MD      DISCUSSION:53 y.o. current some day smoker (10 pack years) with h/o bipolar effective disorder, GERD, hypothyroidism, CAD, right hip AVN scheduled for above procedure 03/15/2019 with Dr. Melrose Nakayama.   Pt scheduled for upcoming stress and echo for clearance from Dr. Claudie Leach.   Addendum 03/14/2019:  Clearance received from cardiologist, Dr. Claudie Leach, today 03/14/2019.  Clearance states pt is optimized from a cardiac standpoint.  Per OV note, "The patient presents for echocardiogram review.  There is no clinical CHF or unstable angina.  The echocardiogram shows normal cardia anatomy, LV/RV systolic function, valve anatomy and function and normal pericardium.  Thoracic aorta is stable/normal.  The patient's recent stress MPI study is also normal.  No further cardiovascular testing or intervention recommended prior to elective hip surgery.  F/u next year for PAD f/u"  Anticipate pt can proceed with planned procedure barring acute status change.   VS: BP 119/88   Pulse 64   Temp 36.9 C (Oral)   Resp 16   Ht 6' (1.829 m)   Wt 103.5 kg   SpO2 100%   BMI 30.94 kg/m   PROVIDERS: Harvie Junior, MD is PCP   Lawson Radar, MD is Cardiologist  LABS: Labs reviewed: Acceptable for surgery. (all labs ordered are listed, but only abnormal results are displayed)  Labs Reviewed  BASIC METABOLIC PANEL - Abnormal; Notable for the following components:      Result Value   Glucose, Bld 117 (*)    Creatinine, Ser 1.44 (*)    GFR calc non Af Amer 55 (*)    All other components within normal limits  CBC WITH DIFFERENTIAL/PLATELET - Abnormal; Notable for the following components:   WBC 12.2 (*)    Platelets 402  (*)    Neutro Abs 8.6 (*)    Abs Immature Granulocytes 0.11 (*)    All other components within normal limits  SURGICAL PCR SCREEN  APTT  PROTIME-INR  URINALYSIS, ROUTINE W REFLEX MICROSCOPIC  TYPE AND SCREEN  ABO/RH     IMAGES: Chest Xray 03/08/2019 FINDINGS: The heart size and mediastinal contours are within normal limits. Both lungs are clear. Partially imaged anterior cervical fusion hardware and thoracic spinal stimulator leads.  IMPRESSION: No acute abnormality of the lungs.  EKG:   CV: Echo 03/14/2019 Conclusions 1. Normal study: normal cardiac chamber sizes and function; normal valve anatomy and function; no pericardial or intracardiac mass.  NO intracardiac shunt(s) by 2-dimensional and color flow imaging.  Normal thoracic aorta and aortic arch.   Myocardial Perfusion Imaging 03/14/2019 Impressions Both the Rest and Stress Images are with normal limits.  NO significant reversible ischemia or fixed scar.   Cardiac Cath 08/31/15 1. Angiographically minimal CAD. Mid LAD to Dist LAD lesion, 35% stenosed. Prox Cx lesion, 40% stenosed. 2. The left ventricular systolic function is normal.    The patient does have minimal CAD angiographically, but did respond to IC nitroglycerin. As a smoker, cannot discount microvascular disease versus diffuse coronary spasm. Consider long-lasting nitrate.  Plan:  Return to short stay holding area for TR band removal expected discharge later today.  He will follow up with me at least once and then will return to Mr.  Duran  Myocardial Perfusion Imaging 03/06/2015 Impressions The resting ECG shows normal sinus rhythm  The resting and stress ECG shows normal ST segment: and no ventricular tachycardia, significant QRS prolongation or heart block.  Both the Rest and Stress Images are with normal limits.  No significant reversible ischemia or fixed scar.   Gated Left Ventricular Ejection Fraction is low-normal (49%). Normal LV  segmental wall motion.   Echo 02/20/2015 Conclusion 1. There eis mild concentric left ventricular hypertophy  2. The tricuspid valve appears structurally normal.  3. Mild-to-moderate tricuspid regurgitation present 4. There is no evidence of pulmonary hypertension.  5. Normal cardiac chamber sizes and function; normal valve anatomy; no pericardial effusion or intracardiac mass.  No intracardiac shunt(s) by 2-dimensional and color flow imaging. Normal thoracic aorta and aortic arch.  Past Medical History:  Diagnosis Date  . Accelerated hypertension 08/23/2016   pt denies this  . Anxiety   . Arthritis   . Bipolar affective disorder ([place])   . Chronic back pain   . Chronic kidney disease 2017   AKF - due to Rhabdomyolysis  . Coronary artery disease    minimal Mid LAD to Dist LAD lesion, 35% stenosed. Prox Cx lesion, 40% stenosed.  . DDD (degenerative disc disease)   . Depression   . DJD (degenerative joint disease)   . Elevated liver function tests   . Fibromyalgia   . GERD (gastroesophageal reflux disease)   . Head injury   . Head injury, closed, with concussion    x 3  from falls  . Hyperlipidemia   . Hypothyroidism   . IBS (irritable bowel syndrome)    with diarrhea  . Inguinal hernia    left  . Insomnia   . Myofascial pain syndrome   . Osteopenia   . Pneumonia   . Rhabdomyolysis   . Shortness of breath dyspnea    with exertion  . Tibial plateau fracture, left   . Tremors of nervous system     Past Surgical History:  Procedure Laterality Date  . ANTERIOR CERVICAL DECOMP/DISCECTOMY FUSION N/A 09/21/2017   Procedure: ANTERIOR CERVICAL DECOMPRESSION/DISCECTOMY FUSION - CERVICAL THREE-CERVICAL FOUR;  Surgeon: Kary Kos, MD;  Location: [place];  Service: Neurosurgery;  Laterality: N/A;  . ANTERIOR CERVICAL DECOMP/DISCECTOMY FUSION N/A 12/23/2017   Procedure: REMOVAL AND REPLACEMENT OF CERVICAL THREE-FOUR HARDWARE;  Surgeon: Kary Kos, MD;  Location: [place];  Service:  Neurosurgery;  Laterality: N/A;  . CARDIAC CATHETERIZATION N/A 08/31/2015   Procedure: Left Heart Cath and Coronary Angiography;  Surgeon: Leonie Man, MD;  Location: [place] CV LAB;  Service: Cardiovascular;  Laterality: N/A;  . Carotid Dopplers Bilateral 02/20/2015   [facility]: Mild, less than 39% left and right internal carotid artery stenosis. No significant plaque burden  . CERVICAL DISC SURGERY     C5-7  . COLONOSCOPY    . COLONOSCOPY    . ESOPHAGOGASTRODUODENOSCOPY    . FASCIOTOMY Left 10/20/2013   Procedure: LEFT leg ANTERIOR COMPARTMENT FACSCIOTOMY;  Surgeon: Rozanna Box, MD;  Location: [place];  Service: Orthopedics;  Laterality: Left;  . INGUINAL HERNIA REPAIR Left   . Nuclear Stress Test  03/06/2015   [facility]: Normal EKG. Low normal EF (49%) normal regional wall motion. No evidence of ischemia or infarction.  . ORIF TIBIA PLATEAU Left 10/20/2013   Procedure: OPEN REDUCTION INTERNAL FIXATION (ORIF) LEFT TIBIAL PLATEAU;  Surgeon: Rozanna Box, MD;  Location: [place];  Service: Orthopedics;  Laterality: Left;  .  SEPTOPLASTY    . SPINAL CORD STIMULATOR BATTERY EXCHANGE N/A 02/08/2016   Procedure: Lumbar spinal cord stimulator implantable pulse generator replacement;  Surgeon: Clydell Hakim, MD;  Location: [place] NEURO ORS;  Service: Neurosurgery;  Laterality: N/A;  . SPINAL CORD STIMULATOR BATTERY EXCHANGE N/A 12/23/2017   Procedure: SPINAL CORD STIMULATOR BATTERY EXCHANGE;  Surgeon: Clydell Hakim, MD;  Location: [place];  Service: Neurosurgery;  Laterality: N/A;  . SPINAL CORD STIMULATOR IMPLANT    . TRANSTHORACIC ECHOCARDIOGRAM  02/20/2015   [facility]: Mild concentric LVH. EF 55-60%. Normal regional wall motion. Mild to moderate TR with no significant pulmonary hypertension.    MEDICATIONS: . amantadine (SYMMETREL) 100 MG capsule  . amitriptyline (ELAVIL) 25 MG tablet  . aspirin EC 81 MG EC tablet  . atenolol-chlorthalidone (TENORETIC)  100-25 MG tablet  . benztropine (COGENTIN) 0.5 MG tablet  . buPROPion (WELLBUTRIN XL) 150 MG 24 hr tablet  . busPIRone (BUSPAR) 15 MG tablet  . cyclobenzaprine (FLEXERIL) 10 MG tablet  . esomeprazole (NEXIUM) 40 MG capsule  . hydrOXYzine (ATARAX/VISTARIL) 25 MG tablet  . hydrOXYzine (ATARAX/VISTARIL) 50 MG tablet  . lamoTRIgine (LAMICTAL) 100 MG tablet  . levothyroxine (SYNTHROID, LEVOTHROID) 50 MCG tablet  . milk thistle 175 MG tablet  . nicotine (NICODERM CQ - DOSED IN MG/24 HOURS) 14 mg/24hr patch  . nicotine (NICODERM CQ - DOSED IN MG/24 HOURS) 21 mg/24hr patch  . nicotine polacrilex (COMMIT) 4 MG lozenge  . nitroGLYCERIN (NITROSTAT) 0.4 MG SL tablet  . OLANZapine (ZYPREXA) 20 MG tablet  . Omega-3 Fatty Acids (FISH OIL PO)  . oxyCODONE (ROXICODONE) 15 MG immediate release tablet  . pantoprazole (PROTONIX) 40 MG tablet  . promethazine (PHENERGAN) 25 MG tablet  . Vitamin D, Ergocalciferol, (DRISDOL) 50000 units CAPS capsule   No current facility-administered medications for this encounter.       Konrad Felix, PA-C [facility] Pre-Surgical Testing [phone] 03/14/19  1:19 PM

## 2019-03-11 ENCOUNTER — Other Ambulatory Visit (HOSPITAL_COMMUNITY): Payer: 59

## 2019-03-11 ENCOUNTER — Other Ambulatory Visit (HOSPITAL_COMMUNITY)
Admission: RE | Admit: 2019-03-11 | Discharge: 2019-03-11 | Disposition: A | Discharge status: Home / Self Care | Payer: 59 | Source: Ambulatory Visit | Attending: Orthopaedic Surgery | Admitting: Orthopaedic Surgery

## 2019-03-11 DIAGNOSIS — M4802 Spinal stenosis, cervical region: Secondary | ICD-10-CM | POA: Diagnosis not present

## 2019-03-11 DIAGNOSIS — Z20828 Contact with and (suspected) exposure to other viral communicable diseases: Secondary | ICD-10-CM | POA: Insufficient documentation

## 2019-03-11 DIAGNOSIS — Z01812 Encounter for preprocedural laboratory examination: Secondary | ICD-10-CM | POA: Insufficient documentation

## 2019-03-14 DIAGNOSIS — Z7189 Other specified counseling: Secondary | ICD-10-CM | POA: Diagnosis not present

## 2019-03-14 DIAGNOSIS — M79662 Pain in left lower leg: Secondary | ICD-10-CM | POA: Diagnosis not present

## 2019-03-14 DIAGNOSIS — R0989 Other specified symptoms and signs involving the circulatory and respiratory systems: Secondary | ICD-10-CM | POA: Diagnosis not present

## 2019-03-14 DIAGNOSIS — Z720 Tobacco use: Secondary | ICD-10-CM | POA: Diagnosis not present

## 2019-03-14 DIAGNOSIS — F17210 Nicotine dependence, cigarettes, uncomplicated: Secondary | ICD-10-CM | POA: Diagnosis not present

## 2019-03-14 DIAGNOSIS — I340 Nonrheumatic mitral (valve) insufficiency: Secondary | ICD-10-CM | POA: Diagnosis not present

## 2019-03-14 DIAGNOSIS — I10 Essential (primary) hypertension: Secondary | ICD-10-CM | POA: Diagnosis not present

## 2019-03-14 DIAGNOSIS — M79661 Pain in right lower leg: Secondary | ICD-10-CM | POA: Diagnosis not present

## 2019-03-14 DIAGNOSIS — R9431 Abnormal electrocardiogram [ECG] [EKG]: Secondary | ICD-10-CM | POA: Diagnosis not present

## 2019-03-14 DIAGNOSIS — M4802 Spinal stenosis, cervical region: Secondary | ICD-10-CM | POA: Diagnosis not present

## 2019-03-14 LAB — NOVEL CORONAVIRUS, NAA (HOSP ORDER, SEND-OUT TO REF LAB; TAT 18-24 HRS): SARS-CoV-2, NAA: NOT DETECTED

## 2019-03-14 MED ORDER — TRANEXAMIC ACID 1000 MG/10ML IV SOLN
2000.0000 mg | INTRAVENOUS | Status: DC
  Filled 2019-03-14: qty 20

## 2019-03-14 MED ORDER — BUPIVACAINE LIPOSOME 1.3 % IJ SUSP
10.0000 mL | Freq: Once | INTRAMUSCULAR | Status: DC
  Filled 2019-03-14: qty 10

## 2019-03-14 NOTE — Anesthesia Preprocedure Evaluation (Addendum)
Anesthesia Evaluation  Patient identified by MRN, date of birth, ID band Patient awake    Reviewed: Allergy & Precautions, NPO status , Patient's Chart, lab work & pertinent test results, reviewed documented beta blocker date and time   Airway Mallampati: I       Dental  (+) Edentulous Lower, Edentulous Upper   Pulmonary Current Smoker and Patient abstained from smoking.,    Pulmonary exam normal breath sounds clear to auscultation       Cardiovascular hypertension, Pt. on medications and Pt. on home beta blockers + CAD  Normal cardiovascular exam Rhythm:Regular Rate:Normal     Neuro/Psych PSYCHIATRIC DISORDERS Anxiety Depression Bipolar Disorder    GI/Hepatic GERD  Medicated and Controlled,  Endo/Other  Hypothyroidism   Renal/GU Renal InsufficiencyRenal disease     Musculoskeletal   Abdominal (+) + obese,   Peds  Hematology   Anesthesia Other Findings   Reproductive/Obstetrics                            Anesthesia Physical Anesthesia Plan  ASA: II  Anesthesia Plan: General   Post-op Pain Management:    Induction: Intravenous  PONV Risk Score and Plan: 2 and Ondansetron, Dexamethasone and Midazolam  Airway Management Planned: Oral ETT  Additional Equipment: None  Intra-op Plan:   Post-operative Plan: Extubation in OR  Informed Consent:   Plan Discussed with:   Anesthesia Plan Comments: (See PAT note 03/08/2019, Konrad Felix, PA-C)       Anesthesia Quick Evaluation

## 2019-03-14 NOTE — H&P (Signed)
TOTAL HIP ADMISSION H&P  Patient is admitted for right total hip arthroplasty.  Subjective:  Chief Complaint: right hip pain  HPI: [name], 53 y.o. male, has a history of pain and functional disability in the right hip(s) due to arthritis and patient has failed non-surgical conservative treatments for greater than 12 weeks to include NSAID's and/or analgesics, corticosteriod injections, flexibility and strengthening excercises, use of assistive devices, weight reduction as appropriate and activity modification.  Onset of symptoms was gradual starting 5 years ago with gradually worsening course since that time.The patient noted no past surgery on the right hip(s).  Patient currently rates pain in the right hip at 10 out of 10 with activity. Patient has night pain, worsening of pain with activity and weight bearing, trendelenberg gait, pain that interfers with activities of daily living and crepitus. Patient has evidence of subchondral cysts, subchondral sclerosis, periarticular osteophytes and joint space narrowing by imaging studies. This condition presents safety issues increasing the risk of falls. There is no current active infection.  Patient Active Problem List   Diagnosis Date Noted  . Pseudoarthrosis of cervical spine ([place]) 12/23/2017  . Bipolar I disorder, most recent episode depressed, severe with psychotic features ([place]) 10/04/2017  . Spinal stenosis of cervical region 09/21/2017  . Chronic pain syndrome 10/28/2016  . Cannabis use disorder, mild, abuse 10/28/2016  . Rhabdomyolysis 08/23/2016  . AKI (acute kidney injury) ([place]) 08/23/2016  . Transaminitis 08/23/2016  . Drug overdose, multiple drugs 08/23/2016  . Tachycardia 08/23/2016  . Accelerated hypertension 08/23/2016  . Bipolar disorder, curr episode mixed, severe, with psychotic features ([place]) 12/26/2015  . Cocaine use disorder, mild, abuse ([place]) 12/26/2015  . History of rhabdomyolysis 12/26/2015  . Liver injury  12/26/2015  . Hx of renal failure 12/26/2015  . Angina, class III ([place]) 08/18/2015  . Dyslipidemia, goal LDL below 70 08/18/2015  . Family history of premature coronary artery disease 08/18/2015  . Tobacco use disorder, continuous 08/18/2015  . Tibial plateau fracture 10/02/2013  . Multiple falls 10/02/2013  . Alteration in self-care ability 10/02/2013  . Hypothyroidism, lithium induced 10/02/2013  . Fracture of tibia with fibula, left, closed 10/02/2013  . Chronic back pain 10/02/2013  . Spinal cord stimulator, status-post 10/02/2013  . History of suicidal ideation 10/02/2013  . Postural imbalance with levoscoliosis, h/o 10/02/2013  . Degenerative disc disease, lumbar 09/07/2013  . Chest pain 12/25/2012  . Leg pain 02/24/2011  . Neck pain 02/24/2011  . Right hip pain 02/24/2011  . IRRITABLE BOWEL SYNDROME 08/09/2008  . HYPERLIPIDEMIA 10/28/2006  . DISORDER, BIPOLAR NOS 10/28/2006  . ANXIETY 10/28/2006  . DEPRESSION 10/28/2006  . PAIN, CHRONIC NEC 10/28/2006  . GERD 10/28/2006   Past Medical History:  Diagnosis Date  . Accelerated hypertension 08/23/2016   pt denies this  . Anxiety   . Arthritis   . Bipolar affective disorder ([place])   . Chronic back pain   . Chronic kidney disease 2017   AKF - due to Rhabdomyolysis  . Coronary artery disease    minimal Mid LAD to Dist LAD lesion, 35% stenosed. Prox Cx lesion, 40% stenosed.  . DDD (degenerative disc disease)   . Depression   . DJD (degenerative joint disease)   . Elevated liver function tests   . Fibromyalgia   . GERD (gastroesophageal reflux disease)   . Head injury   . Head injury, closed, with concussion    x 3  from falls  . Hyperlipidemia   . Hypothyroidism   .  IBS (irritable bowel syndrome)    with diarrhea  . Inguinal hernia    left  . Insomnia   . Myofascial pain syndrome   . Osteopenia   . Pneumonia   . Rhabdomyolysis   . Shortness of breath dyspnea    with exertion  . Tibial plateau fracture, left    . Tremors of nervous system     Past Surgical History:  Procedure Laterality Date  . ANTERIOR CERVICAL DECOMP/DISCECTOMY FUSION N/A 09/21/2017   Procedure: ANTERIOR CERVICAL DECOMPRESSION/DISCECTOMY FUSION - CERVICAL THREE-CERVICAL FOUR;  Surgeon: Kary Kos, MD;  Location: [place];  Service: Neurosurgery;  Laterality: N/A;  . ANTERIOR CERVICAL DECOMP/DISCECTOMY FUSION N/A 12/23/2017   Procedure: REMOVAL AND REPLACEMENT OF CERVICAL THREE-FOUR HARDWARE;  Surgeon: Kary Kos, MD;  Location: [place];  Service: Neurosurgery;  Laterality: N/A;  . CARDIAC CATHETERIZATION N/A 08/31/2015   Procedure: Left Heart Cath and Coronary Angiography;  Surgeon: Leonie Man, MD;  Location: [place] [facility];  Service: Cardiovascular;  Laterality: N/A;  . Carotid Dopplers Bilateral 02/20/2015   [facility]: Mild, less than 39% left and right internal carotid artery stenosis. No significant plaque burden  . CERVICAL DISC SURGERY     C5-7  . COLONOSCOPY    . COLONOSCOPY    . ESOPHAGOGASTRODUODENOSCOPY    . FASCIOTOMY Left 10/20/2013   Procedure: LEFT leg ANTERIOR COMPARTMENT FACSCIOTOMY;  Surgeon: Rozanna Box, MD;  Location: [place];  Service: Orthopedics;  Laterality: Left;  . INGUINAL HERNIA REPAIR Left   . Nuclear Stress Test  03/06/2015   [facility]: Normal EKG. Low normal EF (49%) normal regional wall motion. No evidence of ischemia or infarction.  . ORIF TIBIA PLATEAU Left 10/20/2013   Procedure: OPEN REDUCTION INTERNAL FIXATION (ORIF) LEFT TIBIAL PLATEAU;  Surgeon: Rozanna Box, MD;  Location: [place];  Service: Orthopedics;  Laterality: Left;  . SEPTOPLASTY    . SPINAL CORD STIMULATOR BATTERY EXCHANGE N/A 02/08/2016   Procedure: Lumbar spinal cord stimulator implantable pulse generator replacement;  Surgeon: Clydell Hakim, MD;  Location: [place] [facility];  Service: Neurosurgery;  Laterality: N/A;  . SPINAL CORD STIMULATOR BATTERY EXCHANGE N/A 12/23/2017   Procedure: SPINAL CORD  STIMULATOR BATTERY EXCHANGE;  Surgeon: Clydell Hakim, MD;  Location: [place];  Service: Neurosurgery;  Laterality: N/A;  . SPINAL CORD STIMULATOR IMPLANT    . TRANSTHORACIC ECHOCARDIOGRAM  02/20/2015   [facility]: Mild concentric LVH. EF 55-60%. Normal regional wall motion. Mild to moderate TR with no significant pulmonary hypertension.    Current Facility-Administered Medications  Medication Dose Route Frequency Provider Last Rate Last Dose  . [START ON 03/15/2019] bupivacaine liposome (EXPAREL) 1.3 % injection 133 mg  10 mL Other Once Melrose Nakayama, MD      . Derrill Memo ON 03/15/2019] tranexamic acid (CYKLOKAPRON) 2,000 mg in sodium chloride 0.9 % 50 mL Topical Application  123XX123 mg Topical To OR Melrose Nakayama, MD       Current Outpatient Medications  Medication Sig Dispense Refill Last Dose  . amantadine (SYMMETREL) 100 MG capsule Take 50 mg by mouth 2 (two) times daily.      Marland Kitchen amitriptyline (ELAVIL) 25 MG tablet Take 3 tablets (75 mg total) by mouth at bedtime. For insomnia/depression (Patient taking differently: Take 75 mg by mouth at bedtime. ) 90 tablet 0   . atenolol-chlorthalidone (TENORETIC) 100-25 MG tablet Take 1 tablet by mouth daily.     Marland Kitchen buPROPion (WELLBUTRIN XL) 150 MG 24 hr tablet  Take 1 tablet (150 mg total) by mouth daily. For depression (Patient taking differently: Take 150 mg by mouth daily. ) 30 tablet 0   . busPIRone (BUSPAR) 15 MG tablet Take 1 tablet (15 mg total) by mouth 2 (two) times daily. For anxiety (Patient taking differently: Take 15 mg by mouth 2 (two) times daily. ) 60 tablet 0   . esomeprazole (NEXIUM) 40 MG capsule Take 40 mg by mouth daily at 12 noon.     . hydrOXYzine (ATARAX/VISTARIL) 50 MG tablet Take 50 mg by mouth 3 (three) times daily as needed for anxiety.     . lamoTRIgine (LAMICTAL) 100 MG tablet Take 0.5 tablets (50 mg total) by mouth 2 (two) times daily. For mood stabilization (Patient taking differently: Take 50 mg by mouth 2 (two) times  daily. ) 60 tablet 0   . levothyroxine (SYNTHROID, LEVOTHROID) 50 MCG tablet Take 1 tablet (50 mcg total) by mouth daily before breakfast. For hypothyroidism (Patient taking differently: Take 50 mcg by mouth daily before breakfast. )     . milk thistle 175 MG tablet Take 350 mg by mouth daily.     . nicotine (NICODERM CQ - DOSED IN MG/24 HOURS) 21 mg/24hr patch Place 21 mg onto the skin daily.      . nicotine polacrilex (COMMIT) 4 MG lozenge Take 4 mg by mouth as needed for smoking cessation.     . nitroGLYCERIN (NITROSTAT) 0.4 MG SL tablet Place 1 tablet (0.4 mg total) under the tongue every 5 (five) minutes as needed for chest pain.  12   . OLANZapine (ZYPREXA) 20 MG tablet Take 1 tablet (20 mg total) by mouth at bedtime. For mood control (Patient taking differently: Take 20 mg by mouth at bedtime. ) 30 tablet 0   . Omega-3 Fatty Acids (FISH OIL PO) Take 2,000 mg by mouth 2 (two) times daily.     Marland Kitchen oxyCODONE (ROXICODONE) 15 MG immediate release tablet Take 1 tablet (15 mg total) by mouth 4 (four) times daily. For chronic pain 1 tablet 0   . promethazine (PHENERGAN) 25 MG tablet Take 25 mg by mouth every 8 (eight) hours as needed for nausea or vomiting.     . Vitamin D, Ergocalciferol, (DRISDOL) 50000 units CAPS capsule Take 1 capsule (50,000 Units total) by mouth every Thursday. For calcium supplementation (Patient taking differently: Take 50,000 Units by mouth every Thursday. ) 1 capsule 0   . aspirin EC 81 MG EC tablet Take 1 tablet (81 mg total) by mouth daily. For heart health (Patient not taking: Reported on 03/03/2019)   Not Taking at Unknown time  . benztropine (COGENTIN) 0.5 MG tablet Take 1 tablet (0.5 mg total) by mouth 2 (two) times daily as needed (EPS). (Patient not taking: Reported on 12/11/2017) 60 tablet 0   . cyclobenzaprine (FLEXERIL) 10 MG tablet Take 1 tablet (10 mg total) by mouth 3 (three) times daily as needed for muscle spasms. (Patient not taking: Reported on 03/03/2019) 1 tablet  0 Not Taking at Unknown time  . hydrOXYzine (ATARAX/VISTARIL) 25 MG tablet Take 1 tablet (25 mg total) by mouth every 8 (eight) hours as needed for anxiety. For anxiety (Patient not taking: Reported on 03/03/2019) 75 tablet 0 Not Taking at Unknown time  . nicotine (NICODERM CQ - DOSED IN MG/24 HOURS) 14 mg/24hr patch Place 1 patch (14 mg total) onto the skin daily. (may purchase from over the counter): For smoking cessation (Patient not taking: Reported on 03/01/2018) 28 patch  0   . pantoprazole (PROTONIX) 40 MG tablet Take 1 tablet (40 mg total) by mouth daily at 12 noon. For acid reflux (Patient not taking: Reported on 03/03/2019)   Not Taking at Unknown time   Allergies  Allergen Reactions  . Zolpidem Tartrate Other (See Comments)    Hallucinations and sleep walks  . Lyrica [Pregabalin] Other (See Comments)    Causes extreme depression  . Demerol [Meperidine] Itching, Rash and Hives    Social History   Tobacco Use  . Smoking status: Current Some Day Smoker    Packs/day: 0.25    Years: 40.00    Pack years: 10.00    Types: Cigarettes  . Smokeless tobacco: Never Used  Substance Use Topics  . Alcohol use: No    Alcohol/week: 0.0 standard drinks    Comment: none since 1999- heavy drinker in past    Family History  Problem Relation Age of Onset  . Heart disease Mother   . Hypertension Mother   . Sudden death Mother        Presumably cardiac  . Hyperlipidemia Father   . Heart attack Father 93       At least 5 MIs. Had CABG.  . Heart failure Father   . Diabetes Father   . Hypertension Father   . Kidney disease Father   . Diabetes Brother   . Lung cancer Maternal Grandmother   . Diabetes Paternal Grandmother   . Sudden death Paternal Grandmother        Unclear etiology  . Diabetes Other   . Kidney disease Other   . Kidney disease Brother   . Hypertension Brother   . Sudden death Brother        Thought to be related to heart disease  . Throat cancer Brother   . Colon cancer  Neg Hx   . Mental illness Neg Hx      Review of Systems  Musculoskeletal: Positive for joint pain.       Right hip  All other systems reviewed and are negative.   Objective:  Physical Exam  Constitutional: He is oriented to person, place, and time. He appears well-developed and well-nourished.  HENT:  Head: Normocephalic and atraumatic.  Eyes: Pupils are equal, round, and reactive to light.  Neck: Normal range of motion.  Cardiovascular: Normal rate and regular rhythm.  Respiratory: Effort normal.  Musculoskeletal:     Comments: Right hip motion remains good but extremely painful in internal rotation.  The left leg is less painful in internal rotation.  Leg lengths are equal.  Straight leg raise causes back pain only.  Sensation is intact in his feet with palpable pulses on both sides.  He has some mild central obesity.  He walks with a markedly altered gait using a cane.  Neurological: He is alert and oriented to person, place, and time.  Skin: Skin is warm and dry.  Psychiatric: He has a normal mood and affect. His behavior is normal. Judgment and thought content normal.    Vital signs in last 24 hours:    Labs:   Estimated body mass index is 30.94 kg/m as calculated from the following:   Height as of 03/08/19: 6' (1.829 m).   Weight as of 03/08/19: 103.5 kg.   Imaging Review Plain radiographs demonstrate severe degenerative joint disease of the right hip(s). The bone quality appears to be good for age and reported activity level.   Assessment/Plan:  End stage primary arthritis, right hip(s)  The patient history, physical examination, clinical judgement of the provider and imaging studies are consistent with end stage degenerative joint disease of the right hip(s) and total hip arthroplasty is deemed medically necessary. The treatment options including medical management, injection therapy, arthroscopy and arthroplasty were discussed at length. The risks and benefits of  total hip arthroplasty were presented and reviewed. The risks due to aseptic loosening, infection, stiffness, dislocation/subluxation,  thromboembolic complications and other imponderables were discussed.  The patient acknowledged the explanation, agreed to proceed with the plan and consent was signed. Patient is being admitted for inpatient treatment for surgery, pain control, PT, OT, prophylactic antibiotics, VTE prophylaxis, progressive ambulation and ADL's and discharge planning.The patient is planning to be discharged home with home health services

## 2019-03-15 ENCOUNTER — Encounter (HOSPITAL_COMMUNITY)
Admission: RE | Disposition: A | Discharge status: Home / Self Care | Payer: Self-pay | Source: Home / Self Care | Attending: Orthopaedic Surgery

## 2019-03-15 ENCOUNTER — Observation Stay (HOSPITAL_COMMUNITY)
Admission: RE | Admit: 2019-03-15 | Discharge: 2019-03-16 | Disposition: A | Discharge status: Home / Self Care | Payer: 59 | Attending: Orthopaedic Surgery | Admitting: Orthopaedic Surgery

## 2019-03-15 ENCOUNTER — Encounter (HOSPITAL_COMMUNITY): Payer: Self-pay | Admitting: Certified Registered Nurse Anesthetist

## 2019-03-15 ENCOUNTER — Ambulatory Visit (HOSPITAL_COMMUNITY): Payer: 59 | Admitting: Physician Assistant

## 2019-03-15 ENCOUNTER — Ambulatory Visit (HOSPITAL_COMMUNITY): Payer: 59 | Admitting: Certified Registered Nurse Anesthetist

## 2019-03-15 ENCOUNTER — Ambulatory Visit (HOSPITAL_COMMUNITY): Payer: 59

## 2019-03-15 ENCOUNTER — Other Ambulatory Visit: Payer: Self-pay

## 2019-03-15 DIAGNOSIS — K589 Irritable bowel syndrome without diarrhea: Secondary | ICD-10-CM | POA: Insufficient documentation

## 2019-03-15 DIAGNOSIS — F319 Bipolar disorder, unspecified: Secondary | ICD-10-CM | POA: Diagnosis not present

## 2019-03-15 DIAGNOSIS — N189 Chronic kidney disease, unspecified: Secondary | ICD-10-CM | POA: Insufficient documentation

## 2019-03-15 DIAGNOSIS — E785 Hyperlipidemia, unspecified: Secondary | ICD-10-CM | POA: Insufficient documentation

## 2019-03-15 DIAGNOSIS — Z833 Family history of diabetes mellitus: Secondary | ICD-10-CM | POA: Insufficient documentation

## 2019-03-15 DIAGNOSIS — Z6830 Body mass index (BMI) 30.0-30.9, adult: Secondary | ICD-10-CM | POA: Diagnosis not present

## 2019-03-15 DIAGNOSIS — G894 Chronic pain syndrome: Secondary | ICD-10-CM | POA: Insufficient documentation

## 2019-03-15 DIAGNOSIS — Z885 Allergy status to narcotic agent status: Secondary | ICD-10-CM | POA: Insufficient documentation

## 2019-03-15 DIAGNOSIS — F419 Anxiety disorder, unspecified: Secondary | ICD-10-CM | POA: Insufficient documentation

## 2019-03-15 DIAGNOSIS — G4700 Insomnia, unspecified: Secondary | ICD-10-CM | POA: Diagnosis not present

## 2019-03-15 DIAGNOSIS — E669 Obesity, unspecified: Secondary | ICD-10-CM | POA: Insufficient documentation

## 2019-03-15 DIAGNOSIS — Z888 Allergy status to other drugs, medicaments and biological substances status: Secondary | ICD-10-CM | POA: Insufficient documentation

## 2019-03-15 DIAGNOSIS — R251 Tremor, unspecified: Secondary | ICD-10-CM | POA: Insufficient documentation

## 2019-03-15 DIAGNOSIS — F1210 Cannabis abuse, uncomplicated: Secondary | ICD-10-CM | POA: Diagnosis not present

## 2019-03-15 DIAGNOSIS — K219 Gastro-esophageal reflux disease without esophagitis: Secondary | ICD-10-CM | POA: Insufficient documentation

## 2019-03-15 DIAGNOSIS — E1122 Type 2 diabetes mellitus with diabetic chronic kidney disease: Secondary | ICD-10-CM | POA: Diagnosis not present

## 2019-03-15 DIAGNOSIS — M5136 Other intervertebral disc degeneration, lumbar region: Secondary | ICD-10-CM | POA: Insufficient documentation

## 2019-03-15 DIAGNOSIS — F17210 Nicotine dependence, cigarettes, uncomplicated: Secondary | ICD-10-CM | POA: Insufficient documentation

## 2019-03-15 DIAGNOSIS — Z9181 History of falling: Secondary | ICD-10-CM | POA: Diagnosis not present

## 2019-03-15 DIAGNOSIS — I25119 Atherosclerotic heart disease of native coronary artery with unspecified angina pectoris: Secondary | ICD-10-CM | POA: Insufficient documentation

## 2019-03-15 DIAGNOSIS — M797 Fibromyalgia: Secondary | ICD-10-CM | POA: Diagnosis not present

## 2019-03-15 DIAGNOSIS — E039 Hypothyroidism, unspecified: Secondary | ICD-10-CM | POA: Diagnosis not present

## 2019-03-15 DIAGNOSIS — N179 Acute kidney failure, unspecified: Secondary | ICD-10-CM | POA: Diagnosis not present

## 2019-03-15 DIAGNOSIS — M6282 Rhabdomyolysis: Secondary | ICD-10-CM | POA: Diagnosis not present

## 2019-03-15 DIAGNOSIS — M1611 Unilateral primary osteoarthritis, right hip: Secondary | ICD-10-CM

## 2019-03-15 DIAGNOSIS — Z8249 Family history of ischemic heart disease and other diseases of the circulatory system: Secondary | ICD-10-CM | POA: Insufficient documentation

## 2019-03-15 DIAGNOSIS — I129 Hypertensive chronic kidney disease with stage 1 through stage 4 chronic kidney disease, or unspecified chronic kidney disease: Secondary | ICD-10-CM | POA: Insufficient documentation

## 2019-03-15 DIAGNOSIS — Z841 Family history of disorders of kidney and ureter: Secondary | ICD-10-CM | POA: Insufficient documentation

## 2019-03-15 DIAGNOSIS — Z981 Arthrodesis status: Secondary | ICD-10-CM | POA: Insufficient documentation

## 2019-03-15 DIAGNOSIS — M87051 Idiopathic aseptic necrosis of right femur: Secondary | ICD-10-CM | POA: Diagnosis not present

## 2019-03-15 DIAGNOSIS — Z7982 Long term (current) use of aspirin: Secondary | ICD-10-CM | POA: Insufficient documentation

## 2019-03-15 DIAGNOSIS — I209 Angina pectoris, unspecified: Secondary | ICD-10-CM | POA: Diagnosis not present

## 2019-03-15 DIAGNOSIS — M8580 Other specified disorders of bone density and structure, unspecified site: Secondary | ICD-10-CM | POA: Insufficient documentation

## 2019-03-15 DIAGNOSIS — Z471 Aftercare following joint replacement surgery: Secondary | ICD-10-CM | POA: Diagnosis not present

## 2019-03-15 DIAGNOSIS — Z801 Family history of malignant neoplasm of trachea, bronchus and lung: Secondary | ICD-10-CM | POA: Insufficient documentation

## 2019-03-15 DIAGNOSIS — Z96641 Presence of right artificial hip joint: Secondary | ICD-10-CM | POA: Diagnosis not present

## 2019-03-15 DIAGNOSIS — F1410 Cocaine abuse, uncomplicated: Secondary | ICD-10-CM | POA: Diagnosis not present

## 2019-03-15 DIAGNOSIS — R000 Tachycardia, unspecified: Secondary | ICD-10-CM | POA: Insufficient documentation

## 2019-03-15 DIAGNOSIS — Z79899 Other long term (current) drug therapy: Secondary | ICD-10-CM | POA: Insufficient documentation

## 2019-03-15 DIAGNOSIS — Z800 Family history of malignant neoplasm of digestive organs: Secondary | ICD-10-CM | POA: Insufficient documentation

## 2019-03-15 DIAGNOSIS — Z419 Encounter for procedure for purposes other than remedying health state, unspecified: Secondary | ICD-10-CM

## 2019-03-15 HISTORY — PX: TOTAL HIP ARTHROPLASTY: SHX124

## 2019-03-15 HISTORY — DX: Unilateral primary osteoarthritis, right hip: M16.11

## 2019-03-15 LAB — TYPE AND SCREEN
ABO/RH(D): O POS
Antibody Screen: NEGATIVE

## 2019-03-15 SURGERY — ARTHROPLASTY, HIP, TOTAL, ANTERIOR APPROACH
Anesthesia: General | Site: Hip | Laterality: Right

## 2019-03-15 MED ORDER — BUPROPION HCL ER (XL) 150 MG PO TB24
150.0000 mg | ORAL_TABLET | Freq: Every day | ORAL | Status: DC
  Administered 2019-03-16: 150 mg via ORAL
  Filled 2019-03-15: qty 1

## 2019-03-15 MED ORDER — NICOTINE 21 MG/24HR TD PT24
21.0000 mg | MEDICATED_PATCH | Freq: Every day | TRANSDERMAL | Status: DC
  Administered 2019-03-15 – 2019-03-16 (×2): 21 mg via TRANSDERMAL
  Filled 2019-03-15 (×2): qty 1

## 2019-03-15 MED ORDER — OXYCODONE HCL 5 MG PO TABS
10.0000 mg | ORAL_TABLET | ORAL | Status: DC | PRN
  Administered 2019-03-15: 10 mg via ORAL
  Administered 2019-03-15: 15 mg via ORAL
  Administered 2019-03-15: 5 mg via ORAL
  Administered 2019-03-16 (×3): 15 mg via ORAL
  Filled 2019-03-15: qty 2
  Filled 2019-03-15 (×4): qty 3

## 2019-03-15 MED ORDER — ATENOLOL-CHLORTHALIDONE 100-25 MG PO TABS: 1.0000 | ORAL_TABLET | Freq: Every day | ORAL | Status: DC

## 2019-03-15 MED ORDER — FENTANYL CITRATE (PF) 100 MCG/2ML IJ SOLN
INTRAMUSCULAR | Status: AC
  Filled 2019-03-15: qty 2

## 2019-03-15 MED ORDER — CHLORHEXIDINE GLUCONATE 4 % EX LIQD: 60.0000 mL | Freq: Once | CUTANEOUS | Status: DC

## 2019-03-15 MED ORDER — POVIDONE-IODINE 10 % EX SWAB
2.0000 "application " | Freq: Once | CUTANEOUS | Status: AC
  Administered 2019-03-15: 2 via TOPICAL

## 2019-03-15 MED ORDER — ASPIRIN 81 MG PO CHEW
81.0000 mg | CHEWABLE_TABLET | Freq: Two times a day (BID) | ORAL | Status: DC
  Administered 2019-03-16: 81 mg via ORAL
  Filled 2019-03-15: qty 1

## 2019-03-15 MED ORDER — HYDROMORPHONE HCL 1 MG/ML IJ SOLN
INTRAMUSCULAR | Status: AC
  Filled 2019-03-15: qty 1

## 2019-03-15 MED ORDER — MIDAZOLAM HCL 2 MG/2ML IJ SOLN
INTRAMUSCULAR | Status: AC
  Filled 2019-03-15: qty 2

## 2019-03-15 MED ORDER — HYDROMORPHONE HCL 1 MG/ML IJ SOLN
0.2500 mg | INTRAMUSCULAR | Status: DC | PRN
  Administered 2019-03-15 (×4): 0.5 mg via INTRAVENOUS

## 2019-03-15 MED ORDER — LIDOCAINE 2% (20 MG/ML) 5 ML SYRINGE
INTRAMUSCULAR | Status: DC | PRN
  Administered 2019-03-15: 100 mg via INTRAVENOUS

## 2019-03-15 MED ORDER — ATENOLOL 50 MG PO TABS
100.0000 mg | ORAL_TABLET | Freq: Every day | ORAL | Status: DC
  Filled 2019-03-15: qty 2

## 2019-03-15 MED ORDER — LAMOTRIGINE 25 MG PO TABS
50.0000 mg | ORAL_TABLET | Freq: Two times a day (BID) | ORAL | Status: DC
  Administered 2019-03-15 – 2019-03-16 (×2): 50 mg via ORAL
  Filled 2019-03-15 (×2): qty 2

## 2019-03-15 MED ORDER — LACTATED RINGERS IV SOLN
INTRAVENOUS | Status: DC
  Administered 2019-03-15 (×2): via INTRAVENOUS

## 2019-03-15 MED ORDER — ONDANSETRON HCL 4 MG PO TABS: 4.0000 mg | ORAL_TABLET | Freq: Four times a day (QID) | ORAL | Status: DC | PRN

## 2019-03-15 MED ORDER — METHOCARBAMOL 500 MG IVPB - SIMPLE MED
INTRAVENOUS | Status: AC
  Filled 2019-03-15: qty 50

## 2019-03-15 MED ORDER — AMITRIPTYLINE HCL 50 MG PO TABS
75.0000 mg | ORAL_TABLET | Freq: Every day | ORAL | Status: DC
  Administered 2019-03-15: 75 mg via ORAL
  Filled 2019-03-15: qty 1

## 2019-03-15 MED ORDER — OXYCODONE HCL 5 MG PO TABS
5.0000 mg | ORAL_TABLET | ORAL | Status: DC | PRN
  Filled 2019-03-15: qty 1

## 2019-03-15 MED ORDER — DEXAMETHASONE SODIUM PHOSPHATE 4 MG/ML IJ SOLN
INTRAMUSCULAR | Status: DC | PRN
  Administered 2019-03-15: 10 mg via INTRAVENOUS

## 2019-03-15 MED ORDER — TRANEXAMIC ACID-NACL 1000-0.7 MG/100ML-% IV SOLN
1000.0000 mg | INTRAVENOUS | Status: AC
  Administered 2019-03-15: 13:00:00 1000 mg via INTRAVENOUS
  Filled 2019-03-15: qty 100

## 2019-03-15 MED ORDER — LACTATED RINGERS IV SOLN: INTRAVENOUS | Status: DC

## 2019-03-15 MED ORDER — SUGAMMADEX SODIUM 200 MG/2ML IV SOLN
INTRAVENOUS | Status: DC | PRN
  Administered 2019-03-15: 200 mg via INTRAVENOUS

## 2019-03-15 MED ORDER — HYDROMORPHONE HCL 1 MG/ML IJ SOLN
0.5000 mg | INTRAMUSCULAR | Status: DC | PRN
  Administered 2019-03-15: 1 mg via INTRAVENOUS
  Administered 2019-03-15: 0.5 mg via INTRAVENOUS
  Filled 2019-03-15 (×2): qty 1

## 2019-03-15 MED ORDER — CEFAZOLIN SODIUM-DEXTROSE 2-4 GM/100ML-% IV SOLN
2.0000 g | Freq: Four times a day (QID) | INTRAVENOUS | Status: AC
  Administered 2019-03-15 – 2019-03-16 (×2): 2 g via INTRAVENOUS
  Filled 2019-03-15 (×2): qty 100

## 2019-03-15 MED ORDER — METOCLOPRAMIDE HCL 5 MG/ML IJ SOLN: 5.0000 mg | Freq: Three times a day (TID) | INTRAMUSCULAR | Status: DC | PRN

## 2019-03-15 MED ORDER — ONDANSETRON HCL 4 MG/2ML IJ SOLN
INTRAMUSCULAR | Status: DC | PRN
  Administered 2019-03-15: 4 mg via INTRAVENOUS

## 2019-03-15 MED ORDER — LIDOCAINE 2% (20 MG/ML) 5 ML SYRINGE
INTRAMUSCULAR | Status: AC
  Filled 2019-03-15: qty 5

## 2019-03-15 MED ORDER — ALUM & MAG HYDROXIDE-SIMETH 200-200-20 MG/5ML PO SUSP
30.0000 mL | ORAL | Status: DC | PRN
  Administered 2019-03-16: 30 mL via ORAL
  Filled 2019-03-15: qty 30

## 2019-03-15 MED ORDER — MIDAZOLAM HCL 5 MG/5ML IJ SOLN
INTRAMUSCULAR | Status: DC | PRN
  Administered 2019-03-15: 2 mg via INTRAVENOUS

## 2019-03-15 MED ORDER — LEVOTHYROXINE SODIUM 50 MCG PO TABS
50.0000 ug | ORAL_TABLET | Freq: Every day | ORAL | Status: DC
  Administered 2019-03-16: 50 ug via ORAL
  Filled 2019-03-15: qty 1

## 2019-03-15 MED ORDER — PROMETHAZINE HCL 25 MG PO TABS: 25.0000 mg | ORAL_TABLET | Freq: Three times a day (TID) | ORAL | Status: DC | PRN

## 2019-03-15 MED ORDER — NITROGLYCERIN 0.4 MG SL SUBL: 0.4000 mg | SUBLINGUAL_TABLET | SUBLINGUAL | Status: DC | PRN

## 2019-03-15 MED ORDER — METHOCARBAMOL 500 MG PO TABS
500.0000 mg | ORAL_TABLET | Freq: Four times a day (QID) | ORAL | Status: DC | PRN
  Administered 2019-03-15: 500 mg via ORAL
  Filled 2019-03-15: qty 1

## 2019-03-15 MED ORDER — METOCLOPRAMIDE HCL 5 MG PO TABS: 5.0000 mg | ORAL_TABLET | Freq: Three times a day (TID) | ORAL | Status: DC | PRN

## 2019-03-15 MED ORDER — METHOCARBAMOL 500 MG IVPB - SIMPLE MED
500.0000 mg | Freq: Four times a day (QID) | INTRAVENOUS | Status: DC | PRN
  Administered 2019-03-15: 500 mg via INTRAVENOUS
  Filled 2019-03-15: qty 50

## 2019-03-15 MED ORDER — BUPIVACAINE-EPINEPHRINE 0.5% -1:200000 IJ SOLN
INTRAMUSCULAR | Status: AC
  Filled 2019-03-15: qty 1

## 2019-03-15 MED ORDER — ACETAMINOPHEN 10 MG/ML IV SOLN: 1000.0000 mg | Freq: Once | INTRAVENOUS | Status: DC | PRN

## 2019-03-15 MED ORDER — ONDANSETRON HCL 4 MG/2ML IJ SOLN: 4.0000 mg | Freq: Four times a day (QID) | INTRAMUSCULAR | Status: DC | PRN

## 2019-03-15 MED ORDER — BISACODYL 5 MG PO TBEC: 5.0000 mg | DELAYED_RELEASE_TABLET | Freq: Every day | ORAL | Status: DC | PRN

## 2019-03-15 MED ORDER — ROCURONIUM BROMIDE 10 MG/ML (PF) SYRINGE
PREFILLED_SYRINGE | INTRAVENOUS | Status: DC | PRN
  Administered 2019-03-15: 60 mg via INTRAVENOUS
  Administered 2019-03-15: 20 mg via INTRAVENOUS

## 2019-03-15 MED ORDER — DOCUSATE SODIUM 100 MG PO CAPS
100.0000 mg | ORAL_CAPSULE | Freq: Two times a day (BID) | ORAL | Status: DC
  Administered 2019-03-15 – 2019-03-16 (×2): 100 mg via ORAL
  Filled 2019-03-15 (×2): qty 1

## 2019-03-15 MED ORDER — CEFAZOLIN SODIUM-DEXTROSE 2-4 GM/100ML-% IV SOLN
2.0000 g | INTRAVENOUS | Status: AC
  Administered 2019-03-15: 12:00:00 2 g via INTRAVENOUS
  Filled 2019-03-15: qty 100

## 2019-03-15 MED ORDER — TRANEXAMIC ACID 1000 MG/10ML IV SOLN
INTRAVENOUS | Status: DC | PRN
  Administered 2019-03-15: 2000 mg via TOPICAL

## 2019-03-15 MED ORDER — HYDROXYZINE HCL 25 MG PO TABS
50.0000 mg | ORAL_TABLET | Freq: Three times a day (TID) | ORAL | Status: DC | PRN
  Administered 2019-03-15: 50 mg via ORAL
  Filled 2019-03-15: qty 2

## 2019-03-15 MED ORDER — MENTHOL 3 MG MT LOZG: 1.0000 | LOZENGE | OROMUCOSAL | Status: DC | PRN

## 2019-03-15 MED ORDER — BUPIVACAINE LIPOSOME 1.3 % IJ SUSP
INTRAMUSCULAR | Status: DC | PRN
  Administered 2019-03-15: 10 mL

## 2019-03-15 MED ORDER — TRANEXAMIC ACID-NACL 1000-0.7 MG/100ML-% IV SOLN
1000.0000 mg | Freq: Once | INTRAVENOUS | Status: AC
  Administered 2019-03-15: 1000 mg via INTRAVENOUS
  Filled 2019-03-15: qty 100

## 2019-03-15 MED ORDER — CHLORTHALIDONE 25 MG PO TABS
25.0000 mg | ORAL_TABLET | Freq: Every day | ORAL | Status: DC
  Administered 2019-03-16: 25 mg via ORAL
  Filled 2019-03-15: qty 1

## 2019-03-15 MED ORDER — 0.9 % SODIUM CHLORIDE (POUR BTL) OPTIME
TOPICAL | Status: DC | PRN
  Administered 2019-03-15: 1000 mL

## 2019-03-15 MED ORDER — NICOTINE POLACRILEX 4 MG MT LOZG
4.0000 mg | LOZENGE | OROMUCOSAL | Status: DC | PRN
  Filled 2019-03-15: qty 1

## 2019-03-15 MED ORDER — ACETAMINOPHEN 500 MG PO TABS
1000.0000 mg | ORAL_TABLET | Freq: Four times a day (QID) | ORAL | Status: DC
  Administered 2019-03-15 – 2019-03-16 (×3): 1000 mg via ORAL
  Filled 2019-03-15 (×3): qty 2

## 2019-03-15 MED ORDER — KETOROLAC TROMETHAMINE 15 MG/ML IJ SOLN
15.0000 mg | Freq: Four times a day (QID) | INTRAMUSCULAR | Status: DC
  Administered 2019-03-15 – 2019-03-16 (×3): 15 mg via INTRAVENOUS
  Filled 2019-03-15 (×3): qty 1

## 2019-03-15 MED ORDER — BUSPIRONE HCL 5 MG PO TABS
15.0000 mg | ORAL_TABLET | Freq: Two times a day (BID) | ORAL | Status: DC
  Administered 2019-03-15 – 2019-03-16 (×2): 15 mg via ORAL
  Filled 2019-03-15 (×2): qty 3

## 2019-03-15 MED ORDER — OLANZAPINE 10 MG PO TABS
20.0000 mg | ORAL_TABLET | Freq: Every day | ORAL | Status: DC
  Administered 2019-03-15: 20 mg via ORAL
  Filled 2019-03-15: qty 2

## 2019-03-15 MED ORDER — PANTOPRAZOLE SODIUM 40 MG PO TBEC: 80.0000 mg | DELAYED_RELEASE_TABLET | Freq: Every day | ORAL | Status: DC

## 2019-03-15 MED ORDER — PROPOFOL 10 MG/ML IV BOLUS
INTRAVENOUS | Status: AC
  Filled 2019-03-15: qty 20

## 2019-03-15 MED ORDER — PROPOFOL 10 MG/ML IV BOLUS
INTRAVENOUS | Status: DC | PRN
  Administered 2019-03-15: 200 mg via INTRAVENOUS

## 2019-03-15 MED ORDER — STERILE WATER FOR IRRIGATION IR SOLN
Status: DC | PRN
  Administered 2019-03-15: 1000 mL

## 2019-03-15 MED ORDER — FENTANYL CITRATE (PF) 100 MCG/2ML IJ SOLN
INTRAMUSCULAR | Status: DC | PRN
  Administered 2019-03-15: 100 ug via INTRAVENOUS
  Administered 2019-03-15 (×2): 50 ug via INTRAVENOUS
  Administered 2019-03-15: 100 ug via INTRAVENOUS

## 2019-03-15 MED ORDER — BUPIVACAINE-EPINEPHRINE 0.5% -1:200000 IJ SOLN
INTRAMUSCULAR | Status: DC | PRN
  Administered 2019-03-15: 30 mL

## 2019-03-15 MED ORDER — ACETAMINOPHEN 325 MG PO TABS: 325.0000 mg | ORAL_TABLET | Freq: Four times a day (QID) | ORAL | Status: DC | PRN

## 2019-03-15 MED ORDER — PROMETHAZINE HCL 25 MG/ML IJ SOLN: 6.2500 mg | INTRAMUSCULAR | Status: DC | PRN

## 2019-03-15 MED ORDER — PHENOL 1.4 % MT LIQD: 1.0000 | OROMUCOSAL | Status: DC | PRN

## 2019-03-15 MED ORDER — AMANTADINE HCL 50 MG/5ML PO SYRP
50.0000 mg | ORAL_SOLUTION | Freq: Two times a day (BID) | ORAL | Status: DC
  Administered 2019-03-15 – 2019-03-16 (×2): 50 mg via ORAL
  Filled 2019-03-15 (×2): qty 5

## 2019-03-15 MED ORDER — DIPHENHYDRAMINE HCL 12.5 MG/5ML PO ELIX: 12.5000 mg | ORAL_SOLUTION | ORAL | Status: DC | PRN

## 2019-03-15 SURGICAL SUPPLY — 43 items
BAG DECANTER FOR FLEXI CONT (MISCELLANEOUS) ×3 IMPLANT
BLADE SAW SGTL 18X1.27X75 (BLADE) ×2 IMPLANT
BLADE SAW SGTL 18X1.27X75MM (BLADE) ×1
BOOTIES KNEE HIGH SLOAN (MISCELLANEOUS) ×3 IMPLANT
CELLS DAT CNTRL 66122 CELL SVR (MISCELLANEOUS) ×1 IMPLANT
COVER PERINEAL POST (MISCELLANEOUS) ×3 IMPLANT
COVER SURGICAL LIGHT HANDLE (MISCELLANEOUS) ×3 IMPLANT
COVER WAND RF STERILE (DRAPES) IMPLANT
CUP PINN GRIPTON 54 100 (Cup) ×3 IMPLANT
DECANTER SPIKE VIAL GLASS SM (MISCELLANEOUS) ×3 IMPLANT
DRAPE IMP U-DRAPE 54X76 (DRAPES) ×3 IMPLANT
DRAPE STERI IOBAN 125X83 (DRAPES) ×3 IMPLANT
DRAPE U-SHAPE 47X51 STRL (DRAPES) ×6 IMPLANT
DRSG AQUACEL AG ADV 3.5X 6 (GAUZE/BANDAGES/DRESSINGS) ×3 IMPLANT
DURAPREP 26ML APPLICATOR (WOUND CARE) ×3 IMPLANT
ELECT BLADE TIP CTD 4 INCH (ELECTRODE) ×3 IMPLANT
ELECT REM PT RETURN 15FT ADLT (MISCELLANEOUS) ×3 IMPLANT
ELIMINATOR HOLE APEX DEPUY (Hips) ×3 IMPLANT
GLOVE BIO SURGEON STRL SZ8 (GLOVE) ×6 IMPLANT
GLOVE BIOGEL PI IND STRL 8 (GLOVE) ×2 IMPLANT
GLOVE BIOGEL PI INDICATOR 8 (GLOVE) ×4
GOWN STRL REUS W/TWL XL LVL3 (GOWN DISPOSABLE) ×6 IMPLANT
HEAD CERAMIC DELTA 36 PLUS 1.5 (Hips) ×3 IMPLANT
HOLDER FOLEY CATH W/STRAP (MISCELLANEOUS) ×3 IMPLANT
KIT TURNOVER KIT A (KITS) IMPLANT
LINER NEUTRAL 36ID 54OD (Liner) ×3 IMPLANT
MANIFOLD NEPTUNE II (INSTRUMENTS) ×3 IMPLANT
NEEDLE HYPO 22GX1.5 SAFETY (NEEDLE) ×3 IMPLANT
NS IRRIG 1000ML POUR BTL (IV SOLUTION) ×3 IMPLANT
PACK ANTERIOR HIP CUSTOM (KITS) ×3 IMPLANT
PROTECTOR NERVE ULNAR (MISCELLANEOUS) ×3 IMPLANT
RTRCTR WOUND ALEXIS 18CM MED (MISCELLANEOUS) ×3
STEM FEMORAL SZ 6MM STD ACTIS (Stem) ×3 IMPLANT
SUT ETHIBOND NAB CT1 #1 30IN (SUTURE) ×6 IMPLANT
SUT VIC AB 1 CT1 36 (SUTURE) ×3 IMPLANT
SUT VIC AB 2-0 CT1 27 (SUTURE) ×2
SUT VIC AB 2-0 CT1 TAPERPNT 27 (SUTURE) ×1 IMPLANT
SUT VIC AB 3-0 PS2 18 (SUTURE) ×2
SUT VIC AB 3-0 PS2 18XBRD (SUTURE) ×1 IMPLANT
SUT VLOC 180 0 24IN GS25 (SUTURE) ×3 IMPLANT
SYR 50ML LL SCALE MARK (SYRINGE) ×3 IMPLANT
TRAY FOLEY MTR SLVR 16FR STAT (SET/KITS/TRAYS/PACK) ×3 IMPLANT
YANKAUER SUCT BULB TIP 10FT TU (MISCELLANEOUS) ×3 IMPLANT

## 2019-03-15 NOTE — Progress Notes (Addendum)
Dr. Jillyn Hidden notified that pt has neurostimulator.  Also notified him that pt takes atenolol-chlorthalidone daily and did not take this morning (last dose yesterday 0600). Based on vital signs 109/74 and 53 pulse, will not give pt this medication today.

## 2019-03-15 NOTE — Op Note (Signed)
PRE-OP DIAGNOSIS:  RIGHT HIP DEGENERATIVE JOINT DISEASE POST-OP DIAGNOSIS:  same PROCEDURE: RIGHT TOTAL HIP ARTHROPLASTY ANTERIOR APPROACH ANESTHESIA:  General SURGEON:  Melrose Nakayama MD ASSISTANT:  Loni Dolly PA-C   INDICATIONS FOR PROCEDURE:  The patient is a 53 y.o. male with a long history of a painful hip.  This has persisted despite multiple conservative measures.  The patient has persisted with pain and dysfunction making rest and activity difficult.  A total hip replacement is offered as surgical treatment.  Informed operative consent was obtained after discussion of possible complications including reaction to anesthesia, infection, neurovascular injury, dislocation, DVT, PE, and death.  The importance of the postoperative rehab program to optimize result was stressed with the patient.  SUMMARY OF FINDINGS AND PROCEDURE:  Under the above anesthesia through a anterior approach an the Hana table a right THR was performed.  The patient had severe degenerative change and excellent bone quality.  We used DePuy components to replace the hip and these were size 6 standard Actis femur capped with a +1.5 57mm ceramic hip ball.  On the acetabular side we used a size 54 Gription shell with a  plus 0 neutral polyethylene liner.  We did use a hole eliminator.  Loni Dolly PA-C assisted throughout and was invaluable to the completion of the case in that he helped position and retract while I performed the procedure.  He also closed simultaneously to help minimize OR time.  I used fluoroscopy throughout the case to check position of implants and leg lengths and read all of these views myself.  DESCRIPTION OF PROCEDURE:  The patient was taken to the OR suite where the above anesthetic was applied.  The patient was then positioned on the Hana table supine.  All bony prominences were appropriately padded.  Prep and drape was then performed in normal sterile fashion.  The patient was given kefzol preoperative  antibiotic and an appropriate time out was performed.  We then took an anterior approach to the right hip.  Dissection was taken through adipose to the tensor fascia lata fascia.  This structure was incised longitudinally and we dissected in the intermuscular interval just medial to this muscle.  Cobra retractors were placed superior and inferior to the femoral neck superficial to the capsule.  A capsular incision was then made and the retractors were placed along the femoral neck.  Xray was brought in to get a good level for the femoral neck cut which was made with an oscillating saw and osteotome.  The femoral head was removed with a corkscrew.  The acetabulum was exposed and some labral tissues were excised. Reaming was taken to the inside wall of the pelvis and sequentially up to 1 mm smaller than the actual component.  A trial of components was done and then the aforementioned acetabular shell was placed in appropriate tilt and anteversion confirmed by fluoroscopy. The liner was placed along with the hole eliminator and attention was turned to the femur.  The leg was brought down and over into adduction and the elevator bar was used to raise the femur up gently in the wound.  The piriformis was released with care taken to preserve the obturator internus attachment and all of the posterior capsule. The femur was reamed and then broached to the appropriate size.  A trial reduction was done and the aforementioned head and neck assembly gave [place] the best stability in extension with external rotation.  Leg lengths were felt to be about equal by  fluoroscopic exam.  The trial components were removed and the wound irrigated.  We then placed the femoral component in appropriate anteversion.  The head was applied to a dry stem neck and the hip again reduced.  It was again stable in the aforementioned position.  The would was irrigated again followed by re-approximation of anterior capsule with ethibond suture. Tensor  fascia was repaired with V-loc suture  followed by deep closure with #O and #2 undyed vicryl.  Skin was closed with subQ stitch and steristrips followed by a sterile dressing.  EBL and IOF can be obtained from anesthesia records.  DISPOSITION:  The patient was extubated in the OR and taken to PACU in stable condition to be admitted to the Orthopedic Surgery for appropriate post-op care to include perioperative antibiotics and DVT prophylaxis.

## 2019-03-15 NOTE — Plan of Care (Signed)

## 2019-03-15 NOTE — Interval H&P Note (Signed)
History and Physical Interval Note:  03/15/2019 11:24 AM  [name]  has presented today for surgery, with the diagnosis of RIGHT HIP AVASCULAR NECROSIS.  The various methods of treatment have been discussed with the patient and family. After consideration of risks, benefits and other options for treatment, the patient has consented to  Procedure(s): RIGHT TOTAL HIP ARTHROPLASTY ANTERIOR APPROACH (Right) as a surgical intervention.  The patient's history has been reviewed, patient examined, no change in status, stable for surgery.  I have reviewed the patient's chart and labs.  Questions were answered to the patient's satisfaction.     Hessie Dibble

## 2019-03-15 NOTE — Plan of Care (Signed)
Plan of care reviewed and discussed with the patient. 

## 2019-03-15 NOTE — Evaluation (Signed)
Physical Therapy Evaluation Patient Details Name: [name] MRN: [id number] DOB: 03-Feb-1966 Today's Date: 03/15/2019   History of Present Illness  R DA-THA; PMH of head injury, bipolar d/o, anxiety, fibromyalgia, spinal cord stimulator, cervial fusions, tremors at baseline, cocaine and marijuana abuse  Clinical Impression  Pt is s/p THA resulting in the deficits listed below (see PT Problem List). Pt ambulated 11' with RW, no loss of balance. Initiated THA HEP. Good progress expected. Pt will benefit from skilled PT to increase their independence and safety with mobility to allow discharge to the venue listed below.      Follow Up Recommendations Follow surgeon's recommendation for DC plan and follow-up therapies    Equipment Recommendations  None recommended by PT    Recommendations for Other Services       Precautions / Restrictions Precautions Precautions: Fall Precaution Comments: pt reports h/o 1 fall in past 1 year (RLE "gave out") Restrictions Weight Bearing Restrictions: No      Mobility  Bed Mobility Overal bed mobility: Needs Assistance Bed Mobility: Supine to Sit     Supine to sit: Min assist     General bed mobility comments: min A to support RLE to edge of bed  Transfers Overall transfer level: Needs assistance Equipment used: Rolling walker (2 wheeled) Transfers: Sit to/from Stand Sit to Stand: Min assist;From elevated surface         General transfer comment: assist to rise, VCs hand placment  Ambulation/Gait Ambulation/Gait assistance: Min guard Gait Distance (Feet): 45 Feet Assistive device: Rolling walker (2 wheeled) Gait Pattern/deviations: Step-to pattern;Decreased stride length;Decreased weight shift to right Gait velocity: decr   General Gait Details: distance limited by R hip pain, VCs sequencing, no loss of balance  Stairs            Wheelchair Mobility    Modified Rankin (Stroke Patients Only)       Balance  Overall balance assessment: Modified Independent;History of Falls                                           Pertinent Vitals/Pain Pain Assessment: 0-10 Pain Score: 5  Pain Location: R hip with walking Pain Descriptors / Indicators: Sore    Home Living Family/patient expects to be discharged to:: Private residence Living Arrangements: Alone Available Help at Discharge: Personal care attendant Type of Home: Apartment Home Access: Level entry     Home Layout: One level Home Equipment: Environmental consultant - 2 wheels;Cane - single point;Bedside commode      Prior Function Level of Independence: Independent with assistive device(s)         Comments: used cane; had home health aide to help with cooking/cleaning 3x/week for 2 hours, pt stated that will increase to 4 hours upon DC from hospital     Hand Dominance        Extremity/Trunk Assessment   Upper Extremity Assessment Upper Extremity Assessment: Overall WFL for tasks assessed    Lower Extremity Assessment Lower Extremity Assessment: RLE deficits/detail RLE Deficits / Details: R hip ~2/5, AAROM decreased ~50% 2* pain RLE Sensation: WNL RLE Coordination: WNL    Cervical / Trunk Assessment Cervical / Trunk Assessment: Normal  Communication   Communication: No difficulties  Cognition Arousal/Alertness: Awake/alert Behavior During Therapy: WFL for tasks assessed/performed Overall Cognitive Status: Within Functional Limits for tasks assessed  General Comments      Exercises Total Joint Exercises Ankle Circles/Pumps: AROM;Both;10 reps;Supine Heel Slides: AAROM;Right;10 reps;Supine Hip ABduction/ADduction: AAROM;Right;10 reps;Supine   Assessment/Plan    PT Assessment Patient needs continued PT services  PT Problem List Decreased strength;Decreased range of motion;Decreased activity tolerance;Decreased mobility;Pain       PT Treatment  Interventions DME instruction;Gait training;Therapeutic exercise;Therapeutic activities;Patient/family education    PT Goals (Current goals can be found in the Care Plan section)  Acute Rehab PT Goals Patient Stated Goal: be able to go for long walks PT Goal Formulation: With patient Time For Goal Achievement: 03/22/19 Potential to Achieve Goals: Good    Frequency 7X/week   Barriers to discharge        Co-evaluation               AM-PAC PT "6 Clicks" Mobility  Outcome Measure Help needed turning from your back to your side while in a flat bed without using bedrails?: A Little Help needed moving from lying on your back to sitting on the side of a flat bed without using bedrails?: A Little Help needed moving to and from a bed to a chair (including a wheelchair)?: A Little Help needed standing up from a chair using your arms (e.g., wheelchair or bedside chair)?: A Little Help needed to walk in hospital room?: A Little Help needed climbing 3-5 steps with a railing? : A Lot 6 Click Score: 17    End of Session Equipment Utilized During Treatment: Gait belt Activity Tolerance: Patient limited by pain;Patient tolerated treatment well Patient left: in chair;with call bell/phone within reach;with chair alarm set Nurse Communication: Mobility status PT Visit Diagnosis: Muscle weakness (generalized) (M62.81);Difficulty in walking, not elsewhere classified (R26.2);Pain Pain - Right/Left: Right Pain - part of body: Hip    Time: [id number] PT Time Calculation (min) (ACUTE ONLY): 17 min   Charges:   PT Evaluation $PT Eval Low Complexity: 1 Low         Philomena Doheny PT 03/15/2019  Acute Rehabilitation Services Pager [phone] Office [phone]

## 2019-03-15 NOTE — Transfer of Care (Signed)
Immediate Anesthesia Transfer of Care Note  Patient: [name]  Procedure(s) Performed: RIGHT TOTAL HIP ARTHROPLASTY ANTERIOR APPROACH (Right Hip)  Patient Location: PACU  Anesthesia Type:General  Level of Consciousness: drowsy  Airway & Oxygen Therapy: Patient Spontanous Breathing and Patient connected to face mask  Post-op Assessment: Report given to RN and Post -op Vital signs reviewed and stable  Post vital signs: Reviewed and stable  Last Vitals:  Vitals Value Taken Time  BP 128/86 03/15/19 1400  Temp    Pulse 73 03/15/19 1402  Resp 14 03/15/19 1402  SpO2 100 % 03/15/19 1402  Vitals shown include unvalidated device data.  Last Pain:  Vitals:   03/15/19 1046  TempSrc:   PainSc: 4       Patients Stated Pain Goal: 4 ([id number])  Complications: No apparent anesthesia complications

## 2019-03-15 NOTE — Anesthesia Procedure Notes (Signed)
Procedure Name: Intubation Date/Time: 03/15/2019 12:25 PM Performed by: Claudia Desanctis, CRNA Pre-anesthesia Checklist: Patient identified, Emergency Drugs available, Suction available and Patient being monitored Patient Re-evaluated:Patient Re-evaluated prior to induction Oxygen Delivery Method: Circle system utilized Preoxygenation: Pre-oxygenation with 100% oxygen Induction Type: IV induction Ventilation: Mask ventilation without difficulty Laryngoscope Size: 2 and Miller Grade View: Grade I Tube type: Oral Tube size: 7.5 mm Number of attempts: 1 Airway Equipment and Method: Stylet Placement Confirmation: ETT inserted through vocal cords under direct vision,  positive ETCO2 and breath sounds checked- equal and bilateral Secured at: 22 cm Tube secured with: Tape Dental Injury: Teeth and Oropharynx as per pre-operative assessment

## 2019-03-16 ENCOUNTER — Encounter (HOSPITAL_COMMUNITY): Payer: Self-pay | Admitting: Orthopaedic Surgery

## 2019-03-16 DIAGNOSIS — R000 Tachycardia, unspecified: Secondary | ICD-10-CM | POA: Diagnosis not present

## 2019-03-16 DIAGNOSIS — M6282 Rhabdomyolysis: Secondary | ICD-10-CM | POA: Diagnosis not present

## 2019-03-16 DIAGNOSIS — I129 Hypertensive chronic kidney disease with stage 1 through stage 4 chronic kidney disease, or unspecified chronic kidney disease: Secondary | ICD-10-CM | POA: Diagnosis not present

## 2019-03-16 DIAGNOSIS — G894 Chronic pain syndrome: Secondary | ICD-10-CM | POA: Diagnosis not present

## 2019-03-16 DIAGNOSIS — F319 Bipolar disorder, unspecified: Secondary | ICD-10-CM | POA: Diagnosis not present

## 2019-03-16 DIAGNOSIS — E1122 Type 2 diabetes mellitus with diabetic chronic kidney disease: Secondary | ICD-10-CM | POA: Diagnosis not present

## 2019-03-16 DIAGNOSIS — M1611 Unilateral primary osteoarthritis, right hip: Secondary | ICD-10-CM | POA: Diagnosis not present

## 2019-03-16 DIAGNOSIS — F1210 Cannabis abuse, uncomplicated: Secondary | ICD-10-CM | POA: Diagnosis not present

## 2019-03-16 DIAGNOSIS — F1410 Cocaine abuse, uncomplicated: Secondary | ICD-10-CM | POA: Diagnosis not present

## 2019-03-16 MED ORDER — TIZANIDINE HCL 4 MG PO TABS: 4.0000 mg | ORAL_TABLET | Freq: Four times a day (QID) | ORAL | 1 refills | Status: AC | PRN

## 2019-03-16 MED ORDER — ASPIRIN 81 MG PO TBEC: 81.0000 mg | DELAYED_RELEASE_TABLET | Freq: Two times a day (BID) | ORAL | 0 refills | Status: DC

## 2019-03-16 MED ORDER — OXYCODONE HCL 5 MG PO TABS: 5.0000 mg | ORAL_TABLET | Freq: Four times a day (QID) | ORAL | 0 refills | Status: DC | PRN

## 2019-03-16 NOTE — Discharge Summary (Signed)
Patient ID: [name] MRN: [id number] DOB/AGE: 1966-01-27 53 y.o.  Admit date: 03/15/2019 Discharge date: 03/16/2019  Admission Diagnoses:  Principal Problem:   Primary localized osteoarthritis of right hip Active Problems:   Primary osteoarthritis of right hip   Discharge Diagnoses:  Same  Past Medical History:  Diagnosis Date  . Accelerated hypertension 08/23/2016   pt denies this  . Anxiety   . Arthritis   . Bipolar affective disorder ([place])   . Chronic back pain   . Chronic kidney disease 2017   AKF - due to Rhabdomyolysis  . Coronary artery disease    minimal Mid LAD to Dist LAD lesion, 35% stenosed. Prox Cx lesion, 40% stenosed.  . DDD (degenerative disc disease)   . Depression   . DJD (degenerative joint disease)   . Elevated liver function tests   . Fibromyalgia   . GERD (gastroesophageal reflux disease)   . Head injury   . Head injury, closed, with concussion    x 3  from falls  . Hyperlipidemia   . Hypothyroidism   . IBS (irritable bowel syndrome)    with diarrhea  . Inguinal hernia    left  . Insomnia   . Myofascial pain syndrome   . Osteopenia   . Pneumonia   . Rhabdomyolysis   . Shortness of breath dyspnea    with exertion  . Tibial plateau fracture, left   . Tremors of nervous system     Surgeries: Procedure(s): RIGHT TOTAL HIP ARTHROPLASTY ANTERIOR APPROACH on 03/15/2019   Consultants:   Discharged Condition: Improved  Hospital Course: [name] is an 53 y.o. male who was admitted 03/15/2019 for operative treatment ofPrimary localized osteoarthritis of right hip. Patient has severe unremitting pain that affects sleep, daily activities, and work/hobbies. After pre-op clearance the patient was taken to the operating room on 03/15/2019 and underwent  Procedure(s): RIGHT TOTAL HIP ARTHROPLASTY ANTERIOR APPROACH.    Patient was given perioperative antibiotics:  Anti-infectives (From admission, onward)   Start     Dose/Rate Route  Frequency Ordered Stop   03/15/19 1830  ceFAZolin (ANCEF) IVPB 2g/100 mL premix     2 g 200 mL/hr over 30 Minutes Intravenous Every 6 hours 03/15/19 1527 03/16/19 0059   03/15/19 1030  ceFAZolin (ANCEF) IVPB 2g/100 mL premix     2 g 200 mL/hr over 30 Minutes Intravenous On call to O.R. 03/15/19 1018 03/15/19 1237       Patient was given sequential compression devices, early ambulation, and chemoprophylaxis to prevent DVT.  Patient benefited maximally from hospital stay and there were no complications.    Recent vital signs:  Patient Vitals for the past 24 hrs:  BP Temp Temp src Pulse Resp SpO2 Height Weight  03/16/19 0554 (!) 88/57 - - 62 - 95 % - -  03/16/19 0552 (!) 133/116 97.8 F (36.6 C) Oral 62 14 96 % - -  03/16/19 0134 (!) 95/53 97.9 F (36.6 C) Oral 64 14 97 % - -  03/15/19 2133 102/65 98.1 F (36.7 C) Oral 72 14 97 % - -  03/15/19 2000 - - - 68 16 - - -  03/15/19 1839 94/60 98 F (36.7 C) - 69 16 95 % - -  03/15/19 1714 115/89 97.7 F (36.5 C) Oral 67 16 98 % - -  03/15/19 1530 114/70 98 F (36.7 C) Oral 66 18 97 % - -  03/15/19 1515 114/67 98.6 F (37 C) - 63 13 97 % - -  03/15/19 1500 (!) 105/56 - - 65 15 97 % - -  03/15/19 1445 113/63 - - 67 13 97 % - -  03/15/19 1430 121/79 - - 65 12 98 % - -  03/15/19 1415 116/76 - - 66 14 100 % - -  03/15/19 1400 128/86 98.3 F (36.8 C) - 70 11 100 % - -  03/15/19 1046 - - - - - - 6' (1.829 m) 103.5 kg  03/15/19 1037 109/74 98.4 F (36.9 C) Oral (!) 53 18 97 % - -     Recent laboratory studies: No results for input(s): WBC, HGB, HCT, PLT, NA, K, CL, CO2, BUN, CREATININE, GLUCOSE, INR, CALCIUM in the last 72 hours.  Invalid input(s): PT, 2   Discharge Medications:   Allergies as of 03/16/2019      Reactions   Zolpidem Tartrate Other (See Comments)   Hallucinations and sleep walks   Lyrica [pregabalin] Other (See Comments)   Causes extreme depression   Demerol [meperidine] Itching, Rash, Hives      Medication  List    TAKE these medications   amantadine 100 MG capsule Commonly known as: SYMMETREL Take 50 mg by mouth 2 (two) times daily.   amitriptyline 25 MG tablet Commonly known as: ELAVIL Take 3 tablets (75 mg total) by mouth at bedtime. For insomnia/depression What changed: additional instructions   aspirin 81 MG EC tablet Take 1 tablet (81 mg total) by mouth 2 (two) times daily after a meal. For heart health What changed: when to take this   atenolol-chlorthalidone 100-25 MG tablet Commonly known as: TENORETIC Take 1 tablet by mouth daily.   benztropine 0.5 MG tablet Commonly known as: COGENTIN Take 1 tablet (0.5 mg total) by mouth 2 (two) times daily as needed (EPS).   buPROPion 150 MG 24 hr tablet Commonly known as: WELLBUTRIN XL Take 1 tablet (150 mg total) by mouth daily. For depression What changed: additional instructions   busPIRone 15 MG tablet Commonly known as: BUSPAR Take 1 tablet (15 mg total) by mouth 2 (two) times daily. For anxiety What changed: additional instructions   cyclobenzaprine 10 MG tablet Commonly known as: FLEXERIL Take 1 tablet (10 mg total) by mouth 3 (three) times daily as needed for muscle spasms.   esomeprazole 40 MG capsule Commonly known as: NEXIUM Take 40 mg by mouth daily at 12 noon.   FISH OIL PO Take 2,000 mg by mouth 2 (two) times daily.   hydrOXYzine 50 MG tablet Commonly known as: ATARAX/VISTARIL Take 50 mg by mouth 3 (three) times daily as needed for anxiety.   hydrOXYzine 25 MG tablet Commonly known as: ATARAX/VISTARIL Take 1 tablet (25 mg total) by mouth every 8 (eight) hours as needed for anxiety. For anxiety   lamoTRIgine 100 MG tablet Commonly known as: LAMICTAL Take 0.5 tablets (50 mg total) by mouth 2 (two) times daily. For mood stabilization What changed: additional instructions   levothyroxine 50 MCG tablet Commonly known as: SYNTHROID Take 1 tablet (50 mcg total) by mouth daily before breakfast. For  hypothyroidism What changed: additional instructions   milk thistle 175 MG tablet Take 350 mg by mouth daily.   nicotine 21 mg/24hr patch Commonly known as: NICODERM CQ - dosed in mg/24 hours Place 21 mg onto the skin daily.   nicotine 14 mg/24hr patch Commonly known as: NICODERM CQ - dosed in mg/24 hours Place 1 patch (14 mg total) onto the skin daily. (may purchase from over the counter): For smoking  cessation   nicotine polacrilex 4 MG lozenge Commonly known as: COMMIT Take 4 mg by mouth as needed for smoking cessation.   nitroGLYCERIN 0.4 MG SL tablet Commonly known as: NITROSTAT Place 1 tablet (0.4 mg total) under the tongue every 5 (five) minutes as needed for chest pain.   OLANZapine 20 MG tablet Commonly known as: ZYPREXA Take 1 tablet (20 mg total) by mouth at bedtime. For mood control What changed: additional instructions   oxyCODONE 15 MG immediate release tablet Commonly known as: ROXICODONE Take 1 tablet (15 mg total) by mouth 4 (four) times daily. For chronic pain What changed: Another medication with the same name was added. Make sure you understand how and when to take each.   oxyCODONE 5 MG immediate release tablet Commonly known as: Oxy IR/ROXICODONE Take 1 tablet (5 mg total) by mouth every 6 (six) hours as needed for moderate pain (for post op pain in addition to baseline pain meds.). What changed: You were already taking a medication with the same name, and this prescription was added. Make sure you understand how and when to take each.   pantoprazole 40 MG tablet Commonly known as: PROTONIX Take 1 tablet (40 mg total) by mouth daily at 12 noon. For acid reflux   promethazine 25 MG tablet Commonly known as: PHENERGAN Take 25 mg by mouth every 8 (eight) hours as needed for nausea or vomiting.   tiZANidine 4 MG tablet Commonly known as: Zanaflex Take 1 tablet (4 mg total) by mouth every 6 (six) hours as needed.   Vitamin D (Ergocalciferol) 1.25 MG  (50000 UT) Caps capsule Commonly known as: DRISDOL Take 1 capsule (50,000 Units total) by mouth every Thursday. For calcium supplementation What changed: additional instructions            Durable Medical Equipment  (From admission, onward)         Start     Ordered   03/15/19 1528  DME Walker rolling  Once    Question:  Patient needs a walker to treat with the following condition  Answer:  Primary osteoarthritis of right hip   03/15/19 1527   03/15/19 1528  DME 3 n 1  Once     03/15/19 1527   03/15/19 1528  DME Bedside commode  Once    Question:  Patient needs a bedside commode to treat with the following condition  Answer:  Primary osteoarthritis of right hip   03/15/19 1527          Diagnostic Studies: Dg Chest 2 View  Result Date: 03/08/2019 CLINICAL DATA:  Preoperative hip surgery EXAM: CHEST - 2 VIEW COMPARISON:  10/25/2018 FINDINGS: The heart size and mediastinal contours are within normal limits. Both lungs are clear. Partially imaged anterior cervical fusion hardware and thoracic spinal stimulator leads. IMPRESSION: No acute abnormality of the lungs. Electronically Signed   By: Eddie Candle M.D.   On: 03/08/2019 16:16   Dg C-arm 1-60 Min-no Report  Result Date: 03/15/2019 Fluoroscopy was utilized by the requesting physician.  No radiographic interpretation.   Dg Hip Operative Unilat W Or W/o Pelvis Right  Result Date: 03/15/2019 CLINICAL DATA:  Right hip degenerative joint disease. Status post right total hip arthroplasty. EXAM: OPERATIVE RIGHT HIP WITH PELVIS; DG C-ARM 1-60 MIN-NO REPORT OPERATIVE RIGHT HIP (WITH PELVIS IF PERFORMED) 2 VIEWS COMPARISON:  None. FINDINGS: AP C-arm images demonstrate the right hip before and after right total hip arthroplasty. The components of the prosthesis appear in excellent  position in the AP projection with no visible fractures. IMPRESSION: Satisfactory appearance of the right hip after total arthroplasty. FLUOROSCOPY TIME:  32  seconds. C-arm fluoroscopic images were obtained intraoperatively and submitted for post operative interpretation. Electronically Signed   By: Lorriane Shire M.D.   On: 03/15/2019 15:52    Disposition: Discharge disposition: 01-Home or Self Care       Discharge Instructions    Call MD / Call 911   Complete by: As directed    If you experience chest pain or shortness of breath, CALL 911 and be transported to the hospital emergency room.  If you develope a fever above 101 F, pus (white drainage) or increased drainage or redness at the wound, or calf pain, call your surgeon's office.   Constipation Prevention   Complete by: As directed    Drink plenty of fluids.  Prune juice may be helpful.  You may use a stool softener, such as Colace (over the counter) 100 mg twice a day.  Use MiraLax (over the counter) for constipation as needed.   Diet - low sodium heart healthy   Complete by: As directed    Discharge instructions   Complete by: As directed    INSTRUCTIONS AFTER JOINT REPLACEMENT   Remove items at home which could result in a fall. This includes throw rugs or furniture in walking pathways ICE to the affected joint every three hours while awake for 30 minutes at a time, for at least the first 3-5 days, and then as needed for pain and swelling.  Continue to use ice for pain and swelling. You may notice swelling that will progress down to the foot and ankle.  This is normal after surgery.  Elevate your leg when you are not up walking on it.   Continue to use the breathing machine you got in the hospital (incentive spirometer) which will help keep your temperature down.  It is common for your temperature to cycle up and down following surgery, especially at night when you are not up moving around and exerting yourself.  The breathing machine keeps your lungs expanded and your temperature down.   DIET:  As you were doing prior to hospitalization, we recommend a well-balanced diet.  DRESSING  / WOUND CARE / SHOWERING  You may shower 3 days after surgery, but keep the wounds dry during showering.  You may use an occlusive plastic wrap (Press'n Seal for example), NO SOAKING/SUBMERGING IN THE BATHTUB.  If the bandage gets wet, change with a clean dry gauze.  If the incision gets wet, pat the wound dry with a clean towel.  ACTIVITY  Increase activity slowly as tolerated, but follow the weight bearing instructions below.   No driving for 6 weeks or until further direction given by your physician.  You cannot drive while taking narcotics.  No lifting or carrying greater than 10 lbs. until further directed by your surgeon. Avoid periods of inactivity such as sitting longer than an hour when not asleep. This helps prevent blood clots.  You may return to work once you are authorized by your doctor.     WEIGHT BEARING   Weight bearing as tolerated with assist device (walker, cane, etc) as directed, use it as long as suggested by your surgeon or therapist, typically at least 4-6 weeks.   EXERCISES  Results after joint replacement surgery are often greatly improved when you follow the exercise, range of motion and muscle strengthening exercises prescribed by your doctor. Safety  measures are also important to protect the joint from further injury. Any time any of these exercises cause you to have increased pain or swelling, decrease what you are doing until you are comfortable again and then slowly increase them. If you have problems or questions, call your caregiver or physical therapist for advice.   Rehabilitation is important following a joint replacement. After just a few days of immobilization, the muscles of the leg can become weakened and shrink (atrophy).  These exercises are designed to build up the tone and strength of the thigh and leg muscles and to improve motion. Often times heat used for twenty to thirty minutes before working out will loosen up your tissues and help with  improving the range of motion but do not use heat for the first two weeks following surgery (sometimes heat can increase post-operative swelling).   These exercises can be done on a training (exercise) mat, on the floor, on a table or on a bed. Use whatever works the best and is most comfortable for you.    Use music or television while you are exercising so that the exercises are a pleasant break in your day. This will make your life better with the exercises acting as a break in your routine that you can look forward to.   Perform all exercises about fifteen times, three times per day or as directed.  You should exercise both the operative leg and the other leg as well.   Exercises include:   Quad Sets - Tighten up the muscle on the front of the thigh (Quad) and hold for 5-10 seconds.   Straight Leg Raises - With your knee straight (if you were given a brace, keep it on), lift the leg to 60 degrees, hold for 3 seconds, and slowly lower the leg.  Perform this exercise against resistance later as your leg gets stronger.  Leg Slides: Lying on your back, slowly slide your foot toward your buttocks, bending your knee up off the floor (only go as far as is comfortable). Then slowly slide your foot back down until your leg is flat on the floor again.  Angel Wings: Lying on your back spread your legs to the side as far apart as you can without causing discomfort.  Hamstring Strength:  Lying on your back, push your heel against the floor with your leg straight by tightening up the muscles of your buttocks.  Repeat, but this time bend your knee to a comfortable angle, and push your heel against the floor.  You may put a pillow under the heel to make it more comfortable if necessary.   A rehabilitation program following joint replacement surgery can speed recovery and prevent re-injury in the future due to weakened muscles. Contact your doctor or a physical therapist for more information on knee rehabilitation.     CONSTIPATION  Constipation is defined medically as fewer than three stools per week and severe constipation as less than one stool per week.  Even if you have a regular bowel pattern at home, your normal regimen is likely to be disrupted due to multiple reasons following surgery.  Combination of anesthesia, postoperative narcotics, change in appetite and fluid intake all can affect your bowels.   YOU MUST use at least one of the following options; they are listed in order of increasing strength to get the job done.  They are all available over the counter, and you may need to use some, POSSIBLY even all of these  options:    Drink plenty of fluids (prune juice may be helpful) and high fiber foods Colace 100 mg by mouth twice a day  Senokot for constipation as directed and as needed Dulcolax (bisacodyl), take with full glass of water  Miralax (polyethylene glycol) once or twice a day as needed.  If you have tried all these things and are unable to have a bowel movement in the first 3-4 days after surgery call either your surgeon or your primary doctor.    If you experience loose stools or diarrhea, hold the medications until you stool forms back up.  If your symptoms do not get better within 1 week or if they get worse, check with your doctor.  If you experience "the worst abdominal pain ever" or develop nausea or vomiting, please contact the office immediately for further recommendations for treatment.   ITCHING:  If you experience itching with your medications, try taking only a single pain pill, or even half a pain pill at a time.  You can also use Benadryl over the counter for itching or also to help with sleep.   TED HOSE STOCKINGS:  Use stockings on both legs until for at least 2 weeks or as directed by physician office. They may be removed at night for sleeping.  MEDICATIONS:  See your medication summary on the "After Visit Summary" that nursing will review with you.  You may have some  home medications which will be placed on hold until you complete the course of blood thinner medication.  It is important for you to complete the blood thinner medication as prescribed.  PRECAUTIONS:  If you experience chest pain or shortness of breath - call 911 immediately for transfer to the hospital emergency department.   If you develop a fever greater that 101 F, purulent drainage from wound, increased redness or drainage from wound, foul odor from the wound/dressing, or calf pain - CONTACT YOUR SURGEON.                                                   FOLLOW-UP APPOINTMENTS:  If you do not already have a post-op appointment, please call the office for an appointment to be seen by your surgeon.  Guidelines for how soon to be seen are listed in your "After Visit Summary", but are typically between 1-4 weeks after surgery.  OTHER INSTRUCTIONS:   Knee Replacement:  Do not place pillow under knee, focus on keeping the knee straight while resting. CPM instructions: 0-90 degrees, 2 hours in the morning, 2 hours in the afternoon, and 2 hours in the evening. Place foam block, curve side up under heel at all times except when in CPM or when walking.  DO NOT modify, tear, cut, or change the foam block in any way.  MAKE SURE YOU:  Understand these instructions.  Get help right away if you are not doing well or get worse.    Thank you for letting us be a part of your medical care team.  It is a privilege we respect greatly.  We hope these instructions will help you stay on track for a fast and full recovery!   Increase activity slowly as tolerated   Complete by: As directed       Follow-up Information    Melrose Nakayama, MD. Schedule an appointment as soon  as possible for a visit in 2 weeks.   Specialty: Orthopedic Surgery Contact information: [place] [address] [phone]            Signed: Larwance Sachs Dontrail Blackwell 03/16/2019, 8:01 AM

## 2019-03-16 NOTE — Progress Notes (Signed)
Physical Therapy Treatment Patient Details Name: [name] MRN: [id number] DOB: [birth year] Today's Date: 03/16/2019    History of Present Illness R DA-THA; PMH of head injury, bipolar d/o, anxiety, fibromyalgia, spinal cord stimulator, cervial fusions, tremors at baseline, cocaine and marijuana abuse    PT Comments    Pt is progressing well with mobility and is ready to DC home from PT standpoint. He ambulated 220' with RW and demonstrates good understanding of HEP.   Follow Up Recommendations  Follow surgeon's recommendation for DC plan and follow-up therapies     Equipment Recommendations  None recommended by PT    Recommendations for Other Services       Precautions / Restrictions Precautions Precautions: Fall Precaution Comments: pt reports h/o 1 fall in past 1 year (RLE "gave out") Restrictions Weight Bearing Restrictions: No    Mobility  Bed Mobility Overal bed mobility: Needs Assistance Bed Mobility: Supine to Sit     Supine to sit: Modified independent (Device/Increase time);HOB elevated        Transfers Overall transfer level: Needs assistance Equipment used: Rolling walker (2 wheeled) Transfers: Sit to/from Stand Sit to Stand: Supervision         General transfer comment: VCs hand placment  Ambulation/Gait Ambulation/Gait assistance: Supervision Gait Distance (Feet): 220 Feet Assistive device: Rolling walker (2 wheeled) Gait Pattern/deviations: Decreased stride length;Decreased weight shift to right;Step-through pattern Gait velocity: decr   General Gait Details: no loss of balance   Stairs             Wheelchair Mobility    Modified Rankin (Stroke Patients Only)       Balance Overall balance assessment: Modified Independent;History of Falls                                          Cognition Arousal/Alertness: Awake/alert Behavior During Therapy: WFL for tasks assessed/performed Overall Cognitive  Status: Within Functional Limits for tasks assessed                                        Exercises Total Joint Exercises Ankle Circles/Pumps: AROM;Both;10 reps;Supine Quad Sets: AROM;Right;5 reps;Supine Short Arc Quad: AROM;Right;10 reps;Supine Heel Slides: AAROM;Right;10 reps;Supine Hip ABduction/ADduction: AAROM;Right;10 reps;Supine Long Arc Quad: AROM;Right;10 reps;Seated    General Comments        Pertinent Vitals/Pain Pain Score: 5  Pain Location: R hip Pain Descriptors / Indicators: Sore Pain Intervention(s): Limited activity within patient's tolerance;Monitored during session;Premedicated before session;Ice applied    Home Living                      Prior Function            PT Goals (current goals can now be found in the care plan section) Acute Rehab PT Goals Patient Stated Goal: be able to go for long walks PT Goal Formulation: With patient Time For Goal Achievement: 03/22/19 Potential to Achieve Goals: Good Progress towards PT goals: Progressing toward goals    Frequency    7X/week      PT Plan Current plan remains appropriate    Co-evaluation              AM-PAC PT "6 Clicks" Mobility   Outcome Measure  Help needed turning from your back to  your side while in a flat bed without using bedrails?: None Help needed moving from lying on your back to sitting on the side of a flat bed without using bedrails?: None Help needed moving to and from a bed to a chair (including a wheelchair)?: None Help needed standing up from a chair using your arms (e.g., wheelchair or bedside chair)?: None Help needed to walk in hospital room?: None Help needed climbing 3-5 steps with a railing? : A Little 6 Click Score: 23    End of Session Equipment Utilized During Treatment: Gait belt Activity Tolerance: Patient tolerated treatment well Patient left: in chair;with call bell/phone within reach;with chair alarm set Nurse  Communication: Mobility status PT Visit Diagnosis: Muscle weakness (generalized) (M62.81);Difficulty in walking, not elsewhere classified (R26.2);Pain Pain - Right/Left: Right Pain - part of body: Hip     Time: 1100-1118 PT Time Calculation (min) (ACUTE ONLY): 18 min  Charges:  $Gait Training: 8-22 mins                     Blondell Reveal Kistler PT 03/16/2019  Acute Rehabilitation Services Pager [phone] Office [phone]

## 2019-03-16 NOTE — Plan of Care (Signed)
  Problem: Education: Goal: Knowledge of General Education information will improve Description: Including pain rating scale, medication(s)/side effects and non-pharmacologic comfort measures Outcome: Adequate for Discharge   Problem: Health Behavior/Discharge Planning: Goal: Ability to manage health-related needs will improve Outcome: Adequate for Discharge   Problem: Clinical Measurements: Goal: Ability to maintain clinical measurements within normal limits will improve Outcome: Adequate for Discharge Goal: Will remain free from infection Outcome: Adequate for Discharge Goal: Diagnostic test results will improve Outcome: Adequate for Discharge Goal: Respiratory complications will improve Outcome: Adequate for Discharge   Problem: Elimination: Goal: Will not experience complications related to bowel motility Outcome: Adequate for Discharge   Problem: Education: Goal: Knowledge of the prescribed therapeutic regimen will improve Outcome: Adequate for Discharge Goal: Understanding of discharge needs will improve Outcome: Adequate for Discharge   Problem: Activity: Goal: Ability to avoid complications of mobility impairment will improve Outcome: Adequate for Discharge Goal: Ability to tolerate increased activity will improve Outcome: Adequate for Discharge   Problem: Clinical Measurements: Goal: Postoperative complications will be avoided or minimized Outcome: Adequate for Discharge   Problem: Skin Integrity: Goal: Will show signs of wound healing Outcome: Adequate for Discharge

## 2019-03-16 NOTE — Progress Notes (Signed)
Subjective: 1 Day Post-Op Procedure(s) (LRB): RIGHT TOTAL HIP ARTHROPLASTY ANTERIOR APPROACH (Right)   Patient wants to go home this morning he feels better than he thought he would.   Activity level:  wbat Diet tolerance:  ok Voiding:  ok Patient reports pain as mild.    Objective: Vital signs in last 24 hours: Temp:  [97.7 F (36.5 C)-98.6 F (37 C)] 97.8 F (36.6 C) (10/14 0552) Pulse Rate:  [53-72] 62 (10/14 0554) Resp:  [11-18] 14 (10/14 0552) BP: (88-133)/(53-116) 88/57 (10/14 0554) SpO2:  [95 %-100 %] 95 % (10/14 0554) Weight:  [103.5 kg] 103.5 kg (10/13 1046)  Labs: No results for input(s): HGB in the last 72 hours. No results for input(s): WBC, RBC, HCT, PLT in the last 72 hours. No results for input(s): NA, K, CL, CO2, BUN, CREATININE, GLUCOSE, CALCIUM in the last 72 hours. No results for input(s): LABPT, INR in the last 72 hours.  Physical Exam:  Neurologically intact ABD soft Neurovascular intact Sensation intact distally Intact pulses distally Dorsiflexion/Plantar flexion intact Incision: dressing C/D/I and no drainage No cellulitis present Compartment soft  Assessment/Plan:  1 Day Post-Op Procedure(s) (LRB): RIGHT TOTAL HIP ARTHROPLASTY ANTERIOR APPROACH (Right) Advance diet Up with therapy D/C IV fluids Discharge home with home health today Continue on 81mg  ASA BID x 4 week post op. Follow up in office 2 weeks post op. We will ad 5mg  oxycodone to his baseline meds for 2 weeks then he will be back on baseline meds.    Larwance Sachs Cinque Begley 03/16/2019, 7:56 AM

## 2019-03-17 DIAGNOSIS — M4802 Spinal stenosis, cervical region: Secondary | ICD-10-CM | POA: Diagnosis not present

## 2019-03-17 NOTE — Anesthesia Postprocedure Evaluation (Signed)
Anesthesia Post Note  Patient: [name]  Procedure(s) Performed: RIGHT TOTAL HIP ARTHROPLASTY ANTERIOR APPROACH (Right Hip)     Patient location during evaluation: PACU Anesthesia Type: General Level of consciousness: sedated Pain management: pain level controlled Vital Signs Assessment: post-procedure vital signs reviewed and stable Respiratory status: spontaneous breathing Cardiovascular status: stable Postop Assessment: no apparent nausea or vomiting Anesthetic complications: no    Last Vitals:  Vitals:   03/16/19 0554 03/16/19 0932  BP: (!) 88/57 (!) 91/58  Pulse: 62 71  Resp:  16  Temp:  36.6 C  SpO2: 95% 96%    Last Pain:  Vitals:   03/16/19 1200  TempSrc:   PainSc: 3    Pain Goal: Patients Stated Pain Goal: 3 (03/16/19 1002)                 Huston Foley

## 2019-03-18 DIAGNOSIS — I2510 Atherosclerotic heart disease of native coronary artery without angina pectoris: Secondary | ICD-10-CM | POA: Diagnosis not present

## 2019-03-18 DIAGNOSIS — N189 Chronic kidney disease, unspecified: Secondary | ICD-10-CM | POA: Diagnosis not present

## 2019-03-18 DIAGNOSIS — I129 Hypertensive chronic kidney disease with stage 1 through stage 4 chronic kidney disease, or unspecified chronic kidney disease: Secondary | ICD-10-CM | POA: Diagnosis not present

## 2019-03-18 DIAGNOSIS — F419 Anxiety disorder, unspecified: Secondary | ICD-10-CM | POA: Diagnosis not present

## 2019-03-18 DIAGNOSIS — F315 Bipolar disorder, current episode depressed, severe, with psychotic features: Secondary | ICD-10-CM | POA: Diagnosis not present

## 2019-03-18 DIAGNOSIS — M4802 Spinal stenosis, cervical region: Secondary | ICD-10-CM | POA: Diagnosis not present

## 2019-03-18 DIAGNOSIS — Z471 Aftercare following joint replacement surgery: Secondary | ICD-10-CM | POA: Diagnosis not present

## 2019-03-18 DIAGNOSIS — G8929 Other chronic pain: Secondary | ICD-10-CM | POA: Diagnosis not present

## 2019-03-18 DIAGNOSIS — M5136 Other intervertebral disc degeneration, lumbar region: Secondary | ICD-10-CM | POA: Diagnosis not present

## 2019-03-21 DIAGNOSIS — M4802 Spinal stenosis, cervical region: Secondary | ICD-10-CM | POA: Diagnosis not present

## 2019-03-22 DIAGNOSIS — M4802 Spinal stenosis, cervical region: Secondary | ICD-10-CM | POA: Diagnosis not present

## 2019-03-22 DIAGNOSIS — Z471 Aftercare following joint replacement surgery: Secondary | ICD-10-CM | POA: Diagnosis not present

## 2019-03-22 DIAGNOSIS — N189 Chronic kidney disease, unspecified: Secondary | ICD-10-CM | POA: Diagnosis not present

## 2019-03-22 DIAGNOSIS — M5136 Other intervertebral disc degeneration, lumbar region: Secondary | ICD-10-CM | POA: Diagnosis not present

## 2019-03-22 DIAGNOSIS — F315 Bipolar disorder, current episode depressed, severe, with psychotic features: Secondary | ICD-10-CM | POA: Diagnosis not present

## 2019-03-22 DIAGNOSIS — G8929 Other chronic pain: Secondary | ICD-10-CM | POA: Diagnosis not present

## 2019-03-22 DIAGNOSIS — I129 Hypertensive chronic kidney disease with stage 1 through stage 4 chronic kidney disease, or unspecified chronic kidney disease: Secondary | ICD-10-CM | POA: Diagnosis not present

## 2019-03-22 DIAGNOSIS — F419 Anxiety disorder, unspecified: Secondary | ICD-10-CM | POA: Diagnosis not present

## 2019-03-22 DIAGNOSIS — I2510 Atherosclerotic heart disease of native coronary artery without angina pectoris: Secondary | ICD-10-CM | POA: Diagnosis not present

## 2019-03-23 DIAGNOSIS — I2510 Atherosclerotic heart disease of native coronary artery without angina pectoris: Secondary | ICD-10-CM | POA: Diagnosis not present

## 2019-03-23 DIAGNOSIS — I129 Hypertensive chronic kidney disease with stage 1 through stage 4 chronic kidney disease, or unspecified chronic kidney disease: Secondary | ICD-10-CM | POA: Diagnosis not present

## 2019-03-23 DIAGNOSIS — M5136 Other intervertebral disc degeneration, lumbar region: Secondary | ICD-10-CM | POA: Diagnosis not present

## 2019-03-23 DIAGNOSIS — F419 Anxiety disorder, unspecified: Secondary | ICD-10-CM | POA: Diagnosis not present

## 2019-03-23 DIAGNOSIS — N189 Chronic kidney disease, unspecified: Secondary | ICD-10-CM | POA: Diagnosis not present

## 2019-03-23 DIAGNOSIS — M4802 Spinal stenosis, cervical region: Secondary | ICD-10-CM | POA: Diagnosis not present

## 2019-03-23 DIAGNOSIS — Z471 Aftercare following joint replacement surgery: Secondary | ICD-10-CM | POA: Diagnosis not present

## 2019-03-23 DIAGNOSIS — G8929 Other chronic pain: Secondary | ICD-10-CM | POA: Diagnosis not present

## 2019-03-23 DIAGNOSIS — F315 Bipolar disorder, current episode depressed, severe, with psychotic features: Secondary | ICD-10-CM | POA: Diagnosis not present

## 2019-03-25 DIAGNOSIS — I2510 Atherosclerotic heart disease of native coronary artery without angina pectoris: Secondary | ICD-10-CM | POA: Diagnosis not present

## 2019-03-25 DIAGNOSIS — M4802 Spinal stenosis, cervical region: Secondary | ICD-10-CM | POA: Diagnosis not present

## 2019-03-25 DIAGNOSIS — G8929 Other chronic pain: Secondary | ICD-10-CM | POA: Diagnosis not present

## 2019-03-25 DIAGNOSIS — N189 Chronic kidney disease, unspecified: Secondary | ICD-10-CM | POA: Diagnosis not present

## 2019-03-25 DIAGNOSIS — F419 Anxiety disorder, unspecified: Secondary | ICD-10-CM | POA: Diagnosis not present

## 2019-03-25 DIAGNOSIS — I129 Hypertensive chronic kidney disease with stage 1 through stage 4 chronic kidney disease, or unspecified chronic kidney disease: Secondary | ICD-10-CM | POA: Diagnosis not present

## 2019-03-25 DIAGNOSIS — F315 Bipolar disorder, current episode depressed, severe, with psychotic features: Secondary | ICD-10-CM | POA: Diagnosis not present

## 2019-03-25 DIAGNOSIS — M5136 Other intervertebral disc degeneration, lumbar region: Secondary | ICD-10-CM | POA: Diagnosis not present

## 2019-03-25 DIAGNOSIS — Z471 Aftercare following joint replacement surgery: Secondary | ICD-10-CM | POA: Diagnosis not present

## 2019-03-28 DIAGNOSIS — F315 Bipolar disorder, current episode depressed, severe, with psychotic features: Secondary | ICD-10-CM | POA: Diagnosis not present

## 2019-03-28 DIAGNOSIS — G8929 Other chronic pain: Secondary | ICD-10-CM | POA: Diagnosis not present

## 2019-03-28 DIAGNOSIS — F419 Anxiety disorder, unspecified: Secondary | ICD-10-CM | POA: Diagnosis not present

## 2019-03-28 DIAGNOSIS — Z471 Aftercare following joint replacement surgery: Secondary | ICD-10-CM | POA: Diagnosis not present

## 2019-03-28 DIAGNOSIS — N189 Chronic kidney disease, unspecified: Secondary | ICD-10-CM | POA: Diagnosis not present

## 2019-03-28 DIAGNOSIS — M5136 Other intervertebral disc degeneration, lumbar region: Secondary | ICD-10-CM | POA: Diagnosis not present

## 2019-03-28 DIAGNOSIS — I129 Hypertensive chronic kidney disease with stage 1 through stage 4 chronic kidney disease, or unspecified chronic kidney disease: Secondary | ICD-10-CM | POA: Diagnosis not present

## 2019-03-28 DIAGNOSIS — Z7189 Other specified counseling: Secondary | ICD-10-CM | POA: Diagnosis not present

## 2019-03-28 DIAGNOSIS — I2510 Atherosclerotic heart disease of native coronary artery without angina pectoris: Secondary | ICD-10-CM | POA: Diagnosis not present

## 2019-03-28 DIAGNOSIS — Z1159 Encounter for screening for other viral diseases: Secondary | ICD-10-CM | POA: Diagnosis not present

## 2019-03-28 DIAGNOSIS — Z23 Encounter for immunization: Secondary | ICD-10-CM | POA: Diagnosis not present

## 2019-03-28 DIAGNOSIS — M1611 Unilateral primary osteoarthritis, right hip: Secondary | ICD-10-CM | POA: Diagnosis not present

## 2019-03-28 DIAGNOSIS — G4700 Insomnia, unspecified: Secondary | ICD-10-CM | POA: Diagnosis not present

## 2019-03-28 DIAGNOSIS — I10 Essential (primary) hypertension: Secondary | ICD-10-CM | POA: Diagnosis not present

## 2019-03-28 DIAGNOSIS — M4802 Spinal stenosis, cervical region: Secondary | ICD-10-CM | POA: Diagnosis not present

## 2019-03-30 DIAGNOSIS — M4802 Spinal stenosis, cervical region: Secondary | ICD-10-CM | POA: Diagnosis not present

## 2019-03-30 DIAGNOSIS — M25651 Stiffness of right hip, not elsewhere classified: Secondary | ICD-10-CM | POA: Diagnosis not present

## 2019-03-30 DIAGNOSIS — M25551 Pain in right hip: Secondary | ICD-10-CM | POA: Diagnosis not present

## 2019-04-01 DIAGNOSIS — M4802 Spinal stenosis, cervical region: Secondary | ICD-10-CM | POA: Diagnosis not present

## 2019-04-04 DIAGNOSIS — M4802 Spinal stenosis, cervical region: Secondary | ICD-10-CM | POA: Diagnosis not present

## 2019-04-05 DIAGNOSIS — R221 Localized swelling, mass and lump, neck: Secondary | ICD-10-CM | POA: Diagnosis not present

## 2019-04-05 DIAGNOSIS — K1370 Unspecified lesions of oral mucosa: Secondary | ICD-10-CM | POA: Diagnosis not present

## 2019-04-05 DIAGNOSIS — F17200 Nicotine dependence, unspecified, uncomplicated: Secondary | ICD-10-CM | POA: Diagnosis not present

## 2019-04-06 DIAGNOSIS — M4802 Spinal stenosis, cervical region: Secondary | ICD-10-CM | POA: Diagnosis not present

## 2019-04-08 DIAGNOSIS — M4802 Spinal stenosis, cervical region: Secondary | ICD-10-CM | POA: Diagnosis not present

## 2019-04-11 DIAGNOSIS — M4802 Spinal stenosis, cervical region: Secondary | ICD-10-CM | POA: Diagnosis not present

## 2019-04-13 DIAGNOSIS — F411 Generalized anxiety disorder: Secondary | ICD-10-CM | POA: Diagnosis not present

## 2019-04-13 DIAGNOSIS — F3132 Bipolar disorder, current episode depressed, moderate: Secondary | ICD-10-CM | POA: Diagnosis not present

## 2019-04-13 DIAGNOSIS — M25651 Stiffness of right hip, not elsewhere classified: Secondary | ICD-10-CM | POA: Diagnosis not present

## 2019-04-13 DIAGNOSIS — M25551 Pain in right hip: Secondary | ICD-10-CM | POA: Diagnosis not present

## 2019-04-13 DIAGNOSIS — M4802 Spinal stenosis, cervical region: Secondary | ICD-10-CM | POA: Diagnosis not present

## 2019-04-15 DIAGNOSIS — M4802 Spinal stenosis, cervical region: Secondary | ICD-10-CM | POA: Diagnosis not present

## 2019-04-16 DIAGNOSIS — M542 Cervicalgia: Secondary | ICD-10-CM | POA: Diagnosis not present

## 2019-04-16 DIAGNOSIS — M549 Dorsalgia, unspecified: Secondary | ICD-10-CM | POA: Diagnosis not present

## 2019-04-16 DIAGNOSIS — M545 Low back pain: Secondary | ICD-10-CM | POA: Diagnosis not present

## 2019-04-17 DIAGNOSIS — M545 Low back pain: Secondary | ICD-10-CM | POA: Diagnosis not present

## 2019-04-17 DIAGNOSIS — M542 Cervicalgia: Secondary | ICD-10-CM | POA: Diagnosis not present

## 2019-04-17 DIAGNOSIS — M549 Dorsalgia, unspecified: Secondary | ICD-10-CM | POA: Diagnosis not present

## 2019-04-18 DIAGNOSIS — M4802 Spinal stenosis, cervical region: Secondary | ICD-10-CM | POA: Diagnosis not present

## 2019-04-19 DIAGNOSIS — R220 Localized swelling, mass and lump, head: Secondary | ICD-10-CM | POA: Diagnosis not present

## 2019-04-19 DIAGNOSIS — K1370 Unspecified lesions of oral mucosa: Secondary | ICD-10-CM | POA: Diagnosis not present

## 2019-04-20 DIAGNOSIS — M4802 Spinal stenosis, cervical region: Secondary | ICD-10-CM | POA: Diagnosis not present

## 2019-04-22 DIAGNOSIS — M25651 Stiffness of right hip, not elsewhere classified: Secondary | ICD-10-CM | POA: Diagnosis not present

## 2019-04-22 DIAGNOSIS — M25551 Pain in right hip: Secondary | ICD-10-CM | POA: Diagnosis not present

## 2019-04-22 DIAGNOSIS — M4802 Spinal stenosis, cervical region: Secondary | ICD-10-CM | POA: Diagnosis not present

## 2019-04-25 DIAGNOSIS — M4802 Spinal stenosis, cervical region: Secondary | ICD-10-CM | POA: Diagnosis not present

## 2019-04-26 DIAGNOSIS — F411 Generalized anxiety disorder: Secondary | ICD-10-CM | POA: Diagnosis not present

## 2019-04-26 DIAGNOSIS — G894 Chronic pain syndrome: Secondary | ICD-10-CM | POA: Diagnosis not present

## 2019-04-26 DIAGNOSIS — Z79899 Other long term (current) drug therapy: Secondary | ICD-10-CM | POA: Diagnosis not present

## 2019-04-27 DIAGNOSIS — M4802 Spinal stenosis, cervical region: Secondary | ICD-10-CM | POA: Diagnosis not present

## 2019-04-29 DIAGNOSIS — M4802 Spinal stenosis, cervical region: Secondary | ICD-10-CM | POA: Diagnosis not present

## 2019-05-02 DIAGNOSIS — M4802 Spinal stenosis, cervical region: Secondary | ICD-10-CM | POA: Diagnosis not present

## 2019-05-04 DIAGNOSIS — M4802 Spinal stenosis, cervical region: Secondary | ICD-10-CM | POA: Diagnosis not present

## 2019-05-06 DIAGNOSIS — M4802 Spinal stenosis, cervical region: Secondary | ICD-10-CM | POA: Diagnosis not present

## 2019-05-09 DIAGNOSIS — M4802 Spinal stenosis, cervical region: Secondary | ICD-10-CM | POA: Diagnosis not present

## 2019-05-11 DIAGNOSIS — M4802 Spinal stenosis, cervical region: Secondary | ICD-10-CM | POA: Diagnosis not present

## 2019-05-12 MED FILL — AMANTADINE HCL 100 MG TAB: 100 | 30 days supply | Qty: 30 | Fill #0

## 2019-05-13 DIAGNOSIS — M4802 Spinal stenosis, cervical region: Secondary | ICD-10-CM | POA: Diagnosis not present

## 2019-05-16 DIAGNOSIS — M4802 Spinal stenosis, cervical region: Secondary | ICD-10-CM | POA: Diagnosis not present

## 2019-05-18 DIAGNOSIS — M4802 Spinal stenosis, cervical region: Secondary | ICD-10-CM | POA: Diagnosis not present

## 2019-05-18 DIAGNOSIS — Z760 Encounter for issue of repeat prescription: Secondary | ICD-10-CM | POA: Diagnosis not present

## 2019-05-20 DIAGNOSIS — R221 Localized swelling, mass and lump, neck: Secondary | ICD-10-CM | POA: Diagnosis not present

## 2019-05-20 DIAGNOSIS — M4802 Spinal stenosis, cervical region: Secondary | ICD-10-CM | POA: Diagnosis not present

## 2019-05-20 DIAGNOSIS — Z87891 Personal history of nicotine dependence: Secondary | ICD-10-CM | POA: Diagnosis not present

## 2019-05-23 DIAGNOSIS — M4802 Spinal stenosis, cervical region: Secondary | ICD-10-CM | POA: Diagnosis not present

## 2019-05-24 DIAGNOSIS — R251 Tremor, unspecified: Secondary | ICD-10-CM | POA: Diagnosis not present

## 2019-05-24 DIAGNOSIS — Z125 Encounter for screening for malignant neoplasm of prostate: Secondary | ICD-10-CM | POA: Diagnosis not present

## 2019-05-24 DIAGNOSIS — Z0000 Encounter for general adult medical examination without abnormal findings: Secondary | ICD-10-CM | POA: Diagnosis not present

## 2019-05-24 DIAGNOSIS — F411 Generalized anxiety disorder: Secondary | ICD-10-CM | POA: Diagnosis not present

## 2019-05-24 DIAGNOSIS — M5412 Radiculopathy, cervical region: Secondary | ICD-10-CM | POA: Diagnosis not present

## 2019-05-24 DIAGNOSIS — Z79899 Other long term (current) drug therapy: Secondary | ICD-10-CM | POA: Diagnosis not present

## 2019-05-24 MED FILL — NICOTINE 21 MG/24HR PATCH: 21 | 14 days supply | Qty: 14 | Fill #0

## 2019-05-24 MED FILL — AMANTADINE HCL 100 MG TAB: 100 | 30 days supply | Qty: 30 | Fill #0

## 2019-05-24 MED FILL — NICOTINE 2 MG LOZENGE: 2 | 30 days supply | Qty: 72 | Fill #0

## 2019-05-25 DIAGNOSIS — M4802 Spinal stenosis, cervical region: Secondary | ICD-10-CM | POA: Diagnosis not present

## 2019-05-30 DIAGNOSIS — M4802 Spinal stenosis, cervical region: Secondary | ICD-10-CM | POA: Diagnosis not present

## 2019-06-01 DIAGNOSIS — M4802 Spinal stenosis, cervical region: Secondary | ICD-10-CM | POA: Diagnosis not present

## 2019-06-02 DIAGNOSIS — Z79899 Other long term (current) drug therapy: Secondary | ICD-10-CM | POA: Diagnosis not present

## 2019-06-03 DIAGNOSIS — M4802 Spinal stenosis, cervical region: Secondary | ICD-10-CM | POA: Diagnosis not present

## 2019-06-06 DIAGNOSIS — M4802 Spinal stenosis, cervical region: Secondary | ICD-10-CM | POA: Diagnosis not present

## 2019-06-08 DIAGNOSIS — M4802 Spinal stenosis, cervical region: Secondary | ICD-10-CM | POA: Diagnosis not present

## 2019-06-10 DIAGNOSIS — M4802 Spinal stenosis, cervical region: Secondary | ICD-10-CM | POA: Diagnosis not present

## 2019-06-13 DIAGNOSIS — M4802 Spinal stenosis, cervical region: Secondary | ICD-10-CM | POA: Diagnosis not present

## 2019-06-15 DIAGNOSIS — M4802 Spinal stenosis, cervical region: Secondary | ICD-10-CM | POA: Diagnosis not present

## 2019-06-17 DIAGNOSIS — M4802 Spinal stenosis, cervical region: Secondary | ICD-10-CM | POA: Diagnosis not present

## 2019-06-19 DIAGNOSIS — Z20828 Contact with and (suspected) exposure to other viral communicable diseases: Secondary | ICD-10-CM | POA: Diagnosis not present

## 2019-06-20 DIAGNOSIS — M4802 Spinal stenosis, cervical region: Secondary | ICD-10-CM | POA: Diagnosis not present

## 2019-06-21 MED FILL — AMANTADINE 100 MG TABLET: 100 | 30 days supply | Qty: 30 | Fill #0

## 2019-06-21 MED FILL — NICOTINE 21 MG/24HR PATCH: 21 | 14 days supply | Qty: 14 | Fill #0

## 2019-06-21 MED FILL — SM NICOTINE 2 MG LOZENGE: 2 | 30 days supply | Qty: 72 | Fill #0

## 2019-06-23 DIAGNOSIS — Z9189 Other specified personal risk factors, not elsewhere classified: Secondary | ICD-10-CM | POA: Diagnosis not present

## 2019-06-23 DIAGNOSIS — M5412 Radiculopathy, cervical region: Secondary | ICD-10-CM | POA: Diagnosis not present

## 2019-06-23 DIAGNOSIS — U071 COVID-19: Secondary | ICD-10-CM | POA: Diagnosis not present

## 2019-06-23 DIAGNOSIS — F411 Generalized anxiety disorder: Secondary | ICD-10-CM | POA: Diagnosis not present

## 2019-06-29 DIAGNOSIS — F411 Generalized anxiety disorder: Secondary | ICD-10-CM | POA: Diagnosis not present

## 2019-06-29 DIAGNOSIS — F3132 Bipolar disorder, current episode depressed, moderate: Secondary | ICD-10-CM | POA: Diagnosis not present

## 2019-07-05 MED FILL — SM NICOTINE 2 MG LOZENGE: 2 | 30 days supply | Qty: 72 | Fill #0

## 2019-07-05 MED FILL — NICOTINE 21 MG/24HR PATCH: 21 | 28 days supply | Qty: 28 | Fill #0

## 2019-07-07 DIAGNOSIS — M4802 Spinal stenosis, cervical region: Secondary | ICD-10-CM | POA: Diagnosis not present

## 2019-07-18 DIAGNOSIS — M4802 Spinal stenosis, cervical region: Secondary | ICD-10-CM | POA: Diagnosis not present

## 2019-07-20 DIAGNOSIS — M4802 Spinal stenosis, cervical region: Secondary | ICD-10-CM | POA: Diagnosis not present

## 2019-07-22 DIAGNOSIS — M4802 Spinal stenosis, cervical region: Secondary | ICD-10-CM | POA: Diagnosis not present

## 2019-07-26 DIAGNOSIS — M5412 Radiculopathy, cervical region: Secondary | ICD-10-CM | POA: Diagnosis not present

## 2019-07-26 DIAGNOSIS — Z79899 Other long term (current) drug therapy: Secondary | ICD-10-CM | POA: Diagnosis not present

## 2019-07-29 MED FILL — SM NICOTINE 2 MG LOZENGE: 2 | 30 days supply | Qty: 72 | Fill #1

## 2019-07-29 MED FILL — NICOTINE 21 MG/24HR PATCH: 21 | 28 days supply | Qty: 28 | Fill #1

## 2019-08-23 DIAGNOSIS — Z79899 Other long term (current) drug therapy: Secondary | ICD-10-CM | POA: Diagnosis not present

## 2019-08-23 DIAGNOSIS — G4700 Insomnia, unspecified: Secondary | ICD-10-CM | POA: Diagnosis not present

## 2019-08-23 DIAGNOSIS — M5412 Radiculopathy, cervical region: Secondary | ICD-10-CM | POA: Diagnosis not present

## 2019-08-23 DIAGNOSIS — F411 Generalized anxiety disorder: Secondary | ICD-10-CM | POA: Diagnosis not present

## 2019-08-25 DIAGNOSIS — I10 Essential (primary) hypertension: Secondary | ICD-10-CM | POA: Diagnosis not present

## 2019-08-25 DIAGNOSIS — G4700 Insomnia, unspecified: Secondary | ICD-10-CM | POA: Diagnosis not present

## 2019-08-25 DIAGNOSIS — T50905A Adverse effect of unspecified drugs, medicaments and biological substances, initial encounter: Secondary | ICD-10-CM | POA: Diagnosis not present

## 2019-08-25 DIAGNOSIS — R339 Retention of urine, unspecified: Secondary | ICD-10-CM | POA: Diagnosis not present

## 2019-08-25 DIAGNOSIS — G249 Dystonia, unspecified: Secondary | ICD-10-CM | POA: Diagnosis not present

## 2019-09-22 DIAGNOSIS — F411 Generalized anxiety disorder: Secondary | ICD-10-CM | POA: Diagnosis not present

## 2019-09-22 DIAGNOSIS — G4700 Insomnia, unspecified: Secondary | ICD-10-CM | POA: Diagnosis not present

## 2019-09-22 DIAGNOSIS — Z79899 Other long term (current) drug therapy: Secondary | ICD-10-CM | POA: Diagnosis not present

## 2019-09-22 DIAGNOSIS — R000 Tachycardia, unspecified: Secondary | ICD-10-CM | POA: Diagnosis not present

## 2019-09-22 DIAGNOSIS — M5412 Radiculopathy, cervical region: Secondary | ICD-10-CM | POA: Diagnosis not present

## 2020-03-23 DIAGNOSIS — F411 Generalized anxiety disorder: Secondary | ICD-10-CM | POA: Diagnosis not present

## 2020-03-23 DIAGNOSIS — I739 Peripheral vascular disease, unspecified: Secondary | ICD-10-CM | POA: Diagnosis not present

## 2020-03-23 DIAGNOSIS — Z23 Encounter for immunization: Secondary | ICD-10-CM | POA: Diagnosis not present

## 2020-03-23 DIAGNOSIS — M5030 Other cervical disc degeneration, unspecified cervical region: Secondary | ICD-10-CM | POA: Diagnosis not present

## 2020-03-23 DIAGNOSIS — Z79899 Other long term (current) drug therapy: Secondary | ICD-10-CM | POA: Diagnosis not present

## 2020-03-23 DIAGNOSIS — M129 Arthropathy, unspecified: Secondary | ICD-10-CM | POA: Diagnosis not present

## 2020-03-23 DIAGNOSIS — I779 Disorder of arteries and arterioles, unspecified: Secondary | ICD-10-CM | POA: Diagnosis not present

## 2020-03-28 DIAGNOSIS — M5030 Other cervical disc degeneration, unspecified cervical region: Secondary | ICD-10-CM | POA: Diagnosis not present

## 2020-03-29 DIAGNOSIS — M5030 Other cervical disc degeneration, unspecified cervical region: Secondary | ICD-10-CM | POA: Diagnosis not present

## 2020-03-30 DIAGNOSIS — M5030 Other cervical disc degeneration, unspecified cervical region: Secondary | ICD-10-CM | POA: Diagnosis not present

## 2020-03-31 DIAGNOSIS — M5030 Other cervical disc degeneration, unspecified cervical region: Secondary | ICD-10-CM | POA: Diagnosis not present

## 2020-04-01 DIAGNOSIS — M5030 Other cervical disc degeneration, unspecified cervical region: Secondary | ICD-10-CM | POA: Diagnosis not present

## 2020-04-24 DIAGNOSIS — F411 Generalized anxiety disorder: Secondary | ICD-10-CM | POA: Diagnosis not present

## 2020-04-24 DIAGNOSIS — M5030 Other cervical disc degeneration, unspecified cervical region: Secondary | ICD-10-CM | POA: Diagnosis not present

## 2020-04-24 DIAGNOSIS — R9431 Abnormal electrocardiogram [ECG] [EKG]: Secondary | ICD-10-CM | POA: Diagnosis not present

## 2020-04-24 DIAGNOSIS — Z79899 Other long term (current) drug therapy: Secondary | ICD-10-CM | POA: Diagnosis not present

## 2020-04-24 DIAGNOSIS — I340 Nonrheumatic mitral (valve) insufficiency: Secondary | ICD-10-CM | POA: Diagnosis not present

## 2020-04-24 DIAGNOSIS — M5412 Radiculopathy, cervical region: Secondary | ICD-10-CM | POA: Diagnosis not present

## 2020-05-07 DIAGNOSIS — J039 Acute tonsillitis, unspecified: Secondary | ICD-10-CM | POA: Diagnosis not present

## 2020-05-21 DIAGNOSIS — M5412 Radiculopathy, cervical region: Secondary | ICD-10-CM | POA: Diagnosis not present

## 2020-05-21 DIAGNOSIS — R630 Anorexia: Secondary | ICD-10-CM | POA: Diagnosis not present

## 2020-05-21 DIAGNOSIS — M5030 Other cervical disc degeneration, unspecified cervical region: Secondary | ICD-10-CM | POA: Diagnosis not present

## 2020-05-21 DIAGNOSIS — F411 Generalized anxiety disorder: Secondary | ICD-10-CM | POA: Diagnosis not present

## 2020-05-21 DIAGNOSIS — Z79899 Other long term (current) drug therapy: Secondary | ICD-10-CM | POA: Diagnosis not present

## 2020-06-28 DIAGNOSIS — M545 Low back pain, unspecified: Secondary | ICD-10-CM | POA: Diagnosis not present

## 2020-06-28 DIAGNOSIS — J029 Acute pharyngitis, unspecified: Secondary | ICD-10-CM | POA: Diagnosis not present

## 2020-06-28 DIAGNOSIS — S0990XA Unspecified injury of head, initial encounter: Secondary | ICD-10-CM | POA: Diagnosis not present

## 2020-06-28 DIAGNOSIS — E876 Hypokalemia: Secondary | ICD-10-CM | POA: Diagnosis not present

## 2020-06-28 DIAGNOSIS — M542 Cervicalgia: Secondary | ICD-10-CM | POA: Diagnosis not present

## 2020-06-28 DIAGNOSIS — Z20822 Contact with and (suspected) exposure to covid-19: Secondary | ICD-10-CM | POA: Diagnosis not present

## 2020-06-28 DIAGNOSIS — F1490 Cocaine use, unspecified, uncomplicated: Secondary | ICD-10-CM | POA: Diagnosis not present

## 2020-06-28 DIAGNOSIS — S199XXA Unspecified injury of neck, initial encounter: Secondary | ICD-10-CM | POA: Diagnosis not present

## 2020-06-28 DIAGNOSIS — M79604 Pain in right leg: Secondary | ICD-10-CM | POA: Diagnosis not present

## 2020-07-03 DIAGNOSIS — Z79899 Other long term (current) drug therapy: Secondary | ICD-10-CM | POA: Diagnosis not present

## 2020-07-03 DIAGNOSIS — R630 Anorexia: Secondary | ICD-10-CM | POA: Diagnosis not present

## 2020-07-03 DIAGNOSIS — M5030 Other cervical disc degeneration, unspecified cervical region: Secondary | ICD-10-CM | POA: Diagnosis not present

## 2020-07-03 DIAGNOSIS — F411 Generalized anxiety disorder: Secondary | ICD-10-CM | POA: Diagnosis not present

## 2020-07-03 DIAGNOSIS — M5412 Radiculopathy, cervical region: Secondary | ICD-10-CM | POA: Diagnosis not present

## 2020-07-11 DIAGNOSIS — M25552 Pain in left hip: Secondary | ICD-10-CM | POA: Diagnosis not present

## 2020-07-11 DIAGNOSIS — Z96641 Presence of right artificial hip joint: Secondary | ICD-10-CM | POA: Diagnosis not present

## 2020-08-13 ENCOUNTER — Other Ambulatory Visit: Payer: Self-pay | Admitting: Surgery

## 2020-08-13 DIAGNOSIS — Z79899 Other long term (current) drug therapy: Secondary | ICD-10-CM | POA: Diagnosis not present

## 2020-08-13 DIAGNOSIS — M5030 Other cervical disc degeneration, unspecified cervical region: Secondary | ICD-10-CM | POA: Diagnosis not present

## 2020-08-13 DIAGNOSIS — R251 Tremor, unspecified: Secondary | ICD-10-CM | POA: Diagnosis not present

## 2020-08-13 DIAGNOSIS — R1031 Right lower quadrant pain: Secondary | ICD-10-CM | POA: Diagnosis not present

## 2020-08-13 DIAGNOSIS — R630 Anorexia: Secondary | ICD-10-CM | POA: Diagnosis not present

## 2020-08-13 DIAGNOSIS — R1032 Left lower quadrant pain: Secondary | ICD-10-CM | POA: Diagnosis not present

## 2020-09-14 DIAGNOSIS — E860 Dehydration: Secondary | ICD-10-CM | POA: Diagnosis not present

## 2020-09-14 DIAGNOSIS — R440 Auditory hallucinations: Secondary | ICD-10-CM | POA: Diagnosis not present

## 2020-09-14 DIAGNOSIS — R443 Hallucinations, unspecified: Secondary | ICD-10-CM | POA: Diagnosis not present

## 2020-09-14 DIAGNOSIS — J449 Chronic obstructive pulmonary disease, unspecified: Secondary | ICD-10-CM | POA: Diagnosis not present

## 2020-09-14 DIAGNOSIS — R300 Dysuria: Secondary | ICD-10-CM | POA: Diagnosis not present

## 2020-09-14 DIAGNOSIS — R442 Other hallucinations: Secondary | ICD-10-CM | POA: Diagnosis not present

## 2020-09-14 DIAGNOSIS — F315 Bipolar disorder, current episode depressed, severe, with psychotic features: Secondary | ICD-10-CM | POA: Diagnosis not present

## 2020-09-14 DIAGNOSIS — R39198 Other difficulties with micturition: Secondary | ICD-10-CM | POA: Diagnosis not present

## 2020-09-14 DIAGNOSIS — F17210 Nicotine dependence, cigarettes, uncomplicated: Secondary | ICD-10-CM | POA: Diagnosis not present

## 2020-09-14 DIAGNOSIS — F29 Unspecified psychosis not due to a substance or known physiological condition: Secondary | ICD-10-CM | POA: Diagnosis not present

## 2020-09-14 DIAGNOSIS — K219 Gastro-esophageal reflux disease without esophagitis: Secondary | ICD-10-CM | POA: Diagnosis not present

## 2020-09-14 DIAGNOSIS — I700 Atherosclerosis of aorta: Secondary | ICD-10-CM | POA: Diagnosis not present

## 2020-09-14 DIAGNOSIS — E039 Hypothyroidism, unspecified: Secondary | ICD-10-CM | POA: Diagnosis not present

## 2020-09-14 DIAGNOSIS — N281 Cyst of kidney, acquired: Secondary | ICD-10-CM | POA: Diagnosis not present

## 2020-09-15 DIAGNOSIS — K219 Gastro-esophageal reflux disease without esophagitis: Secondary | ICD-10-CM | POA: Diagnosis not present

## 2020-09-15 DIAGNOSIS — F17210 Nicotine dependence, cigarettes, uncomplicated: Secondary | ICD-10-CM | POA: Diagnosis not present

## 2020-09-15 DIAGNOSIS — R443 Hallucinations, unspecified: Secondary | ICD-10-CM | POA: Diagnosis not present

## 2020-09-15 DIAGNOSIS — E860 Dehydration: Secondary | ICD-10-CM | POA: Diagnosis not present

## 2020-09-15 DIAGNOSIS — E039 Hypothyroidism, unspecified: Secondary | ICD-10-CM | POA: Diagnosis not present

## 2020-09-15 DIAGNOSIS — R440 Auditory hallucinations: Secondary | ICD-10-CM | POA: Diagnosis not present

## 2020-09-15 DIAGNOSIS — J449 Chronic obstructive pulmonary disease, unspecified: Secondary | ICD-10-CM | POA: Diagnosis not present

## 2020-09-15 DIAGNOSIS — R39198 Other difficulties with micturition: Secondary | ICD-10-CM | POA: Diagnosis not present

## 2020-09-15 DIAGNOSIS — F315 Bipolar disorder, current episode depressed, severe, with psychotic features: Secondary | ICD-10-CM | POA: Diagnosis not present

## 2020-09-16 DIAGNOSIS — K219 Gastro-esophageal reflux disease without esophagitis: Secondary | ICD-10-CM | POA: Diagnosis not present

## 2020-09-16 DIAGNOSIS — E860 Dehydration: Secondary | ICD-10-CM | POA: Diagnosis not present

## 2020-09-16 DIAGNOSIS — J449 Chronic obstructive pulmonary disease, unspecified: Secondary | ICD-10-CM | POA: Diagnosis not present

## 2020-09-16 DIAGNOSIS — E039 Hypothyroidism, unspecified: Secondary | ICD-10-CM | POA: Diagnosis not present

## 2020-09-16 DIAGNOSIS — F17210 Nicotine dependence, cigarettes, uncomplicated: Secondary | ICD-10-CM | POA: Diagnosis not present

## 2020-09-16 DIAGNOSIS — R440 Auditory hallucinations: Secondary | ICD-10-CM | POA: Diagnosis not present

## 2020-09-16 DIAGNOSIS — F315 Bipolar disorder, current episode depressed, severe, with psychotic features: Secondary | ICD-10-CM | POA: Diagnosis not present

## 2020-09-16 DIAGNOSIS — R443 Hallucinations, unspecified: Secondary | ICD-10-CM | POA: Diagnosis not present

## 2020-09-16 DIAGNOSIS — R39198 Other difficulties with micturition: Secondary | ICD-10-CM | POA: Diagnosis not present

## 2020-09-17 DIAGNOSIS — E860 Dehydration: Secondary | ICD-10-CM | POA: Diagnosis not present

## 2020-09-17 DIAGNOSIS — R443 Hallucinations, unspecified: Secondary | ICD-10-CM | POA: Diagnosis not present

## 2020-09-17 DIAGNOSIS — K219 Gastro-esophageal reflux disease without esophagitis: Secondary | ICD-10-CM | POA: Diagnosis not present

## 2020-09-17 DIAGNOSIS — J449 Chronic obstructive pulmonary disease, unspecified: Secondary | ICD-10-CM | POA: Diagnosis not present

## 2020-09-17 DIAGNOSIS — E039 Hypothyroidism, unspecified: Secondary | ICD-10-CM | POA: Diagnosis not present

## 2020-09-17 DIAGNOSIS — R39198 Other difficulties with micturition: Secondary | ICD-10-CM | POA: Diagnosis not present

## 2020-09-17 DIAGNOSIS — F17210 Nicotine dependence, cigarettes, uncomplicated: Secondary | ICD-10-CM | POA: Diagnosis not present

## 2020-09-17 DIAGNOSIS — R440 Auditory hallucinations: Secondary | ICD-10-CM | POA: Diagnosis not present

## 2020-09-17 DIAGNOSIS — F315 Bipolar disorder, current episode depressed, severe, with psychotic features: Secondary | ICD-10-CM | POA: Diagnosis not present

## 2020-09-18 DIAGNOSIS — F17210 Nicotine dependence, cigarettes, uncomplicated: Secondary | ICD-10-CM | POA: Diagnosis not present

## 2020-09-18 DIAGNOSIS — J449 Chronic obstructive pulmonary disease, unspecified: Secondary | ICD-10-CM | POA: Diagnosis not present

## 2020-09-18 DIAGNOSIS — E039 Hypothyroidism, unspecified: Secondary | ICD-10-CM | POA: Diagnosis not present

## 2020-09-18 DIAGNOSIS — K219 Gastro-esophageal reflux disease without esophagitis: Secondary | ICD-10-CM | POA: Diagnosis not present

## 2020-09-18 DIAGNOSIS — R443 Hallucinations, unspecified: Secondary | ICD-10-CM | POA: Diagnosis not present

## 2020-09-18 DIAGNOSIS — R39198 Other difficulties with micturition: Secondary | ICD-10-CM | POA: Diagnosis not present

## 2020-09-18 DIAGNOSIS — E860 Dehydration: Secondary | ICD-10-CM | POA: Diagnosis not present

## 2020-09-18 DIAGNOSIS — R440 Auditory hallucinations: Secondary | ICD-10-CM | POA: Diagnosis not present

## 2020-09-18 DIAGNOSIS — F315 Bipolar disorder, current episode depressed, severe, with psychotic features: Secondary | ICD-10-CM | POA: Diagnosis not present

## 2020-09-19 DIAGNOSIS — R443 Hallucinations, unspecified: Secondary | ICD-10-CM | POA: Diagnosis not present

## 2020-09-19 DIAGNOSIS — R440 Auditory hallucinations: Secondary | ICD-10-CM | POA: Diagnosis not present

## 2020-09-19 DIAGNOSIS — F315 Bipolar disorder, current episode depressed, severe, with psychotic features: Secondary | ICD-10-CM | POA: Diagnosis not present

## 2020-09-19 DIAGNOSIS — R39198 Other difficulties with micturition: Secondary | ICD-10-CM | POA: Diagnosis not present

## 2020-09-19 DIAGNOSIS — K219 Gastro-esophageal reflux disease without esophagitis: Secondary | ICD-10-CM | POA: Diagnosis not present

## 2020-09-19 DIAGNOSIS — E860 Dehydration: Secondary | ICD-10-CM | POA: Diagnosis not present

## 2020-09-19 DIAGNOSIS — E039 Hypothyroidism, unspecified: Secondary | ICD-10-CM | POA: Diagnosis not present

## 2020-09-19 DIAGNOSIS — J449 Chronic obstructive pulmonary disease, unspecified: Secondary | ICD-10-CM | POA: Diagnosis not present

## 2020-09-19 DIAGNOSIS — F17210 Nicotine dependence, cigarettes, uncomplicated: Secondary | ICD-10-CM | POA: Diagnosis not present

## 2020-09-20 DIAGNOSIS — R079 Chest pain, unspecified: Secondary | ICD-10-CM | POA: Diagnosis not present

## 2020-09-20 DIAGNOSIS — F310 Bipolar disorder, current episode hypomanic: Secondary | ICD-10-CM | POA: Diagnosis not present

## 2020-09-20 DIAGNOSIS — G8929 Other chronic pain: Secondary | ICD-10-CM | POA: Diagnosis not present

## 2020-09-20 DIAGNOSIS — F315 Bipolar disorder, current episode depressed, severe, with psychotic features: Secondary | ICD-10-CM | POA: Diagnosis not present

## 2020-09-20 DIAGNOSIS — R443 Hallucinations, unspecified: Secondary | ICD-10-CM | POA: Diagnosis not present

## 2020-09-20 DIAGNOSIS — R440 Auditory hallucinations: Secondary | ICD-10-CM | POA: Diagnosis not present

## 2020-09-20 DIAGNOSIS — M1990 Unspecified osteoarthritis, unspecified site: Secondary | ICD-10-CM | POA: Diagnosis not present

## 2020-09-20 DIAGNOSIS — K219 Gastro-esophageal reflux disease without esophagitis: Secondary | ICD-10-CM | POA: Diagnosis not present

## 2020-09-20 DIAGNOSIS — F17210 Nicotine dependence, cigarettes, uncomplicated: Secondary | ICD-10-CM | POA: Diagnosis not present

## 2020-09-20 DIAGNOSIS — R39198 Other difficulties with micturition: Secondary | ICD-10-CM | POA: Diagnosis not present

## 2020-09-20 DIAGNOSIS — F1520 Other stimulant dependence, uncomplicated: Secondary | ICD-10-CM | POA: Diagnosis not present

## 2020-09-20 DIAGNOSIS — J449 Chronic obstructive pulmonary disease, unspecified: Secondary | ICD-10-CM | POA: Diagnosis not present

## 2020-09-20 DIAGNOSIS — R45851 Suicidal ideations: Secondary | ICD-10-CM | POA: Diagnosis not present

## 2020-09-20 DIAGNOSIS — E039 Hypothyroidism, unspecified: Secondary | ICD-10-CM | POA: Diagnosis not present

## 2020-09-20 DIAGNOSIS — R0789 Other chest pain: Secondary | ICD-10-CM | POA: Diagnosis not present

## 2020-09-20 DIAGNOSIS — E860 Dehydration: Secondary | ICD-10-CM | POA: Diagnosis not present

## 2020-09-20 DIAGNOSIS — E7800 Pure hypercholesterolemia, unspecified: Secondary | ICD-10-CM | POA: Diagnosis not present

## 2020-09-25 DIAGNOSIS — R0789 Other chest pain: Secondary | ICD-10-CM | POA: Diagnosis not present

## 2020-09-25 DIAGNOSIS — R45851 Suicidal ideations: Secondary | ICD-10-CM | POA: Diagnosis not present

## 2020-09-25 DIAGNOSIS — R079 Chest pain, unspecified: Secondary | ICD-10-CM | POA: Diagnosis not present

## 2020-09-26 DIAGNOSIS — F310 Bipolar disorder, current episode hypomanic: Secondary | ICD-10-CM | POA: Diagnosis not present

## 2020-10-10 DIAGNOSIS — R251 Tremor, unspecified: Secondary | ICD-10-CM | POA: Diagnosis not present

## 2020-10-10 DIAGNOSIS — R630 Anorexia: Secondary | ICD-10-CM | POA: Diagnosis not present

## 2020-10-10 DIAGNOSIS — M5030 Other cervical disc degeneration, unspecified cervical region: Secondary | ICD-10-CM | POA: Diagnosis not present

## 2020-10-10 DIAGNOSIS — Z79899 Other long term (current) drug therapy: Secondary | ICD-10-CM | POA: Diagnosis not present

## 2020-10-23 ENCOUNTER — Encounter (HOSPITAL_COMMUNITY): Payer: Self-pay

## 2020-10-23 ENCOUNTER — Other Ambulatory Visit: Payer: Self-pay

## 2020-10-23 ENCOUNTER — Emergency Department (HOSPITAL_COMMUNITY)
Admission: EM | Admit: 2020-10-23 | Discharge: 2020-10-25 | Disposition: A | Discharge status: Home / Self Care | Payer: 59 | Attending: Emergency Medicine | Admitting: Emergency Medicine

## 2020-10-23 DIAGNOSIS — Z20822 Contact with and (suspected) exposure to covid-19: Secondary | ICD-10-CM | POA: Insufficient documentation

## 2020-10-23 DIAGNOSIS — I129 Hypertensive chronic kidney disease with stage 1 through stage 4 chronic kidney disease, or unspecified chronic kidney disease: Secondary | ICD-10-CM | POA: Diagnosis not present

## 2020-10-23 DIAGNOSIS — R45851 Suicidal ideations: Secondary | ICD-10-CM | POA: Insufficient documentation

## 2020-10-23 DIAGNOSIS — Z96641 Presence of right artificial hip joint: Secondary | ICD-10-CM | POA: Insufficient documentation

## 2020-10-23 DIAGNOSIS — Z79899 Other long term (current) drug therapy: Secondary | ICD-10-CM | POA: Insufficient documentation

## 2020-10-23 DIAGNOSIS — F17210 Nicotine dependence, cigarettes, uncomplicated: Secondary | ICD-10-CM | POA: Diagnosis not present

## 2020-10-23 DIAGNOSIS — R441 Visual hallucinations: Secondary | ICD-10-CM | POA: Diagnosis not present

## 2020-10-23 DIAGNOSIS — N189 Chronic kidney disease, unspecified: Secondary | ICD-10-CM | POA: Insufficient documentation

## 2020-10-23 DIAGNOSIS — E039 Hypothyroidism, unspecified: Secondary | ICD-10-CM | POA: Diagnosis not present

## 2020-10-23 DIAGNOSIS — I2510 Atherosclerotic heart disease of native coronary artery without angina pectoris: Secondary | ICD-10-CM | POA: Insufficient documentation

## 2020-10-23 DIAGNOSIS — Z7982 Long term (current) use of aspirin: Secondary | ICD-10-CM | POA: Insufficient documentation

## 2020-10-23 LAB — CBC WITH DIFFERENTIAL/PLATELET
Abs Immature Granulocytes: 0.12 10*3/uL — ABNORMAL HIGH (ref 0.00–0.07)
Basophils Absolute: 0.1 10*3/uL (ref 0.0–0.1)
Basophils Relative: 0 %
Eosinophils Absolute: 0.3 10*3/uL (ref 0.0–0.5)
Eosinophils Relative: 2 %
HCT: 48.1 % (ref 39.0–52.0)
Hemoglobin: 15.7 g/dL (ref 13.0–17.0)
Immature Granulocytes: 1 %
Lymphocytes Relative: 14 %
Lymphs Abs: 2 10*3/uL (ref 0.7–4.0)
MCH: 29.8 pg (ref 26.0–34.0)
MCHC: 32.6 g/dL (ref 30.0–36.0)
MCV: 91.3 fL (ref 80.0–100.0)
Monocytes Absolute: 1.1 10*3/uL — ABNORMAL HIGH (ref 0.1–1.0)
Monocytes Relative: 8 %
Neutro Abs: 10.5 10*3/uL — ABNORMAL HIGH (ref 1.7–7.7)
Neutrophils Relative %: 75 %
Platelets: 374 10*3/uL (ref 150–400)
RBC: 5.27 MIL/uL (ref 4.22–5.81)
RDW: 13.1 % (ref 11.5–15.5)
WBC: 14 10*3/uL — ABNORMAL HIGH (ref 4.0–10.5)
nRBC: 0 % (ref 0.0–0.2)

## 2020-10-23 LAB — RESP PANEL BY RT-PCR (FLU A&B, COVID) ARPGX2
Influenza A by PCR: NEGATIVE
Influenza B by PCR: NEGATIVE
SARS Coronavirus 2 by RT PCR: NEGATIVE

## 2020-10-23 LAB — RAPID URINE DRUG SCREEN, HOSP PERFORMED
Amphetamines: POSITIVE — AB
Barbiturates: NOT DETECTED
Benzodiazepines: POSITIVE — AB
Cocaine: POSITIVE — AB
Opiates: POSITIVE — AB
Tetrahydrocannabinol: NOT DETECTED

## 2020-10-23 LAB — COMPREHENSIVE METABOLIC PANEL
ALT: 38 U/L (ref 0–44)
AST: 31 U/L (ref 15–41)
Albumin: 4.3 g/dL (ref 3.5–5.0)
Alkaline Phosphatase: 103 U/L (ref 38–126)
Anion gap: 12 (ref 5–15)
BUN: 29 mg/dL — ABNORMAL HIGH (ref 6–20)
CO2: 21 mmol/L — ABNORMAL LOW (ref 22–32)
Calcium: 9.2 mg/dL (ref 8.9–10.3)
Chloride: 103 mmol/L (ref 98–111)
Creatinine, Ser: 1.16 mg/dL (ref 0.61–1.24)
GFR, Estimated: >60 mL/min (ref >60)
Glucose, Bld: 101 mg/dL — ABNORMAL HIGH (ref 70–99)
Potassium: 4.3 mmol/L (ref 3.5–5.1)
Sodium: 136 mmol/L (ref 135–145)
Total Bilirubin: 0.3 mg/dL (ref 0.3–1.2)
Total Protein: 8 g/dL (ref 6.5–8.1)

## 2020-10-23 LAB — ACETAMINOPHEN LEVEL: Acetaminophen (Tylenol), Serum: <10 ug/mL — ABNORMAL LOW (ref 10–30)

## 2020-10-23 LAB — SALICYLATE LEVEL: Salicylate Lvl: <7 mg/dL — ABNORMAL LOW (ref 7.0–30.0)

## 2020-10-23 LAB — ETHANOL: Alcohol, Ethyl (B): <10 mg/dL (ref <10)

## 2020-10-23 MED ORDER — DIAZEPAM 5 MG PO TABS
10.0000 mg | ORAL_TABLET | Freq: Three times a day (TID) | ORAL | Status: DC
  Administered 2020-10-23 – 2020-10-25 (×5): 10 mg via ORAL
  Filled 2020-10-23 (×5): qty 2

## 2020-10-23 MED ORDER — BENZTROPINE MESYLATE 0.5 MG PO TABS
0.5000 mg | ORAL_TABLET | Freq: Two times a day (BID) | ORAL | Status: DC | PRN
  Administered 2020-10-25: 0.5 mg via ORAL
  Filled 2020-10-23: qty 1

## 2020-10-23 MED ORDER — LEVOTHYROXINE SODIUM 50 MCG PO TABS
50.0000 ug | ORAL_TABLET | Freq: Every day | ORAL | Status: DC
  Administered 2020-10-24 – 2020-10-25 (×2): 50 ug via ORAL
  Filled 2020-10-23 (×2): qty 1

## 2020-10-23 MED ORDER — PANTOPRAZOLE SODIUM 40 MG PO TBEC
40.0000 mg | DELAYED_RELEASE_TABLET | Freq: Every day | ORAL | Status: DC
  Administered 2020-10-24 – 2020-10-25 (×2): 40 mg via ORAL
  Filled 2020-10-23 (×2): qty 1

## 2020-10-23 MED ORDER — ATENOLOL-CHLORTHALIDONE 100-25 MG PO TABS: 1.0000 | ORAL_TABLET | Freq: Every day | ORAL | Status: DC

## 2020-10-23 MED ORDER — LAMOTRIGINE 25 MG PO TABS
50.0000 mg | ORAL_TABLET | Freq: Two times a day (BID) | ORAL | Status: DC
  Administered 2020-10-23 – 2020-10-25 (×4): 50 mg via ORAL
  Filled 2020-10-23 (×4): qty 2

## 2020-10-23 MED ORDER — ASPIRIN 81 MG PO TBEC: 81.0000 mg | DELAYED_RELEASE_TABLET | Freq: Two times a day (BID) | ORAL | Status: DC

## 2020-10-23 MED ORDER — PROPRANOLOL HCL ER 60 MG PO CP24
120.0000 mg | ORAL_CAPSULE | Freq: Every day | ORAL | Status: DC
  Filled 2020-10-23: qty 2
  Filled 2020-10-23: qty 1

## 2020-10-23 MED ORDER — OLANZAPINE 5 MG PO TABS
15.0000 mg | ORAL_TABLET | Freq: Every day | ORAL | Status: DC
  Administered 2020-10-23 – 2020-10-24 (×2): 15 mg via ORAL
  Filled 2020-10-23 (×2): qty 1

## 2020-10-23 MED ORDER — HYDROXYZINE HCL 25 MG PO TABS: 25.0000 mg | ORAL_TABLET | Freq: Three times a day (TID) | ORAL | Status: DC | PRN

## 2020-10-23 MED ORDER — NICOTINE 21 MG/24HR TD PT24
21.0000 mg | MEDICATED_PATCH | Freq: Every day | TRANSDERMAL | Status: DC
  Administered 2020-10-23: 21 mg via TRANSDERMAL
  Filled 2020-10-23: qty 1

## 2020-10-23 NOTE — ED Notes (Signed)
Pt unable to void at this time, pt was informed that he can provide specimen anytime he feels the need to urinate.

## 2020-10-23 NOTE — ED Notes (Signed)
Unsuccessful phlebotomy attempt

## 2020-10-23 NOTE — ED Provider Notes (Signed)
[facility] DEPT Provider Note   CSN: [id number] Arrival date & time: 10/23/20  1930     History Chief Complaint  Patient presents with  . Suicidal    [name] is a 55 y.o. male.  55 year old male presents with suicidal ideations patient was discharged from inpatient facility [facility] approximately 2 weeks ago and since that time is been trying to commit suicide by ingesting multiple substances including methamphetamine and opiates.  Patient reportedly left a suicide note to his wife this evening.  Does have a history of suicide attempt in the past.  Has been having visual hallucinations of seeing people but denies any command hallucinations of the auditory variety.        Past Medical History:  Diagnosis Date  . Accelerated hypertension 08/23/2016   pt denies this  . Anxiety   . Arthritis   . Bipolar affective disorder ([place])   . Chronic back pain   . Chronic kidney disease 2017   AKF - due to Rhabdomyolysis  . Coronary artery disease    minimal Mid LAD to Dist LAD lesion, 35% stenosed. Prox Cx lesion, 40% stenosed.  . DDD (degenerative disc disease)   . Depression   . DJD (degenerative joint disease)   . Elevated liver function tests   . Fibromyalgia   . GERD (gastroesophageal reflux disease)   . Head injury   . Head injury, closed, with concussion    x 3  from falls  . Hyperlipidemia   . Hypothyroidism   . IBS (irritable bowel syndrome)    with diarrhea  . Inguinal hernia    left  . Insomnia   . Myofascial pain syndrome   . Osteopenia   . Pneumonia   . Rhabdomyolysis   . Shortness of breath dyspnea    with exertion  . Tibial plateau fracture, left   . Tremors of nervous system     Patient Active Problem List   Diagnosis Date Noted  . Primary localized osteoarthritis of right hip 03/15/2019  . Primary osteoarthritis of right hip 03/15/2019  . Pseudoarthrosis of cervical spine ([place]) 12/23/2017  . Bipolar I  disorder, most recent episode depressed, severe with psychotic features ([place]) 10/04/2017  . Spinal stenosis of cervical region 09/21/2017  . Chronic pain syndrome 10/28/2016  . Cannabis use disorder, mild, abuse 10/28/2016  . Rhabdomyolysis 08/23/2016  . AKI (acute kidney injury) ([place]) 08/23/2016  . Transaminitis 08/23/2016  . Drug overdose, multiple drugs 08/23/2016  . Tachycardia 08/23/2016  . Accelerated hypertension 08/23/2016  . Bipolar disorder, curr episode mixed, severe, with psychotic features ([place]) 12/26/2015  . Cocaine use disorder, mild, abuse ([place]) 12/26/2015  . History of rhabdomyolysis 12/26/2015  . Liver injury 12/26/2015  . Hx of renal failure 12/26/2015  . Angina, class III ([place]) 08/18/2015  . Dyslipidemia, goal LDL below 70 08/18/2015  . Family history of premature coronary artery disease 08/18/2015  . Tobacco use disorder, continuous 08/18/2015  . Tibial plateau fracture 10/02/2013  . Multiple falls 10/02/2013  . Alteration in self-care ability 10/02/2013  . Hypothyroidism, lithium induced 10/02/2013  . Fracture of tibia with fibula, left, closed 10/02/2013  . Chronic back pain 10/02/2013  . Spinal cord stimulator, status-post 10/02/2013  . History of suicidal ideation 10/02/2013  . Postural imbalance with levoscoliosis, h/o 10/02/2013  . Degenerative disc disease, lumbar 09/07/2013  . Chest pain 12/25/2012  . Leg pain 02/24/2011  . Neck pain 02/24/2011  . Right hip pain 02/24/2011  .  IRRITABLE BOWEL SYNDROME 08/09/2008  . HYPERLIPIDEMIA 10/28/2006  . DISORDER, BIPOLAR NOS 10/28/2006  . ANXIETY 10/28/2006  . DEPRESSION 10/28/2006  . PAIN, CHRONIC NEC 10/28/2006  . GERD 10/28/2006    Past Surgical History:  Procedure Laterality Date  . ANTERIOR CERVICAL DECOMP/DISCECTOMY FUSION N/A 09/21/2017   Procedure: ANTERIOR CERVICAL DECOMPRESSION/DISCECTOMY FUSION - CERVICAL THREE-CERVICAL FOUR;  Surgeon: Kary Kos, MD;  Location: [facility];  Service: Neurosurgery;   Laterality: N/A;  . ANTERIOR CERVICAL DECOMP/DISCECTOMY FUSION N/A 12/23/2017   Procedure: REMOVAL AND REPLACEMENT OF CERVICAL THREE-FOUR HARDWARE;  Surgeon: Kary Kos, MD;  Location: [facility];  Service: Neurosurgery;  Laterality: N/A;  . CARDIAC CATHETERIZATION N/A 08/31/2015   Procedure: Left Heart Cath and Coronary Angiography;  Surgeon: Leonie Man, MD;  Location: [facility] CV LAB;  Service: Cardiovascular;  Laterality: N/A;  . Carotid Dopplers Bilateral 02/20/2015   [facility]: Mild, less than 39% left and right internal carotid artery stenosis. No significant plaque burden  . CERVICAL DISC SURGERY     C5-7  . COLONOSCOPY    . COLONOSCOPY    . ESOPHAGOGASTRODUODENOSCOPY    . FASCIOTOMY Left 10/20/2013   Procedure: LEFT leg ANTERIOR COMPARTMENT FACSCIOTOMY;  Surgeon: Rozanna Box, MD;  Location: [facility];  Service: Orthopedics;  Laterality: Left;  . INGUINAL HERNIA REPAIR Left   . Nuclear Stress Test  03/06/2015   [facility]: Normal EKG. Low normal EF (49%) normal regional wall motion. No evidence of ischemia or infarction.  . ORIF TIBIA PLATEAU Left 10/20/2013   Procedure: OPEN REDUCTION INTERNAL FIXATION (ORIF) LEFT TIBIAL PLATEAU;  Surgeon: Rozanna Box, MD;  Location: [facility];  Service: Orthopedics;  Laterality: Left;  . SEPTOPLASTY    . SPINAL CORD STIMULATOR BATTERY EXCHANGE N/A 02/08/2016   Procedure: Lumbar spinal cord stimulator implantable pulse generator replacement;  Surgeon: Clydell Hakim, MD;  Location: [facility] NEUR[facility] OR[facility];  Service: Neurosurgery;  Laterality: N/A;  . SPINAL CORD STIMULATOR BATTERY EXCHANGE N/A 12/23/2017   Procedure: SPINAL CORD STIMULATOR BATTERY EXCHANGE;  Surgeon: Clydell Hakim, MD;  Location: [facility];  Service: Neurosurgery;  Laterality: N/A;  . SPINAL CORD STIMULATOR IMPLANT    . TOTAL HIP ARTHROPLASTY Right 03/15/2019   Procedure: RIGHT TOTAL HIP ARTHROPLASTY ANTERIOR APPROACH;  Surgeon: Melrose Nakayama, MD;  Location: [facility];  Service:  Orthopedics;  Laterality: Right;  . TRANSTHORACIC ECHOCARDIOGRAM  02/20/2015   [facility]: Mild concentric LVH. EF 55-60%. Normal regional wall motion. Mild to moderate TR with no significant pulmonary hypertension.       Family History  Problem Relation Age of Onset  . Heart disease Mother   . Hypertension Mother   . Sudden death Mother        Presumably cardiac  . Hyperlipidemia Father   . Heart attack Father 86       At least 5 MIs. Had CABG.  . Heart failure Father   . Diabetes Father   . Hypertension Father   . Kidney disease Father   . Diabetes Brother   . Lung cancer Maternal Grandmother   . Diabetes Paternal Grandmother   . Sudden death Paternal Grandmother        Unclear etiology  . Diabetes Other   . Kidney disease Other   . Kidney disease Brother   . Hypertension Brother   . Sudden death Brother        Thought to be related to heart disease  . Throat cancer Brother   .  Colon cancer Neg Hx   . Mental illness Neg Hx     Social History   Tobacco Use  . Smoking status: Current Some Day Smoker    Packs/day: 0.25    Years: 40.00    Pack years: 10.00    Types: Cigarettes  . Smokeless tobacco: Never Used  Vaping Use  . Vaping Use: Never used  Substance Use Topics  . Alcohol use: No    Alcohol/week: 0.0 standard drinks    Comment: none since 1999- heavy drinker in past  . Drug use: Not Currently    Types: Oxycodone, Cocaine    Comment: rarely- last 2015-    Home Medications Prior to Admission medications   Medication Sig Start Date End Date Taking? Authorizing Provider  amantadine (SYMMETREL) 100 MG capsule Take 50 mg by mouth 2 (two) times daily.     [provider]  amitriptyline (ELAVIL) 25 MG tablet Take 3 tablets (75 mg total) by mouth at bedtime. For insomnia/depression Patient taking differently: Take 75 mg by mouth at bedtime.  10/08/17   Lindell Spar I, NP  aspirin 81 MG EC tablet Take 1 tablet (81 mg total) by mouth 2 (two)  times daily after a meal. For heart health 03/16/19   Loni Dolly, PA-C  atenolol-chlorthalidone (TENORETIC) 100-25 MG tablet Take 1 tablet by mouth daily.    [provider]  benztropine (COGENTIN) 0.5 MG tablet Take 1 tablet (0.5 mg total) by mouth 2 (two) times daily as needed (EPS). Patient not taking: Reported on 12/11/2017 10/08/17   Lindell Spar I, NP  buPROPion (WELLBUTRIN XL) 150 MG 24 hr tablet Take 1 tablet (150 mg total) by mouth daily. For depression Patient taking differently: Take 150 mg by mouth daily.  10/08/17   Lindell Spar I, NP  busPIRone (BUSPAR) 15 MG tablet Take 1 tablet (15 mg total) by mouth 2 (two) times daily. For anxiety Patient taking differently: Take 15 mg by mouth 2 (two) times daily.  10/08/17   Lindell Spar I, NP  cyclobenzaprine (FLEXERIL) 10 MG tablet Take 1 tablet (10 mg total) by mouth 3 (three) times daily as needed for muscle spasms. Patient not taking: Reported on 03/03/2019 10/08/17   Lindell Spar I, NP  esomeprazole (NEXIUM) 40 MG capsule Take 40 mg by mouth daily at 12 noon.    [provider]  hydrOXYzine (ATARAX/VISTARIL) 25 MG tablet Take 1 tablet (25 mg total) by mouth every 8 (eight) hours as needed for anxiety. For anxiety Patient not taking: Reported on 03/03/2019 10/08/17   Lindell Spar I, NP  hydrOXYzine (ATARAX/VISTARIL) 50 MG tablet Take 50 mg by mouth 3 (three) times daily as needed for anxiety.    [provider]  lamoTRIgine (LAMICTAL) 100 MG tablet Take 0.5 tablets (50 mg total) by mouth 2 (two) times daily. For mood stabilization Patient taking differently: Take 50 mg by mouth 2 (two) times daily.  10/08/17   Lindell Spar I, NP  levothyroxine (SYNTHROID, LEVOTHROID) 50 MCG tablet Take 1 tablet (50 mcg total) by mouth daily before breakfast. For hypothyroidism Patient taking differently: Take 50 mcg by mouth daily before breakfast.  10/08/17   Lindell Spar I, NP  milk thistle 175 MG tablet Take 350 mg by mouth daily.     [provider]  nicotine (NICODERM CQ - DOSED IN MG/24 HOURS) 14 mg/24hr patch Place 1 patch (14 mg total) onto the skin daily. (may purchase from over the counter): For smoking cessation Patient not  taking: Reported on 03/01/2018 10/09/17   Lindell Spar I, NP  nicotine (NICODERM CQ - DOSED IN MG/24 HOURS) 21 mg/24hr patch Place 21 mg onto the skin daily.     [provider]  nicotine polacrilex (COMMIT) 4 MG lozenge Take 4 mg by mouth as needed for smoking cessation.    [provider]  nitroGLYCERIN (NITROSTAT) 0.4 MG SL tablet Place 1 tablet (0.4 mg total) under the tongue every 5 (five) minutes as needed for chest pain. 10/08/17   Lindell Spar I, NP  OLANZapine (ZYPREXA) 20 MG tablet Take 1 tablet (20 mg total) by mouth at bedtime. For mood control Patient taking differently: Take 20 mg by mouth at bedtime.  10/08/17   Lindell Spar I, NP  Omega-3 Fatty Acids (FISH OIL PO) Take 2,000 mg by mouth 2 (two) times daily.    [provider]  oxyCODONE (OXY IR/ROXICODONE) 5 MG immediate release tablet Take 1 tablet (5 mg total) by mouth every 6 (six) hours as needed for moderate pain (for post op pain in addition to baseline pain meds.). 03/16/19   Loni Dolly, PA-C  oxyCODONE (ROXICODONE) 15 MG immediate release tablet Take 1 tablet (15 mg total) by mouth 4 (four) times daily. For chronic pain 10/08/17   Lindell Spar I, NP  pantoprazole (PROTONIX) 40 MG tablet Take 1 tablet (40 mg total) by mouth daily at 12 noon. For acid reflux Patient not taking: Reported on 03/03/2019 10/09/17   Lindell Spar I, NP  promethazine (PHENERGAN) 25 MG tablet Take 25 mg by mouth every 8 (eight) hours as needed for nausea or vomiting.    [provider]  Vitamin D, Ergocalciferol, (DRISDOL) 50000 units CAPS capsule Take 1 capsule (50,000 Units total) by mouth every Thursday. For calcium supplementation Patient taking differently: Take 50,000 Units by mouth every Thursday.  10/15/17    Lindell Spar I, NP    Allergies    Zolpidem tartrate, Lyrica [pregabalin], and Demerol [meperidine]  Review of Systems   Review of Systems  All other systems reviewed and are negative.   Physical Exam Updated Vital Signs BP (!) 143/110 (BP Location: Left Arm)   Pulse (!) 110   Temp 98 F (36.7 C) (Oral)   Resp 18   Wt 97.5 kg   SpO2 96%   BMI 29.16 kg/m   Physical Exam Vitals and nursing note reviewed.  Constitutional:      General: He is not in acute distress.    Appearance: Normal appearance. He is well-developed. He is not toxic-appearing.  HENT:     Head: Normocephalic and atraumatic.  Eyes:     General: Lids are normal.     Conjunctiva/sclera: Conjunctivae normal.     Pupils: Pupils are equal, round, and reactive to light.  Neck:     Thyroid: No thyroid mass.     Trachea: No tracheal deviation.  Cardiovascular:     Rate and Rhythm: Normal rate and regular rhythm.     Heart sounds: Normal heart sounds. No murmur heard. No gallop.   Pulmonary:     Effort: Pulmonary effort is normal. No respiratory distress.     Breath sounds: Normal breath sounds. No stridor. No decreased breath sounds, wheezing, rhonchi or rales.  Abdominal:     General: Bowel sounds are normal. There is no distension.     Palpations: Abdomen is soft.     Tenderness: There is no abdominal tenderness. There is no rebound.  Musculoskeletal:  General: No tenderness. Normal range of motion.     Cervical back: Normal range of motion and neck supple.  Skin:    General: Skin is warm and dry.     Findings: No abrasion or rash.  Neurological:     Mental Status: He is alert and oriented to person, place, and time.     GCS: GCS eye subscore is 4. GCS verbal subscore is 5. GCS motor subscore is 6.     Cranial Nerves: No cranial nerve deficit.     Sensory: No sensory deficit.  Psychiatric:        Attention and Perception: He is inattentive.        Mood and Affect: Affect is blunt and flat.         Speech: Speech is delayed.        Behavior: Behavior is slowed and withdrawn.        Thought Content: Thought content includes suicidal ideation. Thought content includes suicidal plan.     ED Results / Procedures / Treatments   Labs (all labs ordered are listed, but only abnormal results are displayed) Labs Reviewed - No data to display  EKG None  Radiology No results found.  Procedures Procedures   Medications Ordered in ED Medications - No data to display  ED Course  I have reviewed the triage vital signs and the nursing notes.  Pertinent labs & imaging results that were available during my care of the patient were reviewed by me and considered in my medical decision making (see chart for details).    MDM Rules/Calculators/A&P                          Patient to be medically clear for psychiatric disposition Final Clinical Impression(s) / ED Diagnoses Final diagnoses:  None    Rx / DC Orders ED Discharge Orders    None       Lacretia Leigh, MD 10/23/20 2031

## 2020-10-23 NOTE — ED Triage Notes (Addendum)
Pt presents with wife, reports increasing suicidal thoughts since being discharged from [facility] 2 wks. Pt states he is having auditory and visual hallucinations. Pt admits to using cocaine, oxycodone and meth over the last 3 days to help give him courage to jump out of a window. Hx suicide attempts in the past. Also states that today he had left a suicide note for wife

## 2020-10-23 NOTE — ED Notes (Signed)
Pt provided urine specimen cup, pt currently attempting to provide specimen.

## 2020-10-24 DIAGNOSIS — R45851 Suicidal ideations: Secondary | ICD-10-CM | POA: Diagnosis not present

## 2020-10-24 DIAGNOSIS — F17210 Nicotine dependence, cigarettes, uncomplicated: Secondary | ICD-10-CM | POA: Diagnosis not present

## 2020-10-24 DIAGNOSIS — Z20822 Contact with and (suspected) exposure to covid-19: Secondary | ICD-10-CM | POA: Diagnosis not present

## 2020-10-24 DIAGNOSIS — Z96641 Presence of right artificial hip joint: Secondary | ICD-10-CM | POA: Diagnosis not present

## 2020-10-24 DIAGNOSIS — I129 Hypertensive chronic kidney disease with stage 1 through stage 4 chronic kidney disease, or unspecified chronic kidney disease: Secondary | ICD-10-CM | POA: Diagnosis not present

## 2020-10-24 DIAGNOSIS — E039 Hypothyroidism, unspecified: Secondary | ICD-10-CM | POA: Diagnosis not present

## 2020-10-24 DIAGNOSIS — I2510 Atherosclerotic heart disease of native coronary artery without angina pectoris: Secondary | ICD-10-CM | POA: Diagnosis not present

## 2020-10-24 DIAGNOSIS — N189 Chronic kidney disease, unspecified: Secondary | ICD-10-CM | POA: Diagnosis not present

## 2020-10-24 DIAGNOSIS — R441 Visual hallucinations: Secondary | ICD-10-CM | POA: Diagnosis not present

## 2020-10-24 MED ORDER — OXYCODONE HCL 5 MG PO TABS
10.0000 mg | ORAL_TABLET | Freq: Four times a day (QID) | ORAL | Status: DC | PRN
Start: 2020-10-24 — End: 2020-10-25
  Administered 2020-10-24 – 2020-10-25 (×5): 10 mg via ORAL
  Filled 2020-10-24 (×5): qty 2

## 2020-10-24 NOTE — ED Notes (Signed)
TTS in progress 

## 2020-10-24 NOTE — BH Assessment (Signed)
[facility] Assessment Progress Note  Per Darrol Angel, NP, this voluntary pt requires psychiatric hospitalization at this time.  The following facilities have been contacted to seek placement for this pt, with results as noted:  Beds available, information sent, decision pending: [facility]  At capacity: [facility]   If this voluntary pt is accepted to a facility, please discuss disposition with pt to be sure that he agrees to the plan.  If a facility agrees to accept pt and the plan changes in any way please call the facility to inform them of the change.  Final disposition is pending as of this writing.  Jalene Mullet, [facility] Coordinator [phone]

## 2020-10-24 NOTE — Progress Notes (Signed)
CSW met with pt regarding transport to [facility].  Pt agreeable to using [facility], signed waiver.  CSW spoke with [place] at [facility] who entered pt into the system.  A 75 mile form was filled out and faxed, along with the waiver, to [facility] transportation.  Please call them at [phone] to confirm a pick up time for pt tomorrow. Lurline Idol, MSW, LCSW 5/25/20226:33 PM

## 2020-10-24 NOTE — ED Provider Notes (Signed)
Emergency Medicine Observation Re-evaluation Note  [name] is a 55 y.o. male, seen on rounds today.  Pt initially presented to the ED for complaints of Suicidal Currently, the patient is sleeping.  Physical Exam  BP 110/75 (BP Location: Left Arm)   Pulse (!) 106   Temp 98 F (36.7 C) (Oral)   Resp 16   Wt 97.5 kg   SpO2 96%   BMI 29.16 kg/m  Physical Exam General: NAD Cardiac: mild tachycardia Lungs: no resp distress, no tachypnea Psych: suicidal  ED Course / MDM  EKG:   I have reviewed the labs performed to date as well as medications administered while in observation.  Recent changes in the last 24 hours include none.  Plan  Current plan is for inpt treatment. Patient is not under full IVC at this time.   Blanchie Dessert, MD 10/24/20 [phone]

## 2020-10-24 NOTE — ED Notes (Signed)
Pt verbally gave consent to release information to his wife.

## 2020-10-24 NOTE — BH Assessment (Addendum)
[facility] Assessment Progress Note  Per Darrol Angel, NP, this voluntary pt requires psychiatric hospitalization at this time.  At the direction of Hampton Abbot, MD, this writer has sought outside placement.  At 15:17 Jonelle Sidle calls from [facility].  Pt has been accepted to their [facility] by Dr Jonelle Sports.  Thomes Lolling, NP, concurs with this disposition.  I have spoken to pt, who was admitted to their facility recently.  He is very reluctant to return, but in the end agrees.  EDP Milton Ferguson, MD and pt's nurse, Jenny Reichmann, have been notified, and Jenny Reichmann agrees to call report to [phone].  Pt is to be transported via [facility], and a [facility] Patient Intake Request will need to be completed and sent to the Transportation Department.  If pt changes his mind, a decision will need to be made whether to discharge him or to place him under IVC, the latter of which would change transportation to [facility].  Please call [facility] if the bed is no longer needed.  Jalene Mullet, [facility] Coordinator [phone]   Addendum:  At 16:19 I called pt's wife, Jeret Goyer ([phone]) with pt's verbal consent and informed her of disposition.  Jalene Mullet, [place] Coordinator [phone]
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
## 2020-10-24 NOTE — BH Assessment (Signed)
Pt's wife Manus Weedman, [phone]) would like an update on pt's disposition, clinician expressed the pt will have to consent before information is provided. Clinician discussed with Nash Mantis, RN.    Vertell Novak, [place], [facility], [facility] Triage Specialist [phone]

## 2020-10-25 ENCOUNTER — Other Ambulatory Visit: Payer: Self-pay | Admitting: Surgery

## 2020-10-25 DIAGNOSIS — Z96641 Presence of right artificial hip joint: Secondary | ICD-10-CM | POA: Diagnosis not present

## 2020-10-25 DIAGNOSIS — F1510 Other stimulant abuse, uncomplicated: Secondary | ICD-10-CM | POA: Diagnosis not present

## 2020-10-25 DIAGNOSIS — R109 Unspecified abdominal pain: Secondary | ICD-10-CM

## 2020-10-25 DIAGNOSIS — F1110 Opioid abuse, uncomplicated: Secondary | ICD-10-CM | POA: Diagnosis not present

## 2020-10-25 DIAGNOSIS — F17210 Nicotine dependence, cigarettes, uncomplicated: Secondary | ICD-10-CM | POA: Diagnosis not present

## 2020-10-25 DIAGNOSIS — I129 Hypertensive chronic kidney disease with stage 1 through stage 4 chronic kidney disease, or unspecified chronic kidney disease: Secondary | ICD-10-CM | POA: Diagnosis not present

## 2020-10-25 DIAGNOSIS — E039 Hypothyroidism, unspecified: Secondary | ICD-10-CM | POA: Diagnosis not present

## 2020-10-25 DIAGNOSIS — F312 Bipolar disorder, current episode manic severe with psychotic features: Secondary | ICD-10-CM | POA: Diagnosis not present

## 2020-10-25 DIAGNOSIS — F1020 Alcohol dependence, uncomplicated: Secondary | ICD-10-CM | POA: Diagnosis not present

## 2020-10-25 DIAGNOSIS — N189 Chronic kidney disease, unspecified: Secondary | ICD-10-CM | POA: Diagnosis not present

## 2020-10-25 DIAGNOSIS — R441 Visual hallucinations: Secondary | ICD-10-CM | POA: Diagnosis not present

## 2020-10-25 DIAGNOSIS — F17200 Nicotine dependence, unspecified, uncomplicated: Secondary | ICD-10-CM | POA: Diagnosis not present

## 2020-10-25 DIAGNOSIS — Z20822 Contact with and (suspected) exposure to covid-19: Secondary | ICD-10-CM | POA: Diagnosis not present

## 2020-10-25 DIAGNOSIS — F1320 Sedative, hypnotic or anxiolytic dependence, uncomplicated: Secondary | ICD-10-CM | POA: Diagnosis not present

## 2020-10-25 DIAGNOSIS — R45851 Suicidal ideations: Secondary | ICD-10-CM | POA: Diagnosis not present

## 2020-10-25 DIAGNOSIS — I2510 Atherosclerotic heart disease of native coronary artery without angina pectoris: Secondary | ICD-10-CM | POA: Diagnosis not present

## 2020-10-25 DIAGNOSIS — F1420 Cocaine dependence, uncomplicated: Secondary | ICD-10-CM | POA: Diagnosis not present

## 2020-10-25 DIAGNOSIS — F1221 Cannabis dependence, in remission: Secondary | ICD-10-CM | POA: Diagnosis not present

## 2020-10-25 NOTE — ED Notes (Signed)
Clinical research associate for transportation to [facility] [address], [place], [place] [address]

## 2020-10-25 NOTE — ED Notes (Addendum)
1 patient belonging bag returned to patient. Pt confirmed all his belongings are present. He changed into the clothes the wore to [facility]. Pt departing with [facility] to [facility]. Updated [facility] dispatch with [employer] address.

## 2020-11-02 DIAGNOSIS — N189 Chronic kidney disease, unspecified: Secondary | ICD-10-CM | POA: Diagnosis not present

## 2020-11-02 DIAGNOSIS — F17200 Nicotine dependence, unspecified, uncomplicated: Secondary | ICD-10-CM | POA: Diagnosis not present

## 2020-11-02 DIAGNOSIS — F1020 Alcohol dependence, uncomplicated: Secondary | ICD-10-CM | POA: Diagnosis not present

## 2020-11-02 DIAGNOSIS — F1320 Sedative, hypnotic or anxiolytic dependence, uncomplicated: Secondary | ICD-10-CM | POA: Diagnosis not present

## 2020-11-02 DIAGNOSIS — F1420 Cocaine dependence, uncomplicated: Secondary | ICD-10-CM | POA: Diagnosis not present

## 2020-11-02 DIAGNOSIS — F1510 Other stimulant abuse, uncomplicated: Secondary | ICD-10-CM | POA: Diagnosis not present

## 2020-11-02 DIAGNOSIS — F1110 Opioid abuse, uncomplicated: Secondary | ICD-10-CM | POA: Diagnosis not present

## 2020-11-02 DIAGNOSIS — F1221 Cannabis dependence, in remission: Secondary | ICD-10-CM | POA: Diagnosis not present

## 2020-11-02 DIAGNOSIS — F312 Bipolar disorder, current episode manic severe with psychotic features: Secondary | ICD-10-CM | POA: Diagnosis not present

## 2020-11-09 DIAGNOSIS — K219 Gastro-esophageal reflux disease without esophagitis: Secondary | ICD-10-CM | POA: Diagnosis not present

## 2020-11-09 DIAGNOSIS — Z79899 Other long term (current) drug therapy: Secondary | ICD-10-CM | POA: Diagnosis not present

## 2020-11-09 DIAGNOSIS — M5412 Radiculopathy, cervical region: Secondary | ICD-10-CM | POA: Diagnosis not present

## 2020-11-09 DIAGNOSIS — R251 Tremor, unspecified: Secondary | ICD-10-CM | POA: Diagnosis not present

## 2020-11-09 DIAGNOSIS — M5030 Other cervical disc degeneration, unspecified cervical region: Secondary | ICD-10-CM | POA: Diagnosis not present

## 2020-11-13 DIAGNOSIS — F319 Bipolar disorder, unspecified: Secondary | ICD-10-CM | POA: Diagnosis not present

## 2020-11-19 ENCOUNTER — Ambulatory Visit
Admission: RE | Admit: 2020-11-19 | Discharge: 2020-11-19 | Disposition: A | Discharge status: Home / Self Care | Payer: 59 | Source: Ambulatory Visit | Attending: Surgery | Admitting: Surgery

## 2020-11-19 DIAGNOSIS — K7689 Other specified diseases of liver: Secondary | ICD-10-CM | POA: Diagnosis not present

## 2020-11-19 DIAGNOSIS — K760 Fatty (change of) liver, not elsewhere classified: Secondary | ICD-10-CM | POA: Diagnosis not present

## 2020-11-19 DIAGNOSIS — R109 Unspecified abdominal pain: Secondary | ICD-10-CM

## 2020-11-19 DIAGNOSIS — N281 Cyst of kidney, acquired: Secondary | ICD-10-CM | POA: Diagnosis not present

## 2020-11-19 MED ORDER — IOPAMIDOL (ISOVUE-300) INJECTION 61%
100.0000 mL | Freq: Once | INTRAVENOUS | Status: AC | PRN
  Administered 2020-11-19: 100 mL via INTRAVENOUS

## 2020-11-22 DIAGNOSIS — F319 Bipolar disorder, unspecified: Secondary | ICD-10-CM | POA: Diagnosis not present

## 2020-11-25 DIAGNOSIS — F1510 Other stimulant abuse, uncomplicated: Secondary | ICD-10-CM | POA: Diagnosis not present

## 2020-11-25 DIAGNOSIS — R45851 Suicidal ideations: Secondary | ICD-10-CM | POA: Diagnosis not present

## 2020-11-25 DIAGNOSIS — R440 Auditory hallucinations: Secondary | ICD-10-CM | POA: Diagnosis not present

## 2020-11-25 DIAGNOSIS — Z79899 Other long term (current) drug therapy: Secondary | ICD-10-CM | POA: Diagnosis not present

## 2020-11-25 DIAGNOSIS — R441 Visual hallucinations: Secondary | ICD-10-CM | POA: Diagnosis not present

## 2020-11-26 DIAGNOSIS — R45851 Suicidal ideations: Secondary | ICD-10-CM | POA: Diagnosis not present

## 2020-11-26 DIAGNOSIS — Z79899 Other long term (current) drug therapy: Secondary | ICD-10-CM | POA: Diagnosis not present

## 2020-11-26 DIAGNOSIS — R441 Visual hallucinations: Secondary | ICD-10-CM | POA: Diagnosis not present

## 2020-11-26 DIAGNOSIS — R440 Auditory hallucinations: Secondary | ICD-10-CM | POA: Diagnosis not present

## 2020-11-26 DIAGNOSIS — F1510 Other stimulant abuse, uncomplicated: Secondary | ICD-10-CM | POA: Diagnosis not present

## 2020-11-27 DIAGNOSIS — R45851 Suicidal ideations: Secondary | ICD-10-CM | POA: Diagnosis not present

## 2020-11-27 DIAGNOSIS — Z635 Disruption of family by separation and divorce: Secondary | ICD-10-CM | POA: Diagnosis not present

## 2020-11-27 DIAGNOSIS — R441 Visual hallucinations: Secondary | ICD-10-CM | POA: Diagnosis not present

## 2020-11-27 DIAGNOSIS — Z79899 Other long term (current) drug therapy: Secondary | ICD-10-CM | POA: Diagnosis not present

## 2020-11-27 DIAGNOSIS — F333 Major depressive disorder, recurrent, severe with psychotic symptoms: Secondary | ICD-10-CM | POA: Diagnosis not present

## 2020-11-27 DIAGNOSIS — R440 Auditory hallucinations: Secondary | ICD-10-CM | POA: Diagnosis not present

## 2020-11-27 DIAGNOSIS — F1510 Other stimulant abuse, uncomplicated: Secondary | ICD-10-CM | POA: Diagnosis not present

## 2020-11-27 DIAGNOSIS — E7800 Pure hypercholesterolemia, unspecified: Secondary | ICD-10-CM | POA: Diagnosis not present

## 2020-11-27 DIAGNOSIS — F200 Paranoid schizophrenia: Secondary | ICD-10-CM | POA: Diagnosis not present

## 2020-11-27 DIAGNOSIS — E039 Hypothyroidism, unspecified: Secondary | ICD-10-CM | POA: Diagnosis not present

## 2020-11-27 DIAGNOSIS — G4700 Insomnia, unspecified: Secondary | ICD-10-CM | POA: Diagnosis not present

## 2020-11-27 DIAGNOSIS — F17210 Nicotine dependence, cigarettes, uncomplicated: Secondary | ICD-10-CM | POA: Diagnosis not present

## 2020-12-11 DIAGNOSIS — K219 Gastro-esophageal reflux disease without esophagitis: Secondary | ICD-10-CM | POA: Diagnosis not present

## 2020-12-11 DIAGNOSIS — Z79899 Other long term (current) drug therapy: Secondary | ICD-10-CM | POA: Diagnosis not present

## 2020-12-11 DIAGNOSIS — M8700 Idiopathic aseptic necrosis of unspecified bone: Secondary | ICD-10-CM | POA: Diagnosis not present

## 2020-12-11 DIAGNOSIS — M5030 Other cervical disc degeneration, unspecified cervical region: Secondary | ICD-10-CM | POA: Diagnosis not present

## 2020-12-11 DIAGNOSIS — R251 Tremor, unspecified: Secondary | ICD-10-CM | POA: Diagnosis not present

## 2020-12-27 ENCOUNTER — Other Ambulatory Visit (HOSPITAL_COMMUNITY): Payer: Self-pay

## 2020-12-27 DIAGNOSIS — F319 Bipolar disorder, unspecified: Secondary | ICD-10-CM | POA: Diagnosis not present

## 2020-12-27 DIAGNOSIS — M5416 Radiculopathy, lumbar region: Secondary | ICD-10-CM | POA: Diagnosis not present

## 2020-12-27 DIAGNOSIS — M542 Cervicalgia: Secondary | ICD-10-CM | POA: Diagnosis not present

## 2020-12-27 DIAGNOSIS — R030 Elevated blood-pressure reading, without diagnosis of hypertension: Secondary | ICD-10-CM | POA: Diagnosis not present

## 2020-12-27 DIAGNOSIS — Z6830 Body mass index (BMI) 30.0-30.9, adult: Secondary | ICD-10-CM | POA: Diagnosis not present

## 2020-12-28 ENCOUNTER — Other Ambulatory Visit (HOSPITAL_COMMUNITY): Payer: Self-pay

## 2020-12-28 MED ORDER — VITAMIN D (ERGOCALCIFEROL) 1.25 MG (50000 UNIT) PO CAPS
50000.0000 [IU] | ORAL_CAPSULE | ORAL | 0 refills | Status: DC
  Filled 2021-01-12: qty 4, 28d supply, fill #0

## 2020-12-28 MED ORDER — LAMOTRIGINE 100 MG PO TABS: 100.0000 mg | ORAL_TABLET | Freq: Two times a day (BID) | ORAL | 0 refills | Status: DC

## 2020-12-28 MED ORDER — PROPRANOLOL HCL ER 120 MG PO CP24
120.0000 mg | ORAL_CAPSULE | Freq: Every day | ORAL | 2 refills | Status: DC
  Filled 2021-01-12: qty 30, 30d supply, fill #0
  Filled 2021-02-11: qty 30, 30d supply, fill #1

## 2020-12-28 MED ORDER — ESOMEPRAZOLE MAGNESIUM 40 MG PO CPDR: 40.0000 mg | DELAYED_RELEASE_CAPSULE | Freq: Every day | ORAL | 0 refills | Status: DC

## 2020-12-28 MED ORDER — LAMOTRIGINE 25 MG PO TABS: 25.0000 mg | ORAL_TABLET | Freq: Two times a day (BID) | ORAL | 3 refills | Status: DC

## 2020-12-28 MED ORDER — DIVALPROEX SODIUM 250 MG PO DR TAB: 250.0000 mg | DELAYED_RELEASE_TABLET | Freq: Three times a day (TID) | ORAL | 0 refills | Status: DC

## 2020-12-28 MED ORDER — HYDROXYZINE PAMOATE 25 MG PO CAPS
25.0000 mg | ORAL_CAPSULE | Freq: Three times a day (TID) | ORAL | 0 refills | Status: DC
  Filled 2020-12-29: qty 45, 15d supply, fill #0

## 2020-12-28 MED ORDER — HYDROXYZINE HCL 25 MG PO TABS
25.0000 mg | ORAL_TABLET | Freq: Three times a day (TID) | ORAL | 3 refills | Status: DC
  Filled 2021-01-16: qty 90, 30d supply, fill #0
  Filled 2021-02-11: qty 90, 30d supply, fill #1

## 2020-12-28 MED ORDER — LEVOTHYROXINE SODIUM 50 MCG PO TABS
50.0000 ug | ORAL_TABLET | Freq: Every day | ORAL | 0 refills | Status: DC
  Filled 2021-01-12: qty 30, 30d supply, fill #0

## 2020-12-28 MED ORDER — LOXAPINE SUCCINATE 50 MG PO CAPS
50.0000 mg | ORAL_CAPSULE | Freq: Two times a day (BID) | ORAL | 0 refills | Status: DC
  Filled 2020-12-29: qty 60, 30d supply, fill #0

## 2020-12-28 MED ORDER — OLANZAPINE 20 MG PO TABS: 20.0000 mg | ORAL_TABLET | Freq: Every evening | ORAL | 3 refills | Status: DC

## 2020-12-28 MED ORDER — TRAZODONE HCL 150 MG PO TABS
150.0000 mg | ORAL_TABLET | Freq: Every day | ORAL | 3 refills | Status: DC
  Filled 2020-12-29: qty 30, 30d supply, fill #0
  Filled 2021-02-11: qty 30, 30d supply, fill #1
  Filled 2021-03-10: qty 30, 30d supply, fill #2

## 2020-12-28 MED ORDER — TRAZODONE HCL 50 MG PO TABS
50.0000 mg | ORAL_TABLET | Freq: Every evening | ORAL | 0 refills | Status: DC
  Filled 2021-01-30: qty 30, 30d supply, fill #0

## 2020-12-28 MED ORDER — BENZTROPINE MESYLATE 0.5 MG PO TABS
0.5000 mg | ORAL_TABLET | Freq: Two times a day (BID) | ORAL | 2 refills | Status: DC
  Filled 2020-12-29: qty 60, 30d supply, fill #0
  Filled 2021-01-30: qty 60, 30d supply, fill #1

## 2020-12-28 MED ORDER — OLANZAPINE 20 MG PO TABS: 20.0000 mg | ORAL_TABLET | Freq: Every evening | ORAL | 0 refills | Status: DC

## 2020-12-28 MED ORDER — DIVALPROEX SODIUM 250 MG PO DR TAB: 250.0000 mg | DELAYED_RELEASE_TABLET | Freq: Three times a day (TID) | ORAL | 3 refills | Status: DC

## 2020-12-28 MED ORDER — ESOMEPRAZOLE MAGNESIUM 40 MG PO CPDR
40.0000 mg | DELAYED_RELEASE_CAPSULE | Freq: Every day | ORAL | 0 refills | Status: DC
  Filled 2021-01-12: qty 30, 30d supply, fill #0

## 2020-12-28 MED ORDER — LAMOTRIGINE 25 MG PO TABS: 25.0000 mg | ORAL_TABLET | Freq: Two times a day (BID) | ORAL | 0 refills | Status: DC

## 2020-12-28 MED ORDER — LAMOTRIGINE 100 MG PO TABS
100.0000 mg | ORAL_TABLET | Freq: Two times a day (BID) | ORAL | 3 refills | Status: DC
  Filled 2020-12-29: qty 60, 30d supply, fill #0
  Filled 2021-01-30: qty 60, 30d supply, fill #1

## 2020-12-28 MED ORDER — LOXAPINE SUCCINATE 50 MG PO CAPS
50.0000 mg | ORAL_CAPSULE | Freq: Two times a day (BID) | ORAL | 2 refills | Status: DC
  Filled 2021-01-30: qty 60, 30d supply, fill #0

## 2020-12-29 ENCOUNTER — Other Ambulatory Visit (HOSPITAL_COMMUNITY): Payer: Self-pay

## 2021-01-01 ENCOUNTER — Other Ambulatory Visit (HOSPITAL_COMMUNITY): Payer: Self-pay

## 2021-01-12 ENCOUNTER — Other Ambulatory Visit (HOSPITAL_COMMUNITY): Payer: Self-pay

## 2021-01-14 ENCOUNTER — Other Ambulatory Visit (HOSPITAL_COMMUNITY): Payer: Self-pay

## 2021-01-14 MED ORDER — VITAMIN D (ERGOCALCIFEROL) 1.25 MG (50000 UNIT) PO CAPS
50000.0000 [IU] | ORAL_CAPSULE | ORAL | 1 refills | Status: DC
  Filled 2021-01-14: qty 4, 28d supply, fill #0
  Filled 2021-02-11: qty 4, 28d supply, fill #1

## 2021-01-16 ENCOUNTER — Other Ambulatory Visit (HOSPITAL_COMMUNITY): Payer: Self-pay

## 2021-01-21 ENCOUNTER — Other Ambulatory Visit (HOSPITAL_COMMUNITY): Payer: Self-pay

## 2021-01-21 DIAGNOSIS — M25552 Pain in left hip: Secondary | ICD-10-CM | POA: Diagnosis not present

## 2021-01-21 DIAGNOSIS — R251 Tremor, unspecified: Secondary | ICD-10-CM | POA: Diagnosis not present

## 2021-01-21 DIAGNOSIS — M8700 Idiopathic aseptic necrosis of unspecified bone: Secondary | ICD-10-CM | POA: Diagnosis not present

## 2021-01-21 DIAGNOSIS — Z79899 Other long term (current) drug therapy: Secondary | ICD-10-CM | POA: Diagnosis not present

## 2021-01-21 DIAGNOSIS — M87052 Idiopathic aseptic necrosis of left femur: Secondary | ICD-10-CM | POA: Diagnosis not present

## 2021-01-21 DIAGNOSIS — M5030 Other cervical disc degeneration, unspecified cervical region: Secondary | ICD-10-CM | POA: Diagnosis not present

## 2021-01-21 MED ORDER — OXYCODONE HCL 15 MG PO TABS
15.0000 mg | ORAL_TABLET | Freq: Four times a day (QID) | ORAL | 0 refills | Status: DC
  Filled 2021-01-21: qty 120, 30d supply, fill #0

## 2021-01-21 MED ORDER — PROPRANOLOL HCL ER 120 MG PO CP24
ORAL_CAPSULE | ORAL | 3 refills | Status: DC
  Filled 2021-01-21: qty 30, 30d supply, fill #0

## 2021-01-21 MED ORDER — NITROGLYCERIN 0.4 MG SL SUBL
SUBLINGUAL_TABLET | SUBLINGUAL | 1 refills | Status: DC
  Filled 2021-01-21: qty 25, 30d supply, fill #0

## 2021-01-21 MED ORDER — ESOMEPRAZOLE MAGNESIUM 40 MG PO CPDR
DELAYED_RELEASE_CAPSULE | ORAL | 1 refills | Status: DC
  Filled 2021-01-21: qty 30, 30d supply, fill #0

## 2021-01-25 DIAGNOSIS — Z79899 Other long term (current) drug therapy: Secondary | ICD-10-CM | POA: Diagnosis not present

## 2021-01-29 DIAGNOSIS — M5416 Radiculopathy, lumbar region: Secondary | ICD-10-CM | POA: Diagnosis not present

## 2021-01-29 DIAGNOSIS — M542 Cervicalgia: Secondary | ICD-10-CM | POA: Diagnosis not present

## 2021-01-29 DIAGNOSIS — F3131 Bipolar disorder, current episode depressed, mild: Secondary | ICD-10-CM | POA: Diagnosis not present

## 2021-01-30 ENCOUNTER — Other Ambulatory Visit (HOSPITAL_COMMUNITY): Payer: Self-pay

## 2021-01-30 ENCOUNTER — Other Ambulatory Visit: Payer: Self-pay | Admitting: Student

## 2021-01-30 DIAGNOSIS — M542 Cervicalgia: Secondary | ICD-10-CM

## 2021-01-30 DIAGNOSIS — M5416 Radiculopathy, lumbar region: Secondary | ICD-10-CM

## 2021-01-31 ENCOUNTER — Other Ambulatory Visit (HOSPITAL_COMMUNITY): Payer: Self-pay

## 2021-02-05 ENCOUNTER — Other Ambulatory Visit: Payer: 59

## 2021-02-07 ENCOUNTER — Other Ambulatory Visit: Payer: 59

## 2021-02-07 ENCOUNTER — Inpatient Hospital Stay
Admission: RE | Admit: 2021-02-07 | Discharge: 2021-02-07 | Disposition: A | Discharge status: Home / Self Care | Payer: 59 | Source: Ambulatory Visit | Attending: Student | Admitting: Student

## 2021-02-07 NOTE — Discharge Instructions (Signed)

## 2021-02-11 ENCOUNTER — Other Ambulatory Visit (HOSPITAL_COMMUNITY): Payer: Self-pay

## 2021-02-11 ENCOUNTER — Other Ambulatory Visit: Payer: Self-pay | Admitting: Orthopaedic Surgery

## 2021-02-11 DIAGNOSIS — Z01818 Encounter for other preprocedural examination: Secondary | ICD-10-CM

## 2021-02-11 MED ORDER — ESOMEPRAZOLE MAGNESIUM 40 MG PO CPDR
40.0000 mg | DELAYED_RELEASE_CAPSULE | Freq: Every day | ORAL | 1 refills | Status: DC
  Filled 2021-02-11: qty 30, 30d supply, fill #0
  Filled 2021-03-09: qty 30, 30d supply, fill #1

## 2021-02-12 ENCOUNTER — Other Ambulatory Visit (HOSPITAL_COMMUNITY): Payer: Self-pay

## 2021-02-13 ENCOUNTER — Other Ambulatory Visit (HOSPITAL_COMMUNITY): Payer: Self-pay

## 2021-02-14 ENCOUNTER — Other Ambulatory Visit (HOSPITAL_COMMUNITY): Payer: Self-pay

## 2021-02-19 ENCOUNTER — Other Ambulatory Visit (HOSPITAL_COMMUNITY): Payer: Self-pay

## 2021-02-20 ENCOUNTER — Other Ambulatory Visit (HOSPITAL_COMMUNITY): Payer: Self-pay

## 2021-02-21 ENCOUNTER — Other Ambulatory Visit (HOSPITAL_COMMUNITY): Payer: Self-pay

## 2021-02-21 DIAGNOSIS — E039 Hypothyroidism, unspecified: Secondary | ICD-10-CM | POA: Diagnosis not present

## 2021-02-21 DIAGNOSIS — R109 Unspecified abdominal pain: Secondary | ICD-10-CM | POA: Diagnosis not present

## 2021-02-21 DIAGNOSIS — F319 Bipolar disorder, unspecified: Secondary | ICD-10-CM | POA: Diagnosis not present

## 2021-02-21 DIAGNOSIS — Z0000 Encounter for general adult medical examination without abnormal findings: Secondary | ICD-10-CM | POA: Diagnosis not present

## 2021-02-21 DIAGNOSIS — M5030 Other cervical disc degeneration, unspecified cervical region: Secondary | ICD-10-CM | POA: Diagnosis not present

## 2021-02-21 DIAGNOSIS — Z79899 Other long term (current) drug therapy: Secondary | ICD-10-CM | POA: Diagnosis not present

## 2021-02-21 DIAGNOSIS — M8700 Idiopathic aseptic necrosis of unspecified bone: Secondary | ICD-10-CM | POA: Diagnosis not present

## 2021-02-21 MED ORDER — ESOMEPRAZOLE MAGNESIUM 40 MG PO CPDR
DELAYED_RELEASE_CAPSULE | ORAL | 1 refills | Status: DC
  Filled 2021-02-21 – 2021-06-13 (×2): qty 30, 30d supply, fill #0

## 2021-02-21 MED ORDER — PROPRANOLOL HCL ER 60 MG PO CP24
ORAL_CAPSULE | ORAL | 3 refills | Status: DC
  Filled 2021-02-21: qty 30, 30d supply, fill #0

## 2021-02-21 MED ORDER — LAMOTRIGINE 100 MG PO TABS
ORAL_TABLET | ORAL | 3 refills | Status: DC
  Filled 2021-02-21: qty 60, 30d supply, fill #0
  Filled 2021-03-29: qty 60, 30d supply, fill #1
  Filled 2021-05-01: qty 60, 30d supply, fill #2

## 2021-02-21 MED ORDER — BENZTROPINE MESYLATE 0.5 MG PO TABS
ORAL_TABLET | ORAL | 2 refills | Status: DC
  Filled 2021-02-21: qty 60, 30d supply, fill #0
  Filled 2021-03-29: qty 60, 30d supply, fill #1
  Filled 2021-05-01: qty 60, 30d supply, fill #2

## 2021-02-21 MED ORDER — HYDROXYZINE HCL 25 MG PO TABS
ORAL_TABLET | ORAL | 3 refills | Status: DC
  Filled 2021-02-21: qty 90, 30d supply, fill #0
  Filled 2021-04-14: qty 90, 30d supply, fill #1
  Filled 2021-05-14: qty 90, 30d supply, fill #2

## 2021-02-21 MED ORDER — LOXAPINE SUCCINATE 50 MG PO CAPS
ORAL_CAPSULE | ORAL | 2 refills | Status: DC
  Filled 2021-02-21: qty 60, 30d supply, fill #0
  Filled 2021-03-29: qty 60, 30d supply, fill #1
  Filled 2021-05-01: qty 60, 30d supply, fill #2

## 2021-02-21 MED ORDER — TRAZODONE HCL 150 MG PO TABS
ORAL_TABLET | ORAL | 3 refills | Status: DC
  Filled 2021-04-09: qty 30, 30d supply, fill #0
  Filled 2021-05-01: qty 30, 30d supply, fill #1
  Filled 2021-05-30: qty 30, 30d supply, fill #2
  Filled 2021-07-09: qty 30, 30d supply, fill #3

## 2021-02-21 MED ORDER — LEVOTHYROXINE SODIUM 50 MCG PO TABS
50.0000 ug | ORAL_TABLET | Freq: Every day | ORAL | 1 refills | Status: DC
  Filled 2021-02-21: qty 30, 30d supply, fill #0
  Filled 2021-03-22: qty 30, 30d supply, fill #1

## 2021-02-21 MED ORDER — OXYCODONE HCL 15 MG PO TABS
15.0000 mg | ORAL_TABLET | Freq: Four times a day (QID) | ORAL | 0 refills | Status: DC
  Filled 2021-02-21: qty 120, 30d supply, fill #0

## 2021-02-22 ENCOUNTER — Other Ambulatory Visit (HOSPITAL_COMMUNITY): Payer: Self-pay

## 2021-02-23 ENCOUNTER — Other Ambulatory Visit (HOSPITAL_COMMUNITY): Payer: Self-pay

## 2021-02-28 ENCOUNTER — Other Ambulatory Visit (HOSPITAL_COMMUNITY): Payer: 59

## 2021-02-28 NOTE — Patient Instructions (Signed)
DUE TO COVID-19 ONLY ONE VISITOR IS ALLOWED TO COME WITH YOU AND STAY IN THE WAITING ROOM ONLY DURING PRE OP AND PROCEDURE DAY OF SURGERY IF YOU ARE GOING HOME AFTER SURGERY. IF YOU ARE SPENDING THE NIGHT 2 PEOPLE MAY VISIT WITH YOU IN YOUR PRIVATE ROOM AFTER SURGERY UNTIL VISITING  HOURS ARE OVER AT 8:00 PM AND 1 VISITOR CAN SPEND THE NIGHT.   YOU NEED TO HAVE A COVID 19 TEST ON__9/30__THIS TEST MUST BE DONE BEFORE SURGERY,  COVID TESTING SITE  IS LOCATED AT [place], [place]. REMAIN IN YOUR CAR THIS IS A DRIVE UP TEST. AFTER YOUR COVID TEST PLEASE WEAR A MASK OUT IN PUBLIC AND SOCIAL DISTANCE AND [place] YOUR HANDS FREQUENTLY, ALSO ASK ALL YOUR CLOSE CONTACT PERSONS TO WEAR A MASK AND SOCIAL DISTANCE AND [place] THEIR HANDS FREQUENTLY ALSO.               [name]     Your procedure is scheduled on: 03/05/21   Report to [facility] Main  Entrance   Report to Short stay at 5:15 AM     Call this number if you have problems the morning of surgery [phone]    No food after midnight.    You may have clear liquid until 4:30 AM.    At 4:00 AM drink pre surgery drink.   Nothing by mouth after 4:30 AM.   CLEAR LIQUID DIET   Foods Allowed                                                                     Foods Excluded                           liquids that you cannot  Plain Jell-O any favor except red or purple                                           see through such as: Fruit ices (not with fruit pulp)                                     milk, soups, orange juice  Iced Popsicles                                    All solid food Carbonated beverages, regular and diet                                    Cranberry, grape and apple juices Sports drinks like Gatorade Lightly seasoned clear broth or consume(fat free) Sugar     BRUSH YOUR TEETH MORNING OF SURGERY AND RINSE YOUR MOUTH OUT, NO CHEWING GUM CANDY OR MINTS.     Take these medicines the morning  of surgery with A SIP OF WATER: Lamotrigine, Benztropine, Loxapine, Inderol, Synthroid, Nexium Bring your  spinal stimulator remote with you                                You may not have any metal on your body including              piercings  Do not wear jewelry, lotions, powders or , deodorant             Men may shave face and neck.   Do not bring valuables to the hospital. [place].  Contacts, dentures or bridgework may not be worn into surgery.  Leave suitcase in the car. After surgery it may be brought to your room.                  Please read over the following fact sheets you were given: _____________________________________________________________________             [facility] - Preparing for Surgery Before surgery, you can play an important role.  Because skin is not sterile, your skin needs to be as free of germs as possible.  You can reduce the number of germs on your skin by washing with CHG (chlorahexidine gluconate) soap before surgery.  CHG is an antiseptic cleaner which kills germs and bonds with the skin to continue killing germs even after washing. Please DO NOT use if you have an allergy to CHG or antibacterial soaps.  If your skin becomes reddened/irritated stop using the CHG and inform your nurse when you arrive at Short Stay. You may shave your face/neck. Please follow these instructions carefully:  1.  Shower with CHG Soap the night before surgery and the  morning of Surgery.  2.  If you choose to wash your hair, wash your hair first as usual with your  normal  shampoo.  3.  After you shampoo, rinse your hair and body thoroughly to remove the  shampoo.                            4.  Use CHG as you would any other liquid soap.  You can apply chg directly  to the skin and wash                       Gently with a scrungie or clean washcloth.  5.  Apply the CHG Soap to your body ONLY FROM THE NECK DOWN.   Do not  use on face/ open                           Wound or open sores. Avoid contact with eyes, ears mouth and genitals (private parts).                       Wash face,  Genitals (private parts) with your normal soap.             6.  Wash thoroughly, paying special attention to the area where your surgery  will be performed.  7.  Thoroughly rinse your body with warm water from the neck down.  8.  DO NOT shower/wash with your normal soap after using and rinsing off  the CHG Soap.  9.  Pat yourself dry with a clean towel.            10.  Wear clean pajamas.            11.  Place clean sheets on your bed the night of your first shower and do not  sleep with pets. Day of Surgery : Do not apply any lotions/deodorants the morning of surgery.  Please wear clean clothes to the hospital/surgery center.  FAILURE TO FOLLOW THESE INSTRUCTIONS MAY RESULT IN THE CANCELLATION OF YOUR SURGERY PATIENT SIGNATURE_________________________________  NURSE SIGNATURE__________________________________  ________________________________________________________________________   Adam Phenix  An incentive spirometer is a tool that can help keep your lungs clear and active. This tool measures how well you are filling your lungs with each breath. Taking long deep breaths may help reverse or decrease the chance of developing breathing (pulmonary) problems (especially infection) following: A long period of time when you are unable to move or be active. BEFORE THE PROCEDURE  If the spirometer includes an indicator to show your best effort, your nurse or respiratory therapist will set it to a desired goal. If possible, sit up straight or lean slightly forward. Try not to slouch. Hold the incentive spirometer in an upright position. INSTRUCTIONS FOR USE  Sit on the edge of your bed if possible, or sit up as far as you can in bed or on a chair. Hold the incentive spirometer in an upright position. Breathe  out normally. Place the mouthpiece in your mouth and seal your lips tightly around it. Breathe in slowly and as deeply as possible, raising the piston or the ball toward the top of the column. Hold your breath for 3-5 seconds or for as long as possible. Allow the piston or ball to fall to the bottom of the column. Remove the mouthpiece from your mouth and breathe out normally. Rest for a few seconds and repeat Steps 1 through 7 at least 10 times every 1-2 hours when you are awake. Take your time and take a few normal breaths between deep breaths. The spirometer may include an indicator to show your best effort. Use the indicator as a goal to work toward during each repetition. After each set of 10 deep breaths, practice coughing to be sure your lungs are clear. If you have an incision (the cut made at the time of surgery), support your incision when coughing by placing a pillow or rolled up towels firmly against it. Once you are able to get out of bed, walk around indoors and cough well. You may stop using the incentive spirometer when instructed by your caregiver.  RISKS AND COMPLICATIONS Take your time so you do not get dizzy or light-headed. If you are in pain, you may need to take or ask for pain medication before doing incentive spirometry. It is harder to take a deep breath if you are having pain. AFTER USE Rest and breathe slowly and easily. It can be helpful to keep track of a log of your progress. Your caregiver can provide you with a simple table to help with this. If you are using the spirometer at home, follow these instructions: [place] IF:  You are having difficultly using the spirometer. You have trouble using the spirometer as often as instructed. Your pain medication is not giving enough relief while using the spirometer. You develop fever of 100.5 F (38.1 C) or higher. SEEK IMMEDIATE MEDICAL CARE IF:  You cough up bloody sputum that had not  been present  before. You develop fever of 102 F (38.9 C) or greater. You develop worsening pain at or near the incision site. MAKE SURE YOU:  Understand these instructions. Will watch your condition. Will get help right away if you are not doing well or get worse. Document Released: 09/29/2006 Document Revised: 08/11/2011 Document Reviewed: 11/30/2006 [facility] Patient Information 2014 [place], [place].   ________________________________________________________________________

## 2021-03-01 ENCOUNTER — Encounter (HOSPITAL_COMMUNITY): Payer: Self-pay

## 2021-03-01 ENCOUNTER — Ambulatory Visit
Admission: RE | Admit: 2021-03-01 | Discharge: 2021-03-01 | Disposition: A | Discharge status: Home / Self Care | Payer: 59 | Source: Ambulatory Visit | Attending: Student | Admitting: Student

## 2021-03-01 ENCOUNTER — Other Ambulatory Visit: Payer: Self-pay

## 2021-03-01 ENCOUNTER — Other Ambulatory Visit: Payer: Self-pay | Admitting: Orthopaedic Surgery

## 2021-03-01 ENCOUNTER — Encounter (HOSPITAL_COMMUNITY)
Admission: RE | Admit: 2021-03-01 | Discharge: 2021-03-01 | Disposition: A | Discharge status: Home / Self Care | Payer: 59 | Source: Ambulatory Visit | Attending: Orthopaedic Surgery | Admitting: Orthopaedic Surgery

## 2021-03-01 DIAGNOSIS — M4316 Spondylolisthesis, lumbar region: Secondary | ICD-10-CM | POA: Diagnosis not present

## 2021-03-01 DIAGNOSIS — M542 Cervicalgia: Secondary | ICD-10-CM

## 2021-03-01 DIAGNOSIS — Z01818 Encounter for other preprocedural examination: Secondary | ICD-10-CM | POA: Insufficient documentation

## 2021-03-01 DIAGNOSIS — M2578 Osteophyte, vertebrae: Secondary | ICD-10-CM | POA: Diagnosis not present

## 2021-03-01 DIAGNOSIS — M5416 Radiculopathy, lumbar region: Secondary | ICD-10-CM

## 2021-03-01 DIAGNOSIS — M4322 Fusion of spine, cervical region: Secondary | ICD-10-CM | POA: Diagnosis not present

## 2021-03-01 DIAGNOSIS — M5126 Other intervertebral disc displacement, lumbar region: Secondary | ICD-10-CM | POA: Diagnosis not present

## 2021-03-01 LAB — CBC WITH DIFFERENTIAL/PLATELET
Abs Immature Granulocytes: 0.09 10*3/uL — ABNORMAL HIGH (ref 0.00–0.07)
Basophils Absolute: 0.1 10*3/uL (ref 0.0–0.1)
Basophils Relative: 1 %
Eosinophils Absolute: 0.4 10*3/uL (ref 0.0–0.5)
Eosinophils Relative: 4 %
HCT: 39.5 % (ref 39.0–52.0)
Hemoglobin: 13.8 g/dL (ref 13.0–17.0)
Immature Granulocytes: 1 %
Lymphocytes Relative: 34 %
Lymphs Abs: 3.4 10*3/uL (ref 0.7–4.0)
MCH: 30.9 pg (ref 26.0–34.0)
MCHC: 34.9 g/dL (ref 30.0–36.0)
MCV: 88.4 fL (ref 80.0–100.0)
Monocytes Absolute: 0.9 10*3/uL (ref 0.1–1.0)
Monocytes Relative: 9 %
Neutro Abs: 5.2 10*3/uL (ref 1.7–7.7)
Neutrophils Relative %: 51 %
Platelets: 369 10*3/uL (ref 150–400)
RBC: 4.47 MIL/uL (ref 4.22–5.81)
RDW: 12.2 % (ref 11.5–15.5)
WBC: 10.1 10*3/uL (ref 4.0–10.5)
nRBC: 0 % (ref 0.0–0.2)

## 2021-03-01 LAB — URINALYSIS, ROUTINE W REFLEX MICROSCOPIC
Bilirubin Urine: NEGATIVE
Glucose, UA: NEGATIVE mg/dL
Hgb urine dipstick: NEGATIVE
Ketones, ur: NEGATIVE mg/dL
Leukocytes,Ua: NEGATIVE
Nitrite: NEGATIVE
Protein, ur: NEGATIVE mg/dL
Specific Gravity, Urine: 1.009 (ref 1.005–1.030)
pH: 5 (ref 5.0–8.0)

## 2021-03-01 LAB — BASIC METABOLIC PANEL
Anion gap: 12 (ref 5–15)
BUN: 17 mg/dL (ref 6–20)
CO2: 24 mmol/L (ref 22–32)
Calcium: 8.9 mg/dL (ref 8.9–10.3)
Chloride: 99 mmol/L (ref 98–111)
Creatinine, Ser: 0.91 mg/dL (ref 0.61–1.24)
GFR, Estimated: >60 mL/min (ref >60)
Glucose, Bld: 88 mg/dL (ref 70–99)
Potassium: 3.7 mmol/L (ref 3.5–5.1)
Sodium: 135 mmol/L (ref 135–145)

## 2021-03-01 LAB — SURGICAL PCR SCREEN
MRSA, PCR: NEGATIVE
Staphylococcus aureus: NEGATIVE

## 2021-03-01 LAB — PROTIME-INR
INR: 0.9 (ref 0.8–1.2)
Prothrombin Time: 12 seconds (ref 11.4–15.2)

## 2021-03-01 LAB — APTT: aPTT: 29 seconds (ref 24–36)

## 2021-03-01 MED ORDER — ONDANSETRON HCL 4 MG/2ML IJ SOLN: 4.0000 mg | Freq: Once | INTRAMUSCULAR | Status: DC | PRN

## 2021-03-01 MED ORDER — IOPAMIDOL (ISOVUE-M 300) INJECTION 61%
10.0000 mL | Freq: Once | INTRAMUSCULAR | Status: AC
  Administered 2021-03-01: 10 mL via INTRATHECAL

## 2021-03-01 MED ORDER — HYDROMORPHONE HCL 1 MG/ML IJ SOLN
0.5000 mg | Freq: Once | INTRAMUSCULAR | Status: AC
Start: 2021-03-01 — End: 2021-03-01
  Administered 2021-03-01: 0.5 mg via INTRAMUSCULAR

## 2021-03-01 MED ORDER — DIAZEPAM 5 MG PO TABS
10.0000 mg | ORAL_TABLET | Freq: Once | ORAL | Status: AC
  Administered 2021-03-01: 10 mg via ORAL

## 2021-03-01 NOTE — Discharge Instructions (Signed)

## 2021-03-01 NOTE — Progress Notes (Signed)
COVID test-03/01/21  PCP - Dr. Glean Hess PA Cardiologist - Dr. D. harding  Chest x-ray - 09/25/20 -epic EKG - chart Stress Test - 2016 ECHO - 2016 Cardiac Cath - 2017-epic Pacemaker/ICD device last checked:  Sleep Study - no CPAP -   Fasting Blood Sugar - NA Checks Blood Sugar _____ times a day  Blood Thinner Instructions:NA Aspirin Instructions: Last Dose:  Anesthesia review: yes  Patient denies shortness of breath, fever, cough and chest pain at PAT appointment Pt has SOB with exertion. He climbs stairs slowly and with a cane. He has tremors and chronic pain.  Patient verbalized understanding of instructions that were given to them at the PAT appointment. Patient was also instructed that they will need to review over the PAT instructions again at home before surgery. Yes  Wife was with him at PAT visit

## 2021-03-01 NOTE — Discharge Instr - Other Orders (Signed)
1115: 7/10 pain noted to back post myelogram procedure, see MAR. Pt has allergy to Demerol.  1138: Relief reported

## 2021-03-02 LAB — SARS CORONAVIRUS 2 (TAT 6-24 HRS): SARS Coronavirus 2: NEGATIVE

## 2021-03-04 ENCOUNTER — Encounter (HOSPITAL_COMMUNITY): Payer: Self-pay | Admitting: Orthopaedic Surgery

## 2021-03-04 MED ORDER — TRANEXAMIC ACID 1000 MG/10ML IV SOLN
2000.0000 mg | INTRAVENOUS | Status: DC
  Filled 2021-03-04: qty 20

## 2021-03-04 NOTE — H&P (Signed)
TOTAL HIP ADMISSION H&P  Patient is admitted for left total hip arthroplasty.  Subjective:  Chief Complaint: left hip pain  HPI: [name], 55 y.o. male, has a history of pain and functional disability in the left hip(s) due to arthritis and patient has failed non-surgical conservative treatments for greater than 12 weeks to include NSAID's and/or analgesics, flexibility and strengthening excercises, supervised PT with diminished ADL's post treatment, use of assistive devices, weight reduction as appropriate, and activity modification.  Onset of symptoms was gradual starting 5 years ago with gradually worsening course since that time.The patient noted no past surgery on the left hip(s).  Patient currently rates pain in the left hip at 10 out of 10 with activity. Patient has night pain, worsening of pain with activity and weight bearing, trendelenberg gait, pain that interfers with activities of daily living, and crepitus. Patient has evidence of subchondral cysts, subchondral sclerosis, periarticular osteophytes, and joint space narrowing by imaging studies. This condition presents safety issues increasing the risk of falls. There is no current active infection.  Patient Active Problem List   Diagnosis Date Noted   Primary localized osteoarthritis of right hip 03/15/2019   Primary osteoarthritis of right hip 03/15/2019   Pseudoarthrosis of cervical spine ([place]) 12/23/2017   Bipolar I disorder, most recent episode depressed, severe with psychotic features ([place]) 10/04/2017   Spinal stenosis of cervical region 09/21/2017   Chronic pain syndrome 10/28/2016   Cannabis use disorder, mild, abuse 10/28/2016   Rhabdomyolysis 08/23/2016   AKI (acute kidney injury) ([place]) 08/23/2016   Transaminitis 08/23/2016   Drug overdose, multiple drugs 08/23/2016   Tachycardia 08/23/2016   Accelerated hypertension 08/23/2016   Bipolar disorder, curr episode mixed, severe, with psychotic features ([place])  12/26/2015   Cocaine use disorder, mild, abuse ([place]) 12/26/2015   History of rhabdomyolysis 12/26/2015   Liver injury 12/26/2015   Hx of renal failure 12/26/2015   Angina, class III ([place]) 08/18/2015   Dyslipidemia, goal LDL below 70 08/18/2015   Family history of premature coronary artery disease 08/18/2015   Tobacco use disorder, continuous 08/18/2015   Tibial plateau fracture 10/02/2013   Multiple falls 10/02/2013   Alteration in self-care ability 10/02/2013   Hypothyroidism, lithium induced 10/02/2013   Fracture of tibia with fibula, left, closed 10/02/2013   Chronic back pain 10/02/2013   Spinal cord stimulator, status-post 10/02/2013   History of suicidal ideation 10/02/2013   Postural imbalance with levoscoliosis, h/o 10/02/2013   Degenerative disc disease, lumbar 09/07/2013   Chest pain 12/25/2012   Leg pain 02/24/2011   Neck pain 02/24/2011   Right hip pain 02/24/2011   IRRITABLE BOWEL SYNDROME 08/09/2008   HYPERLIPIDEMIA 10/28/2006   DISORDER, BIPOLAR NOS 10/28/2006   ANXIETY 10/28/2006   DEPRESSION 10/28/2006   PAIN, CHRONIC NEC 10/28/2006   GERD 10/28/2006   Past Medical History:  Diagnosis Date   Accelerated hypertension 08/23/2016   pt denies this   Anxiety    Arthritis    Bipolar affective disorder ([place])    Chronic back pain    Chronic kidney disease 2017   AKF - due to Rhabdomyolysis   Coronary artery disease    minimal Mid LAD to Dist LAD lesion, 35% stenosed. Prox Cx lesion, 40% stenosed.   DDD (degenerative disc disease)    Depression    DJD (degenerative joint disease)    Elevated liver function tests    Fibromyalgia    GERD (gastroesophageal reflux disease)    Head injury  Head injury, closed, with concussion    x 3  from falls   Hyperlipidemia    Hypothyroidism    IBS (irritable bowel syndrome)    with constapation   Inguinal hernia    left   Insomnia    Myofascial pain syndrome    Osteopenia    Rhabdomyolysis    Shortness of breath  dyspnea    with exertion   Tibial plateau fracture, left    Tremors of nervous system     Past Surgical History:  Procedure Laterality Date   ANTERIOR CERVICAL DECOMP/DISCECTOMY FUSION N/A 09/21/2017   Procedure: ANTERIOR CERVICAL DECOMPRESSION/DISCECTOMY FUSION - CERVICAL THREE-CERVICAL FOUR;  Surgeon: Kary Kos, MD;  Location: [facility];  Service: Neurosurgery;  Laterality: N/A;   ANTERIOR CERVICAL DECOMP/DISCECTOMY FUSION N/A 12/23/2017   Procedure: REMOVAL AND REPLACEMENT OF CERVICAL THREE-FOUR HARDWARE;  Surgeon: Kary Kos, MD;  Location: [facility];  Service: Neurosurgery;  Laterality: N/A;   CARDIAC CATHETERIZATION N/A 08/31/2015   Procedure: Left Heart Cath and Coronary Angiography;  Surgeon: Leonie Man, MD;  Location: [facility] [facility];  Service: Cardiovascular;  Laterality: N/A;   Carotid Dopplers Bilateral 02/20/2015   [facility]: Mild, less than 39% left and right internal carotid artery stenosis. No significant plaque burden   CERVICAL DISC SURGERY     C5-7   COLONOSCOPY     COLONOSCOPY     ESOPHAGOGASTRODUODENOSCOPY     FASCIOTOMY Left 10/20/2013   Procedure: LEFT leg ANTERIOR COMPARTMENT FACSCIOTOMY;  Surgeon: Rozanna Box, MD;  Location: [facility];  Service: Orthopedics;  Laterality: Left;   INGUINAL HERNIA REPAIR Left    Nuclear Stress Test  03/06/2015   [facility]: Normal EKG. Low normal EF (49%) normal regional wall motion. No evidence of ischemia or infarction.   ORIF TIBIA PLATEAU Left 10/20/2013   Procedure: OPEN REDUCTION INTERNAL FIXATION (ORIF) LEFT TIBIAL PLATEAU;  Surgeon: Rozanna Box, MD;  Location: [place];  Service: Orthopedics;  Laterality: Left;   SEPTOPLASTY  2010   SPINAL CORD STIMULATOR BATTERY EXCHANGE N/A 02/08/2016   Procedure: Lumbar spinal cord stimulator implantable pulse generator replacement;  Surgeon: Clydell Hakim, MD;  Location: [facility];  Service: Neurosurgery;  Laterality: N/A;   SPINAL CORD STIMULATOR BATTERY  EXCHANGE N/A 12/23/2017   Procedure: SPINAL CORD STIMULATOR BATTERY EXCHANGE;  Surgeon: Clydell Hakim, MD;  Location: [place];  Service: Neurosurgery;  Laterality: N/A;   SPINAL CORD STIMULATOR IMPLANT     TOTAL HIP ARTHROPLASTY Right 03/15/2019   Procedure: RIGHT TOTAL HIP ARTHROPLASTY ANTERIOR APPROACH;  Surgeon: Melrose Nakayama, MD;  Location: [facility];  Service: Orthopedics;  Laterality: Right;   TRANSTHORACIC ECHOCARDIOGRAM  02/20/2015   [facility]: Mild concentric LVH. EF 55-60%. Normal regional wall motion. Mild to moderate TR with no significant pulmonary hypertension.    Current Facility-Administered Medications  Medication Dose Route Frequency Provider Last Rate Last Admin   [START ON 03/05/2021] tranexamic acid (CYKLOKAPRON) 2,000 mg in sodium chloride 0.9 % 50 mL Topical Application  8,341 mg Topical To OR Melrose Nakayama, MD       Current Outpatient Medications  Medication Sig Dispense Refill Last Dose   benztropine (COGENTIN) 0.5 MG tablet Take 1 tablet (0.5 mg total) by mouth 2 (two) times daily. 60 tablet 2    diclofenac Sodium (VOLTAREN) 1 % GEL Apply 1 application topically daily as needed (pain).      esomeprazole (NEXIUM) 40 MG capsule Take 1 capsule by mouth once  daily 30 capsule 1    lamoTRIgine (LAMICTAL) 100 MG tablet Take 1 tablet by mouth twice daily. 60 tablet 3    levothyroxine (SYNTHROID) 50 MCG tablet Take 1 tablet by mouth daily 30 tablet 1    loxapine (LOXITANE) 50 MG capsule Take one capsule by mouth 2 times daily. 60 capsule 2    Multiple Vitamins-Minerals (MULTIVITAMIN WITH MINERALS) tablet Take 1 tablet by mouth daily.      nicotine polacrilex (COMMIT) 4 MG lozenge Take 4 mg by mouth as needed for smoking cessation.      nitroGLYCERIN (NITROSTAT) 0.4 MG SL tablet Dissole 1 Tablet under the tongue every 5 minutes as needed for chest pain. MAX 3/day. 25 tablet 1    Omega-3 Fatty Acids (FISH OIL) 1000 MG CAPS Take 2,000 mg by mouth 2 (two) times daily.       ondansetron (ZOFRAN) 4 MG tablet Take 4 mg by mouth every 8 (eight) hours as needed for nausea or vomiting.      oxyCODONE (ROXICODONE) 15 MG immediate release tablet Take 1 tablet by mouth four times daily 120 tablet 0    propranolol ER (INDERAL LA) 120 MG 24 hr capsule Take 1 (one) capsule by mouth daily (Patient not taking: Reported on 03/01/2021) 30 capsule 3    traZODone (DESYREL) 50 MG tablet Take 1 tablet (50 mg total) by mouth at bedtime if needed. 30 tablet 0    Vitamin D, Ergocalciferol, (DRISDOL) 50000 units CAPS capsule Take 1 capsule (50,000 Units total) by mouth every Thursday. For calcium supplementation (Patient taking differently: Take 50,000 Units by mouth every Thursday.) 1 capsule 0    benztropine (COGENTIN) 0.5 MG tablet Take 1 tablet by mouth 2 times every day. (Patient not taking: No sig reported) 60 tablet 2 Not Taking   divalproex (DEPAKOTE) 250 MG DR tablet Take 1 tablet (250 mg total) by mouth 3 (three) times daily. (Patient not taking: No sig reported) 90 tablet 3 Not Taking   divalproex (DEPAKOTE) 250 MG DR tablet Take 1 tablet (250 mg total) by mouth 3 (three) times daily for bipolar disorder. (Patient not taking: No sig reported) 45 tablet 0 Not Taking   esomeprazole (NEXIUM) 40 MG capsule Take 1 capsule (40 mg total) by mouth daily. 30 capsule 0    esomeprazole (NEXIUM) 40 MG capsule Take 1 capsule (40 mg total) by mouth daily. (Patient not taking: No sig reported) 30 capsule 1 Not Taking   hydrOXYzine (ATARAX/VISTARIL) 25 MG tablet Take 1 tablet by mouth 3 times every day as needed. (Patient not taking: No sig reported) 90 tablet 3 Not Taking   hydrOXYzine (ATARAX/VISTARIL) 50 MG tablet Take 50 mg by mouth 3 (three) times daily as needed for anxiety. (Patient not taking: No sig reported)   Not Taking   hydrOXYzine (VISTARIL) 25 MG capsule Take 1 capsule (25 mg total) by mouth every 8 (eight) hours as needed for anxiety. (Patient taking differently: Take 25 mg by mouth  3 (three) times daily.) 45 capsule 0    lamoTRIgine (LAMICTAL) 100 MG tablet Take 1 tablet (100 mg total) by mouth 2 (two) times daily. (Patient not taking: No sig reported) 60 tablet 3 Not Taking   lamoTRIgine (LAMICTAL) 100 MG tablet Take 1 tablet (100 mg total) by mouth 2 (two) times daily. (Patient not taking: No sig reported) 60 tablet 0 Not Taking   loxapine (LOXITANE) 50 MG capsule Take 1 capsule (50 mg total) by mouth 2 (two) times daily. (Patient  not taking: No sig reported) 60 capsule 2 Not Taking   loxapine (LOXITANE) 50 MG capsule Take 1 capsule (50 mg total) by mouth every 12 (twelve) hours. (Patient not taking: No sig reported) 60 capsule 0 Not Taking   nicotine (NICODERM CQ - DOSED IN MG/24 HOURS) 14 mg/24hr patch Place 1 patch (14 mg total) onto the skin daily. (may purchase from over the counter): For smoking cessation (Patient not taking: No sig reported) 28 patch 0 Not Taking   nicotine (NICODERM CQ - DOSED IN MG/24 HOURS) 21 mg/24hr patch Place 21 mg onto the skin daily.  (Patient not taking: No sig reported)   Not Taking   propranolol ER (INDERAL LA) 120 MG 24 hr capsule Take 1 capsule (120 mg total) by mouth daily. (Patient not taking: No sig reported) 30 capsule 2 Not Taking   propranolol ER (INDERAL LA) 60 MG 24 hr capsule Take 1 (one) capsule by mouth daily 30 capsule 3 Not Taking   traZODone (DESYREL) 150 MG tablet Take 1 tablet (150 mg total) by mouth daily. (Patient not taking: No sig reported) 30 tablet 3 Not Taking   traZODone (DESYREL) 150 MG tablet Take one tablet by mouth every day. (Patient not taking: No sig reported) 30 tablet 3 Not Taking   Allergies  Allergen Reactions   Zolpidem Tartrate Other (See Comments)    Hallucinations and sleep walks   Lyrica [Pregabalin] Other (See Comments)    Caused extreme depression   Demerol [Meperidine] Itching, Rash and Hives    Social History   Tobacco Use   Smoking status: Every Day    Packs/day: 0.25    Years: 40.00     Pack years: 10.00    Types: Cigarettes   Smokeless tobacco: Never  Substance Use Topics   Alcohol use: No    Alcohol/week: 0.0 standard drinks    Comment: none since 1999- heavy drinker in past    Family History  Problem Relation Age of Onset   Heart disease Mother    Hypertension Mother    Sudden death Mother        Presumably cardiac   Hyperlipidemia Father    Heart attack Father 77       At least 5 MIs. Had CABG.   Heart failure Father    Diabetes Father    Hypertension Father    Kidney disease Father    Diabetes Brother    Lung cancer Maternal Grandmother    Diabetes Paternal Grandmother    Sudden death Paternal Grandmother        Unclear etiology   Diabetes Other    Kidney disease Other    Kidney disease Brother    Hypertension Brother    Sudden death Brother        Thought to be related to heart disease   Throat cancer Brother    Colon cancer Neg Hx    Mental illness Neg Hx      Review of Systems  Musculoskeletal:  Positive for arthralgias.       Left hip  All other systems reviewed and are negative.  Objective:  Physical Exam Constitutional:      Appearance: Normal appearance.  HENT:     Head: Normocephalic and atraumatic.     Mouth/Throat:     Pharynx: Oropharynx is clear.  Cardiovascular:     Rate and Rhythm: Normal rate and regular rhythm.     Pulses: Normal pulses.  Pulmonary:     Effort: Pulmonary  effort is normal.  Abdominal:     Palpations: Abdomen is soft.  Musculoskeletal:     Comments: Examination of the left hip shows  good range of motion with significant pain at end internal range of motion.  Leg lengths are roughly equal.  No real tenderness palpation throughout.  He is neurovascularly intact distally.  Skin:    General: Skin is warm and dry.  Neurological:     General: No focal deficit present.     Mental Status: He is alert and oriented to person, place, and time. Mental status is at baseline.  Psychiatric:        Mood and  Affect: Mood normal.        Behavior: Behavior normal.        Thought Content: Thought content normal.        Judgment: Judgment normal.    Vital signs in last 24 hours:    Labs:   Estimated body mass index is 31.33 kg/m as calculated from the following:   Height as of 03/01/21: 6' (1.829 m).   Weight as of 03/01/21: 104.8 kg.   Imaging Review Plain radiographs demonstrate severe degenerative joint disease of the left hip(s). The bone quality appears to be good for age and reported activity level.      Assessment/Plan:  End stage primary arthritis, left hip(s)  The patient history, physical examination, clinical judgement of the provider and imaging studies are consistent with end stage degenerative joint disease of the left hip(s) and total hip arthroplasty is deemed medically necessary. The treatment options including medical management, injection therapy, arthroscopy and arthroplasty were discussed at length. The risks and benefits of total hip arthroplasty were presented and reviewed. The risks due to aseptic loosening, infection, stiffness, dislocation/subluxation,  thromboembolic complications and other imponderables were discussed.  The patient acknowledged the explanation, agreed to proceed with the plan and consent was signed. Patient is being admitted for inpatient treatment for surgery, pain control, PT, OT, prophylactic antibiotics, VTE prophylaxis, progressive ambulation and ADL's and discharge planning.The patient is planning to be discharged home with home health services

## 2021-03-04 NOTE — Anesthesia Preprocedure Evaluation (Addendum)
Anesthesia Evaluation  Patient identified by MRN, date of birth, ID band Patient awake    Reviewed: Allergy & Precautions, NPO status , Patient's Chart, lab work & pertinent test results  Airway Mallampati: I  TM Distance: >3 FB Neck ROM: Full    Dental  (+) Edentulous Upper, Edentulous Lower   Pulmonary Current Smoker and Patient abstained from smoking.,    breath sounds clear to auscultation       Cardiovascular hypertension, + CAD   Rhythm:Regular Rate:Normal     Neuro/Psych PSYCHIATRIC DISORDERS Anxiety Depression Bipolar Disorder    GI/Hepatic GERD  ,  Endo/Other  Hypothyroidism   Renal/GU CRFRenal disease     Musculoskeletal  (+) Arthritis , Fibromyalgia -  Abdominal Normal abdominal exam  (+)   Peds  Hematology   Anesthesia Other Findings   Reproductive/Obstetrics                            Anesthesia Physical Anesthesia Plan  ASA: 3  Anesthesia Plan: General   Post-op Pain Management:    Induction: Intravenous  PONV Risk Score and Plan: 2 and Ondansetron and Midazolam  Airway Management Planned: Oral ETT  Additional Equipment: None  Intra-op Plan:   Post-operative Plan: Extubation in OR  Informed Consent: I have reviewed the patients History and Physical, chart, labs and discussed the procedure including the risks, benefits and alternatives for the proposed anesthesia with the patient or authorized representative who has indicated his/her understanding and acceptance.     Dental advisory given  Plan Discussed with: CRNA  Anesthesia Plan Comments: (Pt declined spinal due to spinal cord stimulator.  Lab Results      Component                Value               Date                      WBC                      10.1                03/01/2021                HGB                      13.8                03/01/2021                HCT                      39.5                 03/01/2021                MCV                      88.4                03/01/2021                PLT                      369  03/01/2021           )       Anesthesia Quick Evaluation

## 2021-03-05 ENCOUNTER — Encounter (HOSPITAL_COMMUNITY)
Admission: RE | Disposition: A | Discharge status: Home / Self Care | Payer: Self-pay | Source: Home / Self Care | Attending: Orthopaedic Surgery

## 2021-03-05 ENCOUNTER — Ambulatory Visit (HOSPITAL_COMMUNITY): Payer: 59 | Admitting: Anesthesiology

## 2021-03-05 ENCOUNTER — Observation Stay (HOSPITAL_COMMUNITY)
Admission: RE | Admit: 2021-03-05 | Discharge: 2021-03-06 | Disposition: A | Discharge status: Home / Self Care | Payer: 59 | Attending: Orthopaedic Surgery | Admitting: Orthopaedic Surgery

## 2021-03-05 ENCOUNTER — Other Ambulatory Visit: Payer: Self-pay

## 2021-03-05 ENCOUNTER — Encounter (HOSPITAL_COMMUNITY): Payer: Self-pay | Admitting: Orthopaedic Surgery

## 2021-03-05 ENCOUNTER — Ambulatory Visit (HOSPITAL_COMMUNITY): Payer: 59

## 2021-03-05 DIAGNOSIS — M1612 Unilateral primary osteoarthritis, left hip: Principal | ICD-10-CM | POA: Insufficient documentation

## 2021-03-05 DIAGNOSIS — I2510 Atherosclerotic heart disease of native coronary artery without angina pectoris: Secondary | ICD-10-CM | POA: Insufficient documentation

## 2021-03-05 DIAGNOSIS — E785 Hyperlipidemia, unspecified: Secondary | ICD-10-CM | POA: Diagnosis not present

## 2021-03-05 DIAGNOSIS — E039 Hypothyroidism, unspecified: Secondary | ICD-10-CM | POA: Insufficient documentation

## 2021-03-05 DIAGNOSIS — I129 Hypertensive chronic kidney disease with stage 1 through stage 4 chronic kidney disease, or unspecified chronic kidney disease: Secondary | ICD-10-CM | POA: Insufficient documentation

## 2021-03-05 DIAGNOSIS — Z79899 Other long term (current) drug therapy: Secondary | ICD-10-CM | POA: Diagnosis not present

## 2021-03-05 DIAGNOSIS — N189 Chronic kidney disease, unspecified: Secondary | ICD-10-CM | POA: Diagnosis not present

## 2021-03-05 DIAGNOSIS — F418 Other specified anxiety disorders: Secondary | ICD-10-CM | POA: Diagnosis not present

## 2021-03-05 DIAGNOSIS — Z419 Encounter for procedure for purposes other than remedying health state, unspecified: Secondary | ICD-10-CM

## 2021-03-05 DIAGNOSIS — M87852 Other osteonecrosis, left femur: Secondary | ICD-10-CM | POA: Diagnosis not present

## 2021-03-05 DIAGNOSIS — F17210 Nicotine dependence, cigarettes, uncomplicated: Secondary | ICD-10-CM | POA: Insufficient documentation

## 2021-03-05 DIAGNOSIS — Z471 Aftercare following joint replacement surgery: Secondary | ICD-10-CM | POA: Diagnosis not present

## 2021-03-05 DIAGNOSIS — Z96642 Presence of left artificial hip joint: Secondary | ICD-10-CM | POA: Diagnosis not present

## 2021-03-05 HISTORY — PX: TOTAL HIP ARTHROPLASTY: SHX124

## 2021-03-05 HISTORY — DX: Unilateral primary osteoarthritis, left hip: M16.12

## 2021-03-05 LAB — TYPE AND SCREEN
ABO/RH(D): O POS
Antibody Screen: NEGATIVE

## 2021-03-05 SURGERY — ARTHROPLASTY, HIP, TOTAL, ANTERIOR APPROACH
Anesthesia: General | Site: Hip | Laterality: Left

## 2021-03-05 MED ORDER — ACETAMINOPHEN 10 MG/ML IV SOLN: 1000.0000 mg | Freq: Once | INTRAVENOUS | Status: DC | PRN

## 2021-03-05 MED ORDER — LACTATED RINGERS IV SOLN: INTRAVENOUS | Status: DC

## 2021-03-05 MED ORDER — NICOTINE 21 MG/24HR TD PT24
21.0000 mg | MEDICATED_PATCH | Freq: Every day | TRANSDERMAL | Status: DC
  Administered 2021-03-05 – 2021-03-06 (×2): 21 mg via TRANSDERMAL
  Filled 2021-03-05 (×2): qty 1

## 2021-03-05 MED ORDER — LIDOCAINE HCL (PF) 2 % IJ SOLN
INTRAMUSCULAR | Status: AC
  Filled 2021-03-05: qty 5

## 2021-03-05 MED ORDER — KETOROLAC TROMETHAMINE 15 MG/ML IJ SOLN
15.0000 mg | Freq: Four times a day (QID) | INTRAMUSCULAR | Status: AC
  Administered 2021-03-05 – 2021-03-06 (×4): 15 mg via INTRAVENOUS
  Filled 2021-03-05 (×3): qty 1

## 2021-03-05 MED ORDER — ORAL CARE MOUTH RINSE: 15.0000 mL | Freq: Once | OROMUCOSAL | Status: AC

## 2021-03-05 MED ORDER — ONDANSETRON HCL 4 MG/2ML IJ SOLN
INTRAMUSCULAR | Status: AC
  Filled 2021-03-05: qty 4

## 2021-03-05 MED ORDER — HYDROMORPHONE HCL 1 MG/ML IJ SOLN
INTRAMUSCULAR | Status: AC
  Administered 2021-03-05: 0.5 mg via INTRAVENOUS
  Filled 2021-03-05: qty 1

## 2021-03-05 MED ORDER — KETOROLAC TROMETHAMINE 15 MG/ML IJ SOLN
INTRAMUSCULAR | Status: AC
  Filled 2021-03-05: qty 1

## 2021-03-05 MED ORDER — HYDROMORPHONE HCL 1 MG/ML IJ SOLN
INTRAMUSCULAR | Status: AC
  Filled 2021-03-05: qty 1

## 2021-03-05 MED ORDER — TRANEXAMIC ACID-NACL 1000-0.7 MG/100ML-% IV SOLN
1000.0000 mg | INTRAVENOUS | Status: AC
  Administered 2021-03-05: 1000 mg via INTRAVENOUS
  Filled 2021-03-05: qty 100

## 2021-03-05 MED ORDER — ASPIRIN 81 MG PO CHEW
81.0000 mg | CHEWABLE_TABLET | Freq: Two times a day (BID) | ORAL | Status: DC
  Administered 2021-03-06: 81 mg via ORAL
  Filled 2021-03-05: qty 1

## 2021-03-05 MED ORDER — ACETAMINOPHEN 325 MG PO TABS: 325.0000 mg | ORAL_TABLET | Freq: Four times a day (QID) | ORAL | Status: DC | PRN

## 2021-03-05 MED ORDER — MIDAZOLAM HCL 5 MG/5ML IJ SOLN
INTRAMUSCULAR | Status: DC | PRN
  Administered 2021-03-05: 2 mg via INTRAVENOUS

## 2021-03-05 MED ORDER — METOCLOPRAMIDE HCL 5 MG/ML IJ SOLN: 5.0000 mg | Freq: Three times a day (TID) | INTRAMUSCULAR | Status: DC | PRN

## 2021-03-05 MED ORDER — ALUM & MAG HYDROXIDE-SIMETH 200-200-20 MG/5ML PO SUSP: 30.0000 mL | ORAL | Status: DC | PRN

## 2021-03-05 MED ORDER — BUPIVACAINE LIPOSOME 1.3 % IJ SUSP
INTRAMUSCULAR | Status: AC
  Filled 2021-03-05: qty 20

## 2021-03-05 MED ORDER — PROPOFOL 1000 MG/100ML IV EMUL
INTRAVENOUS | Status: AC
  Filled 2021-03-05: qty 100

## 2021-03-05 MED ORDER — ACETAMINOPHEN 500 MG PO TABS
ORAL_TABLET | ORAL | Status: AC
  Filled 2021-03-05: qty 2

## 2021-03-05 MED ORDER — DOCUSATE SODIUM 100 MG PO CAPS
100.0000 mg | ORAL_CAPSULE | Freq: Two times a day (BID) | ORAL | Status: DC
  Administered 2021-03-05 – 2021-03-06 (×3): 100 mg via ORAL
  Filled 2021-03-05 (×3): qty 1

## 2021-03-05 MED ORDER — PROMETHAZINE HCL 25 MG/ML IJ SOLN: 6.2500 mg | INTRAMUSCULAR | Status: DC | PRN

## 2021-03-05 MED ORDER — METHOCARBAMOL 500 MG PO TABS
500.0000 mg | ORAL_TABLET | Freq: Four times a day (QID) | ORAL | Status: DC | PRN
  Administered 2021-03-05 – 2021-03-06 (×4): 500 mg via ORAL
  Filled 2021-03-05 (×3): qty 1

## 2021-03-05 MED ORDER — CEFAZOLIN SODIUM-DEXTROSE 2-4 GM/100ML-% IV SOLN
2.0000 g | INTRAVENOUS | Status: AC
  Administered 2021-03-05: 2 g via INTRAVENOUS
  Filled 2021-03-05: qty 100

## 2021-03-05 MED ORDER — ROCURONIUM BROMIDE 10 MG/ML (PF) SYRINGE
PREFILLED_SYRINGE | INTRAVENOUS | Status: DC | PRN
  Administered 2021-03-05: 20 mg via INTRAVENOUS
  Administered 2021-03-05 (×2): 10 mg via INTRAVENOUS
  Administered 2021-03-05: 60 mg via INTRAVENOUS

## 2021-03-05 MED ORDER — BUPIVACAINE LIPOSOME 1.3 % IJ SUSP: 10.0000 mL | Freq: Once | INTRAMUSCULAR | Status: DC

## 2021-03-05 MED ORDER — LAMOTRIGINE 100 MG PO TABS
100.0000 mg | ORAL_TABLET | Freq: Two times a day (BID) | ORAL | Status: DC
  Administered 2021-03-05 – 2021-03-06 (×2): 100 mg via ORAL
  Filled 2021-03-05 (×2): qty 1

## 2021-03-05 MED ORDER — METHOCARBAMOL 500 MG PO TABS
ORAL_TABLET | ORAL | Status: AC
  Filled 2021-03-05: qty 1

## 2021-03-05 MED ORDER — LIDOCAINE 2% (20 MG/ML) 5 ML SYRINGE
INTRAMUSCULAR | Status: DC | PRN
  Administered 2021-03-05: 100 mg via INTRAVENOUS

## 2021-03-05 MED ORDER — PANTOPRAZOLE SODIUM 40 MG PO TBEC
80.0000 mg | DELAYED_RELEASE_TABLET | Freq: Every day | ORAL | Status: DC
  Administered 2021-03-06: 80 mg via ORAL
  Filled 2021-03-05: qty 2

## 2021-03-05 MED ORDER — OXYCODONE HCL 5 MG PO TABS
5.0000 mg | ORAL_TABLET | ORAL | Status: DC | PRN
  Administered 2021-03-05 – 2021-03-06 (×4): 10 mg via ORAL
  Filled 2021-03-05 (×3): qty 2

## 2021-03-05 MED ORDER — TRANEXAMIC ACID 1000 MG/10ML IV SOLN
INTRAVENOUS | Status: DC | PRN
  Administered 2021-03-05: 2000 mg via TOPICAL

## 2021-03-05 MED ORDER — CHLORHEXIDINE GLUCONATE 0.12 % MT SOLN
15.0000 mL | Freq: Once | OROMUCOSAL | Status: AC
  Administered 2021-03-05: 15 mL via OROMUCOSAL

## 2021-03-05 MED ORDER — LEVOTHYROXINE SODIUM 50 MCG PO TABS
50.0000 ug | ORAL_TABLET | Freq: Every day | ORAL | Status: DC
  Administered 2021-03-06: 50 ug via ORAL
  Filled 2021-03-05: qty 1

## 2021-03-05 MED ORDER — MIDAZOLAM HCL 2 MG/2ML IJ SOLN
INTRAMUSCULAR | Status: AC
  Filled 2021-03-05: qty 2

## 2021-03-05 MED ORDER — TRANEXAMIC ACID-NACL 1000-0.7 MG/100ML-% IV SOLN: 1000.0000 mg | Freq: Once | INTRAVENOUS | Status: AC

## 2021-03-05 MED ORDER — BUPIVACAINE-EPINEPHRINE (PF) 0.25% -1:200000 IJ SOLN
INTRAMUSCULAR | Status: DC | PRN
  Administered 2021-03-05: 30 mL

## 2021-03-05 MED ORDER — FENTANYL CITRATE (PF) 100 MCG/2ML IJ SOLN
INTRAMUSCULAR | Status: AC
  Filled 2021-03-05: qty 2

## 2021-03-05 MED ORDER — ROCURONIUM BROMIDE 10 MG/ML (PF) SYRINGE
PREFILLED_SYRINGE | INTRAVENOUS | Status: AC
  Filled 2021-03-05: qty 10

## 2021-03-05 MED ORDER — BENZTROPINE MESYLATE 0.5 MG PO TABS
0.5000 mg | ORAL_TABLET | Freq: Two times a day (BID) | ORAL | Status: DC
  Administered 2021-03-05 – 2021-03-06 (×2): 0.5 mg via ORAL
  Filled 2021-03-05 (×2): qty 1

## 2021-03-05 MED ORDER — DEXAMETHASONE SODIUM PHOSPHATE 10 MG/ML IJ SOLN
INTRAMUSCULAR | Status: DC | PRN
  Administered 2021-03-05: 10 mg via INTRAVENOUS

## 2021-03-05 MED ORDER — LOXAPINE SUCCINATE 25 MG PO CAPS
50.0000 mg | ORAL_CAPSULE | Freq: Two times a day (BID) | ORAL | Status: DC
  Administered 2021-03-05 – 2021-03-06 (×2): 50 mg via ORAL
  Filled 2021-03-05 (×2): qty 2
  Filled 2021-03-05: qty 10

## 2021-03-05 MED ORDER — METHOCARBAMOL 500 MG IVPB - SIMPLE MED
500.0000 mg | Freq: Four times a day (QID) | INTRAVENOUS | Status: DC | PRN
  Filled 2021-03-05: qty 50

## 2021-03-05 MED ORDER — DEXAMETHASONE SODIUM PHOSPHATE 10 MG/ML IJ SOLN
INTRAMUSCULAR | Status: AC
  Filled 2021-03-05: qty 2

## 2021-03-05 MED ORDER — PROPRANOLOL HCL ER 60 MG PO CP24
60.0000 mg | ORAL_CAPSULE | Freq: Every day | ORAL | Status: DC
  Administered 2021-03-06: 60 mg via ORAL
  Filled 2021-03-05: qty 1

## 2021-03-05 MED ORDER — MENTHOL 3 MG MT LOZG: 1.0000 | LOZENGE | OROMUCOSAL | Status: DC | PRN

## 2021-03-05 MED ORDER — ACETAMINOPHEN 500 MG PO TABS
1000.0000 mg | ORAL_TABLET | Freq: Four times a day (QID) | ORAL | Status: AC
  Administered 2021-03-05 – 2021-03-06 (×4): 1000 mg via ORAL
  Filled 2021-03-05 (×3): qty 2

## 2021-03-05 MED ORDER — PROPOFOL 10 MG/ML IV BOLUS
INTRAVENOUS | Status: DC | PRN
  Administered 2021-03-05: 140 mg via INTRAVENOUS

## 2021-03-05 MED ORDER — ACETAMINOPHEN 160 MG/5ML PO SOLN: 325.0000 mg | Freq: Once | ORAL | Status: DC | PRN

## 2021-03-05 MED ORDER — 0.9 % SODIUM CHLORIDE (POUR BTL) OPTIME
TOPICAL | Status: DC | PRN
  Administered 2021-03-05: 1000 mL

## 2021-03-05 MED ORDER — HYDROMORPHONE HCL 1 MG/ML IJ SOLN
0.2500 mg | INTRAMUSCULAR | Status: DC | PRN
  Administered 2021-03-05 (×2): 0.5 mg via INTRAVENOUS

## 2021-03-05 MED ORDER — ACETAMINOPHEN 325 MG PO TABS: 325.0000 mg | ORAL_TABLET | Freq: Once | ORAL | Status: DC | PRN

## 2021-03-05 MED ORDER — HYDROMORPHONE HCL 1 MG/ML IJ SOLN
0.5000 mg | INTRAMUSCULAR | Status: DC | PRN
  Administered 2021-03-05 – 2021-03-06 (×5): 1 mg via INTRAVENOUS
  Filled 2021-03-05 (×4): qty 1

## 2021-03-05 MED ORDER — PANTOPRAZOLE SODIUM 40 MG PO TBEC: 80.0000 mg | DELAYED_RELEASE_TABLET | Freq: Every day | ORAL | Status: DC

## 2021-03-05 MED ORDER — PHENYLEPHRINE HCL-NACL 20-0.9 MG/250ML-% IV SOLN
INTRAVENOUS | Status: AC
  Filled 2021-03-05: qty 500

## 2021-03-05 MED ORDER — POVIDONE-IODINE 10 % EX SWAB: 2.0000 "application " | Freq: Once | CUTANEOUS | Status: DC

## 2021-03-05 MED ORDER — OXYCODONE HCL 5 MG PO TABS
15.0000 mg | ORAL_TABLET | Freq: Four times a day (QID) | ORAL | Status: DC
  Administered 2021-03-05 – 2021-03-06 (×4): 15 mg via ORAL
  Filled 2021-03-05 (×4): qty 3

## 2021-03-05 MED ORDER — FENTANYL CITRATE (PF) 100 MCG/2ML IJ SOLN
INTRAMUSCULAR | Status: DC | PRN
  Administered 2021-03-05 (×6): 50 ug via INTRAVENOUS

## 2021-03-05 MED ORDER — ONDANSETRON HCL 4 MG/2ML IJ SOLN
INTRAMUSCULAR | Status: DC | PRN
  Administered 2021-03-05: 4 mg via INTRAVENOUS

## 2021-03-05 MED ORDER — BISACODYL 5 MG PO TBEC: 5.0000 mg | DELAYED_RELEASE_TABLET | Freq: Every day | ORAL | Status: DC | PRN

## 2021-03-05 MED ORDER — NITROGLYCERIN 0.4 MG SL SUBL: 0.4000 mg | SUBLINGUAL_TABLET | SUBLINGUAL | Status: DC | PRN

## 2021-03-05 MED ORDER — PHENYLEPHRINE 40 MCG/ML (10ML) SYRINGE FOR IV PUSH (FOR BLOOD PRESSURE SUPPORT)
PREFILLED_SYRINGE | INTRAVENOUS | Status: AC
  Filled 2021-03-05: qty 10

## 2021-03-05 MED ORDER — METOCLOPRAMIDE HCL 5 MG PO TABS
5.0000 mg | ORAL_TABLET | Freq: Three times a day (TID) | ORAL | Status: DC | PRN
  Filled 2021-03-05: qty 2

## 2021-03-05 MED ORDER — CEFAZOLIN SODIUM-DEXTROSE 2-4 GM/100ML-% IV SOLN
2.0000 g | Freq: Four times a day (QID) | INTRAVENOUS | Status: AC
  Administered 2021-03-05 (×2): 2 g via INTRAVENOUS
  Filled 2021-03-05 (×2): qty 100

## 2021-03-05 MED ORDER — PHENYLEPHRINE 40 MCG/ML (10ML) SYRINGE FOR IV PUSH (FOR BLOOD PRESSURE SUPPORT)
PREFILLED_SYRINGE | INTRAVENOUS | Status: DC | PRN
  Administered 2021-03-05: 80 ug via INTRAVENOUS

## 2021-03-05 MED ORDER — HYDROXYZINE HCL 25 MG PO TABS
25.0000 mg | ORAL_TABLET | Freq: Three times a day (TID) | ORAL | Status: DC
  Administered 2021-03-05 – 2021-03-06 (×3): 25 mg via ORAL
  Filled 2021-03-05 (×3): qty 1

## 2021-03-05 MED ORDER — BUPIVACAINE LIPOSOME 1.3 % IJ SUSP
INTRAMUSCULAR | Status: DC | PRN
  Administered 2021-03-05: 20 mL

## 2021-03-05 MED ORDER — SUGAMMADEX SODIUM 200 MG/2ML IV SOLN
INTRAVENOUS | Status: DC | PRN
  Administered 2021-03-05: 200 mg via INTRAVENOUS

## 2021-03-05 MED ORDER — TRANEXAMIC ACID-NACL 1000-0.7 MG/100ML-% IV SOLN
INTRAVENOUS | Status: AC
  Administered 2021-03-05: 1000 mg
  Filled 2021-03-05: qty 100

## 2021-03-05 MED ORDER — BUPIVACAINE-EPINEPHRINE (PF) 0.25% -1:200000 IJ SOLN
INTRAMUSCULAR | Status: AC
  Filled 2021-03-05: qty 30

## 2021-03-05 MED ORDER — LIDOCAINE HCL (PF) 2 % IJ SOLN
INTRAMUSCULAR | Status: AC
  Filled 2021-03-05: qty 10

## 2021-03-05 MED ORDER — ONDANSETRON HCL 4 MG PO TABS
4.0000 mg | ORAL_TABLET | Freq: Four times a day (QID) | ORAL | Status: DC | PRN
  Filled 2021-03-05: qty 1

## 2021-03-05 MED ORDER — DIPHENHYDRAMINE HCL 12.5 MG/5ML PO ELIX: 12.5000 mg | ORAL_SOLUTION | ORAL | Status: DC | PRN

## 2021-03-05 MED ORDER — OXYCODONE HCL 5 MG PO TABS
ORAL_TABLET | ORAL | Status: AC
  Filled 2021-03-05: qty 2

## 2021-03-05 MED ORDER — AMISULPRIDE (ANTIEMETIC) 5 MG/2ML IV SOLN: 10.0000 mg | Freq: Once | INTRAVENOUS | Status: DC | PRN

## 2021-03-05 MED ORDER — TRAZODONE HCL 50 MG PO TABS: 50.0000 mg | ORAL_TABLET | Freq: Every evening | ORAL | Status: DC | PRN

## 2021-03-05 MED ORDER — PROPOFOL 10 MG/ML IV BOLUS
INTRAVENOUS | Status: AC
  Filled 2021-03-05: qty 20

## 2021-03-05 MED ORDER — PHENOL 1.4 % MT LIQD: 1.0000 | OROMUCOSAL | Status: DC | PRN

## 2021-03-05 MED ORDER — STERILE WATER FOR IRRIGATION IR SOLN
Status: DC | PRN
  Administered 2021-03-05: 2000 mL

## 2021-03-05 MED ORDER — ONDANSETRON HCL 4 MG/2ML IJ SOLN: 4.0000 mg | Freq: Four times a day (QID) | INTRAMUSCULAR | Status: DC | PRN

## 2021-03-05 SURGICAL SUPPLY — 46 items
BAG COUNTER SPONGE SURGICOUNT (BAG) IMPLANT
BAG DECANTER FOR FLEXI CONT (MISCELLANEOUS) ×2 IMPLANT
BLADE SAW SGTL 18X1.27X75 (BLADE) ×2 IMPLANT
BOOTIES KNEE HIGH SLOAN (MISCELLANEOUS) ×2 IMPLANT
CELLS DAT CNTRL 66122 CELL SVR (MISCELLANEOUS) ×1 IMPLANT
COVER PERINEAL POST (MISCELLANEOUS) ×2 IMPLANT
COVER SURGICAL LIGHT HANDLE (MISCELLANEOUS) ×2 IMPLANT
CUP PINN GRIPTON 54 100 (Cup) ×2 IMPLANT
DECANTER SPIKE VIAL GLASS SM (MISCELLANEOUS) ×2 IMPLANT
DRAPE FOOT SWITCH (DRAPES) ×2 IMPLANT
DRAPE IMP U-DRAPE 54X76 (DRAPES) ×2 IMPLANT
DRAPE ORTHO SPLIT 77X108 STRL (DRAPES)
DRAPE STERI IOBAN 125X83 (DRAPES) ×2 IMPLANT
DRAPE SURG ORHT 6 SPLT 77X108 (DRAPES) IMPLANT
DRAPE U-SHAPE 47X51 STRL (DRAPES) ×4 IMPLANT
DRESSING AQUACEL AG SP 3.5X6 (GAUZE/BANDAGES/DRESSINGS) ×1 IMPLANT
DRSG AQUACEL AG ADV 3.5X 6 (GAUZE/BANDAGES/DRESSINGS) ×2 IMPLANT
DRSG AQUACEL AG SP 3.5X6 (GAUZE/BANDAGES/DRESSINGS) ×2
DURAPREP 26ML APPLICATOR (WOUND CARE) ×2 IMPLANT
ELECT BLADE TIP CTD 4 INCH (ELECTRODE) ×2 IMPLANT
ELECT REM PT RETURN 15FT ADLT (MISCELLANEOUS) ×2 IMPLANT
ELIMINATOR HOLE APEX DEPUY (Hips) ×2 IMPLANT
GLOVE SRG 8 PF TXTR STRL LF DI (GLOVE) ×2 IMPLANT
GLOVE SURG ENC MOIS LTX SZ8 (GLOVE) ×4 IMPLANT
GLOVE SURG UNDER POLY LF SZ8 (GLOVE) ×4
GOWN STRL REUS W/TWL XL LVL3 (GOWN DISPOSABLE) ×4 IMPLANT
HEAD CERAMIC DELTA 36 PLUS 1.5 (Hips) ×2 IMPLANT
HOLDER FOLEY CATH W/STRAP (MISCELLANEOUS) ×2 IMPLANT
KIT TURNOVER KIT A (KITS) ×2 IMPLANT
LINER NEUTRAL 36ID 54OD (Liner) ×2 IMPLANT
MANIFOLD NEPTUNE II (INSTRUMENTS) ×2 IMPLANT
NEEDLE HYPO 22GX1.5 SAFETY (NEEDLE) ×2 IMPLANT
NS IRRIG 1000ML POUR BTL (IV SOLUTION) ×2 IMPLANT
PACK ANTERIOR HIP CUSTOM (KITS) ×2 IMPLANT
PENCIL SMOKE EVACUATOR (MISCELLANEOUS) IMPLANT
PROTECTOR NERVE ULNAR (MISCELLANEOUS) ×2 IMPLANT
RTRCTR WOUND ALEXIS 18CM MED (MISCELLANEOUS) ×2
STEM FEMORAL SZ 6MM STD ACTIS (Stem) ×2 IMPLANT
SUT ETHIBOND NAB CT1 #1 30IN (SUTURE) ×4 IMPLANT
SUT VIC AB 1 CT1 36 (SUTURE) ×2 IMPLANT
SUT VIC AB 2-0 CT1 27 (SUTURE) ×2
SUT VIC AB 2-0 CT1 TAPERPNT 27 (SUTURE) ×1 IMPLANT
SUT VICRYL AB 3-0 FS1 BRD 27IN (SUTURE) ×2 IMPLANT
SUT VLOC 180 0 24IN GS25 (SUTURE) ×2 IMPLANT
SYR 50ML LL SCALE MARK (SYRINGE) ×2 IMPLANT
TRAY FOLEY MTR SLVR 16FR STAT (SET/KITS/TRAYS/PACK) ×2 IMPLANT

## 2021-03-05 NOTE — Evaluation (Signed)
Physical Therapy Evaluation Patient Details Name: [name] MRN: [id number] DOB: July 30, 1965 Today's Date: 03/05/2021  History of Present Illness  Pt is 55 yo male s/p L anterior THA on 03/05/21.  Pt with hx including OA, bipolar, spinal stenosis, spinal cord stimulator, HLD, head injury, and R THA  Clinical Impression  Pt is s/p THA resulting in the deficits listed below (see PT Problem List). At baseline, pt ambulated with cane and had min A for lower body dressing.  He lives alone but will have assist from aide and ex-wife at d/c.  Today, pt tolerated therapy very well.  He transferred with min g- min A level and ambulated 180' with RW.  Pt with good pain control (had received dilaudid prior).  Expected to progress well.  Pt will benefit from skilled PT to increase their independence and safety with mobility to allow discharge to the venue listed below.         Recommendations for follow up therapy are one component of a multi-disciplinary discharge planning process, led by the attending physician.  Recommendations may be updated based on patient status, additional functional criteria and insurance authorization.  Follow Up Recommendations Follow surgeon's recommendation for DC plan and follow-up therapies;Supervision for mobility/OOB    Equipment Recommendations  None recommended by PT    Recommendations for Other Services       Precautions / Restrictions Precautions Precautions: Fall Restrictions Weight Bearing Restrictions: No Other Position/Activity Restrictions: WBAT      Mobility  Bed Mobility Overal bed mobility: Needs Assistance Bed Mobility: Supine to Sit     Supine to sit: Min assist     General bed mobility comments: min A for L LE    Transfers Overall transfer level: Needs assistance Equipment used: Rolling walker (2 wheeled) Transfers: Sit to/from Stand Sit to Stand: Min guard         General transfer comment: Min cues for L LE  management  Ambulation/Gait Ambulation/Gait assistance: Min guard Gait Distance (Feet): 180 Feet Assistive device: Rolling walker (2 wheeled) Gait Pattern/deviations: Step-through pattern;Decreased stride length Gait velocity: decreased   General Gait Details: Only mild decrease in weight shift to left; min cues to look up  Stairs            Wheelchair Mobility    Modified Rankin (Stroke Patients Only)       Balance Overall balance assessment: Needs assistance Sitting-balance support: No upper extremity supported Sitting balance-Leahy Scale: Good     Standing balance support: Bilateral upper extremity supported;No upper extremity supported Standing balance-Leahy Scale: Fair Standing balance comment: RW to ambulate but could static stand without AD                             Pertinent Vitals/Pain Pain Assessment: 0-10 Pain Score: 3  Pain Location: L hip Pain Descriptors / Indicators: Discomfort;Sore Pain Intervention(s): Limited activity within patient's tolerance;Monitored during session;Repositioned    Home Living Family/patient expects to be discharged to:: Private residence Living Arrangements: Alone Available Help at Discharge: Personal care attendant (~3 hr /day 7 days a week; ex-wife can assist other times) Type of Home: Apartment Home Access: Elevator (5th floor)     Home Layout: One level Home Equipment: Tub bench;Bedside commode;Walker - 2 wheels;Cane - single point      Prior Function Level of Independence: Needs assistance   Gait / Transfers Assistance Needed: Pt ambulated with cane for balance; hx of falls; Could  ambulate in community  ADL's / Homemaking Assistance Needed: Pt independent with ADLs except has assist with lower body dressing; aide assist with IADLs        Hand Dominance        Extremity/Trunk Assessment   Upper Extremity Assessment Upper Extremity Assessment: Overall WFL for tasks assessed    Lower  Extremity Assessment Lower Extremity Assessment: LLE deficits/detail;RLE deficits/detail RLE Deficits / Details: WFL LLE Deficits / Details: Expected post op changes; ROM: WFL; MMT: ankle 5/5, knee 3/5, hip 2/5    Cervical / Trunk Assessment Cervical / Trunk Assessment: Normal  Communication   Communication: No difficulties  Cognition Arousal/Alertness: Awake/alert Behavior During Therapy: WFL for tasks assessed/performed Overall Cognitive Status: Within Functional Limits for tasks assessed                                        General Comments General comments (skin integrity, edema, etc.): VSS; Educated on PT role, POC and encouraged ankle pumps and quad sets tonight    Exercises Total Joint Exercises Ankle Circles/Pumps: AROM;Both;Seated;5 reps Quad Sets: AROM;Seated;Both;5 reps   Assessment/Plan    PT Assessment Patient needs continued PT services  PT Problem List Decreased strength;Decreased mobility;Decreased range of motion;Decreased activity tolerance;Decreased balance;Decreased knowledge of use of DME;Pain       PT Treatment Interventions DME instruction;Therapeutic activities;Modalities;Gait training;Therapeutic exercise;Patient/family education;Balance training;Functional mobility training;Stair training    PT Goals (Current goals can be found in the Care Plan section)  Acute Rehab PT Goals Patient Stated Goal: return home PT Goal Formulation: With patient/family Time For Goal Achievement: 03/19/21 Potential to Achieve Goals: Good    Frequency 7X/week   Barriers to discharge        Co-evaluation               AM-PAC PT "6 Clicks" Mobility  Outcome Measure Help needed turning from your back to your side while in a flat bed without using bedrails?: None Help needed moving from lying on your back to sitting on the side of a flat bed without using bedrails?: A Little Help needed moving to and from a bed to a chair (including a  wheelchair)?: A Little Help needed standing up from a chair using your arms (e.g., wheelchair or bedside chair)?: A Little Help needed to walk in hospital room?: A Little Help needed climbing 3-5 steps with a railing? : A Little 6 Click Score: 19    End of Session Equipment Utilized During Treatment: Gait belt Activity Tolerance: Patient tolerated treatment well Patient left: with chair alarm set;in chair;with family/visitor present;with call bell/phone within reach Nurse Communication: Mobility status PT Visit Diagnosis: Other abnormalities of gait and mobility (R26.89);Muscle weakness (generalized) (M62.81)    Time: 7017-7939 PT Time Calculation (min) (ACUTE ONLY): 21 min   Charges:   PT Evaluation $PT Eval Low Complexity: 1 Low          Jannett Schmall, PT Acute Rehab Services Pager [phone] [facility] [phone]   Karlton Lemon 03/05/2021, 4:19 PM

## 2021-03-05 NOTE — Op Note (Signed)
PRE-OP DIAGNOSIS:  LEFT HIP DEGENERATIVE JOINT DISEASE POST-OP DIAGNOSIS: same PROCEDURE:  LEFT TOTAL HIP ARTHROPLASTY ANTERIOR APPROACH ANESTHESIA:  Spinal and MAC SURGEON:  Melrose Nakayama MD ASSISTANT:  Loni Dolly PA-C   INDICATIONS FOR PROCEDURE:  The patient is a 55 y.o. male with a long history of a painful hip.  This has persisted despite multiple conservative measures.  The patient has persisted with pain and dysfunction making rest and activity difficult.  A total hip replacement is offered as surgical treatment.  Informed operative consent was obtained after discussion of possible complications including reaction to anesthesia, infection, neurovascular injury, dislocation, DVT, PE, and death.  The importance of the postoperative rehab program to optimize result was stressed with the patient.  SUMMARY OF FINDINGS AND PROCEDURE:  Under the above anesthesia through a anterior approach an the Hana table a left THR was performed.  The patient had severe degenerative change and excellent bone quality.  We used DePuy components to replace the hip and these were size 6 Actis femur capped with a +1.5 38m ceramic hip ball.  On the acetabular side we used a size 54 Gription shell with a plus 0 neutral polyethylene liner.  We did use a hole eliminator.  ALoni DollyPA-C assisted throughout and was invaluable to the completion of the case in that he helped position and retract while I performed the procedure.  He also closed simultaneously to help minimize OR time.  I used fluoroscopy throughout the case to check position of components and leg lengths and read all these views myself.  DESCRIPTION OF PROCEDURE:  The patient was taken to the OR suite where the above anesthetic was applied.  The patient was then positioned on the Hana table supine.  All bony prominences were appropriately padded.  Prep and drape was then performed in normal sterile fashion.  The patient was given kefzol preoperative  antibiotic and an appropriate time out was performed.  We then took an anterior approach to the left hip.  Dissection was taken through adipose to the tensor fascia lata fascia.  This structure was incised longitudinally and we dissected in the intermuscular interval just medial to this muscle.  Cobra retractors were placed superior and inferior to the femoral neck superficial to the capsule.  A capsular incision was then made and the retractors were placed along the femoral neck.  Xray was brought in to get a good level for the femoral neck cut which was made with an oscillating saw and osteotome.  The femoral head was removed with a corkscrew.  The acetabulum was exposed and some labral tissues were excised. Reaming was taken to the inside wall of the pelvis and sequentially up to 1 mm smaller than the actual component.  A trial of components was done and then the aforementioned acetabular shell was placed in appropriate tilt and anteversion confirmed by fluoroscopy. The liner was placed along with the hole eliminator and attention was turned to the femur.  The leg was brought down and over into adduction and the elevator bar was used to raise the femur up gently in the wound.  The piriformis was released with care taken to preserve the obturator internus attachment and all of the posterior capsule. The femur was reamed and then broached to the appropriate size.  A trial reduction was done and the aforementioned head and neck assembly gave u[place]oreathe best stability in extension with external rotation.  Leg lengths were felt to be about equal by fluoroscopic  exam.  The trial components were removed and the wound irrigated.  We then placed the femoral component in appropriate anteversion.  The head was applied to a dry stem neck and the hip again reduced.  It was again stable in the aforementioned position.  The would was irrigated again followed by re-approximation of anterior capsule with ethibond suture. Tensor  fascia was repaired with V-loc suture  followed by deep closure with #O and #2 undyed vicryl.  Skin was closed with subQ stitch and steristrips followed by a sterile dressing.  EBL and IOF can be obtained from anesthesia records.  DISPOSITION:  The patient was extubated in the OR and taken to PACU in stable condition to be admitted to the Orthopedic Surgery for appropriate post-op care to include perioperative antibiotics and DVT prophylaxis.

## 2021-03-05 NOTE — Anesthesia Procedure Notes (Signed)
Procedure Name: Intubation Date/Time: 03/05/2021 7:54 AM Performed by: Lavina Hamman, CRNA Pre-anesthesia Checklist: Patient identified, Emergency Drugs available, Suction available, Patient being monitored and Timeout performed Patient Re-evaluated:Patient Re-evaluated prior to induction Oxygen Delivery Method: Circle system utilized Preoxygenation: Pre-oxygenation with 100% oxygen Induction Type: IV induction Ventilation: Mask ventilation without difficulty Laryngoscope Size: Mac and 4 Grade View: Grade I Tube type: Oral Tube size: 7.5 mm Number of attempts: 1 Airway Equipment and Method: Stylet Placement Confirmation: ETT inserted through vocal cords under direct vision, positive ETCO2, CO2 detector and breath sounds checked- equal and bilateral Secured at: 22 cm Tube secured with: Tape Dental Injury: Teeth and Oropharynx as per pre-operative assessment  Comments: ATOI

## 2021-03-05 NOTE — Anesthesia Postprocedure Evaluation (Signed)
Anesthesia Post Note  Patient: [name]  Procedure(s) Performed: LEFT TOTAL HIP ARTHROPLASTY ANTERIOR APPROACH (Left: Hip)     Patient location during evaluation: PACU Anesthesia Type: General Level of consciousness: oriented and awake and alert Pain management: pain level controlled Vital Signs Assessment: post-procedure vital signs reviewed and stable Respiratory status: spontaneous breathing, respiratory function stable and patient connected to nasal cannula oxygen Cardiovascular status: blood pressure returned to baseline and stable Postop Assessment: no apparent nausea or vomiting Anesthetic complications: no   No notable events documented.  Last Vitals:  Vitals:   03/05/21 1245 03/05/21 1306  BP:  (!) 147/101  Pulse: 78 85  Resp: 11 16  Temp:  37 C  SpO2: 100% 100%                Effie Berkshire

## 2021-03-05 NOTE — Interval H&P Note (Signed)
History and Physical Interval Note:  03/05/2021 7:26 AM  [name]  has presented today for surgery, with the diagnosis of LEFT HIP AVASCULAR NECROSIS.  The various methods of treatment have been discussed with the patient and family. After consideration of risks, benefits and other options for treatment, the patient has consented to  Procedure(s): LEFT TOTAL HIP ARTHROPLASTY ANTERIOR APPROACH (Left) as a surgical intervention.  The patient's history has been reviewed, patient examined, no change in status, stable for surgery.  I have reviewed the patient's chart and labs.  Questions were answered to the patient's satisfaction.     Hessie Dibble

## 2021-03-05 NOTE — Transfer of Care (Signed)
Immediate Anesthesia Transfer of Care Note  Patient: [name]  Procedure(s) Performed: Procedure(s): LEFT TOTAL HIP ARTHROPLASTY ANTERIOR APPROACH (Left)  Patient Location: PACU  Anesthesia Type:General  Level of Consciousness:  sedated, patient cooperative and responds to stimulation  Airway & Oxygen Therapy:Patient Spontanous Breathing and Patient connected to face mask oxgen  Post-op Assessment:  Report given to PACU RN and Post -op Vital signs reviewed and stable  Post vital signs:  Reviewed and stable  Last Vitals:  Vitals:   03/05/21 0626  BP: (!) 141/95  Pulse: 89  Resp: 18  Temp: 36.6 C  SpO2: 456%    Complications: No apparent anesthesia complications

## 2021-03-06 ENCOUNTER — Encounter (HOSPITAL_COMMUNITY): Payer: Self-pay | Admitting: Orthopaedic Surgery

## 2021-03-06 ENCOUNTER — Other Ambulatory Visit (HOSPITAL_COMMUNITY): Payer: Self-pay

## 2021-03-06 DIAGNOSIS — I129 Hypertensive chronic kidney disease with stage 1 through stage 4 chronic kidney disease, or unspecified chronic kidney disease: Secondary | ICD-10-CM | POA: Diagnosis not present

## 2021-03-06 DIAGNOSIS — I2510 Atherosclerotic heart disease of native coronary artery without angina pectoris: Secondary | ICD-10-CM | POA: Diagnosis not present

## 2021-03-06 DIAGNOSIS — Z79899 Other long term (current) drug therapy: Secondary | ICD-10-CM | POA: Diagnosis not present

## 2021-03-06 DIAGNOSIS — N189 Chronic kidney disease, unspecified: Secondary | ICD-10-CM | POA: Diagnosis not present

## 2021-03-06 DIAGNOSIS — E039 Hypothyroidism, unspecified: Secondary | ICD-10-CM | POA: Diagnosis not present

## 2021-03-06 DIAGNOSIS — F17210 Nicotine dependence, cigarettes, uncomplicated: Secondary | ICD-10-CM | POA: Diagnosis not present

## 2021-03-06 DIAGNOSIS — M1612 Unilateral primary osteoarthritis, left hip: Secondary | ICD-10-CM | POA: Diagnosis not present

## 2021-03-06 MED ORDER — OXYCODONE HCL 5 MG PO TABS
5.0000 mg | ORAL_TABLET | Freq: Four times a day (QID) | ORAL | 0 refills | Status: DC | PRN
  Filled 2021-03-06: qty 30, 8d supply, fill #0

## 2021-03-06 MED ORDER — ASPIRIN 81 MG PO CHEW
81.0000 mg | CHEWABLE_TABLET | Freq: Two times a day (BID) | ORAL | 0 refills | Status: DC
  Filled 2021-03-06: qty 45, 23d supply, fill #0

## 2021-03-06 NOTE — Discharge Summary (Signed)
Patient ID: [name] MRN: [id number] DOB/AGE: 10/09/1965 55 y.o.  Admit date: 03/05/2021 Discharge date: 03/06/2021  Admission Diagnoses:  Principal Problem:   Primary osteoarthritis of left hip   Discharge Diagnoses:  Same  Past Medical History:  Diagnosis Date   Accelerated hypertension 08/23/2016   pt denies this   Anxiety    Arthritis    Bipolar affective disorder ([place])    Chronic back pain    Chronic kidney disease 2017   AKF - due to Rhabdomyolysis   Coronary artery disease    minimal Mid LAD to Dist LAD lesion, 35% stenosed. Prox Cx lesion, 40% stenosed.   DDD (degenerative disc disease)    Depression    DJD (degenerative joint disease)    Elevated liver function tests    Fibromyalgia    GERD (gastroesophageal reflux disease)    Head injury    Head injury, closed, with concussion    x 3  from falls   Hyperlipidemia    Hypothyroidism    IBS (irritable bowel syndrome)    with constapation   Inguinal hernia    left   Insomnia    Myofascial pain syndrome    Osteopenia    Rhabdomyolysis    Shortness of breath dyspnea    with exertion   Tibial plateau fracture, left    Tremors of nervous system     Surgeries: Procedure(s): LEFT TOTAL HIP ARTHROPLASTY ANTERIOR APPROACH on 03/05/2021   Consultants:   Discharged Condition: Improved  Hospital Course: [name] is an 55 y.o. male who was admitted 03/05/2021 for operative treatment ofPrimary osteoarthritis of left hip. Patient has severe unremitting pain that affects sleep, daily activities, and work/hobbies. After pre-op clearance the patient was taken to the operating room on 03/05/2021 and underwent  Procedure(s): LEFT TOTAL HIP ARTHROPLASTY ANTERIOR APPROACH.    Patient was given perioperative antibiotics:  Anti-infectives (From admission, onward)    Start     Dose/Rate Route Frequency Ordered Stop   03/05/21 1330  ceFAZolin (ANCEF) IVPB 2g/100 mL premix        2 g 200 mL/hr over 30 Minutes  Intravenous Every 6 hours 03/05/21 0928 03/05/21 1944   03/05/21 0600  ceFAZolin (ANCEF) IVPB 2g/100 mL premix        2 g 200 mL/hr over 30 Minutes Intravenous On call to O.R. 03/05/21 0522 03/05/21 0743        Patient was given sequential compression devices, early ambulation, and chemoprophylaxis to prevent DVT.  Patient benefited maximally from hospital stay and there were no complications.    Recent vital signs: Patient Vitals for the past 24 hrs:  BP Temp Temp src Pulse Resp SpO2  03/06/21 0550 123/75 97.8 F (36.6 C) Oral (!) 103 20 96 %  03/06/21 0256 118/69 97.9 F (36.6 C) Oral (!) 113 18 93 %  03/05/21 2146 (!) 137/93 98.7 F (37.1 C) Oral 100 18 95 %  03/05/21 1550 (!) 134/91 97.9 F (36.6 C) Oral 84 14 96 %  03/05/21 1306 (!) 147/101 98.6 F (37 C) Oral 85 16 100 %  03/05/21 1245 -- -- -- 78 11 100 %  03/05/21 1145 (!) 137/93 -- -- 70 14 100 %  03/05/21 1130 (!) 131/92 -- -- 75 13 100 %  03/05/21 1115 (!) 129/91 98 F (36.7 C) -- 73 (!) 8 100 %  03/05/21 1100 139/89 -- -- 80 13 100 %  03/05/21 1045 (!) 124/93 98 F (36.7 C) -- 73  10 100 %  03/05/21 1030 117/90 -- -- 79 (!) 9 100 %  03/05/21 1015 (!) 133/97 -- -- 81 (!) 9 98 %  03/05/21 1000 132/90 98.1 F (36.7 C) -- 79 13 98 %  03/05/21 0945 123/80 -- -- 86 12 97 %  03/05/21 0930 (!) 129/100 -- -- 85 11 99 %  03/05/21 0918 127/85 98.2 F (36.8 C) -- 85 13 100 %     Recent laboratory studies: No results for input(s): WBC, HGB, HCT, PLT, NA, K, CL, CO2, BUN, CREATININE, GLUCOSE, INR, CALCIUM in the last 72 hours.  Invalid input(s): PT, 2   Discharge Medications:   Allergies as of 03/06/2021       Reactions   Zolpidem Tartrate Other (See Comments)   Hallucinations and sleep walks   Lyrica [pregabalin] Other (See Comments)   Caused extreme depression   Demerol [meperidine] Itching, Rash, Hives        Medication List     TAKE these medications    aspirin 81 MG chewable tablet Chew 1 tablet (81  mg total) by mouth 2 (two) times daily.   benztropine 0.5 MG tablet Commonly known as: COGENTIN Take 1 tablet (0.5 mg total) by mouth 2 (two) times daily.   benztropine 0.5 MG tablet Commonly known as: COGENTIN Take 1 tablet by mouth 2 times every day.   diclofenac Sodium 1 % Gel Commonly known as: VOLTAREN Apply 1 application topically daily as needed (pain).   divalproex 250 MG DR tablet Commonly known as: DEPAKOTE Take 1 tablet (250 mg total) by mouth 3 (three) times daily for bipolar disorder.   divalproex 250 MG DR tablet Commonly known as: DEPAKOTE Take 1 tablet (250 mg total) by mouth 3 (three) times daily.   esomeprazole 40 MG capsule Commonly known as: NEXIUM Take 1 capsule (40 mg total) by mouth daily.   esomeprazole 40 MG capsule Commonly known as: NexIUM Take 1 capsule (40 mg total) by mouth daily.   esomeprazole 40 MG capsule Commonly known as: NexIUM Take 1 capsule by mouth once daily   Fish Oil 1000 MG Caps Take 2,000 mg by mouth 2 (two) times daily.   hydrOXYzine 25 MG capsule Commonly known as: VISTARIL Take 1 capsule (25 mg total) by mouth every 8 (eight) hours as needed for anxiety. What changed: when to take this   hydrOXYzine 50 MG tablet Commonly known as: ATARAX/VISTARIL Take 50 mg by mouth 3 (three) times daily as needed for anxiety.   hydrOXYzine 25 MG tablet Commonly known as: ATARAX/VISTARIL Take 1 tablet by mouth 3 times every day as needed.   lamoTRIgine 100 MG tablet Commonly known as: LAMICTAL Take 1 tablet (100 mg total) by mouth 2 (two) times daily.   lamoTRIgine 100 MG tablet Commonly known as: LAMICTAL Take 1 tablet (100 mg total) by mouth 2 (two) times daily.   lamoTRIgine 100 MG tablet Commonly known as: LAMICTAL Take 1 tablet by mouth twice daily.   levothyroxine 50 MCG tablet Commonly known as: SYNTHROID Take 1 tablet by mouth daily   loxapine 50 MG capsule Commonly known as: LOXITANE Take 1 capsule (50 mg  total) by mouth every 12 (twelve) hours.   loxapine 50 MG capsule Commonly known as: LOXITANE Take 1 capsule (50 mg total) by mouth 2 (two) times daily.   loxapine 50 MG capsule Commonly known as: LOXITANE Take one capsule by mouth 2 times daily.   multivitamin with minerals tablet Take 1 tablet by mouth  daily.   nicotine 21 mg/24hr patch Commonly known as: NICODERM CQ - dosed in mg/24 hours Place 21 mg onto the skin daily.   nicotine 14 mg/24hr patch Commonly known as: NICODERM CQ - dosed in mg/24 hours Place 1 patch (14 mg total) onto the skin daily. (may purchase from over the counter): For smoking cessation   nicotine polacrilex 4 MG lozenge Commonly known as: COMMIT Take 4 mg by mouth as needed for smoking cessation.   nitroGLYCERIN 0.4 MG SL tablet Commonly known as: Nitrostat Dissole 1 Tablet under the tongue every 5 minutes as needed for chest pain. MAX 3/day.   ondansetron 4 MG tablet Commonly known as: ZOFRAN Take 4 mg by mouth every 8 (eight) hours as needed for nausea or vomiting.   oxyCODONE 15 MG immediate release tablet Commonly known as: ROXICODONE Take 1 tablet by mouth four times daily What changed: Another medication with the same name was added. Make sure you understand how and when to take each.   oxyCODONE 5 MG immediate release tablet Commonly known as: Oxy IR/ROXICODONE Take 1 tablet (5 mg total) by mouth every 6 (six) hours as needed for breakthrough pain (in addition to baseline meds for post op pain control.). What changed: You were already taking a medication with the same name, and this prescription was added. Make sure you understand how and when to take each.   propranolol ER 120 MG 24 hr capsule Commonly known as: INDERAL LA Take 1 capsule (120 mg total) by mouth daily.   propranolol ER 120 MG 24 hr capsule Commonly known as: INDERAL LA Take 1 (one) capsule by mouth daily   propranolol ER 60 MG 24 hr capsule Commonly known as:  INDERAL LA Take 1 (one) capsule by mouth daily   traZODone 50 MG tablet Commonly known as: DESYREL Take 1 tablet (50 mg total) by mouth at bedtime if needed.   traZODone 150 MG tablet Commonly known as: DESYREL Take 1 tablet (150 mg total) by mouth daily.   traZODone 150 MG tablet Commonly known as: DESYREL Take one tablet by mouth every day.   Vitamin D (Ergocalciferol) 1.25 MG (50000 UNIT) Caps capsule Commonly known as: DRISDOL Take 1 capsule (50,000 Units total) by mouth every Thursday. For calcium supplementation What changed: additional instructions               Durable Medical Equipment  (From admission, onward)           Start     Ordered   03/05/21 1305  DME Walker rolling  Once       Question:  Patient needs a walker to treat with the following condition  Answer:  Primary osteoarthritis of left hip   03/05/21 1304   03/05/21 1305  DME 3 n 1  Once        03/05/21 1304   03/05/21 1305  DME Bedside commode  Once       Question:  Patient needs a bedside commode to treat with the following condition  Answer:  Primary osteoarthritis of left hip   03/05/21 1304            Diagnostic Studies: CT CERVICAL SPINE W CONTRAST  Result Date: 03/01/2021 CLINICAL DATA:  Cervical pain extending into the left upper extremity of the elbow. Low back and bilateral hip pain left greater than right. FLUOROSCOPY TIME:  dictate in minutes and seconds Radiation Exposure Index (as provided by the fluoroscopic device): 513.82 uGy*m2 PROCEDURE: LUMBAR  PUNCTURE FOR CERVICAL AND LUMBAR MYELOGRAM CERVICAL AND LUMBAR MYELOGRAM CT CERVICAL MYELOGRAM CT LUMBAR MYELOGRAM After thorough discussion of risks and benefits of the procedure including bleeding, infection, injury to nerves, blood vessels, adjacent structures as well as headache and CSF leak, written and oral informed consent was obtained. Consent was obtained by Dr. San Morelle. Patient was positioned prone on the  fluoroscopy table. Local anesthesia was provided with 1% lidocaine without epinephrine after prepped and draped in the usual sterile fashion. Puncture was performed at L2-3 using a 3 1/2 inch 22-gauge spinal needle via right paramedian approach. Using a single pass through the dura, the needle was placed within the thecal sac, with return of clear CSF. 10 mL of Isovue M-300 was injected into the thecal sac, with normal opacification of the nerve roots and cauda equina consistent with free flow within the subarachnoid space. The patient was then moved to the trendelenburg position and contrast flowed into the Cervical spine region. I personally performed the lumbar puncture and administered the intrathecal contrast. I also personally supervised acquisition of the myelogram images. TECHNIQUE: Contiguous axial images were obtained through the Cervical and Lumbar spine after the intrathecal infusion of infusion. Coronal and sagittal reconstructions were obtained of the axial image sets. FINDINGS: CERVICAL AND LUMBAR MYELOGRAM FINDINGS: Five non rib-bearing lumbar type vertebral bodies are present. Slight retrolisthesis present at L4-5. No other significant listhesis present. Listhesis does not change significantly with standing flexion or extension straightening of normal lumbar lordosis is noted. Nerve roots fill normally on both sides. Solid anterior fusion present at C3-4 C4-5 and C5-6. Adjacent level disease is present with slight retrolisthesis ventral impact on the thecal sac. Cervical contrast faint. Please see the CT report below. CT CERVICAL MYELOGRAM FINDINGS: Progressive solid fusion at C3-4. Stable solid fusion at C4-5 C5-6. Slight anterolisthesis present at C2-3. Cervical lordosis is preserved. Vertebral body heights are normal. Hardware is intact. Craniocervical junction is within limits. Cord morphology is present. Soft tissues the neck are otherwise unremarkable. Salivary glands are within normal limits.  Thyroid is normal. No significant adenopathy is present. No focal mucosal or submucosal lesions are present. Atherosclerotic changes are present the aortic arch. T2-3: Shallow central disc protrusion again seen at C2-3. Foramina are patent bilaterally. C3-4: Solid anterior fusion is present. The central canal is decompressed. Right and mild left foraminal narrowing is stable. C4-5: Solid anterior fusion is present. No residual recurrent stenosis is present. C5-6: Solid anterior fusion is present. No residual or recurrent stenosis present. C6-7: A broad-based disc osteophyte complex is again seen. Partial effacement of ventral CSF is noted. Moderate to severe foraminal narrowing bilaterally demonstrates slight progression. C7-T1: Progressive asymmetric right-sided uncovertebral spurring results in progressive moderate right foraminal stenosis. Left foraminal narrowing is similar to the prior exam. CT LUMBAR MYELOGRAM FINDINGS: Five non rib-bearing lumbar type vertebral bodies are present. 5 mm retrolisthesis at L4-5 is stable. No other significant listhesis is present. Mild straightening of the normal lumbar lordosis is stable. Atherosclerotic calcifications present within the aorta and branch vessels. No aneurysm present. No significant abdominal adenopathy is present. L1-2: Negative. L2-3: Leftward disc bulging is present. Left foraminal narrowing is new. L3-4: A broad-based disc protrusion has progressed. Moderate facet hypertrophy and ligamentum flavum thickening contribute to mild central canal stenosis. Moderate foraminal narrowing bilaterally demonstrates some progression. L4-5: Broad-based disc protrusion is again noted. Facet hypertrophy and ligamentum thickening is noted. Moderate bilateral foraminal narrowing is stable. L5-S1: Broad-based disc protrusion is present. Central canal is  patent. Mild bilateral foraminal narrowing demonstrates slight progression. IMPRESSION: 1. Solid anterior fusion at C3-4,  C4-5 and C5-6 scratched at with decompression of the central canal at both levels. 2. Mild left foraminal narrowing at C3-4 is stable. 3. Progressive adjacent level disease at C6-7 with progressive moderate to severe foraminal narrowing bilaterally. 4. Progressive asymmetric right-sided uncovertebral spurring at C7-T1 results in progressive moderate right foraminal stenosis. 5. Left foraminal narrowing at C7-T1 is similar to the prior exam. 6. Progressive moderate foraminal narrowing bilaterally at L3-4 secondary to a broad-based disc protrusion and facet hypertrophy. 7. Mild central canal narrowing at L3-4 is new. 8. Moderate foraminal narrowing bilaterally at L4-5 is stable. 9. Mild bilateral foraminal narrowing at L5-S1 demonstrates slight progression. 10. Leftward foraminal narrowing at L2-3 is new. 11. Aortic Atherosclerosis (ICD10-I70.0). Electronically Signed   By: San Morelle M.D.   On: 03/01/2021 15:28   CT LUMBAR SPINE W CONTRAST  Result Date: 03/01/2021 CLINICAL DATA:  Cervical pain extending into the left upper extremity of the elbow. Low back and bilateral hip pain left greater than right. FLUOROSCOPY TIME:  dictate in minutes and seconds Radiation Exposure Index (as provided by the fluoroscopic device): 513.82 uGy*m2 PROCEDURE: LUMBAR PUNCTURE FOR CERVICAL AND LUMBAR MYELOGRAM CERVICAL AND LUMBAR MYELOGRAM CT CERVICAL MYELOGRAM CT LUMBAR MYELOGRAM After thorough discussion of risks and benefits of the procedure including bleeding, infection, injury to nerves, blood vessels, adjacent structures as well as headache and CSF leak, written and oral informed consent was obtained. Consent was obtained by Dr. San Morelle. Patient was positioned prone on the fluoroscopy table. Local anesthesia was provided with 1% lidocaine without epinephrine after prepped and draped in the usual sterile fashion. Puncture was performed at L2-3 using a 3 1/2 inch 22-gauge spinal needle via right paramedian  approach. Using a single pass through the dura, the needle was placed within the thecal sac, with return of clear CSF. 10 mL of Isovue M-300 was injected into the thecal sac, with normal opacification of the nerve roots and cauda equina consistent with free flow within the subarachnoid space. The patient was then moved to the trendelenburg position and contrast flowed into the Cervical spine region. I personally performed the lumbar puncture and administered the intrathecal contrast. I also personally supervised acquisition of the myelogram images. TECHNIQUE: Contiguous axial images were obtained through the Cervical and Lumbar spine after the intrathecal infusion of infusion. Coronal and sagittal reconstructions were obtained of the axial image sets. FINDINGS: CERVICAL AND LUMBAR MYELOGRAM FINDINGS: Five non rib-bearing lumbar type vertebral bodies are present. Slight retrolisthesis present at L4-5. No other significant listhesis present. Listhesis does not change significantly with standing flexion or extension straightening of normal lumbar lordosis is noted. Nerve roots fill normally on both sides. Solid anterior fusion present at C3-4 C4-5 and C5-6. Adjacent level disease is present with slight retrolisthesis ventral impact on the thecal sac. Cervical contrast faint. Please see the CT report below. CT CERVICAL MYELOGRAM FINDINGS: Progressive solid fusion at C3-4. Stable solid fusion at C4-5 C5-6. Slight anterolisthesis present at C2-3. Cervical lordosis is preserved. Vertebral body heights are normal. Hardware is intact. Craniocervical junction is within limits. Cord morphology is present. Soft tissues the neck are otherwise unremarkable. Salivary glands are within normal limits. Thyroid is normal. No significant adenopathy is present. No focal mucosal or submucosal lesions are present. Atherosclerotic changes are present the aortic arch. T2-3: Shallow central disc protrusion again seen at C2-3. Foramina are  patent bilaterally. C3-4: Solid anterior fusion  is present. The central canal is decompressed. Right and mild left foraminal narrowing is stable. C4-5: Solid anterior fusion is present. No residual recurrent stenosis is present. C5-6: Solid anterior fusion is present. No residual or recurrent stenosis present. C6-7: A broad-based disc osteophyte complex is again seen. Partial effacement of ventral CSF is noted. Moderate to severe foraminal narrowing bilaterally demonstrates slight progression. C7-T1: Progressive asymmetric right-sided uncovertebral spurring results in progressive moderate right foraminal stenosis. Left foraminal narrowing is similar to the prior exam. CT LUMBAR MYELOGRAM FINDINGS: Five non rib-bearing lumbar type vertebral bodies are present. 5 mm retrolisthesis at L4-5 is stable. No other significant listhesis is present. Mild straightening of the normal lumbar lordosis is stable. Atherosclerotic calcifications present within the aorta and branch vessels. No aneurysm present. No significant abdominal adenopathy is present. L1-2: Negative. L2-3: Leftward disc bulging is present. Left foraminal narrowing is new. L3-4: A broad-based disc protrusion has progressed. Moderate facet hypertrophy and ligamentum flavum thickening contribute to mild central canal stenosis. Moderate foraminal narrowing bilaterally demonstrates some progression. L4-5: Broad-based disc protrusion is again noted. Facet hypertrophy and ligamentum thickening is noted. Moderate bilateral foraminal narrowing is stable. L5-S1: Broad-based disc protrusion is present. Central canal is patent. Mild bilateral foraminal narrowing demonstrates slight progression. IMPRESSION: 1. Solid anterior fusion at C3-4, C4-5 and C5-6 scratched at with decompression of the central canal at both levels. 2. Mild left foraminal narrowing at C3-4 is stable. 3. Progressive adjacent level disease at C6-7 with progressive moderate to severe foraminal narrowing  bilaterally. 4. Progressive asymmetric right-sided uncovertebral spurring at C7-T1 results in progressive moderate right foraminal stenosis. 5. Left foraminal narrowing at C7-T1 is similar to the prior exam. 6. Progressive moderate foraminal narrowing bilaterally at L3-4 secondary to a broad-based disc protrusion and facet hypertrophy. 7. Mild central canal narrowing at L3-4 is new. 8. Moderate foraminal narrowing bilaterally at L4-5 is stable. 9. Mild bilateral foraminal narrowing at L5-S1 demonstrates slight progression. 10. Leftward foraminal narrowing at L2-3 is new. 11. Aortic Atherosclerosis (ICD10-I70.0). Electronically Signed   By: San Morelle M.D.   On: 03/01/2021 15:28   DG C-Arm 1-60 Min-No Report  Result Date: 03/05/2021 Fluoroscopy was utilized by the requesting physician.  No radiographic interpretation.   DG MYELOGRAPHY LUMBAR INJ MULTI REGION  Result Date: 03/01/2021 CLINICAL DATA:  Cervical pain extending into the left upper extremity of the elbow. Low back and bilateral hip pain left greater than right. FLUOROSCOPY TIME:  dictate in minutes and seconds Radiation Exposure Index (as provided by the fluoroscopic device): 513.82 uGy*m2 PROCEDURE: LUMBAR PUNCTURE FOR CERVICAL AND LUMBAR MYELOGRAM CERVICAL AND LUMBAR MYELOGRAM CT CERVICAL MYELOGRAM CT LUMBAR MYELOGRAM After thorough discussion of risks and benefits of the procedure including bleeding, infection, injury to nerves, blood vessels, adjacent structures as well as headache and CSF leak, written and oral informed consent was obtained. Consent was obtained by Dr. San Morelle. Patient was positioned prone on the fluoroscopy table. Local anesthesia was provided with 1% lidocaine without epinephrine after prepped and draped in the usual sterile fashion. Puncture was performed at L2-3 using a 3 1/2 inch 22-gauge spinal needle via right paramedian approach. Using a single pass through the dura, the needle was placed within  the thecal sac, with return of clear CSF. 10 mL of Isovue M-300 was injected into the thecal sac, with normal opacification of the nerve roots and cauda equina consistent with free flow within the subarachnoid space. The patient was then moved to the trendelenburg position and contrast  flowed into the Cervical spine region. I personally performed the lumbar puncture and administered the intrathecal contrast. I also personally supervised acquisition of the myelogram images. TECHNIQUE: Contiguous axial images were obtained through the Cervical and Lumbar spine after the intrathecal infusion of infusion. Coronal and sagittal reconstructions were obtained of the axial image sets. FINDINGS: CERVICAL AND LUMBAR MYELOGRAM FINDINGS: Five non rib-bearing lumbar type vertebral bodies are present. Slight retrolisthesis present at L4-5. No other significant listhesis present. Listhesis does not change significantly with standing flexion or extension straightening of normal lumbar lordosis is noted. Nerve roots fill normally on both sides. Solid anterior fusion present at C3-4 C4-5 and C5-6. Adjacent level disease is present with slight retrolisthesis ventral impact on the thecal sac. Cervical contrast faint. Please see the CT report below. CT CERVICAL MYELOGRAM FINDINGS: Progressive solid fusion at C3-4. Stable solid fusion at C4-5 C5-6. Slight anterolisthesis present at C2-3. Cervical lordosis is preserved. Vertebral body heights are normal. Hardware is intact. Craniocervical junction is within limits. Cord morphology is present. Soft tissues the neck are otherwise unremarkable. Salivary glands are within normal limits. Thyroid is normal. No significant adenopathy is present. No focal mucosal or submucosal lesions are present. Atherosclerotic changes are present the aortic arch. T2-3: Shallow central disc protrusion again seen at C2-3. Foramina are patent bilaterally. C3-4: Solid anterior fusion is present. The central canal  is decompressed. Right and mild left foraminal narrowing is stable. C4-5: Solid anterior fusion is present. No residual recurrent stenosis is present. C5-6: Solid anterior fusion is present. No residual or recurrent stenosis present. C6-7: A broad-based disc osteophyte complex is again seen. Partial effacement of ventral CSF is noted. Moderate to severe foraminal narrowing bilaterally demonstrates slight progression. C7-T1: Progressive asymmetric right-sided uncovertebral spurring results in progressive moderate right foraminal stenosis. Left foraminal narrowing is similar to the prior exam. CT LUMBAR MYELOGRAM FINDINGS: Five non rib-bearing lumbar type vertebral bodies are present. 5 mm retrolisthesis at L4-5 is stable. No other significant listhesis is present. Mild straightening of the normal lumbar lordosis is stable. Atherosclerotic calcifications present within the aorta and branch vessels. No aneurysm present. No significant abdominal adenopathy is present. L1-2: Negative. L2-3: Leftward disc bulging is present. Left foraminal narrowing is new. L3-4: A broad-based disc protrusion has progressed. Moderate facet hypertrophy and ligamentum flavum thickening contribute to mild central canal stenosis. Moderate foraminal narrowing bilaterally demonstrates some progression. L4-5: Broad-based disc protrusion is again noted. Facet hypertrophy and ligamentum thickening is noted. Moderate bilateral foraminal narrowing is stable. L5-S1: Broad-based disc protrusion is present. Central canal is patent. Mild bilateral foraminal narrowing demonstrates slight progression. IMPRESSION: 1. Solid anterior fusion at C3-4, C4-5 and C5-6 scratched at with decompression of the central canal at both levels. 2. Mild left foraminal narrowing at C3-4 is stable. 3. Progressive adjacent level disease at C6-7 with progressive moderate to severe foraminal narrowing bilaterally. 4. Progressive asymmetric right-sided uncovertebral spurring at  C7-T1 results in progressive moderate right foraminal stenosis. 5. Left foraminal narrowing at C7-T1 is similar to the prior exam. 6. Progressive moderate foraminal narrowing bilaterally at L3-4 secondary to a broad-based disc protrusion and facet hypertrophy. 7. Mild central canal narrowing at L3-4 is new. 8. Moderate foraminal narrowing bilaterally at L4-5 is stable. 9. Mild bilateral foraminal narrowing at L5-S1 demonstrates slight progression. 10. Leftward foraminal narrowing at L2-3 is new. 11. Aortic Atherosclerosis (ICD10-I70.0). Electronically Signed   By: San Morelle M.D.   On: 03/01/2021 15:28   DG HIP OPERATIVE UNILAT WITH PELVIS LEFT  Result Date: 03/05/2021 CLINICAL DATA:  Left total hip replacement EXAM: OPERATIVE left HIP (WITH PELVIS IF PERFORMED) 2 VIEWS TECHNIQUE: Fluoroscopic spot image(s) were submitted for interpretation post-operatively. COMPARISON:  prior intraoperative images from right hip replacement 03/15/2019 FINDINGS: Two C-arm fluoroscopic images were obtained intraoperatively and submitted for post operative interpretation. The patient is status post bilateral total hip arthroplasty, new on the left. Hardware alignment is within expected limits, without evidence of immediate complication. Fluoro time 16 seconds. Please see the performing provider's procedural report for further detail. IMPRESSION: Status post left hip arthroplasty without evidence of immediate complication. Electronically Signed   By: Valetta Mole M.D.   On: 03/05/2021 11:53    Disposition: Discharge disposition: 01-Home or Self Care       Discharge Instructions     Call MD / Call 911   Complete by: As directed    If you experience chest pain or shortness of breath, CALL 911 and be transported to the hospital emergency room.  If you develope a fever above 101 F, pus (white drainage) or increased drainage or redness at the wound, or calf pain, call your surgeon's office.   Constipation  Prevention   Complete by: As directed    Drink plenty of fluids.  Prune juice may be helpful.  You may use a stool softener, such as Colace (over the counter) 100 mg twice a day.  Use MiraLax (over the counter) for constipation as needed.   Diet - low sodium heart healthy   Complete by: As directed    Discharge instructions   Complete by: As directed    INSTRUCTIONS AFTER JOINT REPLACEMENT   Remove items at home which could result in a fall. This includes throw rugs or furniture in walking pathways ICE to the affected joint every three hours while awake for 30 minutes at a time, for at least the first 3-5 days, and then as needed for pain and swelling.  Continue to use ice for pain and swelling. You may notice swelling that will progress down to the foot and ankle.  This is normal after surgery.  Elevate your leg when you are not up walking on it.   Continue to use the breathing machine you got in the hospital (incentive spirometer) which will help keep your temperature down.  It is common for your temperature to cycle up and down following surgery, especially at night when you are not up moving around and exerting yourself.  The breathing machine keeps your lungs expanded and your temperature down.   DIET:  As you were doing prior to hospitalization, we recommend a well-balanced diet.  DRESSING / WOUND CARE / SHOWERING  You may shower 3 days after surgery, but keep the wounds dry during showering.  You may use an occlusive plastic wrap (Press'n Seal for example), NO SOAKING/SUBMERGING IN THE BATHTUB.  If the bandage gets wet, change with a clean dry gauze.  If the incision gets wet, pat the wound dry with a clean towel.  ACTIVITY  Increase activity slowly as tolerated, but follow the weight bearing instructions below.   No driving for 6 weeks or until further direction given by your physician.  You cannot drive while taking narcotics.  No lifting or carrying greater than 10 lbs. until  further directed by your surgeon. Avoid periods of inactivity such as sitting longer than an hour when not asleep. This helps prevent blood clots.  You may return to work once you are authorized by your  doctor.     WEIGHT BEARING   Weight bearing as tolerated with assist device (walker, cane, etc) as directed, use it as long as suggested by your surgeon or therapist, typically at least 4-6 weeks.   EXERCISES  Results after joint replacement surgery are often greatly improved when you follow the exercise, range of motion and muscle strengthening exercises prescribed by your doctor. Safety measures are also important to protect the joint from further injury. Any time any of these exercises cause you to have increased pain or swelling, decrease what you are doing until you are comfortable again and then slowly increase them. If you have problems or questions, call your caregiver or physical therapist for advice.   Rehabilitation is important following a joint replacement. After just a few days of immobilization, the muscles of the leg can become weakened and shrink (atrophy).  These exercises are designed to build up the tone and strength of the thigh and leg muscles and to improve motion. Often times heat used for twenty to thirty minutes before working out will loosen up your tissues and help with improving the range of motion but do not use heat for the first two weeks following surgery (sometimes heat can increase post-operative swelling).   These exercises can be done on a training (exercise) mat, on the floor, on a table or on a bed. Use whatever works the best and is most comfortable for you.    Use music or television while you are exercising so that the exercises are a pleasant break in your day. This will make your life better with the exercises acting as a break in your routine that you can look forward to.   Perform all exercises about fifteen times, three times per day or as directed.  You  should exercise both the operative leg and the other leg as well.  Exercises include:   Quad Sets - Tighten up the muscle on the front of the thigh (Quad) and hold for 5-10 seconds.   Straight Leg Raises - With your knee straight (if you were given a brace, keep it on), lift the leg to 60 degrees, hold for 3 seconds, and slowly lower the leg.  Perform this exercise against resistance later as your leg gets stronger.  Leg Slides: Lying on your back, slowly slide your foot toward your buttocks, bending your knee up off the floor (only go as far as is comfortable). Then slowly slide your foot back down until your leg is flat on the floor again.  Angel Wings: Lying on your back spread your legs to the side as far apart as you can without causing discomfort.  Hamstring Strength:  Lying on your back, push your heel against the floor with your leg straight by tightening up the muscles of your buttocks.  Repeat, but this time bend your knee to a comfortable angle, and push your heel against the floor.  You may put a pillow under the heel to make it more comfortable if necessary.   A rehabilitation program following joint replacement surgery can speed recovery and prevent re-injury in the future due to weakened muscles. Contact your doctor or a physical therapist for more information on knee rehabilitation.    CONSTIPATION  Constipation is defined medically as fewer than three stools per week and severe constipation as less than one stool per week.  Even if you have a regular bowel pattern at home, your normal regimen is likely to be disrupted due to multiple  reasons following surgery.  Combination of anesthesia, postoperative narcotics, change in appetite and fluid intake all can affect your bowels.   YOU MUST use at least one of the following options; they are listed in order of increasing strength to get the job done.  They are all available over the counter, and you may need to use some, POSSIBLY even  all of these options:    Drink plenty of fluids (prune juice may be helpful) and high fiber foods Colace 100 mg by mouth twice a day  Senokot for constipation as directed and as needed Dulcolax (bisacodyl), take with full glass of water  Miralax (polyethylene glycol) once or twice a day as needed.  If you have tried all these things and are unable to have a bowel movement in the first 3-4 days after surgery call either your surgeon or your primary doctor.    If you experience loose stools or diarrhea, hold the medications until you stool forms back up.  If your symptoms do not get better within 1 week or if they get worse, check with your doctor.  If you experience "the worst abdominal pain ever" or develop nausea or vomiting, please contact the office immediately for further recommendations for treatment.   ITCHING:  If you experience itching with your medications, try taking only a single pain pill, or even half a pain pill at a time.  You can also use Benadryl over the counter for itching or also to help with sleep.   TED HOSE STOCKINGS:  Use stockings on both legs until for at least 2 weeks or as directed by physician office. They may be removed at night for sleeping.  MEDICATIONS:  See your medication summary on the "After Visit Summary" that nursing will review with you.  You may have some home medications which will be placed on hold until you complete the course of blood thinner medication.  It is important for you to complete the blood thinner medication as prescribed.  PRECAUTIONS:  If you experience chest pain or shortness of breath - call 911 immediately for transfer to the hospital emergency department.   If you develop a fever greater that 101 F, purulent drainage from wound, increased redness or drainage from wound, foul odor from the wound/dressing, or calf pain - CONTACT YOUR SURGEON.                                                   FOLLOW-UP APPOINTMENTS:  If you do not  already have a post-op appointment, please call the office for an appointment to be seen by your surgeon.  Guidelines for how soon to be seen are listed in your "After Visit Summary", but are typically between 1-4 weeks after surgery.  OTHER INSTRUCTIONS:   Knee Replacement:  Do not place pillow under knee, focus on keeping the knee straight while resting. CPM instructions: 0-90 degrees, 2 hours in the morning, 2 hours in the afternoon, and 2 hours in the evening. Place foam block, curve side up under heel at all times except when in CPM or when walking.  DO NOT modify, tear, cut, or change the foam block in any way.  POST-OPERATIVE OPIOID TAPER INSTRUCTIONS: It is important to wean off of your opioid medication as soon as possible. If you do not need pain medication after your surgery  it is ok to stop day one. Opioids include: Codeine, Hydrocodone(Norco, Vicodin), Oxycodone(Percocet, oxycontin) and hydromorphone amongst others.  Long term and even short term use of opiods can cause: Increased pain response Dependence Constipation Depression Respiratory depression And more.  Withdrawal symptoms can include Flu like symptoms Nausea, vomiting And more Techniques to manage these symptoms Hydrate well Eat regular healthy meals Stay active Use relaxation techniques(deep breathing, meditating, yoga) Do Not substitute Alcohol to help with tapering If you have been on opioids for less than two weeks and do not have pain than it is ok to stop all together.  Plan to wean off of opioids This plan should start within one week post op of your joint replacement. Maintain the same interval or time between taking each dose and first decrease the dose.  Cut the total daily intake of opioids by one tablet each day Next start to increase the time between doses. The last dose that should be eliminated is the evening dose.     MAKE SURE YOU:  Understand these instructions.  Get help right away if  you are not doing well or get worse.    Thank you for letting us be a part of your medical care team.  It is a privilege we respect greatly.  We hope these instructions will help you stay on track for a fast and full recovery!   Increase activity slowly as tolerated   Complete by: As directed    Post-operative opioid taper instructions:   Complete by: As directed    POST-OPERATIVE OPIOID TAPER INSTRUCTIONS: It is important to wean off of your opioid medication as soon as possible. If you do not need pain medication after your surgery it is ok to stop day one. Opioids include: Codeine, Hydrocodone(Norco, Vicodin), Oxycodone(Percocet, oxycontin) and hydromorphone amongst others.  Long term and even short term use of opiods can cause: Increased pain response Dependence Constipation Depression Respiratory depression And more.  Withdrawal symptoms can include Flu like symptoms Nausea, vomiting And more Techniques to manage these symptoms Hydrate well Eat regular healthy meals Stay active Use relaxation techniques(deep breathing, meditating, yoga) Do Not substitute Alcohol to help with tapering If you have been on opioids for less than two weeks and do not have pain than it is ok to stop all together.  Plan to wean off of opioids This plan should start within one week post op of your joint replacement. Maintain the same interval or time between taking each dose and first decrease the dose.  Cut the total daily intake of opioids by one tablet each day Next start to increase the time between doses. The last dose that should be eliminated is the evening dose.           Follow-up Information     Melrose Nakayama, MD. Schedule an appointment as soon as possible for a visit in 2 week(s).   Specialty: Orthopedic Surgery Contact information: [place] [address] [phone]                  Signed: Larwance Sachs Raivyn Kabler 03/06/2021, 8:00 AM

## 2021-03-06 NOTE — Progress Notes (Signed)
Physical Therapy Treatment Patient Details Name: [name] MRN: [id number] DOB: 1965-10-26 Today's Date: 03/06/2021   History of Present Illness Pt is 55 yo male s/p L anterior THA on 03/05/21.  Pt with hx including OA, bipolar, spinal stenosis, spinal cord stimulator, HLD, head injury, and R THA    PT Comments    Pt is POD # 1 with excellent progression.  Pt transferring and ambulated 300' easily and safely.  Good understanding of safety and HEP and with good pain control.  Pt demonstrates safe gait & transfers in order to return home from PT perspective once discharged by MD.  While in hospital, will continue to benefit from PT for skilled therapy to advance mobility and exercises.      Recommendations for follow up therapy are one component of a multi-disciplinary discharge planning process, led by the attending physician.  Recommendations may be updated based on patient status, additional functional criteria and insurance authorization.  Follow Up Recommendations  Follow surgeon's recommendation for DC plan and follow-up therapies;Supervision for mobility/OOB     Equipment Recommendations  None recommended by PT    Recommendations for Other Services       Precautions / Restrictions Precautions Precautions: Fall Restrictions Weight Bearing Restrictions: No Other Position/Activity Restrictions: WBAT     Mobility  Bed Mobility Overal bed mobility: Needs Assistance Bed Mobility: Supine to Sit     Supine to sit: Supervision          Transfers Overall transfer level: Needs assistance Equipment used: Rolling walker (2 wheeled) Transfers: Sit to/from Stand Sit to Stand: Supervision            Ambulation/Gait Ambulation/Gait assistance: Supervision Gait Distance (Feet): 300 Feet Assistive device: Rolling walker (2 wheeled) Gait Pattern/deviations: Step-through pattern;Decreased stride length Gait velocity: decreased   General Gait Details: Only mild  decrease in weight shift to left; min cues to look up   Stairs Stairs:  (Pt has elevator, pt recalled sequencing for stairs from last surgery)           Wheelchair Mobility    Modified Rankin (Stroke Patients Only)       Balance Overall balance assessment: Needs assistance Sitting-balance support: No upper extremity supported Sitting balance-Leahy Scale: Normal     Standing balance support: Bilateral upper extremity supported;No upper extremity supported Standing balance-Leahy Scale: Fair Standing balance comment: RW to ambulate but could static stand without AD                            Cognition Arousal/Alertness: Awake/alert Behavior During Therapy: WFL for tasks assessed/performed Overall Cognitive Status: Within Functional Limits for tasks assessed                                        Exercises Total Joint Exercises Ankle Circles/Pumps: AROM;Both;10 reps;Supine Quad Sets: AROM;Both;10 reps;Supine Heel Slides: AROM;Both;10 reps;Supine (educated on AAROM with belt if needed) Hip ABduction/ADduction: AAROM;Both;10 reps;Supine (performed AAROM with gait belt) Long Arc Quad: AROM;Both;10 reps;Seated    General Comments  Educated on safe ice use, no pivots, car transfers. Also, encouraged walking every 1-2 hours during day. Educated on HEP with focus on mobility the first weeks. Discussed doing exercises within pain control and if pain increasing could decreased ROM, reps, and stop exercises as needed. Encouraged to perform quad sets and ankle pumps frequently for  blood flow and to promote full knee extension.       Pertinent Vitals/Pain Pain Assessment: 0-10 Pain Score: 3  Pain Location: L hip Pain Descriptors / Indicators: Discomfort;Sore Pain Intervention(s): Limited activity within patient's tolerance;Monitored during session;Premedicated before session    Home Living                      Prior Function             PT Goals (current goals can now be found in the care plan section) Progress towards PT goals: Progressing toward goals    Frequency    7X/week      PT Plan Current plan remains appropriate    Co-evaluation              AM-PAC PT "6 Clicks" Mobility   Outcome Measure  Help needed turning from your back to your side while in a flat bed without using bedrails?: None Help needed moving from lying on your back to sitting on the side of a flat bed without using bedrails?: None Help needed moving to and from a bed to a chair (including a wheelchair)?: A Little Help needed standing up from a chair using your arms (e.g., wheelchair or bedside chair)?: A Little Help needed to walk in hospital room?: A Little Help needed climbing 3-5 steps with a railing? : A Little 6 Click Score: 20    End of Session Equipment Utilized During Treatment: Gait belt Activity Tolerance: Patient tolerated treatment well Patient left: with chair alarm set;in chair;with call bell/phone within reach Nurse Communication: Mobility status PT Visit Diagnosis: Other abnormalities of gait and mobility (R26.89);Muscle weakness (generalized) (M62.81)     Time: 9407-6808 PT Time Calculation (min) (ACUTE ONLY): 15 min  Charges:  $Therapeutic Exercise: 8-22 mins                     Abran Richard, PT Acute Rehab Services Pager [phone] [facility] [place] 03/06/2021, 11:31 AM

## 2021-03-06 NOTE — Progress Notes (Signed)
Subjective: 1 Day Post-Op Procedure(s) (LRB): LEFT TOTAL HIP ARTHROPLASTY ANTERIOR APPROACH (Left)  Patient doing well. Pain well controlled. He is hoping to go home today.  Activity level:  wbat Diet tolerance:  ok Voiding:  ok Patient reports pain as mild.    Objective: Vital signs in last 24 hours: Temp:  [97.8 F (36.6 C)-98.7 F (37.1 C)] 97.8 F (36.6 C) (10/05 0550) Pulse Rate:  [70-113] 103 (10/05 0550) Resp:  [8-20] 20 (10/05 0550) BP: (117-147)/(69-101) 123/75 (10/05 0550) SpO2:  [93 %-100 %] 96 % (10/05 0550)  Labs: No results for input(s): HGB in the last 72 hours. No results for input(s): WBC, RBC, HCT, PLT in the last 72 hours. No results for input(s): NA, K, CL, CO2, BUN, CREATININE, GLUCOSE, CALCIUM in the last 72 hours. No results for input(s): LABPT, INR in the last 72 hours.  Physical Exam:  Neurologically intact ABD soft Neurovascular intact Sensation intact distally Intact pulses distally Dorsiflexion/Plantar flexion intact Incision: dressing C/D/I and no drainage No cellulitis present Compartment soft  Assessment/Plan:  1 Day Post-Op Procedure(s) (LRB): LEFT TOTAL HIP ARTHROPLASTY ANTERIOR APPROACH (Left) Advance diet Up with therapy D/C IV fluids Discharge home with home health today after PT. Continue on 81mg  asa BID for dvt prevention. Follow up in office 2 weeks post op.  [name] 03/06/2021, 7:56 AM

## 2021-03-07 ENCOUNTER — Other Ambulatory Visit (HOSPITAL_COMMUNITY): Payer: Self-pay

## 2021-03-07 DIAGNOSIS — Z471 Aftercare following joint replacement surgery: Secondary | ICD-10-CM | POA: Diagnosis not present

## 2021-03-07 DIAGNOSIS — G894 Chronic pain syndrome: Secondary | ICD-10-CM | POA: Diagnosis not present

## 2021-03-07 DIAGNOSIS — G4700 Insomnia, unspecified: Secondary | ICD-10-CM | POA: Diagnosis not present

## 2021-03-07 DIAGNOSIS — E785 Hyperlipidemia, unspecified: Secondary | ICD-10-CM | POA: Diagnosis not present

## 2021-03-07 DIAGNOSIS — M8580 Other specified disorders of bone density and structure, unspecified site: Secondary | ICD-10-CM | POA: Diagnosis not present

## 2021-03-07 DIAGNOSIS — M4802 Spinal stenosis, cervical region: Secondary | ICD-10-CM | POA: Diagnosis not present

## 2021-03-07 DIAGNOSIS — M797 Fibromyalgia: Secondary | ICD-10-CM | POA: Diagnosis not present

## 2021-03-07 DIAGNOSIS — M5136 Other intervertebral disc degeneration, lumbar region: Secondary | ICD-10-CM | POA: Diagnosis not present

## 2021-03-07 DIAGNOSIS — Z96643 Presence of artificial hip joint, bilateral: Secondary | ICD-10-CM | POA: Diagnosis not present

## 2021-03-07 MED ORDER — METHOCARBAMOL 500 MG PO TABS
ORAL_TABLET | ORAL | 1 refills | Status: DC
  Filled 2021-03-07: qty 40, 10d supply, fill #0
  Filled 2021-03-23: qty 40, 10d supply, fill #1

## 2021-03-08 DIAGNOSIS — M5136 Other intervertebral disc degeneration, lumbar region: Secondary | ICD-10-CM | POA: Diagnosis not present

## 2021-03-08 DIAGNOSIS — G4700 Insomnia, unspecified: Secondary | ICD-10-CM | POA: Diagnosis not present

## 2021-03-08 DIAGNOSIS — M797 Fibromyalgia: Secondary | ICD-10-CM | POA: Diagnosis not present

## 2021-03-08 DIAGNOSIS — M8580 Other specified disorders of bone density and structure, unspecified site: Secondary | ICD-10-CM | POA: Diagnosis not present

## 2021-03-08 DIAGNOSIS — M4802 Spinal stenosis, cervical region: Secondary | ICD-10-CM | POA: Diagnosis not present

## 2021-03-08 DIAGNOSIS — E785 Hyperlipidemia, unspecified: Secondary | ICD-10-CM | POA: Diagnosis not present

## 2021-03-08 DIAGNOSIS — Z471 Aftercare following joint replacement surgery: Secondary | ICD-10-CM | POA: Diagnosis not present

## 2021-03-08 DIAGNOSIS — Z96643 Presence of artificial hip joint, bilateral: Secondary | ICD-10-CM | POA: Diagnosis not present

## 2021-03-08 DIAGNOSIS — G894 Chronic pain syndrome: Secondary | ICD-10-CM | POA: Diagnosis not present

## 2021-03-11 ENCOUNTER — Other Ambulatory Visit (HOSPITAL_COMMUNITY): Payer: Self-pay

## 2021-03-12 DIAGNOSIS — I700 Atherosclerosis of aorta: Secondary | ICD-10-CM | POA: Diagnosis not present

## 2021-03-12 DIAGNOSIS — Z471 Aftercare following joint replacement surgery: Secondary | ICD-10-CM | POA: Diagnosis not present

## 2021-03-12 DIAGNOSIS — M797 Fibromyalgia: Secondary | ICD-10-CM | POA: Diagnosis not present

## 2021-03-12 DIAGNOSIS — G4700 Insomnia, unspecified: Secondary | ICD-10-CM | POA: Diagnosis not present

## 2021-03-12 DIAGNOSIS — G8918 Other acute postprocedural pain: Secondary | ICD-10-CM | POA: Diagnosis not present

## 2021-03-12 DIAGNOSIS — M4802 Spinal stenosis, cervical region: Secondary | ICD-10-CM | POA: Diagnosis not present

## 2021-03-12 DIAGNOSIS — G894 Chronic pain syndrome: Secondary | ICD-10-CM | POA: Diagnosis not present

## 2021-03-12 DIAGNOSIS — Z96643 Presence of artificial hip joint, bilateral: Secondary | ICD-10-CM | POA: Diagnosis not present

## 2021-03-12 DIAGNOSIS — W19XXXA Unspecified fall, initial encounter: Secondary | ICD-10-CM | POA: Diagnosis not present

## 2021-03-12 DIAGNOSIS — M5136 Other intervertebral disc degeneration, lumbar region: Secondary | ICD-10-CM | POA: Diagnosis not present

## 2021-03-12 DIAGNOSIS — E785 Hyperlipidemia, unspecified: Secondary | ICD-10-CM | POA: Diagnosis not present

## 2021-03-12 DIAGNOSIS — M25552 Pain in left hip: Secondary | ICD-10-CM | POA: Diagnosis not present

## 2021-03-12 DIAGNOSIS — Z043 Encounter for examination and observation following other accident: Secondary | ICD-10-CM | POA: Diagnosis not present

## 2021-03-12 DIAGNOSIS — M8580 Other specified disorders of bone density and structure, unspecified site: Secondary | ICD-10-CM | POA: Diagnosis not present

## 2021-03-13 ENCOUNTER — Other Ambulatory Visit (HOSPITAL_COMMUNITY): Payer: Self-pay

## 2021-03-13 MED ORDER — METHOCARBAMOL 500 MG PO TABS
ORAL_TABLET | ORAL | 0 refills | Status: DC
  Filled 2021-03-13: qty 20, 5d supply, fill #0

## 2021-03-13 MED ORDER — OXYCODONE HCL 5 MG PO TABS
ORAL_TABLET | ORAL | 0 refills | Status: DC
  Filled 2021-03-13: qty 20, 5d supply, fill #0

## 2021-03-14 DIAGNOSIS — M8580 Other specified disorders of bone density and structure, unspecified site: Secondary | ICD-10-CM | POA: Diagnosis not present

## 2021-03-14 DIAGNOSIS — Z471 Aftercare following joint replacement surgery: Secondary | ICD-10-CM | POA: Diagnosis not present

## 2021-03-14 DIAGNOSIS — G4700 Insomnia, unspecified: Secondary | ICD-10-CM | POA: Diagnosis not present

## 2021-03-14 DIAGNOSIS — M4802 Spinal stenosis, cervical region: Secondary | ICD-10-CM | POA: Diagnosis not present

## 2021-03-14 DIAGNOSIS — M797 Fibromyalgia: Secondary | ICD-10-CM | POA: Diagnosis not present

## 2021-03-14 DIAGNOSIS — M5136 Other intervertebral disc degeneration, lumbar region: Secondary | ICD-10-CM | POA: Diagnosis not present

## 2021-03-14 DIAGNOSIS — Z96643 Presence of artificial hip joint, bilateral: Secondary | ICD-10-CM | POA: Diagnosis not present

## 2021-03-14 DIAGNOSIS — E785 Hyperlipidemia, unspecified: Secondary | ICD-10-CM | POA: Diagnosis not present

## 2021-03-14 DIAGNOSIS — G894 Chronic pain syndrome: Secondary | ICD-10-CM | POA: Diagnosis not present

## 2021-03-15 ENCOUNTER — Other Ambulatory Visit (HOSPITAL_COMMUNITY): Payer: Self-pay

## 2021-03-17 ENCOUNTER — Emergency Department (HOSPITAL_COMMUNITY)
Admission: EM | Admit: 2021-03-17 | Discharge: 2021-03-17 | Disposition: A | Discharge status: Home / Self Care | Payer: 59 | Attending: Emergency Medicine | Admitting: Emergency Medicine

## 2021-03-17 ENCOUNTER — Encounter (HOSPITAL_COMMUNITY): Payer: Self-pay | Admitting: Emergency Medicine

## 2021-03-17 ENCOUNTER — Emergency Department (HOSPITAL_COMMUNITY): Payer: 59

## 2021-03-17 ENCOUNTER — Other Ambulatory Visit: Payer: Self-pay

## 2021-03-17 DIAGNOSIS — Z7982 Long term (current) use of aspirin: Secondary | ICD-10-CM | POA: Insufficient documentation

## 2021-03-17 DIAGNOSIS — S73005A Unspecified dislocation of left hip, initial encounter: Secondary | ICD-10-CM | POA: Insufficient documentation

## 2021-03-17 DIAGNOSIS — N189 Chronic kidney disease, unspecified: Secondary | ICD-10-CM | POA: Diagnosis not present

## 2021-03-17 DIAGNOSIS — I959 Hypotension, unspecified: Secondary | ICD-10-CM | POA: Diagnosis not present

## 2021-03-17 DIAGNOSIS — X58XXXA Exposure to other specified factors, initial encounter: Secondary | ICD-10-CM | POA: Diagnosis not present

## 2021-03-17 DIAGNOSIS — F17210 Nicotine dependence, cigarettes, uncomplicated: Secondary | ICD-10-CM | POA: Insufficient documentation

## 2021-03-17 DIAGNOSIS — Z79899 Other long term (current) drug therapy: Secondary | ICD-10-CM | POA: Insufficient documentation

## 2021-03-17 DIAGNOSIS — T84021A Dislocation of internal left hip prosthesis, initial encounter: Secondary | ICD-10-CM

## 2021-03-17 DIAGNOSIS — Z96643 Presence of artificial hip joint, bilateral: Secondary | ICD-10-CM | POA: Diagnosis not present

## 2021-03-17 DIAGNOSIS — I129 Hypertensive chronic kidney disease with stage 1 through stage 4 chronic kidney disease, or unspecified chronic kidney disease: Secondary | ICD-10-CM | POA: Diagnosis not present

## 2021-03-17 DIAGNOSIS — I2510 Atherosclerotic heart disease of native coronary artery without angina pectoris: Secondary | ICD-10-CM | POA: Diagnosis not present

## 2021-03-17 DIAGNOSIS — Z9889 Other specified postprocedural states: Secondary | ICD-10-CM

## 2021-03-17 DIAGNOSIS — S73005D Unspecified dislocation of left hip, subsequent encounter: Secondary | ICD-10-CM | POA: Diagnosis not present

## 2021-03-17 DIAGNOSIS — S79912A Unspecified injury of left hip, initial encounter: Secondary | ICD-10-CM | POA: Diagnosis present

## 2021-03-17 DIAGNOSIS — T84020A Dislocation of internal right hip prosthesis, initial encounter: Secondary | ICD-10-CM | POA: Diagnosis not present

## 2021-03-17 DIAGNOSIS — E039 Hypothyroidism, unspecified: Secondary | ICD-10-CM | POA: Insufficient documentation

## 2021-03-17 DIAGNOSIS — S73015A Posterior dislocation of left hip, initial encounter: Secondary | ICD-10-CM | POA: Diagnosis not present

## 2021-03-17 MED ORDER — METHOCARBAMOL 500 MG PO TABS
500.0000 mg | ORAL_TABLET | Freq: Once | ORAL | Status: AC
  Administered 2021-03-17: 500 mg via ORAL
  Filled 2021-03-17: qty 1

## 2021-03-17 MED ORDER — HYDROMORPHONE HCL 1 MG/ML IJ SOLN
1.0000 mg | Freq: Once | INTRAMUSCULAR | Status: AC
  Administered 2021-03-17: 1 mg via INTRAVENOUS
  Filled 2021-03-17: qty 1

## 2021-03-17 MED ORDER — OXYCODONE HCL 5 MG PO TABS
15.0000 mg | ORAL_TABLET | Freq: Once | ORAL | Status: AC
  Administered 2021-03-17: 15 mg via ORAL
  Filled 2021-03-17: qty 3

## 2021-03-17 MED ORDER — PROPOFOL 10 MG/ML IV BOLUS
100.0000 mg | Freq: Once | INTRAVENOUS | Status: AC
  Administered 2021-03-17: 100 mg via INTRAVENOUS
  Filled 2021-03-17: qty 20

## 2021-03-17 MED ORDER — KETAMINE HCL 50 MG/5ML IJ SOSY
100.0000 mg | PREFILLED_SYRINGE | Freq: Once | INTRAMUSCULAR | Status: AC
  Administered 2021-03-17: 100 mg via INTRAVENOUS
  Filled 2021-03-17: qty 10

## 2021-03-17 NOTE — Discharge Instructions (Addendum)
You seen in the emergency department today for left hip pain.  You were able to successfully reduce your hip dislocation and have placed you in an immobilizer. You can continue taking your prescribed pain medications and I am prescribing you muscle relaxers. I've also attached other instructions how to best care for your hip following the procedure today.   You should call Dr. Rhona Raider on Monday to make a follow up clinic visit.   Continue to monitor how you're doing and return to the ER for new or worsening symptoms such as re-injury, numbness or tingling.   It has been a pleasure seeing and caring for you today and I hope you start feeling better soon!

## 2021-03-17 NOTE — ED Provider Notes (Signed)
  Physical Exam  BP (!) 161/106   Pulse 86   Temp 97.8 F (36.6 C) (Oral)   Resp 18   SpO2 100%   Physical Exam  ED Course/Procedures     Reduction of dislocation  Date/Time: 03/17/2021 5:10 PM Performed by: Lacretia Leigh, MD Authorized by: Lacretia Leigh, MD  Consent: Verbal consent obtained. Written consent obtained. Risks and benefits: risks, benefits and alternatives were discussed Time out: Immediately prior to procedure a "time out" was called to verify the correct patient, procedure, equipment, support staff and site/side marked as required. Preparation: Patient was prepped and draped in the usual sterile fashion. Local anesthesia used: no  Anesthesia: Local anesthesia used: no Patient tolerance: patient tolerated the procedure well with no immediate complications    MDM         Lacretia Leigh, MD 03/17/21 1711

## 2021-03-17 NOTE — ED Provider Notes (Signed)
[facility] DEPT Provider Note   CSN: [id number] Arrival date & time: 03/17/21  1424     History Chief Complaint  Patient presents with   Hip Pain    ORLAN AVERSA is a 55 y.o. male who presents with left hip pain.  Patient recently had hip replacement on 10/4, states that he was straining to have a bowel movement today and felt a pop.  He has had severe left hip pain since.  His pain is worse with walking or movement.  No other complications leading up to today, states he was walking and exercising well.   Hip Pain      Past Medical History:  Diagnosis Date   Accelerated hypertension 08/23/2016   pt denies this   Anxiety    Arthritis    Bipolar affective disorder ([place])    Chronic back pain    Chronic kidney disease 2017   AKF - due to Rhabdomyolysis   Coronary artery disease    minimal Mid LAD to Dist LAD lesion, 35% stenosed. Prox Cx lesion, 40% stenosed.   DDD (degenerative disc disease)    Depression    DJD (degenerative joint disease)    Elevated liver function tests    Fibromyalgia    GERD (gastroesophageal reflux disease)    Head injury    Head injury, closed, with concussion    x 3  from falls   Hyperlipidemia    Hypothyroidism    IBS (irritable bowel syndrome)    with constapation   Inguinal hernia    left   Insomnia    Myofascial pain syndrome    Osteopenia    Rhabdomyolysis    Shortness of breath dyspnea    with exertion   Tibial plateau fracture, left    Tremors of nervous system     Patient Active Problem List   Diagnosis Date Noted   Primary osteoarthritis of left hip 03/05/2021   Primary localized osteoarthritis of right hip 03/15/2019   Primary osteoarthritis of right hip 03/15/2019   Pseudoarthrosis of cervical spine ([place]) 12/23/2017   Bipolar I disorder, most recent episode depressed, severe with psychotic features ([place]) 10/04/2017   Spinal stenosis of cervical region 09/21/2017   Chronic pain  syndrome 10/28/2016   Cannabis use disorder, mild, abuse 10/28/2016   Rhabdomyolysis 08/23/2016   AKI (acute kidney injury) ([place]) 08/23/2016   Transaminitis 08/23/2016   Drug overdose, multiple drugs 08/23/2016   Tachycardia 08/23/2016   Accelerated hypertension 08/23/2016   Bipolar disorder, curr episode mixed, severe, with psychotic features ([place]) 12/26/2015   Cocaine use disorder, mild, abuse ([place]) 12/26/2015   History of rhabdomyolysis 12/26/2015   Liver injury 12/26/2015   Hx of renal failure 12/26/2015   Angina, class III ([place]) 08/18/2015   Dyslipidemia, goal LDL below 70 08/18/2015   Family history of premature coronary artery disease 08/18/2015   Tobacco use disorder, continuous 08/18/2015   Tibial plateau fracture 10/02/2013   Multiple falls 10/02/2013   Alteration in self-care ability 10/02/2013   Hypothyroidism, lithium induced 10/02/2013   Fracture of tibia with fibula, left, closed 10/02/2013   Chronic back pain 10/02/2013   Spinal cord stimulator, status-post 10/02/2013   History of suicidal ideation 10/02/2013   Postural imbalance with levoscoliosis, h/o 10/02/2013   Degenerative disc disease, lumbar 09/07/2013   Chest pain 12/25/2012   Leg pain 02/24/2011   Neck pain 02/24/2011   Right hip pain 02/24/2011   IRRITABLE BOWEL SYNDROME 08/09/2008   HYPERLIPIDEMIA 10/28/2006  DISORDER, BIPOLAR NOS 10/28/2006   ANXIETY 10/28/2006   DEPRESSION 10/28/2006   PAIN, CHRONIC NEC 10/28/2006   GERD 10/28/2006    Past Surgical History:  Procedure Laterality Date   ANTERIOR CERVICAL DECOMP/DISCECTOMY FUSION N/A 09/21/2017   Procedure: ANTERIOR CERVICAL DECOMPRESSION/DISCECTOMY FUSION - CERVICAL THREE-CERVICAL FOUR;  Surgeon: Kary Kos, MD;  Location: [facility];  Service: Neurosurgery;  Laterality: N/A;   ANTERIOR CERVICAL DECOMP/DISCECTOMY FUSION N/A 12/23/2017   Procedure: REMOVAL AND REPLACEMENT OF CERVICAL THREE-FOUR HARDWARE;  Surgeon: Kary Kos, MD;  Location: [facility];   Service: Neurosurgery;  Laterality: N/A;   CARDIAC CATHETERIZATION N/A 08/31/2015   Procedure: Left Heart Cath and Coronary Angiography;  Surgeon: Leonie Man, MD;  Location: [facility];  Service: Cardiovascular;  Laterality: N/A;   Carotid Dopplers Bilateral 02/20/2015   [facility]: Mild, less than 39% left and right internal carotid artery stenosis. No significant plaque burden   CERVICAL DISC SURGERY     C5-7   COLONOSCOPY     COLONOSCOPY     ESOPHAGOGASTRODUODENOSCOPY     FASCIOTOMY Left 10/20/2013   Procedure: LEFT leg ANTERIOR COMPARTMENT FACSCIOTOMY;  Surgeon: Rozanna Box, MD;  Location: [facility];  Service: Orthopedics;  Laterality: Left;   INGUINAL HERNIA REPAIR Left    Nuclear Stress Test  03/06/2015   [facility]: Normal EKG. Low normal EF (49%) normal regional wall motion. No evidence of ischemia or infarction.   ORIF TIBIA PLATEAU Left 10/20/2013   Procedure: OPEN REDUCTION INTERNAL FIXATION (ORIF) LEFT TIBIAL PLATEAU;  Surgeon: Rozanna Box, MD;  Location: [facility];  Service: Orthopedics;  Laterality: Left;   SEPTOPLASTY  2010   SPINAL CORD STIMULATOR BATTERY EXCHANGE N/A 02/08/2016   Procedure: Lumbar spinal cord stimulator implantable pulse generator replacement;  Surgeon: Clydell Hakim, MD;  Location: [facility];  Service: Neurosurgery;  Laterality: N/A;   SPINAL CORD STIMULATOR BATTERY EXCHANGE N/A 12/23/2017   Procedure: SPINAL CORD STIMULATOR BATTERY EXCHANGE;  Surgeon: Clydell Hakim, MD;  Location: [place];  Service: Neurosurgery;  Laterality: N/A;   SPINAL CORD STIMULATOR IMPLANT     TOTAL HIP ARTHROPLASTY Right 03/15/2019   Procedure: RIGHT TOTAL HIP ARTHROPLASTY ANTERIOR APPROACH;  Surgeon: Melrose Nakayama, MD;  Location: [facility];  Service: Orthopedics;  Laterality: Right;   TOTAL HIP ARTHROPLASTY Left 03/05/2021   Procedure: LEFT TOTAL HIP ARTHROPLASTY ANTERIOR APPROACH;  Surgeon: Melrose Nakayama, MD;  Location: [facility];  Service:  Orthopedics;  Laterality: Left;   TRANSTHORACIC ECHOCARDIOGRAM  02/20/2015   [facility]: Mild concentric LVH. EF 55-60%. Normal regional wall motion. Mild to moderate TR with no significant pulmonary hypertension.       Family History  Problem Relation Age of Onset   Heart disease Mother    Hypertension Mother    Sudden death Mother        Presumably cardiac   Hyperlipidemia Father    Heart attack Father 43       At least 5 MIs. Had CABG.   Heart failure Father    Diabetes Father    Hypertension Father    Kidney disease Father    Diabetes Brother    Lung cancer Maternal Grandmother    Diabetes Paternal Grandmother    Sudden death Paternal Grandmother        Unclear etiology   Diabetes Other    Kidney disease Other    Kidney disease Brother    Hypertension Brother    Sudden death Brother  Thought to be related to heart disease   Throat cancer Brother    Colon cancer Neg Hx    Mental illness Neg Hx     Social History   Tobacco Use   Smoking status: Every Day    Packs/day: 0.25    Years: 40.00    Pack years: 10.00    Types: Cigarettes   Smokeless tobacco: Never  Vaping Use   Vaping Use: Never used  Substance Use Topics   Alcohol use: No    Alcohol/week: 0.0 standard drinks    Comment: none since 1999- heavy drinker in past   Drug use: Not Currently    Types: Oxycodone, Cocaine    Comment: rarely- last 2015-    Home Medications Prior to Admission medications   Medication Sig Start Date End Date Taking? Authorizing Provider  aspirin 81 MG chewable tablet Chew 1 tablet (81 mg total) by mouth 2 (two) times daily. 03/06/21  Yes Loni Dolly, PA-C  benztropine (COGENTIN) 0.5 MG tablet Take 1 tablet by mouth 2 times every day. Patient taking differently: Take 0.5 mg by mouth 2 (two) times daily. 02/21/21  Yes   diclofenac Sodium (VOLTAREN) 1 % GEL Apply 2 g topically daily as needed (to affected areas for pain).   Yes [provider]   esomeprazole (NEXIUM) 40 MG capsule Take 1 capsule by mouth once daily Patient taking differently: Take 40 mg by mouth daily before breakfast. 02/21/21  Yes   hydrOXYzine (ATARAX/VISTARIL) 25 MG tablet Take 1 tablet by mouth 3 times every day as needed. Patient taking differently: Take 25 mg by mouth 3 (three) times daily. 02/21/21  Yes   lamoTRIgine (LAMICTAL) 100 MG tablet Take 1 tablet (100 mg total) by mouth 2 (two) times daily. 12/28/20  Yes   levothyroxine (SYNTHROID) 50 MCG tablet Take 1 tablet by mouth daily Patient taking differently: Take 50 mcg by mouth daily before breakfast. 02/21/21  Yes   loxapine (LOXITANE) 50 MG capsule Take 1 capsule (50 mg total) by mouth 2 (two) times daily. 12/28/20  Yes   methocarbamol (ROBAXIN) 500 MG tablet Take 1 tablet by mouth every six hours as needed for pain and spasm Patient taking differently: Take 500 mg by mouth every 6 (six) hours as needed for muscle spasms (and/or pain). 03/07/21  Yes   Multiple Vitamins-Minerals (MULTIVITAMIN WITH MINERALS) tablet Take 1 tablet by mouth daily.   Yes [provider]  nicotine (NICODERM CQ - DOSED IN MG/24 HOURS) 21 mg/24hr patch Place 21 mg onto the skin daily as needed (FOR SMOKING CESSATION WHEN HOSPITALIZED).   Yes [provider]  nicotine polacrilex (COMMIT) 4 MG lozenge Take 4 mg by mouth as needed for smoking cessation.   Yes [provider]  nitroGLYCERIN (NITROSTAT) 0.4 MG SL tablet Dissole 1 Tablet under the tongue every 5 minutes as needed for chest pain. MAX 3/day. Patient taking differently: Place 0.4 mg under the tongue every 5 (five) minutes as needed for chest pain (MAX OF 3 TABLETS IN A DAY). 01/21/21  Yes   Omega-3 Fatty Acids (FISH OIL) 1000 MG CAPS Take 2,000 mg by mouth 2 (two) times daily.   Yes [provider]  ondansetron (ZOFRAN) 4 MG tablet Take 4 mg by mouth every 8 (eight) hours as needed for nausea or vomiting. 06/29/20  Yes [provider]   oxyCODONE (OXY IR/ROXICODONE) 5 MG immediate release tablet Take 1 tablet by mouth every six hours as needed in addition to baseline  meds for post op pain Patient taking differently: Take 5 mg by mouth 4 (four) times daily. 03/13/21  Yes   oxyCODONE (ROXICODONE) 15 MG immediate release tablet Take 1 tablet by mouth four times daily 02/21/21  Yes   propranolol ER (INDERAL LA) 60 MG 24 hr capsule Take 1 (one) capsule by mouth daily Patient taking differently: Take 60 mg by mouth in the morning. 02/21/21  Yes   traZODone (DESYREL) 150 MG tablet Take one tablet by mouth every day. Patient taking differently: Take 150 mg by mouth at bedtime. 02/21/21  Yes   Vitamin D, Ergocalciferol, (DRISDOL) 50000 units CAPS capsule Take 1 capsule (50,000 Units total) by mouth every Thursday. For calcium supplementation Patient taking differently: Take 50,000 Units by mouth every Thursday. 10/15/17  Yes Lindell Spar I, NP  benztropine (COGENTIN) 0.5 MG tablet Take 1 tablet (0.5 mg total) by mouth 2 (two) times daily. Patient not taking: Reported on 03/17/2021 12/28/20     divalproex (DEPAKOTE) 250 MG DR tablet Take 1 tablet (250 mg total) by mouth 3 (three) times daily. Patient not taking: No sig reported 12/28/20     divalproex (DEPAKOTE) 250 MG DR tablet Take 1 tablet (250 mg total) by mouth 3 (three) times daily for bipolar disorder. Patient not taking: No sig reported 11/01/20     esomeprazole (NEXIUM) 40 MG capsule Take 1 capsule (40 mg total) by mouth daily. Patient not taking: Reported on 03/17/2021 11/09/20     esomeprazole (NEXIUM) 40 MG capsule Take 1 capsule (40 mg total) by mouth daily. Patient not taking: No sig reported 02/11/21     hydrOXYzine (VISTARIL) 25 MG capsule Take 1 capsule (25 mg total) by mouth every 8 (eight) hours as needed for anxiety. Patient not taking: Reported on 03/17/2021 11/01/20     lamoTRIgine (LAMICTAL) 100 MG tablet Take 1 tablet (100 mg total) by mouth 2 (two) times daily. Patient  not taking: Reported on 03/17/2021 12/10/20     lamoTRIgine (LAMICTAL) 100 MG tablet Take 1 tablet by mouth twice daily. Patient not taking: Reported on 03/17/2021 02/21/21     loxapine (LOXITANE) 50 MG capsule Take 1 capsule (50 mg total) by mouth every 12 (twelve) hours. Patient not taking: Reported on 03/17/2021 12/10/20     loxapine (LOXITANE) 50 MG capsule Take one capsule by mouth 2 times daily. Patient not taking: Reported on 03/17/2021 02/21/21     methocarbamol (ROBAXIN) 500 MG tablet Take 1 tablet by mouth every six hours as needed for pain and spasm Patient not taking: Reported on 03/17/2021 03/13/21     nicotine (NICODERM CQ - DOSED IN MG/24 HOURS) 14 mg/24hr patch Place 1 patch (14 mg total) onto the skin daily. (may purchase from over the counter): For smoking cessation Patient not taking: No sig reported 10/09/17   Lindell Spar I, NP  oxyCODONE (OXY IR/ROXICODONE) 5 MG immediate release tablet Take 1 tablet (5 mg total) by mouth every 6 (six) hours as needed for breakthrough pain (in addition to baseline meds for post op pain control.). Patient not taking: Reported on 03/17/2021 03/06/21   Loni Dolly, PA-C  propranolol ER (INDERAL LA) 120 MG 24 hr capsule Take 1 capsule (120 mg total) by mouth daily. Patient not taking: Reported on 03/17/2021 12/11/20     propranolol ER (INDERAL LA) 120 MG 24 hr capsule Take 1 (one) capsule by mouth daily Patient not taking: Reported on 03/17/2021 01/21/21     traZODone (DESYREL) 150 MG tablet Take 1  tablet (150 mg total) by mouth daily. Patient not taking: Reported on 03/17/2021 12/28/20     traZODone (DESYREL) 50 MG tablet Take 1 tablet (50 mg total) by mouth at bedtime if needed. Patient not taking: No sig reported 12/10/20       Allergies    Zolpidem tartrate, Lyrica [pregabalin], and Demerol [meperidine]  Review of Systems   Review of Systems  Musculoskeletal:  Positive for gait problem. Negative for back pain.       Left hip pain  Skin:   Negative for rash and wound.  Neurological:  Positive for weakness. Negative for numbness.  All other systems reviewed and are negative.  Physical Exam Updated Vital Signs BP (!) 156/96   Pulse 86   Temp 97.8 F (36.6 C) (Oral)   Resp 16   SpO2 99%   Physical Exam Vitals and nursing note reviewed.  Constitutional:      Appearance: Normal appearance.     Comments: Patient is shaking and writhing in pain.  HENT:     Head: Normocephalic and atraumatic.  Eyes:     Conjunctiva/sclera: Conjunctivae normal.  Pulmonary:     Effort: Pulmonary effort is normal. No respiratory distress.  Musculoskeletal:     Comments: Left hip held in passive extension and position of comfort.  Pulses equal and normal in BLE.  Sensation intact in BLE.  Skin:    General: Skin is warm and dry.  Neurological:     Mental Status: He is alert.  Psychiatric:        Mood and Affect: Mood normal.        Behavior: Behavior normal.    ED Results / Procedures / Treatments   Labs (all labs ordered are listed, but only abnormal results are displayed) Labs Reviewed - No data to display  EKG None  Radiology DG Pelvis 1-2 Views  Result Date: 03/17/2021 CLINICAL DATA:  Left hip reduction EXAM: PELVIS - 1-2 VIEW COMPARISON:  Same day pelvis radiograph FINDINGS: AP view of the pelvis demonstrates interval reduction of left hip dislocation. No periprosthetic fracture is seen. Right hip arthroplasty appears intact and appropriately aligned. IMPRESSION: Successful reduction of left hip dislocation. Electronically Signed   By: Davina Poke D.O.   On: 03/17/2021 17:44   DG Hip Unilat W or Wo Pelvis 2-3 Views Left  Result Date: 03/17/2021 CLINICAL DATA:  Left hip pain EXAM: DG HIP (WITH OR WITHOUT PELVIS) 2-3V LEFT COMPARISON:  03/12/2021 FINDINGS: Postsurgical changes of bilateral total hip arthroplasties. Left femoral component is posteriorly dislocated relative to the acetabular component. No periprosthetic  fracture identified. Alignment of the right hip prosthesis appears intact. Pelvic bony ring intact. Spinal stimulator device is noted. IMPRESSION: Posterior left hip arthroplasty dislocation. Electronically Signed   By: Davina Poke D.O.   On: 03/17/2021 16:22    Procedures Procedures   Medications Ordered in ED Medications  oxyCODONE (Oxy IR/ROXICODONE) immediate release tablet 15 mg (has no administration in time range)  HYDROmorphone (DILAUDID) injection 1 mg (1 mg Intravenous Given 03/17/21 1536)  methocarbamol (ROBAXIN) tablet 500 mg (500 mg Oral Given 03/17/21 1621)  propofol (DIPRIVAN) 10 mg/mL bolus/IV push 100 mg (100 mg Intravenous Given 03/17/21 1719)  ketamine 50 mg in normal saline 5 mL (10 mg/mL) syringe (100 mg Intravenous Given 03/17/21 1720)    ED Course  I have reviewed the triage vital signs and the nursing notes.  Pertinent labs & imaging results that were available during my care of the patient  were reviewed by me and considered in my medical decision making (see chart for details).    MDM Rules/Calculators/A&P                            Patient is 55 year old male who presents with left hip pain.  Recent hip replacement on 10/4, with straining to have a bowel movement today and noticed a "pop" with associated severe pain.   XR shows posterior left hip arthroplasty dislocation. Patient sedated and dislocation reduced by attending physicians Dr. Zenia Resides and Dr. Johnney Killian. See their notes for procedure details.   Post reduction imaging shows successful reduction.   6:09pm Patient not requiring admission or inpatient treatment today. Plan to d/c to home with previously prescribed pain medication and muscle relaxers. Scheduled outpatient orthopedic follow up tomorrow. Given evening dose of pain medications and crutches. Discussed reasons to return to ED. Patient agreeable to plan.   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

## 2021-03-17 NOTE — Progress Notes (Signed)
Orthopedic Tech Progress Note Patient Details:  [name] 1966/04/24 [id number]  Ortho Devices Type of Ortho Device: Knee Immobilizer Ortho Device/Splint Location: hip reduction Ortho Device/Splint Interventions: Application   Post Interventions Patient Tolerated: Well Instructions Provided: Care of device  [name] 03/17/2021, 5:14 PM

## 2021-03-17 NOTE — ED Provider Notes (Signed)
I provided a substantive portion of the care of this patient.  I personally performed the entirety of the medical decision making for this encounter.      55 year old male presents with right hip dislocation after he was sitting on his toilet trying to move his bowels.  X-rays confirmed it.  He had hip replacement surgery just 12 days ago.  Will speak to his orthopedist   Lacretia Leigh, MD 03/17/21 1644

## 2021-03-17 NOTE — ED Notes (Signed)
Patient transported to X-ray 

## 2021-03-17 NOTE — Sedation Documentation (Signed)
Moderate Sedation began at 1658 Time out initiate Left Hip Close Reduction Consent signed Dr. Zenia Resides at bedside

## 2021-03-17 NOTE — ED Notes (Signed)
Procedure began at 1658 Time out by Dr. Vallery Ridge, Dr. Zenia Resides, [facility] RN, Respiratory Left Hip Closed Reduction Sedation 40 mg ketamine - 1703 30 mg propofol - 1704 30 mg propofol -1704 Left Hip Reduction Complete 1705

## 2021-03-17 NOTE — ED Provider Notes (Signed)
.  Sedation  Date/Time: 03/17/2021 5:10 PM Performed by: Charlesetta Shanks, MD Authorized by: Charlesetta Shanks, MD   Consent:    Consent obtained:  Verbal   Consent given by:  Patient   Risks discussed:  Allergic reaction, dysrhythmia, inadequate sedation, nausea, prolonged hypoxia resulting in organ damage, prolonged sedation necessitating reversal, respiratory compromise necessitating ventilatory assistance and intubation and vomiting Universal protocol:    Immediately prior to procedure, a time out was called: yes     Patient identity confirmed:  Arm band and verbally with patient Indications:    Procedure performed:  Dislocation reduction   Procedure necessitating sedation performed by:  Different physician Pre-sedation assessment:    Time since last food or drink:  Yesterday evening   ASA classification: class 3 - patient with severe systemic disease     Mouth opening:  3 or more finger widths   Thyromental distance:  3 finger widths   Mallampati score:  II - soft palate, uvula, fauces visible   Neck mobility: normal     Pre-sedation assessments completed and reviewed: airway patency, cardiovascular function, hydration status, mental status, nausea/vomiting, pain level, respiratory function and temperature     Pre-sedation assessment completed:  03/10/2021 4:50 PM Immediate pre-procedure details:    Reassessment: Patient reassessed immediately prior to procedure     Reviewed: vital signs, relevant labs/tests and NPO status     Verified: bag valve mask available, emergency equipment available, intubation equipment available, IV patency confirmed, oxygen available, reversal medications available and suction available   Procedure details (see MAR for exact dosages):    Preoxygenation:  Nasal cannula   Sedation:  Ketamine and propofol   Intended level of sedation: deep   Intra-procedure monitoring:  Blood pressure monitoring, cardiac monitor, continuous capnometry, continuous pulse  oximetry, frequent LOC assessments and frequent vital sign checks   Intra-procedure events: none     Total Provider sedation time (minutes):  15 Post-procedure details:    Post-sedation assessment completed:  03/17/2021 5:14 PM   Attendance: Constant attendance by certified staff until patient recovered     Recovery: Patient returned to pre-procedure baseline     Post-sedation assessments completed and reviewed: airway patency, cardiovascular function, hydration status, mental status, nausea/vomiting, pain level and respiratory function     Patient is stable for discharge or admission: yes     Procedure completion:  Tolerated well, no immediate complications    Charlesetta Shanks, MD 03/17/21 1714

## 2021-03-17 NOTE — ED Triage Notes (Signed)
Per EMS, patient from home, hip replacement on 10/4. Reports straining to have BM today and felt a pop. C/o pain to R hip.

## 2021-03-18 ENCOUNTER — Other Ambulatory Visit (HOSPITAL_COMMUNITY): Payer: Self-pay

## 2021-03-18 DIAGNOSIS — M8700 Idiopathic aseptic necrosis of unspecified bone: Secondary | ICD-10-CM | POA: Diagnosis not present

## 2021-03-18 DIAGNOSIS — Z79899 Other long term (current) drug therapy: Secondary | ICD-10-CM | POA: Diagnosis not present

## 2021-03-18 DIAGNOSIS — Z23 Encounter for immunization: Secondary | ICD-10-CM | POA: Diagnosis not present

## 2021-03-18 DIAGNOSIS — S73005S Unspecified dislocation of left hip, sequela: Secondary | ICD-10-CM | POA: Diagnosis not present

## 2021-03-18 DIAGNOSIS — M5030 Other cervical disc degeneration, unspecified cervical region: Secondary | ICD-10-CM | POA: Diagnosis not present

## 2021-03-18 MED ORDER — PROPRANOLOL HCL ER 60 MG PO CP24
ORAL_CAPSULE | ORAL | 3 refills | Status: DC
  Filled 2021-03-18: qty 30, 30d supply, fill #0
  Filled 2021-04-14: qty 30, 30d supply, fill #1
  Filled 2021-05-14: qty 30, 30d supply, fill #2
  Filled 2021-06-14: qty 30, 30d supply, fill #3

## 2021-03-18 MED ORDER — VITAMIN D 50 MCG (2000 UT) PO TABS: 2000.0000 [IU] | ORAL_TABLET | Freq: Every day | ORAL | 0 refills | Status: DC

## 2021-03-18 MED ORDER — OXYCODONE HCL 20 MG PO TABS
1.0000 | ORAL_TABLET | Freq: Four times a day (QID) | ORAL | 0 refills | Status: DC
  Filled 2021-03-18: qty 120, 30d supply, fill #0

## 2021-03-18 MED ORDER — ESOMEPRAZOLE MAGNESIUM 40 MG PO CPDR
DELAYED_RELEASE_CAPSULE | ORAL | 1 refills | Status: DC
  Filled 2021-03-18 – 2021-04-14 (×2): qty 30, 30d supply, fill #0
  Filled 2021-05-14: qty 30, 30d supply, fill #1

## 2021-03-19 DIAGNOSIS — E785 Hyperlipidemia, unspecified: Secondary | ICD-10-CM | POA: Diagnosis not present

## 2021-03-19 DIAGNOSIS — M5136 Other intervertebral disc degeneration, lumbar region: Secondary | ICD-10-CM | POA: Diagnosis not present

## 2021-03-19 DIAGNOSIS — Z96643 Presence of artificial hip joint, bilateral: Secondary | ICD-10-CM | POA: Diagnosis not present

## 2021-03-19 DIAGNOSIS — M4802 Spinal stenosis, cervical region: Secondary | ICD-10-CM | POA: Diagnosis not present

## 2021-03-19 DIAGNOSIS — Z471 Aftercare following joint replacement surgery: Secondary | ICD-10-CM | POA: Diagnosis not present

## 2021-03-19 DIAGNOSIS — M8580 Other specified disorders of bone density and structure, unspecified site: Secondary | ICD-10-CM | POA: Diagnosis not present

## 2021-03-19 DIAGNOSIS — G4700 Insomnia, unspecified: Secondary | ICD-10-CM | POA: Diagnosis not present

## 2021-03-19 DIAGNOSIS — G894 Chronic pain syndrome: Secondary | ICD-10-CM | POA: Diagnosis not present

## 2021-03-19 DIAGNOSIS — M797 Fibromyalgia: Secondary | ICD-10-CM | POA: Diagnosis not present

## 2021-03-20 DIAGNOSIS — M961 Postlaminectomy syndrome, not elsewhere classified: Secondary | ICD-10-CM | POA: Diagnosis not present

## 2021-03-20 DIAGNOSIS — G8929 Other chronic pain: Secondary | ICD-10-CM | POA: Diagnosis not present

## 2021-03-21 DIAGNOSIS — G894 Chronic pain syndrome: Secondary | ICD-10-CM | POA: Diagnosis not present

## 2021-03-21 DIAGNOSIS — M5136 Other intervertebral disc degeneration, lumbar region: Secondary | ICD-10-CM | POA: Diagnosis not present

## 2021-03-21 DIAGNOSIS — Z471 Aftercare following joint replacement surgery: Secondary | ICD-10-CM | POA: Diagnosis not present

## 2021-03-21 DIAGNOSIS — M8580 Other specified disorders of bone density and structure, unspecified site: Secondary | ICD-10-CM | POA: Diagnosis not present

## 2021-03-21 DIAGNOSIS — M4802 Spinal stenosis, cervical region: Secondary | ICD-10-CM | POA: Diagnosis not present

## 2021-03-21 DIAGNOSIS — E785 Hyperlipidemia, unspecified: Secondary | ICD-10-CM | POA: Diagnosis not present

## 2021-03-21 DIAGNOSIS — M797 Fibromyalgia: Secondary | ICD-10-CM | POA: Diagnosis not present

## 2021-03-21 DIAGNOSIS — Z96643 Presence of artificial hip joint, bilateral: Secondary | ICD-10-CM | POA: Diagnosis not present

## 2021-03-21 DIAGNOSIS — G4700 Insomnia, unspecified: Secondary | ICD-10-CM | POA: Diagnosis not present

## 2021-03-22 ENCOUNTER — Other Ambulatory Visit (HOSPITAL_COMMUNITY): Payer: Self-pay

## 2021-03-25 ENCOUNTER — Other Ambulatory Visit (HOSPITAL_COMMUNITY): Payer: Self-pay

## 2021-03-26 ENCOUNTER — Other Ambulatory Visit (HOSPITAL_COMMUNITY): Payer: Self-pay

## 2021-03-26 MED ORDER — VITAMIN D (ERGOCALCIFEROL) 1.25 MG (50000 UNIT) PO CAPS
ORAL_CAPSULE | ORAL | 1 refills | Status: DC
  Filled 2021-03-26: qty 4, 28d supply, fill #0
  Filled 2021-04-17 – 2021-05-01 (×2): qty 4, 28d supply, fill #1

## 2021-03-27 DIAGNOSIS — Z9889 Other specified postprocedural states: Secondary | ICD-10-CM | POA: Diagnosis not present

## 2021-03-29 ENCOUNTER — Other Ambulatory Visit (HOSPITAL_COMMUNITY): Payer: Self-pay

## 2021-04-01 ENCOUNTER — Other Ambulatory Visit (HOSPITAL_COMMUNITY): Payer: Self-pay

## 2021-04-08 DIAGNOSIS — I779 Disorder of arteries and arterioles, unspecified: Secondary | ICD-10-CM | POA: Diagnosis not present

## 2021-04-08 DIAGNOSIS — I739 Peripheral vascular disease, unspecified: Secondary | ICD-10-CM | POA: Diagnosis not present

## 2021-04-09 ENCOUNTER — Other Ambulatory Visit (HOSPITAL_COMMUNITY): Payer: Self-pay

## 2021-04-14 ENCOUNTER — Other Ambulatory Visit (HOSPITAL_COMMUNITY): Payer: Self-pay

## 2021-04-15 ENCOUNTER — Other Ambulatory Visit (HOSPITAL_COMMUNITY): Payer: Self-pay

## 2021-04-17 ENCOUNTER — Other Ambulatory Visit (HOSPITAL_COMMUNITY): Payer: Self-pay

## 2021-04-17 DIAGNOSIS — Z96642 Presence of left artificial hip joint: Secondary | ICD-10-CM | POA: Diagnosis not present

## 2021-04-17 DIAGNOSIS — R252 Cramp and spasm: Secondary | ICD-10-CM | POA: Diagnosis not present

## 2021-04-17 DIAGNOSIS — Z79899 Other long term (current) drug therapy: Secondary | ICD-10-CM | POA: Diagnosis not present

## 2021-04-17 DIAGNOSIS — M5030 Other cervical disc degeneration, unspecified cervical region: Secondary | ICD-10-CM | POA: Diagnosis not present

## 2021-04-17 DIAGNOSIS — M8700 Idiopathic aseptic necrosis of unspecified bone: Secondary | ICD-10-CM | POA: Diagnosis not present

## 2021-04-17 MED ORDER — REPATHA SURECLICK 140 MG/ML ~~LOC~~ SOAJ
SUBCUTANEOUS | 12 refills | Status: DC
  Filled 2021-04-17: qty 2, 28d supply, fill #0
  Filled 2021-05-27: qty 2, 28d supply, fill #1
  Filled 2021-06-20: qty 2, 28d supply, fill #2
  Filled 2021-06-21: qty 2, 30d supply, fill #2

## 2021-04-17 MED ORDER — NALOXONE HCL 4 MG/0.1ML NA LIQD
NASAL | 3 refills | Status: DC
  Filled 2021-04-17: qty 2, 30d supply, fill #0
  Filled 2022-01-31: qty 2, 30d supply, fill #1

## 2021-04-17 MED ORDER — OXYCODONE HCL 20 MG PO TABS
20.0000 mg | ORAL_TABLET | Freq: Four times a day (QID) | ORAL | 0 refills | Status: DC
  Filled 2021-04-17: qty 120, 30d supply, fill #0

## 2021-04-17 MED ORDER — METHOCARBAMOL 750 MG PO TABS
750.0000 mg | ORAL_TABLET | Freq: Four times a day (QID) | ORAL | 6 refills | Status: DC | PRN
  Filled 2021-04-17: qty 56, 14d supply, fill #0
  Filled 2021-05-01: qty 56, 14d supply, fill #1
  Filled 2021-05-14: qty 56, 14d supply, fill #2
  Filled 2021-05-27: qty 56, 14d supply, fill #3
  Filled 2021-06-13: qty 56, 14d supply, fill #4
  Filled 2021-06-23: qty 56, 14d supply, fill #5
  Filled 2021-07-09: qty 56, 14d supply, fill #6

## 2021-04-18 ENCOUNTER — Other Ambulatory Visit (HOSPITAL_COMMUNITY): Payer: Self-pay

## 2021-04-22 ENCOUNTER — Other Ambulatory Visit (HOSPITAL_COMMUNITY): Payer: Self-pay

## 2021-04-22 MED ORDER — LEVOTHYROXINE SODIUM 50 MCG PO TABS
50.0000 ug | ORAL_TABLET | Freq: Every day | ORAL | 1 refills | Status: DC
  Filled 2021-04-22 – 2021-05-01 (×2): qty 30, 30d supply, fill #0
  Filled 2021-05-24: qty 30, 30d supply, fill #1

## 2021-04-26 ENCOUNTER — Other Ambulatory Visit (HOSPITAL_COMMUNITY): Payer: Self-pay

## 2021-04-30 ENCOUNTER — Other Ambulatory Visit (HOSPITAL_COMMUNITY): Payer: Self-pay

## 2021-05-01 ENCOUNTER — Other Ambulatory Visit (HOSPITAL_COMMUNITY): Payer: Self-pay

## 2021-05-02 ENCOUNTER — Other Ambulatory Visit (HOSPITAL_COMMUNITY): Payer: Self-pay

## 2021-05-03 ENCOUNTER — Other Ambulatory Visit (HOSPITAL_COMMUNITY): Payer: Self-pay

## 2021-05-09 ENCOUNTER — Other Ambulatory Visit (HOSPITAL_COMMUNITY): Payer: Self-pay

## 2021-05-14 ENCOUNTER — Other Ambulatory Visit (HOSPITAL_COMMUNITY): Payer: Self-pay

## 2021-05-16 ENCOUNTER — Other Ambulatory Visit (HOSPITAL_COMMUNITY): Payer: Self-pay

## 2021-05-16 DIAGNOSIS — U071 COVID-19: Secondary | ICD-10-CM | POA: Diagnosis not present

## 2021-05-16 DIAGNOSIS — Z79899 Other long term (current) drug therapy: Secondary | ICD-10-CM | POA: Diagnosis not present

## 2021-05-16 DIAGNOSIS — G894 Chronic pain syndrome: Secondary | ICD-10-CM | POA: Diagnosis not present

## 2021-05-16 DIAGNOSIS — J111 Influenza due to unidentified influenza virus with other respiratory manifestations: Secondary | ICD-10-CM | POA: Diagnosis not present

## 2021-05-16 MED ORDER — VITAMIN C 1000 MG PO TABS: ORAL_TABLET | ORAL | 0 refills | Status: DC

## 2021-05-16 MED ORDER — AZITHROMYCIN 250 MG PO TABS
ORAL_TABLET | ORAL | 0 refills | Status: DC
  Filled 2021-05-16: qty 6, 5d supply, fill #0

## 2021-05-16 MED ORDER — OSELTAMIVIR PHOSPHATE 75 MG PO CAPS
ORAL_CAPSULE | ORAL | 0 refills | Status: DC
  Filled 2021-05-16: qty 10, 5d supply, fill #0

## 2021-05-16 MED ORDER — OXYCODONE HCL 20 MG PO TABS
1.0000 | ORAL_TABLET | Freq: Four times a day (QID) | ORAL | 0 refills | Status: DC
  Filled 2021-05-16: qty 120, 30d supply, fill #0

## 2021-05-16 MED ORDER — ZINC 50 MG PO TABS: ORAL_TABLET | ORAL | 0 refills | Status: DC

## 2021-05-16 MED ORDER — PREDNISONE 5 MG (21) PO TBPK
ORAL_TABLET | ORAL | 0 refills | Status: DC
  Filled 2021-05-16: qty 21, 6d supply, fill #0

## 2021-05-16 MED ORDER — VITAMIN D 50 MCG (2000 UT) PO TABS: ORAL_TABLET | ORAL | 0 refills | Status: DC

## 2021-05-23 ENCOUNTER — Other Ambulatory Visit (HOSPITAL_COMMUNITY): Payer: Self-pay

## 2021-05-23 DIAGNOSIS — M5412 Radiculopathy, cervical region: Secondary | ICD-10-CM | POA: Diagnosis not present

## 2021-05-23 DIAGNOSIS — F319 Bipolar disorder, unspecified: Secondary | ICD-10-CM | POA: Diagnosis not present

## 2021-05-23 DIAGNOSIS — M542 Cervicalgia: Secondary | ICD-10-CM | POA: Diagnosis not present

## 2021-05-23 MED ORDER — NICOTINE POLACRILEX 4 MG MT LOZG
LOZENGE | OROMUCOSAL | 0 refills | Status: DC
  Filled 2021-05-23: qty 72, 24d supply, fill #0

## 2021-05-23 MED ORDER — HYDROXYZINE HCL 25 MG PO TABS
25.0000 mg | ORAL_TABLET | Freq: Two times a day (BID) | ORAL | 3 refills | Status: DC
  Filled 2021-05-23 – 2021-06-13 (×2): qty 60, 30d supply, fill #0
  Filled 2021-07-09: qty 60, 30d supply, fill #1

## 2021-05-23 MED ORDER — BENZTROPINE MESYLATE 0.5 MG PO TABS
0.5000 mg | ORAL_TABLET | Freq: Two times a day (BID) | ORAL | 2 refills | Status: DC
  Filled 2021-05-23: qty 60, 30d supply, fill #0
  Filled 2021-06-26: qty 60, 30d supply, fill #1

## 2021-05-23 MED ORDER — DOXEPIN HCL 3 MG PO TABS
3.0000 mg | ORAL_TABLET | Freq: Every evening | ORAL | 1 refills | Status: DC
  Filled 2021-05-23 – 2021-06-06 (×2): qty 30, 30d supply, fill #0

## 2021-05-23 MED ORDER — LOXAPINE SUCCINATE 50 MG PO CAPS
50.0000 mg | ORAL_CAPSULE | Freq: Two times a day (BID) | ORAL | 2 refills | Status: DC
  Filled 2021-05-23: qty 60, 30d supply, fill #0
  Filled 2021-06-26: qty 60, 30d supply, fill #1

## 2021-05-23 MED ORDER — BUSPIRONE HCL 10 MG PO TABS
10.0000 mg | ORAL_TABLET | Freq: Three times a day (TID) | ORAL | 1 refills | Status: DC
  Filled 2021-05-23: qty 90, 30d supply, fill #0

## 2021-05-23 MED ORDER — LAMOTRIGINE 100 MG PO TABS
100.0000 mg | ORAL_TABLET | Freq: Two times a day (BID) | ORAL | 3 refills | Status: DC
  Filled 2021-05-23: qty 60, 30d supply, fill #0
  Filled 2021-06-26: qty 60, 30d supply, fill #1

## 2021-05-23 MED ORDER — NICOTINE 21 MG/24HR TD PT24
MEDICATED_PATCH | TRANSDERMAL | 0 refills | Status: DC
  Filled 2021-05-23: qty 28, 28d supply, fill #0

## 2021-05-24 ENCOUNTER — Other Ambulatory Visit (HOSPITAL_COMMUNITY): Payer: Self-pay

## 2021-05-27 ENCOUNTER — Other Ambulatory Visit (HOSPITAL_COMMUNITY): Payer: Self-pay

## 2021-05-28 ENCOUNTER — Other Ambulatory Visit (HOSPITAL_COMMUNITY): Payer: Self-pay

## 2021-05-28 MED ORDER — ERGOCALCIFEROL 1.25 MG (50000 UT) PO CAPS
ORAL_CAPSULE | ORAL | 1 refills | Status: DC
  Filled 2021-05-28: qty 4, 28d supply, fill #0
  Filled 2021-06-20: qty 4, 28d supply, fill #1

## 2021-05-29 ENCOUNTER — Other Ambulatory Visit (HOSPITAL_COMMUNITY): Payer: Self-pay

## 2021-05-30 ENCOUNTER — Other Ambulatory Visit (HOSPITAL_COMMUNITY): Payer: Self-pay

## 2021-06-06 ENCOUNTER — Other Ambulatory Visit (HOSPITAL_COMMUNITY): Payer: Self-pay

## 2021-06-06 MED ORDER — ESZOPICLONE 2 MG PO TABS
2.0000 mg | ORAL_TABLET | Freq: Every evening | ORAL | 1 refills | Status: DC
  Filled 2021-06-06: qty 30, 30d supply, fill #0

## 2021-06-13 ENCOUNTER — Other Ambulatory Visit (HOSPITAL_COMMUNITY): Payer: Self-pay

## 2021-06-13 MED ORDER — OXYCODONE HCL 20 MG PO TABS
1.0000 | ORAL_TABLET | Freq: Four times a day (QID) | ORAL | 0 refills | Status: DC
  Filled 2021-06-13: qty 120, 30d supply, fill #0

## 2021-06-14 ENCOUNTER — Other Ambulatory Visit (HOSPITAL_COMMUNITY): Payer: Self-pay

## 2021-06-20 ENCOUNTER — Other Ambulatory Visit (HOSPITAL_COMMUNITY): Payer: Self-pay

## 2021-06-20 MED ORDER — BUSPIRONE HCL 10 MG PO TABS
ORAL_TABLET | ORAL | 1 refills | Status: DC
  Filled 2021-06-20: qty 90, 30d supply, fill #0

## 2021-06-20 MED ORDER — ESZOPICLONE 3 MG PO TABS
ORAL_TABLET | ORAL | 1 refills | Status: DC
  Filled 2021-06-20: qty 30, 30d supply, fill #0

## 2021-06-21 ENCOUNTER — Other Ambulatory Visit (HOSPITAL_COMMUNITY): Payer: Self-pay

## 2021-06-22 ENCOUNTER — Other Ambulatory Visit (HOSPITAL_COMMUNITY): Payer: Self-pay

## 2021-06-24 ENCOUNTER — Other Ambulatory Visit (HOSPITAL_COMMUNITY): Payer: Self-pay

## 2021-06-26 ENCOUNTER — Other Ambulatory Visit (HOSPITAL_COMMUNITY): Payer: Self-pay

## 2021-06-26 MED ORDER — LEVOTHYROXINE SODIUM 50 MCG PO TABS
50.0000 ug | ORAL_TABLET | Freq: Every day | ORAL | 1 refills | Status: DC
  Filled 2021-06-26: qty 30, 30d supply, fill #0

## 2021-06-27 ENCOUNTER — Other Ambulatory Visit (HOSPITAL_COMMUNITY): Payer: Self-pay

## 2021-06-29 ENCOUNTER — Other Ambulatory Visit (HOSPITAL_COMMUNITY): Payer: Self-pay

## 2021-07-01 ENCOUNTER — Other Ambulatory Visit (HOSPITAL_COMMUNITY): Payer: Self-pay

## 2021-07-06 ENCOUNTER — Other Ambulatory Visit (HOSPITAL_COMMUNITY): Payer: Self-pay

## 2021-07-09 ENCOUNTER — Other Ambulatory Visit (HOSPITAL_COMMUNITY): Payer: Self-pay

## 2021-07-10 ENCOUNTER — Other Ambulatory Visit (HOSPITAL_COMMUNITY): Payer: Self-pay

## 2021-07-11 ENCOUNTER — Other Ambulatory Visit (HOSPITAL_COMMUNITY): Payer: Self-pay

## 2021-07-12 ENCOUNTER — Other Ambulatory Visit: Payer: Self-pay

## 2021-07-12 ENCOUNTER — Emergency Department (HOSPITAL_COMMUNITY)
Admission: EM | Admit: 2021-07-12 | Discharge: 2021-07-13 | Disposition: A | Discharge status: Other Acute Inpatient Hospital | Payer: Medicaid Other | Source: Home / Self Care | Attending: Emergency Medicine | Admitting: Emergency Medicine

## 2021-07-12 ENCOUNTER — Encounter (HOSPITAL_COMMUNITY): Payer: Self-pay

## 2021-07-12 ENCOUNTER — Other Ambulatory Visit (HOSPITAL_COMMUNITY): Payer: Self-pay

## 2021-07-12 DIAGNOSIS — R45851 Suicidal ideations: Secondary | ICD-10-CM

## 2021-07-12 DIAGNOSIS — Y900 Blood alcohol level of less than 20 mg/100 ml: Secondary | ICD-10-CM | POA: Insufficient documentation

## 2021-07-12 DIAGNOSIS — E039 Hypothyroidism, unspecified: Secondary | ICD-10-CM | POA: Insufficient documentation

## 2021-07-12 DIAGNOSIS — Z7982 Long term (current) use of aspirin: Secondary | ICD-10-CM | POA: Insufficient documentation

## 2021-07-12 DIAGNOSIS — D72829 Elevated white blood cell count, unspecified: Secondary | ICD-10-CM | POA: Insufficient documentation

## 2021-07-12 DIAGNOSIS — Z20822 Contact with and (suspected) exposure to covid-19: Secondary | ICD-10-CM | POA: Insufficient documentation

## 2021-07-12 DIAGNOSIS — F314 Bipolar disorder, current episode depressed, severe, without psychotic features: Secondary | ICD-10-CM | POA: Insufficient documentation

## 2021-07-12 DIAGNOSIS — I10 Essential (primary) hypertension: Secondary | ICD-10-CM | POA: Insufficient documentation

## 2021-07-12 LAB — SALICYLATE LEVEL: Salicylate Lvl: <7 mg/dL — ABNORMAL LOW (ref 7.0–30.0)

## 2021-07-12 LAB — CBC
HCT: 41 % (ref 39.0–52.0)
Hemoglobin: 13.9 g/dL (ref 13.0–17.0)
MCH: 27.6 pg (ref 26.0–34.0)
MCHC: 33.9 g/dL (ref 30.0–36.0)
MCV: 81.3 fL (ref 80.0–100.0)
Platelets: 479 10*3/uL — ABNORMAL HIGH (ref 150–400)
RBC: 5.04 MIL/uL (ref 4.22–5.81)
RDW: 15.4 % (ref 11.5–15.5)
WBC: 14.3 10*3/uL — ABNORMAL HIGH (ref 4.0–10.5)
nRBC: 0 % (ref 0.0–0.2)

## 2021-07-12 LAB — RAPID URINE DRUG SCREEN, HOSP PERFORMED
Amphetamines: NOT DETECTED
Barbiturates: NOT DETECTED
Benzodiazepines: NOT DETECTED
Cocaine: NOT DETECTED
Opiates: NOT DETECTED
Tetrahydrocannabinol: NOT DETECTED

## 2021-07-12 LAB — COMPREHENSIVE METABOLIC PANEL
ALT: 22 U/L (ref 0–44)
AST: 20 U/L (ref 15–41)
Albumin: 4.4 g/dL (ref 3.5–5.0)
Alkaline Phosphatase: 78 U/L (ref 38–126)
Anion gap: 11 (ref 5–15)
BUN: 17 mg/dL (ref 6–20)
CO2: 20 mmol/L — ABNORMAL LOW (ref 22–32)
Calcium: 9.5 mg/dL (ref 8.9–10.3)
Chloride: 106 mmol/L (ref 98–111)
Creatinine, Ser: 0.95 mg/dL (ref 0.61–1.24)
GFR, Estimated: >60 mL/min (ref >60)
Glucose, Bld: 103 mg/dL — ABNORMAL HIGH (ref 70–99)
Potassium: 3.7 mmol/L (ref 3.5–5.1)
Sodium: 137 mmol/L (ref 135–145)
Total Bilirubin: <0.1 mg/dL — ABNORMAL LOW (ref 0.3–1.2)
Total Protein: 7.6 g/dL (ref 6.5–8.1)

## 2021-07-12 LAB — ETHANOL: Alcohol, Ethyl (B): <10 mg/dL (ref <10)

## 2021-07-12 LAB — RESP PANEL BY RT-PCR (FLU A&B, COVID) ARPGX2
Influenza A by PCR: NEGATIVE
Influenza B by PCR: NEGATIVE
SARS Coronavirus 2 by RT PCR: NEGATIVE

## 2021-07-12 LAB — ACETAMINOPHEN LEVEL: Acetaminophen (Tylenol), Serum: <10 ug/mL — ABNORMAL LOW (ref 10–30)

## 2021-07-12 MED ORDER — OXYCODONE HCL 20 MG PO TABS
1.0000 | ORAL_TABLET | Freq: Four times a day (QID) | ORAL | 0 refills | Status: DC
Start: 2021-07-12 — End: 2021-08-08
  Filled 2021-07-12: qty 120, 30d supply, fill #0

## 2021-07-12 MED ORDER — OXYCODONE HCL 5 MG PO TABS
10.0000 mg | ORAL_TABLET | Freq: Once | ORAL | Status: AC
  Administered 2021-07-12: 10 mg via ORAL
  Filled 2021-07-12: qty 2

## 2021-07-12 MED ORDER — ESOMEPRAZOLE MAGNESIUM 40 MG PO CPDR
DELAYED_RELEASE_CAPSULE | ORAL | 1 refills | Status: DC
  Filled 2021-07-12: qty 30, 30d supply, fill #0

## 2021-07-12 NOTE — ED Notes (Signed)
Patient has 1 belonging bag located across from room 18. Patient still has his cane with him to help assist him with ambulation. Patient changed out into burgandy scrubs.

## 2021-07-12 NOTE — ED Notes (Signed)
TTS at bedside. 
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
## 2021-07-12 NOTE — ED Provider Notes (Addendum)
[facility] DEPT Provider Note   CSN: [id number] Arrival date & time: 07/12/21  2052     History  Chief Complaint  Patient presents with   Suicidal   Depression    [name] is a 56 y.o. male.  HPI Patient is a 56 year old male with a history of anxiety, depression, bipolar disorder, hypothyroidism, chronic pain syndrome, who presents to the emergency department due to suicidal ideation.  Patient states that he has become increasingly suicidal over the past 4 to 5 days.  He states that "they are trying to evict me, take my therapy cat, and cut my financial aid".  Reports chronic pain to his neck and hips but denies any recent falls or injuries.  States that is consistent with his previous chronic pain.  Denies any chest pain, shortness of breath, abdominal pain, nausea, vomiting.  States that he has attempted to kill himself in the past by "holding a gun to his head for about an hour" and also notes that he considered "jumping off his balcony".    Home Medications Prior to Admission medications   Medication Sig Start Date End Date Taking? Authorizing Provider  Ascorbic Acid (VITAMIN C) 1000 MG tablet Take 2 (two) Tablet by mouth daily 05/16/21     aspirin 81 MG chewable tablet Chew 1 tablet (81 mg total) by mouth 2 (two) times daily. 03/06/21   Loni Dolly, PA-C  azithromycin (ZITHROMAX Z-PAK) 250 MG tablet Take 2 tablets on day 1, then take 1 tablet daily for 4 additional days 05/16/21     benztropine (COGENTIN) 0.5 MG tablet Take 1 tablet (0.5 mg total) by mouth 2 (two) times daily. Patient not taking: Reported on 03/17/2021 12/28/20     benztropine (COGENTIN) 0.5 MG tablet Take 1 tablet by mouth 2 times daily. 02/21/21     benztropine (COGENTIN) 0.5 MG tablet Take 1 tablet by mouth 2 times daily. 05/23/21     busPIRone (BUSPAR) 10 MG tablet Take 1 tablet by mouth 3 times daily. 05/23/21     busPIRone (BUSPAR) 10 MG tablet Take 1 tablet by mouth 3  times every day 06/20/21     Cholecalciferol (VITAMIN D) 50 MCG (2000 UT) tablet Take 1 tablet by mouth daily 03/18/21     Cholecalciferol (VITAMIN D) 50 MCG (2000 UT) tablet Take 1 (one) Tablet by mouth daily 05/16/21     diclofenac Sodium (VOLTAREN) 1 % GEL Apply 2 g topically daily as needed (to affected areas for pain).    [provider]  divalproex (DEPAKOTE) 250 MG DR tablet Take 1 tablet (250 mg total) by mouth 3 (three) times daily. Patient not taking: No sig reported 12/28/20     divalproex (DEPAKOTE) 250 MG DR tablet Take 1 tablet (250 mg total) by mouth 3 (three) times daily for bipolar disorder. Patient not taking: No sig reported 11/01/20     ergocalciferol (VITAMIN D2) 1.25 MG (50000 UT) capsule Take 1 capsule by mouth once a week 05/28/21     esomeprazole (NEXIUM) 40 MG capsule Take 1 capsule (40 mg total) by mouth daily. Patient not taking: Reported on 03/17/2021 11/09/20     esomeprazole (NEXIUM) 40 MG capsule Take 1 capsule (40 mg total) by mouth daily. Patient not taking: No sig reported 02/11/21     esomeprazole (NEXIUM) 40 MG capsule Take 1 capsule by mouth once daily Patient taking differently: Take 40 mg by mouth daily before breakfast. 02/21/21     esomeprazole ([place])  40 MG capsule Take 1 capsule by mouth once a day. 07/12/21     Eszopiclone 3 MG TABS Take 1 tablet by mouth every day at bedtime 06/20/21     Evolocumab (REPATHA SURECLICK) 784 MG/ML SOAJ Inject 140mg  into the skin twice a month 04/17/21     hydrOXYzine (ATARAX) 25 MG tablet Take 1 tablet by mouth 3 times every day as needed. Patient taking differently: Take 25 mg by mouth 3 (three) times daily. 02/21/21     hydrOXYzine (ATARAX) 25 MG tablet Take 1 tablet by mouth 2 times daily as needed for anxiety 05/23/21     hydrOXYzine (VISTARIL) 25 MG capsule Take 1 capsule (25 mg total) by mouth every 8 (eight) hours as needed for anxiety. Patient not taking: Reported on 03/17/2021 11/01/20     lamoTRIgine  (LAMICTAL) 100 MG tablet Take 1 tablet (100 mg total) by mouth 2 (two) times daily. 12/28/20     lamoTRIgine (LAMICTAL) 100 MG tablet Take 1 tablet (100 mg total) by mouth 2 (two) times daily. Patient not taking: Reported on 03/17/2021 12/10/20     lamoTRIgine (LAMICTAL) 100 MG tablet Take 1 tablet by mouth twice daily. Patient not taking: Reported on 03/17/2021 02/21/21     lamoTRIgine (LAMICTAL) 100 MG tablet Take 1 tablet by mouth 2 times daily. 05/23/21     levothyroxine (SYNTHROID) 50 MCG tablet Take 1 tablet by mouth daily 06/26/21     loxapine (LOXITANE) 50 MG capsule Take 1 capsule (50 mg total) by mouth 2 (two) times daily. 12/28/20     loxapine (LOXITANE) 50 MG capsule Take 1 capsule (50 mg total) by mouth every 12 (twelve) hours. Patient not taking: Reported on 03/17/2021 12/10/20     loxapine (LOXITANE) 50 MG capsule Take one capsule by mouth 2 times daily. Patient not taking: Reported on 03/17/2021 02/21/21     loxapine (LOXITANE) 50 MG capsule Take 1 capsule by mouth 2 times daily. 05/23/21     methocarbamol (ROBAXIN) 500 MG tablet Take 1 tablet by mouth every six hours as needed for pain and spasm Patient taking differently: Take 500 mg by mouth every 6 (six) hours as needed for muscle spasms (and/or pain). 03/07/21     methocarbamol (ROBAXIN) 500 MG tablet Take 1 tablet by mouth every six hours as needed for pain and spasm Patient not taking: Reported on 03/17/2021 03/13/21     methocarbamol (ROBAXIN) 750 MG tablet Take 1 tablet (750 mg total) by mouth 4 (four) times daily as needed. 04/17/21     Multiple Vitamins-Minerals (MULTIVITAMIN WITH MINERALS) tablet Take 1 tablet by mouth daily.    [provider]  naloxone [facility]) nasal spray 4 mg/0.1 mL Use 1 spray in nose as directed 04/17/21     nicotine (NICODERM CQ - DOSED IN MG/24 HOURS) 14 mg/24hr patch Place 1 patch (14 mg total) onto the skin daily. (may purchase from over the counter): For smoking cessation Patient not  taking: No sig reported 10/09/17   Lindell Spar I, NP  nicotine (NICODERM CQ - DOSED IN MG/24 HOURS) 21 mg/24hr patch Place 21 mg onto the skin daily as needed (FOR SMOKING CESSATION WHEN HOSPITALIZED).    [provider]  nicotine (NICODERM CQ - DOSED IN MG/24 HOURS) 21 mg/24hr patch Use as directed 05/23/21   Edmon Crape, [facility]  nicotine polacrilex (COMMIT) 4 MG lozenge Take 4 mg by mouth as needed for smoking cessation.    [provider]  nicotine polacrilex (COMMIT)  4 MG lozenge Use as directed 05/23/21   Edmon Crape, [facility]  nitroGLYCERIN (NITROSTAT) 0.4 MG SL tablet Dissole 1 Tablet under the tongue every 5 minutes as needed for chest pain. MAX 3/day. Patient taking differently: Place 0.4 mg under the tongue every 5 (five) minutes as needed for chest pain (MAX OF 3 TABLETS IN A DAY). 01/21/21     Omega-3 Fatty Acids (FISH OIL) 1000 MG CAPS Take 2,000 mg by mouth 2 (two) times daily.    [provider]  ondansetron (ZOFRAN) 4 MG tablet Take 4 mg by mouth every 8 (eight) hours as needed for nausea or vomiting. 06/29/20   [provider]  oseltamivir (TAMIFLU) 75 MG capsule Take 1 capsule by mouth two times daily 05/16/21     oxyCODONE (OXY IR/ROXICODONE) 5 MG immediate release tablet Take 1 tablet (5 mg total) by mouth every 6 (six) hours as needed for breakthrough pain (in addition to baseline meds for post op pain control.). Patient not taking: Reported on 03/17/2021 03/06/21   Loni Dolly, PA-C  oxyCODONE (OXY IR/ROXICODONE) 5 MG immediate release tablet Take 1 tablet by mouth every six hours as needed in addition to baseline meds for post op pain Patient taking differently: Take 5 mg by mouth 4 (four) times daily. 03/13/21     oxyCODONE (ROXICODONE) 15 MG immediate release tablet Take 1 tablet by mouth four times daily 02/21/21     Oxycodone HCl 20 MG TABS Take 1 Tablet by mouth four times daily 07/12/21     predniSONE (STERAPRED UNI-PAK 21 TAB) 5 MG (21)  TBPK tablet Take as directed per package instructions 05/16/21     propranolol ER (INDERAL LA) 120 MG 24 hr capsule Take 1 capsule (120 mg total) by mouth daily. Patient not taking: Reported on 03/17/2021 12/11/20     propranolol ER (INDERAL LA) 120 MG 24 hr capsule Take 1 (one) capsule by mouth daily Patient not taking: Reported on 03/17/2021 01/21/21     propranolol ER (INDERAL LA) 60 MG 24 hr capsule Take 1 (one) capsule by mouth daily Patient taking differently: Take 60 mg by mouth in the morning. 02/21/21     propranolol ER (INDERAL LA) 60 MG 24 hr capsule Take 1 capsule by mouth daily 03/18/21     traZODone (DESYREL) 150 MG tablet Take 1 tablet (150 mg total) by mouth daily. Patient not taking: Reported on 03/17/2021 12/28/20     traZODone (DESYREL) 150 MG tablet Take one tablet by mouth every day (at bedtime.) 02/21/21     traZODone (DESYREL) 50 MG tablet Take 1 tablet (50 mg total) by mouth at bedtime if needed. Patient not taking: No sig reported 12/10/20     Vitamin D, Ergocalciferol, (DRISDOL) 50000 units CAPS capsule Take 1 capsule (50,000 Units total) by mouth every Thursday. For calcium supplementation Patient taking differently: Take 50,000 Units by mouth every Thursday. 10/15/17   Lindell Spar I, NP  Zinc 50 MG TABS Take 1 (one) Tablet by mouth daily 05/16/21     Doxepin HCl 3 MG TABS Take 1 tablet by mouth every evening 30 minutes before bedtime on an empty stomach 05/23/21 06/06/21        Allergies    Zolpidem tartrate, Lyrica [pregabalin], and Demerol [meperidine]    Review of Systems   Review of Systems  All other systems reviewed and are negative. Ten systems reviewed and are negative for acute change, except as noted in the HPI.   Physical Exam  Updated Vital Signs BP (!) 144/93 (BP Location: Right Arm)    Pulse 96    Temp 98.1 F (36.7 C) (Oral)    Resp 16    Ht 6' (1.829 m)    Wt 104.3 kg    SpO2 100%    BMI 31.19 kg/m  Physical Exam Vitals and nursing note reviewed.   Constitutional:      General: He is not in acute distress.    Appearance: Normal appearance. He is not ill-appearing, toxic-appearing or diaphoretic.  HENT:     Head: Normocephalic and atraumatic.     Right Ear: External ear normal.     Left Ear: External ear normal.     Nose: Nose normal.     Mouth/Throat:     Mouth: Mucous membranes are moist.     Pharynx: Oropharynx is clear. No oropharyngeal exudate or posterior oropharyngeal erythema.  Eyes:     Extraocular Movements: Extraocular movements intact.  Cardiovascular:     Rate and Rhythm: Normal rate and regular rhythm.     Pulses: Normal pulses.     Heart sounds: Normal heart sounds. No murmur heard.   No friction rub. No gallop.  Pulmonary:     Effort: Pulmonary effort is normal. No respiratory distress.     Breath sounds: Normal breath sounds. No stridor. No wheezing, rhonchi or rales.  Abdominal:     General: Abdomen is flat.     Palpations: Abdomen is soft.     Tenderness: There is no abdominal tenderness.     Comments: Abdomen is soft and nontender.  Musculoskeletal:        General: Normal range of motion.     Cervical back: Normal range of motion and neck supple. No tenderness.  Skin:    General: Skin is warm and dry.  Neurological:     General: No focal deficit present.     Mental Status: He is alert and oriented to person, place, and time.  Psychiatric:        Attention and Perception: Attention normal.        Mood and Affect: Mood is anxious.        Behavior: Behavior normal. Behavior is not agitated or aggressive.        Thought Content: Thought content includes suicidal ideation.   ED Results / Procedures / Treatments   Labs (all labs ordered are listed, but only abnormal results are displayed) Labs Reviewed  COMPREHENSIVE METABOLIC PANEL - Abnormal; Notable for the following components:      Result Value   CO2 20 (*)    Glucose, Bld 103 (*)    Total Bilirubin <0.1 (*)    All other components within  normal limits  SALICYLATE LEVEL - Abnormal; Notable for the following components:   Salicylate Lvl <2.0 (*)    All other components within normal limits  ACETAMINOPHEN LEVEL - Abnormal; Notable for the following components:   Acetaminophen (Tylenol), Serum <10 (*)    All other components within normal limits  CBC - Abnormal; Notable for the following components:   WBC 14.3 (*)    Platelets 479 (*)    All other components within normal limits  RESP PANEL BY RT-PCR (FLU A&B, COVID) ARPGX2  ETHANOL  RAPID URINE DRUG SCREEN, HOSP PERFORMED    EKG None  Radiology No results found.  Procedures Procedures    Medications Ordered in ED Medications  benztropine (COGENTIN) tablet 0.5 mg (0.5 mg Oral Given 07/13/21 0115)  busPIRone (  BUSPAR) tablet 10 mg (has no administration in time range)  pantoprazole (PROTONIX) EC tablet 40 mg (has no administration in time range)  hydrOXYzine (ATARAX) tablet 25 mg (25 mg Oral Given 07/13/21 0115)  lamoTRIgine (LAMICTAL) tablet 100 mg (100 mg Oral Given 07/13/21 0115)  oxyCODONE (Oxy IR/ROXICODONE) immediate release tablet 20 mg (20 mg Oral Given 07/13/21 0355)  propranolol ER (INDERAL LA) 24 hr capsule 60 mg (60 mg Oral Given 07/13/21 0540)  traZODone (DESYREL) tablet 75 mg (75 mg Oral Given 07/13/21 0115)  levothyroxine (SYNTHROID) tablet 50 mcg (50 mcg Oral Given 07/13/21 0540)  loxapine (LOXITANE) capsule 50 mg (50 mg Oral Given 07/13/21 0129)  oxyCODONE (Oxy IR/ROXICODONE) immediate release tablet 10 mg (10 mg Oral Given 07/12/21 2306)  oxyCODONE (Oxy IR/ROXICODONE) immediate release tablet 10 mg (10 mg Oral Given 07/13/21 0206)  amLODipine (NORVASC) tablet 5 mg (5 mg Oral Given 07/13/21 0241)  lisinopril (ZESTRIL) tablet 10 mg (10 mg Oral Given 07/13/21 0438)  methocarbamol (ROBAXIN) tablet 750 mg (750 mg Oral Given 07/13/21 0557)    ED Course/ Medical Decision Making/ A&P                           Medical Decision Making Amount and/or Complexity of  Data Reviewed Labs: ordered.  Risk Prescription drug management.  Pt is a 56 y.o. male with history of anxiety, depression, bipolar disorder who presents to the emergency department due to suicidal ideation.  Labs: CBC with a white count of 14.3 and platelets of 479. Ethanol less than 10. Salicylate less than 7. Acetaminophen less than 10. UDS negative. Respiratory panel is negative. CMP with a CO2 of 20, glucose of 103, total bilirubin less than 0.1.  I, Rayna Sexton, PA-C, personally reviewed and evaluated these images and lab results as part of my medical decision-making.  Patient endorses suicidal ideation for the past 4 to 5 days.  Reports multiple suicide attempts in the past.  Physical exam otherwise reassuring.  Heart is regular rate and rhythm without murmurs, rubs, or gallops.  Lungs are clear to auscultation bilaterally.  Abdomen is soft and nontender.  Does not appear to be responding to internal stimuli.  Appropriate and pleasant to converse with.  Lab work appears generally reassuring.  Abnormalities as noted above.  Leukocytosis 14.3.  Does not appear to be consistent with an infectious etiology.  Patient afebrile not tachycardic.  Nontoxic-appearing.  TTS evaluated the patient and recommended inpatient admission.  They state that his diastolic BP needs to be less than 100 before he can be transferred. Patient notes that he gets anxious in hospitals and typically is hypertensive in medical settings.  He was given a dose of amlodipine as well as lisinopril.  Patient also given his morning propanolol.  Repeat blood pressure of 144/93.  No chest pain, shortness of breath, visual changes, headache.  Normal renal function on CMP.  A&O x3.  No evidence of endorgan damage or hypertensive encephalopathy.  Patient appears medically cleared.  Patient will be transferred for inpatient psychiatric care at this time.  Note: Portions of this report may have been transcribed using voice  recognition software. Every effort was made to ensure accuracy; however, inadvertent computerized transcription errors may be present.   Final Clinical Impression(s) / ED Diagnoses Final diagnoses:  Suicidal ideation  Hypertension, unspecified type   Rx / DC Orders ED Discharge Orders     None  Rayna Sexton, PA-C 07/12/21 2349    Rayna Sexton, PA-C 07/13/21 8099    Lacretia Leigh, MD 07/16/21 2202

## 2021-07-12 NOTE — ED Triage Notes (Signed)
Patient presents to ED with c/o of depression with suicidal ideation x 4 days. Pt reports multiple financial stressors and possible eviction.

## 2021-07-13 ENCOUNTER — Encounter (HOSPITAL_COMMUNITY): Payer: Self-pay | Admitting: Physician Assistant

## 2021-07-13 ENCOUNTER — Inpatient Hospital Stay (HOSPITAL_COMMUNITY)
Admission: AD | Admit: 2021-07-13 | Discharge: 2021-07-16 | DRG: 885 | Disposition: A | Discharge status: Home / Self Care | Payer: Medicaid Other | Source: Intra-hospital | Attending: Psychiatry | Admitting: Psychiatry

## 2021-07-13 ENCOUNTER — Other Ambulatory Visit (HOSPITAL_COMMUNITY): Payer: Self-pay

## 2021-07-13 DIAGNOSIS — Z7982 Long term (current) use of aspirin: Secondary | ICD-10-CM | POA: Diagnosis not present

## 2021-07-13 DIAGNOSIS — Z20822 Contact with and (suspected) exposure to covid-19: Secondary | ICD-10-CM | POA: Diagnosis present

## 2021-07-13 DIAGNOSIS — M797 Fibromyalgia: Secondary | ICD-10-CM | POA: Diagnosis present

## 2021-07-13 DIAGNOSIS — R45851 Suicidal ideations: Secondary | ICD-10-CM | POA: Diagnosis present

## 2021-07-13 DIAGNOSIS — Z79899 Other long term (current) drug therapy: Secondary | ICD-10-CM

## 2021-07-13 DIAGNOSIS — Z79890 Hormone replacement therapy (postmenopausal): Secondary | ICD-10-CM

## 2021-07-13 DIAGNOSIS — Z9151 Personal history of suicidal behavior: Secondary | ICD-10-CM

## 2021-07-13 DIAGNOSIS — Z888 Allergy status to other drugs, medicaments and biological substances status: Secondary | ICD-10-CM

## 2021-07-13 DIAGNOSIS — F17210 Nicotine dependence, cigarettes, uncomplicated: Secondary | ICD-10-CM | POA: Diagnosis present

## 2021-07-13 DIAGNOSIS — K219 Gastro-esophageal reflux disease without esophagitis: Secondary | ICD-10-CM | POA: Diagnosis present

## 2021-07-13 DIAGNOSIS — Z981 Arthrodesis status: Secondary | ICD-10-CM

## 2021-07-13 DIAGNOSIS — E785 Hyperlipidemia, unspecified: Secondary | ICD-10-CM | POA: Diagnosis present

## 2021-07-13 DIAGNOSIS — M8580 Other specified disorders of bone density and structure, unspecified site: Secondary | ICD-10-CM | POA: Diagnosis present

## 2021-07-13 DIAGNOSIS — E039 Hypothyroidism, unspecified: Secondary | ICD-10-CM | POA: Diagnosis present

## 2021-07-13 DIAGNOSIS — Z9682 Presence of neurostimulator: Secondary | ICD-10-CM

## 2021-07-13 DIAGNOSIS — Z62810 Personal history of physical and sexual abuse in childhood: Secondary | ICD-10-CM | POA: Diagnosis present

## 2021-07-13 DIAGNOSIS — Z635 Disruption of family by separation and divorce: Secondary | ICD-10-CM | POA: Diagnosis not present

## 2021-07-13 DIAGNOSIS — Z885 Allergy status to narcotic agent status: Secondary | ICD-10-CM

## 2021-07-13 DIAGNOSIS — G4700 Insomnia, unspecified: Secondary | ICD-10-CM | POA: Diagnosis present

## 2021-07-13 DIAGNOSIS — Z96643 Presence of artificial hip joint, bilateral: Secondary | ICD-10-CM | POA: Diagnosis present

## 2021-07-13 DIAGNOSIS — I2510 Atherosclerotic heart disease of native coronary artery without angina pectoris: Secondary | ICD-10-CM | POA: Diagnosis present

## 2021-07-13 DIAGNOSIS — Z62811 Personal history of psychological abuse in childhood: Secondary | ICD-10-CM | POA: Diagnosis present

## 2021-07-13 DIAGNOSIS — R251 Tremor, unspecified: Secondary | ICD-10-CM | POA: Diagnosis not present

## 2021-07-13 DIAGNOSIS — F3110 Bipolar disorder, current episode manic without psychotic features, unspecified: Secondary | ICD-10-CM | POA: Diagnosis present

## 2021-07-13 DIAGNOSIS — F3130 Bipolar disorder, current episode depressed, mild or moderate severity, unspecified: Secondary | ICD-10-CM | POA: Diagnosis present

## 2021-07-13 DIAGNOSIS — F319 Bipolar disorder, unspecified: Secondary | ICD-10-CM | POA: Diagnosis present

## 2021-07-13 MED ORDER — LAMOTRIGINE 100 MG PO TABS
100.0000 mg | ORAL_TABLET | Freq: Two times a day (BID) | ORAL | Status: DC
  Administered 2021-07-13 (×2): 100 mg via ORAL
  Filled 2021-07-13 (×2): qty 1

## 2021-07-13 MED ORDER — LISINOPRIL 10 MG PO TABS
10.0000 mg | ORAL_TABLET | Freq: Once | ORAL | Status: AC
  Administered 2021-07-13: 10 mg via ORAL
  Filled 2021-07-13: qty 1

## 2021-07-13 MED ORDER — OXYCODONE HCL 20 MG PO TABS: 1.0000 | ORAL_TABLET | Freq: Four times a day (QID) | ORAL | Status: DC

## 2021-07-13 MED ORDER — ACETAMINOPHEN 325 MG PO TABS: 650.0000 mg | ORAL_TABLET | Freq: Four times a day (QID) | ORAL | Status: DC | PRN

## 2021-07-13 MED ORDER — BENZTROPINE MESYLATE 0.5 MG PO TABS
0.5000 mg | ORAL_TABLET | Freq: Two times a day (BID) | ORAL | Status: DC
  Administered 2021-07-13: 0.5 mg via ORAL
  Filled 2021-07-13: qty 1

## 2021-07-13 MED ORDER — PROPRANOLOL HCL ER 60 MG PO CP24
60.0000 mg | ORAL_CAPSULE | Freq: Every day | ORAL | Status: DC
  Administered 2021-07-13: 60 mg via ORAL
  Filled 2021-07-13: qty 1

## 2021-07-13 MED ORDER — TRAZODONE HCL 50 MG PO TABS
75.0000 mg | ORAL_TABLET | Freq: Every day | ORAL | Status: DC
  Administered 2021-07-13: 75 mg via ORAL
  Filled 2021-07-13: qty 2

## 2021-07-13 MED ORDER — PANTOPRAZOLE SODIUM 40 MG PO TBEC
40.0000 mg | DELAYED_RELEASE_TABLET | Freq: Every day | ORAL | Status: DC
  Administered 2021-07-13: 40 mg via ORAL
  Filled 2021-07-13: qty 1

## 2021-07-13 MED ORDER — LOXAPINE SUCCINATE 25 MG PO CAPS
50.0000 mg | ORAL_CAPSULE | Freq: Two times a day (BID) | ORAL | Status: DC
  Administered 2021-07-13: 50 mg via ORAL
  Filled 2021-07-13 (×2): qty 2

## 2021-07-13 MED ORDER — METHOCARBAMOL 500 MG PO TABS
750.0000 mg | ORAL_TABLET | Freq: Once | ORAL | Status: AC
  Administered 2021-07-13: 750 mg via ORAL
  Filled 2021-07-13: qty 2

## 2021-07-13 MED ORDER — NICOTINE 21 MG/24HR TD PT24
21.0000 mg | MEDICATED_PATCH | Freq: Every day | TRANSDERMAL | Status: DC
  Administered 2021-07-13: 21 mg via TRANSDERMAL
  Filled 2021-07-13: qty 1

## 2021-07-13 MED ORDER — LEVOTHYROXINE SODIUM 50 MCG PO TABS
50.0000 ug | ORAL_TABLET | Freq: Every day | ORAL | Status: DC
  Administered 2021-07-13: 50 ug via ORAL
  Filled 2021-07-13: qty 1

## 2021-07-13 MED ORDER — METHOCARBAMOL 500 MG PO TABS
500.0000 mg | ORAL_TABLET | Freq: Two times a day (BID) | ORAL | Status: DC
  Administered 2021-07-13 – 2021-07-15 (×4): 500 mg via ORAL
  Filled 2021-07-13 (×10): qty 1

## 2021-07-13 MED ORDER — HYDROXYZINE HCL 25 MG PO TABS
25.0000 mg | ORAL_TABLET | Freq: Three times a day (TID) | ORAL | Status: DC | PRN
  Administered 2021-07-13 – 2021-07-15 (×3): 25 mg via ORAL
  Filled 2021-07-13 (×3): qty 1

## 2021-07-13 MED ORDER — ALUM & MAG HYDROXIDE-SIMETH 200-200-20 MG/5ML PO SUSP: 30.0000 mL | ORAL | Status: DC | PRN

## 2021-07-13 MED ORDER — TRAZODONE HCL 50 MG PO TABS
50.0000 mg | ORAL_TABLET | Freq: Every evening | ORAL | Status: DC | PRN
  Administered 2021-07-13: 50 mg via ORAL
  Filled 2021-07-13: qty 1

## 2021-07-13 MED ORDER — LEVOTHYROXINE SODIUM 50 MCG PO TABS
50.0000 ug | ORAL_TABLET | Freq: Every day | ORAL | Status: DC
  Administered 2021-07-14: 50 ug via ORAL
  Filled 2021-07-13 (×5): qty 1

## 2021-07-13 MED ORDER — MAGNESIUM HYDROXIDE 400 MG/5ML PO SUSP: 30.0000 mL | Freq: Every day | ORAL | Status: DC | PRN

## 2021-07-13 MED ORDER — QUETIAPINE FUMARATE ER 50 MG PO TB24
100.0000 mg | ORAL_TABLET | Freq: Every day | ORAL | Status: DC
  Administered 2021-07-13 – 2021-07-14 (×2): 100 mg via ORAL
  Filled 2021-07-13 (×6): qty 2

## 2021-07-13 MED ORDER — PANTOPRAZOLE SODIUM 40 MG PO TBEC
40.0000 mg | DELAYED_RELEASE_TABLET | Freq: Every day | ORAL | Status: DC
  Administered 2021-07-14 – 2021-07-16 (×3): 40 mg via ORAL
  Filled 2021-07-13 (×7): qty 1

## 2021-07-13 MED ORDER — HYDROXYZINE HCL 25 MG PO TABS
25.0000 mg | ORAL_TABLET | Freq: Two times a day (BID) | ORAL | Status: DC | PRN
  Administered 2021-07-13 (×2): 25 mg via ORAL
  Filled 2021-07-13 (×2): qty 1

## 2021-07-13 MED ORDER — OXYCODONE HCL 5 MG PO TABS
20.0000 mg | ORAL_TABLET | Freq: Four times a day (QID) | ORAL | Status: DC
  Administered 2021-07-13 (×2): 20 mg via ORAL
  Filled 2021-07-13 (×2): qty 4

## 2021-07-13 MED ORDER — AMLODIPINE BESYLATE 5 MG PO TABS
5.0000 mg | ORAL_TABLET | Freq: Once | ORAL | Status: AC
  Administered 2021-07-13: 5 mg via ORAL
  Filled 2021-07-13: qty 1

## 2021-07-13 MED ORDER — BUSPIRONE HCL 5 MG PO TABS
10.0000 mg | ORAL_TABLET | Freq: Three times a day (TID) | ORAL | Status: DC
  Administered 2021-07-13 – 2021-07-16 (×9): 10 mg via ORAL
  Filled 2021-07-13 (×9): qty 2
  Filled 2021-07-13: qty 1
  Filled 2021-07-13 (×4): qty 2
  Filled 2021-07-13: qty 1
  Filled 2021-07-13 (×2): qty 2

## 2021-07-13 MED ORDER — BUSPIRONE HCL 10 MG PO TABS
10.0000 mg | ORAL_TABLET | Freq: Three times a day (TID) | ORAL | Status: DC
  Administered 2021-07-13: 10 mg via ORAL
  Filled 2021-07-13: qty 1

## 2021-07-13 MED ORDER — NICOTINE 21 MG/24HR TD PT24
MEDICATED_PATCH | TRANSDERMAL | Status: AC
  Filled 2021-07-13: qty 1

## 2021-07-13 MED ORDER — LEVOTHYROXINE SODIUM 50 MCG PO TABS: 50.0000 ug | ORAL_TABLET | Freq: Every day | ORAL | Status: DC

## 2021-07-13 MED ORDER — BENZTROPINE MESYLATE 0.5 MG PO TABS
0.5000 mg | ORAL_TABLET | Freq: Two times a day (BID) | ORAL | Status: DC
  Administered 2021-07-13 – 2021-07-14 (×2): 0.5 mg via ORAL
  Filled 2021-07-13 (×8): qty 1

## 2021-07-13 MED ORDER — PROPRANOLOL HCL ER 60 MG PO CP24
60.0000 mg | ORAL_CAPSULE | Freq: Every day | ORAL | Status: DC
  Administered 2021-07-14 – 2021-07-16 (×3): 60 mg via ORAL
  Filled 2021-07-13 (×7): qty 1

## 2021-07-13 MED ORDER — LOXAPINE SUCCINATE 5 MG PO CAPS
50.0000 mg | ORAL_CAPSULE | Freq: Two times a day (BID) | ORAL | Status: DC
  Filled 2021-07-13: qty 10

## 2021-07-13 MED ORDER — OXYCODONE HCL 5 MG PO TABS
20.0000 mg | ORAL_TABLET | Freq: Four times a day (QID) | ORAL | Status: DC
  Administered 2021-07-13 – 2021-07-16 (×12): 20 mg via ORAL
  Filled 2021-07-13 (×12): qty 4

## 2021-07-13 MED ORDER — OXYCODONE HCL 5 MG PO TABS
10.0000 mg | ORAL_TABLET | Freq: Once | ORAL | Status: AC
  Administered 2021-07-13: 10 mg via ORAL
  Filled 2021-07-13: qty 2

## 2021-07-13 NOTE — Tx Team (Signed)
Initial Treatment Plan 07/13/2021 8:21 PM [name] [id number]    PATIENT STRESSORS: Financial difficulties   Health problems     PATIENT STRENGTHS: Ability for insight  Communication skills    PATIENT IDENTIFIED PROBLEMS: Suicidal ideation  Depression  Anxiety  Housing/financial issues  Insomnia             DISCHARGE CRITERIA:  Adequate post-discharge living arrangements Improved stabilization in mood, thinking, and/or behavior Reduction of life-threatening or endangering symptoms to within safe limits  PRELIMINARY DISCHARGE PLAN: Return to previous living arrangement  PATIENT/FAMILY INVOLVEMENT: This treatment plan has been presented to and reviewed with the patient, [name].  The patient has been given the opportunity to ask questions and make suggestions.  Luna Glasgow, RN 07/13/2021, 8:21 PM

## 2021-07-13 NOTE — Progress Notes (Signed)
Adult Psychoeducational Group Note  Date:  07/13/2021 Time:  10:46 PM  Group Topic/Focus:  Wrap-Up Group:   The focus of this group is to help patients review their daily goal of treatment and discuss progress on daily workbooks.  Participation Level:  Active  Participation Quality:  Appropriate  Affect:  Appropriate  Cognitive:  Appropriate  Insight: Appropriate  Engagement in Group:  Engaged  Modes of Intervention:  Discussion and Education  Additional Comments:  Patent said staff needs to pay attention. The dayroom nees to always be open for patient usage Barbette Hair 07/13/2021, 10:46 PM

## 2021-07-13 NOTE — ED Notes (Signed)
Trinna Post, PA, reviewed pt's chart and information and determined pt meets inpatient criteria.

## 2021-07-13 NOTE — Progress Notes (Signed)
Pt is a 56 y.o. male admitted at [facility] with SI with plan to hang self in the bathtub using the grab bar.  Pt is in chronic pain and takes Oxycodone 20mg IR q 4 hours at home.  Pt said he has plates in his neck and lower leg from accidents he sustained at work in the [employer] and falling off a truck, respectively.  Pt is legally separated from wife who is a Marine scientist and manages pt's medications.  Daughter lives in [place] who is an occupational therapist. Pt said he has been separated from his wife since 2011.  Pt said triggers are financial stressors, possible eviction from his apartment because he is not following the rules having two therapy cats when he is only allowed one pet in the apartment.  Pt  is currently having  SI during [facility] admission (he was transferred to [facility] from [facility] ED) but he contracts for safety. Pt signed paperwork, verbalized understanding and is in agreement with plan of care.

## 2021-07-13 NOTE — ED Provider Notes (Signed)
Emergency Medicine Observation Re-evaluation Note  [name] is a 56 y.o. male, seen on rounds today.  Pt initially presented to the ED for complaints of Suicidal and Depression Currently, the patient is awake and calm.  Physical Exam  BP (!) 144/93 (BP Location: Right Arm)    Pulse 96    Temp 98.1 F (36.7 C) (Oral)    Resp 16    Ht 6' (1.829 m)    Wt 104.3 kg    SpO2 100%    BMI 31.19 kg/m  Physical Exam General: alert Cardiac: rrr Lungs: cta b Psych: calm  ED Course / MDM  EKG:   I have reviewed the labs performed to date as well as medications administered while in observation.  Recent changes in the last 24 hours include pt has been accepted to [facility]  Plan  Current plan is for [facility] this am.  [name] is not under involuntary commitment.     Isla Pence, MD 07/13/21 [phone]

## 2021-07-13 NOTE — BH Assessment (Signed)
Pt has been accepted at [facility]. His arrival time should be coordinated with the daytime AC, who should be contacted at 0800 at [phone].  Room: 403-1 Accepting: Trinna Post, PA Attending: Dr. Caswell Corwin Call to Report: [phone]  This information was relayed to pt's team at [phone].

## 2021-07-13 NOTE — ED Notes (Signed)
Safe transport called 

## 2021-07-13 NOTE — ED Notes (Signed)
Have not heard back from safe transport. Called and left another voicemail requesting a call back with ETA.

## 2021-07-13 NOTE — ED Notes (Signed)
Ambulatory with tech to safe transport.

## 2021-07-14 LAB — HEMOGLOBIN A1C
Hgb A1c MFr Bld: 5.1 % (ref 4.8–5.6)
Mean Plasma Glucose: 99.67 mg/dL

## 2021-07-14 LAB — TSH: TSH: 1.944 u[IU]/mL (ref 0.350–4.500)

## 2021-07-14 MED ORDER — TRAZODONE HCL 50 MG PO TABS
50.0000 mg | ORAL_TABLET | Freq: Every day | ORAL | Status: DC
  Administered 2021-07-14 – 2021-07-15 (×2): 50 mg via ORAL
  Filled 2021-07-14 (×5): qty 1

## 2021-07-14 MED ORDER — BENZTROPINE MESYLATE 1 MG PO TABS
1.0000 mg | ORAL_TABLET | Freq: Two times a day (BID) | ORAL | Status: DC
Start: 2021-07-14 — End: 2021-07-14
  Filled 2021-07-14 (×2): qty 1

## 2021-07-14 MED ORDER — NICOTINE POLACRILEX 4 MG MT LOZG
4.0000 mg | LOZENGE | OROMUCOSAL | Status: DC | PRN
  Administered 2021-07-14 – 2021-07-16 (×15): 4 mg via ORAL

## 2021-07-14 MED ORDER — AMANTADINE HCL 100 MG PO CAPS
100.0000 mg | ORAL_CAPSULE | Freq: Two times a day (BID) | ORAL | Status: DC
  Administered 2021-07-14 – 2021-07-16 (×4): 100 mg via ORAL
  Filled 2021-07-14 (×9): qty 1

## 2021-07-14 MED ORDER — LEVOTHYROXINE SODIUM 50 MCG PO TABS
50.0000 ug | ORAL_TABLET | Freq: Every day | ORAL | Status: DC
  Administered 2021-07-15 – 2021-07-16 (×2): 50 ug via ORAL
  Filled 2021-07-14 (×4): qty 1

## 2021-07-14 NOTE — Progress Notes (Signed)
Patient compliant with medication requested vistaril for anxiety 8/10 and trazodone for sleep. Prn given and effective. Am blood draw completed.   Support and encouragement provided.

## 2021-07-14 NOTE — Group Note (Signed)
[facility] LCSW Group Therapy Note  07/14/2021  10:00-11:00am  Type of Therapy and Topic:  Group Therapy:  A Hero Worthy of Support  Participation Level:  Active   Description of Group:  Patients in this group were introduced to the concept that additional supports including self-support are an essential part of recovery.  Matching needs with supports to help fulfill those needs was explained.  Establishing boundaries that can gradually be increased or decreased was described, with patients giving their own examples of establishing appropriate boundaries in their lives.  A song entitled "My Own Hero" was played and a group discussion ensued in which patients stated it inspired them to help themselves in order to succeed, because other people cannot achieve their goals such as sobriety or stability for them.  A song was played called "Love Me More" which led to a discussion about being willing to believe we are worth the effort of being a self-support.   Therapeutic Goals: 1)  demonstrate the importance of being a key part of one's own support system 2)  discuss various available supports 3)  show how peer supports can be a valuable tool in recovery 4)  elicit ideas from patients about supports that need to be added   Summary of Patient Progress:  The patient expressed that something the group could not tell about them on the outside is that he has no patience and almost named his daughter [name] so "I would have some."  Several people expressed having something in common with the patient.  The patient stated current supports include his wife and daughters.  The patient expressed a desire and willingness to add sober friends after discharge to help in recovery.  The patient's participation was good at times.  Therapeutic Modalities:   Motivational Interviewing Activity  Maretta Los

## 2021-07-14 NOTE — [facility] Group Notes (Signed)
Psychoeducational Group Note  Date:  07/14/2021 Time:  1300-1400   Group Topic/Focus: This is a continuation of the group from Saturday. Pt's have been asked to formulate a list of 30 positives about themselves. This list is to be read 2 times a day for 30 days, looking in a mirror. Changing patterns of negative self talk. Also discussed is the fact that there have been some people who hurt [place] in the past. We keep that memory alive within [place]. Ways to cope with this are discused   Participation Level:  Active  Participation Quality:  Appropriate  Affect:  Appropriate  Cognitive:  Oriented  Insight: Improving  Engagement in Group:  Engaged  Modes of Intervention:  Activity, Discussion, Education, and Support  Additional Comments:  Rates energy at a 6/10. Participated fully in the group.  Paulino Rily

## 2021-07-14 NOTE — [facility] Group Notes (Signed)
Adult Psychoeducational Group Not Date:  07/14/2021 Time:  6047-9987 Group Topic/Focus: PROGRESSIVE RELAXATION. A group where deep breathing is taught and tensing and relaxation muscle groups is used. Imagery is used as well.  Pts are asked to imagine 3 pillars that hold them up when they are not able to hold themselves up.  Participation Level:  Active  Participation Quality:  Appropriate  Affect:  Appropriate  Cognitive:  Oriented  Insight: Improving  Engagement in Group:  Engaged  Modes of Intervention:  Activity, Discussion, Education, and Support  Additional Comments:  Pt rates energy at a 10/10. States his pillars are family, knowledge and the 'code of the hills'. Meaning civility, justice, respect for our fellow man.  [name]

## 2021-07-14 NOTE — Plan of Care (Signed)
Nurse discussed anxiety and coping skills with patient. 

## 2021-07-14 NOTE — Progress Notes (Signed)
Wife brought neurotransmitter remote control for Mouhamed but no Games developer. Patient notified and the remote is kept in the med room. On coming RN and Provider aware.

## 2021-07-14 NOTE — Progress Notes (Signed)
D:  Patient's self inventory sheet, patient has fair sleep, sleep medication helpful.  Fair appetite, low energy level, poor concentration.  Rated depression and anxiety 6, hopeless 5.  Denied withdrawals.  Checked tremors, cravings, agitation, irritability.  SI, no plan while at [facility] [place] after discharge,   Will not discuss method of hurting himself.  Denied physical pain.  (12)  Physical pain yes, worst pain #6 in past 24 hours, neck, back, hips (6).  Pain medicine is helping.  Plans to attend groups, talk to NP,  go over thoughts and feelings.   A:  Medications administered per MD orders.  Emotional support and encouragement given patient. R:  Denied SI and HI, contracts for safety.  Denied A/V hallucinations.  Denied pain.   Safety maintained with 15 minute checks.
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
## 2021-07-14 NOTE — [facility] Suicide Risk Assessment (Cosign Needed)
Suicide Risk Assessment  Admission Assessment    [facility] Admission Suicide Risk Assessment   Nursing information obtained from:  Patient Demographic factors:  Male, Caucasian, Low socioeconomic status, Unemployed Current Mental Status:  Suicidal ideation indicated by patient Loss Factors:  Financial problems / change in socioeconomic status Historical Factors:  Prior suicide attempts, Impulsivity Risk Reduction Factors:  Sense of responsibility to family, Living with another person, especially a relative  Total Time spent with patient: 1 hour Principal Problem: Bipolar I disorder, most recent episode depressed ([place]) Diagnosis:  Principal Problem:   Bipolar I disorder, most recent episode depressed ([place])  HPI:  Kaleth Koy. Mcgarvey is a 56 yo male with a history of anxiety, MDD, bipolar d/o, chronic pain syndrome & hypothyroidism who presented to the [facility] on 07/12/21 with complaints of worsening depression and suicidal ideation. Pt reported his stressors as financial, and a possible eviction.  Pt was deemed to be a danger to himself and transferred to this [facility] for treatment and stabilization of his mood.   Continued Clinical Symptoms: Pt reports worsening irritability, hopelessness, feelings of worthlessness, panic attacks, helplessness, anxiety for two weeks prior to this admission. Pt reports that prior to these depressive symptoms, he experienced mania where he was unable to focus on anything, his mind was racing, he had decreased need for sleep, lots of energy, was able to get his entire house cleaned, and did not feel hungry even though he ate just once daily. Pt reports sleeping just for 6 hrs in the 5 days that he had the manic symptoms. He is currently in need of continuous hospitalization to treat and stabilize his mood.  Alcohol Use Disorder Identification Test Final Score (AUDIT): 0 The "Alcohol Use Disorders Identification Test", Guidelines for Use in Primary Care, Second Edition.  World  Pharmacologist [facility]). Score between 0-7:  no or low risk or alcohol related problems. Score between 8-15:  moderate risk of alcohol related problems. Score between 16-19:  high risk of alcohol related problems. Score 20 or above:  warrants further diagnostic evaluation for alcohol dependence and treatment.  CLINICAL FACTORS:   Panic Attacks Depression:   Anhedonia Hopelessness Insomnia More than one psychiatric diagnosis Medical Diagnoses and Treatments/Surgeries  Musculoskeletal: Strength & Muscle Tone: decreased Gait & Station: unsteady Patient leans: N/A  Psychiatric Specialty Exam:  Presentation  General Appearance: Disheveled  Eye Contact:Fair  Speech:Clear and Coherent  Speech Volume:Normal  Handedness:Right  Mood and Affect  Mood:Depressed; Anxious  Affect:Congruent   Thought Process  Thought Processes:Coherent  Descriptions of Associations:Intact  Orientation:Full (Time, Place and Person)  Thought Content:Logical  History of Schizophrenia/Schizoaffective disorder:No  Duration of Psychotic Symptoms:N/A  Hallucinations:Hallucinations: None  Ideas of Reference:No data recorded Suicidal Thoughts:Suicidal Thoughts: Yes, Active SI Active Intent and/or Plan: With Plan  Homicidal Thoughts:Homicidal Thoughts: No  Sensorium  Memory:Immediate Good  Judgment:Fair  Insight:Fair  Executive Functions  Concentration:Fair  Attention Span:Fair  [place]  Psychomotor Activity  Psychomotor Activity:Psychomotor Activity: Restlessness; Tremor  Assets  Assets:Communication Skills; Social Support; Desire for Improvement  Sleep  Sleep:Sleep: Fair  Physical Exam: Physical Exam HENT:     Head: Normocephalic.     Right Ear: There is no impacted cerumen.     Nose: No congestion or rhinorrhea.  Eyes:     Pupils: Pupils are equal, round, and reactive to light.  Pulmonary:     Effort: Pulmonary  effort is normal.  Musculoskeletal:  General: No swelling.     Cervical back: No rigidity.  Neurological:     General: No focal deficit present.     Mental Status: He is alert and oriented to person, place, and time.  Psychiatric:        Behavior: Behavior normal.   Review of Systems  Constitutional: Negative.  Negative for chills and fever.  HENT: Negative.  Negative for congestion and sore throat.   Eyes: Negative.   Respiratory: Negative.  Negative for cough and shortness of breath.   Cardiovascular: Negative.  Negative for chest pain.  Gastrointestinal: Negative.  Negative for heartburn, nausea and vomiting.  Genitourinary: Negative.   Musculoskeletal: Negative.  Negative for joint pain and neck pain.  Skin: Negative.   Neurological:  Positive for tremors. Negative for dizziness, tingling and headaches.  Psychiatric/Behavioral:  Positive for depression and suicidal ideas. The patient is nervous/anxious and has insomnia.   Blood pressure 111/67, pulse 88, temperature 97.9 F (36.6 C), temperature source Oral, resp. rate 18, height 6' (1.829 m), weight 104.3 kg, SpO2 100 %. Body mass index is 31.19 kg/m.   COGNITIVE FEATURES THAT CONTRIBUTE TO RISK:  None    SUICIDE RISK:   Moderate:  Frequent suicidal ideation with limited intensity, and duration, some specificity in terms of plans, no associated intent, good self-control, limited dysphoria/symptomatology, some risk factors present, and identifiable protective factors, including available and accessible social support.  PLAN OF CARE:  PLAN Safety and Monitoring: Voluntary admission to inpatient psychiatric unit for safety, stabilization and treatment Daily contact with patient to assess and evaluate symptoms and progress in treatment Patient's case to be discussed in multi-disciplinary team meeting Observation Level : q15 minute checks Vital signs: q12 hours Precautions: suicide   Long Term Goal(s): Improvement in  symptoms so as ready for discharge   Short Term Goals: Ability to identify changes in lifestyle to reduce recurrence of condition will improve, Ability to verbalize feelings will improve, Ability to disclose and discuss suicidal ideas, Ability to demonstrate self-control will improve, Ability to identify and develop effective coping behaviors will improve, and Compliance with prescribed medications will improve   Physician Treatment Plan for Secondary Diagnosis:  Principal Problem:   Bipolar I disorder, most recent episode depressed (H[facility])   Medications Bipolar I disorder, most recent episode depressed ([facility]) -Continue Seroquel 100 mg nightly   Anxiety -Continue Hydroxyzine 25 mg every 6 hours PRN -Continue Buspar 10 mg TID   Tremors -Continue Propranolol ER 60 mg daily -Discontinue Cogentin to 1 mg BID -Start Amantadine 100 mg BID   Insomnia -Continue Trazodone 50 mg nightly   Fibromyalgia/chronic pain syndrome -Continue Oxy IR 20 mg QID (pt understands will not prescribe this medication on discharge) -Continue Robaxin 500 mg BID   Hypothyroidism -Continue Synthroid 50 mcg daily at 0600   Nicotine Addiction -Continue Nicotine lozenges Q 2 H PRN (home med)   Other PRNS -Continue Tylenol 650 mg every 6 hours PRN for mild pain -Continue Maalox 30 mg every 4 hrs PRN for indigestion -Continue Milk of Magnesia as needed every 6 hrs for constipation   Discharge Planning: Social work and case management to assist with discharge planning and identification of hospital follow-up needs prior to discharge Estimated LOS: 5-7 days Discharge Concerns: Need to establish a safety plan; Medication compliance and effectiveness Discharge Goals: Return home with outpatient referrals for mental health follow-up including medication management/psychotherapy  I certify that inpatient services furnished can reasonably be expected to improve the patient's condition.  Nicholes Rough,  NP 07/14/2021, 2:38 PM

## 2021-07-14 NOTE — H&P (Signed)
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 behaviors will improve, and Compliance with prescribed medications will improve  Physician Treatment Plan for Secondary Diagnosis:  Principal Problem:   Bipolar I disorder, most recent episode depressed (HCC)  Medications Bipolar I disorder, most recent episode depressed (HCC) -Continue Seroquel 100 mg nightly  Anxiety -Continue Hydroxyzine 25 mg every 6 hours PRN -Continue Buspar 10 mg TID  Tremors -Continue Propranolol ER 60 mg daily -Discontinue Cogentin to 1 mg BID -Start Amantadine 100 mg BID  Insomnia -Continue Trazodone 50 mg nightly  Fibromyalgia/chronic pain syndrome -Continue Oxy IR 20 mg QID (pt understands will not prescribe this medication on discharge) -Continue Robaxin 500 mg BID  Hypothyroidism -Continue Synthroid 50 mcg daily at 0600  Nicotine Addiction -Continue Nicotine lozenges Q 2 H PRN (home med)  Other PRNS -Continue  Tylenol 650 mg every 6 hours PRN for mild pain -Continue Maalox 30 mg every 4 hrs PRN for indigestion -Continue Milk of Magnesia as needed every 6 hrs for constipation  Discharge Planning: Social work and case management to assist with discharge planning and identification of hospital follow-up needs prior to discharge Estimated LOS: 5-7 days Discharge Concerns: Need to establish a safety plan; Medication compliance and effectiveness Discharge Goals: Return home with outpatient referrals for mental health follow-up including medication management/psychotherapy  I certify that inpatient services furnished can reasonably be expected to improve the patient's condition.    Nicholes Rough, NP 2/12/20232:37 PM

## 2021-07-15 ENCOUNTER — Other Ambulatory Visit (HOSPITAL_COMMUNITY): Payer: Self-pay

## 2021-07-15 ENCOUNTER — Encounter (HOSPITAL_COMMUNITY): Payer: Self-pay

## 2021-07-15 MED ORDER — PROPRANOLOL HCL ER 60 MG PO CP24
ORAL_CAPSULE | ORAL | 3 refills | Status: DC
  Filled 2021-07-15 (×2): qty 30, 30d supply, fill #0

## 2021-07-15 MED ORDER — METHOCARBAMOL 500 MG PO TABS
500.0000 mg | ORAL_TABLET | Freq: Three times a day (TID) | ORAL | Status: DC
  Administered 2021-07-15 – 2021-07-16 (×4): 500 mg via ORAL
  Filled 2021-07-15 (×10): qty 1

## 2021-07-15 MED ORDER — QUETIAPINE FUMARATE ER 50 MG PO TB24
150.0000 mg | ORAL_TABLET | Freq: Every day | ORAL | Status: DC
  Administered 2021-07-15: 150 mg via ORAL
  Filled 2021-07-15 (×4): qty 3

## 2021-07-15 NOTE — BH IP Treatment Plan (Signed)
Interdisciplinary Treatment and Diagnostic Plan Update  07/15/2021 Time of Session: 9;30am [name] MRN: [id number]  Principal Diagnosis: Bipolar I disorder, most recent episode depressed ([place])  Secondary Diagnoses: Principal Problem:   Bipolar I disorder, most recent episode depressed ([place])   Current Medications:  Current Facility-Administered Medications  Medication Dose Route Frequency Provider Last Rate Last Admin   acetaminophen (TYLENOL) tablet 650 mg  650 mg Oral Q6H PRN Nwoko, Uchenna E, PA       alum & mag hydroxide-simeth (MAALOX/MYLANTA) 200-200-20 MG/5ML suspension 30 mL  30 mL Oral Q4H PRN Nwoko, Uchenna E, PA       amantadine (SYMMETREL) capsule 100 mg  100 mg Oral BID Nicholes Rough, NP   100 mg at 07/15/21 0759   busPIRone (BUSPAR) tablet 10 mg  10 mg Oral TID Janine Limbo, MD   10 mg at 07/15/21 0759   hydrOXYzine (ATARAX) tablet 25 mg  25 mg Oral TID PRN Nwoko, Uchenna E, PA   25 mg at 07/14/21 0811   levothyroxine (SYNTHROID) tablet 50 mcg  50 mcg Oral Q0600 Nicholes Rough, NP   50 mcg at 07/15/21 9767   magnesium hydroxide (MILK OF MAGNESIA) suspension 30 mL  30 mL Oral Daily PRN Nwoko, Uchenna E, PA       methocarbamol (ROBAXIN) tablet 500 mg  500 mg Oral BID Nicholes Rough, NP   500 mg at 07/15/21 3419   nicotine polacrilex (COMMIT) lozenge 4 mg  4 mg Oral Q2H PRN Massengill, Ovid Curd, MD   4 mg at 07/15/21 1006   oxyCODONE (Oxy IR/ROXICODONE) immediate release tablet 20 mg  20 mg Oral Q6H Massengill, Nathan, MD   20 mg at 07/15/21 0614   pantoprazole (PROTONIX) EC tablet 40 mg  40 mg Oral Daily Hill, Jackie Plum, MD   40 mg at 07/15/21 0758   propranolol ER (INDERAL LA) 24 hr capsule 60 mg  60 mg Oral Daily Hill, Jackie Plum, MD   60 mg at 07/15/21 0759   QUEtiapine (SEROQUEL XR) 24 hr tablet 100 mg  100 mg Oral QHS Hill, Jackie Plum, MD   100 mg at 07/14/21 2106   traZODone (DESYREL) tablet 50 mg  50 mg Oral QHS Nicholes Rough, NP   50 mg at  07/14/21 2105   PTA Medications: Medications Prior to Admission  Medication Sig Dispense Refill Last Dose   Ascorbic Acid (VITAMIN C) 1000 MG tablet Take 2 (two) Tablet by mouth daily (Patient not taking: Reported on 07/13/2021) 20 tablet 0    aspirin 81 MG chewable tablet Chew 1 tablet (81 mg total) by mouth 2 (two) times daily. (Patient not taking: Reported on 07/13/2021) 45 tablet 0    azithromycin (ZITHROMAX Z-PAK) 250 MG tablet Take 2 tablets on day 1, then take 1 tablet daily for 4 additional days (Patient not taking: Reported on 07/13/2021) 6 tablet 0    benztropine (COGENTIN) 0.5 MG tablet Take 1 tablet by mouth 2 times daily. (Patient not taking: Reported on 07/13/2021) 60 tablet 2    busPIRone (BUSPAR) 10 MG tablet Take 1 tablet by mouth 3 times every day (Patient not taking: Reported on 07/13/2021) 90 tablet 1    Cholecalciferol (VITAMIN D) 50 MCG (2000 UT) tablet Take 1 tablet by mouth daily (Patient not taking: Reported on 07/13/2021) 3 tablet 0    Cholecalciferol (VITAMIN D) 50 MCG (2000 UT) tablet Take 1 (one) Tablet by mouth daily (Patient not taking: Reported on 07/13/2021) 10 tablet 0  divalproex (DEPAKOTE) 250 MG DR tablet Take 1 tablet (250 mg total) by mouth 3 (three) times daily. (Patient not taking: Reported on 03/17/2021) 90 tablet 3    ergocalciferol (VITAMIN D2) 1.25 MG (50000 UT) capsule Take 1 capsule by mouth once a week (Patient not taking: Reported on 07/13/2021) 4 capsule 1    esomeprazole (NEXIUM) 40 MG capsule Take 1 capsule by mouth once daily (Patient not taking: Reported on 07/13/2021) 30 capsule 1    Eszopiclone 3 MG TABS Take 1 tablet by mouth every day at bedtime (Patient taking differently: Take 3 mg by mouth at bedtime.) 30 tablet 1    Evolocumab (REPATHA SURECLICK) 710 MG/ML SOAJ Inject 140mg  into the skin twice a month (Patient taking differently: Inject 140 mg into the skin every 14 (fourteen) days.) 2 mL 12    hydrOXYzine (ATARAX) 25 MG tablet Take 1 tablet by  mouth 3 times every day as needed. (Patient taking differently: Take 25 mg by mouth 2 (two) times daily as needed for anxiety.) 90 tablet 3    lamoTRIgine (LAMICTAL) 100 MG tablet Take 1 tablet (100 mg total) by mouth 2 (two) times daily. (Patient not taking: Reported on 03/17/2021) 60 tablet 0    levothyroxine (SYNTHROID) 50 MCG tablet Take 1 tablet by mouth daily 30 tablet 1    loxapine (LOXITANE) 50 MG capsule Take 1 capsule (50 mg total) by mouth every 12 (twelve) hours. (Patient not taking: Reported on 03/17/2021) 60 capsule 0    loxapine (LOXITANE) 50 MG capsule Take one capsule by mouth 2 times daily. 60 capsule 2    methocarbamol (ROBAXIN) 500 MG tablet Take 1 tablet by mouth every six hours as needed for pain and spasm (Patient not taking: Reported on 07/13/2021) 40 tablet 1    methocarbamol (ROBAXIN) 750 MG tablet Take 1 tablet (750 mg total) by mouth 4 (four) times daily as needed. (Patient taking differently: Take 750 mg by mouth every 6 (six) hours.) 56 tablet 6    Multiple Vitamins-Minerals (MULTIVITAMIN WITH MINERALS) tablet Take 1 tablet by mouth daily.      naloxone (NARCAN) nasal spray 4 mg/0.1 mL Use 1 spray in nose as directed (Patient not taking: Reported on 07/13/2021) 2 each 3    nicotine (NICODERM CQ - DOSED IN MG/24 HOURS) 21 mg/24hr patch Place 21 mg onto the skin daily as needed (FOR SMOKING CESSATION WHEN HOSPITALIZED).      nicotine polacrilex (COMMIT) 4 MG lozenge Take 4 mg by mouth as needed for smoking cessation.      nicotine polacrilex (COMMIT) 4 MG lozenge Use as directed (Patient not taking: Reported on 07/13/2021) 72 lozenge 0    nitroGLYCERIN (NITROSTAT) 0.4 MG SL tablet Dissole 1 Tablet under the tongue every 5 minutes as needed for chest pain. MAX 3/day. (Patient not taking: Reported on 07/13/2021) 25 tablet 1    ondansetron (ZOFRAN) 4 MG tablet Take 4 mg by mouth every 6 (six) hours as needed for nausea.      oseltamivir (TAMIFLU) 75 MG capsule Take 1 capsule by  mouth two times daily (Patient not taking: Reported on 07/13/2021) 10 capsule 0    oxyCODONE (OXY IR/ROXICODONE) 5 MG immediate release tablet Take 1 tablet by mouth every six hours as needed in addition to baseline meds for post op pain (Patient not taking: Reported on 07/13/2021) 20 tablet 0    oxyCODONE (ROXICODONE) 15 MG immediate release tablet Take 1 tablet by mouth four times daily (Patient not taking:  Reported on 07/13/2021) 120 tablet 0    Oxycodone HCl 20 MG TABS Take 1 Tablet by mouth four times daily (Patient taking differently: Take 20 mg by mouth 4 (four) times daily.) 120 tablet 0    predniSONE (STERAPRED UNI-PAK 21 TAB) 5 MG (21) TBPK tablet Take as directed per package instructions (Patient not taking: Reported on 07/13/2021) 21 tablet 0    propranolol ER (INDERAL LA) 120 MG 24 hr capsule Take 1 capsule (120 mg total) by mouth daily. (Patient not taking: Reported on 03/17/2021) 30 capsule 2    traZODone (DESYREL) 150 MG tablet Take 1 tablet (150 mg total) by mouth daily. (Patient taking differently: Take 75 mg by mouth daily.) 30 tablet 3    Vitamin D, Ergocalciferol, (DRISDOL) 50000 units CAPS capsule Take 1 capsule (50,000 Units total) by mouth every Thursday. For calcium supplementation (Patient taking differently: Take 50,000 Units by mouth every Thursday.) 1 capsule 0    Zinc 50 MG TABS Take 1 (one) Tablet by mouth daily (Patient not taking: Reported on 07/13/2021) 10 tablet 0     Patient Stressors: Financial difficulties   Health problems    Patient Strengths: Ability for insight  Communication skills   Treatment Modalities: Medication Management, Group therapy, Case management,  1 to 1 session with clinician, Psychoeducation, Recreational therapy.   Physician Treatment Plan for Primary Diagnosis: Bipolar I disorder, most recent episode depressed ([place]) Long Term Goal(s): Improvement in symptoms so as ready for discharge   Short Term Goals: Ability to identify changes in  lifestyle to reduce recurrence of condition will improve Ability to verbalize feelings will improve Ability to disclose and discuss suicidal ideas Ability to demonstrate self-control will improve Ability to identify and develop effective coping behaviors will improve Compliance with prescribed medications will improve  Medication Management: Evaluate patient's response, side effects, and tolerance of medication regimen.  Therapeutic Interventions: 1 to 1 sessions, Unit Group sessions and Medication administration.  Evaluation of Outcomes: Progressing  Physician Treatment Plan for Secondary Diagnosis: Principal Problem:   Bipolar I disorder, most recent episode depressed ([place])  Long Term Goal(s): Improvement in symptoms so as ready for discharge   Short Term Goals: Ability to identify changes in lifestyle to reduce recurrence of condition will improve Ability to verbalize feelings will improve Ability to disclose and discuss suicidal ideas Ability to demonstrate self-control will improve Ability to identify and develop effective coping behaviors will improve Compliance with prescribed medications will improve     Medication Management: Evaluate patient's response, side effects, and tolerance of medication regimen.  Therapeutic Interventions: 1 to 1 sessions, Unit Group sessions and Medication administration.  Evaluation of Outcomes: Progressing   RN Treatment Plan for Primary Diagnosis: Bipolar I disorder, most recent episode depressed ([place]) Long Term Goal(s): Knowledge of disease and therapeutic regimen to maintain health will improve  Short Term Goals: Ability to remain free from injury will improve, Ability to verbalize frustration and anger appropriately will improve, Ability to demonstrate self-control, Ability to identify and develop effective coping behaviors will improve, and Compliance with prescribed medications will improve  Medication Management: RN will administer  medications as ordered by provider, will assess and evaluate patient's response and provide education to patient for prescribed medication. RN will report any adverse and/or side effects to prescribing provider.  Therapeutic Interventions: 1 on 1 counseling sessions, Psychoeducation, Medication administration, Evaluate responses to treatment, Monitor vital signs and CBGs as ordered, Perform/monitor CIWA, COWS, AIMS and Fall Risk screenings as ordered, Perform wound  care treatments as ordered.  Evaluation of Outcomes: Progressing   LCSW Treatment Plan for Primary Diagnosis: Bipolar I disorder, most recent episode depressed ([place]) Long Term Goal(s): Safe transition to appropriate next level of care at discharge, Engage patient in therapeutic group addressing interpersonal concerns.  Short Term Goals: Engage patient in aftercare planning with referrals and resources, Increase social support, Increase ability to appropriately verbalize feelings, Identify triggers associated with mental health/substance abuse issues, and Increase skills for wellness and recovery  Therapeutic Interventions: Assess for all discharge needs, 1 to 1 time with Social worker, Explore available resources and support systems, Assess for adequacy in community support network, Educate family and significant other(s) on suicide prevention, Complete Psychosocial Assessment, Interpersonal group therapy.  Evaluation of Outcomes: Progressing   Progress in Treatment: Attending groups: Yes. Participating in groups: Yes. Taking medication as prescribed: Yes. Toleration medication: Yes. Family/Significant other contact made: Yes, individual(s) contacted:  wife Patient understands diagnosis: Yes. Discussing patient identified problems/goals with staff: Yes. Medical problems stabilized or resolved: Yes. Denies suicidal/homicidal ideation: Yes. Issues/concerns per patient self-inventory: No.   New problem(s) identified: No,  Describe:  none  New Short Term/Long Term Goal(s): medication stabilization, elimination of SI thoughts, development of comprehensive mental wellness plan.    Patient Goals:  "Get rid of suicidal thoughts"  Discharge Plan or Barriers: Patient is to receive therapy and medication management through [facility] in [place].   Reason for Continuation of Hospitalization: Depression Medication stabilization Suicidal ideation  Estimated Length of Stay: 3-5 days   Scribe for Treatment Team: Vassie Moselle, LCSW 07/15/2021 10:41 AM

## 2021-07-15 NOTE — Progress Notes (Signed)
Pt has nerve stimulator (looks like a black phone) in med draw. No charger present, pt states his wife will bring it later on. Pt's nebulizer stored in designated brown cabinets by nurse station.
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
## 2021-07-15 NOTE — Group Note (Signed)
[facility] LCSW Group Therapy Note   Group Date: 07/15/2021 Start Time: 1300 End Time: 1400   Type of Therapy and Topic:  Group Therapy:  Fear  Participation Level:  Active  Description of Group: In this process group, patients discussed things that make feel them feel nervous or scared.  Patients discussed what they think about when they feel these emotions.  Patients were given the opportunity to share openly and support each other.  The group discussed where they feel their fears in their body.  Patients were encouraged to identify new coping mechanisms to use when fears arise.  Therapeutic Goals Patient will verbalize fears and what thoughts occur when they feel this emotion. Patients will verbalize where on their body they feel fear. Patients will explore coping mechanisms they can use in the future when fear arises.   Summary of Patient Progress:  The topic for group today was emotional regulation. This group focused on both positive and negative emotion identification and allowed group members to process ways to identify feelings, regulate negative emotions, and find healthy ways to manage internal/external emotions. Group members were asked to reflect on a time when their reaction to an emotion led to a negative outcome and explored how alternative responses using emotion regulation would have benefited them. Group members were also asked to discuss a time when emotion regulation was utilized when a negative emotion was experienced.     Therapeutic Modalities Cognitive Behavioral Therapy Motivational Interviewing   Darleen Crocker, [place]

## 2021-07-15 NOTE — Group Note (Signed)
Recreation Therapy Group Note   Group Topic:Stress Management  Group Date: 07/15/2021 Start Time: 0945 End Time: 1000 Facilitators: Victorino Sparrow, [place] Location: [address] Dayroom   Goal Area(s) Addresses:  Patient will identify positive stress management techniques. Patient will identify benefits of using stress management post d/c.  Group Description:  Meditation.  LRT played a meditation that focused on using positive affirmations.  Patients were to listen, follow and repeat the affirmations to themselves as the meditation played.  At completion of meditation, LRT discussed with patients were they could find other stress management techniques like Youtube and Apps.    Affect/Mood: Appropriate   Participation Level: Active   Participation Quality: Independent   Behavior: Appropriate   Speech/Thought Process: Focused   Insight: Good   Judgement: Good   Modes of Intervention: Meditation   Patient Response to Interventions:  Engaged   Education Outcome:  Acknowledges education and In group clarification offered    Clinical Observations/Individualized Feedback: Pt attended and participated in activity.    Plan: Continue to engage patient in RT group sessions 2-3x/week.   Victorino Sparrow, LRT,CTRS 07/15/2021 1:25 PM

## 2021-07-15 NOTE — BHH Counselor (Addendum)
CSW attempted to contact the [employer] [phone] but there was no answer.  CSW left a HIPAA Approved voicemail for Ronnald Nian asking for a return call.  CSW will attempt to contact the agency again today.    CSW attempted to contact the [employer] [phone] but there was no answer.  CSW left a HIPAA Approved voicemail in the general mailbox asking for a return call.  CSW will attempt to contact the agency again today.    CSW spoke with Ronnald Nian at [employer] who states that the Pt is on a Section 8 Voucher through [place] but lives in [place].  She states that the Pt's case worker is Santiago Glad Barns and her EXT is 213.  CSW attempted to contact Mrs. Barns but received no answer.  CSW left a HIPAA approved voicemail asking for a return call.  CSW will attempt to call Mrs. Barns again later today to follow up on this housing matter.   CSW spoke with Mrs. Santiago Glad Barns who states that she has no record of the Pt being evicted.  She states that there is nothing in her computer system showing that the Pt received a letter of any kind.  Mrs. Barns transferred CSW to Zoila Shutter, [employer] and her EXT is 209.  Mrs. Margaretmary Bayley was not available.  CSW left a HIPAA compliant voicemail asking for a return call.  CSW will attempt to contact Mrs. Margaretmary Bayley again today.

## 2021-07-15 NOTE — BHH Group Notes (Signed)
Spiritual care group on grief and loss facilitated by chaplains Amy Burris, Mdiv and Janne Napoleon, [facility]   Group Goal:   Support / Education around grief and loss   Members engage in facilitated group support and psycho-social education.   Group Description:  Spiritual care group on grief and loss facilitated by chaplain Janne Napoleon, [facility]   Group Goal:   Support / Education around grief and loss   Members engage in facilitated group support and psycho-social education.   Group Description:   Following introductions and group rules, group members engaged in facilitated group dialog and support around topic of loss, with particular support around experiences of loss in their lives. Group Identified types of loss (relationships / self / things) and identified patterns, circumstances, and changes that precipitate losses. Reflected on thoughts / feelings around loss, normalized grief responses, and recognized variety in grief experience. Group noted Worden's four tasks of grief in discussion.   Group drew on Adlerian / Rogerian, narrative, [place],   Patient Progress: [name] attended group and also demonstrated active engagement through attentive listening and also through making some relevant and insightful comments.  Corbin Ade, MDiv

## 2021-07-15 NOTE — BHH Suicide Risk Assessment (Signed)
[name] INPATIENT:  Family/Significant Other Suicide Prevention Education  Suicide Prevention Education:  Education Completed; [name] [phone] (Wife) has been identified by the patient as the family member/significant other with whom the patient will be residing, and identified as the person(s) who will aid the patient in the event of a mental health crisis (suicidal ideations/suicide attempt).  With written consent from the patient, the family member/significant other has been provided the following suicide prevention education, prior to the and/or following the discharge of the patient.  The suicide prevention education provided includes the following: Suicide risk factors Suicide prevention and interventions National Suicide Hotline telephone number [facility] assessment telephone number [facility] Emergency Assistance [place] and/or Residential Mobile Crisis Unit telephone number  Request made of family/significant other to: Remove weapons (e.g., guns, rifles, knives), all items previously/currently identified as safety concern.   Remove drugs/medications (over-the-counter, prescriptions, illicit drugs), all items previously/currently identified as a safety concern.  The family member/significant other verbalizes understanding of the suicide prevention education information provided.  The family member/significant other agrees to remove the items of safety concern listed above.  CSW spoke with [name] who states that she and [name] are not living together at this time.  She states that he lives alone at his apartment and was recently sent an eviction letter.  She states that "He has a low frustration level".  She reports that the letter stated that he was "having a lot of people over to the home late at night, people staying over, and drugs in and out of the home".  She states that he has a Physiological scientist that comes and the home to help him and that she  also comes to the home to help him with his medications each day.  She states that he does have two cats and is only allowed to have 1.  [name] states that they sent in a request for a court hearing but it was sent in 1 day late and she is not sure that it will be accepted.  [name] states that if [name] gets evicted from his home then he can live with her until he finds another home.  She states that his concern is that if he is evicted then he will be unable to get housing assistance in the future.  She states that there are no firearms or weapons in his home. CSW completed SPE with [name].    [name] 07/15/2021, 10:20 AM

## 2021-07-15 NOTE — Group Note (Signed)
[facility] LCSW Group Therapy Note   Group Date: 07/15/2021 Start Time: 1300 End Time: 1400   Type of Therapy/Topic:  Group Therapy:  Emotion Regulation  Participation Level:  Active    Description of Group:    The purpose of this group is to assist patients in learning to regulate negative emotions and experience positive emotions. Patients will be guided to discuss ways in which they have been vulnerable to their negative emotions. These vulnerabilities will be juxtaposed with experiences of positive emotions or situations, and patients challenged to use positive emotions to combat negative ones. Special emphasis will be placed on coping with negative emotions in conflict situations, and patients will process healthy conflict resolution skills.  Therapeutic Goals: Patient will identify two positive emotions or experiences to reflect on in order to balance out negative emotions:  Patient will label two or more emotions that they find the most difficult to experience:  Patient will be able to demonstrate positive conflict resolution skills through discussion or role plays:   Summary of Patient Progress:   Pt shared during introductions his name and that today they are feeling bored to death. Pt was appropriate and attentive during group discussion.    Therapeutic Modalities:   Cognitive Behavioral Therapy Feelings Identification Dialectical Behavioral Therapy   Mliss Fritz, LCSWA

## 2021-07-15 NOTE — Progress Notes (Signed)
D- Patient alert and oriented x4. Patient in stable mood.  Denies SI, HI, AVH. Pt interacting positively with staff and peers.Pt stating he is doing much better and his pain is being controlled effectively.     A- Scheduled medications administered to patient, per MD orders.Routine safety checks conducted every 15 minutes.  Patient informed to notify staff with problems or concerns.   R- Patient compliant with medications and verbalized understanding treatment plan. Patient receptive, calm, and cooperative.     07/15/21 2115  Psych Admission Type (Psych Patients Only)  Admission Status Voluntary  Psychosocial Assessment  Patient Complaints None  Eye Contact Avertive  Facial Expression Animated;Fixed smile  Affect Appropriate to circumstance  Speech Logical/coherent  Interaction Assertive  Motor Activity Slow  Appearance/Hygiene Disheveled;In scrubs  Behavior Characteristics Cooperative;Appropriate to situation  Mood Pleasant  Thought Process  Coherency WDL  Content WDL  Delusions WDL  Perception WDL  Hallucination None reported or observed  Judgment WDL  Confusion WDL  Danger to Self  Current suicidal ideation? Denies  Self-Injurious Behavior No self-injurious ideation or behavior indicators observed or expressed   Agreement Not to Harm Self Yes  Description of Agreement Verbally contracts for safety  Danger to Others  Danger to Others None reported or observed

## 2021-07-15 NOTE — Progress Notes (Signed)
The patient attended the evening group and was appropriate.

## 2021-07-15 NOTE — BHH Counselor (Signed)
CSW spoke with the Pt who states that he is living in the [employer] but his information and payments are handled by the [employer].  He states that he received a letter of eviction in January of 2023 and he sent in a request for a hearing on 07/10/2021 but that letter was sent in 1 day late.  He states that he is not sure if they will accept it since it was late. He states that a hearing has not be scheduled yet and he is not sure if he can return to his apartment after discharge.  CSW will attempt to contact the Housing Authority to check on the status of this eviction and hearing date.
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
## 2021-07-15 NOTE — Progress Notes (Signed)
Pt denied SI/HI/AVH.  Pt taking pain medication q 6 hours.    07/15/21 0815  Psych Admission Type (Psych Patients Only)  Admission Status Voluntary  Psychosocial Assessment  Patient Complaints None  Eye Contact Brief  Facial Expression Flat  Affect Anxious  Speech Logical/coherent  Interaction Assertive  Motor Activity Slow  Appearance/Hygiene Disheveled;In scrubs  Behavior Characteristics Cooperative  Mood Pleasant  Thought Process  Coherency WDL  Content WDL  Delusions WDL  Perception WDL  Hallucination None reported or observed  Judgment WDL  Confusion WDL  Danger to Self  Current suicidal ideation? Denies  Self-Injurious Behavior No self-injurious ideation or behavior indicators observed or expressed   Agreement Not to Harm Self Yes  Description of Agreement Verbally contracts for safety  Danger to Others  Danger to Others None reported or observed

## 2021-07-16 ENCOUNTER — Other Ambulatory Visit (HOSPITAL_COMMUNITY): Payer: Self-pay

## 2021-07-16 DIAGNOSIS — F3130 Bipolar disorder, current episode depressed, mild or moderate severity, unspecified: Principal | ICD-10-CM

## 2021-07-16 LAB — LIPID PANEL
Cholesterol: 176 mg/dL (ref 0–200)
HDL: 57 mg/dL (ref >40)
LDL Cholesterol: 89 mg/dL (ref 0–99)
Total CHOL/HDL Ratio: 3.1 RATIO
Triglycerides: 152 mg/dL — ABNORMAL HIGH (ref <150)
VLDL: 30 mg/dL (ref 0–40)

## 2021-07-16 LAB — CBC WITH DIFFERENTIAL/PLATELET
Abs Immature Granulocytes: 0.05 10*3/uL (ref 0.00–0.07)
Basophils Absolute: 0.1 10*3/uL (ref 0.0–0.1)
Basophils Relative: 1 %
Eosinophils Absolute: 0.3 10*3/uL (ref 0.0–0.5)
Eosinophils Relative: 4 %
HCT: 38.4 % — ABNORMAL LOW (ref 39.0–52.0)
Hemoglobin: 12.9 g/dL — ABNORMAL LOW (ref 13.0–17.0)
Immature Granulocytes: 1 %
Lymphocytes Relative: 37 %
Lymphs Abs: 2.8 10*3/uL (ref 0.7–4.0)
MCH: 28 pg (ref 26.0–34.0)
MCHC: 33.6 g/dL (ref 30.0–36.0)
MCV: 83.3 fL (ref 80.0–100.0)
Monocytes Absolute: 0.8 10*3/uL (ref 0.1–1.0)
Monocytes Relative: 11 %
Neutro Abs: 3.6 10*3/uL (ref 1.7–7.7)
Neutrophils Relative %: 46 %
Platelets: 333 10*3/uL (ref 150–400)
RBC: 4.61 MIL/uL (ref 4.22–5.81)
RDW: 15.1 % (ref 11.5–15.5)
WBC: 7.6 10*3/uL (ref 4.0–10.5)
nRBC: 0 % (ref 0.0–0.2)

## 2021-07-16 MED ORDER — AMANTADINE HCL 100 MG PO CAPS
100.0000 mg | ORAL_CAPSULE | Freq: Two times a day (BID) | ORAL | 0 refills | Status: DC
  Filled 2021-07-16: qty 60, 30d supply, fill #0

## 2021-07-16 MED ORDER — HYDROXYZINE HCL 25 MG PO TABS
25.0000 mg | ORAL_TABLET | Freq: Three times a day (TID) | ORAL | 0 refills | Status: DC | PRN
  Filled 2021-07-16: qty 60, 20d supply, fill #0

## 2021-07-16 MED ORDER — NICOTINE POLACRILEX 4 MG MT LOZG: 4.0000 mg | LOZENGE | OROMUCOSAL | 0 refills | Status: DC | PRN

## 2021-07-16 MED ORDER — PANTOPRAZOLE SODIUM 40 MG PO TBEC
40.0000 mg | DELAYED_RELEASE_TABLET | Freq: Every day | ORAL | 0 refills | Status: DC
  Filled 2021-07-16: qty 30, 30d supply, fill #0

## 2021-07-16 MED ORDER — TRAZODONE HCL 50 MG PO TABS
50.0000 mg | ORAL_TABLET | Freq: Every day | ORAL | 0 refills | Status: DC
  Filled 2021-07-16: qty 30, 30d supply, fill #0

## 2021-07-16 MED ORDER — QUETIAPINE FUMARATE ER 150 MG PO TB24
150.0000 mg | ORAL_TABLET | Freq: Every day | ORAL | 0 refills | Status: DC
  Filled 2021-07-16 (×2): qty 30, 30d supply, fill #0

## 2021-07-16 MED ORDER — PROPRANOLOL HCL ER 60 MG PO CP24
60.0000 mg | ORAL_CAPSULE | Freq: Every day | ORAL | 0 refills | Status: DC
  Filled 2021-07-16: qty 30, 30d supply, fill #0

## 2021-07-16 MED ORDER — LEVOTHYROXINE SODIUM 50 MCG PO TABS
50.0000 ug | ORAL_TABLET | Freq: Every day | ORAL | 0 refills | Status: DC
  Filled 2021-07-16 – 2021-07-28 (×2): qty 30, 30d supply, fill #0

## 2021-07-16 MED ORDER — BUSPIRONE HCL 10 MG PO TABS
10.0000 mg | ORAL_TABLET | Freq: Three times a day (TID) | ORAL | 0 refills | Status: DC
  Filled 2021-07-16: qty 90, 30d supply, fill #0

## 2021-07-16 MED ORDER — QUETIAPINE FUMARATE ER 150 MG PO TB24
150.0000 mg | ORAL_TABLET | Freq: Every day | ORAL | 0 refills | Status: DC
Start: 2021-07-16 — End: 2021-07-16
  Filled 2021-07-16: qty 30, 30d supply, fill #0

## 2021-07-16 NOTE — [facility] Suicide Risk Assessment (Signed)
Suicide Risk Assessment  Discharge Assessment    [facility] Discharge Suicide Risk Assessment   Principal Problem: Bipolar I disorder, most recent episode depressed [facility]) Discharge Diagnoses: Principal Problem:   Bipolar I disorder, most recent episode depressed ([place])  HPI: [name] is a 56 yo male with a history of anxiety, MDD, bipolar d/o, chronic pain syndrome & hypothyroidism who presented to the [facility] on 07/12/21 with complaints of worsening depression and suicidal ideation. Pt reported his stressors as financial, and a possible eviction.  Pt was deemed to be a danger to himself and transferred to this [facility] for treatment and stabilization of his mood.  HOSPITAL COURSE:  During the patient's hospitalization, patient had extensive initial psychiatric evaluation, and follow-up psychiatric evaluations every day. Psychiatric diagnoses provided upon initial assessment was as follows: Bipolar I disorder, most recent episode depressed ([place]). This diagnosis remained unchanged through entire hospitalization. The following medication adjustments were initiated on admission:  Bipolar I disorder, most recent episode depressed ([place]) -Start Seroquel 100 mg nightly   Anxiety -Continue Hydroxyzine 25 mg every 6 hours PRN -Continue Buspar 10 mg TID   Tremors -Continue Propranolol ER 60 mg daily -Discontinue Cogentin to 1 mg BID -Start Amantadine 100 mg BID   Insomnia -Continue Trazodone 50 mg nightly   Fibromyalgia/chronic pain syndrome -Continue Oxy IR 20 mg QID (pt understands will not prescribe this medication on discharge) -Continue Robaxin 500 mg BID   Hypothyroidism -Continue Synthroid 50 mcg daily at 0600   Nicotine Addiction -Continue Nicotine lozenges Q 2 H PRN (home med)  During the hospitalization, adjustments were made to the patient's psychiatric medication regimen with the final medication regimen being as follows:   Medications Bipolar I disorder, most recent episode  depressed ([facility]) -Increase Seroquel to 150 mg nightly   Anxiety -Continue Hydroxyzine 25 mg every 6 hours PRN -Continue Buspar 10 mg TID   Tremors -Continue Propranolol ER 60 mg daily -Discontinued Cogentin to 1 mg BID on 2/12 -Continue Amantadine 100 mg BID   Insomnia -Continue Trazodone 50 mg nightly   Fibromyalgia/chronic pain syndrome -Continue Oxy IR 20 mg QID (pt understands will not prescribe this medication on discharge)  -Increase Robaxin to 500 mg to TID (pt understood that this med will not be prescribed on discharge)   Hypothyroidism -Continue Synthroid 50 mcg daily at 0600   Nicotine Addiction -Continue Nicotine lozenges Q 2 H PRN (home med)  Patient's care was discussed during the interdisciplinary team meeting every day during the hospitalization. The patient denies having side effects to prescribed psychiatric medication.  The patient was evaluated each day by a clinical provider to ascertain response to treatment. Improvement was noted by the patient's report of decreasing symptoms, improved sleep and appetite, affect, medication tolerance, behavior, and participation in unit programming.  Patient was asked each day to complete a self inventory noting mood, mental status, pain, new symptoms, anxiety and concerns.   Symptoms were reported as significantly decreased or resolved completely by discharge.  The patient reports that their mood is stable.  The patient denied having suicidal thoughts for more than 48 hours prior to discharge.  Patient denies having homicidal thoughts.  Patient denies having auditory hallucinations.  Patient denies any visual hallucinations or other symptoms of psychosis.   The patient is motivated to continue taking medication with a goal of continued improvement in mental health. The patient reports their target psychiatric symptoms of bipolar 1 disorder responded well to the psychiatric medications, and the patient  reports overall benefit  other psychiatric hospitalization. Supportive psychotherapy was provided to the patient. The patient also participated in regular group therapy while hospitalized. Coping skills, problem solving as well as relaxation therapies were also part of the unit programming.  Labs were reviewed with the patient, and abnormal results were discussed with the patient.  Total Time spent with patient: 30 minutes  Musculoskeletal: Strength & Muscle Tone: within normal limits Gait & Station: normal Patient leans: N/A  Psychiatric Specialty Exam  Presentation  General Appearance: Appropriate for Environment; Fairly Groomed  Eye Contact:Good  Speech:Clear and Coherent  Speech Volume:Normal  Handedness:Right  Mood and Affect  Mood:Euphoric  Duration of Depression Symptoms: Greater than two weeks  Affect:Appropriate  Thought Process  Thought Processes:Coherent  Descriptions of Associations:Intact  Orientation:Full (Time, Place and Person)  Thought Content:Logical  History of Schizophrenia/Schizoaffective disorder:No  Duration of Psychotic Symptoms:N/A  Hallucinations:Hallucinations: None  Ideas of Reference:None  Suicidal Thoughts:Suicidal Thoughts: No  Homicidal Thoughts:Homicidal Thoughts: No   Sensorium  Memory:Immediate Good; Recent Good  Judgment:Good  Insight:Good  Executive Functions  Concentration:Good  Attention Span:Good  [place] of Knowledge:Good  Language:Good  Psychomotor Activity  Psychomotor Activity:Psychomotor Activity: Normal  Assets  Assets:Communication Skills  Sleep  Sleep:Sleep: Good  Physical Exam: Physical Exam Constitutional:      Appearance: Normal appearance.  HENT:     Head: Normocephalic.     Nose: Nose normal.  Eyes:     Pupils: Pupils are equal, round, and reactive to light.  Pulmonary:     Effort: Pulmonary effort is normal.  Musculoskeletal:        General: Normal range of motion.     Cervical back:  Normal range of motion. No rigidity.  Neurological:     General: No focal deficit present.     Mental Status: He is alert and oriented to person, place, and time.     Sensory: No sensory deficit.     Coordination: Coordination normal.  Psychiatric:        Behavior: Behavior normal.   Review of Systems  Constitutional: Negative.  Negative for fever.  HENT: Negative.  Negative for sore throat.   Eyes: Negative.   Respiratory: Negative.  Negative for cough, sputum production and shortness of breath.   Cardiovascular: Negative.  Negative for chest pain and palpitations.  Gastrointestinal: Negative.  Negative for heartburn, nausea and vomiting.  Genitourinary: Negative.   Musculoskeletal:  Positive for back pain (this is a chronic condition).  Skin: Negative.  Negative for rash.  Neurological: Negative.  Negative for headaches.  Psychiatric/Behavioral:  Positive for depression (improving with medications). The patient is not nervous/anxious.   Blood pressure 105/80, pulse 99, temperature (!) 97.5 F (36.4 C), temperature source Oral, resp. rate 20, height 6' (1.829 m), weight 104.3 kg, SpO2 98 %. Body mass index is 31.19 kg/m.  Mental Status Per Nursing Assessment::   On Admission:  Suicidal ideation indicated by patient  Demographic Factors:  Male, Caucasian, and Living alone  Loss Factors: NA  Historical Factors: NA  Risk Reduction Factors:   Positive social support  Continued Clinical Symptoms:  Pt reports improvement in depressive symptoms  Cognitive Features That Contribute To Risk:  None    Suicide Risk:  Minimal: No identifiable suicidal ideation.  Patients presenting with no risk factors but with morbid ruminations; may be classified as minimal risk based on the severity of the depressive symptoms   Follow-up [place], [place]. Go on  07/24/2021.   Why: You have a hospital follow up appointment for therapy and medication management  services on  07/24/21 at 9:00 am.  This appointment will be held in person (afterwards appts may be Virtual).  Please enter through the front entrance. Contact information: [place] [address] [phone]                Plan Of Care/Follow-up recommendations:  The patient is able to verbalize their individual safety plan to this provider.  # It is recommended to the patient to continue psychiatric medications as prescribed, after discharge from the hospital.    # It is recommended to the patient to follow up with your outpatient psychiatric provider and PCP.  # It was discussed with the patient, the impact of alcohol, drugs, tobacco have been there overall psychiatric and medical wellbeing, and total abstinence from substance use was recommended the patient.ed.  # Prescriptions provided or sent directly to preferred pharmacy at discharge. Patient agreeable to plan. Given opportunity to ask questions. Appears to feel comfortable with discharge.    # In the event of worsening symptoms, the patient is instructed to call the crisis hotline, 911 and or go to the nearest ED for appropriate evaluation and treatment of symptoms. To follow-up with primary care provider for other medical issues, concerns and or health care needs  # Patient was discharged home with a plan to follow up as noted above.   Nicholes Rough, NP 07/16/2021, 12:30 PM

## 2021-07-16 NOTE — [facility] Counselor (Signed)
CSW spoke with [name], Director of Operations at [employer] [phone], EXT [id number] who states that the Pt did receive a letter asking him to stop participating in several things that were occurring at his home.  She states that he was "smoking Marijuana inside the residence, having guests living in the home, has an unauthorized pet in the home, and has been creating loud noises that have been disturbing his neighbors".  Mrs. [name] states that at this time the Pt is not being evicted and she states "if he is not being evicted then he is not losing his [place] [address]".  Mrs. [name] states that as long as he is not evicted and can pay his rent then he will not be homeless and will not lose his funding.  Mrs. [name] states that the Pt will need to contact his landlord to discuss the matter further.  She also states that her office has not received his letter to request a hearing to address the matter of the complaints that were filed against him.  CSW provided the Pt with the information for his landlord (name and phone number).  CSW explained the situation to the Pt and asked him to contact his landlord once he got back home to discuss the matter further and attempt to work something out with the landlord.  The Pt stated that he understood and would be contacting his landlord.

## 2021-07-16 NOTE — Discharge Summary (Addendum)
Physician Discharge Summary Note  Patient:  [name] is an 56 y.o., male MRN:  [id number] DOB:  1965/08/18 Patient phone:  [phone] (home)  Patient address:   [address] [place] [address],  Total Time spent with patient: 30 minutes  Date of Admission:  07/13/2021 Date of Discharge: 07/16/2021  HPI: [name] is a 56 yo male with a history of anxiety, MDD, bipolar d/o, chronic pain syndrome & hypothyroidism who presented to the [facility] on 07/12/21 with complaints of worsening depression and suicidal ideation. Pt reported his stressors as financial, and a possible eviction.  Pt was deemed to be a danger to himself and transferred to this [facility] for treatment and stabilization of his mood.  HOSPITAL COURSE:  During the patient's hospitalization, patient had extensive initial psychiatric evaluation, and follow-up psychiatric evaluations every day. Psychiatric diagnoses provided upon initial assessment was as follows: Bipolar I disorder, most recent episode depressed ([place]). This diagnosis remained unchanged through entire hospitalization. The following medication adjustments were initiated on admission:   Bipolar I disorder, most recent episode depressed ([place]) -Start Seroquel 100 mg nightly   Anxiety -Continue Hydroxyzine 25 mg every 6 hours PRN -Continue Buspar 10 mg TID   Tremors -Continue Propranolol ER 60 mg daily -Discontinue Cogentin to 1 mg BID -Start Amantadine 100 mg BID   Insomnia -Continue Trazodone 50 mg nightly   Fibromyalgia/chronic pain syndrome -Continue Oxy IR 20 mg QID (pt understands will not prescribe this medication on discharge) -Continue Robaxin 500 mg BID   Hypothyroidism -Continue Synthroid 50 mcg daily at 0600   Nicotine Addiction -Continue Nicotine lozenges Q 2 H PRN (home med)   During the hospitalization, adjustments were made to the patient's psychiatric medication regimen with the final medication regimen being as  follows:   Medications Bipolar I disorder, most recent episode depressed ([facility]) -Increase Seroquel to 150 mg nightly   Anxiety -Continue Hydroxyzine 25 mg every 6 hours PRN -Continue Buspar 10 mg TID   Tremors -Continue Propranolol ER 60 mg daily -Discontinued Cogentin to 1 mg BID on 2/12 -Continue Amantadine 100 mg BID   Insomnia -Continue Trazodone 50 mg nightly   Fibromyalgia/chronic pain syndrome -Continue Oxy IR 20 mg QID (pt understands will not prescribe this medication on discharge)   -Increase Robaxin to 500 mg to TID (pt understood that this med will not be prescribed on discharge)   Hypothyroidism -Continue Synthroid 50 mcg daily at 0600   Nicotine Addiction -Continue Nicotine lozenges Q 2 H PRN (home med)   Patient's care was discussed during the interdisciplinary team meeting every day during the hospitalization. The patient denies having side effects to prescribed psychiatric medication.  The patient was evaluated each day by a clinical provider to ascertain response to treatment. Improvement was noted by the patient's report of decreasing symptoms, improved sleep and appetite, affect, medication tolerance, behavior, and participation in unit programming.  Patient was asked each day to complete a self inventory noting mood, mental status, pain, new symptoms, anxiety and concerns.   Symptoms were reported as significantly decreased or resolved completely by discharge.  The patient reports that their mood is stable.  The patient denied having suicidal thoughts for more than 48 hours prior to discharge.  Patient denies having homicidal thoughts.  Patient denies having auditory hallucinations.  Patient denies any visual hallucinations or other symptoms of psychosis.    The patient is motivated to continue taking medication with a goal of continued improvement  in mental health. The patient reports their target psychiatric symptoms of bipolar 1 disorder responded well to the  psychiatric medications, and the patient reports overall benefit other psychiatric hospitalization. Supportive psychotherapy was provided to the patient. The patient also participated in regular group therapy while hospitalized. Coping skills, problem solving as well as relaxation therapies were also part of the unit programming.   Labs were reviewed with the patient, and abnormal results were discussed with the patient.  Principal Problem: Bipolar I disorder, most recent episode depressed [facility]) Discharge Diagnoses: Principal Problem:   Bipolar I disorder, most recent episode depressed [facility])  Past Psychiatric History: As above  Past Medical History:  Past Medical History:  Diagnosis Date   Accelerated hypertension 08/23/2016   pt denies this   Anxiety    Arthritis    Bipolar affective disorder ([place])    Chronic back pain    Chronic kidney disease 2017   AKF - due to Rhabdomyolysis   Coronary artery disease    minimal Mid LAD to Dist LAD lesion, 35% stenosed. Prox Cx lesion, 40% stenosed.   DDD (degenerative disc disease)    Depression    DJD (degenerative joint disease)    Elevated liver function tests    Fibromyalgia    GERD (gastroesophageal reflux disease)    Head injury    Head injury, closed, with concussion    x 3  from falls   Hyperlipidemia    Hypothyroidism    IBS (irritable bowel syndrome)    with constapation   Inguinal hernia    left   Insomnia    Myofascial pain syndrome    Osteopenia    Rhabdomyolysis    Shortness of breath dyspnea    with exertion   Tibial plateau fracture, left    Tremors of nervous system     Past Surgical History:  Procedure Laterality Date   ANTERIOR CERVICAL DECOMP/DISCECTOMY FUSION N/A 09/21/2017   Procedure: ANTERIOR CERVICAL DECOMPRESSION/DISCECTOMY FUSION - CERVICAL THREE-CERVICAL FOUR;  Surgeon: Kary Kos, MD;  Location: [place];  Service: Neurosurgery;  Laterality: N/A;   ANTERIOR CERVICAL DECOMP/DISCECTOMY FUSION N/A  12/23/2017   Procedure: REMOVAL AND REPLACEMENT OF CERVICAL THREE-FOUR HARDWARE;  Surgeon: Kary Kos, MD;  Location: [place];  Service: Neurosurgery;  Laterality: N/A;   CARDIAC CATHETERIZATION N/A 08/31/2015   Procedure: Left Heart Cath and Coronary Angiography;  Surgeon: Leonie Man, MD;  Location: [place] [facility];  Service: Cardiovascular;  Laterality: N/A;   Carotid Dopplers Bilateral 02/20/2015   [facility]: Mild, less than 39% left and right internal carotid artery stenosis. No significant plaque burden   CERVICAL DISC SURGERY     C5-7   COLONOSCOPY     COLONOSCOPY     ESOPHAGOGASTRODUODENOSCOPY     FASCIOTOMY Left 10/20/2013   Procedure: LEFT leg ANTERIOR COMPARTMENT FACSCIOTOMY;  Surgeon: Rozanna Box, MD;  Location: [place];  Service: Orthopedics;  Laterality: Left;   INGUINAL HERNIA REPAIR Left    Nuclear Stress Test  03/06/2015   [facility]: Normal EKG. Low normal EF (49%) normal regional wall motion. No evidence of ischemia or infarction.   ORIF TIBIA PLATEAU Left 10/20/2013   Procedure: OPEN REDUCTION INTERNAL FIXATION (ORIF) LEFT TIBIAL PLATEAU;  Surgeon: Rozanna Box, MD;  Location: [place];  Service: Orthopedics;  Laterality: Left;   SEPTOPLASTY  2010   SPINAL CORD STIMULATOR BATTERY EXCHANGE N/A 02/08/2016   Procedure: Lumbar spinal cord stimulator implantable pulse generator replacement;  Surgeon:  Clydell Hakim, MD;  Location: [place] [facility];  Service: Neurosurgery;  Laterality: N/A;   SPINAL CORD STIMULATOR BATTERY EXCHANGE N/A 12/23/2017   Procedure: SPINAL CORD STIMULATOR BATTERY EXCHANGE;  Surgeon: Clydell Hakim, MD;  Location: [place];  Service: Neurosurgery;  Laterality: N/A;   SPINAL CORD STIMULATOR IMPLANT     TOTAL HIP ARTHROPLASTY Right 03/15/2019   Procedure: RIGHT TOTAL HIP ARTHROPLASTY ANTERIOR APPROACH;  Surgeon: Melrose Nakayama, MD;  Location: [facility];  Service: Orthopedics;  Laterality: Right;   TOTAL HIP ARTHROPLASTY Left  03/05/2021   Procedure: LEFT TOTAL HIP ARTHROPLASTY ANTERIOR APPROACH;  Surgeon: Melrose Nakayama, MD;  Location: [facility];  Service: Orthopedics;  Laterality: Left;   TRANSTHORACIC ECHOCARDIOGRAM  02/20/2015   [facility]: Mild concentric LVH. EF 55-60%. Normal regional wall motion. Mild to moderate TR with no significant pulmonary hypertension.   Family History:  Family History  Problem Relation Age of Onset   Heart disease Mother    Hypertension Mother    Sudden death Mother        Presumably cardiac   Hyperlipidemia Father    Heart attack Father 74       At least 5 MIs. Had CABG.   Heart failure Father    Diabetes Father    Hypertension Father    Kidney disease Father    Diabetes Brother    Lung cancer Maternal Grandmother    Diabetes Paternal Grandmother    Sudden death Paternal Grandmother        Unclear etiology   Diabetes Other    Kidney disease Other    Kidney disease Brother    Hypertension Brother    Sudden death Brother        Thought to be related to heart disease   Throat cancer Brother    Colon cancer Neg Hx    Mental illness Neg Hx    Family Psychiatric  History: none provided Social History:  Social History   Substance and Sexual Activity  Alcohol Use No   Alcohol/week: 0.0 standard drinks   Comment: none since 1999- heavy drinker in past     Social History   Substance and Sexual Activity  Drug Use Not Currently   Types: Oxycodone, Cocaine   Comment: rarely- last 2015-    Social History   Socioeconomic History   Marital status: Legally Separated    Spouse name: Not on file   Number of children: 1   Years of education: Not on file   Highest education level: Not on file  Occupational History   Occupation: disabled  Tobacco Use   Smoking status: Every Day    Packs/day: 0.25    Years: 40.00    Pack years: 10.00    Types: Cigarettes   Smokeless tobacco: Never  Vaping Use   Vaping Use: Never used  Substance and Sexual Activity    Alcohol use: No    Alcohol/week: 0.0 standard drinks    Comment: none since 1999- heavy drinker in past   Drug use: Not Currently    Types: Oxycodone, Cocaine    Comment: rarely- last 2015-   Sexual activity: Not on file  Other Topics Concern   Not on file  Social History Narrative   He is a separated father of 1. His daughter is married and recent OT graduate living in [place].   He is currently disabled due to chronic back pain.    He did some college education.   He currently is trying to  use electronic cigarettes but has smoked one pack a day for over 35 years.   He has recently stopped using prescription benzodiazepine and pain medications because he is in the process of changing pain med doctors.   He does not routinely exercise secondary to back, neck pain as well as dyspnea.   Social Determinants of Health   Financial Resource Strain: Not on file  Food Insecurity: Not on file  Transportation Needs: Not on file  Physical Activity: Not on file  Stress: Not on file  Social Connections: Not on file   Physical Findings: AIMS: Facial and Oral Movements Muscles of Facial Expression: None, normal Lips and Perioral Area: None, normal Jaw: None, normal Tongue: None, normal,Extremity Movements Upper (arms, wrists, hands, fingers): Mild Lower (legs, knees, ankles, toes): None, normal, Trunk Movements Neck, shoulders, hips: None, normal, Overall Severity Severity of abnormal movements (highest score from questions above): Mild Incapacitation due to abnormal movements: None, normal Patient's awareness of abnormal movements (rate only patient's report): Aware, no distress, Dental Status Current problems with teeth and/or dentures?: No Does patient usually wear dentures?: No  CIWA:  n/a AIMS-5 COWS: n/a   Musculoskeletal: Strength & Muscle Tone: within normal limits Gait & Station: normal Patient leans: N/A  Psychiatric Specialty Exam:  Presentation  General Appearance:  Appropriate for Environment; Fairly Groomed  Eye Contact:Good  Speech:Clear and Coherent  Speech Volume:Normal  Handedness:Right   Mood and Affect  Mood:Euphoric  Affect:Appropriate  Thought Process  Thought Processes:Coherent  Descriptions of Associations:Intact  Orientation:Full (Time, Place and Person)  Thought Content:Logical  History of Schizophrenia/Schizoaffective disorder:No  Duration of Psychotic Symptoms:N/A  Hallucinations:Hallucinations: None  Ideas of Reference:None  Suicidal Thoughts:Suicidal Thoughts: No  Homicidal Thoughts:Homicidal Thoughts: No  Sensorium  Memory:Immediate Good; Recent Good  Judgment:Good  Insight:Good  Executive Functions  Concentration:Good  Attention Span:Good  [place] of Knowledge:Good  Language:Good  Psychomotor Activity  Psychomotor Activity:Psychomotor Activity: Normal  Assets  Assets:Communication Skills  Sleep  Sleep:Sleep: Good  Physical Exam: Physical Exam Constitutional:      General: He is not in acute distress.    Appearance: Normal appearance.  HENT:     Head: Normocephalic.     Nose: Nose normal. No congestion or rhinorrhea.  Eyes:     Pupils: Pupils are equal, round, and reactive to light.  Pulmonary:     Effort: Pulmonary effort is normal. No respiratory distress.  Musculoskeletal:        General: Normal range of motion.     Cervical back: Normal range of motion. No rigidity.  Neurological:     Mental Status: He is alert and oriented to person, place, and time.     Sensory: No sensory deficit.     Coordination: Coordination normal.  Psychiatric:        Mood and Affect: Mood normal.        Behavior: Behavior normal.        Thought Content: Thought content normal.        Judgment: Judgment normal.   Review of Systems  Constitutional: Negative.  Negative for fever.  HENT: Negative.  Negative for sore throat.   Eyes: Negative.   Cardiovascular: Negative.  Negative for  chest pain.  Musculoskeletal:  Positive for back pain (chronic back pain).  Skin: Negative.   Neurological: Negative.   Psychiatric/Behavioral:  Positive for depression (improving with medications).   Blood pressure 105/80, pulse 99, temperature (!) 97.5 F (36.4 C), temperature source Oral, resp. rate 20, height 6' (  1.829 m), weight 104.3 kg, SpO2 98 %. Body mass index is 31.19 kg/m.   Social History   Tobacco Use  Smoking Status Every Day   Packs/day: 0.25   Years: 40.00   Pack years: 10.00   Types: Cigarettes  Smokeless Tobacco Never   Tobacco Cessation:  A prescription for an FDA-approved tobacco cessation medication provided at discharge. Pt used home supplied lozenges during the admission and was educated to continue using them outside of the hospital.  Blood Alcohol level:  Lab Results  Component Value Date   [facility] <10 07/12/2021   ETH <10 96/29/5284    Metabolic Disorder Labs:  Lab Results  Component Value Date   HGBA1C 5.1 07/14/2021   MPG 99.67 07/14/2021   MPG 85 10/29/2016   Lab Results  Component Value Date   PROLACTIN 27.7 (H) 10/29/2016   Lab Results  Component Value Date   CHOL 176 07/16/2021   TRIG 152 (H) 07/16/2021   HDL 57 07/16/2021   CHOLHDL 3.1 07/16/2021   VLDL 30 07/16/2021   LDLCALC 89 07/16/2021   LDLCALC 159 (H) 10/29/2016   See Psychiatric Specialty Exam and Suicide Risk Assessment completed by Attending Physician prior to discharge.  Discharge destination:  Home  Is patient on multiple antipsychotic therapies at discharge:  No   Has Patient had three or more failed trials of antipsychotic monotherapy by history:  No was on Loxapine on admission and was switched to Seroquel.  Recommended Plan for Multiple Antipsychotic Therapies: NA  Allergies as of 07/16/2021       Reactions   Zolpidem Tartrate Other (See Comments)   Hallucinations and sleep walks   Lyrica [pregabalin] Other (See Comments)   Caused extreme depression    Demerol [meperidine] Itching, Rash, Hives        Medication List     STOP taking these medications    Aspirin Low Dose 81 MG chewable tablet Generic drug: aspirin   azithromycin 250 MG tablet Commonly known as: Zithromax Z-Pak   benztropine 0.5 MG tablet Commonly known as: COGENTIN   divalproex 250 MG DR tablet Commonly known as: DEPAKOTE   esomeprazole 40 MG capsule Commonly known as: [place] Replaced by: pantoprazole 40 MG tablet   Eszopiclone 3 MG Tabs   lamoTRIgine 100 MG tablet Commonly known as: LAMICTAL   loxapine 50 MG capsule Commonly known as: LOXITANE   multivitamin with minerals tablet   nicotine 21 mg/24hr patch Commonly known as: NICODERM CQ - dosed in mg/24 hours   nitroGLYCERIN 0.4 MG SL tablet Commonly known as: Nitrostat   ondansetron 4 MG tablet Commonly known as: ZOFRAN   oseltamivir 75 MG capsule Commonly known as: TAMIFLU   predniSONE 5 MG (21) Tbpk tablet Commonly known as: STERAPRED UNI-PAK 21 TAB   Repatha SureClick 132 MG/ML [place] Generic drug: Evolocumab   vitamin C 1000 MG tablet   Vitamin D (Ergocalciferol) 1.25 MG (50000 UNIT) Caps capsule Commonly known as: DRISDOL   Vitamin D 50 MCG (2000 UT) tablet   Zinc 50 MG Tabs       TAKE these medications      Indication  amantadine 100 MG capsule Commonly known as: SYMMETREL Take 1 capsule (100 mg total) by mouth 2 (two) times daily.  Indication: Extrapyramidal Reaction caused by Medications   busPIRone 10 MG tablet Commonly known as: BUSPAR Take 1 tablet by mouth 3 times daily. What changed: Another medication with the same name was removed. Continue taking this medication, and follow  the directions you see here.    hydrOXYzine 25 MG tablet Commonly known as: ATARAX Take 1 tablet (25 mg total) by mouth 3 (three) times daily as needed for anxiety. What changed:  how much to take how to take this when to take this reasons to take this  Indication: Feeling  Anxious   levothyroxine 50 MCG tablet Commonly known as: SYNTHROID Take 1 tablet (50 mcg total) by mouth daily in the morning before breakfast. Start taking on: July 17, 2021 What changed: when to take this  Indication: Underactive Thyroid   methocarbamol 500 MG tablet Commonly known as: ROBAXIN Take 1 tablet by mouth every six hours as needed for pain and spasm What changed: Another medication with the same name was removed. Continue taking this medication, and follow the directions you see here.  Indication: Musculoskeletal Pain   naloxone 4 MG/0.1ML Liqd nasal spray kit Commonly known as: Narcan Use 1 spray in nose as directed  Indication: Opioid Overdose   nicotine polacrilex 4 MG lozenge Commonly known as: COMMIT Take 4 mg by mouth as needed for smoking cessation. What changed: Another medication with the same name was changed. Make sure you understand how and when to take each.  Indication: Nicotine Addiction   nicotine polacrilex 4 MG lozenge Commonly known as: SM Nicotine Polacrilex Take 1 lozenge (4 mg total) by mouth every 2 (two) hours as needed for smoking cessation. What changed:  how much to take when to take this reasons to take this  Indication: Nicotine Addiction   Oxycodone HCl 20 MG Tabs Take 1 Tablet by mouth four times daily What changed: Another medication with the same name was removed. Continue taking this medication, and follow the directions you see here.  Indication: Acute Pain, Chronic Pain   pantoprazole 40 MG tablet Commonly known as: PROTONIX Take 1 tablet (40 mg total) by mouth daily. Start taking on: July 17, 2021 Replaces: esomeprazole 40 MG capsule  Indication: Heartburn   propranolol ER 60 MG 24 hr capsule Commonly known as: INDERAL LA Take 1 capsule (60 mg total) by mouth daily. Start taking on: July 17, 2021 What changed:  how much to take when to take this Another medication with the same name was removed.  Continue taking this medication, and follow the directions you see here.  Indication: Feeling Anxious   QUEtiapine Fumarate 150 MG 24 hr tablet Commonly known as: SEROQUEL XR Take 1 tablet (150 mg total) by mouth at bedtime.  Indication: Manic-Depression, Major Depressive Disorder   traZODone 50 MG tablet Commonly known as: DESYREL Take 1 tablet (50 mg total) by mouth at bedtime. What changed:  medication strength how much to take when to take this  Indication: [place]. Go on 07/24/2021.   Why: You have a hospital follow up appointment for therapy and medication management services on  07/24/21 at 9:00 am.  This appointment will be held in person (afterwards appts may be Virtual).  Please enter through the front entrance. Contact information: [place] [address] [phone]                Follow-up recommendations:   The patient is able to verbalize their individual safety plan to this provider.   # It is recommended to the patient to continue psychiatric medications as prescribed, after discharge from the hospital.     # It is  recommended to the patient to follow up with your outpatient psychiatric provider and PCP.   # It was discussed with the patient, the impact of alcohol, drugs, tobacco have been there overall psychiatric and medical wellbeing, and total abstinence from substance use was recommended the patient.ed.   # Prescriptions provided or sent directly to preferred pharmacy at discharge. Patient agreeable to plan. Given opportunity to ask questions. Appears to feel comfortable with discharge.    # In the event of worsening symptoms, the patient is instructed to call the crisis hotline, 911 and or go to the nearest ED for appropriate evaluation and treatment of symptoms. To follow-up with primary care provider for other medical issues, concerns and or health care needs    # Patient was discharged home with a plan to follow up as noted above.   Signed: Nicholes Rough, NP 07/16/2021, 2:51 PM

## 2021-07-16 NOTE — Progress Notes (Signed)
°  [facility] Adult Case Management Discharge Plan :  Will you be returning to the same living situation after discharge:  Yes,  Home At discharge, do you have transportation home?: Yes,  Wife  Do you have the ability to pay for your medications: Yes,  Medicaid  Release of information consent forms completed and in the chart;  Patient's signature needed at discharge.  Patient to Follow up at:  [place], [facility]. Go on 07/24/2021.   Why: You have a hospital follow up appointment for therapy and medication management services on  07/24/21 at 9:00 am.  This appointment will be held in person (afterwards appts may be Virtual).  Please enter through the front entrance. Contact information: [place] [address] [phone]                 Next level of care provider has access to [place] and Suicide Prevention discussed: Yes,  with patient and wife      Has patient been referred to the Quitline?: Patient refused referral  Patient has been referred for addiction treatment: N/A  Darleen Crocker, [place] 07/16/2021, 10:17 AM

## 2021-07-16 NOTE — Progress Notes (Signed)
RN met with pt and reviewed pt's discharge instructions. Pt verbalized understanding of discharge instructions and pt did not have any questions. RN reviewed and provided pt with a copy of SRA, AVS and Transition Record. RN returned pt's belongings to pt. Pt denied SI/HI/AVH and voiced no concerns. Pt was appreciative of the care pt received at [facility]. Patient discharged to the lobby without incident.

## 2021-07-16 NOTE — Group Note (Signed)
Date:  07/16/2021 Time:  11:21 AM  Group Topic/Focus:  Orientation:   The focus of this group is to educate the patient on the purpose and policies of crisis stabilization and provide a format to answer questions about their admission.  The group details unit policies and expectations of patients while admitted.    Participation Level:  Active  Participation Quality:  Appropriate  Affect:  Appropriate  Cognitive:  Appropriate  Insight: Appropriate  Engagement in Group:  Engaged  Modes of Intervention:  Discussion  Additional Comments:     Jerrye Beavers 07/16/2021, 11:21 AM

## 2021-07-26 ENCOUNTER — Other Ambulatory Visit (HOSPITAL_COMMUNITY): Payer: Self-pay

## 2021-07-29 ENCOUNTER — Other Ambulatory Visit (HOSPITAL_COMMUNITY): Payer: Self-pay

## 2021-07-30 ENCOUNTER — Other Ambulatory Visit (HOSPITAL_COMMUNITY): Payer: Self-pay

## 2021-08-01 ENCOUNTER — Other Ambulatory Visit (HOSPITAL_COMMUNITY): Payer: Self-pay

## 2021-08-01 MED ORDER — TRAZODONE HCL 50 MG PO TABS
ORAL_TABLET | ORAL | 2 refills | Status: DC
  Filled 2021-08-01 – 2021-11-08 (×2): qty 30, 30d supply, fill #0

## 2021-08-01 MED ORDER — QUETIAPINE FUMARATE ER 200 MG PO TB24
ORAL_TABLET | ORAL | 2 refills | Status: DC
  Filled 2021-08-01: qty 30, 30d supply, fill #0
  Filled 2021-08-31: qty 30, 30d supply, fill #1

## 2021-08-01 MED ORDER — BUSPIRONE HCL 10 MG PO TABS
ORAL_TABLET | ORAL | 2 refills | Status: DC
  Filled 2021-08-01: qty 90, 30d supply, fill #0

## 2021-08-01 MED ORDER — HYDROXYZINE HCL 25 MG PO TABS
ORAL_TABLET | ORAL | 2 refills | Status: DC
  Filled 2021-08-01: qty 90, 30d supply, fill #0

## 2021-08-02 ENCOUNTER — Other Ambulatory Visit (HOSPITAL_COMMUNITY): Payer: Self-pay

## 2021-08-08 ENCOUNTER — Other Ambulatory Visit (HOSPITAL_COMMUNITY): Payer: Self-pay

## 2021-08-08 MED ORDER — HYDROXYZINE HCL 25 MG PO TABS
ORAL_TABLET | ORAL | 3 refills | Status: DC
  Filled 2021-08-08 – 2021-08-31 (×2): qty 90, 30d supply, fill #0
  Filled 2021-10-25: qty 90, 30d supply, fill #1

## 2021-08-08 MED ORDER — TRAZODONE HCL 50 MG PO TABS
ORAL_TABLET | ORAL | 3 refills | Status: DC
  Filled 2021-08-08: qty 90, 90d supply, fill #0

## 2021-08-08 MED ORDER — PROPRANOLOL HCL ER 60 MG PO CP24
ORAL_CAPSULE | ORAL | 3 refills | Status: DC
  Filled 2021-08-08: qty 90, 90d supply, fill #0
  Filled 2021-11-08: qty 90, 90d supply, fill #1
  Filled 2022-01-31: qty 90, 90d supply, fill #2

## 2021-08-08 MED ORDER — ESOMEPRAZOLE MAGNESIUM 40 MG PO CPDR
DELAYED_RELEASE_CAPSULE | ORAL | 1 refills | Status: DC
  Filled 2021-08-08: qty 90, 90d supply, fill #0
  Filled 2021-11-03: qty 90, 90d supply, fill #1

## 2021-08-08 MED ORDER — OXYCODONE HCL 20 MG PO TABS
1.0000 | ORAL_TABLET | Freq: Four times a day (QID) | ORAL | 0 refills | Status: DC
  Filled 2021-08-08 – 2021-08-12 (×2): qty 120, 30d supply, fill #0

## 2021-08-08 MED ORDER — LEVOTHYROXINE SODIUM 50 MCG PO TABS
50.0000 ug | ORAL_TABLET | Freq: Every day | ORAL | 1 refills | Status: DC
  Filled 2021-08-08 – 2021-08-31 (×2): qty 90, 90d supply, fill #0
  Filled 2021-11-24: qty 90, 90d supply, fill #1

## 2021-08-08 MED ORDER — METHOCARBAMOL 750 MG PO TABS
ORAL_TABLET | ORAL | 6 refills | Status: DC
  Filled 2021-08-08: qty 120, 30d supply, fill #0
  Filled 2021-09-03: qty 120, 30d supply, fill #1
  Filled 2021-09-28: qty 120, 30d supply, fill #2
  Filled 2021-10-28: qty 120, 30d supply, fill #3
  Filled 2021-11-24: qty 120, 30d supply, fill #4
  Filled 2021-12-21: qty 120, 30d supply, fill #5
  Filled 2022-01-31: qty 120, 30d supply, fill #6

## 2021-08-08 MED ORDER — ONDANSETRON HCL 4 MG PO TABS
ORAL_TABLET | ORAL | 0 refills | Status: DC
  Filled 2021-08-08: qty 120, 30d supply, fill #0

## 2021-08-08 MED ORDER — REPATHA SURECLICK 140 MG/ML ~~LOC~~ SOAJ
SUBCUTANEOUS | 12 refills | Status: DC
  Filled 2021-08-08: qty 2, 30d supply, fill #0

## 2021-08-08 MED ORDER — QUETIAPINE FUMARATE ER 150 MG PO TB24
ORAL_TABLET | ORAL | 0 refills | Status: DC
  Filled 2021-08-08: qty 30, 30d supply, fill #0

## 2021-08-08 MED ORDER — BUSPIRONE HCL 10 MG PO TABS
ORAL_TABLET | ORAL | 6 refills | Status: DC
  Filled 2021-08-08: qty 90, 30d supply, fill #0
  Filled 2021-09-20: qty 90, 30d supply, fill #1

## 2021-08-08 MED ORDER — AMANTADINE HCL 100 MG PO TABS
ORAL_TABLET | ORAL | 0 refills | Status: DC
  Filled 2021-08-08: qty 60, 30d supply, fill #0

## 2021-08-09 ENCOUNTER — Other Ambulatory Visit (HOSPITAL_COMMUNITY): Payer: Self-pay

## 2021-08-12 ENCOUNTER — Other Ambulatory Visit (HOSPITAL_COMMUNITY): Payer: Self-pay

## 2021-08-24 ENCOUNTER — Other Ambulatory Visit (HOSPITAL_COMMUNITY): Payer: Self-pay

## 2021-08-30 ENCOUNTER — Other Ambulatory Visit (HOSPITAL_COMMUNITY): Payer: Self-pay

## 2021-08-31 ENCOUNTER — Other Ambulatory Visit (HOSPITAL_COMMUNITY): Payer: Self-pay

## 2021-09-04 ENCOUNTER — Other Ambulatory Visit (HOSPITAL_COMMUNITY): Payer: Self-pay

## 2021-09-05 ENCOUNTER — Other Ambulatory Visit (HOSPITAL_COMMUNITY): Payer: Self-pay

## 2021-09-05 MED ORDER — OXYCODONE HCL 20 MG PO TABS
1.0000 | ORAL_TABLET | Freq: Four times a day (QID) | ORAL | 0 refills | Status: DC
  Filled 2021-09-05: qty 120, 30d supply, fill #0

## 2021-09-05 MED ORDER — AMANTADINE HCL 100 MG PO TABS
ORAL_TABLET | ORAL | 0 refills | Status: DC
  Filled 2021-09-05: qty 60, 30d supply, fill #0

## 2021-09-05 MED ORDER — REPATHA SURECLICK 140 MG/ML ~~LOC~~ SOAJ
SUBCUTANEOUS | 12 refills | Status: DC
  Filled 2021-09-05: qty 2, 30d supply, fill #0

## 2021-09-06 ENCOUNTER — Other Ambulatory Visit (HOSPITAL_COMMUNITY): Payer: Self-pay

## 2021-09-07 ENCOUNTER — Other Ambulatory Visit (HOSPITAL_COMMUNITY): Payer: Self-pay

## 2021-09-09 ENCOUNTER — Other Ambulatory Visit (HOSPITAL_COMMUNITY): Payer: Self-pay

## 2021-09-12 ENCOUNTER — Other Ambulatory Visit (HOSPITAL_COMMUNITY): Payer: Self-pay

## 2021-09-20 ENCOUNTER — Other Ambulatory Visit (HOSPITAL_COMMUNITY): Payer: Self-pay

## 2021-09-25 ENCOUNTER — Other Ambulatory Visit (HOSPITAL_COMMUNITY): Payer: Self-pay

## 2021-09-26 ENCOUNTER — Other Ambulatory Visit (HOSPITAL_COMMUNITY): Payer: Self-pay

## 2021-09-26 MED ORDER — DULOXETINE HCL 30 MG PO CPEP
30.0000 mg | ORAL_CAPSULE | Freq: Every morning | ORAL | 0 refills | Status: DC
  Filled 2021-09-26: qty 60, 30d supply, fill #0

## 2021-09-26 MED ORDER — HYDROXYZINE HCL 25 MG PO TABS
25.0000 mg | ORAL_TABLET | Freq: Three times a day (TID) | ORAL | 3 refills | Status: DC | PRN
  Filled 2021-09-26: qty 90, 30d supply, fill #0
  Filled 2021-11-24: qty 90, 30d supply, fill #1

## 2021-09-26 MED ORDER — BUSPIRONE HCL 10 MG PO TABS
10.0000 mg | ORAL_TABLET | Freq: Three times a day (TID) | ORAL | 3 refills | Status: DC
  Filled 2021-09-26 – 2021-10-25 (×2): qty 90, 30d supply, fill #0
  Filled 2021-11-24: qty 90, 30d supply, fill #1
  Filled 2022-02-09: qty 90, 30d supply, fill #2

## 2021-09-26 MED ORDER — QUETIAPINE FUMARATE ER 200 MG PO TB24
ORAL_TABLET | ORAL | 3 refills | Status: DC
  Filled 2021-09-26 – 2021-10-10 (×2): qty 30, 30d supply, fill #0

## 2021-09-26 MED ORDER — DULOXETINE HCL 60 MG PO CPEP
60.0000 mg | ORAL_CAPSULE | Freq: Every day | ORAL | 2 refills | Status: DC
  Filled 2021-09-26: qty 60, 60d supply, fill #0
  Filled 2021-10-25: qty 30, 30d supply, fill #0
  Filled 2021-10-28: qty 60, 60d supply, fill #0

## 2021-09-26 MED ORDER — TRAZODONE HCL 50 MG PO TABS
50.0000 mg | ORAL_TABLET | Freq: Every evening | ORAL | 3 refills | Status: DC
  Filled 2021-09-26 – 2021-11-08 (×2): qty 30, 30d supply, fill #0
  Filled 2021-12-21: qty 30, 30d supply, fill #1

## 2021-09-30 ENCOUNTER — Other Ambulatory Visit (HOSPITAL_COMMUNITY): Payer: Self-pay

## 2021-10-01 ENCOUNTER — Other Ambulatory Visit (HOSPITAL_COMMUNITY): Payer: Self-pay

## 2021-10-04 ENCOUNTER — Other Ambulatory Visit (HOSPITAL_COMMUNITY): Payer: Self-pay

## 2021-10-04 MED ORDER — OXYCODONE HCL 20 MG PO TABS
1.0000 | ORAL_TABLET | Freq: Four times a day (QID) | ORAL | 0 refills | Status: DC
  Filled 2021-10-04: qty 120, 30d supply, fill #0
  Filled 2021-10-10: qty 60, 15d supply, fill #0
  Filled 2021-10-10: qty 120, 30d supply, fill #0
  Filled 2021-10-11: qty 60, 15d supply, fill #0
  Filled 2021-10-11: qty 120, 30d supply, fill #0

## 2021-10-04 MED ORDER — REPATHA SURECLICK 140 MG/ML ~~LOC~~ SOAJ
SUBCUTANEOUS | 12 refills | Status: DC
  Filled 2021-10-04 – 2021-10-09 (×2): qty 2, 28d supply, fill #0

## 2021-10-06 ENCOUNTER — Other Ambulatory Visit (HOSPITAL_COMMUNITY): Payer: Self-pay

## 2021-10-07 ENCOUNTER — Other Ambulatory Visit (HOSPITAL_COMMUNITY): Payer: Self-pay

## 2021-10-07 MED ORDER — AMANTADINE HCL 100 MG PO CAPS
ORAL_CAPSULE | ORAL | 0 refills | Status: DC
  Filled 2021-10-07: qty 60, 30d supply, fill #0

## 2021-10-08 ENCOUNTER — Other Ambulatory Visit (HOSPITAL_COMMUNITY): Payer: Self-pay

## 2021-10-08 MED ORDER — QUETIAPINE FUMARATE ER 300 MG PO TB24
ORAL_TABLET | ORAL | 3 refills | Status: DC
  Filled 2021-10-08: qty 30, 30d supply, fill #0
  Filled 2021-11-08: qty 30, 30d supply, fill #1

## 2021-10-09 ENCOUNTER — Other Ambulatory Visit (HOSPITAL_COMMUNITY): Payer: Self-pay

## 2021-10-10 ENCOUNTER — Other Ambulatory Visit (HOSPITAL_COMMUNITY): Payer: Self-pay

## 2021-10-11 ENCOUNTER — Other Ambulatory Visit (HOSPITAL_COMMUNITY): Payer: Self-pay

## 2021-10-12 ENCOUNTER — Other Ambulatory Visit (HOSPITAL_COMMUNITY): Payer: Self-pay

## 2021-10-14 ENCOUNTER — Other Ambulatory Visit (HOSPITAL_COMMUNITY): Payer: Self-pay

## 2021-10-25 ENCOUNTER — Other Ambulatory Visit (HOSPITAL_COMMUNITY): Payer: Self-pay

## 2021-10-26 ENCOUNTER — Other Ambulatory Visit (HOSPITAL_COMMUNITY): Payer: Self-pay

## 2021-10-29 ENCOUNTER — Other Ambulatory Visit (HOSPITAL_COMMUNITY): Payer: Self-pay

## 2021-10-30 ENCOUNTER — Other Ambulatory Visit (HOSPITAL_COMMUNITY): Payer: Self-pay

## 2021-11-04 ENCOUNTER — Other Ambulatory Visit (HOSPITAL_COMMUNITY): Payer: Self-pay

## 2021-11-05 ENCOUNTER — Other Ambulatory Visit (HOSPITAL_COMMUNITY): Payer: Self-pay

## 2021-11-05 MED ORDER — REPATHA SURECLICK 140 MG/ML ~~LOC~~ SOAJ
SUBCUTANEOUS | 12 refills | Status: DC
  Filled 2021-11-05: qty 2, 28d supply, fill #0

## 2021-11-05 MED ORDER — OXYCODONE HCL 20 MG PO TABS
1.0000 | ORAL_TABLET | Freq: Four times a day (QID) | ORAL | 0 refills | Status: DC
  Filled 2021-11-05 – 2021-11-08 (×4): qty 120, 30d supply, fill #0

## 2021-11-07 ENCOUNTER — Other Ambulatory Visit (HOSPITAL_COMMUNITY): Payer: Self-pay

## 2021-11-08 ENCOUNTER — Other Ambulatory Visit (HOSPITAL_COMMUNITY): Payer: Self-pay

## 2021-11-11 ENCOUNTER — Other Ambulatory Visit (HOSPITAL_COMMUNITY): Payer: Self-pay

## 2021-11-12 ENCOUNTER — Other Ambulatory Visit (HOSPITAL_COMMUNITY): Payer: Self-pay

## 2021-11-13 ENCOUNTER — Other Ambulatory Visit (HOSPITAL_COMMUNITY): Payer: Self-pay

## 2021-11-14 ENCOUNTER — Other Ambulatory Visit (HOSPITAL_COMMUNITY): Payer: Self-pay

## 2021-11-14 MED ORDER — FAMOTIDINE 20 MG PO TABS
ORAL_TABLET | ORAL | 1 refills | Status: DC
  Filled 2021-11-14: qty 90, 45d supply, fill #0
  Filled 2021-12-24: qty 90, 45d supply, fill #1

## 2021-11-25 ENCOUNTER — Other Ambulatory Visit (HOSPITAL_COMMUNITY): Payer: Self-pay

## 2021-11-26 ENCOUNTER — Other Ambulatory Visit (HOSPITAL_COMMUNITY): Payer: Self-pay

## 2021-12-05 ENCOUNTER — Other Ambulatory Visit (HOSPITAL_COMMUNITY): Payer: Self-pay

## 2021-12-05 MED ORDER — HYDROXYZINE HCL 25 MG PO TABS
ORAL_TABLET | ORAL | 3 refills | Status: DC
  Filled 2021-12-05: qty 90, 30d supply, fill #0
  Filled 2022-01-31: qty 90, 30d supply, fill #1

## 2021-12-05 MED ORDER — OXYCODONE HCL 20 MG PO TABS
1.0000 | ORAL_TABLET | Freq: Four times a day (QID) | ORAL | 0 refills | Status: DC
  Filled 2021-12-05 – 2021-12-06 (×2): qty 120, 30d supply, fill #0

## 2021-12-05 MED ORDER — BUSPIRONE HCL 10 MG PO TABS
ORAL_TABLET | ORAL | 3 refills | Status: DC
Start: 2021-12-05 — End: 2023-09-15
  Filled 2021-12-05: qty 90, 30d supply, fill #0

## 2021-12-05 MED ORDER — VITAMIN D (ERGOCALCIFEROL) 1.25 MG (50000 UNIT) PO CAPS
ORAL_CAPSULE | ORAL | 1 refills | Status: DC
  Filled 2021-12-05: qty 4, 28d supply, fill #0
  Filled 2022-01-01: qty 4, 28d supply, fill #1

## 2021-12-05 MED ORDER — QUETIAPINE FUMARATE ER 400 MG PO TB24
ORAL_TABLET | ORAL | 3 refills | Status: DC
  Filled 2021-12-05: qty 30, 30d supply, fill #0
  Filled 2022-01-01: qty 30, 30d supply, fill #1
  Filled 2022-01-31: qty 30, 30d supply, fill #2

## 2021-12-05 MED ORDER — DULOXETINE HCL 40 MG PO CPEP
ORAL_CAPSULE | ORAL | 3 refills | Status: DC
  Filled 2021-12-05: qty 30, 30d supply, fill #0
  Filled 2022-01-01: qty 30, 30d supply, fill #1
  Filled 2022-01-31: qty 30, 30d supply, fill #2

## 2021-12-05 MED ORDER — AMANTADINE HCL 100 MG PO CAPS
ORAL_CAPSULE | ORAL | 0 refills | Status: DC
  Filled 2021-12-05: qty 180, 90d supply, fill #0

## 2021-12-05 MED ORDER — TRAZODONE HCL 50 MG PO TABS
ORAL_TABLET | ORAL | 3 refills | Status: DC
  Filled 2021-12-05: qty 30, 30d supply, fill #0
  Filled 2022-01-01: qty 30, 30d supply, fill #1
  Filled 2022-01-31: qty 30, 30d supply, fill #2

## 2021-12-05 MED ORDER — REPATHA SURECLICK 140 MG/ML ~~LOC~~ SOAJ
SUBCUTANEOUS | 12 refills | Status: DC
  Filled 2021-12-05 – 2021-12-13 (×2): qty 2, 28d supply, fill #0

## 2021-12-06 ENCOUNTER — Other Ambulatory Visit (HOSPITAL_COMMUNITY): Payer: Self-pay

## 2021-12-07 ENCOUNTER — Other Ambulatory Visit (HOSPITAL_COMMUNITY): Payer: Self-pay

## 2021-12-10 ENCOUNTER — Other Ambulatory Visit (HOSPITAL_COMMUNITY): Payer: Self-pay

## 2021-12-13 ENCOUNTER — Other Ambulatory Visit (HOSPITAL_COMMUNITY): Payer: Self-pay

## 2021-12-17 IMAGING — CT CT ABD-PELV W/ CM
2 of 5 series · 16 of 46 positions shown, 18 images · IV contrast (iopamidol)
Comparison: September 14, 2020

CLINICAL DATA: Abdominal pain with history of inguinal hernia.

EXAM:
CT ABDOMEN AND PELVIS WITH CONTRAST
TECHNIQUE: Multidetector CT imaging of the abdomen and pelvis was performed
using the standard protocol following bolus administration of
intravenous contrast.
CONTRAST:  100mL ZJD0YU-WBB IOPAMIDOL (ZJD0YU-WBB) INJECTION 61%

[Series 3: abd pelvis 5.00 br40 s3 axial · axial · 0.88mm/px · z∈[+1169,+1634]mm · 13 of 107 slices shown, 15 images]
[im 7/107  soft-tissue]
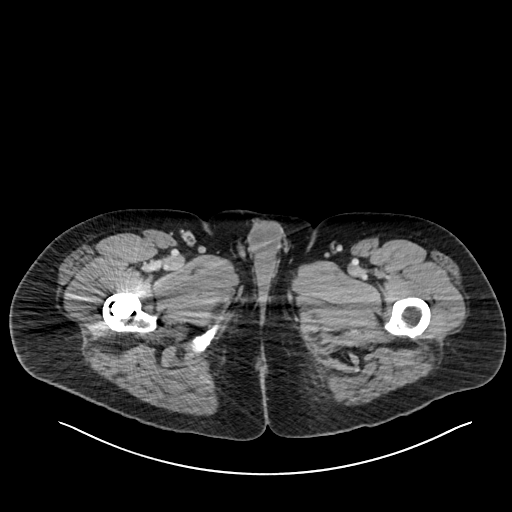
[im 7/107  bone]
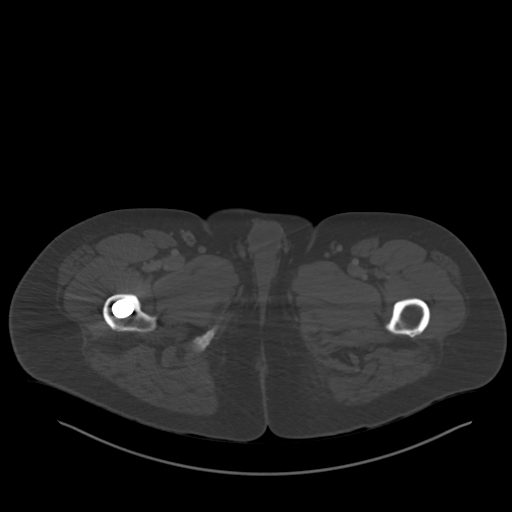
[im 13/107  soft-tissue]
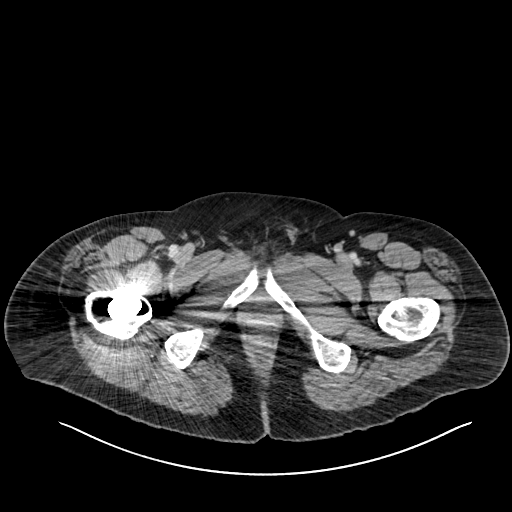
[im 25/107  soft-tissue]
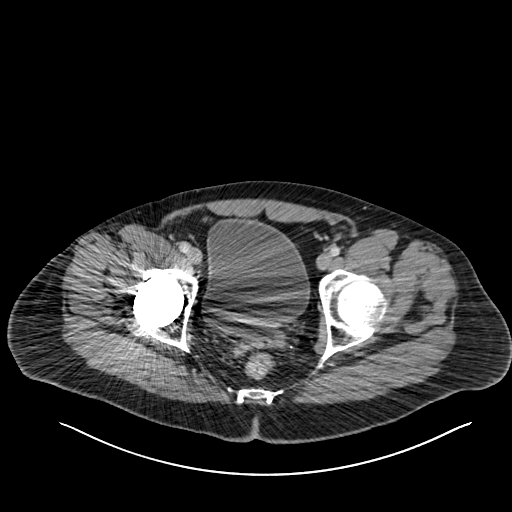
[im 32/107  soft-tissue]
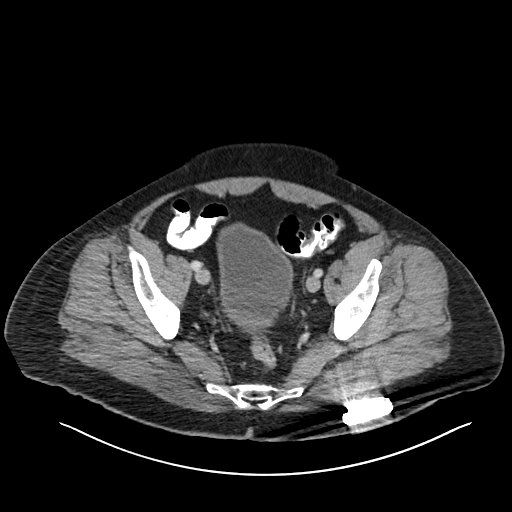
[im 38/107  soft-tissue]
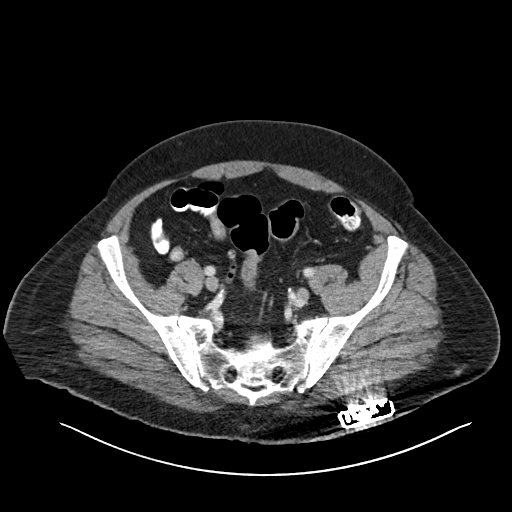
[im 44/107  soft-tissue]
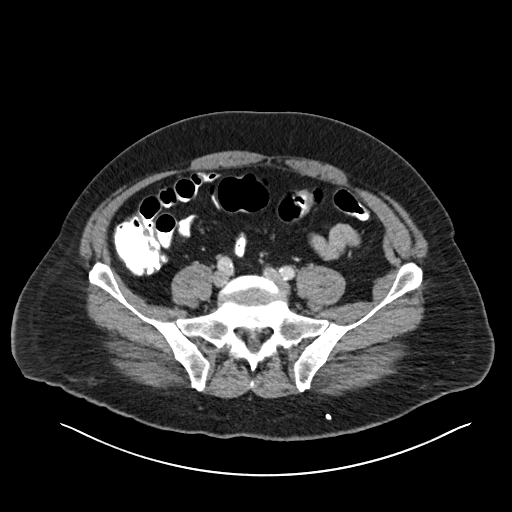
[im 57/107  soft-tissue]
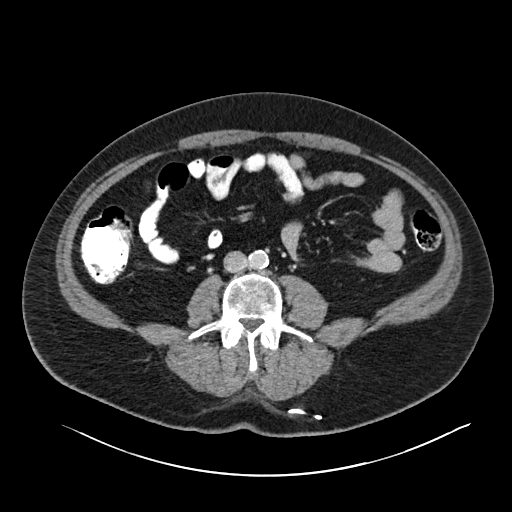
[im 63/107  soft-tissue]
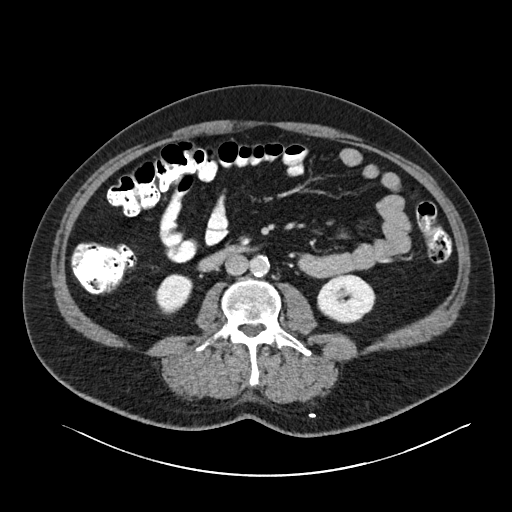
[im 69/107  soft-tissue]
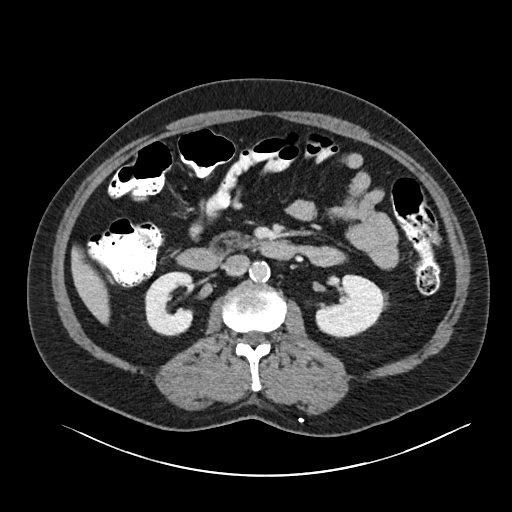
[im 69/107  bone]
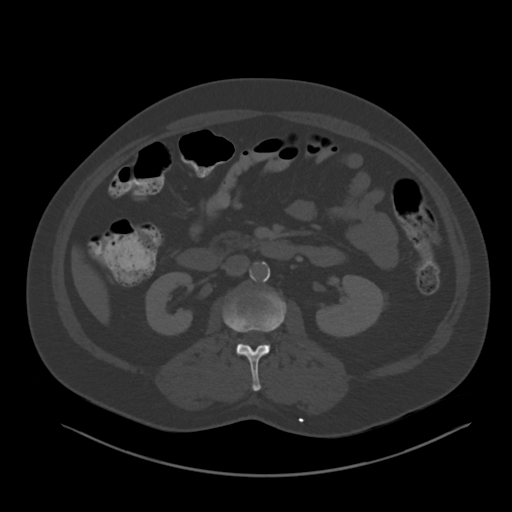
[im 75/107  soft-tissue]
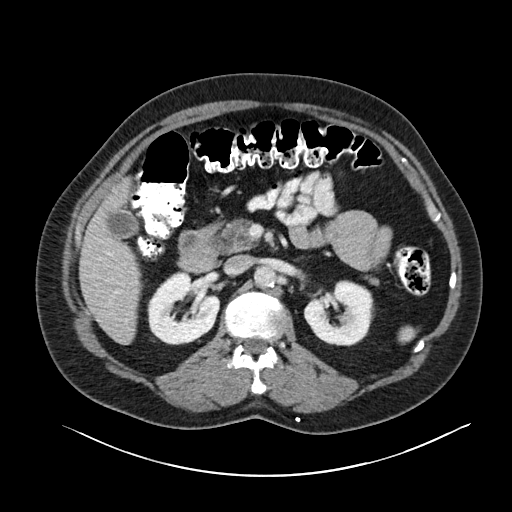
[im 82/107  soft-tissue]
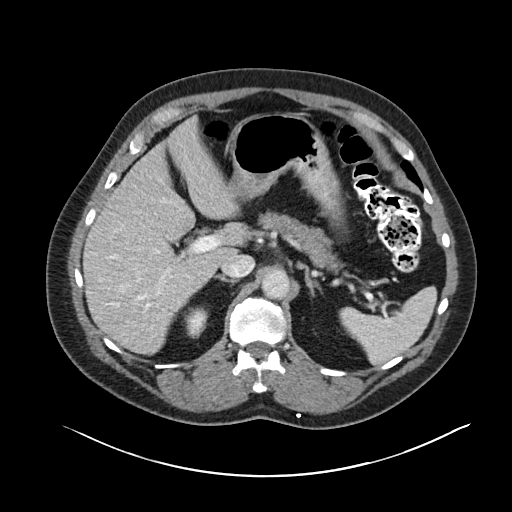
[im 94/107  soft-tissue]
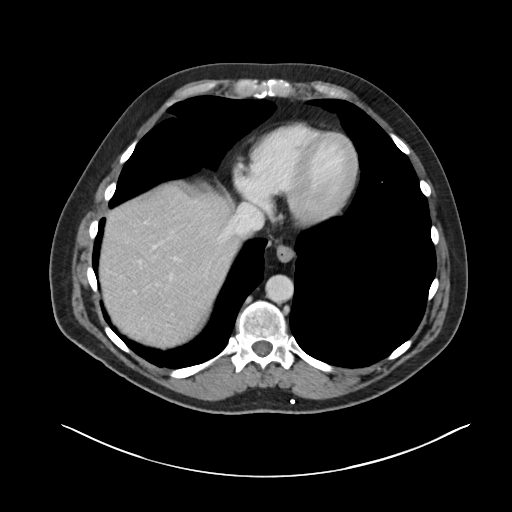
[im 100/107  soft-tissue]
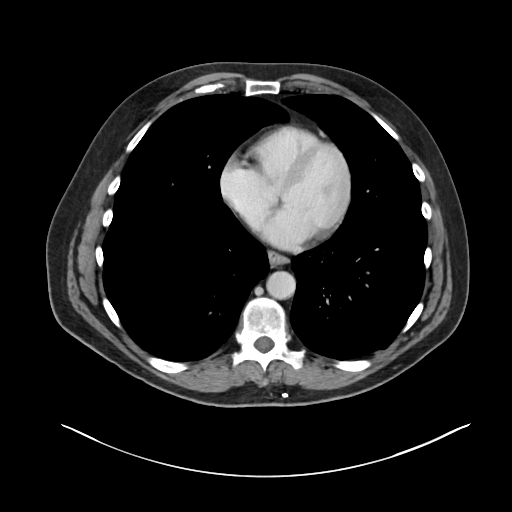

[Series 7: abd pelvis 2.00 br40 s3 cor · coronal · 0.88mm/px · 3 of 226 slices shown]
[im 76/226  soft-tissue]
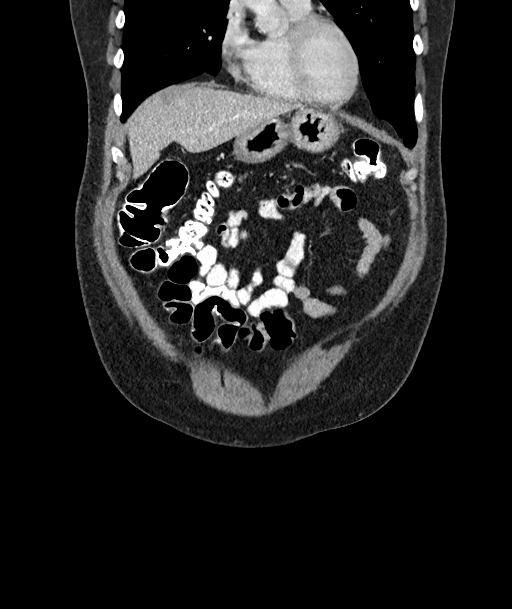
[im 101/226  soft-tissue]
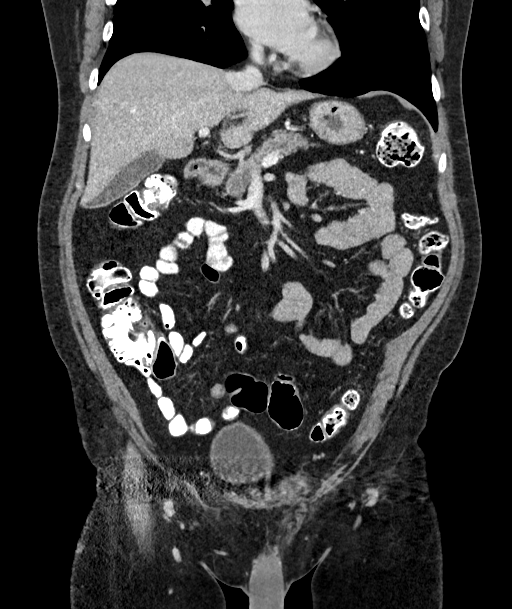
[im 126/226  soft-tissue]
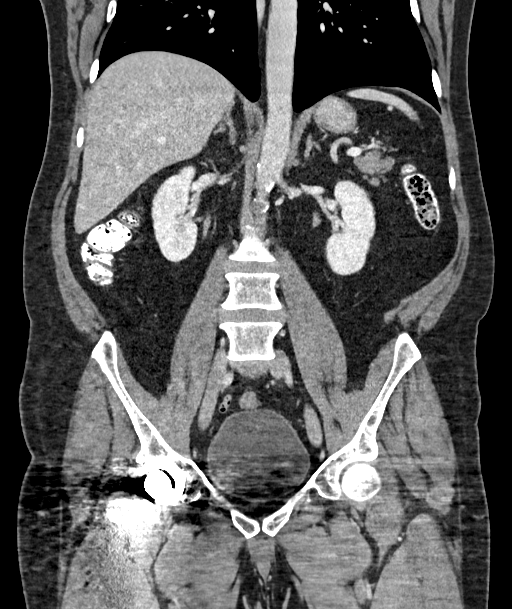

[16 of 46 positions shown; findings below may reference images not displayed]

FINDINGS: Lower chest: No acute abnormality.

Hepatobiliary: There is mild, diffuse fatty infiltration of the
liver. A stable 8 mm focus of parenchymal low attenuation is seen
within the right lobe of the liver (axial CT image 22, CT series
number 3). No gallstones, gallbladder wall thickening, or biliary
dilatation.

Pancreas: Unremarkable. No pancreatic ductal dilatation or
surrounding inflammatory changes.

Spleen: Normal in size without focal abnormality.

Adrenals/Urinary Tract: Adrenal glands are unremarkable. Kidneys are
normal, without renal calculi or hydronephrosis. A stable 2.2 cm x
1.7 cm cyst is seen within the mid right kidney (approximately
Hounsfield units). Bladder is limited in evaluation secondary to
overlying streak artifact and is otherwise unremarkable.

Stomach/Bowel: Stomach is within normal limits. Appendix appears
normal. No evidence of bowel wall thickening, distention, or
inflammatory changes.

Vascular/Lymphatic: Aortic atherosclerosis. No enlarged abdominal or
pelvic lymph nodes.

Reproductive: Prostate is unremarkable.

Other: No abdominal wall hernia or abnormality. No abdominopelvic
ascites.

Musculoskeletal: A total right hip replacement is seen with
associated streak artifact and subsequently limited evaluation of
the adjacent osseous structures. No acute osseous abnormalities are
identified.
IMPRESSION: 1. Fatty liver with a small, stable hepatic cyst versus hemangioma.
2. Stable simple right renal cyst.
3. Total right hip replacement.

## 2021-12-21 ENCOUNTER — Other Ambulatory Visit (HOSPITAL_COMMUNITY): Payer: Self-pay

## 2021-12-23 ENCOUNTER — Other Ambulatory Visit (HOSPITAL_COMMUNITY): Payer: Self-pay

## 2021-12-24 ENCOUNTER — Other Ambulatory Visit (HOSPITAL_COMMUNITY): Payer: Self-pay

## 2021-12-25 ENCOUNTER — Other Ambulatory Visit (HOSPITAL_COMMUNITY): Payer: Self-pay

## 2022-01-01 ENCOUNTER — Other Ambulatory Visit (HOSPITAL_COMMUNITY): Payer: Self-pay

## 2022-01-02 ENCOUNTER — Other Ambulatory Visit (HOSPITAL_COMMUNITY): Payer: Self-pay

## 2022-01-06 ENCOUNTER — Other Ambulatory Visit (HOSPITAL_COMMUNITY): Payer: Self-pay

## 2022-01-06 MED ORDER — REPATHA SURECLICK 140 MG/ML ~~LOC~~ SOAJ
SUBCUTANEOUS | 12 refills | Status: DC
  Filled 2022-01-06: qty 2, 30d supply, fill #0
  Filled 2022-01-31: qty 2, 28d supply, fill #0

## 2022-01-06 MED ORDER — OXYCODONE HCL 20 MG PO TABS
1.0000 | ORAL_TABLET | Freq: Four times a day (QID) | ORAL | 0 refills | Status: DC
  Filled 2022-01-06: qty 120, 30d supply, fill #0

## 2022-01-13 ENCOUNTER — Other Ambulatory Visit (HOSPITAL_COMMUNITY): Payer: Self-pay

## 2022-01-14 ENCOUNTER — Other Ambulatory Visit (HOSPITAL_COMMUNITY): Payer: Self-pay

## 2022-01-23 ENCOUNTER — Other Ambulatory Visit (HOSPITAL_COMMUNITY): Payer: Self-pay

## 2022-01-23 MED ORDER — DICYCLOMINE HCL 10 MG PO CAPS
ORAL_CAPSULE | ORAL | 2 refills | Status: DC
  Filled 2022-01-23: qty 90, 30d supply, fill #0
  Filled 2022-02-28 – 2022-03-08 (×2): qty 90, 30d supply, fill #1

## 2022-01-24 ENCOUNTER — Other Ambulatory Visit: Payer: Self-pay

## 2022-01-24 ENCOUNTER — Other Ambulatory Visit (HOSPITAL_COMMUNITY): Payer: Self-pay

## 2022-01-24 ENCOUNTER — Emergency Department (HOSPITAL_COMMUNITY): Payer: Medicaid Other

## 2022-01-24 ENCOUNTER — Emergency Department (HOSPITAL_COMMUNITY)
Admission: EM | Admit: 2022-01-24 | Discharge: 2022-01-24 | Discharge status: Against Medical Advice | Payer: Medicaid Other | Attending: Emergency Medicine | Admitting: Emergency Medicine

## 2022-01-24 DIAGNOSIS — R079 Chest pain, unspecified: Secondary | ICD-10-CM | POA: Diagnosis present

## 2022-01-24 DIAGNOSIS — Z5321 Procedure and treatment not carried out due to patient leaving prior to being seen by health care provider: Secondary | ICD-10-CM | POA: Diagnosis not present

## 2022-01-24 DIAGNOSIS — R519 Headache, unspecified: Secondary | ICD-10-CM | POA: Insufficient documentation

## 2022-01-24 DIAGNOSIS — R0602 Shortness of breath: Secondary | ICD-10-CM | POA: Insufficient documentation

## 2022-01-24 LAB — CBC
HCT: 44.8 % (ref 39.0–52.0)
Hemoglobin: 15.8 g/dL (ref 13.0–17.0)
MCH: 29.3 pg (ref 26.0–34.0)
MCHC: 35.3 g/dL (ref 30.0–36.0)
MCV: 83.1 fL (ref 80.0–100.0)
Platelets: 466 10*3/uL — ABNORMAL HIGH (ref 150–400)
RBC: 5.39 MIL/uL (ref 4.22–5.81)
RDW: 13.1 % (ref 11.5–15.5)
WBC: 10.6 10*3/uL — ABNORMAL HIGH (ref 4.0–10.5)
nRBC: 0 % (ref 0.0–0.2)

## 2022-01-24 LAB — BASIC METABOLIC PANEL
Anion gap: 10 (ref 5–15)
BUN: 11 mg/dL (ref 6–20)
CO2: 20 mmol/L — ABNORMAL LOW (ref 22–32)
Calcium: 9.7 mg/dL (ref 8.9–10.3)
Chloride: 106 mmol/L (ref 98–111)
Creatinine, Ser: 1.11 mg/dL (ref 0.61–1.24)
GFR, Estimated: >60 mL/min (ref >60)
Glucose, Bld: 95 mg/dL (ref 70–99)
Potassium: 4 mmol/L (ref 3.5–5.1)
Sodium: 136 mmol/L (ref 135–145)

## 2022-01-24 LAB — TROPONIN I (HIGH SENSITIVITY)
Troponin I (High Sensitivity): 5 ng/L (ref <18)
Troponin I (High Sensitivity): 4 ng/L (ref <18)

## 2022-01-24 NOTE — ED Provider Triage Note (Signed)
Emergency Medicine Provider Triage Evaluation Note  [name] , a 56 y.o. male  was evaluated in triage.  Pt complains of left sided chest pain that has been waxing and waning onset 2 days.  Had episode of chest pain last week.  Has intermittent shortness of breath.  As well as a headache.  History of cardiac catheterization 10 years ago.  No stents placed at that time.  Denies history of MI.  No recent injury, trauma, fall.  No recent heavy lifting.  Review of Systems  Positive: As per HPI Negative:   Physical Exam  BP (!) 136/105 (BP Location: Right Arm)   Pulse (!) 102   Temp 98.4 F (36.9 C) (Oral)   Resp 20   SpO2 98%  Gen:   Awake, no distress   Resp:  Normal effort  MSK:   Moves extremities without difficulty Other:   left-sided chest wall tenderness to palpation.  Medical Decision Making  Medically screening exam initiated at 6:41 PM.  Appropriate orders placed.  [name] was informed that the remainder of the evaluation will be completed by another provider, this initial triage assessment does not replace that evaluation, and the importance of remaining in the ED until their evaluation is complete.  Work-up initiated   Toryn Mcclinton A, PA-C 01/24/22 1844

## 2022-01-24 NOTE — ED Notes (Signed)
Pt stormed out of ED and stated that he would go to another hospital

## 2022-01-24 NOTE — ED Notes (Signed)
Pt diaphoretic and clammy in triage

## 2022-01-24 NOTE — ED Triage Notes (Signed)
Pt here for L side chest pain that radiates into L arm that has been constant since last night, pt reports intermittent pain x2 days and an episode of cp last week. Pt endorses sob, intermittent HA. Hx cardiac cath 10 years ago, no stents placed.

## 2022-01-31 ENCOUNTER — Other Ambulatory Visit (HOSPITAL_COMMUNITY): Payer: Self-pay

## 2022-02-01 ENCOUNTER — Other Ambulatory Visit (HOSPITAL_COMMUNITY): Payer: Self-pay

## 2022-02-01 MED ORDER — AMANTADINE HCL 100 MG PO CAPS
200.0000 mg | ORAL_CAPSULE | Freq: Every day | ORAL | 0 refills | Status: DC
  Filled 2022-02-01 – 2022-02-16 (×2): qty 180, 90d supply, fill #0

## 2022-02-01 MED ORDER — ERGOCALCIFEROL 1.25 MG (50000 UT) PO CAPS
50000.0000 [IU] | ORAL_CAPSULE | ORAL | 1 refills | Status: DC
  Filled 2022-02-01: qty 4, 28d supply, fill #0
  Filled 2022-02-26: qty 4, 28d supply, fill #1

## 2022-02-04 ENCOUNTER — Other Ambulatory Visit (HOSPITAL_COMMUNITY): Payer: Self-pay

## 2022-02-04 MED ORDER — REPATHA SURECLICK 140 MG/ML ~~LOC~~ SOAJ
SUBCUTANEOUS | 12 refills | Status: DC
  Filled 2022-02-04: qty 2, 28d supply, fill #0
  Filled 2022-02-26: qty 2, 28d supply, fill #1
  Filled 2022-03-27: qty 2, 28d supply, fill #2

## 2022-02-04 MED ORDER — OXYCODONE HCL 20 MG PO TABS
1.0000 | ORAL_TABLET | Freq: Four times a day (QID) | ORAL | 0 refills | Status: DC
  Filled 2022-02-04: qty 120, 30d supply, fill #0

## 2022-02-09 ENCOUNTER — Other Ambulatory Visit (HOSPITAL_COMMUNITY): Payer: Self-pay

## 2022-02-10 ENCOUNTER — Other Ambulatory Visit (HOSPITAL_COMMUNITY): Payer: Self-pay

## 2022-02-10 MED ORDER — FAMOTIDINE 20 MG PO TABS
ORAL_TABLET | ORAL | 1 refills | Status: DC
  Filled 2022-02-10: qty 90, 90d supply, fill #0
  Filled 2022-04-02: qty 90, 45d supply, fill #1

## 2022-02-15 ENCOUNTER — Other Ambulatory Visit (HOSPITAL_COMMUNITY): Payer: Self-pay

## 2022-02-16 ENCOUNTER — Other Ambulatory Visit (HOSPITAL_COMMUNITY): Payer: Self-pay

## 2022-02-18 ENCOUNTER — Other Ambulatory Visit (HOSPITAL_COMMUNITY): Payer: Self-pay

## 2022-02-19 ENCOUNTER — Other Ambulatory Visit (HOSPITAL_COMMUNITY): Payer: Self-pay

## 2022-02-20 ENCOUNTER — Other Ambulatory Visit (HOSPITAL_COMMUNITY): Payer: Self-pay

## 2022-02-20 MED ORDER — BUSPIRONE HCL 10 MG PO TABS
10.0000 mg | ORAL_TABLET | Freq: Three times a day (TID) | ORAL | 0 refills | Status: DC
  Filled 2022-02-20 – 2022-03-08 (×2): qty 270, 90d supply, fill #0

## 2022-02-20 MED ORDER — DULOXETINE HCL 40 MG PO CPEP
40.0000 mg | ORAL_CAPSULE | Freq: Every day | ORAL | 0 refills | Status: DC
Start: 2022-02-20 — End: 2023-09-15
  Filled 2022-02-20 – 2022-02-26 (×3): qty 90, 90d supply, fill #0

## 2022-02-20 MED ORDER — QUETIAPINE FUMARATE ER 400 MG PO TB24
400.0000 mg | ORAL_TABLET | Freq: Every day | ORAL | 0 refills | Status: DC
  Filled 2022-02-20 – 2022-02-22 (×2): qty 90, 90d supply, fill #0

## 2022-02-20 MED ORDER — TRAZODONE HCL 50 MG PO TABS
50.0000 mg | ORAL_TABLET | Freq: Every day | ORAL | 0 refills | Status: DC
  Filled 2022-02-20 – 2022-02-24 (×2): qty 90, 90d supply, fill #0

## 2022-02-20 MED ORDER — HYDROXYZINE HCL 25 MG PO TABS
25.0000 mg | ORAL_TABLET | Freq: Three times a day (TID) | ORAL | 0 refills | Status: DC
  Filled 2022-02-20 – 2022-02-24 (×2): qty 270, 90d supply, fill #0

## 2022-02-20 MED ORDER — QUETIAPINE FUMARATE ER 150 MG PO TB24
150.0000 mg | ORAL_TABLET | Freq: Every evening | ORAL | 0 refills | Status: DC
  Filled 2022-02-20 – 2022-02-22 (×2): qty 90, 90d supply, fill #0

## 2022-02-22 ENCOUNTER — Other Ambulatory Visit (HOSPITAL_COMMUNITY): Payer: Self-pay

## 2022-02-24 ENCOUNTER — Other Ambulatory Visit (HOSPITAL_COMMUNITY): Payer: Self-pay

## 2022-02-24 MED ORDER — ESOMEPRAZOLE MAGNESIUM 40 MG PO CPDR
40.0000 mg | DELAYED_RELEASE_CAPSULE | Freq: Every day | ORAL | 1 refills | Status: DC
  Filled 2022-02-24: qty 90, 90d supply, fill #0

## 2022-02-24 MED ORDER — LEVOTHYROXINE SODIUM 50 MCG PO TABS
50.0000 ug | ORAL_TABLET | Freq: Every day | ORAL | 1 refills | Status: DC
  Filled 2022-02-24: qty 90, 90d supply, fill #0

## 2022-02-25 ENCOUNTER — Other Ambulatory Visit (HOSPITAL_COMMUNITY): Payer: Self-pay

## 2022-02-26 ENCOUNTER — Other Ambulatory Visit (HOSPITAL_COMMUNITY): Payer: Self-pay

## 2022-02-27 ENCOUNTER — Other Ambulatory Visit (HOSPITAL_COMMUNITY): Payer: Self-pay

## 2022-02-28 ENCOUNTER — Other Ambulatory Visit (HOSPITAL_COMMUNITY): Payer: Self-pay

## 2022-03-01 ENCOUNTER — Other Ambulatory Visit (HOSPITAL_COMMUNITY): Payer: Self-pay

## 2022-03-03 ENCOUNTER — Other Ambulatory Visit (HOSPITAL_COMMUNITY): Payer: Self-pay

## 2022-03-03 MED ORDER — DICLOFENAC SODIUM 1 % EX GEL
CUTANEOUS | 0 refills | Status: DC
  Filled 2022-03-03: qty 300, 10d supply, fill #0

## 2022-03-03 MED ORDER — METHOCARBAMOL 750 MG PO TABS
750.0000 mg | ORAL_TABLET | Freq: Four times a day (QID) | ORAL | 6 refills | Status: DC
  Filled 2022-03-03: qty 120, 30d supply, fill #0
  Filled 2022-03-25: qty 120, 30d supply, fill #1

## 2022-03-03 MED ORDER — OXYCODONE HCL 20 MG PO TABS
1.0000 | ORAL_TABLET | Freq: Four times a day (QID) | ORAL | 0 refills | Status: DC
  Filled 2022-03-04: qty 120, 30d supply, fill #0

## 2022-03-04 ENCOUNTER — Other Ambulatory Visit (HOSPITAL_COMMUNITY): Payer: Self-pay

## 2022-03-10 ENCOUNTER — Other Ambulatory Visit (HOSPITAL_COMMUNITY): Payer: Self-pay

## 2022-03-11 ENCOUNTER — Other Ambulatory Visit (HOSPITAL_COMMUNITY): Payer: Self-pay

## 2022-03-21 ENCOUNTER — Other Ambulatory Visit (HOSPITAL_COMMUNITY): Payer: Self-pay

## 2022-03-27 ENCOUNTER — Other Ambulatory Visit (HOSPITAL_COMMUNITY): Payer: Self-pay

## 2022-04-01 ENCOUNTER — Other Ambulatory Visit (HOSPITAL_COMMUNITY): Payer: Self-pay

## 2022-04-02 ENCOUNTER — Other Ambulatory Visit (HOSPITAL_COMMUNITY): Payer: Self-pay

## 2022-04-02 MED ORDER — ERGOCALCIFEROL 1.25 MG (50000 UT) PO CAPS
50000.0000 [IU] | ORAL_CAPSULE | ORAL | 1 refills | Status: DC
  Filled 2022-04-02: qty 4, 28d supply, fill #0

## 2022-04-03 ENCOUNTER — Other Ambulatory Visit (HOSPITAL_COMMUNITY): Payer: Self-pay

## 2022-04-03 MED ORDER — DULOXETINE HCL 40 MG PO CPEP
1.0000 | ORAL_CAPSULE | Freq: Every day | ORAL | 0 refills | Status: DC
Start: 2022-04-03 — End: 2023-09-15

## 2022-04-03 MED ORDER — QUETIAPINE FUMARATE ER 400 MG PO TB24: 400.0000 mg | ORAL_TABLET | Freq: Every evening | ORAL | 0 refills | Status: DC

## 2022-04-03 MED ORDER — HYDROXYZINE HCL 25 MG PO TABS: 25.0000 mg | ORAL_TABLET | Freq: Three times a day (TID) | ORAL | 0 refills | Status: AC

## 2022-04-03 MED ORDER — LOXAPINE SUCCINATE 25 MG PO CAPS
25.0000 mg | ORAL_CAPSULE | Freq: Two times a day (BID) | ORAL | 0 refills | Status: DC
  Filled 2022-04-03: qty 180, 90d supply, fill #0

## 2022-04-03 MED ORDER — QUETIAPINE FUMARATE ER 150 MG PO TB24
150.0000 mg | ORAL_TABLET | Freq: Every evening | ORAL | 0 refills | Status: DC
  Filled 2022-04-03: qty 90, 90d supply, fill #0

## 2022-04-03 MED ORDER — BUSPIRONE HCL 10 MG PO TABS: 10.0000 mg | ORAL_TABLET | Freq: Three times a day (TID) | ORAL | 0 refills | Status: DC

## 2022-04-03 MED ORDER — TRAZODONE HCL 50 MG PO TABS
50.0000 mg | ORAL_TABLET | Freq: Every day | ORAL | 0 refills | Status: DC
  Filled 2022-04-03: qty 90, 90d supply, fill #0

## 2022-04-04 ENCOUNTER — Other Ambulatory Visit (HOSPITAL_COMMUNITY): Payer: Self-pay

## 2022-04-04 MED ORDER — DICLOFENAC SODIUM 1 % EX GEL
2.0000 g | Freq: Four times a day (QID) | CUTANEOUS | 0 refills | Status: DC | PRN
  Filled 2022-04-04: qty 300, 18d supply, fill #0

## 2022-04-04 MED ORDER — REPATHA SURECLICK 140 MG/ML ~~LOC~~ SOAJ
140.0000 mg | SUBCUTANEOUS | 12 refills | Status: DC
  Filled 2022-04-04: qty 2, 28d supply, fill #0

## 2022-04-04 MED ORDER — OXYCODONE HCL 20 MG PO TABS
1.0000 | ORAL_TABLET | Freq: Four times a day (QID) | ORAL | 0 refills | Status: DC
  Filled 2022-04-04: qty 120, 30d supply, fill #0

## 2022-04-04 MED ORDER — ONDANSETRON HCL 4 MG PO TABS
4.0000 mg | ORAL_TABLET | Freq: Four times a day (QID) | ORAL | 0 refills | Status: DC | PRN
  Filled 2022-04-04 – 2022-04-07 (×2): qty 120, 30d supply, fill #0

## 2022-04-07 ENCOUNTER — Other Ambulatory Visit (HOSPITAL_COMMUNITY): Payer: Self-pay

## 2022-04-08 ENCOUNTER — Other Ambulatory Visit (HOSPITAL_COMMUNITY): Payer: Self-pay

## 2022-04-10 ENCOUNTER — Other Ambulatory Visit (HOSPITAL_COMMUNITY): Payer: Self-pay

## 2022-04-21 ENCOUNTER — Other Ambulatory Visit (HOSPITAL_COMMUNITY): Payer: Self-pay

## 2022-04-25 ENCOUNTER — Emergency Department (HOSPITAL_COMMUNITY): Payer: Medicaid Other

## 2022-04-25 ENCOUNTER — Other Ambulatory Visit (HOSPITAL_COMMUNITY): Payer: Self-pay

## 2022-04-25 ENCOUNTER — Emergency Department (HOSPITAL_COMMUNITY)
Admission: EM | Admit: 2022-04-25 | Discharge: 2022-04-25 | Disposition: A | Discharge status: Home / Self Care | Payer: Medicaid Other | Attending: Emergency Medicine | Admitting: Emergency Medicine

## 2022-04-25 ENCOUNTER — Other Ambulatory Visit: Payer: Self-pay

## 2022-04-25 DIAGNOSIS — F17200 Nicotine dependence, unspecified, uncomplicated: Secondary | ICD-10-CM | POA: Diagnosis not present

## 2022-04-25 DIAGNOSIS — W01198A Fall on same level from slipping, tripping and stumbling with subsequent striking against other object, initial encounter: Secondary | ICD-10-CM | POA: Insufficient documentation

## 2022-04-25 DIAGNOSIS — S0990XA Unspecified injury of head, initial encounter: Secondary | ICD-10-CM | POA: Insufficient documentation

## 2022-04-25 DIAGNOSIS — S199XXA Unspecified injury of neck, initial encounter: Secondary | ICD-10-CM | POA: Insufficient documentation

## 2022-04-25 DIAGNOSIS — W19XXXA Unspecified fall, initial encounter: Secondary | ICD-10-CM

## 2022-04-25 MED ORDER — LIDOCAINE 5 % EX PTCH
1.0000 | MEDICATED_PATCH | CUTANEOUS | 0 refills | Status: DC
  Filled 2022-04-25: qty 30, 30d supply, fill #0

## 2022-04-25 MED ORDER — NAPROXEN 500 MG PO TABS
500.0000 mg | ORAL_TABLET | Freq: Once | ORAL | Status: DC
  Filled 2022-04-25: qty 1

## 2022-04-25 MED ORDER — LIDOCAINE 5 % EX PTCH
2.0000 | MEDICATED_PATCH | CUTANEOUS | Status: DC
  Administered 2022-04-25: 2 via TRANSDERMAL
  Filled 2022-04-25: qty 2
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
## 2022-04-25 NOTE — ED Provider Notes (Signed)
[facility] DEPT Provider Note   CSN: [id number] Arrival date & time: 04/25/22  0134     History  Chief Complaint  Patient presents with   Neck Injury   Fall    [name] is a 56 y.o. male.  The history is provided by the patient.  Fall This is a new problem. The current episode started 3 to 5 hours ago. The problem occurs constantly. The problem has been resolved. Pertinent negatives include no chest pain, no abdominal pain, no headaches and no shortness of breath. Nothing aggravates the symptoms. Nothing relieves the symptoms. He has tried nothing for the symptoms. The treatment provided no relief.  Patient reports was outside smoking and slipped on grass and fell back striking his neck.       Home Medications Prior to Admission medications   Medication Sig Start Date End Date Taking? Authorizing Provider  lidocaine (LIDODERM) 5 % Place 1 patch onto the skin daily. Remove & Discard patch within 12 hours or as directed by MD 04/25/22  Yes Averleigh Savary, MD  amantadine (SYMMETREL) 100 MG capsule Take 1 capsule (100 mg total) by mouth 2 (two) times daily. 07/16/21   Massengill, Ovid Curd, MD  amantadine (SYMMETREL) 100 MG capsule Take 2 capsules (200 mg total) by mouth daily. 02/01/22     amantadine (SYMMETREL) 100 MG capsule Take1 capsule by mouth two times daily 10/07/21     busPIRone (BUSPAR) 10 MG tablet Take 1 tablet by mouth 3 times daily. 08/01/21     busPIRone (BUSPAR) 10 MG tablet Take 1 tablet by mouth 3 times a day 08/07/21     busPIRone (BUSPAR) 10 MG tablet Take 1 tablet by mouth 3 times daily. 09/26/21     busPIRone (BUSPAR) 10 MG tablet Take 1 tablet by mouth 3 times a day 12/05/21     busPIRone (BUSPAR) 10 MG tablet Take 1 tablet (10 mg total) by mouth 3 (three) times daily. 02/20/22     busPIRone (BUSPAR) 10 MG tablet Take 1 tablet (10 mg total) by mouth 3 (three) times daily. 04/03/22     diclofenac Sodium (VOLTAREN) 1 % GEL Apply 2 - 4 grams  to painful joints up to 4 times daily if needed (max daily dose: 32 Gram) 03/03/22     diclofenac Sodium (VOLTAREN) 1 % GEL Apply 2-4 g topically 4 (four) times daily as needed. 04/04/22     dicyclomine (BENTYL) 10 MG capsule Take 1 (one) capsule by mouth every eight hours as needed 01/23/22     DULoxetine (CYMBALTA) 60 MG capsule Take 1 capsule (60 mg total) by mouth daily. 09/26/21     DULoxetine HCl 40 MG CPEP Take 1 capsule by mouth once every day 12/05/21     DULoxetine HCl 40 MG CPEP Take one capsule by mouth daily. 02/20/22     DULoxetine HCl 40 MG CPEP Take 1 capsule by mouth daily. 04/03/22     ergocalciferol (VITAMIN D2) 1.25 MG (50000 UT) capsule Take 1 capsule (50,000 Units total) by mouth once a week. 04/02/22     esomeprazole (NEXIUM) 40 MG capsule Take 1 capsule by mouth daily 08/07/21     esomeprazole (NEXIUM) 40 MG capsule Take 1 capsule (40 mg total) by mouth daily. 02/24/22     Evolocumab (REPATHA SURECLICK) 450 MG/ML SOAJ Inject 1 auto-injector into the skin twice a month 08/07/21     Evolocumab (REPATHA SURECLICK) 388 MG/ML SOAJ Inject 1 pen 2 times a month  09/05/21     Evolocumab (REPATHA SURECLICK) 751 MG/ML SOAJ Inject 1 syringe twice a month 10/04/21     Evolocumab (REPATHA SURECLICK) 025 MG/ML SOAJ Inject 1 pen under the skin twice a month 11/05/21     Evolocumab (REPATHA SURECLICK) 852 MG/ML SOAJ Inject 10m twice a month. 12/05/21     Evolocumab (REPATHA SURECLICK) 1778MG/ML SOAJ Inject 1 pen under the skin every 2 weeks as directed 01/06/22     Evolocumab (REPATHA SURECLICK) 1242MG/ML SOAJ Inject 1 mL every 2 weeks. 02/04/22     Evolocumab (REPATHA SURECLICK) 1353MG/ML SOAJ Inject 140 mg into the skin every 14 (fourteen) days. 04/04/22     famotidine (PEPCID) 20 MG tablet Take 1 (one) tablet by mouth two times daily 02/10/22     hydrOXYzine (ATARAX) 25 MG tablet Take 1 tablet (25 mg total) by mouth 3 (three) times daily as needed for anxiety. 07/16/21   Massengill, NOvid Curd MD  hydrOXYzine (ATARAX)  25 MG tablet Take 1 tablet by mouth 3 times every day as needed for anxiety. 08/01/21     hydrOXYzine (ATARAX) 25 MG tablet Take 1 tablet by mouth 3 times a day as needed 08/07/21     hydrOXYzine (ATARAX) 25 MG tablet Take 1 tablet (25 mg total) by mouth 3 (three) times daily as needed for anxiety 09/26/21     hydrOXYzine (ATARAX) 25 MG tablet Take 1 tablet by mouth 3 times every day as needed for Anxiety 12/05/21     hydrOXYzine (ATARAX) 25 MG tablet Take 1 tablet (25 mg total) by mouth 3 (three) times daily. 02/20/22     hydrOXYzine (ATARAX) 25 MG tablet Take 1 tablet (25 mg total) by mouth 3 (three) times daily. 04/03/22     levothyroxine (SYNTHROID) 50 MCG tablet Take 1 tablet (50 mcg total) by mouth daily. 02/24/22     methocarbamol (ROBAXIN) 500 MG tablet Take 1 tablet by mouth every six hours as needed for pain and spasm Patient not taking: Reported on 07/13/2021 03/07/21     methocarbamol (ROBAXIN) 750 MG tablet Take 1 tablet (750 mg total) by mouth 4 (four) times daily as needed. 03/02/22     naloxone (NARCAN) nasal spray 4 mg/0.1 mL Use 1 spray in nose as directed Patient not taking: Reported on 07/13/2021 04/17/21     nicotine polacrilex (COMMIT) 4 MG lozenge Take 4 mg by mouth as needed for smoking cessation.    [provider]  nicotine polacrilex (SM NICOTINE POLACRILEX) 4 MG lozenge Take 1 lozenge (4 mg total) by mouth every 2 (two) hours as needed for smoking cessation. 07/16/21   Massengill, NOvid Curd MD  ondansetron (ZOFRAN) 4 MG tablet Take 1  tablet by mouth four times daily as needed 08/07/21     ondansetron (ZOFRAN) 4 MG tablet Take 1 tablet (4 mg total) by mouth 4 (four) times daily as needed. 04/04/22     Oxycodone HCl 20 MG TABS Take 1 Tablet by mouth four times daily 04/04/22     pantoprazole (PROTONIX) 40 MG tablet Take 1 tablet (40 mg total) by mouth daily. 07/17/21   NNicholes Rough NP  propranolol ER (INDERAL LA) 60 MG 24 hr capsule Take 1 capsule (60 mg total) by mouth daily.  07/17/21   Massengill, NOvid Curd MD  propranolol ER (INDERAL LA) 60 MG 24 hr capsule Take 1 capsule by mouth daily 08/07/21     QUEtiapine (SEROQUEL XR) 200 MG 24 hr tablet Take 1 tablet by mouth in  the evening on an empty stomach or with a light meal. 08/01/21     QUEtiapine (SEROQUEL XR) 200 MG 24 hr tablet Take 1 tablet by mouth every day in the evening without food or with a light meal 09/26/21     QUEtiapine (SEROQUEL XR) 300 MG 24 hr tablet Take 1 tablet by mouth every day in the evening without food or with a light meal 10/08/21     QUEtiapine (SEROQUEL XR) 400 MG 24 hr tablet Take 1 tablet by mouth once in the evening without food or with a light meal 12/05/21     QUEtiapine (SEROQUEL XR) 400 MG 24 hr tablet Take 1 tablet (400 mg total) by mouth daily in the evening without food or with a light meal. 02/20/22     QUEtiapine (SEROQUEL XR) 400 MG 24 hr tablet Take 1 tablet (400 mg total) by mouth every evening without food or with a light meal. 04/03/22     QUEtiapine Fumarate (SEROQUEL XR) 150 MG 24 hr tablet Take 1 tablet by mouth at bedtime 08/07/21     QUEtiapine Fumarate (SEROQUEL XR) 150 MG 24 hr tablet Take 1 tablet (150 mg total) by mouth every evening without food or with a light meal. 02/20/22     QUEtiapine Fumarate (SEROQUEL XR) 150 MG 24 hr tablet Take 1 tablet by mouth every evening without food or with a light meal. 04/03/22     traZODone (DESYREL) 50 MG tablet Take 1 tablet by mouth every day at bedtime. 08/01/21     traZODone (DESYREL) 50 MG tablet Take 1  tablet by mouth at bedtime 08/07/21     traZODone (DESYREL) 50 MG tablet Take 1 tablet by mouth at bedtime. 09/26/21     traZODone (DESYREL) 50 MG tablet Take 1 tablet by mouth once at bedtime 12/05/21     traZODone (DESYREL) 50 MG tablet Take 1 tablet (50 mg total) by mouth at bedtime. 02/20/22     traZODone (DESYREL) 50 MG tablet Take 1 tablet (50 mg total) by mouth at bedtime. 04/03/22     Doxepin HCl 3 MG TABS Take 1 tablet by mouth every evening  30 minutes before bedtime on an empty stomach 05/23/21 06/06/21    loxapine (LOXITANE) 25 MG capsule Take 1 capsule (25 mg total) by mouth 2 (two) times daily. 04/03/22 04/10/22        Allergies    Zolpidem tartrate, Lyrica [pregabalin], and Demerol [meperidine]    Review of Systems   Review of Systems  Constitutional:  Negative for fever.  Respiratory:  Negative for shortness of breath.   Cardiovascular:  Negative for chest pain.  Gastrointestinal:  Negative for abdominal pain.  Musculoskeletal:  Negative for gait problem and neck stiffness.  Neurological:  Negative for weakness, numbness and headaches.  All other systems reviewed and are negative.   Physical Exam Updated Vital Signs BP (!) 158/122 (BP Location: Left Arm)   Pulse (!) 107   Temp 98.3 F (36.8 C) (Oral)   Resp 16   Ht 6' (1.829 m)   Wt 104.3 kg   SpO2 98%   BMI 31.19 kg/m  Physical Exam Vitals and nursing note reviewed.  Constitutional:      General: He is not in acute distress.    Appearance: He is well-developed. He is not diaphoretic.  HENT:     Head: Normocephalic and atraumatic.     Nose: Nose normal.  Eyes:     Conjunctiva/sclera: Conjunctivae normal.  Pupils: Pupils are equal, round, and reactive to light.  Cardiovascular:     Rate and Rhythm: Normal rate and regular rhythm.     Pulses: Normal pulses.     Heart sounds: Normal heart sounds.  Pulmonary:     Effort: Pulmonary effort is normal.     Breath sounds: Normal breath sounds. No wheezing or rales.  Abdominal:     General: Bowel sounds are normal.     Palpations: Abdomen is soft.     Tenderness: There is no abdominal tenderness. There is no guarding or rebound.  Musculoskeletal:        General: Normal range of motion.     Cervical back: Normal range of motion and neck supple.  Skin:    General: Skin is warm and dry.     Capillary Refill: Capillary refill takes less than 2 seconds.  Neurological:     General: No focal deficit  present.     Mental Status: He is alert and oriented to person, place, and time.     Deep Tendon Reflexes: Reflexes normal.  Psychiatric:        Mood and Affect: Mood normal.        Behavior: Behavior normal.     ED Results / Procedures / Treatments   Labs (all labs ordered are listed, but only abnormal results are displayed) Labs Reviewed - No data to display  EKG None  Radiology CT Head Wo Contrast  Result Date: 04/25/2022 CLINICAL DATA:  Fall EXAM: CT HEAD WITHOUT CONTRAST CT CERVICAL SPINE WITHOUT CONTRAST TECHNIQUE: Multidetector CT imaging of the head and cervical spine was performed following the standard protocol without intravenous contrast. Multiplanar CT image reconstructions of the cervical spine were also generated. RADIATION DOSE REDUCTION: This exam was performed according to the departmental dose-optimization program which includes automated exposure control, adjustment of the mA and/or kV according to patient size and/or use of iterative reconstruction technique. COMPARISON:  06/28/2020 CT head; 03/01/2021 CT cervical spine FINDINGS: CT HEAD FINDINGS Brain: No evidence of acute infarct, hemorrhage, mass, mass effect, or midline shift. No hydrocephalus or extra-axial fluid collection. Vascular: No hyperdense vessel. Skull: Normal. Negative for fracture or focal lesion. Sinuses/Orbits: No acute finding. Other: The mastoid air cells are well aerated. CT CERVICAL SPINE FINDINGS Alignment: Unchanged trace anterolisthesis of C2 on C3. Skull base and vertebrae: ACDF C3-C6, with solid osseous fusion across the disc spaces and facets. No acute fracture. No primary bone lesion or focal pathologic process. Soft tissues and spinal canal: No prevertebral fluid or swelling. No visible canal hematoma. Disc levels: No significant spinal canal stenosis. Redemonstrated moderate to severe bilateral neural foraminal narrowing at C6-C7, unchanged. Upper chest: Negative. Other: None. IMPRESSION: 1.  No acute intracranial process. 2. No acute fracture or traumatic listhesis in the cervical spine. Electronically Signed   By: Merilyn Baba M.D.   On: 04/25/2022 02:44   CT Cervical Spine Wo Contrast  Result Date: 04/25/2022 CLINICAL DATA:  Fall EXAM: CT HEAD WITHOUT CONTRAST CT CERVICAL SPINE WITHOUT CONTRAST TECHNIQUE: Multidetector CT imaging of the head and cervical spine was performed following the standard protocol without intravenous contrast. Multiplanar CT image reconstructions of the cervical spine were also generated. RADIATION DOSE REDUCTION: This exam was performed according to the departmental dose-optimization program which includes automated exposure control, adjustment of the mA and/or kV according to patient size and/or use of iterative reconstruction technique. COMPARISON:  06/28/2020 CT head; 03/01/2021 CT cervical spine FINDINGS: CT HEAD FINDINGS Brain: No  evidence of acute infarct, hemorrhage, mass, mass effect, or midline shift. No hydrocephalus or extra-axial fluid collection. Vascular: No hyperdense vessel. Skull: Normal. Negative for fracture or focal lesion. Sinuses/Orbits: No acute finding. Other: The mastoid air cells are well aerated. CT CERVICAL SPINE FINDINGS Alignment: Unchanged trace anterolisthesis of C2 on C3. Skull base and vertebrae: ACDF C3-C6, with solid osseous fusion across the disc spaces and facets. No acute fracture. No primary bone lesion or focal pathologic process. Soft tissues and spinal canal: No prevertebral fluid or swelling. No visible canal hematoma. Disc levels: No significant spinal canal stenosis. Redemonstrated moderate to severe bilateral neural foraminal narrowing at C6-C7, unchanged. Upper chest: Negative. Other: None. IMPRESSION: 1. No acute intracranial process. 2. No acute fracture or traumatic listhesis in the cervical spine. Electronically Signed   By: Merilyn Baba M.D.   On: 04/25/2022 02:44   DG Lumbar Spine Complete  Result Date:  04/25/2022 CLINICAL DATA:  Fall EXAM: LUMBAR SPINE - COMPLETE 5 VIEW COMPARISON:  03/12/2021 FINDINGS: There is no evidence of lumbar spine fracture. Alignment is normal. Intervertebral disc spaces are maintained. Again noted is a spinal cord stimulator, with wires partially visualized. IMPRESSION: No acute osseous abnormality. Electronically Signed   By: Merilyn Baba M.D.   On: 04/25/2022 02:37    Procedures Procedures    Medications Ordered in ED Medications  lidocaine (LIDODERM) 5 % 2 patch (has no administration in time range)  naproxen (NAPROSYN) tablet 500 mg (has no administration in time range)    ED Course/ Medical Decision Making/ A&P                           Medical Decision Making Slipped and fall in grass   Problems Addressed: Fall, initial encounter:    Details: No injuries on imaging, walking without difficulty   Amount and/or Complexity of Data Reviewed Radiology: ordered.  Risk Prescription drug management. Risk Details: Exam and vitals are benign and reassuring.  Imaging is normal.  Stable for discharge.     Final Clinical Impression(s) / ED Diagnoses Final diagnoses:  Fall, initial encounter   Return for intractable cough, coughing up blood, fevers > 100.4 unrelieved by medication, shortness of breath, intractable vomiting, chest pain, shortness of breath, weakness, numbness, changes in speech, facial asymmetry, abdominal pain, passing out, Inability to tolerate liquids or food, cough, altered mental status or any concerns. No signs of systemic illness or infection. The patient is nontoxic-appearing on exam and vital signs are within normal limits.  I have reviewed the triage vital signs and the nursing notes. Pertinent labs & imaging results that were available during my care of the patient were reviewed by me and considered in my medical decision making (see chart for details). After history, exam, and medical workup I feel the patient has been  appropriately medically screened and is safe for discharge home. Pertinent diagnoses were discussed with the patient. Patient was given return precautions.    Rx / DC Orders ED Discharge Orders          Ordered    lidocaine (LIDODERM) 5 %  Every 24 hours        04/25/22 0406              Shem Plemmons, MD 04/25/22 4431

## 2022-04-28 ENCOUNTER — Other Ambulatory Visit (HOSPITAL_COMMUNITY): Payer: Self-pay

## 2022-04-28 MED ORDER — OXYCODONE HCL 20 MG PO TABS
1.0000 | ORAL_TABLET | Freq: Four times a day (QID) | ORAL | 0 refills | Status: DC
  Filled 2022-04-28 – 2022-05-02 (×3): qty 120, 30d supply, fill #0

## 2022-04-29 ENCOUNTER — Other Ambulatory Visit (HOSPITAL_COMMUNITY): Payer: Self-pay

## 2022-04-29 MED ORDER — DICLOFENAC SODIUM 1 % EX GEL
2.0000 g | Freq: Four times a day (QID) | CUTANEOUS | 0 refills | Status: DC | PRN
  Filled 2022-04-29: qty 300, 10d supply, fill #0

## 2022-05-01 ENCOUNTER — Other Ambulatory Visit (HOSPITAL_COMMUNITY): Payer: Self-pay

## 2022-05-02 ENCOUNTER — Other Ambulatory Visit (HOSPITAL_COMMUNITY): Payer: Self-pay

## 2022-05-03 ENCOUNTER — Other Ambulatory Visit: Payer: Self-pay

## 2022-05-03 ENCOUNTER — Emergency Department (HOSPITAL_COMMUNITY)
Admission: EM | Admit: 2022-05-03 | Discharge: 2022-05-03 | Discharge status: Against Medical Advice | Payer: Medicaid Other | Attending: Emergency Medicine | Admitting: Emergency Medicine

## 2022-05-03 ENCOUNTER — Ambulatory Visit (HOSPITAL_COMMUNITY)
Admission: AD | Admit: 2022-05-03 | Discharge: 2022-05-03 | Disposition: A | Discharge status: Home / Self Care | Payer: Medicaid Other | Attending: Psychiatry | Admitting: Psychiatry

## 2022-05-03 DIAGNOSIS — F419 Anxiety disorder, unspecified: Secondary | ICD-10-CM | POA: Insufficient documentation

## 2022-05-03 DIAGNOSIS — Z1152 Encounter for screening for COVID-19: Secondary | ICD-10-CM | POA: Diagnosis not present

## 2022-05-03 DIAGNOSIS — R441 Visual hallucinations: Secondary | ICD-10-CM | POA: Insufficient documentation

## 2022-05-03 DIAGNOSIS — F3110 Bipolar disorder, current episode manic without psychotic features, unspecified: Secondary | ICD-10-CM | POA: Diagnosis not present

## 2022-05-03 DIAGNOSIS — R440 Auditory hallucinations: Secondary | ICD-10-CM | POA: Insufficient documentation

## 2022-05-03 DIAGNOSIS — F200 Paranoid schizophrenia: Secondary | ICD-10-CM

## 2022-05-03 LAB — RAPID URINE DRUG SCREEN, HOSP PERFORMED
Amphetamines: NOT DETECTED
Barbiturates: NOT DETECTED
Benzodiazepines: NOT DETECTED
Cocaine: NOT DETECTED
Opiates: POSITIVE — AB
Tetrahydrocannabinol: NOT DETECTED

## 2022-05-03 LAB — RESP PANEL BY RT-PCR (FLU A&B, COVID) ARPGX2
Influenza A by PCR: NEGATIVE
Influenza B by PCR: NEGATIVE
SARS Coronavirus 2 by RT PCR: NEGATIVE

## 2022-05-03 LAB — CBC WITH DIFFERENTIAL/PLATELET
Abs Immature Granulocytes: 0.06 10*3/uL (ref 0.00–0.07)
Basophils Absolute: 0.1 10*3/uL (ref 0.0–0.1)
Basophils Relative: 1 %
Eosinophils Absolute: 0.4 10*3/uL (ref 0.0–0.5)
Eosinophils Relative: 5 %
HCT: 40 % (ref 39.0–52.0)
Hemoglobin: 13.3 g/dL (ref 13.0–17.0)
Immature Granulocytes: 1 %
Lymphocytes Relative: 33 %
Lymphs Abs: 3.2 10*3/uL (ref 0.7–4.0)
MCH: 29.8 pg (ref 26.0–34.0)
MCHC: 33.3 g/dL (ref 30.0–36.0)
MCV: 89.5 fL (ref 80.0–100.0)
Monocytes Absolute: 1 10*3/uL (ref 0.1–1.0)
Monocytes Relative: 11 %
Neutro Abs: 4.6 10*3/uL (ref 1.7–7.7)
Neutrophils Relative %: 49 %
Platelets: 338 10*3/uL (ref 150–400)
RBC: 4.47 MIL/uL (ref 4.22–5.81)
RDW: 13.2 % (ref 11.5–15.5)
WBC: 9.5 10*3/uL (ref 4.0–10.5)
nRBC: 0 % (ref 0.0–0.2)

## 2022-05-03 LAB — BASIC METABOLIC PANEL
Anion gap: 8 (ref 5–15)
BUN: 21 mg/dL — ABNORMAL HIGH (ref 6–20)
CO2: 27 mmol/L (ref 22–32)
Calcium: 9.3 mg/dL (ref 8.9–10.3)
Chloride: 106 mmol/L (ref 98–111)
Creatinine, Ser: 1.22 mg/dL (ref 0.61–1.24)
GFR, Estimated: >60 mL/min (ref >60)
Glucose, Bld: 108 mg/dL — ABNORMAL HIGH (ref 70–99)
Potassium: 4.4 mmol/L (ref 3.5–5.1)
Sodium: 141 mmol/L (ref 135–145)

## 2022-05-03 LAB — ETHANOL: Alcohol, Ethyl (B): <10 mg/dL (ref <10)

## 2022-05-03 MED ORDER — HYDROXYZINE HCL 25 MG PO TABS
25.0000 mg | ORAL_TABLET | Freq: Three times a day (TID) | ORAL | Status: DC | PRN
  Administered 2022-05-03: 25 mg via ORAL
  Filled 2022-05-03: qty 1

## 2022-05-03 MED ORDER — LEVOTHYROXINE SODIUM 50 MCG PO TABS
50.0000 ug | ORAL_TABLET | Freq: Every day | ORAL | Status: DC
  Administered 2022-05-03: 50 ug via ORAL
  Filled 2022-05-03: qty 1

## 2022-05-03 MED ORDER — QUETIAPINE FUMARATE ER 50 MG PO TB24
150.0000 mg | ORAL_TABLET | Freq: Every day | ORAL | Status: DC
  Filled 2022-05-03: qty 3

## 2022-05-03 MED ORDER — PANTOPRAZOLE SODIUM 40 MG PO TBEC
40.0000 mg | DELAYED_RELEASE_TABLET | Freq: Every day | ORAL | Status: DC
  Administered 2022-05-03: 40 mg via ORAL
  Filled 2022-05-03: qty 1

## 2022-05-03 MED ORDER — LORAZEPAM 1 MG PO TABS
1.0000 mg | ORAL_TABLET | Freq: Once | ORAL | Status: AC
  Administered 2022-05-03: 1 mg via ORAL
  Filled 2022-05-03: qty 1

## 2022-05-03 MED ORDER — QUETIAPINE FUMARATE ER 200 MG PO TB24
400.0000 mg | ORAL_TABLET | Freq: Every day | ORAL | Status: DC
  Filled 2022-05-03: qty 2

## 2022-05-03 MED ORDER — METHOCARBAMOL 500 MG PO TABS: 750.0000 mg | ORAL_TABLET | Freq: Four times a day (QID) | ORAL | Status: DC

## 2022-05-03 MED ORDER — OXYCODONE-ACETAMINOPHEN 5-325 MG PO TABS
2.0000 | ORAL_TABLET | Freq: Once | ORAL | Status: AC
  Administered 2022-05-03: 2 via ORAL
  Filled 2022-05-03: qty 2

## 2022-05-03 MED ORDER — DULOXETINE HCL 30 MG PO CPEP
60.0000 mg | ORAL_CAPSULE | Freq: Every day | ORAL | Status: DC
  Administered 2022-05-03: 60 mg via ORAL
  Filled 2022-05-03: qty 2

## 2022-05-03 NOTE — ED Triage Notes (Signed)
C/o Auditory and visual hallucinations x2 days. Pt states " people talking in my ears, I see bugs crawling on wall, the couch is breathing, I see butterfly's crawling on the wall"  Hx of bipolar and depression Zyprexa '10mg'$  added to meds 2 weeks ago Denies drug and etoh use.

## 2022-05-03 NOTE — H&P (Addendum)
Behavioral Health Medical Screening Exam  [name] is a 56 y.o. male with history of anxiety, depression, bipolar disorder, chronic pain syndrome, hypertension, hyperlipidemia and hypothyroidism.  Patient presented voluntarily to [facility] reporting visual/auditory hallucination and paranoia. Patient is accompanied by his wife, [name]; he gave verbal consent for her to be present and to participate in his assessment.   Patient says he was evaluated at [facility] today for hallucination and paranoia. He says he was recommended for inpatient psychiatric admission. He says while at [facility], the doctor refused to prescribed him the correct dose of his pain medication. He says he is supposed to take Oxycodone '20mg'$  4 times per day however he was only prescribed '10mg'$ /day by the ED doctor. Patient says he became frustrated with ED doctor and nurses and contacted his wife to pick him up from the ED. He is endorsing auditory hallucination of conversations from upstairs in his neighbor's apartment. He is also endorsing visual hallucination of bugs flying towards him and tactile hallucination of bugs crawling on him. He says he feels paranoia at times because people from TV are watching him. He denies suicidal ideation, homicidal ideation, substance use (UDS in the ED was negative for illicit substance). He rates his back pain 7/10, 10 is worse pain. He denies any other complaint at this time. He says he lives at home with his wife.   Patient's wife confirmed his account of events that held to his presentation to [facility]. She says patient was frustrated with his pain medication not being ordered correctly. She says she was informed by ED nurse that patient was not under involuntary commitment so she requested for him to be discharge and she then transported him to [facility] for inpatient psych admission.  She denies that patient has made any suicidal or homicidal comments.  She reports the patient is  compliant with his medications.   During evaluation, patient is alert and oriented x 4.  Patient is anxious but cooperative.  He is speaking in a clear tone of voice at moderate rate with good eye contact.  His thought process is coherent.  His mood is anxious and irritable, affect is congruent with his mood, thought process consist of paranoia.  He did not appear to be responding to any internal/external stimuli.   This NP contacted [facility]'s AC Lavell Luster, RN for inpatient psychiatric bed. Unfortunately [facility] does not have an appropriate bed for patient at this time.  Discussed transfer to [facility] or [facility] for continuous assessment while awaiting inpatient psychiatric bed availability with patient and his wife. Patient says he does not want to return to [facility] or be admitted to [facility]. He says he wants to go home and take his medications, get some rest, and follow up with his doctor on Monday. He says he is able to contract for safety. His wife denies safety concerns and contracted for his safety.   No evidence of imminent danger to self or others at the time of this assessment. Patient does not meet criteria for IVC at this time. Supportive therapy provided about ongoing stressors.  Discussed crisis/safety plan, and encouraged patient to go to the nearest ED, call [phone] or go to [facility] if his symptoms worsen or if unable to maintain safety. Patient and his wife verbalized understanding of safety/crisis planning. Information for [facility] was provided to patient and his wife.   Total Time spent with patient: 30 minutes  Psychiatric Specialty Exam:  Presentation  General Appearance:  Appropriate for Environment  Eye Contact: Good  Speech: Clear and Coherent  Speech Volume: Decreased  Handedness: Right   Mood and Affect  Mood: Irritable  Affect: Congruent   Thought Process  Thought Processes: Coherent  Descriptions of Associations:Intact  Orientation:Full (Time, Place  and Person)  Thought Content:Paranoid Ideation  History of Schizophrenia/Schizoaffective disorder:No  Duration of Psychotic Symptoms:N/A  Hallucinations:Hallucinations: Auditory; Visual Description of Auditory Hallucinations: "conservation from upstairs apartment" Description of Visual Hallucinations: "Bugs flying and crwling of me"  Ideas of Reference:Paranoia  Suicidal Thoughts:Suicidal Thoughts: No  Homicidal Thoughts:Homicidal Thoughts: No   Sensorium  Memory: Immediate Good; Recent Good; Remote Good  Judgment: Fair  Insight: Present   Executive Functions  Concentration: Fair  Attention Span: Fair  Recall: Good  Fund of Knowledge: Good  Language: Good   Psychomotor Activity  Psychomotor Activity: Psychomotor Activity: Normal   Assets  Assets: Communication Skills; Desire for Improvement; Housing; Social Support; Transportation   Sleep  Sleep: Sleep: Poor Number of Hours of Sleep: 5    Physical Exam: Physical Exam Vitals reviewed.  Constitutional:      General: He is not in acute distress.    Appearance: He is not ill-appearing.  HENT:     Head: Normocephalic and atraumatic.     Right Ear: External ear normal.     Left Ear: External ear normal.     Nose: Nose normal. No congestion or rhinorrhea.  Eyes:     General:        Right eye: No discharge.        Left eye: No discharge.  Cardiovascular:     Rate and Rhythm: Normal rate.     Pulses: Normal pulses.  Pulmonary:     Effort: Pulmonary effort is normal. No respiratory distress.     Breath sounds: No wheezing or rhonchi.  Chest:     Chest wall: No tenderness.  Abdominal:     General: There is no distension.  Musculoskeletal:     Cervical back: Normal range of motion.     Comments: Ambulates with a cane, back pain 7/10  Skin:    General: Skin is warm.  Neurological:     Mental Status: He is alert and oriented to person, place, and time.  Psychiatric:        Attention  and Perception: Attention normal. He is attentive. He does not perceive auditory or visual hallucinations.        Mood and Affect: Mood is anxious.        Speech: Speech normal.        Behavior: Behavior normal. Behavior is cooperative.        Thought Content: Thought content is not paranoid or delusional. Thought content does not include homicidal or suicidal ideation. Thought content does not include homicidal or suicidal plan.        Cognition and Memory: Cognition normal.    Review of Systems  Constitutional: Negative.   HENT: Negative.    Eyes: Negative.   Respiratory: Negative.    Cardiovascular: Negative.   Gastrointestinal: Negative.   Genitourinary: Negative.   Musculoskeletal:        Ambulate with a cane   Skin: Negative.   Neurological: Negative.   Endo/Heme/Allergies: Negative.   Psychiatric/Behavioral:  Positive for hallucinations. The patient is nervous/anxious.    Blood pressure (P) 127/79, pulse (P) 96, temperature (P) 98.7 F (37.1 C), temperature source (P) Oral, resp. rate (P) 16, SpO2 (P) 96 %. There is no height  or weight on file to calculate BMI.  Musculoskeletal: Strength & Muscle Tone: within normal limits Gait & Station: unsteady Patient leans: Right  [place] Scale:  Flowsheet Row OP Visit from 05/03/2022 in [facility] ED from 04/25/2022 in [place] DEPT ED from 01/24/2022 in [place] No Risk No Risk No Risk       Recommendations:  Based on my evaluation the patient does not appear to have an emergency medical condition.  Ophelia Shoulder, NP 05/03/2022, 11:30 PM

## 2022-05-03 NOTE — H&P (Incomplete)
Behavioral Health Medical Screening Exam  [name] is a 56 y.o. male with history of anxiety, depression, bipolar disorder, chronic pain syndrome, hypertension, hyperlipidemia and hypothyroidism.  Patient presented voluntarily to [facility] reporting visual/auditory hallucination and paranoia.   Patient says he was evaluated at [facility] today and recommended for inpatient psychiatric admission. He says while at [facility], the doctor refused to prescribed him the correct dose of his pain medication. He says he is supposed take Oxycodone '20mg'$  4 times per day however he was only prescribed '10mg'$ /  Total Time spent with patient: {Time; 15 min - 8 hours:17441}  Psychiatric Specialty Exam:  Presentation  General Appearance:  Appropriate for Environment  Eye Contact: Good  Speech: Clear and Coherent  Speech Volume: Decreased  Handedness: Right   Mood and Affect  Mood: Irritable  Affect: Congruent   Thought Process  Thought Processes: Coherent  Descriptions of Associations:Intact  Orientation:Full (Time, Place and Person)  Thought Content:Paranoid Ideation  History of Schizophrenia/Schizoaffective disorder:No  Duration of Psychotic Symptoms:N/A  Hallucinations:Hallucinations: Auditory; Visual Description of Auditory Hallucinations: "conservation from upstairs apartment" Description of Visual Hallucinations: "Bugs flying and crwling of me"  Ideas of Reference:Paranoia  Suicidal Thoughts:Suicidal Thoughts: No  Homicidal Thoughts:Homicidal Thoughts: No   Sensorium  Memory: Immediate Good; Recent Good; Remote Good  Judgment: Fair  Insight: Present   Executive Functions  Concentration: Fair  Attention Span: Fair  Recall: Good  Fund of Knowledge: Good  Language: Good   Psychomotor Activity  Psychomotor Activity: Psychomotor Activity: Normal   Assets  Assets: Communication Skills; Desire for Improvement; Housing;  Social Support; Transportation   Sleep  Sleep: Sleep: Poor Number of Hours of Sleep: 5    Physical Exam: Physical Exam ROS Blood pressure (P) 127/79, pulse (P) 96, temperature (P) 98.7 F (37.1 C), temperature source (P) Oral, resp. rate (P) 16, SpO2 (P) 96 %. There is no height or weight on file to calculate BMI.  Musculoskeletal: Strength & Muscle Tone: {desc; muscle tone:32375} Gait & Station: {PE GAIT ED WNIO:27035} Patient leans: {Patient Leans:21022755}  [place] Scale:  Flowsheet Row OP Visit from 05/03/2022 in [place] ED from 04/25/2022 in [place] DEPT ED from 01/24/2022 in [place] No Risk No Risk No Risk       Recommendations:  {[facility] MSE Recommendations:304701}  Ophelia Shoulder, NP 05/03/2022, 11:30 PM

## 2022-05-03 NOTE — ED Provider Notes (Signed)
[facility] DEPT Provider Note   CSN: [id number] Arrival date & time: 05/03/22  1635     History  Chief Complaint  Patient presents with   Psychiatric Evaluation    [name] is a 56 y.o. male.  Patient has a history of bipolar anxiety and he presents today stating that he has been having auditory and visual hallucinations and he is paranoid.  This has happened before  The history is provided by the patient and medical records. No language interpreter was used.  Altered Mental Status Presenting symptoms: behavior changes   Presenting symptoms: no confusion   Severity:  Moderate Most recent episode:  More than 2 days ago Episode history:  Continuous Timing:  Constant Progression:  Waxing and waning Chronicity:  Recurrent Context: not alcohol use   Associated symptoms: hallucinations   Associated symptoms: no abdominal pain, no headaches, no rash and no seizures        Home Medications Prior to Admission medications   Medication Sig Start Date End Date Taking? Authorizing Provider  amantadine (SYMMETREL) 100 MG capsule Take 1 capsule (100 mg total) by mouth 2 (two) times daily. 07/16/21   Massengill, Ovid Curd, MD  amantadine (SYMMETREL) 100 MG capsule Take 2 capsules (200 mg total) by mouth daily. 02/01/22     amantadine (SYMMETREL) 100 MG capsule Take1 capsule by mouth two times daily 10/07/21     busPIRone (BUSPAR) 10 MG tablet Take 1 tablet by mouth 3 times daily. 08/01/21     busPIRone (BUSPAR) 10 MG tablet Take 1 tablet by mouth 3 times a day 08/07/21     busPIRone (BUSPAR) 10 MG tablet Take 1 tablet by mouth 3 times daily. 09/26/21     busPIRone (BUSPAR) 10 MG tablet Take 1 tablet by mouth 3 times a day 12/05/21     busPIRone (BUSPAR) 10 MG tablet Take 1 tablet (10 mg total) by mouth 3 (three) times daily. 02/20/22     busPIRone (BUSPAR) 10 MG tablet Take 1 tablet (10 mg total) by mouth 3 (three) times daily. 04/03/22     diclofenac Sodium  (VOLTAREN) 1 % GEL Apply 2 - 4 grams to painful joints up to 4 times daily if needed (max daily dose: 32 Gram) 03/03/22     diclofenac Sodium (VOLTAREN) 1 % GEL Apply 2-4 g topically 4 (four) times daily as needed. 04/04/22     diclofenac Sodium (VOLTAREN) 1 % GEL Apply 2 - 4 grams topically to painful joints up to 4 times daily as needed. Max daily dose: 32 grams. 04/29/22     dicyclomine (BENTYL) 10 MG capsule Take 1 (one) capsule by mouth every eight hours as needed 01/23/22     DULoxetine (CYMBALTA) 60 MG capsule Take 1 capsule (60 mg total) by mouth daily. 09/26/21     DULoxetine HCl 40 MG CPEP Take 1 capsule by mouth once every day 12/05/21     DULoxetine HCl 40 MG CPEP Take one capsule by mouth daily. 02/20/22     DULoxetine HCl 40 MG CPEP Take 1 capsule by mouth daily. 04/03/22     ergocalciferol (VITAMIN D2) 1.25 MG (50000 UT) capsule Take 1 capsule (50,000 Units total) by mouth once a week. 04/02/22     esomeprazole (NEXIUM) 40 MG capsule Take 1 capsule by mouth daily 08/07/21     esomeprazole (NEXIUM) 40 MG capsule Take 1 capsule (40 mg total) by mouth daily. 02/24/22     Evolocumab (REPATHA SURECLICK) 299 MG/ML  SOAJ Inject 1 auto-injector into the skin twice a month 08/07/21     Evolocumab (REPATHA SURECLICK) 458 MG/ML SOAJ Inject 1 pen 2 times a month 09/05/21     Evolocumab (REPATHA SURECLICK) 099 MG/ML SOAJ Inject 1 syringe twice a month 10/04/21     Evolocumab (REPATHA SURECLICK) 833 MG/ML SOAJ Inject 1 pen under the skin twice a month 11/05/21     Evolocumab (REPATHA SURECLICK) 825 MG/ML SOAJ Inject 43m twice a month. 12/05/21     Evolocumab (REPATHA SURECLICK) 1053MG/ML SOAJ Inject 1 pen under the skin every 2 weeks as directed 01/06/22     Evolocumab (REPATHA SURECLICK) 1976MG/ML SOAJ Inject 1 mL every 2 weeks. 02/04/22     Evolocumab (REPATHA SURECLICK) 1734MG/ML SOAJ Inject 140 mg into the skin every 14 (fourteen) days. 04/04/22     famotidine (PEPCID) 20 MG tablet Take 1 (one) tablet by mouth two times  daily 02/10/22     hydrOXYzine (ATARAX) 25 MG tablet Take 1 tablet (25 mg total) by mouth 3 (three) times daily as needed for anxiety. 07/16/21   Massengill, NOvid Curd MD  hydrOXYzine (ATARAX) 25 MG tablet Take 1 tablet by mouth 3 times every day as needed for anxiety. 08/01/21     hydrOXYzine (ATARAX) 25 MG tablet Take 1 tablet by mouth 3 times a day as needed 08/07/21     hydrOXYzine (ATARAX) 25 MG tablet Take 1 tablet (25 mg total) by mouth 3 (three) times daily as needed for anxiety 09/26/21     hydrOXYzine (ATARAX) 25 MG tablet Take 1 tablet by mouth 3 times every day as needed for Anxiety 12/05/21     hydrOXYzine (ATARAX) 25 MG tablet Take 1 tablet (25 mg total) by mouth 3 (three) times daily. 02/20/22     hydrOXYzine (ATARAX) 25 MG tablet Take 1 tablet (25 mg total) by mouth 3 (three) times daily. 04/03/22     levothyroxine (SYNTHROID) 50 MCG tablet Take 1 tablet (50 mcg total) by mouth daily. 02/24/22     lidocaine (LIDODERM) 5 % Place 1 patch onto the skin daily. Remove & Discard patch within 12 hours or as directed by MD 04/25/22   PRandal Buba April, MD  methocarbamol (ROBAXIN) 500 MG tablet Take 1 tablet by mouth every six hours as needed for pain and spasm Patient not taking: Reported on 07/13/2021 03/07/21     methocarbamol (ROBAXIN) 750 MG tablet Take 1 tablet (750 mg total) by mouth 4 (four) times daily as needed. 03/02/22     naloxone (NARCAN) nasal spray 4 mg/0.1 mL Use 1 spray in nose as directed Patient not taking: Reported on 07/13/2021 04/17/21     nicotine polacrilex (COMMIT) 4 MG lozenge Take 4 mg by mouth as needed for smoking cessation.    [provider]  nicotine polacrilex (SM NICOTINE POLACRILEX) 4 MG lozenge Take 1 lozenge (4 mg total) by mouth every 2 (two) hours as needed for smoking cessation. 07/16/21   Massengill, NOvid Curd MD  ondansetron (ZOFRAN) 4 MG tablet Take 1  tablet by mouth four times daily as needed 08/07/21     ondansetron (ZOFRAN) 4 MG tablet Take 1 tablet (4 mg  total) by mouth 4 (four) times daily as needed. 04/04/22     Oxycodone HCl 20 MG TABS Take 1 tablet by mouth 4 times daily 04/28/22     pantoprazole (PROTONIX) 40 MG tablet Take 1 tablet (40 mg total) by mouth daily. 07/17/21   NNicholes Rough NP  propranolol  ER (INDERAL LA) 60 MG 24 hr capsule Take 1 capsule (60 mg total) by mouth daily. 07/17/21   Massengill, Ovid Curd, MD  propranolol ER (INDERAL LA) 60 MG 24 hr capsule Take 1 capsule by mouth daily 08/07/21     QUEtiapine (SEROQUEL XR) 200 MG 24 hr tablet Take 1 tablet by mouth in the evening on an empty stomach or with a light meal. 08/01/21     QUEtiapine (SEROQUEL XR) 200 MG 24 hr tablet Take 1 tablet by mouth every day in the evening without food or with a light meal 09/26/21     QUEtiapine (SEROQUEL XR) 300 MG 24 hr tablet Take 1 tablet by mouth every day in the evening without food or with a light meal 10/08/21     QUEtiapine (SEROQUEL XR) 400 MG 24 hr tablet Take 1 tablet by mouth once in the evening without food or with a light meal 12/05/21     QUEtiapine (SEROQUEL XR) 400 MG 24 hr tablet Take 1 tablet (400 mg total) by mouth daily in the evening without food or with a light meal. 02/20/22     QUEtiapine (SEROQUEL XR) 400 MG 24 hr tablet Take 1 tablet (400 mg total) by mouth every evening without food or with a light meal. 04/03/22     QUEtiapine Fumarate (SEROQUEL XR) 150 MG 24 hr tablet Take 1 tablet by mouth at bedtime 08/07/21     QUEtiapine Fumarate (SEROQUEL XR) 150 MG 24 hr tablet Take 1 tablet (150 mg total) by mouth every evening without food or with a light meal. 02/20/22     QUEtiapine Fumarate (SEROQUEL XR) 150 MG 24 hr tablet Take 1 tablet by mouth every evening without food or with a light meal. 04/03/22     traZODone (DESYREL) 50 MG tablet Take 1 tablet by mouth every day at bedtime. 08/01/21     traZODone (DESYREL) 50 MG tablet Take 1  tablet by mouth at bedtime 08/07/21     traZODone (DESYREL) 50 MG tablet Take 1 tablet by mouth at bedtime.  09/26/21     traZODone (DESYREL) 50 MG tablet Take 1 tablet by mouth once at bedtime 12/05/21     traZODone (DESYREL) 50 MG tablet Take 1 tablet (50 mg total) by mouth at bedtime. 02/20/22     traZODone (DESYREL) 50 MG tablet Take 1 tablet (50 mg total) by mouth at bedtime. 04/03/22     Doxepin HCl 3 MG TABS Take 1 tablet by mouth every evening 30 minutes before bedtime on an empty stomach 05/23/21 06/06/21    loxapine (LOXITANE) 25 MG capsule Take 1 capsule (25 mg total) by mouth 2 (two) times daily. 04/03/22 04/10/22        Allergies    Zolpidem tartrate, Lyrica [pregabalin], and Demerol [meperidine]    Review of Systems   Review of Systems  Constitutional:  Negative for appetite change and fatigue.  HENT:  Negative for congestion, ear discharge and sinus pressure.   Eyes:  Negative for discharge.  Respiratory:  Negative for cough.   Cardiovascular:  Negative for chest pain.  Gastrointestinal:  Negative for abdominal pain and diarrhea.  Genitourinary:  Negative for frequency and hematuria.  Musculoskeletal:  Negative for back pain.  Skin:  Negative for rash.  Neurological:  Negative for seizures and headaches.  Psychiatric/Behavioral:  Positive for hallucinations. Negative for confusion.     Physical Exam Updated Vital Signs BP (!) 162/115   Pulse 76   Temp 98.7 F (  37.1 C) (Oral)   Resp 18   SpO2 97%  Physical Exam Vitals and nursing note reviewed.  Constitutional:      Appearance: He is well-developed.  HENT:     Head: Normocephalic.     Nose: Nose normal.  Eyes:     General: No scleral icterus.    Conjunctiva/sclera: Conjunctivae normal.  Neck:     Thyroid: No thyromegaly.  Cardiovascular:     Rate and Rhythm: Normal rate and regular rhythm.     Heart sounds: No murmur heard.    No friction rub. No gallop.  Pulmonary:     Breath sounds: No stridor. No wheezing or rales.  Chest:     Chest wall: No tenderness.  Abdominal:     General: There is no distension.      Tenderness: There is no abdominal tenderness. There is no rebound.  Musculoskeletal:        General: Normal range of motion.     Cervical back: Neck supple.  Lymphadenopathy:     Cervical: No cervical adenopathy.  Skin:    Findings: No erythema or rash.  Neurological:     Mental Status: He is alert and oriented to person, place, and time.     Motor: No abnormal muscle tone.     Coordination: Coordination normal.  Psychiatric:     Comments: Patient having visual and auditory hallucination     ED Results / Procedures / Treatments   Labs (all labs ordered are listed, but only abnormal results are displayed) Labs Reviewed  BASIC METABOLIC PANEL - Abnormal; Notable for the following components:      Result Value   Glucose, Bld 108 (*)    BUN 21 (*)    All other components within normal limits  RAPID URINE DRUG SCREEN, HOSP PERFORMED - Abnormal; Notable for the following components:   Opiates POSITIVE (*)    All other components within normal limits  RESP PANEL BY RT-PCR (FLU A&B, COVID) ARPGX2  CBC WITH DIFFERENTIAL/PLATELET  ETHANOL    EKG None  Radiology No results found.  Procedures Procedures    Medications Ordered in ED Medications  QUEtiapine (SEROQUEL XR) 24 hr tablet 400 mg (has no administration in time range)  QUEtiapine (SEROQUEL XR) 24 hr tablet 150 mg (has no administration in time range)  oxyCODONE-acetaminophen (PERCOCET/ROXICET) 5-325 MG per tablet 2 tablet (has no administration in time range)  LORazepam (ATIVAN) tablet 1 mg (has no administration in time range)  LORazepam (ATIVAN) tablet 1 mg (1 mg Oral Given 05/03/22 1805)    ED Course/ Medical Decision Making/ A&P    This patient presents to the ED for concern of visual and auditory hallucinations, this involves an extensive number of treatment options, and is a complaint that carries with it a high risk of complications and morbidity.  The differential diagnosis includes psychiatric illness,  metabolic problem   Co morbidities that complicate the patient evaluation  History of bipolar   Additional history obtained:  Additional history obtained from family External records from outside source obtained and reviewed including hospital records   Lab Tests:  I Ordered, and personally interpreted labs.  The pertinent results include: Drug screen positive for opiates   Imaging Studies ordered:  No imaging  Cardiac Monitoring: / EKG:  The patient was maintained on a cardiac monitor.  I personally viewed and interpreted the cardiac monitored which showed an underlying rhythm of: Normal sinus rhythm   Consultations Obtained:  I requested consultation  with the behavioral health,  and discussed lab and imaging findings as well as pertinent plan - they recommend: Admit to behavioral health once medically cleared   Problem List / ED Course / Critical interventions / Medication management  Anxiety, bipolar, hallucinations I ordered medication including Percocet for pain Reevaluation of the patient after these medicines showed that the patient improved I have reviewed the patients home medicines and have made adjustments as needed   Social Determinants of Health:  Bipolar   Test / Admission - Considered:  None        Patient is medically cleared and can go to a psychiatric bed                         Medical Decision Making Amount and/or Complexity of Data Reviewed Labs: ordered.  Risk Prescription drug management.   Patient with visual and auditory hallucinations.  He will be admitted by behavioral health.        Final Clinical Impression(s) / ED Diagnoses Final diagnoses:  None    Rx / DC Orders ED Discharge Orders     None         Milton Ferguson, MD 05/03/22 2015

## 2022-05-03 NOTE — Consult Note (Signed)
[place] ED ASSESSMENT   Reason for Consult:  [place] Referring Physician:  Denton Meek, MD Patient Identification: [name] MRN:  [id number] ED Chief Complaint: Bipolar I disorder, most recent episode (or current) manic ([place])  Diagnosis:  Principal Problem:   Bipolar I disorder, most recent episode (or current) manic [facility])   ED Assessment Time Calculation: Start Time: 1650 Stop Time: 1710 Total Time in Minutes (Assessment Completion): 20   Subjective:   [name] is a 56 y.o. male patient with a history of bipolar I disorder who presents to [facility] due to worsening anxiety, decreased sleep, and AVH.  HPI:   Patient reports that over the past 2 to 3 days he has started hearing voices coming from the upstairs apartment.  States he should not be able to hear people after talking.  He states that he has been conversing with these voices.  States that he also sees bugs flying toward him and butterflies on the wall.  States that he feels paranoid that people and the TV are trying to get him so he covered his TV with a sheet.  He reports that he is sleeping approximately 2 hours a day.  He reports that he is followed by [facility] in [place].  States that he is currently prescribed Seroquel 550 mg nightly and was started on olanzapine 10 mg nightly recently 2 weeks ago.  States that he has also prescribed duloxetine.  He denies suicidal ideations.  He denies homicidal ideations.  He denies use of alcohol and other substances.  On evaluation, patient is sitting in chair.  He is visibly anxious.  Eye contact is good.  Constantly shaking his legs.  Speech is clear and coherent, increased in volume, and slightly tangential.  He reports his mood is anxious.  Affect is congruent with mood.  His thought process is coherent and slightly tangential.  Thought content consist of paranoia.  He reports auditory hallucinations of voices coming from upstairs.  He reports visual hallucinations of bugs and butterflies.   No indication that he is currently responding to internal stimuli.  He denies suicidal ideation.  He denies homicidal ideations.  Past Psychiatric History: Bipolar disorder.  He was last psychiatrically hospitalized at [facility] February 2023.  During that admission he was treated for a depressive episode.  He was discharged on Seroquel 150 mg nightly, hydroxyzine 25 mg every 6 hours as needed, BuSpar 10 mg 3 times daily, amantadine 100 mg twice daily, trazodone 50 mg nightly.  Reports these medications have adjusted or discontinued by his outpatient provider.  Risk to Self or Others: Is the patient at risk to self? No Has the patient been a risk to self in the past 6 months? No Has the patient been a risk to self within the distant past? Yes Is the patient a risk to others? No Has the patient been a risk to others in the past 6 months? No Has the patient been a risk to others within the distant past? No  [place] Scale:  [place] ED from 04/25/2022 in [place] DEPT ED from 01/24/2022 in [place] Admission (Discharged) from 07/13/2021 in [place] 400B  C-SSRS RISK CATEGORY No Risk No Risk Low Risk       AIMS:  , , ,  ,   ASAM:    Substance Abuse:     Past Medical History:  Past Medical History:  Diagnosis Date  Accelerated hypertension 08/23/2016   pt denies this   Anxiety    Arthritis    Bipolar affective disorder ([place])    Chronic back pain    Chronic kidney disease 2017   AKF - due to Rhabdomyolysis   Coronary artery disease    minimal Mid LAD to Dist LAD lesion, 35% stenosed. Prox Cx lesion, 40% stenosed.   DDD (degenerative disc disease)    Depression    DJD (degenerative joint disease)    Elevated liver function tests    Fibromyalgia    GERD (gastroesophageal reflux disease)    Head injury    Head injury, closed, with concussion    x 3  from falls    Hyperlipidemia    Hypothyroidism    IBS (irritable bowel syndrome)    with constapation   Inguinal hernia    left   Insomnia    Myofascial pain syndrome    Osteopenia    Rhabdomyolysis    Shortness of breath dyspnea    with exertion   Tibial plateau fracture, left    Tremors of nervous system     Past Surgical History:  Procedure Laterality Date   ANTERIOR CERVICAL DECOMP/DISCECTOMY FUSION N/A 09/21/2017   Procedure: ANTERIOR CERVICAL DECOMPRESSION/DISCECTOMY FUSION - CERVICAL THREE-CERVICAL FOUR;  Surgeon: Kary Kos, MD;  Location: [place];  Service: Neurosurgery;  Laterality: N/A;   ANTERIOR CERVICAL DECOMP/DISCECTOMY FUSION N/A 12/23/2017   Procedure: REMOVAL AND REPLACEMENT OF CERVICAL THREE-FOUR HARDWARE;  Surgeon: Kary Kos, MD;  Location: [place];  Service: Neurosurgery;  Laterality: N/A;   CARDIAC CATHETERIZATION N/A 08/31/2015   Procedure: Left Heart Cath and Coronary Angiography;  Surgeon: Leonie Man, MD;  Location: [place] CV [facility];  Service: Cardiovascular;  Laterality: N/A;   Carotid Dopplers Bilateral 02/20/2015   [facility]: Mild, less than 39% left and right internal carotid artery stenosis. No significant plaque burden   CERVICAL DISC SURGERY     C5-7   COLONOSCOPY     COLONOSCOPY     ESOPHAGOGASTRODUODENOSCOPY     FASCIOTOMY Left 10/20/2013   Procedure: LEFT leg ANTERIOR COMPARTMENT FACSCIOTOMY;  Surgeon: Rozanna Box, MD;  Location: [place];  Service: Orthopedics;  Laterality: Left;   INGUINAL HERNIA REPAIR Left    Nuclear Stress Test  03/06/2015   [facility]: Normal EKG. Low normal EF (49%) normal regional wall motion. No evidence of ischemia or infarction.   ORIF TIBIA PLATEAU Left 10/20/2013   Procedure: OPEN REDUCTION INTERNAL FIXATION (ORIF) LEFT TIBIAL PLATEAU;  Surgeon: Rozanna Box, MD;  Location: [place];  Service: Orthopedics;  Laterality: Left;   SEPTOPLASTY  2010   SPINAL CORD STIMULATOR BATTERY EXCHANGE N/A  02/08/2016   Procedure: Lumbar spinal cord stimulator implantable pulse generator replacement;  Surgeon: Clydell Hakim, MD;  Location: [place] [facility];  Service: Neurosurgery;  Laterality: N/A;   SPINAL CORD STIMULATOR BATTERY EXCHANGE N/A 12/23/2017   Procedure: SPINAL CORD STIMULATOR BATTERY EXCHANGE;  Surgeon: Clydell Hakim, MD;  Location: [place];  Service: Neurosurgery;  Laterality: N/A;   SPINAL CORD STIMULATOR IMPLANT     TOTAL HIP ARTHROPLASTY Right 03/15/2019   Procedure: RIGHT TOTAL HIP ARTHROPLASTY ANTERIOR APPROACH;  Surgeon: Melrose Nakayama, MD;  Location: [facility];  Service: Orthopedics;  Laterality: Right;   TOTAL HIP ARTHROPLASTY Left 03/05/2021   Procedure: LEFT TOTAL HIP ARTHROPLASTY ANTERIOR APPROACH;  Surgeon: Melrose Nakayama, MD;  Location: [facility];  Service: Orthopedics;  Laterality: Left;   TRANSTHORACIC ECHOCARDIOGRAM  02/20/2015  [facility]: Mild concentric LVH. EF 55-60%. Normal regional wall motion. Mild to moderate TR with no significant pulmonary hypertension.   Family History:  Family History  Problem Relation Age of Onset   Heart disease Mother    Hypertension Mother    Sudden death Mother        Presumably cardiac   Hyperlipidemia Father    Heart attack Father 64       At least 5 MIs. Had CABG.   Heart failure Father    Diabetes Father    Hypertension Father    Kidney disease Father    Diabetes Brother    Lung cancer Maternal Grandmother    Diabetes Paternal Grandmother    Sudden death Paternal Grandmother        Unclear etiology   Diabetes Other    Kidney disease Other    Kidney disease Brother    Hypertension Brother    Sudden death Brother        Thought to be related to heart disease   Throat cancer Brother    Colon cancer Neg Hx    Mental illness Neg Hx    Social History:  Social History   Substance and Sexual Activity  Alcohol Use No   Alcohol/week: 0.0 standard drinks of alcohol   Comment: none since 1999- heavy drinker in past      Social History   Substance and Sexual Activity  Drug Use Not Currently   Types: Oxycodone, Cocaine   Comment: rarely- last 2015-    Social History   Socioeconomic History   Marital status: Legally Separated    Spouse name: Not on file   Number of children: 1   Years of education: Not on file   Highest education level: Not on file  Occupational History   Occupation: disabled  Tobacco Use   Smoking status: Every Day    Packs/day: 0.25    Years: 40.00    Total pack years: 10.00    Types: Cigarettes   Smokeless tobacco: Never  Vaping Use   Vaping Use: Never used  Substance and Sexual Activity   Alcohol use: No    Alcohol/week: 0.0 standard drinks of alcohol    Comment: none since 1999- heavy drinker in past   Drug use: Not Currently    Types: Oxycodone, Cocaine    Comment: rarely- last 2015-   Sexual activity: Not on file  Other Topics Concern   Not on file  Social History Narrative   He is a separated father of 1. His daughter is married and recent OT graduate living in [place].   He is currently disabled due to chronic back pain.    He did some college education.   He currently is trying to use electronic cigarettes but has smoked one pack a day for over 35 years.   He has recently stopped using prescription benzodiazepine and pain medications because he is in the process of changing pain med doctors.   He does not routinely exercise secondary to back, neck pain as well as dyspnea.   Social Determinants of Health   Financial Resource Strain: Not on file  Food Insecurity: Not on file  Transportation Needs: Not on file  Physical Activity: Not on file  Stress: Not on file  Social Connections: Not on file   Additional Social History:    Allergies:   Allergies  Allergen Reactions   Zolpidem Tartrate Other (See Comments)    Hallucinations and sleep walks  Lyrica [Pregabalin] Other (See Comments)    Caused extreme depression   Demerol [Meperidine]  Itching, Rash and Hives    Labs: No results found for this or any previous visit (from the past 48 hour(s)).  Current Facility-Administered Medications  Medication Dose Route Frequency Provider Last Rate Last Admin   LORazepam (ATIVAN) tablet 1 mg  1 mg Oral Once Rozetta Nunnery, NP       Current Outpatient Medications  Medication Sig Dispense Refill   amantadine (SYMMETREL) 100 MG capsule Take 1 capsule (100 mg total) by mouth 2 (two) times daily. 60 capsule 0   amantadine (SYMMETREL) 100 MG capsule Take 2 capsules (200 mg total) by mouth daily. 180 capsule 0   amantadine (SYMMETREL) 100 MG capsule Take1 capsule by mouth two times daily 60 capsule 0   busPIRone (BUSPAR) 10 MG tablet Take 1 tablet by mouth 3 times daily. 90 tablet 2   busPIRone (BUSPAR) 10 MG tablet Take 1 tablet by mouth 3 times a day 90 tablet 6   busPIRone (BUSPAR) 10 MG tablet Take 1 tablet by mouth 3 times daily. 90 tablet 3   busPIRone (BUSPAR) 10 MG tablet Take 1 tablet by mouth 3 times a day 90 tablet 3   busPIRone (BUSPAR) 10 MG tablet Take 1 tablet (10 mg total) by mouth 3 (three) times daily. 270 tablet 0   busPIRone (BUSPAR) 10 MG tablet Take 1 tablet (10 mg total) by mouth 3 (three) times daily. 270 tablet 0   diclofenac Sodium (VOLTAREN) 1 % GEL Apply 2 - 4 grams to painful joints up to 4 times daily if needed (max daily dose: 32 Gram) 300 g 0   diclofenac Sodium (VOLTAREN) 1 % GEL Apply 2-4 g topically 4 (four) times daily as needed. 300 g 0   diclofenac Sodium (VOLTAREN) 1 % GEL Apply 2 - 4 grams topically to painful joints up to 4 times daily as needed. Max daily dose: 32 grams. 300 g 0   dicyclomine (BENTYL) 10 MG capsule Take 1 (one) capsule by mouth every eight hours as needed 90 capsule 2   DULoxetine (CYMBALTA) 60 MG capsule Take 1 capsule (60 mg total) by mouth daily. 60 capsule 2   DULoxetine HCl 40 MG CPEP Take 1 capsule by mouth once every day 30 capsule 3   DULoxetine HCl 40 MG CPEP Take one  capsule by mouth daily. 90 capsule 0   DULoxetine HCl 40 MG CPEP Take 1 capsule by mouth daily. 90 capsule 0   ergocalciferol (VITAMIN D2) 1.25 MG (50000 UT) capsule Take 1 capsule (50,000 Units total) by mouth once a week. 4 capsule 1   esomeprazole (NEXIUM) 40 MG capsule Take 1 capsule by mouth daily 90 capsule 1   esomeprazole (NEXIUM) 40 MG capsule Take 1 capsule (40 mg total) by mouth daily. 90 capsule 1   Evolocumab (REPATHA SURECLICK) 767 MG/ML SOAJ Inject 1 auto-injector into the skin twice a month 2 mL 12   Evolocumab (REPATHA SURECLICK) 341 MG/ML SOAJ Inject 1 pen 2 times a month 2 mL 12   Evolocumab (REPATHA SURECLICK) 937 MG/ML SOAJ Inject 1 syringe twice a month 2 mL 12   Evolocumab (REPATHA SURECLICK) 902 MG/ML SOAJ Inject 1 pen under the skin twice a month 2 mL 12   Evolocumab (REPATHA SURECLICK) 409 MG/ML SOAJ Inject 33m twice a month. 2 mL 12   Evolocumab (REPATHA SURECLICK) 1735MG/ML SOAJ Inject 1 pen under the skin every 2 weeks  as directed 2 mL 12   Evolocumab (REPATHA SURECLICK) 782 MG/ML SOAJ Inject 1 mL every 2 weeks. 2 mL 12   Evolocumab (REPATHA SURECLICK) 956 MG/ML SOAJ Inject 140 mg into the skin every 14 (fourteen) days. 2 mL 12   famotidine (PEPCID) 20 MG tablet Take 1 (one) tablet by mouth two times daily 90 tablet 1   hydrOXYzine (ATARAX) 25 MG tablet Take 1 tablet (25 mg total) by mouth 3 (three) times daily as needed for anxiety. 60 tablet 0   hydrOXYzine (ATARAX) 25 MG tablet Take 1 tablet by mouth 3 times every day as needed for anxiety. 90 tablet 2   hydrOXYzine (ATARAX) 25 MG tablet Take 1 tablet by mouth 3 times a day as needed 90 tablet 3   hydrOXYzine (ATARAX) 25 MG tablet Take 1 tablet (25 mg total) by mouth 3 (three) times daily as needed for anxiety 90 tablet 3   hydrOXYzine (ATARAX) 25 MG tablet Take 1 tablet by mouth 3 times every day as needed for Anxiety 90 tablet 3   hydrOXYzine (ATARAX) 25 MG tablet Take 1 tablet (25 mg total) by mouth 3 (three)  times daily. 270 tablet 0   hydrOXYzine (ATARAX) 25 MG tablet Take 1 tablet (25 mg total) by mouth 3 (three) times daily. 270 tablet 0   levothyroxine (SYNTHROID) 50 MCG tablet Take 1 tablet (50 mcg total) by mouth daily. 90 tablet 1   lidocaine (LIDODERM) 5 % Place 1 patch onto the skin daily. Remove & Discard patch within 12 hours or as directed by MD 30 patch 0   methocarbamol (ROBAXIN) 500 MG tablet Take 1 tablet by mouth every six hours as needed for pain and spasm (Patient not taking: Reported on 07/13/2021) 40 tablet 1   methocarbamol (ROBAXIN) 750 MG tablet Take 1 tablet (750 mg total) by mouth 4 (four) times daily as needed. 120 tablet 6   naloxone (NARCAN) nasal spray 4 mg/0.1 mL Use 1 spray in nose as directed (Patient not taking: Reported on 07/13/2021) 2 each 3   nicotine polacrilex (COMMIT) 4 MG lozenge Take 4 mg by mouth as needed for smoking cessation.     nicotine polacrilex (SM NICOTINE POLACRILEX) 4 MG lozenge Take 1 lozenge (4 mg total) by mouth every 2 (two) hours as needed for smoking cessation. 100 tablet 0   ondansetron (ZOFRAN) 4 MG tablet Take 1  tablet by mouth four times daily as needed 120 tablet 0   ondansetron (ZOFRAN) 4 MG tablet Take 1 tablet (4 mg total) by mouth 4 (four) times daily as needed. 120 tablet 0   Oxycodone HCl 20 MG TABS Take 1 tablet by mouth 4 times daily 120 tablet 0   pantoprazole (PROTONIX) 40 MG tablet Take 1 tablet (40 mg total) by mouth daily. 30 tablet 0   propranolol ER (INDERAL LA) 60 MG 24 hr capsule Take 1 capsule (60 mg total) by mouth daily. 30 capsule 0   propranolol ER (INDERAL LA) 60 MG 24 hr capsule Take 1 capsule by mouth daily 90 capsule 3   QUEtiapine (SEROQUEL XR) 200 MG 24 hr tablet Take 1 tablet by mouth in the evening on an empty stomach or with a light meal. 30 tablet 2   QUEtiapine (SEROQUEL XR) 200 MG 24 hr tablet Take 1 tablet by mouth every day in the evening without food or with a light meal 30 tablet 3   QUEtiapine  (SEROQUEL XR) 300 MG 24 hr tablet Take 1  tablet by mouth every day in the evening without food or with a light meal 30 tablet 3   QUEtiapine (SEROQUEL XR) 400 MG 24 hr tablet Take 1 tablet by mouth once in the evening without food or with a light meal 30 tablet 3   QUEtiapine (SEROQUEL XR) 400 MG 24 hr tablet Take 1 tablet (400 mg total) by mouth daily in the evening without food or with a light meal. 90 tablet 0   QUEtiapine (SEROQUEL XR) 400 MG 24 hr tablet Take 1 tablet (400 mg total) by mouth every evening without food or with a light meal. 90 tablet 0   QUEtiapine Fumarate (SEROQUEL XR) 150 MG 24 hr tablet Take 1 tablet by mouth at bedtime 30 tablet 0   QUEtiapine Fumarate (SEROQUEL XR) 150 MG 24 hr tablet Take 1 tablet (150 mg total) by mouth every evening without food or with a light meal. 90 tablet 0   QUEtiapine Fumarate (SEROQUEL XR) 150 MG 24 hr tablet Take 1 tablet by mouth every evening without food or with a light meal. 90 tablet 0   traZODone (DESYREL) 50 MG tablet Take 1 tablet by mouth every day at bedtime. 30 tablet 2   traZODone (DESYREL) 50 MG tablet Take 1  tablet by mouth at bedtime 90 tablet 3   traZODone (DESYREL) 50 MG tablet Take 1 tablet by mouth at bedtime. 30 tablet 3   traZODone (DESYREL) 50 MG tablet Take 1 tablet by mouth once at bedtime 30 tablet 3   traZODone (DESYREL) 50 MG tablet Take 1 tablet (50 mg total) by mouth at bedtime. 90 tablet 0   traZODone (DESYREL) 50 MG tablet Take 1 tablet (50 mg total) by mouth at bedtime. 90 tablet 0    Musculoskeletal: Strength & Muscle Tone: within normal limits Gait & Station: normal Patient leans: N/A   Psychiatric Specialty Exam: Presentation  General Appearance:  Appropriate for Environment; Fairly Groomed  Eye Contact: Good  Speech: Clear and Coherent; Normal Rate  Speech Volume: Increased  Handedness: Right   Mood and Affect  Mood: Anxious  Affect: Congruent   Thought Process  Thought  Processes: Coherent  Descriptions of Associations:Tangential  Orientation:Full (Time, Place and Person)  Thought Content:Tangential; Paranoid Ideation  History of Schizophrenia/Schizoaffective disorder:No  Duration of Psychotic Symptoms:N/A  Hallucinations:Hallucinations: Auditory; Visual Description of Auditory Hallucinations: hears voices from apartment above him. States the he concerses with the voices. States people live upstairs but he should not be able to hear them talking. Description of Visual Hallucinations: VH of bugs flying towards him and butterfliews on the walls.  Ideas of Reference:Paranoia  Suicidal Thoughts:Suicidal Thoughts: No  Homicidal Thoughts:Homicidal Thoughts: No   Sensorium  Memory: Immediate Good; Recent Good; Remote Good  Judgment: Intact  Insight: Present   Executive Functions  Concentration: Fair  Attention Span: Fair  Recall: Good  Fund of Knowledge: Good  Language: Fair   Psychomotor Activity  Psychomotor Activity: Psychomotor Activity: Restlessness   Assets  Assets: Communication Skills; Desire for Improvement; Housing    Sleep  Sleep: Sleep: Poor   Physical Exam: Physical Exam Constitutional:      General: He is not in acute distress.    Appearance: He is not ill-appearing, toxic-appearing or diaphoretic.  Eyes:     General:        Right eye: No discharge.        Left eye: No discharge.  Cardiovascular:     Rate and Rhythm: Normal rate.  Pulmonary:     Effort: Pulmonary effort is normal. No respiratory distress.  Musculoskeletal:        General: Normal range of motion.     Cervical back: Normal range of motion.  Neurological:     Mental Status: He is alert and oriented to person, place, and time.  Psychiatric:        Mood and Affect: Mood is anxious.        Behavior: Behavior is cooperative.        Thought Content: Thought content is paranoid. Thought content does not include homicidal or  suicidal ideation.    Review of Systems  Respiratory:  Negative for cough and shortness of breath.   Cardiovascular:  Negative for chest pain.  Gastrointestinal:  Negative for diarrhea, nausea and vomiting.  Psychiatric/Behavioral:  Positive for hallucinations. Negative for substance abuse and suicidal ideas. The patient is nervous/anxious and has insomnia.    Blood pressure (!) 137/97, pulse 88, temperature 98.7 F (37.1 C), temperature source Oral, resp. rate 16, SpO2 98 %. There is no height or weight on file to calculate BMI.  Medical Decision Making: [name] is a 56 y.o. male patient with a history of bipolar I disorder who presents to [facility] due to worsening anxiety, decreased sleep, and AVH. Patient reports that over the past 2 to 3 days he has started hearing voices coming from the upstairs apartment.  States he should not be able to hear people after talking.  He states that he has been conversing with these voices.  States that he also sees bugs flying toward him and butterflies on the wall.  States that he feels paranoid that people and the TV are trying to get him so he covered his TV with a sheet.  He reports that he is sleeping approximately 2 hours a day.  He reports that he is followed by [facility] in [place].  States that he is currently prescribed Seroquel 550 mg nightly and was started on olanzapine 10 mg nightly recently 2 weeks ago.  States that he has also prescribed duloxetine.  He denies suicidal ideations.  He denies homicidal ideations.  He denies use of alcohol and other substances.  At this time the patient is not medically cleared.  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  Recommend resuming home medications when medically cleared.  Ativan 1 mg oral x 1 dose for anxiety.   Disposition: Recommend psychiatric Inpatient admission when medically cleared.  Rozetta Nunnery, NP 05/03/2022 5:17 PM

## 2022-05-04 ENCOUNTER — Ambulatory Visit (HOSPITAL_COMMUNITY)
Admission: EM | Admit: 2022-05-04 | Discharge: 2022-05-04 | Disposition: A | Discharge status: Home / Self Care | Payer: Medicaid Other | Attending: Psychiatry | Admitting: Psychiatry

## 2022-05-04 DIAGNOSIS — Z79891 Long term (current) use of opiate analgesic: Secondary | ICD-10-CM | POA: Insufficient documentation

## 2022-05-04 DIAGNOSIS — R443 Hallucinations, unspecified: Secondary | ICD-10-CM

## 2022-05-04 DIAGNOSIS — Z9151 Personal history of suicidal behavior: Secondary | ICD-10-CM | POA: Insufficient documentation

## 2022-05-04 DIAGNOSIS — F22 Delusional disorders: Secondary | ICD-10-CM | POA: Insufficient documentation

## 2022-05-04 NOTE — Discharge Instructions (Signed)

## 2022-05-04 NOTE — ED Provider Notes (Signed)
Behavioral Health Urgent Care Medical Screening Exam  Patient Name: [name] MRN: [id number] Date of Evaluation: 05/04/22 Chief Complaint:   Diagnosis:  Final diagnoses:  Paranoia ([place])  Hallucination    History of Present illness: [name] is a 56 y.o. male.  Represented to [facility] urgent care accompanied by his wife.  K[place]ley reported tactile and auditory hallucinations.  Reports he is currently followed by therapy and psychiatry at [facility].  Reports he does not remember what happened most of yesterday.  Patient's wife reports patient was frustrated due to pain medications was cut in half so he requested to discharge.  He reports " my mental health is tied so my pain."  States he is currently prescribed oxycodone 20 mg 4 times a day.  Patient reports he is followed by pain management.   Maher was offered inpatient admission however advised pain medication could not be given at reported dosing.  Patient became frustrated and stated he will follow-up with [facility] on 05/05/2022 for reported symptoms.   Per patient and patient's wife patient had a previous suicide attempt, denying suicidal or homicidal ideations.  States he would like to get his medications adjusted due to onset of paranoia and hallucinations.  States he is currently prescribed Seroquel 550 mg of and 50 mg of trazodone.   During evaluation [name] is sititng in no acute distress. he is alert/oriented x 4; calm/cooperative and slightly irritable ; and mood congruent with affect. he is speaking in a clear tone at moderate volume, and normal pace; with good eye contact. His thought process is coherent and relevant; There is no indication that he is currently responding to internal/external stimuli or experiencing delusional thought content; and he has denied suicidal/self-harm/homicidal ideation,    Patient has remained calm throughout assessment and has answered questions appropriately.     At this time [name] is educated and verbalizes understanding of mental health resources and other crisis services in the community.He is instructed to call 911 and present to the nearest emergency room should he experience any suicidal/homicidal ideation, auditory/visual/hallucinations, or detrimental worsening of his mental health condition.he was a also advised by Probation officer that he could call the toll-free phone on back of  insurance card to assist with identifying in network counselors and agencies or number on back of Medicaid card to speak with care coordinator.    Flowsheet Row OP Visit from 05/03/2022 in [place] ED from 04/25/2022 in [place] DEPT ED from 01/24/2022 in [place] No Risk No Risk No Risk       Psychiatric Specialty Exam  Presentation  General Appearance:Appropriate for Environment  Eye Contact:Good  Speech:Clear and Coherent  Speech Volume:Decreased  Handedness:Right   Mood and Affect  Mood: Irritable  Affect: Congruent   Thought Process  Thought Processes: Coherent  Descriptions of Associations:Intact  Orientation:Full (Time, Place and Person)  Thought Content:Paranoid Ideation  Diagnosis of Schizophrenia or Schizoaffective disorder in past: No   Hallucinations:Auditory; Visual "conservation from upstairs apartment" "Bugs flying and crwling of me"  Ideas of Reference:Paranoia  Suicidal Thoughts:No With Plan  Homicidal Thoughts:No   Sensorium  Memory: Immediate Good; Recent Good; Remote Good  Judgment: Fair  Insight: Present   Executive Functions  Concentration: Fair  Attention Span: Fair  Recall: Good  Fund of Knowledge: Good  Language: Good   Psychomotor Activity  Psychomotor Activity: Normal   Assets  Assets: Armed forces logistics/support/administrative officer; Desire for Improvement; Housing; Social Support;  Transportation   Sleep  Sleep: Poor  Number of hours:  5   No data recorded  Physical Exam: Physical Exam Vitals and nursing note reviewed.  Cardiovascular:     Rate and Rhythm: Normal rate and regular rhythm.  Abdominal:     General: Abdomen is flat.     Palpations: Abdomen is soft.  Psychiatric:        Mood and Affect: Mood normal.        Thought Content: Thought content normal.    Review of Systems  Eyes: Negative.   Cardiovascular: Negative.   Musculoskeletal: Negative.   Endo/Heme/Allergies: Negative.   Psychiatric/Behavioral:  Positive for depression and hallucinations. The patient is nervous/anxious.   All other systems reviewed and are negative.  There were no vitals taken for this visit. There is no height or weight on file to calculate BMI.  Musculoskeletal: Strength & Muscle Tone: decreased Gait & Station: unsteady Patient leans: N/A Patient observed walking with cane unsteady gait  [facility] Discharge Disposition for Follow up and Recommendations: Based on my evaluation the patient does not appear to have an emergency medical condition and can be discharged with resources and follow up care in outpatient services for Medication Management and Individual Therapy   Derrill [place], NP 05/04/2022, 1:46 PM

## 2022-05-04 NOTE — Progress Notes (Signed)
Patient is a 56 year old male that presents this date as a voluntary walk in to [facility] requesting assistance with current AH (patient denies any command AH and is vague in reference to content) patient also reports ongoing tactical hallucinations stating he "feels bugs crawling on him." Patient is denying any S/I, H/I or VH. Patient receives OP services from [facility] in [place] who assist with medication management for mental health symptoms associated with Bipolar I per notes. Patient is also prescribed opiates for chronic pain management issues. Patient is currently contracting for safety although per notes was seen on 05/03/22 (See Gwenlyn Found NP note of that date) at [facility] when he presented with similar symptoms and was recommended for an inpatient admission although asked to be discharged once he became upset over the providers not being able to assist with his correct pain management dosage. Patient was denying any S/I, H/I or VH at that time also. Per notes there was no indication he was responding to internal stimuli at that time. Patient this date after being evaluated by NP Lewis was offered an inpatient admission although again patient declined once he was informed that his opiate medications could not be continued here. Patient contracts for safety and states he will follow up with his current mental health provider for ongoing assistance. Patient was discharged without incident not meeting criteria for an IVC.

## 2022-05-05 NOTE — ED Notes (Signed)
Pt left AMA. Discussed with Dr. Roderic Palau prior to pt departure and he confirmed that pt was not considered a danger to himself or others and therefore was voluntary, although he had been recommended for inpatient psychiatric treatment due to reported complaints. Pt and family were informed of this and stated that they would leave and go to [facility] for evaluation. Dr. Roderic Palau aware and in agreement of plan. Pt ambulatory to exit.

## 2022-05-17 ENCOUNTER — Other Ambulatory Visit (HOSPITAL_COMMUNITY): Payer: Self-pay

## 2022-05-30 ENCOUNTER — Other Ambulatory Visit (HOSPITAL_BASED_OUTPATIENT_CLINIC_OR_DEPARTMENT_OTHER): Payer: Self-pay

## 2022-05-30 ENCOUNTER — Other Ambulatory Visit (HOSPITAL_COMMUNITY): Payer: Self-pay

## 2022-05-31 ENCOUNTER — Other Ambulatory Visit (HOSPITAL_COMMUNITY): Payer: Self-pay

## 2022-06-03 ENCOUNTER — Other Ambulatory Visit (HOSPITAL_COMMUNITY): Payer: Self-pay

## 2022-06-03 MED ORDER — PROPRANOLOL HCL ER 120 MG PO CP24
120.0000 mg | ORAL_CAPSULE | Freq: Every day | ORAL | 3 refills | Status: DC
  Filled 2022-06-03: qty 90, 90d supply, fill #0
  Filled 2022-10-21: qty 90, 90d supply, fill #1

## 2022-06-03 MED ORDER — OXYCODONE HCL 20 MG PO TABS
1.0000 | ORAL_TABLET | Freq: Four times a day (QID) | ORAL | 0 refills | Status: DC
  Filled 2022-06-03: qty 120, 30d supply, fill #0

## 2022-06-13 ENCOUNTER — Other Ambulatory Visit (HOSPITAL_COMMUNITY): Payer: Self-pay

## 2022-06-17 ENCOUNTER — Other Ambulatory Visit (HOSPITAL_COMMUNITY): Payer: Self-pay

## 2022-07-03 ENCOUNTER — Other Ambulatory Visit (HOSPITAL_COMMUNITY): Payer: Self-pay

## 2022-07-03 MED ORDER — OXYCODONE HCL 20 MG PO TABS
1.0000 | ORAL_TABLET | Freq: Four times a day (QID) | ORAL | 0 refills | Status: DC
  Filled 2022-07-03: qty 120, 30d supply, fill #0

## 2022-07-28 ENCOUNTER — Other Ambulatory Visit (HOSPITAL_COMMUNITY): Payer: Self-pay

## 2022-07-28 MED ORDER — OXYCODONE HCL 20 MG PO TABS
1.0000 | ORAL_TABLET | Freq: Four times a day (QID) | ORAL | 0 refills | Status: DC
  Filled 2022-07-28 – 2022-07-31 (×2): qty 120, 30d supply, fill #0

## 2022-07-30 ENCOUNTER — Other Ambulatory Visit (HOSPITAL_COMMUNITY): Payer: Self-pay

## 2022-07-31 ENCOUNTER — Other Ambulatory Visit (HOSPITAL_COMMUNITY): Payer: Self-pay

## 2022-08-26 ENCOUNTER — Other Ambulatory Visit (HOSPITAL_COMMUNITY): Payer: Self-pay

## 2022-08-26 MED ORDER — OXYCODONE HCL 20 MG PO TABS
1.0000 | ORAL_TABLET | Freq: Four times a day (QID) | ORAL | 0 refills | Status: DC
  Filled 2022-08-26 – 2022-08-28 (×3): qty 120, 30d supply, fill #0

## 2022-08-27 ENCOUNTER — Other Ambulatory Visit (HOSPITAL_COMMUNITY): Payer: Self-pay

## 2022-08-28 ENCOUNTER — Other Ambulatory Visit: Payer: Self-pay

## 2022-08-28 ENCOUNTER — Other Ambulatory Visit (HOSPITAL_COMMUNITY): Payer: Self-pay

## 2022-09-23 ENCOUNTER — Other Ambulatory Visit (HOSPITAL_COMMUNITY): Payer: Self-pay

## 2022-09-23 MED ORDER — OXYCODONE HCL 20 MG PO TABS
1.0000 | ORAL_TABLET | Freq: Four times a day (QID) | ORAL | 0 refills | Status: DC
  Filled 2022-09-23 – 2022-09-26 (×2): qty 120, 30d supply, fill #0

## 2022-09-26 ENCOUNTER — Other Ambulatory Visit (HOSPITAL_COMMUNITY): Payer: Self-pay

## 2022-10-09 ENCOUNTER — Other Ambulatory Visit (HOSPITAL_COMMUNITY): Payer: Self-pay

## 2022-10-09 MED ORDER — BUPRENORPHINE HCL-NALOXONE HCL 8-2 MG SL FILM
ORAL_FILM | SUBLINGUAL | 0 refills | Status: DC
  Filled 2022-10-09 (×2): qty 7, 7d supply, fill #0

## 2022-10-16 ENCOUNTER — Other Ambulatory Visit (HOSPITAL_COMMUNITY): Payer: Self-pay

## 2022-10-16 MED ORDER — BUPRENORPHINE HCL-NALOXONE HCL 8-2 MG SL FILM
ORAL_FILM | Freq: Every day | SUBLINGUAL | 0 refills | Status: DC
  Filled 2022-10-16: qty 11, 7d supply, fill #0

## 2022-10-21 ENCOUNTER — Other Ambulatory Visit (HOSPITAL_COMMUNITY): Payer: Self-pay

## 2022-10-21 MED ORDER — BUPRENORPHINE HCL-NALOXONE HCL 8-2 MG SL FILM
ORAL_FILM | SUBLINGUAL | 0 refills | Status: DC
  Filled 2022-10-21: qty 3, 2d supply, fill #0

## 2022-10-22 ENCOUNTER — Other Ambulatory Visit (HOSPITAL_COMMUNITY): Payer: Self-pay

## 2022-10-23 ENCOUNTER — Other Ambulatory Visit (HOSPITAL_COMMUNITY): Payer: Self-pay

## 2022-10-24 ENCOUNTER — Other Ambulatory Visit (HOSPITAL_COMMUNITY): Payer: Self-pay

## 2022-10-24 MED ORDER — BUPRENORPHINE HCL-NALOXONE HCL 4-1 MG SL FILM
ORAL_FILM | SUBLINGUAL | 0 refills | Status: DC
  Filled 2022-10-24: qty 15, 5d supply, fill #0

## 2022-10-28 ENCOUNTER — Other Ambulatory Visit (HOSPITAL_COMMUNITY): Payer: Self-pay

## 2022-10-29 ENCOUNTER — Other Ambulatory Visit (HOSPITAL_COMMUNITY): Payer: Self-pay

## 2022-10-30 ENCOUNTER — Other Ambulatory Visit (HOSPITAL_COMMUNITY): Payer: Self-pay

## 2022-10-30 MED ORDER — BUPRENORPHINE HCL-NALOXONE HCL 8-2 MG SL FILM
ORAL_FILM | SUBLINGUAL | 0 refills | Status: DC
  Filled 2022-10-30: qty 8, 4d supply, fill #0

## 2022-11-10 ENCOUNTER — Other Ambulatory Visit (HOSPITAL_COMMUNITY): Payer: Self-pay

## 2022-11-11 ENCOUNTER — Other Ambulatory Visit (HOSPITAL_COMMUNITY): Payer: Self-pay

## 2022-11-12 ENCOUNTER — Other Ambulatory Visit (HOSPITAL_COMMUNITY): Payer: Self-pay

## 2022-11-12 MED ORDER — OXYCODONE HCL 20 MG PO TABS
20.0000 mg | ORAL_TABLET | Freq: Three times a day (TID) | ORAL | 0 refills | Status: DC
  Filled 2022-11-12: qty 90, 30d supply, fill #0

## 2022-11-13 ENCOUNTER — Other Ambulatory Visit (HOSPITAL_COMMUNITY): Payer: Self-pay

## 2022-11-17 ENCOUNTER — Other Ambulatory Visit (HOSPITAL_COMMUNITY): Payer: Self-pay

## 2022-11-19 DIAGNOSIS — R000 Tachycardia, unspecified: Secondary | ICD-10-CM

## 2022-11-19 DIAGNOSIS — I4430 Unspecified atrioventricular block: Secondary | ICD-10-CM

## 2022-11-20 ENCOUNTER — Other Ambulatory Visit (HOSPITAL_COMMUNITY): Payer: Self-pay

## 2022-11-20 DIAGNOSIS — I4430 Unspecified atrioventricular block: Secondary | ICD-10-CM | POA: Diagnosis not present

## 2022-11-21 ENCOUNTER — Other Ambulatory Visit (HOSPITAL_COMMUNITY): Payer: Self-pay

## 2022-11-21 DIAGNOSIS — I340 Nonrheumatic mitral (valve) insufficiency: Secondary | ICD-10-CM | POA: Diagnosis not present

## 2022-11-21 DIAGNOSIS — I4430 Unspecified atrioventricular block: Secondary | ICD-10-CM | POA: Diagnosis not present

## 2022-11-21 DIAGNOSIS — I361 Nonrheumatic tricuspid (valve) insufficiency: Secondary | ICD-10-CM

## 2022-11-24 ENCOUNTER — Other Ambulatory Visit (HOSPITAL_COMMUNITY): Payer: Self-pay

## 2022-11-25 ENCOUNTER — Other Ambulatory Visit (HOSPITAL_COMMUNITY): Payer: Self-pay

## 2022-11-28 DIAGNOSIS — G928 Other toxic encephalopathy: Secondary | ICD-10-CM | POA: Diagnosis not present

## 2022-12-01 ENCOUNTER — Other Ambulatory Visit (HOSPITAL_COMMUNITY): Payer: Self-pay

## 2022-12-05 DIAGNOSIS — E860 Dehydration: Secondary | ICD-10-CM | POA: Diagnosis not present

## 2022-12-10 ENCOUNTER — Encounter: Payer: Self-pay | Admitting: Specialist

## 2022-12-11 ENCOUNTER — Other Ambulatory Visit (HOSPITAL_COMMUNITY): Payer: Self-pay

## 2022-12-11 ENCOUNTER — Encounter: Payer: Self-pay | Admitting: Internal Medicine

## 2022-12-11 MED ORDER — VITAMIN D (ERGOCALCIFEROL) 1.25 MG (50000 UNIT) PO CAPS
50000.0000 [IU] | ORAL_CAPSULE | ORAL | 0 refills | Status: DC
  Filled 2022-12-11 – 2023-08-26 (×2): qty 10, 70d supply, fill #0

## 2022-12-11 MED ORDER — QUETIAPINE FUMARATE 150 MG PO TABS
1.0000 | ORAL_TABLET | Freq: Every evening | ORAL | 0 refills | Status: DC
  Filled 2022-12-11: qty 30, 30d supply, fill #0

## 2022-12-11 MED ORDER — ESOMEPRAZOLE MAGNESIUM 40 MG PO CPDR
40.0000 mg | DELAYED_RELEASE_CAPSULE | Freq: Every day | ORAL | 0 refills | Status: DC
  Filled 2022-12-11: qty 30, 30d supply, fill #0

## 2022-12-11 MED ORDER — DICYCLOMINE HCL 10 MG PO CAPS
10.0000 mg | ORAL_CAPSULE | Freq: Three times a day (TID) | ORAL | 0 refills | Status: DC
  Filled 2022-12-11: qty 90, 30d supply, fill #0

## 2022-12-11 MED ORDER — DULOXETINE HCL 40 MG PO CPEP
40.0000 mg | ORAL_CAPSULE | Freq: Every day | ORAL | 0 refills | Status: DC
  Filled 2022-12-11 – 2023-05-17 (×2): qty 30, 30d supply, fill #0

## 2022-12-11 MED ORDER — OXYCODONE HCL 20 MG PO TABS
20.0000 mg | ORAL_TABLET | Freq: Three times a day (TID) | ORAL | 0 refills | Status: DC | PRN
  Filled 2022-12-11: qty 45, 15d supply, fill #0

## 2022-12-11 MED ORDER — FAMOTIDINE 20 MG PO TABS
20.0000 mg | ORAL_TABLET | Freq: Two times a day (BID) | ORAL | 0 refills | Status: DC
  Filled 2022-12-11: qty 60, 30d supply, fill #0

## 2022-12-11 MED ORDER — AMANTADINE HCL 100 MG PO CAPS
100.0000 mg | ORAL_CAPSULE | Freq: Two times a day (BID) | ORAL | 0 refills | Status: DC
  Filled 2022-12-11 – 2023-05-17 (×2): qty 60, 30d supply, fill #0

## 2022-12-11 MED ORDER — QUETIAPINE FUMARATE ER 400 MG PO TB24
400.0000 mg | ORAL_TABLET | Freq: Every evening | ORAL | 0 refills | Status: DC
  Filled 2022-12-11 – 2022-12-13 (×2): qty 30, 30d supply, fill #0

## 2022-12-11 MED ORDER — CERTAVITE/ANTIOXIDANTS PO TABS
1.0000 | ORAL_TABLET | Freq: Every day | ORAL | 0 refills | Status: AC
  Filled 2022-12-11: qty 30, 30d supply, fill #0

## 2022-12-11 MED ORDER — METHOCARBAMOL 750 MG PO TABS
750.0000 mg | ORAL_TABLET | Freq: Four times a day (QID) | ORAL | 0 refills | Status: DC
  Filled 2022-12-11 – 2023-04-15 (×2): qty 60, 15d supply, fill #0

## 2022-12-11 MED ORDER — OLANZAPINE 10 MG PO TABS
10.0000 mg | ORAL_TABLET | Freq: Every evening | ORAL | 0 refills | Status: DC
  Filled 2022-12-11: qty 30, 30d supply, fill #0

## 2022-12-11 MED ORDER — TRAZODONE HCL 50 MG PO TABS
50.0000 mg | ORAL_TABLET | Freq: Every evening | ORAL | 0 refills | Status: DC
  Filled 2022-12-11: qty 30, 30d supply, fill #0

## 2022-12-11 MED ORDER — POLYETHYLENE GLYCOL 3350 17 GM/SCOOP PO POWD
ORAL | 0 refills | Status: DC
  Filled 2022-12-11: qty 476, 28d supply, fill #0

## 2022-12-11 MED ORDER — NICOTINE 14 MG/24HR TD PT24
14.0000 mg | MEDICATED_PATCH | TRANSDERMAL | 0 refills | Status: DC
  Filled 2022-12-11: qty 28, 28d supply, fill #0

## 2022-12-11 MED ORDER — BUSPIRONE HCL 10 MG PO TABS
10.0000 mg | ORAL_TABLET | Freq: Three times a day (TID) | ORAL | 0 refills | Status: DC
  Filled 2022-12-11 – 2023-05-17 (×2): qty 90, 30d supply, fill #0

## 2022-12-11 MED ORDER — LEVOTHYROXINE SODIUM 50 MCG PO TABS
50.0000 ug | ORAL_TABLET | Freq: Every morning | ORAL | 0 refills | Status: DC
  Filled 2022-12-11 – 2023-04-15 (×2): qty 30, 30d supply, fill #0

## 2022-12-12 ENCOUNTER — Other Ambulatory Visit (HOSPITAL_COMMUNITY): Payer: Self-pay

## 2022-12-13 ENCOUNTER — Other Ambulatory Visit (HOSPITAL_COMMUNITY): Payer: Self-pay

## 2022-12-15 ENCOUNTER — Other Ambulatory Visit (HOSPITAL_COMMUNITY): Payer: Self-pay

## 2022-12-15 MED ORDER — OXYCODONE HCL 20 MG PO TABS
ORAL_TABLET | ORAL | 0 refills | Status: DC
  Filled 2022-12-15 – 2022-12-24 (×2): qty 90, 30d supply, fill #0

## 2022-12-19 ENCOUNTER — Other Ambulatory Visit (HOSPITAL_COMMUNITY): Payer: Self-pay

## 2022-12-24 ENCOUNTER — Other Ambulatory Visit (HOSPITAL_COMMUNITY): Payer: Self-pay

## 2023-01-06 ENCOUNTER — Other Ambulatory Visit: Payer: Self-pay | Admitting: Neurosurgery

## 2023-01-06 DIAGNOSIS — T85192A Other mechanical complication of implanted electronic neurostimulator (electrode) of spinal cord, initial encounter: Secondary | ICD-10-CM

## 2023-01-20 ENCOUNTER — Other Ambulatory Visit (HOSPITAL_COMMUNITY): Payer: Self-pay

## 2023-01-20 ENCOUNTER — Encounter: Payer: Self-pay | Admitting: Neurosurgery

## 2023-01-20 MED ORDER — OXYCODONE HCL 15 MG PO TABS
ORAL_TABLET | ORAL | 0 refills | Status: DC
Start: 2023-01-20 — End: 2023-04-07
  Filled 2023-01-20 (×2): qty 120, 30d supply, fill #0

## 2023-01-23 ENCOUNTER — Other Ambulatory Visit: Payer: MEDICAID

## 2023-01-29 ENCOUNTER — Other Ambulatory Visit (HOSPITAL_COMMUNITY): Payer: Self-pay

## 2023-01-29 MED ORDER — ESOMEPRAZOLE MAGNESIUM 40 MG PO CPDR
40.0000 mg | DELAYED_RELEASE_CAPSULE | Freq: Every day | ORAL | 1 refills | Status: DC
  Filled 2023-01-29 – 2023-07-06 (×3): qty 90, 90d supply, fill #0

## 2023-01-29 MED ORDER — ONDANSETRON HCL 4 MG PO TABS
4.0000 mg | ORAL_TABLET | Freq: Three times a day (TID) | ORAL | 5 refills | Status: DC | PRN
  Filled 2023-01-29 – 2023-04-18 (×2): qty 90, 30d supply, fill #0
  Filled 2023-05-25: qty 90, 30d supply, fill #1
  Filled 2023-08-26: qty 90, 30d supply, fill #2

## 2023-01-29 MED ORDER — FAMOTIDINE 20 MG PO TABS
20.0000 mg | ORAL_TABLET | Freq: Two times a day (BID) | ORAL | 1 refills | Status: DC
  Filled 2023-01-29 – 2023-04-15 (×2): qty 180, 90d supply, fill #0
  Filled 2023-07-05: qty 180, 90d supply, fill #1

## 2023-02-09 ENCOUNTER — Other Ambulatory Visit (HOSPITAL_COMMUNITY): Payer: Self-pay

## 2023-03-02 ENCOUNTER — Other Ambulatory Visit (HOSPITAL_COMMUNITY): Payer: Self-pay

## 2023-03-02 MED ORDER — OXYCODONE-ACETAMINOPHEN 10-325 MG PO TABS
1.0000 | ORAL_TABLET | ORAL | 0 refills | Status: DC | PRN
  Filled 2023-03-02: qty 10, 2d supply, fill #0

## 2023-03-27 ENCOUNTER — Other Ambulatory Visit (HOSPITAL_COMMUNITY): Payer: Self-pay

## 2023-03-27 MED ORDER — DULOXETINE HCL 40 MG PO CPEP
40.0000 mg | ORAL_CAPSULE | Freq: Every day | ORAL | 0 refills | Status: DC
Start: 2023-03-27 — End: 2024-02-09
  Filled 2023-03-27 – 2023-04-15 (×2): qty 7, 7d supply, fill #0

## 2023-04-06 ENCOUNTER — Other Ambulatory Visit (HOSPITAL_COMMUNITY): Payer: Self-pay

## 2023-04-07 ENCOUNTER — Other Ambulatory Visit (HOSPITAL_COMMUNITY): Payer: Self-pay

## 2023-04-07 MED ORDER — OXYCODONE HCL 15 MG PO TABS
15.0000 mg | ORAL_TABLET | Freq: Four times a day (QID) | ORAL | 0 refills | Status: DC
  Filled 2023-04-07: qty 28, 7d supply, fill #0

## 2023-04-13 ENCOUNTER — Other Ambulatory Visit (HOSPITAL_COMMUNITY): Payer: Self-pay

## 2023-04-13 ENCOUNTER — Other Ambulatory Visit: Payer: Self-pay

## 2023-04-13 MED ORDER — ESOMEPRAZOLE MAGNESIUM 40 MG PO CPDR
40.0000 mg | DELAYED_RELEASE_CAPSULE | Freq: Every day | ORAL | 1 refills | Status: DC
  Filled 2023-04-13: qty 90, 90d supply, fill #0
  Filled 2023-07-05: qty 90, 90d supply, fill #1

## 2023-04-13 MED ORDER — DICYCLOMINE HCL 10 MG PO CAPS
10.0000 mg | ORAL_CAPSULE | Freq: Three times a day (TID) | ORAL | 1 refills | Status: DC | PRN
Start: 2023-04-13 — End: 2023-09-15
  Filled 2023-04-13: qty 90, 30d supply, fill #0
  Filled 2023-05-17: qty 90, 30d supply, fill #1
  Filled 2023-06-22: qty 90, 30d supply, fill #2
  Filled 2023-08-04 – 2023-08-07 (×2): qty 90, 30d supply, fill #3
  Filled 2023-09-03: qty 90, 30d supply, fill #4

## 2023-04-14 ENCOUNTER — Other Ambulatory Visit (HOSPITAL_COMMUNITY): Payer: Self-pay

## 2023-04-14 MED ORDER — NALOXONE HCL 4 MG/0.1ML NA LIQD: NASAL | 3 refills | Status: DC

## 2023-04-14 MED ORDER — OXYCODONE HCL 10 MG PO TABS
10.0000 mg | ORAL_TABLET | Freq: Four times a day (QID) | ORAL | 0 refills | Status: DC
  Filled 2023-04-14: qty 56, 14d supply, fill #0

## 2023-04-14 MED ORDER — ERGOCALCIFEROL 1.25 MG (50000 UT) PO CAPS
50000.0000 [IU] | ORAL_CAPSULE | ORAL | 6 refills | Status: DC
  Filled 2023-04-14: qty 4, 28d supply, fill #0
  Filled 2023-05-17: qty 4, 28d supply, fill #1
  Filled 2023-06-22: qty 4, 28d supply, fill #2
  Filled 2023-07-06 – 2023-07-18 (×2): qty 4, 28d supply, fill #3

## 2023-04-15 ENCOUNTER — Other Ambulatory Visit (HOSPITAL_COMMUNITY): Payer: Self-pay

## 2023-04-16 ENCOUNTER — Other Ambulatory Visit: Payer: Self-pay

## 2023-04-16 ENCOUNTER — Other Ambulatory Visit (HOSPITAL_COMMUNITY): Payer: Self-pay

## 2023-04-18 ENCOUNTER — Other Ambulatory Visit (HOSPITAL_COMMUNITY): Payer: Self-pay

## 2023-04-20 ENCOUNTER — Other Ambulatory Visit (HOSPITAL_COMMUNITY): Payer: Self-pay

## 2023-04-28 ENCOUNTER — Other Ambulatory Visit: Payer: Self-pay

## 2023-04-28 ENCOUNTER — Other Ambulatory Visit (HOSPITAL_COMMUNITY): Payer: Self-pay

## 2023-04-28 MED ORDER — NALOXONE HCL 4 MG/0.1ML NA LIQD
1.0000 | NASAL | 3 refills | Status: DC
  Filled 2023-04-28: qty 2, 2d supply, fill #0

## 2023-04-28 MED ORDER — OXYCODONE HCL 10 MG PO TABS
10.0000 mg | ORAL_TABLET | Freq: Four times a day (QID) | ORAL | 0 refills | Status: DC
  Filled 2023-04-28: qty 120, 30d supply, fill #0

## 2023-04-28 MED ORDER — METHOCARBAMOL 750 MG PO TABS
750.0000 mg | ORAL_TABLET | Freq: Four times a day (QID) | ORAL | 6 refills | Status: DC | PRN
  Filled 2023-04-28: qty 120, 30d supply, fill #0
  Filled 2023-05-25: qty 120, 30d supply, fill #1
  Filled 2023-07-05: qty 120, 30d supply, fill #2
  Filled 2023-08-04 – 2023-08-07 (×2): qty 120, 30d supply, fill #3
  Filled 2023-08-26 – 2023-08-29 (×2): qty 120, 30d supply, fill #4

## 2023-04-29 ENCOUNTER — Other Ambulatory Visit (HOSPITAL_COMMUNITY): Payer: Self-pay

## 2023-05-17 ENCOUNTER — Other Ambulatory Visit (HOSPITAL_COMMUNITY): Payer: Self-pay

## 2023-05-18 ENCOUNTER — Other Ambulatory Visit (HOSPITAL_COMMUNITY): Payer: Self-pay

## 2023-05-18 ENCOUNTER — Other Ambulatory Visit: Payer: Self-pay

## 2023-05-20 ENCOUNTER — Other Ambulatory Visit (HOSPITAL_COMMUNITY): Payer: Self-pay

## 2023-05-26 ENCOUNTER — Other Ambulatory Visit: Payer: Self-pay

## 2023-05-26 ENCOUNTER — Other Ambulatory Visit (HOSPITAL_COMMUNITY): Payer: Self-pay

## 2023-05-28 ENCOUNTER — Other Ambulatory Visit: Payer: Self-pay

## 2023-05-28 ENCOUNTER — Other Ambulatory Visit (HOSPITAL_COMMUNITY): Payer: Self-pay

## 2023-05-28 MED ORDER — OXYCODONE HCL 10 MG PO TABS
10.0000 mg | ORAL_TABLET | Freq: Four times a day (QID) | ORAL | 0 refills | Status: DC
  Filled 2023-05-28: qty 28, 7d supply, fill #0

## 2023-05-28 MED ORDER — METHOCARBAMOL 750 MG PO TABS
750.0000 mg | ORAL_TABLET | Freq: Four times a day (QID) | ORAL | 0 refills | Status: DC | PRN
  Filled 2023-05-28: qty 28, 7d supply, fill #0

## 2023-05-29 ENCOUNTER — Other Ambulatory Visit (HOSPITAL_COMMUNITY): Payer: Self-pay

## 2023-06-07 ENCOUNTER — Other Ambulatory Visit (HOSPITAL_COMMUNITY): Payer: Self-pay

## 2023-06-07 MED ORDER — REPATHA SURECLICK 140 MG/ML ~~LOC~~ SOAJ
140.0000 mg | SUBCUTANEOUS | 12 refills | Status: DC
  Filled 2023-06-07: qty 2, 28d supply, fill #0
  Filled 2023-07-05: qty 2, 28d supply, fill #1
  Filled ????-??-??: fill #1

## 2023-06-07 MED ORDER — OXYCODONE HCL 10 MG PO TABS
10.0000 mg | ORAL_TABLET | Freq: Four times a day (QID) | ORAL | 0 refills | Status: DC
  Filled 2023-06-07: qty 28, 7d supply, fill #0

## 2023-06-07 MED ORDER — LEVOTHYROXINE SODIUM 50 MCG PO TABS
50.0000 ug | ORAL_TABLET | Freq: Every day | ORAL | 0 refills | Status: DC
  Filled 2023-06-07: qty 90, 90d supply, fill #0

## 2023-06-08 ENCOUNTER — Other Ambulatory Visit (HOSPITAL_COMMUNITY): Payer: Self-pay

## 2023-06-12 ENCOUNTER — Other Ambulatory Visit (HOSPITAL_COMMUNITY): Payer: Self-pay

## 2023-06-16 ENCOUNTER — Other Ambulatory Visit (HOSPITAL_COMMUNITY): Payer: Self-pay

## 2023-06-16 MED ORDER — OXYCODONE HCL 10 MG PO TABS
10.0000 mg | ORAL_TABLET | Freq: Four times a day (QID) | ORAL | 0 refills | Status: DC
  Filled 2023-06-16: qty 56, 14d supply, fill #0

## 2023-06-30 ENCOUNTER — Other Ambulatory Visit (HOSPITAL_COMMUNITY): Payer: Self-pay

## 2023-06-30 MED ORDER — ASPIRIN 325 MG PO TABS
325.0000 mg | ORAL_TABLET | Freq: Every day | ORAL | 4 refills | Status: DC
  Filled 2023-06-30: qty 90, 90d supply, fill #0

## 2023-06-30 MED ORDER — OXYCODONE HCL 10 MG PO TABS
10.0000 mg | ORAL_TABLET | Freq: Four times a day (QID) | ORAL | 0 refills | Status: DC
  Filled 2023-06-30: qty 84, 21d supply, fill #0

## 2023-07-01 ENCOUNTER — Other Ambulatory Visit (HOSPITAL_COMMUNITY): Payer: Self-pay

## 2023-07-05 ENCOUNTER — Other Ambulatory Visit (HOSPITAL_COMMUNITY): Payer: Self-pay

## 2023-07-07 ENCOUNTER — Other Ambulatory Visit (HOSPITAL_COMMUNITY): Payer: Self-pay

## 2023-07-10 ENCOUNTER — Other Ambulatory Visit: Payer: Self-pay

## 2023-07-10 ENCOUNTER — Other Ambulatory Visit (HOSPITAL_COMMUNITY): Payer: Self-pay

## 2023-07-20 ENCOUNTER — Other Ambulatory Visit (HOSPITAL_COMMUNITY): Payer: Self-pay

## 2023-07-20 MED ORDER — OXYCODONE HCL 10 MG PO TABS
10.0000 mg | ORAL_TABLET | Freq: Four times a day (QID) | ORAL | 0 refills | Status: DC
Start: 2023-07-20 — End: 2023-10-05
  Filled 2023-07-20: qty 120, 30d supply, fill #0

## 2023-07-22 ENCOUNTER — Other Ambulatory Visit (HOSPITAL_COMMUNITY): Payer: Self-pay

## 2023-07-22 ENCOUNTER — Other Ambulatory Visit: Payer: Self-pay

## 2023-08-06 ENCOUNTER — Other Ambulatory Visit (HOSPITAL_COMMUNITY): Payer: Self-pay

## 2023-08-07 ENCOUNTER — Other Ambulatory Visit (HOSPITAL_COMMUNITY): Payer: Self-pay

## 2023-08-07 MED ORDER — CYCLOBENZAPRINE HCL 10 MG PO TABS
ORAL_TABLET | ORAL | 0 refills | Status: DC
  Filled 2023-08-07: qty 60, 30d supply, fill #0

## 2023-08-07 MED ORDER — METHYLPREDNISOLONE 4 MG PO TBPK
ORAL_TABLET | ORAL | 0 refills | Status: DC
  Filled 2023-08-07: qty 21, 6d supply, fill #0

## 2023-08-08 ENCOUNTER — Other Ambulatory Visit (HOSPITAL_COMMUNITY): Payer: Self-pay

## 2023-08-11 ENCOUNTER — Other Ambulatory Visit (HOSPITAL_COMMUNITY): Payer: Self-pay

## 2023-08-17 ENCOUNTER — Other Ambulatory Visit (HOSPITAL_COMMUNITY): Payer: Self-pay

## 2023-08-17 MED ORDER — AMANTADINE HCL 100 MG PO TABS
100.0000 mg | ORAL_TABLET | Freq: Two times a day (BID) | ORAL | 0 refills | Status: DC
  Filled 2023-08-17: qty 180, 90d supply, fill #0
  Filled 2023-09-16: qty 60, 30d supply, fill #0

## 2023-08-21 ENCOUNTER — Other Ambulatory Visit (HOSPITAL_COMMUNITY): Payer: Self-pay

## 2023-08-26 ENCOUNTER — Other Ambulatory Visit (HOSPITAL_COMMUNITY): Payer: Self-pay

## 2023-08-27 ENCOUNTER — Other Ambulatory Visit: Payer: Self-pay

## 2023-08-27 ENCOUNTER — Other Ambulatory Visit (HOSPITAL_COMMUNITY): Payer: Self-pay

## 2023-08-30 ENCOUNTER — Other Ambulatory Visit: Payer: Self-pay

## 2023-09-02 ENCOUNTER — Other Ambulatory Visit (HOSPITAL_COMMUNITY): Payer: Self-pay

## 2023-09-02 MED ORDER — NALOXONE HCL 4 MG/0.1ML NA LIQD
NASAL | 0 refills | Status: DC
Start: 2023-09-01 — End: 2023-09-15
  Filled 2023-09-02: qty 4, 30d supply, fill #0

## 2023-09-03 ENCOUNTER — Other Ambulatory Visit (HOSPITAL_COMMUNITY): Payer: Self-pay

## 2023-09-03 ENCOUNTER — Other Ambulatory Visit: Payer: Self-pay

## 2023-09-04 ENCOUNTER — Other Ambulatory Visit (HOSPITAL_COMMUNITY): Payer: Self-pay

## 2023-09-07 ENCOUNTER — Other Ambulatory Visit (HOSPITAL_COMMUNITY): Payer: Self-pay

## 2023-09-08 ENCOUNTER — Other Ambulatory Visit (HOSPITAL_COMMUNITY): Payer: Self-pay

## 2023-09-09 ENCOUNTER — Other Ambulatory Visit (HOSPITAL_COMMUNITY): Payer: Self-pay

## 2023-09-10 ENCOUNTER — Other Ambulatory Visit (HOSPITAL_COMMUNITY): Payer: Self-pay

## 2023-09-11 ENCOUNTER — Other Ambulatory Visit (HOSPITAL_COMMUNITY): Payer: Self-pay

## 2023-09-15 ENCOUNTER — Encounter (HOSPITAL_BASED_OUTPATIENT_CLINIC_OR_DEPARTMENT_OTHER): Payer: Self-pay | Admitting: Student

## 2023-09-15 ENCOUNTER — Other Ambulatory Visit (HOSPITAL_BASED_OUTPATIENT_CLINIC_OR_DEPARTMENT_OTHER): Payer: Self-pay

## 2023-09-15 ENCOUNTER — Ambulatory Visit (HOSPITAL_BASED_OUTPATIENT_CLINIC_OR_DEPARTMENT_OTHER): Payer: MEDICAID | Admitting: Student

## 2023-09-15 VITALS — BP 178/109 | HR 84 | Temp 98.3°F | Resp 16 | Ht 71.06 in | Wt 214.3 lb

## 2023-09-15 DIAGNOSIS — Z131 Encounter for screening for diabetes mellitus: Secondary | ICD-10-CM

## 2023-09-15 DIAGNOSIS — Z7689 Persons encountering health services in other specified circumstances: Secondary | ICD-10-CM

## 2023-09-15 DIAGNOSIS — E785 Hyperlipidemia, unspecified: Secondary | ICD-10-CM | POA: Diagnosis not present

## 2023-09-15 DIAGNOSIS — K219 Gastro-esophageal reflux disease without esophagitis: Secondary | ICD-10-CM

## 2023-09-15 DIAGNOSIS — F3110 Bipolar disorder, current episode manic without psychotic features, unspecified: Secondary | ICD-10-CM

## 2023-09-15 DIAGNOSIS — Z5986 Financial insecurity: Secondary | ICD-10-CM

## 2023-09-15 DIAGNOSIS — M5489 Other dorsalgia: Secondary | ICD-10-CM

## 2023-09-15 DIAGNOSIS — I10 Essential (primary) hypertension: Secondary | ICD-10-CM

## 2023-09-15 DIAGNOSIS — R251 Tremor, unspecified: Secondary | ICD-10-CM

## 2023-09-15 DIAGNOSIS — K580 Irritable bowel syndrome with diarrhea: Secondary | ICD-10-CM

## 2023-09-15 DIAGNOSIS — Z59869 Financial insecurity, unspecified: Secondary | ICD-10-CM

## 2023-09-15 DIAGNOSIS — G8929 Other chronic pain: Secondary | ICD-10-CM

## 2023-09-15 DIAGNOSIS — E039 Hypothyroidism, unspecified: Secondary | ICD-10-CM | POA: Diagnosis not present

## 2023-09-15 HISTORY — DX: Tremor, unspecified: R25.1

## 2023-09-15 MED ORDER — FAMOTIDINE 20 MG PO TABS
20.0000 mg | ORAL_TABLET | Freq: Two times a day (BID) | ORAL | 1 refills | Status: DC
  Filled 2023-09-15: qty 90, 45d supply, fill #0

## 2023-09-15 MED ORDER — REPATHA SURECLICK 140 MG/ML ~~LOC~~ SOAJ
140.0000 mg | SUBCUTANEOUS | 12 refills | Status: AC
  Filled 2023-09-15 – 2023-10-30 (×2): qty 2, 28d supply, fill #0
  Filled 2023-11-16 – 2023-11-20 (×2): qty 2, 28d supply, fill #1
  Filled 2023-12-20: qty 2, 28d supply, fill #2
  Filled 2024-01-21: qty 2, 28d supply, fill #3
  Filled 2024-02-15: qty 2, 28d supply, fill #4
  Filled 2024-03-16: qty 2, 28d supply, fill #5
  Filled 2024-04-10: qty 2, 28d supply, fill #6
  Filled 2024-05-08: qty 2, 28d supply, fill #7
  Filled 2024-06-05: qty 2, 28d supply, fill #8
  Filled 2024-07-03: qty 2, 28d supply, fill #9

## 2023-09-15 MED ORDER — CHLORTHALIDONE 25 MG PO TABS
25.0000 mg | ORAL_TABLET | Freq: Every day | ORAL | 5 refills | Status: DC
  Filled 2023-09-15: qty 30, 30d supply, fill #0
  Filled 2023-10-12: qty 30, 30d supply, fill #1
  Filled 2023-11-16: qty 30, 30d supply, fill #2
  Filled 2023-12-20: qty 30, 30d supply, fill #3

## 2023-09-15 MED ORDER — DICLOFENAC SODIUM 1 % EX GEL
CUTANEOUS | 5 refills | Status: AC
  Filled 2023-09-15: qty 300, 18d supply, fill #0
  Filled 2024-04-16: qty 300, 18d supply, fill #1
  Filled 2024-04-18: qty 200, 15d supply, fill #1
  Filled 2024-04-18: qty 100, 15d supply, fill #1

## 2023-09-15 MED ORDER — METHOCARBAMOL 750 MG PO TABS
750.0000 mg | ORAL_TABLET | Freq: Four times a day (QID) | ORAL | 6 refills | Status: DC | PRN
  Filled 2023-09-15 – 2023-10-03 (×3): qty 120, 30d supply, fill #0
  Filled 2023-11-01: qty 120, 30d supply, fill #1
  Filled 2023-12-06: qty 120, 30d supply, fill #2
  Filled 2024-01-03: qty 120, 30d supply, fill #3
  Filled 2024-01-31: qty 120, 30d supply, fill #4
  Filled 2024-02-27: qty 120, 30d supply, fill #5
  Filled 2024-04-04: qty 80, 20d supply, fill #6
  Filled 2024-04-04: qty 40, 10d supply, fill #6

## 2023-09-15 MED ORDER — ONDANSETRON HCL 4 MG PO TABS
4.0000 mg | ORAL_TABLET | Freq: Four times a day (QID) | ORAL | 0 refills | Status: DC | PRN
Start: 2023-09-15 — End: 2023-10-27
  Filled 2023-09-15 (×2): qty 120, 30d supply, fill #0

## 2023-09-15 MED ORDER — ESOMEPRAZOLE MAGNESIUM 40 MG PO CPDR
40.0000 mg | DELAYED_RELEASE_CAPSULE | Freq: Every day | ORAL | 1 refills | Status: DC
  Filled 2023-09-15: qty 90, 90d supply, fill #0

## 2023-09-15 MED ORDER — LEVOTHYROXINE SODIUM 50 MCG PO TABS
50.0000 ug | ORAL_TABLET | Freq: Every day | ORAL | 2 refills | Status: DC
  Filled 2023-09-15: qty 90, 90d supply, fill #0

## 2023-09-15 MED ORDER — DICYCLOMINE HCL 10 MG PO CAPS
10.0000 mg | ORAL_CAPSULE | Freq: Three times a day (TID) | ORAL | 2 refills | Status: DC | PRN
  Filled 2023-09-15 – 2023-10-03 (×3): qty 90, 30d supply, fill #0

## 2023-09-15 NOTE — Progress Notes (Signed)
 New Patient Office Visit  Subjective    Patient ID: [name], male    DOB: 08/19/1965  Age: 58 y.o. MRN: [id number]  CC:  Chief Complaint  Patient presents with   Establish Care    Here to establish care. Needed refills. Getting ready to get prostate surgery.    Discussed the use of AI scribe software for clinical note transcription with the patient, who gave verbal consent to proceed.  History of Present Illness   Travis SCHEXNAYDER is a 58 year old male who presents for medication management and specialist referrals.  [name] experiences tremors affecting his hands and legs, accompanied by occasional jerky movements. [name] has been taking amantadine for four years, initially prescribed by his primary care physician who has since passed away. [name] has not seen a neurologist for this issue. [name] was previously on propranolol, which provided some relief.  [name] has chronic pain due to multiple orthopedic issues, including two plates in his neck, vertebral misalignment, herniated discs, sacral problems, and both hips replaced. [name] has been without pain medication for two and a half months due to issues with his current pain management provider. The pain significantly impacts his blood pressure and overall well-being.  [name] has a history of bipolar disorder with psychotic features, managed with multiple psychiatric medications including buspirone, hydroxyzine, trazodone, Seroquel, Zyprexa, and duloxetine. [name] reports stability in his mental health with no current suicidal ideation or panic attacks.  [name] has hypothyroidism and has been off levothyroxine for three weeks, which [name] usually takes in the morning. [name] reports a 15-pound weight gain, which [name] attributes to his thyroid issues. [name] has a family history of thyroid problems.  [name] has a history of irritable bowel syndrome with diarrhea, acid reflux, and Barrett's esophagus. [name] is on famotidine, Nexium, and dicyclomine for these conditions.  [name] has a  history of high cholesterol managed with Repatha, which has significantly lowered his cholesterol levels. [name] has a family history of cardiac issues.  [name] smokes half a pack of cigarettes a day and has done so for 40 years. [name] has not had a low-dose CT scan of his chest in a couple of years.  [name] reports intermittent chest pain and palpitations, with a history of a slightly leaky heart valve. [name] had a full heart CT with contrast about eight months ago.  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  [name] has a vitamin D deficiency managed with weekly supplementation. No chest pain, shortness of breath, or palpitations today. Occasional swelling in hands and ankles, which resolves throughout the day.     Record review of 07/15/21 with psych reveals that [name] was "tested for parkinson's in the past and was negative". See records as far back as 2006 indicative of tremors.  Screenings:  Colon Cancer: 2026 due.  Lung Cancer: smokes 1 ppd for 40 years. Has not had CT chest in several years.  Diabetes: no pmh HLD: indicated  The 10-year ASCVD risk score (Arnett DK, et al., 2019) is: 17.4%   Outpatient Encounter Medications as of 09/15/2023  Medication Sig   Amantadine HCl 100 MG tablet Take 1 tablet (100 mg total) by mouth 2 (two) times daily.   aspirin EC 81 MG tablet Take 81 mg by mouth daily. Swallow whole.   busPIRone (BUSPAR) 30 MG tablet Take 30 mg by mouth 3 (three) times daily.   chlorthalidone (HYGROTON) 25 MG tablet Take 1 tablet (25 mg total)  by mouth daily.   diclofenac Sodium (VOLTAREN) 1 % GEL Apply 2 - 4 grams to painful joints up to 4 times daily if needed.   dicyclomine (BENTYL) 10 MG capsule Take 1 capsule (10 mg total) by mouth every 8 (eight) hours as needed.   docusate sodium (COLACE) 100 MG capsule Take 100 mg by mouth 3 (three) times daily.   DULoxetine HCl 40 MG CPEP Take 1 capsule (40 mg total) by mouth daily.   ergocalciferol  (VITAMIN D2) 1.25 MG (50000 UT) capsule Take 1 capsule (50,000 Units total) by mouth once a week.   esomeprazole (NEXIUM) 40 MG capsule Take 1 capsule (40 mg total) by mouth daily.   Evolocumab (REPATHA SURECLICK) 140 MG/ML SOAJ Inject 140 mg into the skin every 14 (fourteen) days.   famotidine (PEPCID) 20 MG tablet Take 1 tablet (20 mg total) by mouth 2 (two) times daily.   hydrOXYzine (ATARAX) 25 MG tablet Take 1 tablet (25 mg total) by mouth 3 (three) times daily.   levothyroxine (SYNTHROID) 50 MCG tablet Take 1 tablet (50 mcg total) by mouth daily before breakfast.   METAMUCIL FIBER PO Take by mouth. 2 tsp daily   methocarbamol (ROBAXIN) 750 MG tablet Take 1 tablet (750 mg total) by mouth 4 (four) times daily as needed.   Multiple Vitamins-Minerals (CERTAVITE/ANTIOXIDANTS) TABS Take 1 tablet by mouth daily.   naloxone (NARCAN) nasal spray 4 mg/0.1 mL Use 1 spray in nose as directed   OLANZapine (ZYPREXA) 20 MG tablet Take 20 mg by mouth at bedtime.   ondansetron (ZOFRAN) 4 MG tablet Take 1 tablet (4 mg total) by mouth 4 (four) times daily as needed.   Oxycodone HCl 10 MG TABS Take 1 tablet (10 mg total) by mouth 4 (four) times daily.   QUEtiapine (SEROQUEL XR) 400 MG 24 hr tablet Take 1 tablet (400 mg total) by mouth at bedtime.   QUEtiapine Fumarate (SEROQUEL XR) 150 MG 24 hr tablet Take 1 tablet by mouth every evening without food or with a light meal.   tadalafil (CIALIS) 5 MG tablet Take 1 tablet by mouth daily.   traZODone (DESYREL) 100 MG tablet Take 100 mg by mouth at bedtime.   [DISCONTINUED] diclofenac Sodium (VOLTAREN) 1 % GEL Apply 2 - 4 grams to painful joints up to 4 times daily if needed (max daily dose: 32 Gram)   [DISCONTINUED] diclofenac Sodium (VOLTAREN) 1 % GEL Apply 2-4 g topically 4 (four) times daily as needed.   [DISCONTINUED] diclofenac Sodium (VOLTAREN) 1 % GEL Apply 2 - 4 grams topically to painful joints up to 4 times daily as needed. Max daily dose: 32 grams.    [DISCONTINUED] dicyclomine (BENTYL) 10 MG capsule Take 1 (one) capsule by mouth every eight hours as needed   [DISCONTINUED] dicyclomine (BENTYL) 10 MG capsule Take 1 capsule (10 mg total) by mouth 3 (three) times daily.   [DISCONTINUED] dicyclomine (BENTYL) 10 MG capsule Take 1 capsule (10 mg total) by mouth every 8 (eight) hours as needed.   [DISCONTINUED] DULoxetine (CYMBALTA) 60 MG capsule Take 1 capsule (60 mg total) by mouth daily.   [DISCONTINUED] ergocalciferol (VITAMIN D2) 1.25 MG (50000 UT) capsule Take 1 capsule (50,000 Units total) by mouth once a week.   [DISCONTINUED] esomeprazole (NEXIUM) 40 MG capsule Take 1 capsule (40 mg total) by mouth daily.   [DISCONTINUED] Evolocumab (REPATHA SURECLICK) 140 MG/ML SOAJ Inject 140 mg into the skin twice a month.   [DISCONTINUED] famotidine (PEPCID) 20 MG tablet  Take 1 (one) tablet by mouth two times daily   [DISCONTINUED] famotidine (PEPCID) 20 MG tablet Take 1 tablet (20 mg total) by mouth 2 (two) times daily.   [DISCONTINUED] levothyroxine (SYNTHROID) 50 MCG tablet Take 1 tablet (50 mcg total) by mouth daily.   [DISCONTINUED] levothyroxine (SYNTHROID) 50 MCG tablet Take 1 tablet (50 mcg total) by mouth every morning.   [DISCONTINUED] levothyroxine (SYNTHROID) 50 MCG tablet Take 1 tablet (50 mcg total) by mouth daily.   [DISCONTINUED] methocarbamol (ROBAXIN) 750 MG tablet Take 1 tablet (750 mg total) by mouth 4 (four) times daily as needed.   [DISCONTINUED] methocarbamol (ROBAXIN) 750 MG tablet Take 1 tablet (750 mg total) by mouth 4 (four) times daily as needed.   [DISCONTINUED] ondansetron (ZOFRAN) 4 MG tablet Take 1  tablet by mouth four times daily as needed   [DISCONTINUED] ondansetron (ZOFRAN) 4 MG tablet Take 1 tablet (4 mg total) by mouth 4 (four) times daily as needed.   [DISCONTINUED] ondansetron (ZOFRAN) 4 MG tablet Take 1 tablet (4 mg total) by mouth every 8 (eight) hours as needed.   [DISCONTINUED] amantadine (SYMMETREL) 100 MG  capsule Take 1 capsule (100 mg total) by mouth 2 (two) times daily. (Patient not taking: Reported on 09/15/2023)   [DISCONTINUED] amantadine (SYMMETREL) 100 MG capsule Take1 capsule by mouth two times daily (Patient not taking: Reported on 09/15/2023)   [DISCONTINUED] amantadine (SYMMETREL) 100 MG capsule Take 2 capsules (200 mg total) by mouth daily. (Patient not taking: Reported on 09/15/2023)   [DISCONTINUED] amantadine (SYMMETREL) 100 MG capsule Take 1 capsule (100 mg total) by mouth 2 (two) times daily. (Patient not taking: Reported on 09/15/2023)   [DISCONTINUED] aspirin (MEDIQUE ASPIRIN) 325 MG tablet 1 tab by mouth daily (Patient not taking: Reported on 09/15/2023)   [DISCONTINUED] Buprenorphine HCl-Naloxone HCl 4-1 MG FILM Place 1 film on the tongue 3 times every day --allow to dissolve slowly in mouth without chewing or swallowing. No early refills. (Patient not taking: Reported on 09/15/2023)   [DISCONTINUED] Buprenorphine HCl-Naloxone HCl 8-2 MG FILM Place 1 film under the tongue 2 times every day. Allow to dissolve slowly in mouth without chewing or swallowing, no refills with out follow up. (Patient not taking: Reported on 09/15/2023)   [DISCONTINUED] busPIRone (BUSPAR) 10 MG tablet Take 1 tablet by mouth 3 times daily. (Patient not taking: Reported on 09/15/2023)   [DISCONTINUED] busPIRone (BUSPAR) 10 MG tablet Take 1 tablet by mouth 3 times a day (Patient not taking: Reported on 09/15/2023)   [DISCONTINUED] busPIRone (BUSPAR) 10 MG tablet Take 1 tablet by mouth 3 times daily. (Patient not taking: Reported on 09/15/2023)   [DISCONTINUED] busPIRone (BUSPAR) 10 MG tablet Take 1 tablet by mouth 3 times a day (Patient not taking: Reported on 09/15/2023)   [DISCONTINUED] busPIRone (BUSPAR) 10 MG tablet Take 1 tablet (10 mg total) by mouth 3 (three) times daily. (Patient not taking: Reported on 09/15/2023)   [DISCONTINUED] busPIRone (BUSPAR) 10 MG tablet Take 1 tablet (10 mg total) by mouth 3 (three) times  daily. (Patient not taking: Reported on 09/15/2023)   [DISCONTINUED] busPIRone (BUSPAR) 10 MG tablet Take 1 tablet (10 mg total) by mouth 3 (three) times daily. (Patient not taking: Reported on 09/15/2023)   [DISCONTINUED] cyclobenzaprine (FLEXERIL) 10 MG tablet Take 1 tablet by mouth twice a day after meals for 1 month (Patient not taking: Reported on 09/15/2023)   [DISCONTINUED] Doxepin HCl 3 MG TABS Take 1 tablet by mouth every evening 30 minutes before bedtime on  an empty stomach (Patient not taking: Reported on 09/15/2023)   [DISCONTINUED] DULoxetine HCl 40 MG CPEP Take 1 capsule by mouth once every day (Patient not taking: Reported on 09/15/2023)   [DISCONTINUED] DULoxetine HCl 40 MG CPEP Take one capsule by mouth daily. (Patient not taking: Reported on 09/15/2023)   [DISCONTINUED] DULoxetine HCl 40 MG CPEP Take 1 capsule by mouth daily. (Patient not taking: Reported on 09/15/2023)   [DISCONTINUED] DULoxetine HCl 40 MG CPEP Take 1 capsule (40 mg total) by mouth daily. (Patient not taking: Reported on 09/15/2023)   [DISCONTINUED] esomeprazole (NEXIUM) 40 MG capsule Take 1 capsule by mouth daily (Patient not taking: Reported on 09/15/2023)   [DISCONTINUED] esomeprazole (NEXIUM) 40 MG capsule Take 1 capsule (40 mg total) by mouth daily. (Patient not taking: Reported on 09/15/2023)   [DISCONTINUED] esomeprazole (NEXIUM) 40 MG capsule Take 1 capsule (40 mg total) by mouth daily. (Patient not taking: Reported on 09/15/2023)   [DISCONTINUED] esomeprazole (NEXIUM) 40 MG capsule Take 1 capsule (40 mg total) by mouth daily. (Patient not taking: Reported on 09/15/2023)   [DISCONTINUED] Evolocumab (REPATHA SURECLICK) 140 MG/ML SOAJ Inject 1 auto-injector into the skin twice a month (Patient not taking: Reported on 09/15/2023)   [DISCONTINUED] Evolocumab (REPATHA SURECLICK) 140 MG/ML SOAJ Inject 1 pen 2 times a month (Patient not taking: Reported on 09/15/2023)   [DISCONTINUED] Evolocumab (REPATHA SURECLICK) 140 MG/ML SOAJ  Inject 1 syringe twice a month (Patient not taking: Reported on 09/15/2023)   [DISCONTINUED] Evolocumab (REPATHA SURECLICK) 140 MG/ML SOAJ Inject 1 pen under the skin twice a month (Patient not taking: Reported on 09/15/2023)   [DISCONTINUED] Evolocumab (REPATHA SURECLICK) 140 MG/ML SOAJ Inject 1ml twice a month. (Patient not taking: Reported on 09/15/2023)   [DISCONTINUED] Evolocumab (REPATHA SURECLICK) 140 MG/ML SOAJ Inject 1 pen under the skin every 2 weeks as directed (Patient not taking: Reported on 09/15/2023)   [DISCONTINUED] Evolocumab (REPATHA SURECLICK) 140 MG/ML SOAJ Inject 1 mL every 2 weeks. (Patient not taking: Reported on 09/15/2023)   [DISCONTINUED] Evolocumab (REPATHA SURECLICK) 140 MG/ML SOAJ Inject 140 mg into the skin every 14 (fourteen) days. (Patient not taking: Reported on 09/15/2023)   [DISCONTINUED] hydrOXYzine (ATARAX) 25 MG tablet Take 1 tablet (25 mg total) by mouth 3 (three) times daily as needed for anxiety. (Patient not taking: Reported on 09/15/2023)   [DISCONTINUED] hydrOXYzine (ATARAX) 25 MG tablet Take 1 tablet by mouth 3 times every day as needed for anxiety. (Patient not taking: Reported on 09/15/2023)   [DISCONTINUED] hydrOXYzine (ATARAX) 25 MG tablet Take 1 tablet by mouth 3 times a day as needed (Patient not taking: Reported on 09/15/2023)   [DISCONTINUED] hydrOXYzine (ATARAX) 25 MG tablet Take 1 tablet (25 mg total) by mouth 3 (three) times daily as needed for anxiety (Patient not taking: Reported on 09/15/2023)   [DISCONTINUED] hydrOXYzine (ATARAX) 25 MG tablet Take 1 tablet by mouth 3 times every day as needed for Anxiety (Patient not taking: Reported on 09/15/2023)   [DISCONTINUED] hydrOXYzine (ATARAX) 25 MG tablet Take 1 tablet (25 mg total) by mouth 3 (three) times daily. (Patient not taking: Reported on 09/15/2023)   [DISCONTINUED] lidocaine (LIDODERM) 5 % Place 1 patch onto the skin daily. Remove & Discard patch within 12 hours or as directed by MD (Patient not taking:  Reported on 09/15/2023)   [DISCONTINUED] loxapine (LOXITANE) 25 MG capsule Take 1 capsule (25 mg total) by mouth 2 (two) times daily.   [DISCONTINUED] methocarbamol (ROBAXIN) 500 MG tablet Take 1 tablet by mouth every six  hours as needed for pain and spasm (Patient not taking: Reported on 09/15/2023)   [DISCONTINUED] methocarbamol (ROBAXIN) 750 MG tablet Take 1 tablet (750 mg total) by mouth 4 (four) times daily as needed. (Patient not taking: Reported on 09/15/2023)   [DISCONTINUED] methocarbamol (ROBAXIN) 750 MG tablet Take 1 tablet (750 mg total) by mouth 4 (four) times daily. (Patient not taking: Reported on 09/15/2023)   [DISCONTINUED] methocarbamol (ROBAXIN) 750 MG tablet Take 750 mg by mouth 4 (four) times daily. (Patient not taking: Reported on 09/15/2023)   [DISCONTINUED] methylPREDNISolone (MEDROL) 4 MG TBPK tablet Use as directed ON PACKAGE (Patient not taking: Reported on 09/15/2023)   [DISCONTINUED] naloxone (NARCAN) nasal spray 4 mg/0.1 mL Use 1 spray as directed. (Patient not taking: Reported on 09/15/2023)   [DISCONTINUED] naloxone (NARCAN) nasal spray 4 mg/0.1 mL Use 1 (one) Spray as directed (Patient not taking: Reported on 09/15/2023)   [DISCONTINUED] naloxone (NARCAN) nasal spray 4 mg/0.1 mL FOR OPIOID EMERGENCY. Use 1 spray in ONE nostril. May repeat dose every 2 to 3 minutes until responsive or EMS arrives. (Patient not taking: Reported on 09/15/2023)   [DISCONTINUED] nicotine (NICOTINE STEP 2) 14 mg/24hr patch Place 1 patch (14 mg total) onto the skin daily. (Patient not taking: Reported on 09/15/2023)   [DISCONTINUED] nicotine polacrilex (COMMIT) 4 MG lozenge Take 4 mg by mouth as needed for smoking cessation. (Patient not taking: Reported on 09/15/2023)   [DISCONTINUED] nicotine polacrilex (SM NICOTINE POLACRILEX) 4 MG lozenge Take 1 lozenge (4 mg total) by mouth every 2 (two) hours as needed for smoking cessation. (Patient not taking: Reported on 09/15/2023)   [DISCONTINUED] OLANZapine  (ZYPREXA) 10 MG tablet Take 1 tablet (10 mg total) by mouth at bedtime. (Patient not taking: Reported on 09/15/2023)   [DISCONTINUED] oxyCODONE (ROXICODONE) 15 MG immediate release tablet Take 1 tablet (15 mg total) by mouth 4 (four) times daily. Don't drive while taking this medication (Patient not taking: Reported on 09/15/2023)   [DISCONTINUED] Oxycodone HCl 20 MG TABS Take 1 tablet (20 mg total) by mouth 3 (three) times daily (Don't drive while taking this medication) (Patient not taking: Reported on 09/15/2023)   [DISCONTINUED] Oxycodone HCl 20 MG TABS Take 1 tablet (20 mg) by mouth 3 times daily as needed for pain (Patient not taking: Reported on 09/15/2023)   [DISCONTINUED] Oxycodone HCl 20 MG TABS Take 1 tablet by mouth 3 times daily, Don't drive while taking this medication (Patient not taking: Reported on 09/15/2023)   [DISCONTINUED] oxyCODONE-acetaminophen (PERCOCET) 10-325 MG tablet Take 1 tablet by mouth every 4 (four) hours as needed. (Patient not taking: Reported on 09/15/2023)   [DISCONTINUED] pantoprazole (PROTONIX) 40 MG tablet Take 1 tablet (40 mg total) by mouth daily. (Patient not taking: Reported on 09/15/2023)   [DISCONTINUED] polyethylene glycol powder (SM CLEARLAX) 17 GM/SCOOP powder Take 17 grams daily at 12 noon and 6 pm (Patient not taking: Reported on 09/15/2023)   [DISCONTINUED] propranolol ER (INDERAL LA) 120 MG 24 hr capsule Take 1 capsule (120 mg total) by mouth daily. (Patient not taking: Reported on 09/15/2023)   [DISCONTINUED] propranolol ER (INDERAL LA) 60 MG 24 hr capsule Take 1 capsule (60 mg total) by mouth daily. (Patient not taking: Reported on 09/15/2023)   [DISCONTINUED] propranolol ER (INDERAL LA) 60 MG 24 hr capsule Take 1 capsule by mouth daily (Patient not taking: Reported on 09/15/2023)   [DISCONTINUED] QUEtiapine (SEROQUEL XR) 200 MG 24 hr tablet Take 1 tablet by mouth in the evening on an empty stomach or  with a light meal. (Patient not taking: Reported on  09/15/2023)   [DISCONTINUED] QUEtiapine (SEROQUEL XR) 200 MG 24 hr tablet Take 1 tablet by mouth every day in the evening without food or with a light meal (Patient not taking: Reported on 09/15/2023)   [DISCONTINUED] QUEtiapine (SEROQUEL XR) 300 MG 24 hr tablet Take 1 tablet by mouth every day in the evening without food or with a light meal (Patient not taking: Reported on 09/15/2023)   [DISCONTINUED] QUEtiapine (SEROQUEL XR) 400 MG 24 hr tablet Take 1 tablet by mouth once in the evening without food or with a light meal (Patient not taking: Reported on 09/15/2023)   [DISCONTINUED] QUEtiapine (SEROQUEL XR) 400 MG 24 hr tablet Take 1 tablet (400 mg total) by mouth daily in the evening without food or with a light meal. (Patient not taking: Reported on 09/15/2023)   [DISCONTINUED] QUEtiapine (SEROQUEL XR) 400 MG 24 hr tablet Take 1 tablet (400 mg total) by mouth every evening without food or with a light meal. (Patient not taking: Reported on 09/15/2023)   [DISCONTINUED] QUEtiapine Fumarate (SEROQUEL XR) 150 MG 24 hr tablet Take 1 tablet by mouth at bedtime (Patient not taking: Reported on 09/15/2023)   [DISCONTINUED] QUEtiapine Fumarate (SEROQUEL XR) 150 MG 24 hr tablet Take 1 tablet (150 mg total) by mouth every evening without food or with a light meal. (Patient not taking: Reported on 09/15/2023)   [DISCONTINUED] QUEtiapine Fumarate 150 MG TABS Take 1 tablet by mouth at bedtime. (Patient not taking: Reported on 09/15/2023)   [DISCONTINUED] traZODone (DESYREL) 50 MG tablet Take 1 tablet by mouth every day at bedtime. (Patient not taking: Reported on 09/15/2023)   [DISCONTINUED] traZODone (DESYREL) 50 MG tablet Take 1  tablet by mouth at bedtime (Patient not taking: Reported on 09/15/2023)   [DISCONTINUED] traZODone (DESYREL) 50 MG tablet Take 1 tablet by mouth at bedtime. (Patient not taking: Reported on 09/15/2023)   [DISCONTINUED] traZODone (DESYREL) 50 MG tablet Take 1 tablet by mouth once at bedtime (Patient  not taking: Reported on 09/15/2023)   [DISCONTINUED] traZODone (DESYREL) 50 MG tablet Take 1 tablet (50 mg total) by mouth at bedtime. (Patient not taking: Reported on 09/15/2023)   [DISCONTINUED] traZODone (DESYREL) 50 MG tablet Take 1 tablet (50 mg total) by mouth at bedtime. (Patient not taking: Reported on 09/15/2023)   [DISCONTINUED] traZODone (DESYREL) 50 MG tablet Take 1 tablet (50 mg total) by mouth at bedtime. (Patient not taking: Reported on 09/15/2023)   [DISCONTINUED] Vitamin D, Ergocalciferol, (DRISDOL) 1.25 MG (50000 UNIT) CAPS capsule Take 1 capsule (50,000 Units total) by mouth every Thursday. (Patient not taking: Reported on 09/15/2023)   No facility-administered encounter medications on file as of 09/15/2023.    Past Medical History:  Diagnosis Date   Accelerated hypertension 08/23/2016   pt denies this   Anxiety    Arthritis    Bipolar affective disorder (HCC)    Chronic back pain    Chronic kidney disease 2017   AKF - due to Rhabdomyolysis   Coronary artery disease    minimal Mid LAD to Dist LAD lesion, 35% stenosed. Prox Cx lesion, 40% stenosed.   DDD (degenerative disc disease)    Depression    DJD (degenerative joint disease)    Elevated liver function tests    Fibromyalgia    GERD (gastroesophageal reflux disease)    Head injury    Head injury, closed, with concussion    x 3  from falls   Hyperlipidemia  Hypothyroidism    IBS (irritable bowel syndrome)    with constapation   Inguinal hernia    left   Insomnia    Myofascial pain syndrome    Osteopenia    Rhabdomyolysis    Rhabdomyolysis 08/23/2016   Shortness of breath dyspnea    with exertion   Tibial plateau fracture, left    Tremors of nervous system     Past Surgical History:  Procedure Laterality Date   ANTERIOR CERVICAL DECOMP/DISCECTOMY FUSION N/A 09/21/2017   Procedure: ANTERIOR CERVICAL DECOMPRESSION/DISCECTOMY FUSION - CERVICAL THREE-CERVICAL FOUR;  Surgeon: Donalee Citrin, MD;  Location: [facility]  OR;  Service: Neurosurgery;  Laterality: N/A;   ANTERIOR CERVICAL DECOMP/DISCECTOMY FUSION N/A 12/23/2017   Procedure: REMOVAL AND REPLACEMENT OF CERVICAL THREE-FOUR HARDWARE;  Surgeon: Donalee Citrin, MD;  Location: [facility] OR;  Service: Neurosurgery;  Laterality: N/A;   CARDIAC CATHETERIZATION N/A 08/31/2015   Procedure: Left Heart Cath and Coronary Angiography;  Surgeon: Marykay Lex, MD;  Location: [facility] INVASIVE CV LAB;  Service: Cardiovascular;  Laterality: N/A;   Carotid Dopplers Bilateral 02/20/2015   [facility]: Mild, less than 39% left and right internal carotid artery stenosis. No significant plaque burden   CERVICAL DISC SURGERY     C5-7   COLONOSCOPY     COLONOSCOPY     ESOPHAGOGASTRODUODENOSCOPY     FASCIOTOMY Left 10/20/2013   Procedure: LEFT leg ANTERIOR COMPARTMENT FACSCIOTOMY;  Surgeon: Budd Palmer, MD;  Location: [facility] OR;  Service: Orthopedics;  Laterality: Left;   INGUINAL HERNIA REPAIR Left    Nuclear Stress Test  03/06/2015   [facility]: Normal EKG. Low normal EF (49%) normal regional wall motion. No evidence of ischemia or infarction.   ORIF TIBIA PLATEAU Left 10/20/2013   Procedure: OPEN REDUCTION INTERNAL FIXATION (ORIF) LEFT TIBIAL PLATEAU;  Surgeon: Budd Palmer, MD;  Location: [facility] OR;  Service: Orthopedics;  Laterality: Left;   SEPTOPLASTY  2010   SPINAL CORD STIMULATOR BATTERY EXCHANGE N/A 02/08/2016   Procedure: Lumbar spinal cord stimulator implantable pulse generator replacement;  Surgeon: Odette Fraction, MD;  Location: [facility] NEURO ORS;  Service: Neurosurgery;  Laterality: N/A;   SPINAL CORD STIMULATOR BATTERY EXCHANGE N/A 12/23/2017   Procedure: SPINAL CORD STIMULATOR BATTERY EXCHANGE;  Surgeon: Odette Fraction, MD;  Location: [facility] OR;  Service: Neurosurgery;  Laterality: N/A;   SPINAL CORD STIMULATOR IMPLANT     TOTAL HIP ARTHROPLASTY Right 03/15/2019   Procedure: RIGHT TOTAL HIP ARTHROPLASTY ANTERIOR APPROACH;  Surgeon: Marcene Corning, MD;   Location: [facility] ORS;  Service: Orthopedics;  Laterality: Right;   TOTAL HIP ARTHROPLASTY Left 03/05/2021   Procedure: LEFT TOTAL HIP ARTHROPLASTY ANTERIOR APPROACH;  Surgeon: Marcene Corning, MD;  Location: [facility] ORS;  Service: Orthopedics;  Laterality: Left;   TRANSTHORACIC ECHOCARDIOGRAM  02/20/2015   [facility]: Mild concentric LVH. EF 55-60%. Normal regional wall motion. Mild to moderate TR with no significant pulmonary hypertension.    Family History  Problem Relation Age of Onset   Heart disease Mother    Hypertension Mother    Sudden death Mother        Presumably cardiac   Hyperlipidemia Father    Heart attack Father 35       At least 5 MIs. Had CABG.   Heart failure Father    Diabetes Father    Hypertension Father    Kidney disease Father    Diabetes Brother    Lung cancer Maternal Grandmother    Diabetes Paternal Grandmother  Sudden death Paternal Grandmother        Unclear etiology   Diabetes Other    Kidney disease Other    Kidney disease Brother    Hypertension Brother    Sudden death Brother        Thought to be related to heart disease   Throat cancer Brother    Colon cancer Neg Hx    Mental illness Neg Hx     Social History   Socioeconomic History   Marital status: Legally Separated    Spouse name: Not on file   Number of children: 1   Years of education: Not on file   Highest education level: Not on file  Occupational History   Occupation: disabled  Tobacco Use   Smoking status: Every Day    Current packs/day: 1.00    Average packs/day: 1 pack/day for 40.3 years (40.3 ttl pk-yrs)    Types: Cigarettes    Start date: 63    Passive exposure: Current   Smokeless tobacco: Never  Vaping Use   Vaping status: Never Used  Substance and Sexual Activity   Alcohol use: No    Alcohol/week: 0.0 standard drinks of alcohol    Comment: none since 1999- heavy drinker in past   Drug use: Not Currently    Types: Oxycodone, Cocaine    Comment:  rarely- last 2015-   Sexual activity: Not on file  Other Topics Concern   Not on file  Social History Narrative   [name] is a separated father of 1. His daughter is married and recent OT graduate living in [place].   [name] is currently disabled due to chronic back pain.    [name] did some college education.   [name] currently is trying to use electronic cigarettes but has smoked one pack a day for over 35 years.   [name] has recently stopped using prescription benzodiazepine and pain medications because [name] is in the process of changing pain med doctors.   [name] does not routinely exercise secondary to back, neck pain as well as dyspnea.   Social Drivers of Corporate investment banker Strain: Not on file  Food Insecurity: No Food Insecurity (09/15/2023)   Hunger Vital Sign    Worried About Running Out of Food in the Last Year: Never true    Ran Out of Food in the Last Year: Never true  Transportation Needs: Unmet Transportation Needs (09/15/2023)   PRAPARE - Administrator, Civil Service (Medical): Yes    Lack of Transportation (Non-Medical): Yes  Physical Activity: Not on file  Stress: Not on file  Social Connections: Not on file  Intimate Partner Violence: Not At Risk (09/15/2023)   Humiliation, Afraid, Rape, and Kick questionnaire    Fear of Current or Ex-Partner: No    Emotionally Abused: No    Physically Abused: No    Sexually Abused: No    ROS  Per HPI      Objective    BP (!) 178/109   Pulse 84   Temp 98.3 F (36.8 C) (Oral)   Resp 16   Ht 5' 11.06" (1.805 m)   Wt 214 lb 4.8 oz (97.2 kg)   SpO2 98%   BMI 29.84 kg/m   Physical Exam Constitutional:      General: [name] is not in acute distress.    Appearance: Normal appearance. [name] is not ill-appearing.     Comments: Patient presents with a cane.   HENT:  Head: Normocephalic and atraumatic.     Right Ear: External ear normal.     Left Ear: External ear normal.     Nose: Nose normal.     Mouth/Throat:     Mouth:  Mucous membranes are moist.     Pharynx: Oropharynx is clear.  Eyes:     General: No scleral icterus.    Extraocular Movements: Extraocular movements intact.     Conjunctiva/sclera: Conjunctivae normal.     Pupils: Pupils are equal, round, and reactive to light.  Neck:     Vascular: No carotid bruit.  Cardiovascular:     Rate and Rhythm: Normal rate and regular rhythm.     Pulses: Normal pulses.     Heart sounds: Normal heart sounds. No murmur heard.    No friction rub.  Pulmonary:     Effort: Pulmonary effort is normal. No respiratory distress.     Breath sounds: Normal breath sounds. No wheezing, rhonchi or rales.  Musculoskeletal:        General: Normal range of motion.     Cervical back: Neck supple.     Right lower leg: No edema.     Left lower leg: No edema.  Skin:    General: Skin is warm and dry.     Coloration: Skin is not jaundiced or pale.  Neurological:     General: No focal deficit present.     Mental Status: [name] is alert.  Psychiatric:        Mood and Affect: Mood normal.        Behavior: Behavior normal.         Assessment & Plan:   Encounter to establish care  Financial insecurity -     AMB Referral [facility]  Hypothyroidism, unspecified type Assessment & Plan: Hypothyroidism, currently off levothyroxine for 3 weeks. Reports weight gain of 15 pounds, which may be related to thyroid dysfunction. Resuming levothyroxine is necessary to stabilize thyroid function before reassessment. - Refill levothyroxine prescription. - Reassess thyroid function tests in 8 weeks after resuming medication.  Orders: -     Levothyroxine Sodium; Take 1 tablet (50 mcg total) by mouth daily before breakfast.  Dispense: 90 tablet; Refill: 2 -     Comprehensive metabolic panel with GFR; Future -     TSH + free T4; Future  Other chronic back pain Assessment & Plan: Chronic pain due to multiple factors including two plates in the neck, vertebral alignment issues,  herniated discs, sacral problems, and bilateral hip replacements. [name] has been without pain medication for 2.5 months due to issues with the current pain management provider. Reports significant pain, which may be contributing to elevated blood pressure. [facility] in [place] is a potential option for pain management, with [facility] as an alternative if not satisfied. - Refer to [facility] in [place] for pain management. - Consider referral to Physical Medicine and Rehabilitation if not satisfied with current pain management.  Orders: -     Methocarbamol; Take 1 tablet (750 mg total) by mouth 4 (four) times daily as needed.  Dispense: 120 tablet; Refill: 6 -     Diclofenac Sodium; Apply 2 - 4 grams to painful joints up to 4 times daily if needed.  Dispense: 350 g; Refill: 5 -     Ambulatory referral to Pain Clinic  Primary hypertension Assessment & Plan: Elevated blood pressure, with a recent reading of 179/149mmHg. Attributes some of the blood pressure issues  to chronic pain. Has not been on consistent antihypertensive therapy recently. Chlorthalidone is recommended as a first-line treatment to manage hypertension. - Prescribe chlorthalidone 25 mg daily. - Instruct to keep a blood pressure log and follow up via MyChart.  Orders: -     Chlorthalidone; Take 1 tablet (25 mg total) by mouth daily.  Dispense: 30 tablet; Refill: 5 -     CBC with Differential/Platelet; Future -     Comprehensive metabolic panel with GFR; Future  Hyperlipidemia, unspecified hyperlipidemia type Assessment & Plan: Hyperlipidemia, previously managed with Repatha, which significantly reduced cholesterol levels. Has a family hi story of cardiac issues. Repatha is effective in managing cholesterol levels and should be continued. - Refill Repatha prescription.  Orders: -     Repatha SureClick; Inject 140 mg into the skin every 14 (fourteen) days.  Dispense: 2 mL;  Refill: 12 -     Lipid panel; Future  Screening for diabetes mellitus -     Hemoglobin A1c; Future  Irritable bowel syndrome with diarrhea Assessment & Plan: Chronic IBS with diarrhea, managed with dicyclomine. Also has acid reflux and Barrett's esophagus, managed with famotidine and Nexium. Referral to [facility] in [place] is planned for gastroenterology follow-up. - Refer to [facility] in [facility] for gastroenterology follow-up. - Ensure continuation of current medications: dicyclomine, famotidine, and Nexium.  Orders: -     Dicyclomine HCl; Take 1 capsule (10 mg total) by mouth every 8 (eight) hours as needed.  Dispense: 90 capsule; Refill: 2 -     Ambulatory referral to Gastroenterology  Gastroesophageal reflux disease, unspecified whether esophagitis present Assessment & Plan: Resistant. History of endoscopy? Refill famotidine, zofran and nexium Refer to GI.  Orders: -     Famotidine; Take 1 tablet (20 mg total) by mouth 2 (two) times daily.  Dispense: 90 tablet; Refill: 1 -     Ondansetron HCl; Take 1 tablet (4 mg total) by mouth 4 (four) times daily as needed.  Dispense: 120 tablet; Refill: 0 -     Esomeprazole Magnesium; Take 1 capsule (40 mg total) by mouth daily.  Dispense: 90 capsule; Refill: 1 -     Ambulatory referral to Gastroenterology  Tremor Assessment & Plan: Chronic tremor primarily affecting hands and legs, with occasional jerky movements. [name] has been on amantadine for 4-5 years, initially prescribed by a primary care physician who has since passed away. [name] has not been diagnosed with Parkinson's disease and has not seen a neurologist for this condition. Reports that amantadine has been effective in managing the tremor. A neurology referral is necessary for further evaluation and management, as amantadine is not typically managed by primary care. - Refer to neurology for further evaluation and management of tremor. - Hold off on refilling amantadine until  further review.  Orders: -     Ambulatory referral to Neurology  Bipolar I disorder, most recent episode (or current) manic (HCC) Assessment & Plan: Bipolar disorder with psychotic features in the past- no psychotic features today, including auditory and visual hallucinations. Currently well-managed on a regimen of psychiatric medications and reports no current suicidal ideation or panic attacks. Continues psychiatric management with [facility]. - Continue current psychiatric management with [facility].   Return in about 6 weeks (around 10/27/2023) for Chronic Followup.   Teryl Lucy Timberly Yott, PA-C

## 2023-09-15 NOTE — Patient Instructions (Addendum)
 It was nice to see you today!  As we discussed in clinic:  I have prescribed chlorthalidone for your blood pressure, this may make you urinate more frequently so drink plenty of water.  If you have any problems before your next visit feel free to message me via MyChart (minor issues or questions) or call the office, otherwise you may reach out to schedule an office visit.  Thank you! Terrina Docter, PA-C

## 2023-09-15 NOTE — Assessment & Plan Note (Signed)
 Chronic IBS with diarrhea, managed with dicyclomine. Also has acid reflux and Barrett's esophagus, managed with famotidine and Nexium. Referral to [facility] in [place] is planned for gastroenterology follow-up. - Refer to [facility] in [facility] for gastroenterology follow-up. - Ensure continuation of current medications: dicyclomine, famotidine, and Nexium.

## 2023-09-15 NOTE — Assessment & Plan Note (Signed)
 Chronic pain due to multiple factors including two plates in the neck, vertebral alignment issues, herniated discs, sacral problems, and bilateral hip replacements. He has been without pain medication for 2.5 months due to issues with the current pain management provider. Reports significant pain, which may be contributing to elevated blood pressure. [facility] in [place] is a potential option for pain management, with Physical Medicine and Rehabilitation as an alternative if not satisfied. - Refer to [facility] in [place] for pain management. - Consider referral to Physical Medicine and Rehabilitation if not satisfied with current pain management.

## 2023-09-15 NOTE — Assessment & Plan Note (Signed)
 Chronic tremor primarily affecting hands and legs, with occasional jerky movements. He has been on amantadine for 4-5 years, initially prescribed by a primary care physician who has since passed away. He has not been diagnosed with Parkinson's disease and has not seen a neurologist for this condition. Reports that amantadine has been effective in managing the tremor. A neurology referral is necessary for further evaluation and management, as amantadine is not typically managed by primary care. - Refer to neurology for further evaluation and management of tremor. - Hold off on refilling amantadine until further review.

## 2023-09-15 NOTE — Assessment & Plan Note (Signed)
 Bipolar disorder with psychotic features in the past- no psychotic features today, including auditory and visual hallucinations. Currently well-managed on a regimen of psychiatric medications and reports no current suicidal ideation or panic attacks. Continues psychiatric management with [facility]. - Continue current psychiatric management with [facility].

## 2023-09-15 NOTE — Assessment & Plan Note (Signed)
 Hypothyroidism, currently off levothyroxine for 3 weeks. Reports weight gain of 15 pounds, which may be related to thyroid dysfunction. Resuming levothyroxine is necessary to stabilize thyroid function before reassessment. - Refill levothyroxine prescription. - Reassess thyroid function tests in 8 weeks after resuming medication.

## 2023-09-15 NOTE — Assessment & Plan Note (Signed)
 Hyperlipidemia, previously managed with Repatha, which significantly reduced cholesterol levels. Has a family hi story of cardiac issues. Repatha is effective in managing cholesterol levels and should be continued. - Refill Repatha prescription.

## 2023-09-15 NOTE — Assessment & Plan Note (Signed)
 Resistant. History of endoscopy? Refill famotidine, zofran and nexium Refer to GI.

## 2023-09-15 NOTE — Assessment & Plan Note (Signed)
 Elevated blood pressure, with a recent reading of 179/166mmHg. Attributes some of the blood pressure issues to chronic pain. Has not been on consistent antihypertensive therapy recently. Chlorthalidone is recommended as a first-line treatment to manage hypertension. - Prescribe chlorthalidone 25 mg daily. - Instruct to keep a blood pressure log and follow up via MyChart.

## 2023-09-16 ENCOUNTER — Telehealth (HOSPITAL_BASED_OUTPATIENT_CLINIC_OR_DEPARTMENT_OTHER): Payer: Self-pay

## 2023-09-16 ENCOUNTER — Other Ambulatory Visit (HOSPITAL_COMMUNITY): Payer: Self-pay

## 2023-09-16 ENCOUNTER — Other Ambulatory Visit (HOSPITAL_BASED_OUTPATIENT_CLINIC_OR_DEPARTMENT_OTHER): Payer: Self-pay

## 2023-09-16 NOTE — Telephone Encounter (Signed)
 Copied from CRM [id number]. Topic: General - Other >> Sep 16, 2023  3:38 PM Lynnie Saucier S wrote: Reason for CRM: Patient's wife, Rice Chamorro, returned a call from the office. Read note from [place]. She understood, and said she will be bringing the patient in to have labs done.

## 2023-09-16 NOTE — Telephone Encounter (Signed)
Lab appt made

## 2023-09-17 ENCOUNTER — Other Ambulatory Visit (HOSPITAL_BASED_OUTPATIENT_CLINIC_OR_DEPARTMENT_OTHER): Payer: MEDICAID

## 2023-09-17 ENCOUNTER — Other Ambulatory Visit (HOSPITAL_BASED_OUTPATIENT_CLINIC_OR_DEPARTMENT_OTHER): Payer: Self-pay

## 2023-09-17 DIAGNOSIS — E039 Hypothyroidism, unspecified: Secondary | ICD-10-CM

## 2023-09-17 DIAGNOSIS — I10 Essential (primary) hypertension: Secondary | ICD-10-CM

## 2023-09-17 DIAGNOSIS — E785 Hyperlipidemia, unspecified: Secondary | ICD-10-CM

## 2023-09-17 DIAGNOSIS — Z131 Encounter for screening for diabetes mellitus: Secondary | ICD-10-CM

## 2023-09-17 NOTE — Progress Notes (Signed)
 Assessment/Plan:   1.  Parkinsonism  -I had a long counseling session with the patient today.  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  It is quite unusual to be on a combination of both of these drugs at the same time.  QT interval should certainly be monitored, although psychiatry is out of my field of expertise and I will certainly leave this to the prescribing provider.  I explained that one clinically cannot tell the difference between idiopathic parkinsons disease and secondary parkinsonism from medication.  I also explained that even if one is able to get off of the medication, it can take up to 6 months to clinically definitively know if this is idiopathic parkinsons disease.  I did not advise that the patient go off of medication, as this needs to be discussed with the patients prescribing physician.  I did, however, tell the patient that the longer one is on the medication, the worse the symptoms can get.  The patient has an appointment with the prescribing nurse practitioner on Thursday.  2.  Tardive dyskinesia, oral  -TD is a heterogeneous syndrome depending on a subtle balance between several neurotransmitters in the brain, including DA receptor blockade and hypersensitivity of DA and GABA receptors.  This is complicated given that he also has secondary parkinsonism.  He actually does not notice the tardive movement much, and states that the movements of the tongue are really from a sore place that he has in his mouth from wearing his denture plates (currently does not have them in), but I do think that there are some tardive movements.  The tongue does stay in the mouth.  For now, I would be very cautious with something like Ingrezza or Austedo, because that can just make the parkinsonism worse.  Because it does not bother him, I think I  would leave this untreated.   Subjective:   [name] was seen today in the movement disorders clinic for neurologic consultation at the request of Rothfuss, Jacob T, PA-C.  The consultation is for the evaluation of tremor.  Pt with wife who supplements hx.  Patient has been on amantadine  for quite some time.  It was started by a psychiatrist and subsequently taken over by his primary care physician, had passed away.    They report that propranolol  was tried in the past but it made his BP go low  Tremor: Yes.     How long has it been going on? 10+ years  At rest or with activation?  both  Fam hx of tremor?  No.  Located where?  Bilateral UE/LE  Affected by caffeine:  No.  Affected by alcohol:  (doesn't drink any - quit in 1999)  Affected by stress:  Yes.    Affected by fatigue:  Yes.    Spills soup if on spoon:  may or may not  Affects ADL's (tying shoes, brushing teeth, etc):  brushing teeth is okay because wears dentures  Prior antipsychotic medications:  Yes.   currently on quetiapine  XR, 400 mg and quetiapine  150 mg XR; Zyprexa , 20 mg   Other Specific Symptoms:  Voice: "gravely and deeper" Wet Pillows: Yes.   Postural symptoms:  Yes.    Falls?  Yes.  , last fall was few months ago and they don't recall the nature of that;  many near falls Bradykinesia symptoms: difficulty getting out of a chair (because of back/knees/hips - has a Verdon stimulator since 2007); no shuffling Loss of smell:  Yes.   Loss of taste:  Yes.   Urinary Incontinence:  getting ready to have urolift Difficulty Swallowing:  Yes.  , has had esophagus stretched in the past Depression:  Yes.  , follows with NP at [facility].  No EKG have been done Diplopia:  No.    ALLERGIES:   Allergies  Allergen Reactions   Zolpidem  Tartrate Other (See Comments)    Hallucinations and sleep walks   Lyrica [Pregabalin] Other (See Comments)    Caused extreme depression   Flomax [Tamsulosin]    Statins Other (See Comments)     Rhabdomyolysis & Kidney failure    Demerol [Meperidine] Itching, Rash and Hives    CURRENT MEDICATIONS:  Current Outpatient Medications  Medication Instructions   Amantadine  HCl 100 mg, Oral, 2 times daily   aspirin  EC 81 mg, Daily   busPIRone  (BUSPAR ) 30 mg, 3 times daily   chlorthalidone  (HYGROTON ) 25 mg, Oral, Daily   diclofenac  Sodium (VOLTAREN ) 1 % GEL Apply 2 - 4 grams to painful joints up to 4 times daily if needed.   dicyclomine  (BENTYL ) 10 mg, Oral, Every 8 hours PRN   docusate sodium  (COLACE) 100 mg, 3 times daily   DULoxetine  HCl 40 mg, Oral, Daily   esomeprazole  (NEXIUM ) 40 mg, Oral, Daily   famotidine  (PEPCID ) 20 mg, Oral, 2 times daily   hydrOXYzine  (ATARAX ) 25 mg, Oral, 3 times daily   levothyroxine  (SYNTHROID ) 50 mcg, Oral, Daily before breakfast   METAMUCIL FIBER PO Take by mouth. 2 tsp daily   methocarbamol  (ROBAXIN ) 750 mg, Oral, 4 times daily PRN   Multiple Vitamins-Minerals (CERTAVITE/ANTIOXIDANTS) TABS 1 tablet, Oral, Daily   naloxone  (NARCAN ) nasal spray 4 mg/0.1 mL Use 1 spray in nose as directed   OLANZapine  (ZYPREXA ) 20 mg, Daily at bedtime   ondansetron  (ZOFRAN ) 4 mg, Oral, 4 times daily PRN   Oxycodone  HCl 10 mg, Oral, 4 times daily   QUEtiapine  (SEROQUEL  XR) 400 mg, Oral, Nightly   QUEtiapine  Fumarate (SEROQUEL  XR) 150 MG 24 hr tablet Take 1 tablet by mouth every evening without food or with a light meal.   Repatha  SureClick 140 mg, Subcutaneous, Every 14 days   tadalafil (CIALIS) 5 MG tablet 1 tablet, Daily   traZODone  (DESYREL ) 100 mg, Daily at bedtime   Vitamin D  (Ergocalciferol ) (DRISDOL ) 50,000 Units, Oral, Weekly    Objective:   PHYSICAL EXAMINATION:    VITALS:   Vitals:   09/21/23 0923  BP: (!) 168/102  Pulse: 91  SpO2: 97%  Weight: 218 lb (98.9 kg)  Height: 6' (1.829 m)    GEN:  The patient appears stated age and is in NAD. HEENT:  Normocephalic, atraumatic.  He is edentulous.  The mucous membranes are moist. The superficial  temporal arteries are without ropiness or tenderness. CV:  RRR Lungs:  CTAB Neck/HEME:  There are no carotid bruits bilaterally.  Neurological examination:  Orientation: The patient is alert and oriented x3.  Cranial nerves: There is good facial symmetry.  There is facial hypomimia.  Extraocular muscles are intact. The visual fields are full to confrontational testing. The speech is fluent and clear. Soft palate rises symmetrically and there is no tongue deviation. Hearing is intact to conversational tone. Sensation: Sensation is intact to light touch throughout (facial, trunk, extremities). Vibration is intact at the bilateral big toe.  There is no extinction with double simultaneous stimulation.  Motor: Strength is 5/5 in the bilateral upper and lower extremities.   Shoulder shrug is equal and symmetric.  There is no pronator drift. Deep tendon reflexes: Deep tendon reflexes are 2/4 at the bilateral biceps, triceps, brachioradialis, patella and achilles. Plantar responses are downgoing bilaterally.  Movement examination: Tone: There is normal tone in the bilateral upper extremities.  The tone in the lower extremities is normal.  Abnormal movements: there is bilateral LE rest tremor.  No UE rest tremor.  No postural tremor.  Very min intention tremor with the fingers on the R.  There is tongue movement within the mouth, but it does not come out of the mouth. Coordination:  There is no decremation with RAM's, with any form of RAMS, including alternating supination and pronation of the forearm, hand opening and closing, finger taps, heel taps and toe taps.  Gait and Station: The patient has no difficulty arising out of a deep-seated chair without the use of the hands. The patient's stride length is good.   I have reviewed and interpreted the following labs independently   Chemistry      Component Value Date/Time   NA 139 09/17/2023 1615   K 4.5 09/17/2023 1615   CL 99 09/17/2023 1615   CO2 16  (L) 09/17/2023 1615   BUN 26 (H) 09/17/2023 1615   CREATININE 1.16 09/17/2023 1615   CREATININE 1.01 08/30/2015 1413      Component Value Date/Time   CALCIUM  10.9 (H) 09/17/2023 1615   ALKPHOS 122 (H) 09/17/2023 1615   AST 22 09/17/2023 1615   ALT 17 09/17/2023 1615   BILITOT 0.4 09/17/2023 1615      Lab Results  Component Value Date   TSH 1.370 09/17/2023   Lab Results  Component Value Date   WBC 10.2 09/17/2023   HGB 16.7 09/17/2023   HCT 48.3 09/17/2023   MCV 89 09/17/2023   PLT 375 09/17/2023      Total time spent on today's visit was  60 minutes, including both face-to-face time and nonface-to-face time.  Time included that spent on review of records (prior notes available to me/labs/imaging if pertinent), discussing treatment and goals, answering patient's questions and coordinating care.  Cc:  Rothfuss, Jacob T, PA-C

## 2023-09-18 ENCOUNTER — Other Ambulatory Visit (HOSPITAL_COMMUNITY): Payer: Self-pay

## 2023-09-18 ENCOUNTER — Other Ambulatory Visit: Payer: Self-pay

## 2023-09-18 ENCOUNTER — Other Ambulatory Visit (HOSPITAL_BASED_OUTPATIENT_CLINIC_OR_DEPARTMENT_OTHER): Payer: Self-pay

## 2023-09-18 LAB — CBC WITH DIFFERENTIAL/PLATELET
Basophils Absolute: 0.1 10*3/uL (ref 0.0–0.2)
Basos: 1 %
EOS (ABSOLUTE): 0.1 10*3/uL (ref 0.0–0.4)
Eos: 1 %
Hematocrit: 48.3 % (ref 37.5–51.0)
Hemoglobin: 16.7 g/dL (ref 13.0–17.7)
Immature Grans (Abs): 0.1 10*3/uL (ref 0.0–0.1)
Immature Granulocytes: 1 %
Lymphocytes Absolute: 2.5 10*3/uL (ref 0.7–3.1)
Lymphs: 25 %
MCH: 30.9 pg (ref 26.6–33.0)
MCHC: 34.6 g/dL (ref 31.5–35.7)
MCV: 89 fL (ref 79–97)
Monocytes Absolute: 0.9 10*3/uL (ref 0.1–0.9)
Monocytes: 9 %
Neutrophils Absolute: 6.5 10*3/uL (ref 1.4–7.0)
Neutrophils: 63 %
Platelets: 375 10*3/uL (ref 150–450)
RBC: 5.4 x10E6/uL (ref 4.14–5.80)
RDW: 14.1 % (ref 11.6–15.4)
WBC: 10.2 10*3/uL (ref 3.4–10.8)

## 2023-09-18 LAB — COMPREHENSIVE METABOLIC PANEL WITH GFR
ALT: 17 IU/L (ref 0–44)
AST: 22 IU/L (ref 0–40)
Albumin: 5.5 g/dL — ABNORMAL HIGH (ref 3.8–4.9)
Alkaline Phosphatase: 122 IU/L — ABNORMAL HIGH (ref 44–121)
BUN/Creatinine Ratio: 22 — ABNORMAL HIGH (ref 9–20)
BUN: 26 mg/dL — ABNORMAL HIGH (ref 6–24)
Bilirubin Total: 0.4 mg/dL (ref 0.0–1.2)
CO2: 16 mmol/L — ABNORMAL LOW (ref 20–29)
Calcium: 10.9 mg/dL — ABNORMAL HIGH (ref 8.7–10.2)
Chloride: 99 mmol/L (ref 96–106)
Creatinine, Ser: 1.16 mg/dL (ref 0.76–1.27)
Globulin, Total: 3.1 g/dL (ref 1.5–4.5)
Glucose: 102 mg/dL — ABNORMAL HIGH (ref 70–99)
Potassium: 4.5 mmol/L (ref 3.5–5.2)
Sodium: 139 mmol/L (ref 134–144)
Total Protein: 8.6 g/dL — ABNORMAL HIGH (ref 6.0–8.5)
eGFR: 74 mL/min/{1.73_m2} (ref >59)

## 2023-09-18 LAB — LIPID PANEL
Chol/HDL Ratio: 4.3 ratio (ref 0.0–5.0)
Cholesterol, Total: 308 mg/dL — ABNORMAL HIGH (ref 100–199)
HDL: 72 mg/dL (ref >39)
LDL Chol Calc (NIH): 210 mg/dL — ABNORMAL HIGH (ref 0–99)
Triglycerides: 145 mg/dL (ref 0–149)
VLDL Cholesterol Cal: 26 mg/dL (ref 5–40)

## 2023-09-18 LAB — TSH+FREE T4
Free T4: 0.92 ng/dL (ref 0.82–1.77)
TSH: 1.37 u[IU]/mL (ref 0.450–4.500)

## 2023-09-18 LAB — HEMOGLOBIN A1C
Est. average glucose Bld gHb Est-mCnc: 100 mg/dL
Hgb A1c MFr Bld: 5.1 % (ref 4.8–5.6)

## 2023-09-19 ENCOUNTER — Encounter (HOSPITAL_BASED_OUTPATIENT_CLINIC_OR_DEPARTMENT_OTHER): Payer: Self-pay | Admitting: Student

## 2023-09-19 ENCOUNTER — Other Ambulatory Visit (HOSPITAL_COMMUNITY): Payer: Self-pay

## 2023-09-21 ENCOUNTER — Other Ambulatory Visit (HOSPITAL_BASED_OUTPATIENT_CLINIC_OR_DEPARTMENT_OTHER): Payer: Self-pay

## 2023-09-21 ENCOUNTER — Encounter: Payer: Self-pay | Admitting: Neurology

## 2023-09-21 ENCOUNTER — Ambulatory Visit (INDEPENDENT_AMBULATORY_CARE_PROVIDER_SITE_OTHER): Payer: MEDICAID | Admitting: Neurology

## 2023-09-21 VITALS — BP 168/102 | HR 91 | Ht 72.0 in | Wt 218.0 lb

## 2023-09-21 DIAGNOSIS — T43505A Adverse effect of unspecified antipsychotics and neuroleptics, initial encounter: Secondary | ICD-10-CM | POA: Diagnosis not present

## 2023-09-21 DIAGNOSIS — G2401 Drug induced subacute dyskinesia: Secondary | ICD-10-CM

## 2023-09-21 DIAGNOSIS — G2111 Neuroleptic induced parkinsonism: Secondary | ICD-10-CM

## 2023-09-21 NOTE — Patient Instructions (Signed)
 We have discussed that your parkinsonian symptoms are likely due to your zyprexa  (olanzapine )  medication.  The seroquel  may contribute as well.  You need to contact your prescribing physician or provider to discuss what we discussed today.  Do not just stop the medication on your own.  If you are able to get off of the medication,  it can take up to 6 months to resolve some of the symptoms that you are experiencing.  If you are able to get off of the medication AND your symptoms are not resolved in 6 months, then I want to see you back in the office for a follow up appointment.

## 2023-09-22 ENCOUNTER — Other Ambulatory Visit (HOSPITAL_BASED_OUTPATIENT_CLINIC_OR_DEPARTMENT_OTHER): Payer: Self-pay

## 2023-09-22 ENCOUNTER — Encounter (HOSPITAL_BASED_OUTPATIENT_CLINIC_OR_DEPARTMENT_OTHER): Payer: Self-pay | Admitting: Pharmacy Technician

## 2023-09-22 ENCOUNTER — Other Ambulatory Visit (HOSPITAL_COMMUNITY): Payer: Self-pay

## 2023-09-22 MED ORDER — HYDROCODONE-ACETAMINOPHEN 5-325 MG PO TABS
1.0000 | ORAL_TABLET | Freq: Every day | ORAL | 0 refills | Status: DC
  Filled 2023-09-22: qty 30, 30d supply, fill #0

## 2023-09-23 ENCOUNTER — Other Ambulatory Visit (HOSPITAL_COMMUNITY): Payer: Self-pay

## 2023-09-24 ENCOUNTER — Telehealth (HOSPITAL_BASED_OUTPATIENT_CLINIC_OR_DEPARTMENT_OTHER): Payer: Self-pay

## 2023-09-24 ENCOUNTER — Telehealth: Payer: Self-pay | Admitting: *Deleted

## 2023-09-24 ENCOUNTER — Other Ambulatory Visit (HOSPITAL_BASED_OUTPATIENT_CLINIC_OR_DEPARTMENT_OTHER): Payer: Self-pay

## 2023-09-24 MED ORDER — QUETIAPINE FUMARATE ER 50 MG PO TB24
ORAL_TABLET | ORAL | 0 refills | Status: DC
Start: 2023-09-24 — End: 2024-02-09
  Filled 2023-09-24: qty 42, 28d supply, fill #0

## 2023-09-24 NOTE — Progress Notes (Signed)
 Complex Care Management Note Care Guide Note  09/24/2023 Name: [name] MRN: [id number] DOB: 04-08-1966  [name] is a 58 y.o. year old male who is a primary care patient of Rothfuss, Laron Plummer, PA-C . The community resource team was consulted for assistance with Transportation Needs  and Home Modifications  SDOH screenings and interventions completed:  Yes     SDOH Interventions Today    Flowsheet Row Most Recent Value  SDOH Interventions   Housing Interventions Community Resources Provided  [patient waitlisted for housing in [place] has case manager through [employer] Garrie Kand patient advised to talk with CM about housing needs]  Transportation Interventions [employer] Provided, Payor Benefit  [Connected to transportation through [employer] and Case manager Faith Townsend]        Care guide performed the following interventions: Patient provided with information about care guide support team and interviewed to confirm resource needs.  Follow Up Plan:  No further follow up planned at this time. The patient has been provided with needed resources.  Encounter Outcome:  Patient Visit Completed Zaara Sprowl Greenauer-Moran  [facility] HealthPopulation Health Care Guide  Direct Dial:[phone] Fax:[phone] Website: [email or web address]

## 2023-09-24 NOTE — Telephone Encounter (Signed)
 Pt's appeal for repatha  was faxed on 09/23/2023

## 2023-09-25 ENCOUNTER — Other Ambulatory Visit (HOSPITAL_BASED_OUTPATIENT_CLINIC_OR_DEPARTMENT_OTHER): Payer: Self-pay

## 2023-09-25 ENCOUNTER — Other Ambulatory Visit: Payer: Self-pay

## 2023-09-28 ENCOUNTER — Other Ambulatory Visit (HOSPITAL_COMMUNITY): Payer: Self-pay

## 2023-10-05 ENCOUNTER — Other Ambulatory Visit (HOSPITAL_BASED_OUTPATIENT_CLINIC_OR_DEPARTMENT_OTHER): Payer: Self-pay

## 2023-10-05 MED ORDER — ERGOCALCIFEROL 1.25 MG (50000 UT) PO CAPS
50000.0000 [IU] | ORAL_CAPSULE | ORAL | 6 refills | Status: DC
  Filled 2023-10-05 – 2023-11-01 (×2): qty 4, 28d supply, fill #0
  Filled 2023-11-28: qty 4, 28d supply, fill #1
  Filled 2023-12-27: qty 4, 28d supply, fill #2
  Filled 2024-01-23: qty 4, 28d supply, fill #3
  Filled 2024-02-20: qty 4, 28d supply, fill #4
  Filled 2024-03-16: qty 4, 28d supply, fill #5
  Filled 2024-04-16: qty 4, 28d supply, fill #6

## 2023-10-05 MED ORDER — FAMOTIDINE 20 MG PO TABS
20.0000 mg | ORAL_TABLET | Freq: Two times a day (BID) | ORAL | 1 refills | Status: DC
  Filled 2023-10-05: qty 60, 30d supply, fill #0
  Filled 2023-11-18: qty 180, 90d supply, fill #0

## 2023-10-05 MED ORDER — ASPIRIN 325 MG PO TBEC
325.0000 mg | DELAYED_RELEASE_TABLET | Freq: Every day | ORAL | 4 refills | Status: DC
  Filled 2023-10-05: qty 30, 30d supply, fill #0

## 2023-10-05 MED ORDER — DICYCLOMINE HCL 10 MG PO CAPS
10.0000 mg | ORAL_CAPSULE | Freq: Three times a day (TID) | ORAL | 1 refills | Status: DC | PRN
  Filled 2023-10-05 – 2023-11-18 (×2): qty 90, 30d supply, fill #0
  Filled 2023-12-27: qty 90, 30d supply, fill #1

## 2023-10-05 MED ORDER — AMANTADINE HCL 100 MG PO TABS
100.0000 mg | ORAL_TABLET | Freq: Two times a day (BID) | ORAL | 0 refills | Status: DC
  Filled 2023-10-05: qty 60, 30d supply, fill #0

## 2023-10-05 MED ORDER — LEVOTHYROXINE SODIUM 50 MCG PO TABS
50.0000 ug | ORAL_TABLET | Freq: Every day | ORAL | 0 refills | Status: DC
  Filled 2023-10-05 – 2023-11-28 (×2): qty 90, 90d supply, fill #0

## 2023-10-05 MED ORDER — ESOMEPRAZOLE MAGNESIUM 40 MG PO CPDR
40.0000 mg | DELAYED_RELEASE_CAPSULE | Freq: Every day | ORAL | 1 refills | Status: DC
Start: 2023-10-05 — End: 2024-01-07
  Filled 2023-10-05 – 2023-12-27 (×2): qty 90, 90d supply, fill #0

## 2023-10-05 MED ORDER — OXYCODONE HCL 10 MG PO TABS
10.0000 mg | ORAL_TABLET | Freq: Four times a day (QID) | ORAL | 0 refills | Status: DC
  Filled 2023-10-05: qty 20, 5d supply, fill #0
  Filled 2023-10-06: qty 8, 2d supply, fill #1

## 2023-10-06 ENCOUNTER — Other Ambulatory Visit (HOSPITAL_BASED_OUTPATIENT_CLINIC_OR_DEPARTMENT_OTHER): Payer: Self-pay

## 2023-10-06 MED ORDER — ASPIRIN 81 MG PO TBEC
81.0000 mg | DELAYED_RELEASE_TABLET | Freq: Every day | ORAL | 4 refills | Status: AC
  Filled 2023-10-06: qty 90, 90d supply, fill #0

## 2023-10-07 ENCOUNTER — Other Ambulatory Visit (HOSPITAL_BASED_OUTPATIENT_CLINIC_OR_DEPARTMENT_OTHER): Payer: Self-pay

## 2023-10-08 ENCOUNTER — Other Ambulatory Visit (HOSPITAL_BASED_OUTPATIENT_CLINIC_OR_DEPARTMENT_OTHER): Payer: Self-pay

## 2023-10-08 MED ORDER — AMANTADINE HCL 100 MG PO CAPS
100.0000 mg | ORAL_CAPSULE | Freq: Two times a day (BID) | ORAL | 0 refills | Status: DC
  Filled 2023-10-08: qty 100, 50d supply, fill #0
  Filled 2023-10-09: qty 80, 40d supply, fill #0

## 2023-10-09 ENCOUNTER — Other Ambulatory Visit (HOSPITAL_BASED_OUTPATIENT_CLINIC_OR_DEPARTMENT_OTHER): Payer: Self-pay

## 2023-10-12 ENCOUNTER — Other Ambulatory Visit (HOSPITAL_COMMUNITY): Payer: Self-pay

## 2023-10-12 ENCOUNTER — Other Ambulatory Visit (HOSPITAL_BASED_OUTPATIENT_CLINIC_OR_DEPARTMENT_OTHER): Payer: Self-pay

## 2023-10-12 MED ORDER — OXYCODONE HCL 10 MG PO TABS
10.0000 mg | ORAL_TABLET | Freq: Four times a day (QID) | ORAL | 0 refills | Status: DC
Start: 2023-10-12 — End: 2023-10-19
  Filled 2023-10-12: qty 28, 7d supply, fill #0

## 2023-10-13 ENCOUNTER — Other Ambulatory Visit (HOSPITAL_BASED_OUTPATIENT_CLINIC_OR_DEPARTMENT_OTHER): Payer: Self-pay

## 2023-10-19 ENCOUNTER — Other Ambulatory Visit (HOSPITAL_BASED_OUTPATIENT_CLINIC_OR_DEPARTMENT_OTHER): Payer: Self-pay

## 2023-10-19 MED ORDER — OXYCODONE HCL 10 MG PO TABS
10.0000 mg | ORAL_TABLET | Freq: Four times a day (QID) | ORAL | 0 refills | Status: DC
  Filled 2023-10-19: qty 20, 5d supply, fill #0
  Filled 2023-10-19: qty 36, 9d supply, fill #1

## 2023-10-20 ENCOUNTER — Other Ambulatory Visit (HOSPITAL_BASED_OUTPATIENT_CLINIC_OR_DEPARTMENT_OTHER): Payer: Self-pay

## 2023-10-20 HISTORY — PX: OTHER SURGICAL HISTORY: SHX169

## 2023-10-20 MED ORDER — IBUPROFEN 800 MG PO TABS
800.0000 mg | ORAL_TABLET | Freq: Three times a day (TID) | ORAL | 0 refills | Status: DC | PRN
  Filled 2023-10-20: qty 60, 20d supply, fill #0

## 2023-10-20 MED ORDER — DOCUSATE SODIUM 100 MG PO CAPS
100.0000 mg | ORAL_CAPSULE | Freq: Two times a day (BID) | ORAL | 0 refills | Status: AC | PRN
  Filled 2023-10-20 (×2): qty 20, 10d supply, fill #0

## 2023-10-20 MED ORDER — PHENAZOPYRIDINE HCL 100 MG PO TABS
100.0000 mg | ORAL_TABLET | Freq: Three times a day (TID) | ORAL | 0 refills | Status: DC | PRN
  Filled 2023-10-20: qty 9, 3d supply, fill #0

## 2023-10-20 MED ORDER — ACETAMINOPHEN 325 MG PO TABS
650.0000 mg | ORAL_TABLET | ORAL | 1 refills | Status: AC | PRN
  Filled 2023-10-20: qty 60, 5d supply, fill #0

## 2023-10-23 ENCOUNTER — Telehealth (HOSPITAL_BASED_OUTPATIENT_CLINIC_OR_DEPARTMENT_OTHER): Payer: Self-pay

## 2023-10-23 ENCOUNTER — Other Ambulatory Visit (HOSPITAL_BASED_OUTPATIENT_CLINIC_OR_DEPARTMENT_OTHER): Payer: Self-pay

## 2023-10-23 ENCOUNTER — Other Ambulatory Visit: Payer: Self-pay

## 2023-10-23 MED ORDER — URO-SP 118 MG PO CAPS
1.0000 | ORAL_CAPSULE | Freq: Three times a day (TID) | ORAL | 0 refills | Status: DC | PRN
  Filled 2023-10-23: qty 21, 7d supply, fill #0

## 2023-10-23 MED ORDER — DOXYCYCLINE HYCLATE 100 MG PO CAPS
100.0000 mg | ORAL_CAPSULE | Freq: Two times a day (BID) | ORAL | 0 refills | Status: DC
  Filled 2023-10-23: qty 10, 5d supply, fill #0

## 2023-10-23 NOTE — Telephone Encounter (Signed)
Reviewed lab with pt

## 2023-10-27 ENCOUNTER — Encounter (HOSPITAL_BASED_OUTPATIENT_CLINIC_OR_DEPARTMENT_OTHER): Payer: Self-pay | Admitting: Student

## 2023-10-27 ENCOUNTER — Ambulatory Visit (HOSPITAL_BASED_OUTPATIENT_CLINIC_OR_DEPARTMENT_OTHER): Payer: MEDICAID | Admitting: Student

## 2023-10-27 ENCOUNTER — Other Ambulatory Visit (HOSPITAL_BASED_OUTPATIENT_CLINIC_OR_DEPARTMENT_OTHER): Payer: Self-pay

## 2023-10-27 VITALS — BP 123/84 | HR 80 | Temp 98.8°F | Resp 16 | Ht 71.0 in | Wt 213.3 lb

## 2023-10-27 DIAGNOSIS — I10 Essential (primary) hypertension: Secondary | ICD-10-CM | POA: Diagnosis not present

## 2023-10-27 DIAGNOSIS — G720 Drug-induced myopathy: Secondary | ICD-10-CM

## 2023-10-27 DIAGNOSIS — K22719 Barrett's esophagus with dysplasia, unspecified: Secondary | ICD-10-CM

## 2023-10-27 DIAGNOSIS — I209 Angina pectoris, unspecified: Secondary | ICD-10-CM

## 2023-10-27 DIAGNOSIS — K219 Gastro-esophageal reflux disease without esophagitis: Secondary | ICD-10-CM

## 2023-10-27 DIAGNOSIS — R110 Nausea: Secondary | ICD-10-CM

## 2023-10-27 DIAGNOSIS — E785 Hyperlipidemia, unspecified: Secondary | ICD-10-CM

## 2023-10-27 DIAGNOSIS — T466X5A Adverse effect of antihyperlipidemic and antiarteriosclerotic drugs, initial encounter: Secondary | ICD-10-CM

## 2023-10-27 HISTORY — DX: Drug-induced myopathy: G72.0

## 2023-10-27 HISTORY — DX: Nausea: R11.0

## 2023-10-27 HISTORY — DX: Barrett's esophagus with dysplasia, unspecified: K22.719

## 2023-10-27 HISTORY — DX: Hypercalcemia: E83.52

## 2023-10-27 MED ORDER — ONDANSETRON HCL 4 MG PO TABS
4.0000 mg | ORAL_TABLET | Freq: Four times a day (QID) | ORAL | 3 refills | Status: DC | PRN
  Filled 2023-10-27: qty 120, 30d supply, fill #0

## 2023-10-27 NOTE — Patient Instructions (Addendum)
 It was nice to see you today!  As we discussed in clinic:  I will refer you to cardiology.  Please make an appointment with  GI.  If you have any problems before your next visit feel free to message me via MyChart (minor issues or questions) or call the office, otherwise you may reach out to schedule an office visit.  Thank you! Charene Mccallister, PA-C

## 2023-10-27 NOTE — Assessment & Plan Note (Signed)
 Chronic nausea with continued exacerbations, primarily in the mornings. Managed with Zofran , as advised by gastroenterologist. No marijuana use, simplifying the workup. - Prescribe Zofran  4 mg tablets, 120 count with refills - Continue follow-up with gastroenterology

## 2023-10-27 NOTE — Progress Notes (Signed)
 Established Patient Office Visit  Subjective   Patient ID: [name], male    DOB: 23-Jan-1966  Age: 58 y.o. MRN: [id number]  Chief Complaint  Patient presents with   Medical Management of Chronic Issues    Here for follow up. Had prostate implant on 10/20/2023. Was told to take tylenol  1000mg  and ibuprofen  800mg  to manage pain. Nerve implant surgery on 11/12/2023.   Medication Refill    Refill zofran     HPI  Discussed the use of AI scribe software for clinical note transcription with the patient, who gave verbal consent to proceed.  History of Present Illness   KYAIR DITOMMASO is a 58 year old male who presents for follow-up on cholesterol management and post-surgical care.  He has a history of hyperlipidemia with a recent cholesterol level of 210 mg/dL. He previously achieved significant cholesterol reduction with Repatha , but is currently unable to continue this medication due to insurance issues. 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  Approximately 20 years ago, he underwent a heart catheterization due to chest pain. He continues to experience occasional chest pain and uses nitroglycerin  as needed. He is currently seeking a new cardiologist following the passing of his previous one. No swelling in legs.  He recently underwent a Urolift procedure on May 20th and is managing post-operative pain with Tylenol  and ibuprofen . He mentions a lack of coordination between his surgeon and pain management doctor regarding pain medication prescriptions.  He experiences chronic nausea and takes Zofran  4 mg tablets, typically in the morning. He has a history of Barrett's esophagus and continues to follow up with a gastroenterologist.  His blood pressure is generally in the range of 140s/90s, and he is currently taking chlorthalidone  25 mg. He is mindful of his salt intake and reports minimal use of salt in his diet.  He has a history of elevated  calcium  and protein levels, which were noted at a previous visit.  He has a neurostimulator for pain management, which is currently not functioning due to a dead battery. This device typically helps manage about 30% of his pain.  Patient was previously He is currently on Zyprexa  20 mg and was previously on Seroquel , which has been discontinued. He is in the process of adjusting his psychiatric medications with his new psychiatrist.       ROS Per HPI.    Objective:     BP 123/84   Pulse 80   Temp 98.8 F (37.1 C) (Oral)   Resp 16   Ht 5\' 11"  (1.803 m)   Wt 213 lb 4.8 oz (96.8 kg)   SpO2 96%   BMI 29.75 kg/m    Physical Exam Constitutional:      General: He is not in acute distress.    Appearance: Normal appearance. He is not ill-appearing.  HENT:     Head: Normocephalic and atraumatic.     Right Ear: External ear normal.     Left Ear: External ear normal.     Nose: Nose normal.  Eyes:     Conjunctiva/sclera: Conjunctivae normal.  Cardiovascular:     Rate and Rhythm: Normal rate and regular rhythm.     Pulses: Normal pulses.     Heart sounds: Normal heart sounds. No murmur heard.    No friction rub.  Pulmonary:     Effort: Pulmonary effort is normal. No respiratory distress.     Breath sounds: Normal breath sounds. No wheezing, rhonchi or  rales.  Skin:    General: Skin is warm and dry.     Coloration: Skin is not jaundiced or pale.  Neurological:     Mental Status: He is alert.  Psychiatric:        Mood and Affect: Mood normal.        Behavior: Behavior normal.      No results found for any visits on 10/27/23.    The 10-year ASCVD risk score (Arnett DK, et al., 2019) is: 14%    Assessment & Plan:   Chronic nausea Assessment & Plan: Chronic nausea with continued exacerbations, primarily in the mornings. Managed with Zofran , as advised by gastroenterologist. No marijuana use, simplifying the workup. - Prescribe Zofran  4 mg tablets, 120 count with  refills - Continue follow-up with gastroenterology  Orders: -     Ondansetron  HCl; Take 1 tablet (4 mg total) by mouth 4 (four) times daily as needed.  Dispense: 120 tablet; Refill: 3  Statin myopathy  Gastroesophageal reflux disease, unspecified whether esophagitis present Assessment & Plan: Chronic, stable. F/u with GI soon. - Continue famotidine , zofran , and nexium .  Orders: -     Basic metabolic panel with GFR  Hypercalcemia Assessment & Plan: Noted on prior calcium  level, will plan to recheck today.  Orders: -     Basic metabolic panel with GFR  Angina, class III [facility]) Assessment & Plan: Patient needs a new cardiologist, no new symptoms noted. Refer to cardiology.  Orders: -     Ambulatory referral to Cardiology  Primary hypertension Assessment & Plan: Chronic, stable. Blood pressure readings are improved but remain suboptimal at 140s/90s. Current treatment includes chlorthalidone  25 mg. Discussed potential impact of chlorthalidone  on calcium  levels. Advised to reduce sodium intake to prevent fluid retention. - Advise reducing sodium intake - Consider adding losartan if blood pressure remains elevated  Orders: -     Ambulatory referral to Cardiology  Hyperlipidemia, unspecified hyperlipidemia type Assessment & Plan: Chronic, not at goal. LDL levels exceed 190 mg/dL. Previous treatment with Repatha  was effective, but insurance coverage issues have arisen. Statins are contraindicated due to rhabdomyolysis history. He is frustrated with insurance denial and desires to resume Repatha . [employer] insurance for peer-to-peer review to advocate for Repatha  coverage - Start repatha  again if covered.   Barrett's esophagus with dysplasia Assessment & Plan: Chronic condition managed with gastroenterology follow-up. Current symptoms include nausea, managed with Zofran . Advised to take Zofran  at night and in the mornings as needed. - Continue Zofran  4 mg as needed - Follow up  with gastroenterology   Return in about 3 months (around 01/27/2024) for Chronic Followup.    Vara Mairena T Leander Tout, PA-C

## 2023-10-27 NOTE — Assessment & Plan Note (Signed)
 Chronic condition managed with gastroenterology follow-up. Current symptoms include nausea, managed with Zofran . Advised to take Zofran  at night and in the mornings as needed. - Continue Zofran  4 mg as needed - Follow up with gastroenterology

## 2023-10-27 NOTE — Assessment & Plan Note (Signed)
 Patient needs a new cardiologist, no new symptoms noted. Refer to cardiology.

## 2023-10-27 NOTE — Assessment & Plan Note (Signed)
 Chronic, stable. F/u with GI soon. - Continue famotidine , zofran , and nexium .

## 2023-10-27 NOTE — Assessment & Plan Note (Signed)
 Chronic, stable. Blood pressure readings are improved but remain suboptimal at 140s/90s. Current treatment includes chlorthalidone  25 mg. Discussed potential impact of chlorthalidone  on calcium  levels. Advised to reduce sodium intake to prevent fluid retention. - Advise reducing sodium intake - Consider adding losartan if blood pressure remains elevated

## 2023-10-27 NOTE — Assessment & Plan Note (Addendum)
 Chronic, not at goal. LDL levels exceed 190 mg/dL. Previous treatment with Repatha  was effective, but insurance coverage issues have arisen. Statins are contraindicated due to rhabdomyolysis history. He is frustrated with insurance denial and desires to resume Repatha . [employer] insurance for peer-to-peer review to advocate for Repatha  coverage - Start repatha  again if covered.

## 2023-10-27 NOTE — Assessment & Plan Note (Signed)
 Noted on prior calcium  level, will plan to recheck today.

## 2023-10-28 ENCOUNTER — Other Ambulatory Visit (HOSPITAL_BASED_OUTPATIENT_CLINIC_OR_DEPARTMENT_OTHER): Payer: Self-pay

## 2023-10-28 ENCOUNTER — Ambulatory Visit (HOSPITAL_BASED_OUTPATIENT_CLINIC_OR_DEPARTMENT_OTHER): Payer: Self-pay | Admitting: Student

## 2023-10-28 LAB — BASIC METABOLIC PANEL WITH GFR
BUN/Creatinine Ratio: 14 (ref 9–20)
BUN: 15 mg/dL (ref 6–24)
CO2: 19 mmol/L — ABNORMAL LOW (ref 20–29)
Calcium: 9.9 mg/dL (ref 8.7–10.2)
Chloride: 101 mmol/L (ref 96–106)
Creatinine, Ser: 1.09 mg/dL (ref 0.76–1.27)
Glucose: 90 mg/dL (ref 70–99)
Potassium: 3.9 mmol/L (ref 3.5–5.2)
Sodium: 137 mmol/L (ref 134–144)
eGFR: 80 mL/min/{1.73_m2} (ref >59)

## 2023-10-29 ENCOUNTER — Telehealth (HOSPITAL_BASED_OUTPATIENT_CLINIC_OR_DEPARTMENT_OTHER): Payer: Self-pay

## 2023-10-29 NOTE — Telephone Encounter (Signed)
 Spoke to [place], went over labs down to 2017. She is trying to see if they can get repatha  approved. Apologized for issues. She will speak to manager and let us  know.   Communication  Reason for CRM: [name] from [facility] [employer] is calling in to get more information regarding the patient and the reason why he is on the medication Evolocumab  (REPATHA  SURECLICK) 140 MG/ML SOAJ. Please give her a call back at [phone], leave message if needed.

## 2023-10-30 ENCOUNTER — Other Ambulatory Visit (HOSPITAL_BASED_OUTPATIENT_CLINIC_OR_DEPARTMENT_OTHER): Payer: Self-pay

## 2023-10-30 ENCOUNTER — Encounter: Payer: Self-pay | Admitting: Student

## 2023-11-02 ENCOUNTER — Other Ambulatory Visit (HOSPITAL_BASED_OUTPATIENT_CLINIC_OR_DEPARTMENT_OTHER): Payer: Self-pay

## 2023-11-02 MED ORDER — OXYCODONE HCL 10 MG PO TABS
10.0000 mg | ORAL_TABLET | Freq: Four times a day (QID) | ORAL | 0 refills | Status: DC
  Filled 2023-11-02: qty 120, 30d supply, fill #0

## 2023-11-10 ENCOUNTER — Other Ambulatory Visit (HOSPITAL_BASED_OUTPATIENT_CLINIC_OR_DEPARTMENT_OTHER): Payer: Self-pay

## 2023-11-10 DIAGNOSIS — M7918 Myalgia, other site: Secondary | ICD-10-CM | POA: Insufficient documentation

## 2023-11-10 DIAGNOSIS — I2510 Atherosclerotic heart disease of native coronary artery without angina pectoris: Secondary | ICD-10-CM | POA: Insufficient documentation

## 2023-11-10 DIAGNOSIS — K4090 Unilateral inguinal hernia, without obstruction or gangrene, not specified as recurrent: Secondary | ICD-10-CM | POA: Insufficient documentation

## 2023-11-10 DIAGNOSIS — F419 Anxiety disorder, unspecified: Secondary | ICD-10-CM | POA: Insufficient documentation

## 2023-11-10 DIAGNOSIS — M8580 Other specified disorders of bone density and structure, unspecified site: Secondary | ICD-10-CM | POA: Insufficient documentation

## 2023-11-10 DIAGNOSIS — M797 Fibromyalgia: Secondary | ICD-10-CM | POA: Insufficient documentation

## 2023-11-10 DIAGNOSIS — S060XAA Concussion with loss of consciousness status unknown, initial encounter: Secondary | ICD-10-CM | POA: Insufficient documentation

## 2023-11-10 DIAGNOSIS — G4700 Insomnia, unspecified: Secondary | ICD-10-CM | POA: Insufficient documentation

## 2023-11-10 DIAGNOSIS — S82142A Displaced bicondylar fracture of left tibia, initial encounter for closed fracture: Secondary | ICD-10-CM | POA: Insufficient documentation

## 2023-11-10 DIAGNOSIS — S0990XA Unspecified injury of head, initial encounter: Secondary | ICD-10-CM | POA: Insufficient documentation

## 2023-11-10 DIAGNOSIS — R7989 Other specified abnormal findings of blood chemistry: Secondary | ICD-10-CM | POA: Insufficient documentation

## 2023-11-10 DIAGNOSIS — R251 Tremor, unspecified: Secondary | ICD-10-CM | POA: Insufficient documentation

## 2023-11-10 DIAGNOSIS — M1990 Unspecified osteoarthritis, unspecified site: Secondary | ICD-10-CM | POA: Insufficient documentation

## 2023-11-10 DIAGNOSIS — F319 Bipolar disorder, unspecified: Secondary | ICD-10-CM | POA: Insufficient documentation

## 2023-11-11 ENCOUNTER — Ambulatory Visit: Payer: MEDICAID | Attending: Cardiology | Admitting: Cardiology

## 2023-11-11 ENCOUNTER — Other Ambulatory Visit: Payer: Self-pay

## 2023-11-11 ENCOUNTER — Other Ambulatory Visit (HOSPITAL_BASED_OUTPATIENT_CLINIC_OR_DEPARTMENT_OTHER): Payer: Self-pay

## 2023-11-11 ENCOUNTER — Encounter: Payer: Self-pay | Admitting: Cardiology

## 2023-11-11 VITALS — BP 120/90 | HR 80 | Ht 71.0 in | Wt 214.0 lb

## 2023-11-11 DIAGNOSIS — T466X5A Adverse effect of antihyperlipidemic and antiarteriosclerotic drugs, initial encounter: Secondary | ICD-10-CM | POA: Insufficient documentation

## 2023-11-11 DIAGNOSIS — E785 Hyperlipidemia, unspecified: Secondary | ICD-10-CM | POA: Diagnosis not present

## 2023-11-11 DIAGNOSIS — I25118 Atherosclerotic heart disease of native coronary artery with other forms of angina pectoris: Secondary | ICD-10-CM | POA: Diagnosis not present

## 2023-11-11 DIAGNOSIS — I10 Essential (primary) hypertension: Secondary | ICD-10-CM | POA: Diagnosis not present

## 2023-11-11 DIAGNOSIS — G720 Drug-induced myopathy: Secondary | ICD-10-CM | POA: Insufficient documentation

## 2023-11-11 MED ORDER — METOPROLOL TARTRATE 100 MG PO TABS
100.0000 mg | ORAL_TABLET | Freq: Once | ORAL | 0 refills | Status: DC
  Filled 2023-11-11: qty 1, 1d supply, fill #0

## 2023-11-11 MED ORDER — ISOSORBIDE MONONITRATE ER 30 MG PO TB24
30.0000 mg | ORAL_TABLET | Freq: Every day | ORAL | 3 refills | Status: DC
  Filled 2023-11-11: qty 90, 90d supply, fill #0

## 2023-11-11 NOTE — Progress Notes (Signed)
 Cardiology Consultation:    Date:  11/11/2023   ID:  [name], DOB [birth year], MRN [id number]  PCP:  Cherylene Corrente, PA-C  Cardiologist:  Ralene Burger, MD   Referring MD: Cherylene Corrente, PA-C   No chief complaint on file.   History of Present Illness:    [name] is a 58 y.o. male who is being seen today for the evaluation of chest pain at the request of Rothfuss, Laron Plummer, PA-C.  Past medical history significant for chronic smoking, essential hypertension, dyslipidemia with baseline LDL of 210, he was on Repatha  however stopped couple months ago he is back on it, intolerant to statin, chronic pain some chronic orthopedic back issue.  He was referred to us  because of chest pain.  He did have cardiac catheterization 2017 which showed only mild disease involving the LAD and circumflex.  He complained of having chest pain pain happen every couple weeks it happen at rest sometimes happen with exercise associated with sweating and some shortness of breath.  He takes nitroglycerin  with relief.  His ability to exercise is limited because of chronic back problem he walks around with a cane.  Still continues to smoke at least 1 pack/day.  Does have family history of premature coronary artery disease not on any special diet.  No dizziness no passing out no swelling of lower extremities.  He also does have some anxiety  Past Medical History:  Diagnosis Date   Accelerated hypertension 08/23/2016   pt denies this   AKI (acute kidney injury) (HCC) 08/23/2016   Angina, class III (HCC) 08/18/2015   Anxiety    Anxiety state 10/28/2006   Qualifier: Diagnosis of   By: Tracy Friedlander      IMO SNOMED Dx Update Oct 2024     Arthritis    Barrett's esophagus with dysplasia 10/27/2023   Bipolar affective disorder (HCC)    Bipolar I disorder, most recent episode (or current) manic (HCC) 10/28/2006   >>OVERVIEW FOR DISORDER, BIPOLAR NOS WRITTEN ON 07/10/2010  5:22 PM BY INTERFACE, PROBLEM  LIST IN     Qualifier: Diagnosis of   By: Tracy Friedlander         Chronic back pain    Chronic kidney disease 2017   AKF - due to Rhabdomyolysis   Chronic kidney disease 2017   AKF - due to Rhabdomyolysis   Chronic nausea 10/27/2023   Cocaine use disorder, mild, abuse (HCC) 12/26/2015   Coronary artery disease    minimal Mid LAD to Dist LAD lesion, 35% stenosed. Prox Cx lesion, 40% stenosed.   Degenerative disc disease, lumbar 09/07/2013   DJD (degenerative joint disease)    Drug overdose, multiple drugs 08/23/2016   Elevated liver function tests    Family history of premature coronary artery disease 08/18/2015   Fibromyalgia    GERD 10/28/2006   Qualifier: Diagnosis of   By: Tracy Friedlander         Head injury    Head injury, closed, with concussion    x 3  from falls   History of rhabdomyolysis 12/26/2015   History of suicidal ideation 10/02/2013   2010, 2012     Hx of renal failure 12/26/2015   Hypercalcemia 10/27/2023   Hyperlipidemia    Hypothyroidism    Inguinal hernia    left   Insomnia    Irritable bowel syndrome 08/09/2008   Qualifier: Diagnosis of   By: Willy Harvest MD, Isidor Marek  Leg pain 02/24/2011   Liver injury 12/26/2015   Multiple falls 10/02/2013   Myofascial pain syndrome    Neck pain 02/24/2011   Osteopenia    PAIN, CHRONIC NEC 10/28/2006   Qualifier: Diagnosis of   By: Jalene Mayor DO, Yvonne         Postural imbalance with levoscoliosis, h/o 10/02/2013   Primary hypertension 08/23/2016   Primary localized osteoarthritis of right hip 03/15/2019   Primary osteoarthritis of left hip 03/05/2021   Primary osteoarthritis of right hip 03/15/2019   Pseudoarthrosis of cervical spine (HCC) 12/23/2017   Rhabdomyolysis 08/23/2016   Right hip pain 02/24/2011   Spinal cord stimulator, status-post 10/02/2013   Per Dr. Selestino Dakin 2010     Spinal stenosis of cervical region 09/21/2017   Statin myopathy 10/27/2023   Tachycardia 08/23/2016   Tibial plateau  fracture, left    Tobacco use disorder, continuous 08/18/2015   Transaminitis 08/23/2016   Tremor 09/15/2023   Tremors of nervous system     Past Surgical History:  Procedure Laterality Date   ANTERIOR CERVICAL DECOMP/DISCECTOMY FUSION N/A 09/21/2017   Procedure: ANTERIOR CERVICAL DECOMPRESSION/DISCECTOMY FUSION - CERVICAL THREE-CERVICAL FOUR;  Surgeon: Gearl Keens, MD;  Location: [facility] OR;  Service: Neurosurgery;  Laterality: N/A;   ANTERIOR CERVICAL DECOMP/DISCECTOMY FUSION N/A 12/23/2017   Procedure: REMOVAL AND REPLACEMENT OF CERVICAL THREE-FOUR HARDWARE;  Surgeon: Gearl Keens, MD;  Location: [facility] OR;  Service: Neurosurgery;  Laterality: N/A;   CARDIAC CATHETERIZATION N/A 08/31/2015   Procedure: Left Heart Cath and Coronary Angiography;  Surgeon: Arleen Lacer, MD;  Location: [facility] INVASIVE CV LAB;  Service: Cardiovascular;  Laterality: N/A;   Carotid Dopplers Bilateral 02/20/2015   [facility]: Mild, less than 39% left and right internal carotid artery stenosis. No significant plaque burden   CERVICAL DISC SURGERY     C5-7   COLONOSCOPY     COLONOSCOPY     ESOPHAGOGASTRODUODENOSCOPY     FASCIOTOMY Left 10/20/2013   Procedure: LEFT leg ANTERIOR COMPARTMENT FACSCIOTOMY;  Surgeon: Arlette Lagos, MD;  Location: [facility] OR;  Service: Orthopedics;  Laterality: Left;   INGUINAL HERNIA REPAIR Left    Nuclear Stress Test  03/06/2015   [facility]: Normal EKG. Low normal EF (49%) normal regional wall motion. No evidence of ischemia or infarction.   ORIF TIBIA PLATEAU Left 10/20/2013   Procedure: OPEN REDUCTION INTERNAL FIXATION (ORIF) LEFT TIBIAL PLATEAU;  Surgeon: Arlette Lagos, MD;  Location: [facility] OR;  Service: Orthopedics;  Laterality: Left;   prostate implant  10/20/2023   SEPTOPLASTY  2010   SPINAL CORD STIMULATOR BATTERY EXCHANGE N/A 02/08/2016   Procedure: Lumbar spinal cord stimulator implantable pulse generator replacement;  Surgeon: Gerri Kras, MD;  Location: [facility]  NEURO ORS;  Service: Neurosurgery;  Laterality: N/A;   SPINAL CORD STIMULATOR BATTERY EXCHANGE N/A 12/23/2017   Procedure: SPINAL CORD STIMULATOR BATTERY EXCHANGE;  Surgeon: Gerri Kras, MD;  Location: [facility] OR;  Service: Neurosurgery;  Laterality: N/A;   SPINAL CORD STIMULATOR IMPLANT     TOTAL HIP ARTHROPLASTY Right 03/15/2019   Procedure: RIGHT TOTAL HIP ARTHROPLASTY ANTERIOR APPROACH;  Surgeon: Dayne Even, MD;  Location: [facility] ORS;  Service: Orthopedics;  Laterality: Right;   TOTAL HIP ARTHROPLASTY Left 03/05/2021   Procedure: LEFT TOTAL HIP ARTHROPLASTY ANTERIOR APPROACH;  Surgeon: Dayne Even, MD;  Location: [facility] ORS;  Service: Orthopedics;  Laterality: Left;   TRANSTHORACIC ECHOCARDIOGRAM  02/20/2015   [facility]: Mild concentric LVH. EF 55-60%. Normal regional wall motion. Mild to  moderate TR with no significant pulmonary hypertension.    Current Medications: Current Meds  Medication Sig   acetaminophen  (TYLENOL ) 325 MG tablet Take 2 tablets (650 mg total) by mouth every 4 (four) hours as needed for mild pain (1-3) or moderate pain (4-6). (Patient taking differently: Take 325 mg by mouth every 12 (twelve) hours.)   amantadine  (SYMMETREL ) 100 MG capsule Take 1 capsule (100 mg total) by mouth 2 (two) times daily.   aspirin  EC (ASPIRIN  81) 81 MG tablet Take 1 tablet (81 mg total) by mouth daily.   busPIRone  (BUSPAR ) 30 MG tablet Take 30 mg by mouth 3 (three) times daily.   chlorthalidone  (HYGROTON ) 25 MG tablet Take 1 tablet (25 mg total) by mouth daily.   diclofenac  Sodium (VOLTAREN ) 1 % GEL Apply 2 - 4 grams to painful joints up to 4 times daily if needed.   dicyclomine  (BENTYL ) 10 MG capsule Take 1 capsule (10 mg total) by mouth every 8 (eight) hours as needed.   docusate sodium  (COLACE) 100 MG capsule Take 1 capsule (100 mg total) by mouth 2 (two) times daily as needed for constipation. (Patient taking differently: Take 100 mg by mouth 3 (three) times daily.)    DULoxetine  HCl 40 MG CPEP Take 1 capsule (40 mg total) by mouth daily.   ergocalciferol  (VITAMIN D2) 1.25 MG (50000 UT) capsule Take 1 capsule (50,000 Units total) by mouth once a week.   esomeprazole  (NEXIUM ) 40 MG capsule Take 1 capsule (40 mg total) by mouth daily.   Evolocumab  (REPATHA  SURECLICK) 140 MG/ML SOAJ Inject 140 mg into the skin every 14 (fourteen) days.   famotidine  (PEPCID ) 20 MG tablet Take 1 tablet (20 mg total) by mouth 2 (two) times daily.   hydrOXYzine  (ATARAX ) 25 MG tablet Take 1 tablet (25 mg total) by mouth 3 (three) times daily.   ibuprofen  (ADVIL ) 800 MG tablet Take 1 tablet (800 mg total) by mouth every 8 (eight) hours as needed for mild pain (1-3) or moderate pain (4-6).   levothyroxine  (SYNTHROID ) 50 MCG tablet Take 1 tablet (50 mcg total) by mouth daily.   METAMUCIL FIBER PO Take by mouth. 2 tsp daily   methocarbamol  (ROBAXIN ) 750 MG tablet Take 1 tablet (750 mg total) by mouth 4 (four) times daily as needed.   Multiple Vitamins-Minerals (CERTAVITE/ANTIOXIDANTS) TABS Take 1 tablet by mouth daily.   naloxone  (NARCAN ) nasal spray 4 mg/0.1 mL Use 1 spray in nose as directed   OLANZapine  (ZYPREXA ) 20 MG tablet Take 20 mg by mouth at bedtime.   ondansetron  (ZOFRAN ) 4 MG tablet Take 1 tablet (4 mg total) by mouth 4 (four) times daily as needed.   Oxycodone  HCl 10 MG TABS Take 1 tablet (10 mg total) by mouth 4 (four) times daily.   traZODone  (DESYREL ) 100 MG tablet Take 100 mg by mouth at bedtime.     Allergies:   Zolpidem  tartrate, Lyrica [pregabalin], Flomax [tamsulosin], Statins, and Demerol [meperidine]   Social History   Socioeconomic History   Marital status: Legally Separated    Spouse name: Not on file   Number of children: 1   Years of education: Not on file   Highest education level: Not on file  Occupational History   Occupation: disabled  Tobacco Use   Smoking status: Every Day    Current packs/day: 1.00    Average packs/day: 1 pack/day for 40.4  years (40.4 ttl pk-yrs)    Types: Cigarettes    Start date: 40  Passive exposure: Current   Smokeless tobacco: Never  Vaping Use   Vaping status: Never Used  Substance and Sexual Activity   Alcohol use: No    Alcohol/week: 0.0 standard drinks of alcohol    Comment: none since 1999- heavy drinker in past   Drug use: Not Currently    Types: Oxycodone , Cocaine    Comment: rarely- last 2015-   Sexual activity: Not on file  Other Topics Concern   Not on file  Social History Narrative   He is a separated father of 1. His daughter is married and recent OT graduate living in [place].   He is currently disabled due to chronic back pain.    He did some college education.   He currently is trying to use electronic cigarettes but has smoked one pack a day for over 35 years.   He has recently stopped using prescription benzodiazepine and pain medications because he is in the process of changing pain med doctors.   He does not routinely exercise secondary to back, neck pain as well as dyspnea.   Right handed    Social Drivers of Health   Financial Resource Strain: Not on file  Food Insecurity: No Food Insecurity (09/15/2023)   Hunger Vital Sign    Worried About Running Out of Food in the Last Year: Never true    Ran Out of Food in the Last Year: Never true  Transportation Needs: Unmet Transportation Needs (09/15/2023)   PRAPARE - Administrator, Civil Service (Medical): Yes    Lack of Transportation (Non-Medical): Yes  Physical Activity: Not on file  Stress: Not on file  Social Connections: Not on file     Family History: The patient's family history includes Diabetes in his brother, father, paternal grandmother, and another family member; Epilepsy in his daughter; Heart attack (age of onset: 47) in his father; Heart disease in his mother; Heart failure in his father; Hyperlipidemia in his father; Hypertension in his father and mother; Kidney disease in his father and  another family member; Lung cancer in his maternal grandmother; Sudden death in his mother and paternal grandmother. There is no history of Colon cancer or Mental illness. ROS:   Please see the history of present illness.    All 14 point review of systems negative except as described per history of present illness.  EKGs/Labs/Other Studies Reviewed:    The following studies were reviewed today:   EKG:  EKG Interpretation Date/Time:  Wednesday November 11 2023 08:08:33 EDT Ventricular Rate:  80 PR Interval:  146 QRS Duration:  118 QT Interval:  394 QTC Calculation: 454 R Axis:   59  Text Interpretation: Normal sinus rhythm Non-specific intra-ventricular conduction delay Borderline ECG When compared with ECG of 03-May-2022 18:29, PREVIOUS ECG IS PRESENT Confirmed by Ralene Burger [phone]) on 11/11/2023 8:15:52 AM    Recent Labs: 09/17/2023: ALT 17; Hemoglobin 16.7; Platelets 375; TSH 1.370 10/27/2023: BUN 15; Creatinine, Ser 1.09; Potassium 3.9; Sodium 137  Recent Lipid Panel    Component Value Date/Time   CHOL 308 (H) 09/17/2023 1615   TRIG 145 09/17/2023 1615   HDL 72 09/17/2023 1615   CHOLHDL 4.3 09/17/2023 1615   CHOLHDL 3.1 07/16/2021 0615   VLDL 30 07/16/2021 0615   LDLCALC 210 (H) 09/17/2023 1615    Physical Exam:    VS:  BP (!) 120/90   Pulse 80   Ht 5' 11 (1.803 m)   Wt 214 lb (97.1 kg)  SpO2 97%   BMI 29.85 kg/m     Wt Readings from Last 3 Encounters:  11/11/23 214 lb (97.1 kg)  10/27/23 213 lb 4.8 oz (96.8 kg)  09/21/23 218 lb (98.9 kg)     GEN:  Well nourished, well developed in no acute distress HEENT: Normal NECK: No JVD; No carotid bruits LYMPHATICS: No lymphadenopathy CARDIAC: RRR, no murmurs, no rubs, no gallops RESPIRATORY:  Clear to auscultation without rales, wheezing or rhonchi  ABDOMEN: Soft, non-tender, non-distended MUSCULOSKELETAL:  No edema; No deformity  SKIN: Warm and dry NEUROLOGIC:  Alert and oriented x 3 PSYCHIATRIC:  Normal  affect   ASSESSMENT:    1. Primary hypertension   2. Coronary artery disease of native artery of native heart with stable angina pectoris (HCC)   3. Statin myopathy   4. Dyslipidemia    PLAN:    In order of problems listed above:  Coronary disease does have stable angina pectoris.  Will schedule him to have coronary CT angio to try to determine extensibility of the problem.  What main pain concern is the fact that he did not take cholesterol medication for a while with his LDL being very high anticipated to have some significant problem.  Likely overall angina happen every couple weeks stable pattern.  Will start Imdur 30 he takes a really antiplatelet therapy we will continue we will try to assess significant of this problem based on coronary CT angio. Dyspnea exertion multifactorial smoking play significant role here.  Will ask him to have an echocardiogram to assess left ventricle ejection fraction. Dyslipidemia I did review K PN LDL 210!Aaron Aas  HDL 72.  He is started with the Repatha  week ago.  Will give him at least 1 month maybe 6 weeks before recheck his fasting lipid profile. Smoking he is probably spent at least 5 minutes talking about his significant only for himself to quit.   Medication Adjustments/Labs and Tests Ordered: Current medicines are reviewed at length with the patient today.  Concerns regarding medicines are outlined above.  Orders Placed This Encounter  Procedures   EKG 12-Lead   No orders of the defined types were placed in this encounter.   Signed, Manfred Seed, MD, [facility]. 11/11/2023 8:29 AM    [facility]

## 2023-11-11 NOTE — Patient Instructions (Signed)
 Medication Instructions:  Your physician has recommended you make the following change in your medication:   START: Imdur 30 mg daily  *If you need a refill on your cardiac medications before your next appointment, please call your pharmacy*  Lab Work: Your physician recommends that you return for lab work in:   Labs today: BMP  If you have labs (blood work) drawn today and your tests are completely normal, you will receive your results only by: MyChart Message (if you have MyChart) OR A paper copy in the mail If you have any lab test that is abnormal or we need to change your treatment, we will call you to review the results.  Testing/Procedures: Your physician has requested that you have an echocardiogram. Echocardiography is a painless test that uses sound waves to create images of your heart. It provides your doctor with information about the size and shape of your heart and how well your heart's chambers and valves are working. This procedure takes approximately one hour. There are no restrictions for this procedure. Please do NOT wear cologne, perfume, aftershave, or lotions (deodorant is allowed). Please arrive 15 minutes prior to your appointment time.  Please note: We ask at that you not bring children with you during ultrasound (echo/ vascular) testing. Due to room size and safety concerns, children are not allowed in the ultrasound rooms during exams. Our front office staff cannot provide observation of children in our lobby area while testing is being conducted. An adult accompanying a patient to their appointment will only be allowed in the ultrasound room at the discretion of the ultrasound technician under special circumstances. We apologize for any inconvenience.    Your cardiac CT will be scheduled at one of the below locations:   [facility] [address] [place], [place] [address] [phone]  OR  [facility] [address] [place], [place] [address] [phone]  OR   [facility] [address] [place], [place] [address] [phone]  OR   [facility] [address] [place], [place] [address] [phone]  OR   [facility] Mom. [facility] and Vascular Tower [address]  [place], [place] [address] Opening September 28, 2023  If scheduled at [facility], please arrive at the [facility] and Children's Entrance (Entrance C2) of [facility] 30 minutes prior to test start time. You can use the FREE valet parking offered at entrance C (encouraged to control the heart rate for the test)  Proceed to the [facility] Radiology Department (first floor) to check-in and test prep.   All radiology patients and guests should use entrance C2 at [facility], accessed from [facility], even though the hospital's physical address listed is [address].    If scheduled at the Heart and Vascular Tower at [address], please enter the parking lot using the [address] entrance and use the FREE valet service at the patient drop-off area. Enter the buidling and check-in with registration on the main floor.  If scheduled at [facility] or [facility], please arrive 15 mins early for check-in and test prep.  There is spacious parking and easy access to the radiology department from the [facility] Heart and Vascular entrance. Please enter here and check-in with the desk attendant.   If scheduled at [facility], please arrive 30 minutes early for check-in and test prep.  Please follow these instructions carefully (unless otherwise directed):  An IV will be required for this test and Nitroglycerin  will be given.  Hold all erectile dysfunction medications at least 3 days (72 hrs) prior to test. (Ie viagra, cialis, sildenafil, tadalafil, etc)   On the Night  Before the Test: Be sure to Drink plenty of water . Do not consume any caffeinated/decaffeinated beverages or chocolate 12 hours prior to your test. Do not take any antihistamines 12 hours prior to your test.  On the Day of the Test: Drink plenty of water  until 1 hour prior to the test. Do not eat any food 1 hour prior to test. You may take your regular medications prior to the test.  Take metoprolol  (Lopressor ) two hours prior to test. If you take Chlorthalidone , please HOLD on the morning of the test. Patients who wear a continuous glucose monitor MUST remove the device prior to scanning.      After the Test: Drink plenty of water . After receiving IV contrast, you may experience a mild flushed feeling. This is normal. On occasion, you may experience a mild rash up to 24 hours after the test. This is not dangerous. If this occurs, you can take Benadryl  25 mg, Zyrtec, Claritin, or Allegra and increase your fluid intake. (Patients taking Tikosyn should avoid Benadryl , and may take Zyrtec, Claritin, or Allegra) If you experience trouble breathing, this can be serious. If it is severe call 911 IMMEDIATELY. If it is mild, please call our office.  We will call to schedule your test 2-4 weeks out understanding that some insurance companies will need an authorization prior to the service being performed.   For more information and frequently asked questions, please visit our website : [email or web address]  For non-scheduling related questions, please contact the cardiac imaging nurse navigator should you have any questions/concerns: Cardiac Imaging Nurse Navigators Direct Office Dial: [phone]   For scheduling needs, including cancellations and rescheduling, please call [place], [phone].   Follow-Up: At [facility], you and your health needs are our priority.  As part of our continuing mission to provide you with exceptional heart care, our providers are all  part of one team.  This team includes your primary Cardiologist (physician) and Advanced Practice Providers or APPs (Physician Assistants and Nurse Practitioners) who all work together to provide you with the care you need, when you need it.  Your next appointment:   2 month(s)  Provider:   Ralene Burger, MD    We recommend signing up for the patient portal called MyChart.  Sign up information is provided on this After Visit Summary.  MyChart is used to connect with patients for Virtual Visits (Telemedicine).  Patients are able to view lab/test results, encounter notes, upcoming appointments, etc.  Non-urgent messages can be sent to your provider as well.   To learn more about what you can do with MyChart, go to [email or web address].   Other Instructions None

## 2023-11-12 ENCOUNTER — Other Ambulatory Visit (HOSPITAL_BASED_OUTPATIENT_CLINIC_OR_DEPARTMENT_OTHER): Payer: Self-pay

## 2023-11-12 LAB — BASIC METABOLIC PANEL WITH GFR
BUN/Creatinine Ratio: 9 (ref 9–20)
BUN: 11 mg/dL (ref 6–24)
CO2: 22 mmol/L (ref 20–29)
Calcium: 10.4 mg/dL — ABNORMAL HIGH (ref 8.7–10.2)
Chloride: 97 mmol/L (ref 96–106)
Creatinine, Ser: 1.24 mg/dL (ref 0.76–1.27)
Glucose: 97 mg/dL (ref 70–99)
Potassium: 4 mmol/L (ref 3.5–5.2)
Sodium: 137 mmol/L (ref 134–144)
eGFR: 68 mL/min/{1.73_m2} (ref >59)

## 2023-11-12 MED ORDER — OXYCODONE HCL 5 MG PO TABS
5.0000 mg | ORAL_TABLET | ORAL | 0 refills | Status: DC | PRN
  Filled 2023-11-12: qty 20, 4d supply, fill #0

## 2023-11-12 MED ORDER — ASPIRIN 81 MG PO TBEC
81.0000 mg | DELAYED_RELEASE_TABLET | Freq: Every day | ORAL | 0 refills | Status: DC
  Filled 2023-11-12: qty 90, 90d supply, fill #0

## 2023-11-12 MED ORDER — CYCLOBENZAPRINE HCL 10 MG PO TABS
10.0000 mg | ORAL_TABLET | Freq: Three times a day (TID) | ORAL | 0 refills | Status: DC | PRN
  Filled 2023-11-12: qty 30, 10d supply, fill #0

## 2023-11-13 ENCOUNTER — Other Ambulatory Visit (HOSPITAL_BASED_OUTPATIENT_CLINIC_OR_DEPARTMENT_OTHER): Payer: Self-pay

## 2023-11-16 ENCOUNTER — Ambulatory Visit: Payer: Self-pay | Admitting: Cardiology

## 2023-11-16 ENCOUNTER — Other Ambulatory Visit (HOSPITAL_BASED_OUTPATIENT_CLINIC_OR_DEPARTMENT_OTHER): Payer: Self-pay

## 2023-11-18 ENCOUNTER — Other Ambulatory Visit (HOSPITAL_BASED_OUTPATIENT_CLINIC_OR_DEPARTMENT_OTHER): Payer: Self-pay

## 2023-11-20 ENCOUNTER — Other Ambulatory Visit (HOSPITAL_BASED_OUTPATIENT_CLINIC_OR_DEPARTMENT_OTHER): Payer: Self-pay

## 2023-11-30 ENCOUNTER — Other Ambulatory Visit (HOSPITAL_BASED_OUTPATIENT_CLINIC_OR_DEPARTMENT_OTHER): Payer: Self-pay

## 2023-12-07 ENCOUNTER — Encounter (HOSPITAL_COMMUNITY): Payer: Self-pay

## 2023-12-07 ENCOUNTER — Other Ambulatory Visit (HOSPITAL_BASED_OUTPATIENT_CLINIC_OR_DEPARTMENT_OTHER): Payer: Self-pay

## 2023-12-08 ENCOUNTER — Encounter (HOSPITAL_BASED_OUTPATIENT_CLINIC_OR_DEPARTMENT_OTHER): Payer: Self-pay

## 2023-12-08 ENCOUNTER — Ambulatory Visit (HOSPITAL_BASED_OUTPATIENT_CLINIC_OR_DEPARTMENT_OTHER)
Admission: RE | Admit: 2023-12-08 | Discharge: 2023-12-08 | Disposition: A | Discharge status: Home / Self Care | Payer: MEDICAID | Source: Ambulatory Visit | Attending: Cardiology | Admitting: Cardiology

## 2023-12-08 DIAGNOSIS — E785 Hyperlipidemia, unspecified: Secondary | ICD-10-CM | POA: Diagnosis present

## 2023-12-08 DIAGNOSIS — G720 Drug-induced myopathy: Secondary | ICD-10-CM | POA: Diagnosis present

## 2023-12-08 DIAGNOSIS — I25118 Atherosclerotic heart disease of native coronary artery with other forms of angina pectoris: Secondary | ICD-10-CM | POA: Diagnosis present

## 2023-12-08 DIAGNOSIS — R931 Abnormal findings on diagnostic imaging of heart and coronary circulation: Secondary | ICD-10-CM | POA: Insufficient documentation

## 2023-12-08 DIAGNOSIS — I10 Essential (primary) hypertension: Secondary | ICD-10-CM | POA: Diagnosis present

## 2023-12-08 DIAGNOSIS — T466X5A Adverse effect of antihyperlipidemic and antiarteriosclerotic drugs, initial encounter: Secondary | ICD-10-CM | POA: Diagnosis present

## 2023-12-08 MED ORDER — NITROGLYCERIN 0.4 MG SL SUBL
0.8000 mg | SUBLINGUAL_TABLET | Freq: Once | SUBLINGUAL | Status: AC
  Administered 2023-12-08: 0.8 mg via SUBLINGUAL

## 2023-12-08 MED ORDER — IOHEXOL 350 MG/ML SOLN
100.0000 mL | Freq: Once | INTRAVENOUS | Status: AC | PRN
  Administered 2023-12-08: 95 mL via INTRAVENOUS

## 2023-12-08 MED ORDER — DILTIAZEM HCL 25 MG/5ML IV SOLN
10.0000 mg | INTRAVENOUS | Status: DC | PRN
Start: 2023-12-08 — End: 2023-12-09

## 2023-12-08 MED ORDER — METOPROLOL TARTRATE 5 MG/5ML IV SOLN: 10.0000 mg | Freq: Once | INTRAVENOUS | Status: DC | PRN

## 2023-12-09 ENCOUNTER — Other Ambulatory Visit (HOSPITAL_BASED_OUTPATIENT_CLINIC_OR_DEPARTMENT_OTHER): Payer: Self-pay

## 2023-12-09 ENCOUNTER — Ambulatory Visit (HOSPITAL_BASED_OUTPATIENT_CLINIC_OR_DEPARTMENT_OTHER)
Admission: RE | Admit: 2023-12-09 | Discharge: 2023-12-09 | Disposition: A | Discharge status: Home / Self Care | Payer: MEDICAID | Source: Ambulatory Visit | Attending: Cardiology | Admitting: Cardiology

## 2023-12-09 ENCOUNTER — Other Ambulatory Visit: Payer: Self-pay | Admitting: Cardiology

## 2023-12-09 DIAGNOSIS — R931 Abnormal findings on diagnostic imaging of heart and coronary circulation: Secondary | ICD-10-CM

## 2023-12-09 MED ORDER — OXYCODONE HCL 10 MG PO TABS
10.0000 mg | ORAL_TABLET | Freq: Four times a day (QID) | ORAL | 0 refills | Status: DC
  Filled 2023-12-09: qty 120, 30d supply, fill #0

## 2023-12-09 NOTE — Progress Notes (Signed)
 FFR Order

## 2023-12-17 ENCOUNTER — Ambulatory Visit: Payer: MEDICAID | Attending: Cardiology

## 2023-12-17 DIAGNOSIS — G720 Drug-induced myopathy: Secondary | ICD-10-CM | POA: Diagnosis present

## 2023-12-17 DIAGNOSIS — I10 Essential (primary) hypertension: Secondary | ICD-10-CM | POA: Insufficient documentation

## 2023-12-17 DIAGNOSIS — I25118 Atherosclerotic heart disease of native coronary artery with other forms of angina pectoris: Secondary | ICD-10-CM | POA: Diagnosis present

## 2023-12-17 DIAGNOSIS — T466X5A Adverse effect of antihyperlipidemic and antiarteriosclerotic drugs, initial encounter: Secondary | ICD-10-CM | POA: Diagnosis present

## 2023-12-17 DIAGNOSIS — E785 Hyperlipidemia, unspecified: Secondary | ICD-10-CM | POA: Diagnosis present

## 2023-12-17 LAB — ECHOCARDIOGRAM COMPLETE
Area-P 1/2: 4.02 cm2
S' Lateral: 3 cm

## 2023-12-18 ENCOUNTER — Ambulatory Visit: Payer: Self-pay | Admitting: Cardiology

## 2023-12-21 ENCOUNTER — Other Ambulatory Visit (HOSPITAL_BASED_OUTPATIENT_CLINIC_OR_DEPARTMENT_OTHER): Payer: Self-pay

## 2023-12-27 ENCOUNTER — Other Ambulatory Visit (HOSPITAL_BASED_OUTPATIENT_CLINIC_OR_DEPARTMENT_OTHER): Payer: Self-pay

## 2023-12-28 ENCOUNTER — Ambulatory Visit: Payer: Self-pay

## 2023-12-28 ENCOUNTER — Other Ambulatory Visit (HOSPITAL_BASED_OUTPATIENT_CLINIC_OR_DEPARTMENT_OTHER): Payer: Self-pay

## 2023-12-28 NOTE — Progress Notes (Deleted)
 Ellouise Console, PA-C [address] [place], [place]  [address] Phone: [phone]   Gastroenterology Consultation  Referring Provider:     Iven Lang ONEIDA RIGGERS Primary Care Physician:  Iven Lang ONEIDA RIGGERS Primary Gastroenterologist:  Ellouise Console, PA-C / *** Reason for Consultation:     IBS, GERD, Barrett's        HPI:   [name] is a 58 y.o. y/o male referred for consultation & management  by Rothfuss, Lang ONEIDA, PA-C.    Here for follow-up of IBS, GERD, and Barrett's esophagus.  03/2015 last colonoscopy by Dr. Avram: Normal.  10-year repeat (due 03/2025).  Biopsies negative for microscopic colitis or IBD.  03/2015 last EGD by Dr. Avram: Distal esophagitis and Barrett's esophagus.  Biopsies negative for dysplasia.  12/2023 normal echocardiogram LVEF 55 to 60%.  Past Medical History:  Diagnosis Date   Accelerated hypertension 08/23/2016   pt denies this   AKI (acute kidney injury) (HCC) 08/23/2016   Angina, class III (HCC) 08/18/2015   Anxiety    Anxiety state 10/28/2006   Qualifier: Diagnosis of   By: Antonio ROSALEA Rockers      IMO SNOMED Dx Update Oct 2024     Arthritis    Barrett's esophagus with dysplasia 10/27/2023   Bipolar affective disorder (HCC)    Bipolar I disorder, most recent episode (or current) manic (HCC) 10/28/2006   >>OVERVIEW FOR DISORDER, BIPOLAR NOS WRITTEN ON 07/10/2010  5:22 PM BY INTERFACE, PROBLEM LIST IN     Qualifier: Diagnosis of   By: Antonio ROSALEA Rockers         Chronic back pain    Chronic kidney disease 2017   AKF - due to Rhabdomyolysis   Chronic kidney disease 2017   AKF - due to Rhabdomyolysis   Chronic nausea 10/27/2023   Cocaine use disorder, mild, abuse (HCC) 12/26/2015   Coronary artery disease    minimal Mid LAD to Dist LAD lesion, 35% stenosed. Prox Cx lesion, 40% stenosed.   Degenerative disc disease, lumbar 09/07/2013   DJD (degenerative joint disease)    Drug overdose, multiple drugs 08/23/2016   Elevated liver  function tests    Family history of premature coronary artery disease 08/18/2015   Fibromyalgia    GERD 10/28/2006   Qualifier: Diagnosis of   By: Antonio ROSALEA Rockers         Head injury    Head injury, closed, with concussion    x 3  from falls   History of rhabdomyolysis 12/26/2015   History of suicidal ideation 10/02/2013   2010, 2012     Hx of renal failure 12/26/2015   Hypercalcemia 10/27/2023   Hyperlipidemia    Hypothyroidism    Inguinal hernia    left   Insomnia    Irritable bowel syndrome 08/09/2008   Qualifier: Diagnosis of   By: Avram MD, NOLIA Pitts E        Leg pain 02/24/2011   Liver injury 12/26/2015   Multiple falls 10/02/2013   Myofascial pain syndrome    Neck pain 02/24/2011   Osteopenia    PAIN, CHRONIC NEC 10/28/2006   Qualifier: Diagnosis of   By: Antonio DO, Yvonne         Postural imbalance with levoscoliosis, h/o 10/02/2013   Primary hypertension 08/23/2016   Primary localized osteoarthritis of right hip 03/15/2019   Primary osteoarthritis of left hip 03/05/2021   Primary osteoarthritis of right hip 03/15/2019   Pseudoarthrosis  of cervical spine (HCC) 12/23/2017   Rhabdomyolysis 08/23/2016   Right hip pain 02/24/2011   Spinal cord stimulator, status-post 10/02/2013   Per Dr. Carles 2010     Spinal stenosis of cervical region 09/21/2017   Statin myopathy 10/27/2023   Tachycardia 08/23/2016   Tibial plateau fracture, left    Tobacco use disorder, continuous 08/18/2015   Transaminitis 08/23/2016   Tremor 09/15/2023   Tremors of nervous system     Past Surgical History:  Procedure Laterality Date   ANTERIOR CERVICAL DECOMP/DISCECTOMY FUSION N/A 09/21/2017   Procedure: ANTERIOR CERVICAL DECOMPRESSION/DISCECTOMY FUSION - CERVICAL THREE-CERVICAL FOUR;  Surgeon: Onetha Kuba, MD;  Location: [facility] OR;  Service: Neurosurgery;  Laterality: N/A;   ANTERIOR CERVICAL DECOMP/DISCECTOMY FUSION N/A 12/23/2017   Procedure: REMOVAL AND REPLACEMENT OF CERVICAL  THREE-FOUR HARDWARE;  Surgeon: Onetha Kuba, MD;  Location[facility] OR;  Service: Neurosurgery;  Laterality: N/A;   CARDIAC CATHETERIZATION N/A 08/31/2015   Procedure: Left Heart Cath and Coronary Angiography;  Surgeon: Alm LELON Clay, MD;  Location: [facility] INVASIVE CV LAB;  Service: Cardiovascular;  Laterality: N/A;   Carotid Dopplers Bilateral 02/20/2015   [facility]: Mild, less than 39% left and right internal carotid artery stenosis. No significant plaque burden   CERVICAL DISC SURGERY     C5-7   COLONOSCOPY     COLONOSCOPY     ESOPHAGOGASTRODUODENOSCOPY     FASCIOTOMY Left 10/20/2013   Procedure: LEFT leg ANTERIOR COMPARTMENT FACSCIOTOMY;  Surgeon: Ozell VEAR Bruch, MD;  Location: [facility] OR;  Service: Orthopedics;  Laterality: Left;   INGUINAL HERNIA REPAIR Left    Nuclear Stress Test  03/06/2015   [facility]: Normal EKG. Low normal EF (49%) normal regional wall motion. No evidence of ischemia or infarction.   ORIF TIBIA PLATEAU Left 10/20/2013   Procedure: OPEN REDUCTION INTERNAL FIXATION (ORIF) LEFT TIBIAL PLATEAU;  Surgeon: Ozell VEAR Bruch, MD;  Location: [facility] OR;  Service: Orthopedics;  Laterality: Left;   prostate implant  10/20/2023   SEPTOPLASTY  2010   SPINAL CORD STIMULATOR BATTERY EXCHANGE N/A 02/08/2016   Procedure: Lumbar spinal cord stimulator implantable pulse generator replacement;  Surgeon: Deward Fabian, MD;  Location: [facility] NEURO ORS;  Service: Neurosurgery;  Laterality: N/A;   SPINAL CORD STIMULATOR BATTERY EXCHANGE N/A 12/23/2017   Procedure: SPINAL CORD STIMULATOR BATTERY EXCHANGE;  Surgeon: Fabian Deward, MD;  Location: [facility] OR;  Service: Neurosurgery;  Laterality: N/A;   SPINAL CORD STIMULATOR IMPLANT     TOTAL HIP ARTHROPLASTY Right 03/15/2019   Procedure: RIGHT TOTAL HIP ARTHROPLASTY ANTERIOR APPROACH;  Surgeon: Sheril Coy, MD;  Location: [facility] ORS;  Service: Orthopedics;  Laterality: Right;   TOTAL HIP ARTHROPLASTY Left 03/05/2021   Procedure: LEFT TOTAL  HIP ARTHROPLASTY ANTERIOR APPROACH;  Surgeon: Sheril Coy, MD;  Location: [facility] ORS;  Service: Orthopedics;  Laterality: Left;   TRANSTHORACIC ECHOCARDIOGRAM  02/20/2015   [facility]: Mild concentric LVH. EF 55-60%. Normal regional wall motion. Mild to moderate TR with no significant pulmonary hypertension.    Prior to Admission medications   Medication Sig Start Date End Date Taking? Authorizing Provider  acetaminophen  (TYLENOL ) 325 MG tablet Take 2 tablets (650 mg total) by mouth every 4 (four) hours as needed for mild pain (1-3) or moderate pain (4-6). Patient taking differently: Take 325 mg by mouth every 12 (twelve) hours. 10/20/23     amantadine  (SYMMETREL ) 100 MG capsule Take 1 capsule (100 mg total) by mouth 2 (two) times daily. 10/08/23  aspirin  EC (ASPIRIN  81) 81 MG tablet Take 1 tablet (81 mg total) by mouth daily. 10/06/23     aspirin  EC (ASPIRIN  81) 81 MG tablet Take 1 tablet (81 mg total) by mouth daily. Can resume 5 days after surgery 11/12/23     busPIRone  (BUSPAR ) 30 MG tablet Take 30 mg by mouth 3 (three) times daily.    [provider]  chlorthalidone  (HYGROTON ) 25 MG tablet Take 1 tablet (25 mg total) by mouth daily. 09/15/23   Rothfuss, Jacob T, PA-C  diclofenac  Sodium (VOLTAREN ) 1 % GEL Apply 2 - 4 grams to painful joints up to 4 times daily if needed. 09/15/23   Rothfuss, Jacob T, PA-C  dicyclomine  (BENTYL ) 10 MG capsule Take 1 capsule (10 mg total) by mouth every 8 (eight) hours as needed. 10/05/23     docusate sodium  (COLACE) 100 MG capsule Take 1 capsule (100 mg total) by mouth 2 (two) times daily as needed for constipation. Patient taking differently: Take 100 mg by mouth 3 (three) times daily. 10/20/23     DULoxetine  HCl 40 MG CPEP Take 1 capsule (40 mg total) by mouth daily. 03/27/23     ergocalciferol  (VITAMIN D2) 1.25 MG (50000 UT) capsule Take 1 capsule (50,000 Units total) by mouth once a week. 10/05/23     esomeprazole  (NEXIUM ) 40 MG capsule Take 1  capsule (40 mg total) by mouth daily. 10/05/23     Evolocumab  (REPATHA  SURECLICK) 140 MG/ML SOAJ Inject 140 mg into the skin every 14 (fourteen) days. 09/15/23   Rothfuss, Jacob T, PA-C  famotidine  (PEPCID ) 20 MG tablet Take 1 tablet (20 mg total) by mouth 2 (two) times daily. 10/05/23     hydrOXYzine  (ATARAX ) 25 MG tablet Take 1 tablet (25 mg total) by mouth 3 (three) times daily. 04/03/22     isosorbide  mononitrate (IMDUR ) 30 MG 24 hr tablet Take 1 tablet (30 mg total) by mouth daily. 11/11/23   Krasowski, Robert J, MD  levothyroxine  (SYNTHROID ) 50 MCG tablet Take 1 tablet (50 mcg total) by mouth daily. 10/05/23     METAMUCIL FIBER PO Take by mouth. 2 tsp daily    [provider]  methocarbamol  (ROBAXIN ) 750 MG tablet Take 1 tablet (750 mg total) by mouth 4 (four) times daily as needed. 09/15/23   Rothfuss, Jacob T, PA-C  metoprolol  tartrate (LOPRESSOR ) 100 MG tablet Take 1 tablet (100 mg total) by mouth once for 1 dose. Please take this medication 2 hours before CT 11/11/23 11/13/23  Bernie Lamar PARAS, MD  Multiple Vitamins-Minerals (CERTAVITE/ANTIOXIDANTS) TABS Take 1 tablet by mouth daily. 12/11/22     naloxone  (NARCAN ) nasal spray 4 mg/0.1 mL Use 1 spray in nose as directed 04/17/21     OLANZapine  (ZYPREXA ) 20 MG tablet Take 20 mg by mouth at bedtime.    [provider]  ondansetron  (ZOFRAN ) 4 MG tablet Take 1 tablet (4 mg total) by mouth 4 (four) times daily as needed. 10/27/23   Rothfuss, Jacob T, PA-C  oxyCODONE  (OXY IR/ROXICODONE ) 5 MG immediate release tablet Take 1 tablet (5 mg total) by mouth every 4 (four) hours as needed for moderate pain (4-6). 11/12/23     Oxycodone  HCl 10 MG TABS Take 1 tablet (10 mg total) by mouth 4 (four) times daily. 12/09/23     traZODone  (DESYREL ) 100 MG tablet Take 100 mg by mouth at bedtime.    [provider]  Doxepin  HCl 3 MG TABS Take 1 tablet by mouth every evening 30  minutes before bedtime on an empty stomach Patient not taking: No sig  reported 05/23/21 06/06/21    loxapine  (LOXITANE ) 25 MG capsule Take 1 capsule (25 mg total) by mouth 2 (two) times daily. Patient not taking: Reported on 10/27/2023 04/03/22 04/10/22    QUEtiapine  (SEROQUEL  XR) 50 MG TB24 24 hr tablet Taper off: Take 2 tablets (100 mg total) by mouth every evening for 14 days, THEN 1 tablet (50 mg total) every evening for 14 days then stop 09/24/23 11/03/23      Family History  Problem Relation Age of Onset   Heart disease Mother    Hypertension Mother    Sudden death Mother        Presumably cardiac   Hyperlipidemia Father    Heart attack Father 40       At least 5 MIs. Had CABG.   Heart failure Father    Diabetes Father    Hypertension Father    Kidney disease Father    Diabetes Brother    Lung cancer Maternal Grandmother    Diabetes Paternal Grandmother    Sudden death Paternal Grandmother        Unclear etiology   Diabetes Other    Kidney disease Other    Epilepsy Daughter    Colon cancer Neg Hx    Mental illness Neg Hx      Social History   Tobacco Use   Smoking status: Every Day    Current packs/day: 1.00    Average packs/day: 1 pack/day for 40.6 years (40.6 ttl pk-yrs)    Types: Cigarettes    Start date: 1985    Passive exposure: Current   Smokeless tobacco: Never  Vaping Use   Vaping status: Never Used  Substance Use Topics   Alcohol use: No    Alcohol/week: 0.0 standard drinks of alcohol    Comment: none since 1999- heavy drinker in past   Drug use: Not Currently    Types: Oxycodone , Cocaine    Comment: rarely- last 2015-    Allergies as of 12/29/2023 - Review Complete 12/08/2023  Allergen Reaction Noted   Zolpidem  tartrate Other (See Comments)    Lyrica [pregabalin] Other (See Comments) 10/27/2016   Flomax [tamsulosin]  09/15/2023   Statins Other (See Comments) 09/15/2023   Demerol [meperidine] Itching, Rash, and Hives 12/25/2012    Review of Systems:    All systems reviewed and negative except where noted in HPI.    Physical Exam:  There were no vitals taken for this visit. No LMP for male patient.  General:   Alert,  Well-developed, well-nourished, pleasant and cooperative in NAD Lungs:  Respirations even and unlabored.  Clear throughout to auscultation.   No wheezes, crackles, or rhonchi. No acute distress. Heart:  Regular rate and rhythm; no murmurs, clicks, rubs, or gallops. Abdomen:  Normal bowel sounds.  No bruits.  Soft, and non-distended without masses, hepatosplenomegaly or hernias noted.  No Tenderness.  No guarding or rebound tenderness.    Neurologic:  Alert and oriented x3;  grossly normal neurologically. Psych:  Alert and cooperative. Normal mood and affect.  Imaging Studies: ECHOCARDIOGRAM COMPLETE Result Date: 12/17/2023    ECHOCARDIOGRAM REPORT   Patient Name:   [name] Date of Exam: 12/17/2023 Medical Rec #:  [id number]      Height:       71.0 in Accession #:    [id number]     Weight:       214.0 lb Date of Birth:  25-Aug-1965      BSA:          2.170 m Patient Age:    57 years       BP:           113/68 mmHg Patient Gender: M              HR:           92 bpm. Exam Location:  [place] Procedure: 2D Echo, Cardiac Doppler, Color Doppler and Strain Analysis (Both            Spectral and Color Flow Doppler were utilized during procedure). Indications:    Primary hypertension [I10 (ICD-10-CM)]; Coronary artery disease                 of native artery of native heart with stable angina pectoris                 (HCC) [I25.118 (ICD-10-CM)]; Statin myopathy [G72.0, T46.6X5A                 (ICD-10-CM)]; Dyslipidemia [E78.5 (ICD-10-CM)]  History:        Patient has prior history of Echocardiogram examinations, most                 recent 11/21/2022. Cardiomyopathy, CAD, Signs/Symptoms:Dyspnea;                 Risk Factors:Current Smoker, Dyslipidemia and Hypertension.  Sonographer:    Charlie Jointer RDCS Referring Phys: [id number] LAMAR JINNY FITCH  Sonographer Comments: Technically difficult study due  to poor echo windows, suboptimal subcostal window and suboptimal parasternal window. IMPRESSIONS  1. Left ventricular ejection fraction, by estimation, is 55 to 60%. The left ventricle has normal function. The left ventricle has no regional wall motion abnormalities. Left ventricular diastolic parameters are consistent with Grade I diastolic dysfunction (impaired relaxation). The average left ventricular global longitudinal strain is -12.4 %. The global longitudinal strain is abnormal.  2. Right ventricular systolic function is normal. The right ventricular size is normal. There is normal pulmonary artery systolic pressure.  3. The mitral valve is normal in structure. No evidence of mitral valve regurgitation. No evidence of mitral stenosis.  4. The aortic valve is normal in structure. Aortic valve regurgitation is not visualized. No aortic stenosis is present.  5. The inferior vena cava is normal in size with greater than 50% respiratory variability, suggesting right atrial pressure of 3 mmHg. FINDINGS  Left Ventricle: Left ventricular ejection fraction, by estimation, is 55 to 60%. The left ventricle has normal function. The left ventricle has no regional wall motion abnormalities. The average left ventricular global longitudinal strain is -12.4 %. Strain was performed and the global longitudinal strain is abnormal. The left ventricular internal cavity size was normal in size. There is no left ventricular hypertrophy. Left ventricular diastolic parameters are consistent with Grade I diastolic dysfunction (impaired relaxation). Right Ventricle: The right ventricular size is normal. No increase in right ventricular wall thickness. Right ventricular systolic function is normal. There is normal pulmonary artery systolic pressure. The tricuspid regurgitant velocity is 1.83 m/s, and  with an assumed right atrial pressure of 3 mmHg, the estimated right ventricular systolic pressure is 16.4 mmHg. Left Atrium: Left atrial  size was normal in size. Right Atrium: Right atrial size was normal in size. Pericardium: There is no evidence of pericardial effusion. Mitral Valve: The mitral valve is normal in structure. No evidence of mitral valve regurgitation. No evidence of  mitral valve stenosis. Tricuspid Valve: The tricuspid valve is normal in structure. Tricuspid valve regurgitation is not demonstrated. No evidence of tricuspid stenosis. Aortic Valve: The aortic valve is normal in structure. Aortic valve regurgitation is not visualized. No aortic stenosis is present. Pulmonic Valve: The pulmonic valve was normal in structure. Pulmonic valve regurgitation is not visualized. No evidence of pulmonic stenosis. Aorta: The aortic root is normal in size and structure. Venous: The inferior vena cava is normal in size with greater than 50% respiratory variability, suggesting right atrial pressure of 3 mmHg. IAS/Shunts: No atrial level shunt detected by color flow Doppler.  LEFT VENTRICLE PLAX 2D LVIDd:         3.50 cm   Diastology LVIDs:         3.00 cm   LV e' medial:    6.85 cm/s LV PW:         1.00 cm   LV E/e' medial:  5.8 LV IVS:        1.30 cm   LV e' lateral:   5.98 cm/s LVOT diam:     2.10 cm   LV E/e' lateral: 6.6 LV SV:         46 LV SV Index:   21        2D Longitudinal Strain LVOT Area:     3.46 cm  2D Strain GLS Avg:     -12.4 %  RIGHT VENTRICLE            IVC RV Basal diam:  2.50 cm    IVC diam: 1.60 cm RV Mid diam:    2.40 cm RV S prime:     8.43 cm/s TAPSE (M-mode): 1.2 cm LEFT ATRIUM             Index        RIGHT ATRIUM          Index LA diam:        3.20 cm 1.47 cm/m   RA Area:     9.27 cm LA Vol (A2C):   28.0 ml 12.90 ml/m  RA Volume:   15.10 ml 6.96 ml/m LA Vol (A4C):   19.1 ml 8.80 ml/m LA Biplane Vol: 24.5 ml 11.29 ml/m  AORTIC VALVE LVOT Vmax:   90.90 cm/s LVOT Vmean:  55.050 cm/s LVOT VTI:    0.132 m  AORTA Ao Root diam: 3.40 cm MITRAL VALVE               TRICUSPID VALVE MV Area (PHT): 4.02 cm    TR Peak grad:    13.4 mmHg MV Decel Time: 189 msec    TR Vmax:        183.00 cm/s MV E velocity: 39.60 cm/s MV A velocity: 75.20 cm/s  SHUNTS MV E/A ratio:  0.53        Systemic VTI:  0.13 m                            Systemic Diam: 2.10 cm Jennifer Crape MD Electronically signed by Jennifer Crape MD Signature Date/Time: 12/17/2023/6:19:33 PM    Final    CT CORONARY MORPH W/CTA COR W/SCORE DEL W/CM &/OR WO/CM Addendum Date: 12/09/2023 ADDENDUM REPORT: 12/09/2023 12:37 ADDENDUM: The following report is an over-read performed by radiologist Dr. Reyes Holder of [facility] on December 09, 2023. This over-read does not include interpretation of cardiac or coronary anatomy or pathology. The coronary calcium  score/coronary  CTA interpretation by the cardiologist is attached. COMPARISON:  None. FINDINGS: Vascular: No acute non-cardiac vascular finding. Mediastinum/Nodes: No pathologically enlarged mediastinal, or hilar lymph nodes, noting limited sensitivity for the detection of hilar adenopathy on this noncontrast study. Symmetric esophageal wall thickening. Lungs/Pleura: Scattered atelectasis/scarring. Upper Abdomen: Visualized portions of the upper abdomen are unremarkable. Musculoskeletal: Spinal stimulating lead in the spinal canal. IMPRESSION: Symmetric esophageal wall thickening, which may reflect esophagitis. Electronically Signed   By: Reyes Holder M.D.   On: 12/09/2023 12:37   Result Date: 12/09/2023 CLINICAL DATA:  CP EXAM: Cardiac/Coronary  CTA TECHNIQUE: The patient was scanned on a GE Apex scanner. FINDINGS: A 120 kV prospective scan was triggered in the descending thoracic aorta at 111 HU's. Axial non-contrast 3 mm slices were carried out through the heart. The data set was analyzed on a dedicated work station and scored using the Agatson method. Gantry rotation speed was 250 msecs and collimation was .6 mm. No beta blockade and 0.8 mg of sl NTG was given. The 3D data set was reconstructed at 75% of the R-R  cycle. Diastolic phases were analyzed on a dedicated work station using MPR, MIP and VRT modes. The patient received 80 cc of contrast. Aorta: Normal size.  No calcifications.  No dissection. Aortic Valve:  Trileaflet.  No calcifications. Coronary Arteries:  Normal coronary origin.  Right dominance. RCA is a large dominant artery that gives rise to PDA and PLA. There is mixed plaque noted in the proximal portion of PDA with moderate stenosis of 50-69%. Left main is a large, short artery that gives rise to LAD and LCX arteries. LAD is a large vessel that has calcified plaque in its proximal portion with mild stenosis of 25-49%. In the mid portion of this artery there are numerous mixed plaques with mild stenosis of 25-49%. This artery gives rise to small D1, moderate size D2.D2 has mixed plaque in its proximal portion with moderate stenosis of 50-69%. LCX is a non-dominant artery that gives rise to one large OM1 branch. There is mixed plaque in the proximal portion of this artery with moderate stenosis of 50-69%. Other findings: Normal pulmonary vein drainage into the left atrium. Normal left atrial appendage without a thrombus. Normal size of the pulmonary artery. IMPRESSION: 1. Coronary calcium  score of 0547. This was 39 percentile for age and sex matched control. 2. Normal coronary origin with right dominance. 3. CAD-RADS 3. Moderate stenosis. (50-69%)2, prox CX, PDA. Consider symptom-guided anti-ischemic pharmacotherapy as well as risk factor modification per guideline directed care. Additional analysis with CT FFR will be submitted. 4. Plaque analysis: TPV 686 mm3, 79th percentile, calcified plaque 122 mm3, non calcified plaque 564 mm3. Severe TPV. Electronically Signed: By: Lamar Fitch M.D. On: 12/09/2023 12:11   CT CORONARY FRACTIONAL FLOW RESERVE FLUID ANALYSIS Result Date: 12/09/2023 EXAM: FFRCT ANALYSIS FINDINGS: FFRct analysis was performed on the original cardiac CT angiogram dataset. Diagrammatic  representation of the FFRct analysis is provided in a separate PDF document in PACS. This dictation was created using the PDF document and an interactive 3D model of the results. 3D model is not available in the EMR/PACS. Normal FFR range is >0.80. 1. LM FFR: 1.00 2. LAD FFR: Prox 0.98, mid 0.93, distal 0.84 3. D2 FFR: Prox 0.93 4. CX FFR: Prox 0.99, mid 0.92, distal 0.9 5. RCA FFR: Prox 0.99, mid 0.95, distal 0.95, 6. PDA FFR: Prox 0.95 7. IMPRESSION: This study demonstrates LOW likelihood of hemodynamically significant stenosis. Electronically Signed  By: Lamar Fitch M.D.   On: 12/09/2023 12:33    Labs: CBC    Component Value Date/Time   WBC 10.2 09/17/2023 1615   WBC 9.5 05/03/2022 1815   RBC 5.40 09/17/2023 1615   RBC 4.47 05/03/2022 1815   HGB 16.7 09/17/2023 1615   HCT 48.3 09/17/2023 1615   PLT 375 09/17/2023 1615   MCV 89 09/17/2023 1615   MCH 30.9 09/17/2023 1615   MCH 29.8 05/03/2022 1815   MCHC 34.6 09/17/2023 1615   MCHC 33.3 05/03/2022 1815   RDW 14.1 09/17/2023 1615   LYMPHSABS 2.5 09/17/2023 1615   MONOABS 1.0 05/03/2022 1815   EOSABS 0.1 09/17/2023 1615   BASOSABS 0.1 09/17/2023 1615    CMP     Component Value Date/Time   NA 137 11/11/2023 0853   K 4.0 11/11/2023 0853   CL 97 11/11/2023 0853   CO2 22 11/11/2023 0853   GLUCOSE 97 11/11/2023 0853   GLUCOSE 108 (H) 05/03/2022 1815   BUN 11 11/11/2023 0853   CREATININE 1.24 11/11/2023 0853   CREATININE 1.01 08/30/2015 1413   CALCIUM  10.4 (H) 11/11/2023 0853   PROT 8.6 (H) 09/17/2023 1615   ALBUMIN 5.5 (H) 09/17/2023 1615   AST 22 09/17/2023 1615   ALT 17 09/17/2023 1615   ALKPHOS 122 (H) 09/17/2023 1615   BILITOT 0.4 09/17/2023 1615   GFRNONAA >60 05/03/2022 1815   GFRAA >60 03/08/2019 1114    Assessment and Plan:   TERRACE FONTANILLA is a 58 y.o. y/o male has been referred for   1.  Barrett's esophagus.  Patient is overdue for repeat surveillance EGD. - Scheduling EGD I discussed risks of EGD  with patient to include risk of bleeding, perforation, and risk of sedation.  Patient expressed understanding and agrees to proceed with EGD.   2.  GERD with esophagitis - Continue PPI  3.  Colon cancer screening - 10-year repeat colonoscopy will be due 03/2025.  Follow up ***  Ellouise Console, PA-C

## 2023-12-28 NOTE — Telephone Encounter (Signed)
 FYI Only or Action Required?: FYI only for provider.  Called Nurse Triage reporting Shortness of Breath.  Symptoms began a week ago.  Interventions attempted: Rest, hydration, or home remedies.  Symptoms are: unchanged.  Triage Disposition: See HCP Within 4 Hours (Or PCP Triage)  Patient/caregiver understands and will follow disposition?: Yes  Symptoms x1 week, appointment scheduled for tomorrow   Copied from CRM #[id number]. Topic: Clinical - Red Word Triage >> Dec 28, 2023 10:24 AM Donee H wrote: [facility] that prompted transfer to Nurse Triage: Patient's wife Leita is calling with concern of patient having chest pains. She stated this has been going on for about a week now. She also stated patient is having moments where he feels like he is going to pass out. She is calling to see if he can get an appointment with his pcp.     Reason for Disposition  [1] MILD difficulty breathing (e.g., minimal/no SOB at rest, SOB with walking, pulse < 100) AND [2] NEW-onset or WORSE than normal    Symptoms x1 week, appointment tomorrow  Answer Assessment - Initial Assessment Questions 1. RESPIRATORY STATUS: Describe your breathing? (e.g., wheezing, shortness of breath, unable to speak, severe coughing)      Shortness of breath with exertion  2. ONSET: When did this breathing problem begin?      1 week 3. PATTERN Does the difficult breathing come and go, or has it been constant since it started?      Intermittent  4. SEVERITY: How bad is your breathing? (e.g., mild, moderate, severe)      Moderate  5. RECURRENT SYMPTOM: Have you had difficulty breathing before? If Yes, ask: When was the last time? and What happened that time?      No 6. CARDIAC HISTORY: Do you have any history of heart disease? (e.g., heart attack, angina, bypass surgery, angioplasty)      No 7. LUNG HISTORY: Do you have any history of lung disease?  (e.g., pulmonary embolus, asthma, emphysema)     No 8.  CAUSE: What do you think is causing the breathing problem?      Unsure  9. OTHER SYMPTOMS: Do you have any other symptoms? (e.g., chest pain, cough, dizziness, fever, runny nose)     Dizziness intermittently  10. O2 SATURATION MONITOR:  Do you use an oxygen saturation monitor (pulse oximeter) at home? If Yes, ask: What is your reading (oxygen level) today? What is your usual oxygen saturation reading? (e.g., 95%)       No  Protocols used: Breathing Difficulty-A-AH

## 2023-12-29 ENCOUNTER — Ambulatory Visit (INDEPENDENT_AMBULATORY_CARE_PROVIDER_SITE_OTHER): Payer: MEDICAID | Admitting: Student

## 2023-12-29 ENCOUNTER — Other Ambulatory Visit (HOSPITAL_BASED_OUTPATIENT_CLINIC_OR_DEPARTMENT_OTHER): Payer: Self-pay

## 2023-12-29 ENCOUNTER — Ambulatory Visit: Payer: MEDICAID | Admitting: Physician Assistant

## 2023-12-29 ENCOUNTER — Encounter (HOSPITAL_BASED_OUTPATIENT_CLINIC_OR_DEPARTMENT_OTHER): Payer: Self-pay | Admitting: Student

## 2023-12-29 ENCOUNTER — Ambulatory Visit (HOSPITAL_BASED_OUTPATIENT_CLINIC_OR_DEPARTMENT_OTHER)
Admission: RE | Admit: 2023-12-29 | Discharge: 2023-12-29 | Disposition: A | Discharge status: Home / Self Care | Payer: MEDICAID | Source: Ambulatory Visit | Attending: Student | Admitting: Radiology

## 2023-12-29 ENCOUNTER — Ambulatory Visit (HOSPITAL_BASED_OUTPATIENT_CLINIC_OR_DEPARTMENT_OTHER): Payer: Self-pay | Admitting: Student

## 2023-12-29 VITALS — BP 123/85 | HR 90 | Temp 99.3°F | Resp 18 | Ht 71.0 in | Wt 201.9 lb

## 2023-12-29 DIAGNOSIS — R0602 Shortness of breath: Secondary | ICD-10-CM

## 2023-12-29 DIAGNOSIS — Z720 Tobacco use: Secondary | ICD-10-CM | POA: Diagnosis not present

## 2023-12-29 DIAGNOSIS — J441 Chronic obstructive pulmonary disease with (acute) exacerbation: Secondary | ICD-10-CM

## 2023-12-29 DIAGNOSIS — I10 Essential (primary) hypertension: Secondary | ICD-10-CM | POA: Diagnosis not present

## 2023-12-29 MED ORDER — ALBUTEROL SULFATE HFA 108 (90 BASE) MCG/ACT IN AERS
2.0000 | INHALATION_SPRAY | Freq: Four times a day (QID) | RESPIRATORY_TRACT | 0 refills | Status: DC | PRN
  Filled 2023-12-29: qty 18, 25d supply, fill #0

## 2023-12-29 MED ORDER — AZITHROMYCIN 250 MG PO TABS
ORAL_TABLET | ORAL | 0 refills | Status: AC
Start: 2023-12-29 — End: 2024-01-05
  Filled 2023-12-29: qty 6, 5d supply, fill #0

## 2023-12-29 MED ORDER — PREDNISONE 20 MG PO TABS
40.0000 mg | ORAL_TABLET | Freq: Every day | ORAL | 0 refills | Status: AC
Start: 2023-12-29 — End: 2024-01-05
  Filled 2023-12-29: qty 10, 5d supply, fill #0

## 2023-12-29 MED ORDER — NICOTINE 21 MG/24HR TD PT24
21.0000 mg | MEDICATED_PATCH | Freq: Every day | TRANSDERMAL | 2 refills | Status: AC
  Filled 2023-12-29: qty 28, 28d supply, fill #0
  Filled 2024-04-10: qty 28, 28d supply, fill #1
  Filled 2024-05-17: qty 28, 28d supply, fill #2

## 2023-12-29 MED ORDER — IPRATROPIUM-ALBUTEROL 0.5-2.5 (3) MG/3ML IN SOLN
3.0000 mL | Freq: Once | RESPIRATORY_TRACT | Status: AC
  Administered 2023-12-29: 3 mL via RESPIRATORY_TRACT

## 2023-12-29 MED ORDER — NICOTINE POLACRILEX 2 MG MT LOZG
2.0000 mg | LOZENGE | OROMUCOSAL | 1 refills | Status: DC | PRN
Start: 2023-12-29 — End: 2024-01-07
  Filled 2023-12-29: qty 72, 7d supply, fill #0

## 2023-12-29 MED ORDER — OLMESARTAN MEDOXOMIL 20 MG PO TABS
20.0000 mg | ORAL_TABLET | Freq: Every day | ORAL | 3 refills | Status: AC
  Filled 2023-12-29: qty 30, 30d supply, fill #0
  Filled 2024-02-27: qty 30, 30d supply, fill #1
  Filled 2024-04-16: qty 30, 30d supply, fill #2
  Filled 2024-05-17: qty 30, 30d supply, fill #3

## 2023-12-29 NOTE — Progress Notes (Signed)
 Acute Office Visit  Subjective:     Patient ID: [name], male    DOB: 1966/04/11, 58 y.o.   MRN: [id number]  Chief Complaint  Patient presents with   Dizziness    Getting SOB & dizzy for 2 weeks now. O2 will be around 99%. Has episodes where he feels like he might faint. Feels like BP med does not agree with him. BP ranging 110-145/70-90. Also feeling like chlorthalidone  is giving him issues with sex. Gets sweats every time episode happens. Gets gassy, has a lot. Always has back pain, neck pain, and hips. Abdomen has been hurting more.  Lost weight.    HPI  Discussed the use of AI scribe software for clinical note transcription with the patient, who gave verbal consent to proceed.  History of Present Illness   [name] is a 58 year old male with emphysema who presents with shortness of breath and dizziness. He is accompanied by Leita, his wife.  He experiences shortness of breath and dizziness after minimal exertion, such as walking down steps to the kitchen. The dizziness is described as 'dizzy spells' with a sensation of the head spinning, accompanied by sweating and a near-fainting feeling. These symptoms have persisted for approximately two weeks.  He has a history of emphysema and has been undergoing annual CT scans for the past four years. He missed a recent CT scan due to transportation issues and has not had any lung function testing done previously. He reports occasionall sweats throughout the day and chills. His blood pressure readings have fluctuated between 145/90 and 120/70, according to his report. No falls or passing out have occurred, but he feels very close to fainting.  He smokes approximately two packs of cigarettes a day and has been smoking since he was 58 years old. He has a history of being hospitalized for four weeks due to aspiration into both lungs, during which a CT scan was performed but no lung function tests were conducted.  He has been on  chlorthalidone  for blood pressure management for two to three months, but reports ED about 2 weeks after starting it. He has not been on any other blood pressure medications prior to this. He also mentions having been prescribed Imdur  but is not currently taking it. He is currently using Repatha  without issues.      ROS Per HPI     Objective:    BP 123/85   Pulse 90   Temp 99.3 F (37.4 C) (Oral)   Resp 18   Ht 5' 11 (1.803 m)   Wt 201 lb 14.4 oz (91.6 kg)   SpO2 99%   BMI 28.16 kg/m    Physical Exam Constitutional:      General: He is not in acute distress.    Appearance: Normal appearance. He is not ill-appearing, toxic-appearing or diaphoretic.  HENT:     Head: Normocephalic and atraumatic.     Right Ear: External ear normal.     Left Ear: External ear normal.     Nose: Nose normal.  Eyes:     Conjunctiva/sclera: Conjunctivae normal.  Cardiovascular:     Rate and Rhythm: Regular rhythm. Tachycardia present.     Pulses: Normal pulses.     Heart sounds: Normal heart sounds. No murmur heard.    No friction rub.  Pulmonary:     Effort: Pulmonary effort is normal. No respiratory distress.     Breath sounds: Normal breath sounds. No wheezing, rhonchi or  rales.  Skin:    General: Skin is warm and dry.     Coloration: Skin is not jaundiced or pale.  Neurological:     Mental Status: He is alert.  Psychiatric:        Mood and Affect: Mood normal.        Behavior: Behavior normal.     No results found for any visits on 12/29/23.      Assessment & Plan:    Assessment and Plan    Shortness of breath and exertional dyspnea suspected COPD exacerbation Intermittent shortness of breath and dizziness with exertion for the past two weeks, accompanied by sweating and near-syncope. Normal heart testing suggests low likelihood of cardiac etiology. Normal oxygen saturation and clear lung sounds. Differential includes pulmonary issues, possibly related to emphysema. Patient  denies EKG today. No CP. ER precautions discussed. - Order chest x-ray to evaluate for underlying pulmonary issues- it was normal - Administer Duoneb treatment in office - Prescribe albuterol  inhaler for as-needed use - Order Z-Pak and prednisone  - Set up for in office spirometry.  Nicotine  dependence, cigarettes Long-standing nicotine  dependence with a history of smoking two packs per day for approximately 45 years. Expresses a desire to quit smoking. Nicotine  replacement therapy discussed, emphasizing the importance of not smoking while using patches and lozenges. - Prescribe nicotine  patches and lozenges for smoking cessation - Instruct on proper use of nicotine  replacement therapy - Advise against smoking while using nicotine  replacement therapy - Provide information on 1-800-QUIT-NOW for additional support  Hypertension Chronic issue with SE of treatment. Hypertension previously managed with chlorthalidone , which may be causing adverse effects. Blood pressure has been variable, with recent readings showing control. Chlorthalidone  to be discontinued due to possible side effects. Olmesartan  chosen as an alternative due to its longer action and different class, reducing the risk of similar side effects. - Discontinue chlorthalidone  - Prescribe olmesartan  20 mg tablets, instruct to start with half a tablet and increase to full tablet if blood pressure is greater than 140/90 - Encourage continuation of Imdur  if available - Schedule follow-up in six weeks to monitor blood pressure and side effects  Return in about 6 weeks (around 02/09/2024), or if symptoms worsen or fail to improve.  Marylynne Keelin T Edynn Gillock, PA-C

## 2023-12-29 NOTE — Patient Instructions (Signed)
 It was nice to see you today!  If you have any problems before your next visit feel free to message me via MyChart (minor issues or questions) or call the office, otherwise you may reach out to schedule an office visit.  Thank you! Pau Banh, PA-C

## 2023-12-30 ENCOUNTER — Encounter (HOSPITAL_BASED_OUTPATIENT_CLINIC_OR_DEPARTMENT_OTHER): Payer: Self-pay

## 2023-12-30 ENCOUNTER — Other Ambulatory Visit (HOSPITAL_BASED_OUTPATIENT_CLINIC_OR_DEPARTMENT_OTHER): Payer: Self-pay

## 2023-12-31 ENCOUNTER — Ambulatory Visit (HOSPITAL_BASED_OUTPATIENT_CLINIC_OR_DEPARTMENT_OTHER): Payer: MEDICAID

## 2023-12-31 ENCOUNTER — Encounter (HOSPITAL_BASED_OUTPATIENT_CLINIC_OR_DEPARTMENT_OTHER): Payer: Self-pay

## 2023-12-31 ENCOUNTER — Other Ambulatory Visit (HOSPITAL_BASED_OUTPATIENT_CLINIC_OR_DEPARTMENT_OTHER): Payer: Self-pay

## 2023-12-31 ENCOUNTER — Ambulatory Visit (INDEPENDENT_AMBULATORY_CARE_PROVIDER_SITE_OTHER): Payer: MEDICAID

## 2023-12-31 DIAGNOSIS — J441 Chronic obstructive pulmonary disease with (acute) exacerbation: Secondary | ICD-10-CM

## 2023-12-31 DIAGNOSIS — R0602 Shortness of breath: Secondary | ICD-10-CM

## 2023-12-31 NOTE — Progress Notes (Signed)
 Patient is in office today for a nurse visit for pt was here to do PFT. Report was given to pt.

## 2024-01-04 ENCOUNTER — Other Ambulatory Visit (HOSPITAL_BASED_OUTPATIENT_CLINIC_OR_DEPARTMENT_OTHER): Payer: Self-pay

## 2024-01-05 ENCOUNTER — Telehealth (HOSPITAL_BASED_OUTPATIENT_CLINIC_OR_DEPARTMENT_OTHER): Payer: Self-pay

## 2024-01-05 NOTE — Telephone Encounter (Signed)
 Communication  Reason for CRM: pt called asking if Jacob Rothfuss will prescribe him the 4mg  nicotine  pills.  He said it is cheaper to get them with medicaid if the provider sends an order to the pharmacy.          He said he uses the [facility]

## 2024-01-05 NOTE — Progress Notes (Signed)
 Pt was seen to do spirometry testing.

## 2024-01-05 NOTE — Telephone Encounter (Signed)
 Spoke to wife per DRP, they have the rx. They had to get them ordered but its ok.

## 2024-01-06 ENCOUNTER — Other Ambulatory Visit (HOSPITAL_BASED_OUTPATIENT_CLINIC_OR_DEPARTMENT_OTHER): Payer: Self-pay | Admitting: Student

## 2024-01-06 ENCOUNTER — Other Ambulatory Visit (HOSPITAL_BASED_OUTPATIENT_CLINIC_OR_DEPARTMENT_OTHER): Payer: Self-pay

## 2024-01-06 ENCOUNTER — Telehealth (HOSPITAL_BASED_OUTPATIENT_CLINIC_OR_DEPARTMENT_OTHER): Payer: Self-pay | Admitting: Student

## 2024-01-06 DIAGNOSIS — Z720 Tobacco use: Secondary | ICD-10-CM

## 2024-01-06 NOTE — Telephone Encounter (Signed)
 Copied from CRM #[id number]. Topic: General - Other >> Jan 05, 2024  3:42 PM Franky GRADE wrote: Reason for CRM: Patient's spouse finished speaking to  [place], [place] . But she has a couple more questions for Carlie if she could get a call back.

## 2024-01-07 ENCOUNTER — Ambulatory Visit (INDEPENDENT_AMBULATORY_CARE_PROVIDER_SITE_OTHER)
Admission: RE | Admit: 2024-01-07 | Discharge: 2024-01-07 | Disposition: A | Discharge status: Home / Self Care | Payer: MEDICAID | Source: Ambulatory Visit | Attending: Physician Assistant | Admitting: Physician Assistant

## 2024-01-07 ENCOUNTER — Other Ambulatory Visit (INDEPENDENT_AMBULATORY_CARE_PROVIDER_SITE_OTHER): Payer: MEDICAID

## 2024-01-07 ENCOUNTER — Ambulatory Visit (INDEPENDENT_AMBULATORY_CARE_PROVIDER_SITE_OTHER): Payer: MEDICAID | Admitting: Physician Assistant

## 2024-01-07 ENCOUNTER — Other Ambulatory Visit (HOSPITAL_BASED_OUTPATIENT_CLINIC_OR_DEPARTMENT_OTHER): Payer: Self-pay | Admitting: Student

## 2024-01-07 ENCOUNTER — Encounter: Payer: Self-pay | Admitting: Physician Assistant

## 2024-01-07 ENCOUNTER — Other Ambulatory Visit (HOSPITAL_BASED_OUTPATIENT_CLINIC_OR_DEPARTMENT_OTHER): Payer: Self-pay

## 2024-01-07 VITALS — BP 106/70 | HR 86 | Ht 71.0 in | Wt 217.0 lb

## 2024-01-07 DIAGNOSIS — K582 Mixed irritable bowel syndrome: Secondary | ICD-10-CM | POA: Diagnosis not present

## 2024-01-07 DIAGNOSIS — Z720 Tobacco use: Secondary | ICD-10-CM

## 2024-01-07 DIAGNOSIS — R1319 Other dysphagia: Secondary | ICD-10-CM

## 2024-01-07 DIAGNOSIS — R110 Nausea: Secondary | ICD-10-CM

## 2024-01-07 DIAGNOSIS — K7581 Nonalcoholic steatohepatitis (NASH): Secondary | ICD-10-CM

## 2024-01-07 DIAGNOSIS — R1310 Dysphagia, unspecified: Secondary | ICD-10-CM

## 2024-01-07 DIAGNOSIS — R143 Flatulence: Secondary | ICD-10-CM

## 2024-01-07 DIAGNOSIS — R140 Abdominal distension (gaseous): Secondary | ICD-10-CM | POA: Diagnosis not present

## 2024-01-07 DIAGNOSIS — K219 Gastro-esophageal reflux disease without esophagitis: Secondary | ICD-10-CM

## 2024-01-07 LAB — CBC WITH DIFFERENTIAL/PLATELET
Basophils Absolute: 0.1 K/uL (ref 0.0–0.1)
Basophils Relative: 1.2 % (ref 0.0–3.0)
Eosinophils Absolute: 0.3 K/uL (ref 0.0–0.7)
Eosinophils Relative: 2.9 % (ref 0.0–5.0)
HCT: 38.8 % — ABNORMAL LOW (ref 39.0–52.0)
Hemoglobin: 13.4 g/dL (ref 13.0–17.0)
Lymphocytes Relative: 27.6 % (ref 12.0–46.0)
Lymphs Abs: 2.7 K/uL (ref 0.7–4.0)
MCHC: 34.6 g/dL (ref 30.0–36.0)
MCV: 89.9 fl (ref 78.0–100.0)
Monocytes Absolute: 0.9 K/uL (ref 0.1–1.0)
Monocytes Relative: 9.1 % (ref 3.0–12.0)
Neutro Abs: 5.9 K/uL (ref 1.4–7.7)
Neutrophils Relative %: 59.2 % (ref 43.0–77.0)
Platelets: 308 K/uL (ref 150.0–400.0)
RBC: 4.32 Mil/uL (ref 4.22–5.81)
RDW: 13.1 % (ref 11.5–15.5)
WBC: 9.9 K/uL (ref 4.0–10.5)

## 2024-01-07 LAB — COMPREHENSIVE METABOLIC PANEL WITH GFR
ALT: 17 U/L (ref 0–53)
AST: 13 U/L (ref 0–37)
Albumin: 3.8 g/dL (ref 3.5–5.2)
Alkaline Phosphatase: 64 U/L (ref 39–117)
BUN: 19 mg/dL (ref 6–23)
CO2: 24 meq/L (ref 19–32)
Calcium: 8.5 mg/dL (ref 8.4–10.5)
Chloride: 103 meq/L (ref 96–112)
Creatinine, Ser: 0.93 mg/dL (ref 0.40–1.50)
GFR: 91.55 mL/min (ref >60.00)
Glucose, Bld: 79 mg/dL (ref 70–99)
Potassium: 3.5 meq/L (ref 3.5–5.1)
Sodium: 139 meq/L (ref 135–145)
Total Bilirubin: 0.2 mg/dL (ref 0.2–1.2)
Total Protein: 6.1 g/dL (ref 6.0–8.3)

## 2024-01-07 MED ORDER — ESOMEPRAZOLE MAGNESIUM 40 MG PO CPDR
40.0000 mg | DELAYED_RELEASE_CAPSULE | Freq: Every day | ORAL | 1 refills | Status: AC
  Filled 2024-01-07 – 2024-03-16 (×2): qty 90, 90d supply, fill #0
  Filled 2024-06-19: qty 90, 90d supply, fill #1

## 2024-01-07 MED ORDER — NICOTINE POLACRILEX 2 MG MT LOZG
2.0000 mg | LOZENGE | OROMUCOSAL | 8 refills | Status: AC | PRN
  Filled 2024-01-07: qty 72, 7d supply, fill #0
  Filled 2024-01-23: qty 72, 7d supply, fill #1
  Filled 2024-04-10: qty 81, 8d supply, fill #2
  Filled 2024-05-17: qty 72, 15d supply, fill #3
  Filled 2024-06-19: qty 72, 15d supply, fill #4

## 2024-01-07 MED ORDER — DICYCLOMINE HCL 10 MG PO CAPS
10.0000 mg | ORAL_CAPSULE | Freq: Three times a day (TID) | ORAL | 1 refills | Status: AC | PRN
  Filled 2024-01-07 – 2024-01-31 (×2): qty 90, 30d supply, fill #0
  Filled 2024-02-27: qty 90, 30d supply, fill #1
  Filled 2024-05-08: qty 90, 30d supply, fill #2
  Filled 2024-06-19: qty 90, 30d supply, fill #3

## 2024-01-07 MED ORDER — METRONIDAZOLE 500 MG PO TABS
250.0000 mg | ORAL_TABLET | Freq: Three times a day (TID) | ORAL | 0 refills | Status: AC
  Filled 2024-01-07: qty 15, 10d supply, fill #0

## 2024-01-07 MED ORDER — FAMOTIDINE 20 MG PO TABS
20.0000 mg | ORAL_TABLET | Freq: Two times a day (BID) | ORAL | 1 refills | Status: AC
  Filled 2024-01-07 – 2024-02-18 (×2): qty 180, 90d supply, fill #0
  Filled 2024-05-18: qty 180, 90d supply, fill #1

## 2024-01-07 MED ORDER — ONDANSETRON HCL 4 MG PO TABS
4.0000 mg | ORAL_TABLET | Freq: Four times a day (QID) | ORAL | 3 refills | Status: AC | PRN
  Filled 2024-01-07: qty 120, 30d supply, fill #0
  Filled 2024-04-10: qty 120, 30d supply, fill #1
  Filled 2024-05-03: qty 120, 30d supply, fill #2

## 2024-01-07 NOTE — Patient Instructions (Addendum)
 Your provider has requested that you go to the basement level for lab work before leaving today. Press B on the elevator. The lab is located at the first door on the left as you exit the elevator.  Your provider has requested that you have an abdominal x ray before leaving today. Please go to the basement floor to our Radiology department for the test.  Please take your proton pump inhibitor medication, nexium   Please take this medication 30 minutes to 1 hour before meals- this makes it more effective.  Avoid spicy and acidic foods Avoid fatty foods Limit your intake of coffee, tea, alcohol, and carbonated drinks Work to maintain a healthy weight Keep the head of the bed elevated at least 3 inches with blocks or a wedge pillow if you are having any nighttime symptoms Stay upright for 2 hours after eating Avoid meals and snacks three to four hours before bedtime  Gastroparesis Gastroparesis is a condition in which food takes longer than normal to empty from the stomach.  This condition is also known as delayed gastric emptying. It is usually a long-term (chronic) condition.  What are the signs or symptoms? Symptoms of this condition include: Feeling full after eating very little or a loss of appetite. Nausea, vomiting, or heartburn. Bloating of your abdomen. Inconsistent blood sugar (glucose) levels on blood tests. Unexplained weight loss. Acid from the stomach coming up into the esophagus (gastroesophageal reflux). Sudden tightening (spasm) of the stomach, which can be painful. Symptoms may come and go. Some people may not notice any symptoms.  What increases the risk? You are more likely to develop this condition if: You have certain disorders or diseases. These may include: An endocrine disorder. An eating disorder. Amyloidosis. Scleroderma. Parkinson's disease. Multiple sclerosis. Cancer or infection of the stomach or the vagus nerve. You have had surgery on your stomach  or vagus nerve. You take certain medicines. You are male.  Things you can do: Please do small frequent meals like 4-6 meals a day.  Eat and drink liquids at separate times.  Avoid high fiber foods, cook your vegetables, avoid high fat food.  Suggest spreading protein throughout the day (greek yogurt, glucerna, soft meat, milk, eggs) Choose soft foods that you can mash with a fork When you are more symptomatic, change to pureed foods foods and liquids.  Consider reading Living well with Gastroparesis by [facility] Check out this link to a diet online https://my.GroupJournal.fr  Small intestinal bacterial overgrowth (SIBO) occurs when there is an abnormal increase in the overall bacterial population in the small intestine -- particularly types of bacteria not commonly found in that part of the digestive tract. Small intestinal bacterial overgrowth (SIBO) commonly results when a circumstance -- such as surgery or disease -- slows the passage of food and waste products in the digestive tract, creating a breeding ground for bacteria.  Signs and symptoms of SIBO often include: Loss of appetite Abdominal pain Nausea Bloating An uncomfortable feeling of fullness after eating Diarrhea or constipation, depending on the type of gas produced  What foods trigger SIBO? While foods aren't the original cause of SIBO, certain foods do encourage the overgrowth of the wrong bacteria in your small intestine. If you're feeding them their favorite foods, they're going to grow more, and that will trigger more of your SIBO symptoms. By the same token, you can help reduce the overgrowth by starving the problematic bacteria of their favorite foods. This strategy has led to a number of  proposed SIBO eating plans. The plans vary, and so do individual results. But in general, they tend to recommend limiting  carbohydrates.  These include: Sugars and sweeteners. Fruits and starchy vegetables. Dairy products. Grains.  There is a test for this we can do called a breath test, if you are positive we will treat you with an antibiotic to see if it helps.  Your symptoms are very suspicious for this condition, as discussed, we will start you on an antibiotic to see if this helps.   Due to recent changes in healthcare laws, you may see the results of your imaging and laboratory studies on MyChart before your provider has had a chance to review them.  We understand that in some cases there may be results that are confusing or concerning to you. Not all laboratory results come back in the same time frame and the provider may be waiting for multiple results in order to interpret others.  Please give us  48 hours in order for your provider to thoroughly review all the results before contacting the office for clarification of your results.   You have been scheduled for an endoscopy. Please follow written instructions given to you at your visit today.  If you use inhalers (even only as needed), please bring them with you on the day of your procedure.  If you take any of the following medications, they will need to be adjusted prior to your procedure:   DO NOT TAKE 7 DAYS PRIOR TO TEST- Trulicity (dulaglutide) Ozempic, Wegovy (semaglutide) Mounjaro (tirzepatide) Bydureon Bcise (exanatide extended release)  DO NOT TAKE 1 DAY PRIOR TO YOUR TEST Rybelsus (semaglutide) Adlyxin (lixisenatide) Victoza (liraglutide) Byetta (exanatide) ___________________________________________________________________________    I appreciate the  opportunity to care for you  Thank You   [facility]

## 2024-01-07 NOTE — Progress Notes (Signed)
 01/07/2024 [name] [id number] 1965/06/17  Referring provider: [name], PA-C Primary GI doctor: Dr. Avram  ASSESSMENT AND PLAN:  IBS- D well controlled on pain meds, may have some constipation and complains most about increasing gas over 5 years 2016 colonoscopy negative microscopic colitis While on pain medication ( 10 mg oxy QID) has firm daily Bm's Dicyclomine  helps, beano, gas x and diet does not help, can be worse if he does not eat anything Chronic bloating and gas, possible constipation versus SIBO due to opioid use slowing motility. - Order abdominal x-ray to evaluate stool burden - Prescribe Flagyl  for suspected SIBO. - Provide dietary information related to SIBO/FODMAP  GERD with dysphagia, states had EGD with [facility] with dilitation and was diagnosed with Barrett's 4-5 years ago per patient symptoms persistent for 2-3 years, previous dilation effective, symptoms returned, dentures may contribute. 03/08/2015 EGD without Barrett's, gastritis Thalia well controlled per patient, on zofran  in AM for nausea - coninue nexium  40 mg and pepcid  - get records from [facility] -Lifestyle changes discussed, avoid NSAIDS, ETOH, hand out given to the patient -Weight loss discussed with the patient -Smoking cessation discussed in detail -Schedule EGD with possible dilation at [facility] to evaluate GERD, esophagitis, hiatal hernia,H pylori. I discussed risks of EGD with patient today, including risk of sedation, bleeding or perforation. Patient provides understanding and gave verbal consent to proceed. -gastroparesis diet given, likely component from chronic narcotics  [facility] 11/19/2020 CTAP W fatty liver with small stable hepatic cyst versus hemangioma, stable simple right renal cyst total right hip replacement otherwise unremarkable 01/25/2015 HIDA normal    Latest Ref Rng & Units 09/17/2023    4:15 PM 07/12/2021    9:16 PM 10/23/2020    9:37 PM  Hepatic Function  Total Protein  6.0 - 8.5 g/dL 8.6  7.6  8.0   Albumin 3.8 - 4.9 g/dL 5.5  4.4  4.3   AST 0 - 40 IU/L 22  20  31    ALT 0 - 44 IU/L 17  22  38   Alk Phosphatase 44 - 121 IU/L 122  78  103   Total Bilirubin 0.0 - 1.2 mg/dL 0.4  <9.8  0.3    Platelets 375  FIB4 0.8, excluded fibrosis - need LFTs and CBC monitored every 6 months, - consider RUQ with elastography -Continue to work on risk factor modification including diet exercise and control of risk factors including blood sugars. - Counseled on ETOH cessation.   Screening colonoscopy 03/08/2015 unremarkable recall 10 years Recall 03/2025  CAD/diastolic dysfunction 12/09/2023 FFR CT analysis low likelihood of hemodynamically significant stenosis Echo 12/17/2023 ejection fraction 55 to 60% grade 1 diastolic no aortic stenosis  Patient Care Team: Rothfuss, Lang DASEN RIGGERS as PCP - General (Physician Assistant)  HISTORY OF PRESENT ILLNESS: 58 y.o. male with a past medical history listed below presents for evaluation of GERD with dysphagia, gas.   Last seen in the office 11/28/2014 by Dr. Avram for abdominal pain nausea vomiting and diarrhea. Has seen [facility] 2-3 years ago and had EGD, states he has Barret's at that time.  Discussed the use of AI scribe software for clinical note transcription with the patient, who gave verbal consent to proceed.  History of Present Illness   [name] is a 58 year old male with Barrett's esophagus and IBS-D who presents with worsening gas pain and swallowing difficulties.  He has experienced severe gas pain for years, described as '  horrible' and 'forceful' without any odor. The pain worsens on an empty stomach and is accompanied by bloating.  He reports a history of Barrett's esophagus and states that he was told this after an endoscopy at [facility] two to three years ago. He takes Nexium  40 mg once daily and Pepcid  in the evening, which generally controls his reflux symptoms. However, he experiences  persistent nausea, particularly in the mornings, and takes ondansetron  twice daily to manage this.  Swallowing difficulties began two to three years ago, with food often sticking and requiring him to spit it back up. He had his throat dilated about five years ago, which initially helped. The swallowing issues are consistent with all types of food, but he has no issues with liquids. He also experiences regurgitation of food, which he attributes to his dentures.  He has a history of IBS-D, but his bowel movements are currently firm and regular, attributed to his use of oxycodone  10 mg four times daily for spinal issues. He also takes dicyclomine  three times daily, which he finds helpful for cramping and gas.  No blood in the stool, dark stools, shortness of breath, or chest pain. He has lost about ten pounds in the last month and a half, which he attributes to pain and possibly swallowing difficulties.        He  reports that he has quit smoking. His smoking use included cigarettes. He started smoking about 40 years ago. He has a 40.6 pack-year smoking history. He has been exposed to tobacco smoke. He has never used smokeless tobacco. He reports that he does not currently use drugs after having used the following drugs: Oxycodone  and Cocaine. He reports that he does not drink alcohol.  RELEVANT GI HISTORY, IMAGING AND LABS: Results   RADIOLOGY Cardiac CT: Normal (12/24/2023)  DIAGNOSTIC Colonoscopy: Normal, random biopsies negative for microscopic colitis (03/08/2015) Esophagogastroduodenoscopy (EGD): Barrett's esophagus      CBC    Component Value Date/Time   WBC 10.2 09/17/2023 1615   WBC 9.5 05/03/2022 1815   RBC 5.40 09/17/2023 1615   RBC 4.47 05/03/2022 1815   HGB 16.7 09/17/2023 1615   HCT 48.3 09/17/2023 1615   PLT 375 09/17/2023 1615   MCV 89 09/17/2023 1615   MCH 30.9 09/17/2023 1615   MCH 29.8 05/03/2022 1815   MCHC 34.6 09/17/2023 1615   MCHC 33.3 05/03/2022 1815   RDW  14.1 09/17/2023 1615   LYMPHSABS 2.5 09/17/2023 1615   MONOABS 1.0 05/03/2022 1815   EOSABS 0.1 09/17/2023 1615   BASOSABS 0.1 09/17/2023 1615   Recent Labs    09/17/23 1615  HGB 16.7    CMP     Component Value Date/Time   NA 137 11/11/2023 0853   K 4.0 11/11/2023 0853   CL 97 11/11/2023 0853   CO2 22 11/11/2023 0853   GLUCOSE 97 11/11/2023 0853   GLUCOSE 108 (H) 05/03/2022 1815   BUN 11 11/11/2023 0853   CREATININE 1.24 11/11/2023 0853   CREATININE 1.01 08/30/2015 1413   CALCIUM  10.4 (H) 11/11/2023 0853   PROT 8.6 (H) 09/17/2023 1615   ALBUMIN 5.5 (H) 09/17/2023 1615   AST 22 09/17/2023 1615   ALT 17 09/17/2023 1615   ALKPHOS 122 (H) 09/17/2023 1615   BILITOT 0.4 09/17/2023 1615   GFRNONAA >60 05/03/2022 1815   GFRAA >60 03/08/2019 1114      Latest Ref Rng & Units 09/17/2023    4:15 PM 07/12/2021    9:16 PM 10/23/2020  9:37 PM  Hepatic Function  Total Protein 6.0 - 8.5 g/dL 8.6  7.6  8.0   Albumin 3.8 - 4.9 g/dL 5.5  4.4  4.3   AST 0 - 40 IU/L 22  20  31    ALT 0 - 44 IU/L 17  22  38   Alk Phosphatase 44 - 121 IU/L 122  78  103   Total Bilirubin 0.0 - 1.2 mg/dL 0.4  <9.8  0.3       Current Medications:   Current Outpatient Medications (Endocrine & Metabolic):    levothyroxine  (SYNTHROID ) 50 MCG tablet, Take 1 tablet (50 mcg total) by mouth daily.  Current Outpatient Medications (Cardiovascular):    Evolocumab  (REPATHA  SURECLICK) 140 MG/ML SOAJ, Inject 140 mg into the skin every 14 (fourteen) days.   isosorbide  mononitrate (IMDUR ) 30 MG 24 hr tablet, Take 1 tablet (30 mg total) by mouth daily.   olmesartan  (BENICAR ) 20 MG tablet, Take 1 tablet (20 mg total) by mouth daily. Start with a half tab and increase to a full tablet if BP is >140/90.  Current Outpatient Medications (Respiratory):    albuterol  (VENTOLIN  HFA) 108 (90 Base) MCG/ACT inhaler, Inhale 2 puffs into the lungs every 6 (six) hours as needed for wheezing or shortness of breath.  Current  Outpatient Medications (Analgesics):    acetaminophen  (TYLENOL ) 325 MG tablet, Take 2 tablets (650 mg total) by mouth every 4 (four) hours as needed for mild pain (1-3) or moderate pain (4-6). (Patient taking differently: Take 325 mg by mouth every 12 (twelve) hours.)   aspirin  EC (ASPIRIN  81) 81 MG tablet, Take 1 tablet (81 mg total) by mouth daily.   oxyCODONE  (OXY IR/ROXICODONE ) 5 MG immediate release tablet, Take 1 tablet (5 mg total) by mouth every 4 (four) hours as needed for moderate pain (4-6).   Oxycodone  HCl 10 MG TABS, Take 1 tablet (10 mg total) by mouth 4 (four) times daily.   Current Outpatient Medications (Other):    amantadine  (SYMMETREL ) 100 MG capsule, Take 1 capsule (100 mg total) by mouth 2 (two) times daily.   busPIRone  (BUSPAR ) 30 MG tablet, Take 30 mg by mouth 3 (three) times daily.   diclofenac  Sodium (VOLTAREN ) 1 % GEL, Apply 2 - 4 grams to painful joints up to 4 times daily if needed.   docusate sodium  (COLACE) 100 MG capsule, Take 1 capsule (100 mg total) by mouth 2 (two) times daily as needed for constipation. (Patient taking differently: Take 100 mg by mouth 3 (three) times daily.)   DULoxetine  HCl 40 MG CPEP, Take 1 capsule (40 mg total) by mouth daily.   ergocalciferol  (VITAMIN D2) 1.25 MG (50000 UT) capsule, Take 1 capsule (50,000 Units total) by mouth once a week.   hydrOXYzine  (ATARAX ) 25 MG tablet, Take 1 tablet (25 mg total) by mouth 3 (three) times daily.   METAMUCIL FIBER PO, Take by mouth. 2 tsp daily   methocarbamol  (ROBAXIN ) 750 MG tablet, Take 1 tablet (750 mg total) by mouth 4 (four) times daily as needed.   metroNIDAZOLE  (FLAGYL ) 500 MG tablet, Take 0.5 tablets (250 mg total) by mouth 3 (three) times daily for 10 days.   Multiple Vitamins-Minerals (CERTAVITE/ANTIOXIDANTS) TABS, Take 1 tablet by mouth daily.   naloxone  (NARCAN ) nasal spray 4 mg/0.1 mL, Use 1 spray in nose as directed   nicotine  (NICODERM CQ ) 21 mg/24hr patch, Place 1 patch (21 mg total)  onto the skin daily.   nicotine  polacrilex (NICOTINE  MINI) 2 MG lozenge,  Take 1 lozenge (2 mg total) by mouth as needed for smoking cessation.   OLANZapine  (ZYPREXA ) 20 MG tablet, Take 20 mg by mouth at bedtime.   traZODone  (DESYREL ) 100 MG tablet, Take 100 mg by mouth at bedtime.   dicyclomine  (BENTYL ) 10 MG capsule, Take 1 capsule (10 mg total) by mouth every 8 (eight) hours as needed.   esomeprazole  (NEXIUM ) 40 MG capsule, Take 1 capsule (40 mg total) by mouth daily.   famotidine  (PEPCID ) 20 MG tablet, Take 1 tablet (20 mg total) by mouth 2 (two) times daily.   ondansetron  (ZOFRAN ) 4 MG tablet, Take 1 tablet (4 mg total) by mouth 4 (four) times daily as needed.  Medical History:  Past Medical History:  Diagnosis Date   Accelerated hypertension 08/23/2016   pt denies this   AKI (acute kidney injury) (HCC) 08/23/2016   Angina, class III (HCC) 08/18/2015   Anxiety    Anxiety state 10/28/2006   Qualifier: Diagnosis of   By: Antonio ROSALEA Rockers      IMO SNOMED Dx Update Oct 2024     Arthritis    Barrett's esophagus with dysplasia 10/27/2023   Bipolar affective disorder (HCC)    Bipolar I disorder, most recent episode (or current) manic (HCC) 10/28/2006   >>OVERVIEW FOR DISORDER, BIPOLAR NOS WRITTEN ON 07/10/2010  5:22 PM BY INTERFACE, PROBLEM LIST IN     Qualifier: Diagnosis of   By: Antonio ROSALEA Rockers         Chronic back pain    Chronic kidney disease 2017   AKF - due to Rhabdomyolysis   Chronic kidney disease 2017   AKF - due to Rhabdomyolysis   Chronic nausea 10/27/2023   Cocaine use disorder, mild, abuse (HCC) 12/26/2015   Coronary artery disease    minimal Mid LAD to Dist LAD lesion, 35% stenosed. Prox Cx lesion, 40% stenosed.   Degenerative disc disease, lumbar 09/07/2013   DJD (degenerative joint disease)    Drug overdose, multiple drugs 08/23/2016   Elevated liver function tests    Family history of premature coronary artery disease 08/18/2015   Fibromyalgia    GERD 10/28/2006    Qualifier: Diagnosis of   By: Antonio ROSALEA Rockers         Head injury    Head injury, closed, with concussion    x 3  from falls   History of rhabdomyolysis 12/26/2015   History of suicidal ideation 10/02/2013   2010, 2012     Hx of renal failure 12/26/2015   Hypercalcemia 10/27/2023   Hyperlipidemia    Hypothyroidism    Inguinal hernia    left   Insomnia    Irritable bowel syndrome 08/09/2008   Qualifier: Diagnosis of   By: Avram MD, NOLIA Pitts E        Leg pain 02/24/2011   Liver injury 12/26/2015   Multiple falls 10/02/2013   Myofascial pain syndrome    Neck pain 02/24/2011   Osteopenia    PAIN, CHRONIC NEC 10/28/2006   Qualifier: Diagnosis of   By: Antonio DO, Yvonne         Postural imbalance with levoscoliosis, h/o 10/02/2013   Primary hypertension 08/23/2016   Primary localized osteoarthritis of right hip 03/15/2019   Primary osteoarthritis of left hip 03/05/2021   Primary osteoarthritis of right hip 03/15/2019   Pseudoarthrosis of cervical spine (HCC) 12/23/2017   Rhabdomyolysis 08/23/2016   Right hip pain 02/24/2011   Spinal cord stimulator, status-post 10/02/2013   Per Dr.  Kritzer 2010     Spinal stenosis of cervical region 09/21/2017   Statin myopathy 10/27/2023   Tachycardia 08/23/2016   Tibial plateau fracture, left    Tobacco use disorder, continuous 08/18/2015   Transaminitis 08/23/2016   Tremor 09/15/2023   Tremors of nervous system    Allergies:  Allergies  Allergen Reactions   Zolpidem  Tartrate Other (See Comments)    Hallucinations and sleep walks   Lyrica [Pregabalin] Other (See Comments)    Caused extreme depression   Flomax [Tamsulosin]    Statins Other (See Comments)    Rhabdomyolysis & Kidney failure    Demerol [Meperidine] Itching, Rash and Hives     Surgical History:  He  has a past surgical history that includes Cervical disc surgery; Septoplasty (2010); Colonoscopy; Spinal cord stimulator implant; ORIF tibia plateau (Left,  10/20/2013); Fasciotomy (Left, 10/20/2013); Inguinal hernia repair (Left); Esophagogastroduodenoscopy; Colonoscopy; Carotid Dopplers (Bilateral, 02/20/2015); transthoracic echocardiogram (02/20/2015); Nuclear Stress Test (03/06/2015); Cardiac catheterization (N/A, 08/31/2015); Spinal cord stimulator battery exchange (N/A, 02/08/2016); Anterior cervical decomp/discectomy fusion (N/A, 09/21/2017); Anterior cervical decomp/discectomy fusion (N/A, 12/23/2017); Spinal cord stimulator battery exchange (N/A, 12/23/2017); Total hip arthroplasty (Right, 03/15/2019); Total hip arthroplasty (Left, 03/05/2021); and prostate implant (10/20/2023). Family History:  His family history includes Diabetes in his brother, father, paternal grandmother, and another family member; Epilepsy in his daughter; Heart attack (age of onset: 33) in his father; Heart disease in his mother; Heart failure in his father; Hyperlipidemia in his father; Hypertension in his father and mother; Kidney disease in his father and another family member; Lung cancer in his maternal grandmother; Sudden death in his mother and paternal grandmother.  REVIEW OF SYSTEMS  : All other systems reviewed and negative except where noted in the History of Present Illness.  PHYSICAL EXAM: BP 106/70   Pulse 86   Ht 5' 11 (1.803 m)   Wt 217 lb (98.4 kg)   BMI 30.27 kg/m  Physical Exam   GENERAL APPEARANCE: Well nourished, in no apparent distress. HEENT: No cervical lymphadenopathy, unremarkable thyroid , sclerae anicteric, conjunctiva pink.Poor dentition RESPIRATORY: Respiratory effort normal, breath sounds equal bilaterally without rales, rhonchi, or wheezing. Lungs clear to auscultation bilaterally. CARDIO: Regular rate and rhythm with no murmurs, rubs, or gallops, peripheral pulses intact. ABDOMEN: Soft, obese, non-distended, active bowel sounds in all four quadrants, no tenderness to palpation, no rebound, no mass appreciated. RECTAL:  Declines. MUSCULOSKELETAL: Full range of motion, antalgic gait, walks with cane, able to get on table easily, without edema. SKIN: Dry, intact without rashes or lesions. No jaundice. NEURO: Alert, oriented, no focal deficits. PSYCH: Cooperative, normal mood and affect.      Alan JONELLE Coombs, PA-C 11:52 AM

## 2024-01-08 ENCOUNTER — Other Ambulatory Visit (HOSPITAL_BASED_OUTPATIENT_CLINIC_OR_DEPARTMENT_OTHER): Payer: Self-pay

## 2024-01-08 ENCOUNTER — Ambulatory Visit: Payer: Self-pay | Admitting: Physician Assistant

## 2024-01-08 MED ORDER — OXYCODONE HCL 10 MG PO TABS
10.0000 mg | ORAL_TABLET | Freq: Four times a day (QID) | ORAL | 0 refills | Status: DC
  Filled 2024-01-08: qty 120, 30d supply, fill #0

## 2024-01-08 MED ORDER — AMANTADINE HCL 100 MG PO CAPS
100.0000 mg | ORAL_CAPSULE | Freq: Two times a day (BID) | ORAL | 0 refills | Status: DC
  Filled 2024-01-08 – 2024-01-21 (×2): qty 180, 90d supply, fill #0

## 2024-01-12 NOTE — Telephone Encounter (Signed)
Pts wife advised and voices understanding

## 2024-01-20 ENCOUNTER — Other Ambulatory Visit (HOSPITAL_BASED_OUTPATIENT_CLINIC_OR_DEPARTMENT_OTHER): Payer: Self-pay

## 2024-01-21 ENCOUNTER — Other Ambulatory Visit (HOSPITAL_BASED_OUTPATIENT_CLINIC_OR_DEPARTMENT_OTHER): Payer: Self-pay

## 2024-01-22 ENCOUNTER — Other Ambulatory Visit (HOSPITAL_BASED_OUTPATIENT_CLINIC_OR_DEPARTMENT_OTHER): Payer: Self-pay

## 2024-01-25 ENCOUNTER — Other Ambulatory Visit (HOSPITAL_BASED_OUTPATIENT_CLINIC_OR_DEPARTMENT_OTHER): Payer: Self-pay

## 2024-01-27 ENCOUNTER — Telehealth: Payer: Self-pay | Admitting: Physician Assistant

## 2024-01-27 NOTE — Telephone Encounter (Signed)
 Inbound call from patients wife stating she wasn't able to reply to MyChart message from [place] but states patient is feeling better and will try miralax .  Please advise  Thank you

## 2024-02-01 ENCOUNTER — Other Ambulatory Visit (HOSPITAL_BASED_OUTPATIENT_CLINIC_OR_DEPARTMENT_OTHER): Payer: Self-pay

## 2024-02-02 ENCOUNTER — Encounter: Payer: Self-pay | Admitting: Internal Medicine

## 2024-02-02 ENCOUNTER — Other Ambulatory Visit (HOSPITAL_BASED_OUTPATIENT_CLINIC_OR_DEPARTMENT_OTHER): Payer: Self-pay

## 2024-02-02 ENCOUNTER — Other Ambulatory Visit: Payer: Self-pay

## 2024-02-02 ENCOUNTER — Ambulatory Visit: Payer: MEDICAID | Attending: Cardiology | Admitting: Cardiology

## 2024-02-02 ENCOUNTER — Encounter: Payer: Self-pay | Admitting: Cardiology

## 2024-02-02 ENCOUNTER — Ambulatory Visit: Payer: MEDICAID | Admitting: Internal Medicine

## 2024-02-02 VITALS — BP 121/79 | HR 63 | Temp 98.0°F | Resp 11 | Ht 71.0 in | Wt 217.0 lb

## 2024-02-02 VITALS — BP 124/70 | HR 80 | Ht 72.0 in | Wt 209.6 lb

## 2024-02-02 DIAGNOSIS — E782 Mixed hyperlipidemia: Secondary | ICD-10-CM | POA: Diagnosis present

## 2024-02-02 DIAGNOSIS — F17209 Nicotine dependence, unspecified, with unspecified nicotine-induced disorders: Secondary | ICD-10-CM | POA: Diagnosis present

## 2024-02-02 DIAGNOSIS — F319 Bipolar disorder, unspecified: Secondary | ICD-10-CM | POA: Diagnosis present

## 2024-02-02 DIAGNOSIS — R1319 Other dysphagia: Secondary | ICD-10-CM

## 2024-02-02 DIAGNOSIS — K2289 Other specified disease of esophagus: Secondary | ICD-10-CM

## 2024-02-02 DIAGNOSIS — I25118 Atherosclerotic heart disease of native coronary artery with other forms of angina pectoris: Secondary | ICD-10-CM | POA: Diagnosis present

## 2024-02-02 DIAGNOSIS — R1310 Dysphagia, unspecified: Secondary | ICD-10-CM | POA: Diagnosis not present

## 2024-02-02 DIAGNOSIS — K219 Gastro-esophageal reflux disease without esophagitis: Secondary | ICD-10-CM | POA: Diagnosis not present

## 2024-02-02 MED ORDER — SODIUM CHLORIDE 0.9 % IV SOLN: 500.0000 mL | INTRAVENOUS | Status: AC

## 2024-02-02 NOTE — Patient Instructions (Addendum)
 Please read handouts provided. Continue present medications. Dilation Diet. Await pathology results.    YOU HAD AN ENDOSCOPIC PROCEDURE TODAY AT THE [facility]:   Refer to the procedure report that was given to you for any specific questions about what was found during the examination.  If the procedure report does not answer your questions, please call your gastroenterologist to clarify.  If you requested that your care partner not be given the details of your procedure findings, then the procedure report has been included in a sealed envelope for you to review at your convenience later.  YOU SHOULD EXPECT: Some feelings of bloating in the abdomen. Passage of more gas than usual.  Walking can help get rid of the air that was put into your GI tract during the procedure and reduce the bloating. If you had a lower endoscopy (such as a colonoscopy or flexible sigmoidoscopy) you may notice spotting of blood in your stool or on the toilet paper. If you underwent a bowel prep for your procedure, you may not have a normal bowel movement for a few days.  Please Note:  You might notice some irritation and congestion in your nose or some drainage.  This is from the oxygen used during your procedure.  There is no need for concern and it should clear up in a day or so.  SYMPTOMS TO REPORT IMMEDIATELY:  Following upper endoscopy (EGD)  Vomiting of blood or coffee ground material  New chest pain or pain under the shoulder blades  Painful or persistently difficult swallowing  New shortness of breath  Fever of 100F or higher  Black, tarry-looking stools  For urgent or emergent issues, a gastroenterologist can be reached at any hour by calling [phone]. Do not use MyChart messaging for urgent concerns.    DIET: Drink plenty of fluids but you should avoid alcoholic beverages for 24 hours.  ACTIVITY:  You should plan to take it easy for the rest of today and you should NOT DRIVE or use  heavy machinery until tomorrow (because of the sedation medicines used during the test).    FOLLOW UP: Our staff will call the number listed on your records the next business day following your procedure.  We will call around 7:15- 8:00 am to check on you and address any questions or concerns that you may have regarding the information given to you following your procedure. If we do not reach you, we will leave a message.     If any biopsies were taken you will be contacted by phone or by letter within the next 1-3 weeks.  Please call us  at [phone] if you have not heard about the biopsies in 3 weeks.    SIGNATURES/CONFIDENTIALITY: You and/or your care partner have signed paperwork which will be entered into your electronic medical record.  These signatures attest to the fact that that the information above on your After Visit Summary has been reviewed and is understood.  Full responsibility of the confidentiality of this discharge information lies with you and/or your care-partner.I do not see clear evidence of Barrett's esophagus. I did check with biopsies and will let you know.  I also dilated the esophagus.  I appreciate the opportunity to care for you. Lupita CHARLENA Commander, MD, NOLIA

## 2024-02-02 NOTE — Patient Instructions (Signed)
Medication Instructions:  Your physician recommends that you continue on your current medications as directed. Please refer to the Current Medication list given to you today.  *If you need a refill on your cardiac medications before your next appointment, please call your pharmacy*   Lab Work: Lipid, AST, ALT- today If you have labs (blood work) drawn today and your tests are completely normal, you will receive your results only by: MyChart Message (if you have MyChart) OR A paper copy in the mail If you have any lab test that is abnormal or we need to change your treatment, we will call you to review the results.   Testing/Procedures: None Ordered   Follow-Up: At [facility], you and your health needs are our priority.  As part of our continuing mission to provide you with exceptional heart care, we have created designated Provider Care Teams.  These Care Teams include your primary Cardiologist (physician) and Advanced Practice Providers (APPs -  Physician Assistants and Nurse Practitioners) who all work together to provide you with the care you need, when you need it.  We recommend signing up for the patient portal called "MyChart".  Sign up information is provided on this After Visit Summary.  MyChart is used to connect with patients for Virtual Visits (Telemedicine).  Patients are able to view lab/test results, encounter notes, upcoming appointments, etc.  Non-urgent messages can be sent to your provider as well.   To learn more about what you can do with MyChart, go to [email or web address].    Your next appointment:   6 month(s)  The format for your next appointment:   In Person  Provider:   Gypsy Balsam, MD    Other Instructions NA

## 2024-02-02 NOTE — Progress Notes (Signed)
 History and Physical Interval Note:  02/02/2024 3:16 PM  [name]  has presented today for endoscopic procedure(s), with the diagnosis of  Encounter Diagnoses  Name Primary?   Gastroesophageal reflux disease, unspecified whether esophagitis present Yes   Esophageal dysphagia   .  The various methods of evaluation and treatment have been discussed with the patient and/or family. After consideration of risks, benefits and other options for treatment, the patient has consented to  the endoscopic procedure(s).   The patient's history has been reviewed, patient examined, no change in status, stable for endoscopic procedure(s).  I have reviewed the patient's chart and labs.  Questions were answered to the patient's satisfaction.     Travis CHARLENA Commander, MD, NOLIA

## 2024-02-02 NOTE — Progress Notes (Signed)
 To pacu, VSS. Report to [email or web address]

## 2024-02-02 NOTE — Progress Notes (Signed)
 Called to room to assist during endoscopic procedure.  Patient ID and intended procedure confirmed with present staff. Received instructions for my participation in the procedure from the performing physician.

## 2024-02-02 NOTE — Op Note (Signed)
  [facility] Patient Name: [name] Procedure Date: 02/02/2024 2:48 PM MRN: [id number] Endoscopist: Lupita FORBES Commander , MD, [id number] Age: 58 Referring MD:  Date of Birth: 1966-02-15 Gender: Male Account #: [id number] Procedure:                Upper GI endoscopy Indications:              Dysphagia, Gastro-esophageal reflux disease Medicines:                Monitored Anesthesia Care Procedure:                Pre-Anesthesia Assessment:                           - Prior to the procedure, a History and Physical                            was performed, and patient medications and                            allergies were reviewed. The patient's tolerance of                            previous anesthesia was also reviewed. The risks                            and benefits of the procedure and the sedation                            options and risks were discussed with the patient.                            All questions were answered, and informed consent                            was obtained. Prior Anticoagulants: The patient has                            taken no anticoagulant or antiplatelet agents. ASA                            Grade Assessment: III - A patient with severe                            systemic disease. After reviewing the risks and                            benefits, the patient was deemed in satisfactory                            condition to undergo the procedure.                           After obtaining informed consent, the endoscope was  passed under direct vision. Throughout the                            procedure, the patient's blood pressure, pulse, and                            oxygen saturations were monitored continuously. The                            GIF [id number] #[id number] was introduced through the                            mouth, and advanced to the second part of duodenum.                            The upper  GI endoscopy was accomplished without                            difficulty. The patient tolerated the procedure                            well. Scope In: Scope Out: Findings:                 The Z-line was irregular and was found at the                            gastroesophageal junction. Biopsies were taken with                            a cold forceps for histology. Verification of                            patient identification for the specimen was done.                            Estimated blood loss was minimal.                           The exam was otherwise without abnormality.                           The cardia and gastric fundus were normal on                            retroflexion.                           The scope was withdrawn. Dilation was performed in                            the entire esophagus with a Maloney dilator with no                            resistance at 54 Fr. Estimated blood loss:  none. No                            mucosal disruption seen on relook. Complications:            No immediate complications. Impression:               - Z-line irregular, at the gastroesophageal                            junction. Biopsied a columnar tongue that might 10                            mm long.                           - The examination was otherwise normal.                           - Dilation performed in the entire esophagus - 61                            French - relook no disruption. Recommendation:           - Patient has a contact number available for                            emergencies. The signs and symptoms of potential                            delayed complications were discussed with the                            patient. Return to normal activities tomorrow.                            Written discharge instructions were provided to the                            patient.                           - Clear liquids x 1 hour then soft  foods rest of                            day. Start prior diet tomorrow.                           - Continue present medications.                           - Await pathology results.                           - Dentition, chronic narcotic use could play a role  in dysphagia. Lupita FORBES Commander, MD 02/02/2024 3:21:53 PM This report has been signed electronically.

## 2024-02-02 NOTE — Progress Notes (Unsigned)
 Cardiology Office Note:    Date:  02/02/2024   ID:  [name], DOB [birth year], MRN [id number]  PCP:  Travis Lang DASEN, PA-C  Cardiologist:  Lamar Fitch, MD    Referring MD: Travis Lang DASEN, PA-C   No chief complaint on file.   History of Present Illness:    [name] is a 58 y.o. male past medical history significant for chronic smoking just ongoing, essential hypertension, dyslipidemia with very high LDL of 210 he was on Repatha  however then ended up stopping it because of financial issue now he is back on it he does have chronic back pain as well as some orthopedic limitations.  In 2017 he did have cardiac catheterization which showed only mild disease involving circumflex and LAD.  We decided to do coronary CT angio and that showed me moderate disease involving circumflex FFR negative.  Comes today to talk about this.  Overall doing well cardiac wise described failure to get some psychiatric issue.  Sadly still continues to smoke but trying to cut down he smokes 1-2 a day in the morning and he takes also some nicotine  patch.  Past Medical History:  Diagnosis Date   Accelerated hypertension 08/23/2016   pt denies this   AKI (acute kidney injury) (HCC) 08/23/2016   Angina, class III (HCC) 08/18/2015   Anxiety    Anxiety state 10/28/2006   Qualifier: Diagnosis of   By: Antonio ROSALEA Rockers      IMO SNOMED Dx Update Oct 2024     Arthritis    Barrett's esophagus with dysplasia 10/27/2023   Bipolar affective disorder (HCC)    Bipolar I disorder, most recent episode (or current) manic (HCC) 10/28/2006   >>OVERVIEW FOR DISORDER, BIPOLAR NOS WRITTEN ON 07/10/2010  5:22 PM BY INTERFACE, PROBLEM LIST IN     Qualifier: Diagnosis of   By: Antonio ROSALEA Rockers         Chronic back pain    Chronic kidney disease 2017   AKF - due to Rhabdomyolysis   Chronic kidney disease 2017   AKF - due to Rhabdomyolysis   Chronic nausea 10/27/2023   Cocaine use disorder, mild, abuse (HCC) 12/26/2015    Coronary artery disease    minimal Mid LAD to Dist LAD lesion, 35% stenosed. Prox Cx lesion, 40% stenosed.   Degenerative disc disease, lumbar 09/07/2013   DJD (degenerative joint disease)    Drug overdose, multiple drugs 08/23/2016   Elevated liver function tests    Family history of premature coronary artery disease 08/18/2015   Fibromyalgia    GERD 10/28/2006   Qualifier: Diagnosis of   By: Antonio ROSALEA Rockers         Head injury    Head injury, closed, with concussion    x 3  from falls   History of rhabdomyolysis 12/26/2015   History of suicidal ideation 10/02/2013   2010, 2012     Hx of renal failure 12/26/2015   Hypercalcemia 10/27/2023   Hyperlipidemia    Hypothyroidism    Inguinal hernia    left   Insomnia    Irritable bowel syndrome 08/09/2008   Qualifier: Diagnosis of   By: Avram MD, NOLIA Pitts E        Leg pain 02/24/2011   Liver injury 12/26/2015   Multiple falls 10/02/2013   Myofascial pain syndrome    Neck pain 02/24/2011   Osteopenia    PAIN, CHRONIC NEC 10/28/2006   Qualifier: Diagnosis of   By:  Lowne DO, Yvonne         Postural imbalance with levoscoliosis, h/o 10/02/2013   Primary hypertension 08/23/2016   Primary localized osteoarthritis of right hip 03/15/2019   Primary osteoarthritis of left hip 03/05/2021   Primary osteoarthritis of right hip 03/15/2019   Pseudoarthrosis of cervical spine (HCC) 12/23/2017   Rhabdomyolysis 08/23/2016   Right hip pain 02/24/2011   Spinal cord stimulator, status-post 10/02/2013   Per Dr. Carles 2010     Spinal stenosis of cervical region 09/21/2017   Statin myopathy 10/27/2023   Tachycardia 08/23/2016   Tibial plateau fracture, left    Tobacco use disorder, continuous 08/18/2015   Transaminitis 08/23/2016   Tremor 09/15/2023   Tremors of nervous system     Past Surgical History:  Procedure Laterality Date   ANTERIOR CERVICAL DECOMP/DISCECTOMY FUSION N/A 09/21/2017   Procedure: ANTERIOR CERVICAL  DECOMPRESSION/DISCECTOMY FUSION - CERVICAL THREE-CERVICAL FOUR;  Surgeon: Onetha Kuba, MD;  Location: [facility] OR;  Service: Neurosurgery;  Laterality: N/A;   ANTERIOR CERVICAL DECOMP/DISCECTOMY FUSION N/A 12/23/2017   Procedure: REMOVAL AND REPLACEMENT OF CERVICAL THREE-FOUR HARDWARE;  Surgeon: Onetha Kuba, MD;  Location: [facility] OR;  Service: Neurosurgery;  Laterality: N/A;   CARDIAC CATHETERIZATION N/A 08/31/2015   Procedure: Left Heart Cath and Coronary Angiography;  Surgeon: Alm LELON Clay, MD;  Location: [facility] INVASIVE CV LAB;  Service: Cardiovascular;  Laterality: N/A;   Carotid Dopplers Bilateral 02/20/2015   [facility]: Mild, less than 39% left and right internal carotid artery stenosis. No significant plaque burden   CERVICAL DISC SURGERY     C5-7   COLONOSCOPY     COLONOSCOPY     ESOPHAGOGASTRODUODENOSCOPY     FASCIOTOMY Left 10/20/2013   Procedure: LEFT leg ANTERIOR COMPARTMENT FACSCIOTOMY;  Surgeon: Ozell VEAR Bruch, MD;  Location: [facility] OR;  Service: Orthopedics;  Laterality: Left;   INGUINAL HERNIA REPAIR Left    Nuclear Stress Test  03/06/2015   [facility]: Normal EKG. Low normal EF (49%) normal regional wall motion. No evidence of ischemia or infarction.   ORIF TIBIA PLATEAU Left 10/20/2013   Procedure: OPEN REDUCTION INTERNAL FIXATION (ORIF) LEFT TIBIAL PLATEAU;  Surgeon: Ozell VEAR Bruch, MD;  Location: [facility] OR;  Service: Orthopedics;  Laterality: Left;   prostate implant  10/20/2023   SEPTOPLASTY  2010   SPINAL CORD STIMULATOR BATTERY EXCHANGE N/A 02/08/2016   Procedure: Lumbar spinal cord stimulator implantable pulse generator replacement;  Surgeon: Deward Fabian, MD;  Location: [facility] NEURO ORS;  Service: Neurosurgery;  Laterality: N/A;   SPINAL CORD STIMULATOR BATTERY EXCHANGE N/A 12/23/2017   Procedure: SPINAL CORD STIMULATOR BATTERY EXCHANGE;  Surgeon: Fabian Deward, MD;  Location: [facility] OR;  Service: Neurosurgery;  Laterality: N/A;   SPINAL CORD STIMULATOR IMPLANT      TOTAL HIP ARTHROPLASTY Right 03/15/2019   Procedure: RIGHT TOTAL HIP ARTHROPLASTY ANTERIOR APPROACH;  Surgeon: Sheril Coy, MD;  Location: [facility] ORS;  Service: Orthopedics;  Laterality: Right;   TOTAL HIP ARTHROPLASTY Left 03/05/2021   Procedure: LEFT TOTAL HIP ARTHROPLASTY ANTERIOR APPROACH;  Surgeon: Sheril Coy, MD;  Location: [facility] ORS;  Service: Orthopedics;  Laterality: Left;   TRANSTHORACIC ECHOCARDIOGRAM  02/20/2015   [facility]: Mild concentric LVH. EF 55-60%. Normal regional wall motion. Mild to moderate TR with no significant pulmonary hypertension.    Current Medications: Current Meds  Medication Sig   acetaminophen  (TYLENOL ) 325 MG tablet Take 2 tablets (650 mg total) by mouth every 4 (four) hours as needed for mild pain (1-3)  or moderate pain (4-6). (Patient taking differently: Take 325 mg by mouth every 12 (twelve) hours.)   albuterol  (VENTOLIN  HFA) 108 (90 Base) MCG/ACT inhaler Inhale 2 puffs into the lungs every 6 (six) hours as needed for wheezing or shortness of breath.   amantadine  (SYMMETREL ) 100 MG capsule Take 1 capsule (100 mg total) by mouth 2 (two) times daily.   aspirin  EC (ASPIRIN  81) 81 MG tablet Take 1 tablet (81 mg total) by mouth daily.   busPIRone  (BUSPAR ) 30 MG tablet Take 30 mg by mouth 3 (three) times daily.   diclofenac  Sodium (VOLTAREN ) 1 % GEL Apply 2 - 4 grams to painful joints up to 4 times daily if needed.   dicyclomine  (BENTYL ) 10 MG capsule Take 1 capsule (10 mg total) by mouth every 8 (eight) hours as needed.   docusate sodium  (COLACE) 100 MG capsule Take 1 capsule (100 mg total) by mouth 2 (two) times daily as needed for constipation. (Patient taking differently: Take 100 mg by mouth 3 (three) times daily.)   DULoxetine  HCl 40 MG CPEP Take 1 capsule (40 mg total) by mouth daily.   ergocalciferol  (VITAMIN D2) 1.25 MG (50000 UT) capsule Take 1 capsule (50,000 Units total) by mouth once a week.   esomeprazole  (NEXIUM ) 40 MG capsule Take 1  capsule (40 mg total) by mouth daily.   Evolocumab  (REPATHA  SURECLICK) 140 MG/ML SOAJ Inject 140 mg into the skin every 14 (fourteen) days.   famotidine  (PEPCID ) 20 MG tablet Take 1 tablet (20 mg total) by mouth 2 (two) times daily.   hydrOXYzine  (ATARAX ) 25 MG tablet Take 1 tablet (25 mg total) by mouth 3 (three) times daily.   isosorbide  mononitrate (IMDUR ) 30 MG 24 hr tablet Take 1 tablet (30 mg total) by mouth daily.   levothyroxine  (SYNTHROID ) 50 MCG tablet Take 1 tablet (50 mcg total) by mouth daily.   METAMUCIL FIBER PO Take by mouth. 2 tsp daily   methocarbamol  (ROBAXIN ) 750 MG tablet Take 1 tablet (750 mg total) by mouth 4 (four) times daily as needed.   Multiple Vitamins-Minerals (CERTAVITE/ANTIOXIDANTS) TABS Take 1 tablet by mouth daily.   naloxone  (NARCAN ) nasal spray 4 mg/0.1 mL Use 1 spray in nose as directed   nicotine  (NICODERM CQ ) 21 mg/24hr patch Place 1 patch (21 mg total) onto the skin daily.   nicotine  polacrilex (NICOTINE  MINI) 2 MG lozenge Take 1 lozenge (2 mg total) by mouth as needed for smoking cessation.   OLANZapine  (ZYPREXA ) 20 MG tablet Take 20 mg by mouth at bedtime.   olmesartan  (BENICAR ) 20 MG tablet Take 1 tablet (20 mg total) by mouth daily. Start with a half tab and increase to a full tablet if BP is >140/90.   ondansetron  (ZOFRAN ) 4 MG tablet Take 1 tablet (4 mg total) by mouth 4 (four) times daily as needed.   oxyCODONE  (OXY IR/ROXICODONE ) 5 MG immediate release tablet Take 1 tablet (5 mg total) by mouth every 4 (four) hours as needed for moderate pain (4-6).   Oxycodone  HCl 10 MG TABS Take 1 tablet (10 mg total) by mouth 4 (four) times daily.   traZODone  (DESYREL ) 100 MG tablet Take 100 mg by mouth at bedtime.     Allergies:   Zolpidem  tartrate, Lyrica [pregabalin], Flomax [tamsulosin], Statins, and Demerol [meperidine]   Social History   Socioeconomic History   Marital status: Legally Separated    Spouse name: Not on file   Number of children: 1    Years of education:  Not on file   Highest education level: Some college, no degree  Occupational History   Occupation: disabled   Occupation: disable  Tobacco Use   Smoking status: Former    Current packs/day: 1.00    Average packs/day: 1 pack/day for 40.7 years (40.7 ttl pk-yrs)    Types: Cigarettes    Start date: 1985    Passive exposure: Current   Smokeless tobacco: Never  Vaping Use   Vaping status: Never Used  Substance and Sexual Activity   Alcohol use: No    Alcohol/week: 0.0 standard drinks of alcohol    Comment: none since 1999- heavy drinker in past   Drug use: Not Currently    Types: Oxycodone , Cocaine    Comment: rarely- last 2015-   Sexual activity: Not on file  Other Topics Concern   Not on file  Social History Narrative   He is a separated father of 1. His daughter is married and recent OT graduate living in [place].   He is currently disabled due to chronic back pain.    He did some college education.   He currently is trying to use electronic cigarettes but has smoked one pack a day for over 35 years.   He has recently stopped using prescription benzodiazepine and pain medications because he is in the process of changing pain med doctors.   He does not routinely exercise secondary to back, neck pain as well as dyspnea.   Right handed    Social Drivers of Health   Financial Resource Strain: Medium Risk (12/28/2023)   Overall Financial Resource Strain (CARDIA)    Difficulty of Paying Living Expenses: Somewhat hard  Food Insecurity: Food Insecurity Present (12/28/2023)   Hunger Vital Sign    Worried About Running Out of Food in the Last Year: Often true    Ran Out of Food in the Last Year: Often true  Transportation Needs: No Transportation Needs (12/28/2023)   PRAPARE - Administrator, Civil Service (Medical): No    Lack of Transportation (Non-Medical): No  Physical Activity: Inactive (12/28/2023)   Exercise Vital Sign    Days of Exercise per  Week: 0 days    Minutes of Exercise per Session: Not on file  Stress: Stress Concern Present (12/28/2023)   [employer] of Occupational Health - Occupational Stress Questionnaire    Feeling of Stress: To some extent  Social Connections: Socially Isolated (12/28/2023)   Social Connection and Isolation Panel    Frequency of Communication with Friends and Family: Never    Frequency of Social Gatherings with Friends and Family: Never    Attends Religious Services: Never    Diplomatic Services operational officer: No    Attends Engineer, structural: Not on file    Marital Status: Separated     Family History: The patient's family history includes Diabetes in his brother, father, paternal grandmother, and another family member; Epilepsy in his daughter; Heart attack (age of onset: 69) in his father; Heart disease in his mother; Heart failure in his father; Hyperlipidemia in his father; Hypertension in his father and mother; Kidney disease in his father and another family member; Lung cancer in his maternal grandmother; Sudden death in his mother and paternal grandmother. There is no history of Colon cancer or Mental illness. ROS:   Please see the history of present illness.    All 14 point review of systems negative except as described per history of present illness  EKGs/Labs/Other Studies  Reviewed:         Recent Labs: 09/17/2023: TSH 1.370 01/07/2024: ALT 17; BUN 19; Creatinine, Ser 0.93; Hemoglobin 13.4; Platelets 308.0; Potassium 3.5; Sodium 139  Recent Lipid Panel    Component Value Date/Time   CHOL 308 (H) 09/17/2023 1615   TRIG 145 09/17/2023 1615   HDL 72 09/17/2023 1615   CHOLHDL 4.3 09/17/2023 1615   CHOLHDL 3.1 07/16/2021 0615   VLDL 30 07/16/2021 0615   LDLCALC 210 (H) 09/17/2023 1615    Physical Exam:    VS:  BP 124/70   Pulse 80   Ht 6' (1.829 m)   Wt 209 lb 9.6 oz (95.1 kg)   SpO2 98%   BMI 28.43 kg/m     Wt Readings from Last 3 Encounters:   02/02/24 209 lb 9.6 oz (95.1 kg)  01/07/24 217 lb (98.4 kg)  12/29/23 201 lb 14.4 oz (91.6 kg)     GEN:  Well nourished, well developed in no acute distress HEENT: Normal NECK: No JVD; No carotid bruits LYMPHATICS: No lymphadenopathy CARDIAC: RRR, no murmurs, no rubs, no gallops RESPIRATORY:  Clear to auscultation without rales, wheezing or rhonchi  ABDOMEN: Soft, non-tender, non-distended MUSCULOSKELETAL:  No edema; No deformity  SKIN: Warm and dry LOWER EXTREMITIES: no swelling NEUROLOGIC:  Alert and oriented x 3 PSYCHIATRIC:  Normal affect   ASSESSMENT:    1. Coronary artery disease of native artery of native heart with stable angina pectoris (HCC)   2. Bipolar affective disorder, remission status unspecified (HCC)   3. Mixed hyperlipidemia   4. Tobacco use disorder, continuous    PLAN:    In order of problems listed above:  Coronary disease stable from that point review I did review coronary CT angio with him.  Nonobstructive disease but moderate clearly needs to be prevented progression of it.  He is on antiplatelet therapy which we will continue. Mixed dyslipidemia I did review K PN which show me his LDL 210 HDL 72 since that time he started on Repatha  we will do fasting lipid profile is TLT. Tobacco abuse we had a long discussion and strongly recommended to quit he understand he is working on it already smokes only 1 to 2 cigarettes a day, he is taking nicotine  patch which helps him.  The goal is to come discontinue completely. Bipolar disorder doing well.   Medication Adjustments/Labs and Tests Ordered: Current medicines are reviewed at length with the patient today.  Concerns regarding medicines are outlined above.  No orders of the defined types were placed in this encounter.  Medication changes: No orders of the defined types were placed in this encounter.   Signed, Lamar DOROTHA Fitch, MD, [facility] 02/02/2024 9:51 AM    [facility]

## 2024-02-02 NOTE — Progress Notes (Signed)
 Pt's states no medical or surgical changes since previsit or office visit.

## 2024-02-03 ENCOUNTER — Other Ambulatory Visit (HOSPITAL_COMMUNITY): Payer: Self-pay

## 2024-02-03 ENCOUNTER — Other Ambulatory Visit (HOSPITAL_BASED_OUTPATIENT_CLINIC_OR_DEPARTMENT_OTHER): Payer: Self-pay

## 2024-02-03 ENCOUNTER — Telehealth: Payer: Self-pay

## 2024-02-03 LAB — LIPID PANEL
Chol/HDL Ratio: 2.2 ratio (ref 0.0–5.0)
Cholesterol, Total: 135 mg/dL (ref 100–199)
HDL: 61 mg/dL (ref >39)
LDL Chol Calc (NIH): 53 mg/dL (ref 0–99)
Triglycerides: 119 mg/dL (ref 0–149)
VLDL Cholesterol Cal: 21 mg/dL (ref 5–40)

## 2024-02-03 LAB — AST: AST: 15 IU/L (ref 0–40)

## 2024-02-03 LAB — ALT: ALT: 13 IU/L (ref 0–44)

## 2024-02-03 MED ORDER — OXYCODONE HCL 10 MG PO TABS
10.0000 mg | ORAL_TABLET | Freq: Four times a day (QID) | ORAL | 0 refills | Status: DC
  Filled 2024-02-03 – 2024-02-05 (×2): qty 120, 30d supply, fill #0

## 2024-02-03 NOTE — Telephone Encounter (Signed)
  Follow up Call-     02/02/2024    2:05 PM  Call back number  Post procedure Call Back phone  # [phone]  Permission to leave phone message Yes  comments Voicemail not set up     Patient questions:  Do you have a fever, pain , or abdominal swelling? No. Pain Score  0 *  Have you tolerated food without any problems? Yes.    Have you been able to return to your normal activities? Yes.    Do you have any questions about your discharge instructions: Diet   No. Medications  No. Follow up visit  No.  Do you have questions or concerns about your Care? No.  Actions: * If pain score is 4 or above: No action needed, pain <4.

## 2024-02-04 ENCOUNTER — Ambulatory Visit: Payer: Self-pay | Admitting: Cardiology

## 2024-02-05 ENCOUNTER — Other Ambulatory Visit (HOSPITAL_BASED_OUTPATIENT_CLINIC_OR_DEPARTMENT_OTHER): Payer: Self-pay

## 2024-02-05 ENCOUNTER — Ambulatory Visit: Payer: Self-pay | Admitting: Internal Medicine

## 2024-02-05 LAB — SURGICAL PATHOLOGY

## 2024-02-09 ENCOUNTER — Encounter (HOSPITAL_BASED_OUTPATIENT_CLINIC_OR_DEPARTMENT_OTHER): Payer: Self-pay | Admitting: Student

## 2024-02-09 ENCOUNTER — Other Ambulatory Visit (HOSPITAL_BASED_OUTPATIENT_CLINIC_OR_DEPARTMENT_OTHER): Payer: Self-pay

## 2024-02-09 ENCOUNTER — Ambulatory Visit (HOSPITAL_BASED_OUTPATIENT_CLINIC_OR_DEPARTMENT_OTHER): Payer: MEDICAID | Admitting: Student

## 2024-02-09 VITALS — BP 137/80 | HR 77 | Temp 98.6°F | Resp 16 | Ht 71.0 in | Wt 213.2 lb

## 2024-02-09 DIAGNOSIS — Z23 Encounter for immunization: Secondary | ICD-10-CM | POA: Diagnosis not present

## 2024-02-09 DIAGNOSIS — I10 Essential (primary) hypertension: Secondary | ICD-10-CM

## 2024-02-09 DIAGNOSIS — Z720 Tobacco use: Secondary | ICD-10-CM | POA: Diagnosis not present

## 2024-02-09 DIAGNOSIS — E7800 Pure hypercholesterolemia, unspecified: Secondary | ICD-10-CM

## 2024-02-09 DIAGNOSIS — J410 Simple chronic bronchitis: Secondary | ICD-10-CM | POA: Diagnosis not present

## 2024-02-09 MED ORDER — BREZTRI AEROSPHERE 160-9-4.8 MCG/ACT IN AERO
2.0000 | INHALATION_SPRAY | Freq: Two times a day (BID) | RESPIRATORY_TRACT | 11 refills | Status: DC
  Filled 2024-02-09: qty 10.7, 30d supply, fill #0

## 2024-02-09 MED ORDER — ALBUTEROL SULFATE HFA 108 (90 BASE) MCG/ACT IN AERS
2.0000 | INHALATION_SPRAY | Freq: Four times a day (QID) | RESPIRATORY_TRACT | 5 refills | Status: AC | PRN
  Filled 2024-02-09: qty 18, 25d supply, fill #0
  Filled 2024-05-08: qty 18, 25d supply, fill #1
  Filled 2024-05-09: qty 6.7, 25d supply, fill #1
  Filled 2024-06-19: qty 18, 25d supply, fill #2

## 2024-02-09 NOTE — Patient Instructions (Signed)
 It was nice to see you today!  If you have any problems before your next visit feel free to message me via MyChart (minor issues or questions) or call the office, otherwise you may reach out to schedule an office visit.  Thank you! Pau Banh, PA-C

## 2024-02-09 NOTE — Progress Notes (Unsigned)
 Established Patient Office Visit  Subjective   Patient ID: [name], male    DOB: 10-21-1965  Age: 58 y.o. MRN: [id number]  Chief Complaint  Patient presents with   Medical Management of Chronic Issues    Follow up. Only taking 1/2 tablet of olmesartan  20mg  due to BP dropping to 60/40. BP ranging 60-190/50-170.    HPI  Discussed the use of AI scribe software for clinical note transcription with the patient, who gave verbal consent to proceed.  History of Present Illness   [name] is a 58 year old male with hypertension who presents with fluctuating blood pressure readings and medication management issues.  He experiences fluctuating blood pressure readings despite taking a half dose of olmesartan . He reports that his blood pressure readings are 'all over the place,' with both high and low values. He reports taking his medication at the same time every day. Previously, his blood pressure was elevated when not on medication, and he resumed olmesartan  at a half dose as advised. No dizziness, shortness of breath, or chest pain on the current dose, but he does experience swelling in his ankles and fingers, particularly in the morning, which resolves by noon.  He has a history of smoking and currently smokes about half a pack a day, reduced from a pack and a half. He uses nicotine  lozenges to aid in reducing smoking, especially in the mornings and after meals. He consumes two to three cups of coffee in the morning and three to four eight-ounce glasses of Pepsi throughout the day, which may contribute to his blood pressure fluctuations.  He has a history of wheezing and uses albuterol  twice daily. He experiences difficulty breathing over long distances or with quick activities, leading to fatigue. He has had bronchitis several times and a history of 'aspirin  pneumonia.'  He is under psychiatric care with [facility], receiving group and individual therapy. His medication regimen includes  Depakote , which was recently added. He reports that his mental health care is going well.  He mentions a history of EGD, which showed no Barrett's esophagus, and reports less acid reflux since a throat dilation procedure. He is scheduled for a spine MRI and has a follow-up with a neurologist in November.      {History (Optional):23778}  ROS Per HPI.    Objective:     BP 137/80   Pulse 77   Temp 98.6 F (37 C) (Oral)   Resp 16   Ht 5' 11 (1.803 m)   Wt 213 lb 3.2 oz (96.7 kg)   SpO2 98%   BMI 29.74 kg/m  {Vitals History (Optional):23777}  Physical Exam   No results found for any visits on 02/09/24.  {Labs (Optional):23779}  The 10-year ASCVD risk score (Arnett DK, et al., 2019) is: 8%    Assessment & Plan:   Simple chronic bronchitis (HCC) -     Breztri  Aerosphere; Inhale 2 puffs into the lungs 2 (two) times daily.  Dispense: 10.7 g; Refill: 11 -     Albuterol  Sulfate HFA; Inhale 2 puffs into the lungs every 6 (six) hours as needed for wheezing or shortness of breath.  Dispense: 18 g; Refill: 5  Need for shingles vaccine -     Varicella-zoster vaccine IM    Assessment and Plan    Hypertension with medication adjustment and blood pressure variability Blood pressure is variable with recent slight elevation. Currently on half dose of olmesartan  due to dizziness and syncope on full dose.  Today's reading is 117/78. Reports morning edema in ankles and fingers, resolving by noon. - Continue half tablet of olmesartan . - Bring blood pressure machine to next visit for validation. - Monitor for symptoms of hypotension and report if he occurs. - Reduce caffeine intake to two 8-ounce cups of coffee per day.  Edema of lower extremities and fingers Reports morning swelling in ankles and fingers, resolving by noon, occurring with current half dose of olmesartan .  Chronic obstructive pulmonary disease with daily inhaler initiation Reports wheezing and dyspnea on exertion.  Currently using albuterol  inhaler twice daily. Insurance may not cover preferred inhaler (Breztri ). - Prescribe Breztri  as a daily inhaler, contingent on insurance coverage. - Continue albuterol  inhaler with refills for as-needed use. - If Breztri  is not covered, contact provider for alternative inhaler.  Nicotine  dependence with ongoing cessation efforts Currently smoking half a pack per day, reduced from a pack and a half. Using lozenges for cessation support. Struggles with morning and post-meal cravings. - Reduce smoking to a quarter pack per day by next visit in 6-8 weeks. - Use lozenges to manage cravings, especially in the morning. - Consider using toothpicks or straws to manage oral fixation. - Schedule follow-up in 6-8 weeks to assess progress.  Hyperlipidemia on PCSK9 inhibitor therapy Cholesterol levels are well-controlled on Repatha . Recent CT angiogram showed nonobstructive coronary artery disease with 30% stenosis, considered moderate and preventative.  Depression under psychiatric management Under care of [facility] with recent medication adjustment including addition of Depakote . Participating in group and individual therapy. Reports management is going well.  Chronic back pain with functional limitations Experiences functional limitations due to back pain, affecting activities of daily living. Receives support from personal care services for assistance with daily tasks. - Provide documentation from previous personal care service provider for potential reinstatement of services.  General Health Maintenance Due for shingles vaccine. Discussed potential side effects including fatigue and local reaction. First dose to be administered today. - Administer first dose of shingles vaccine today. - Discuss pneumococcal vaccine at next visit.         Return in about 6 weeks (around 03/22/2024) for nicotine  replacement.    Aleksander Edmiston T Malini Flemings, PA-C

## 2024-02-10 ENCOUNTER — Other Ambulatory Visit (HOSPITAL_BASED_OUTPATIENT_CLINIC_OR_DEPARTMENT_OTHER): Payer: Self-pay

## 2024-02-11 ENCOUNTER — Encounter (HOSPITAL_BASED_OUTPATIENT_CLINIC_OR_DEPARTMENT_OTHER): Payer: Self-pay

## 2024-02-11 ENCOUNTER — Other Ambulatory Visit (HOSPITAL_BASED_OUTPATIENT_CLINIC_OR_DEPARTMENT_OTHER): Payer: Self-pay

## 2024-02-15 ENCOUNTER — Other Ambulatory Visit (HOSPITAL_BASED_OUTPATIENT_CLINIC_OR_DEPARTMENT_OTHER): Payer: Self-pay

## 2024-02-16 ENCOUNTER — Other Ambulatory Visit (HOSPITAL_BASED_OUTPATIENT_CLINIC_OR_DEPARTMENT_OTHER): Payer: Self-pay

## 2024-02-16 ENCOUNTER — Telehealth (HOSPITAL_BASED_OUTPATIENT_CLINIC_OR_DEPARTMENT_OTHER): Payer: Self-pay

## 2024-02-16 ENCOUNTER — Other Ambulatory Visit (HOSPITAL_BASED_OUTPATIENT_CLINIC_OR_DEPARTMENT_OTHER): Payer: Self-pay | Admitting: Student

## 2024-02-16 ENCOUNTER — Other Ambulatory Visit: Payer: Self-pay

## 2024-02-16 DIAGNOSIS — J410 Simple chronic bronchitis: Secondary | ICD-10-CM

## 2024-02-16 MED ORDER — MOMETASONE FURO-FORMOTEROL FUM 100-5 MCG/ACT IN AERO
2.0000 | INHALATION_SPRAY | Freq: Two times a day (BID) | RESPIRATORY_TRACT | 3 refills | Status: DC
  Filled 2024-02-16: qty 13, 30d supply, fill #0
  Filled 2024-04-04: qty 13, 30d supply, fill #1
  Filled 2024-05-01: qty 13, 30d supply, fill #2
  Filled 2024-05-29: qty 13, 30d supply, fill #3

## 2024-02-16 MED ORDER — TRELEGY ELLIPTA 100-62.5-25 MCG/ACT IN AEPB
1.0000 | INHALATION_SPRAY | Freq: Every day | RESPIRATORY_TRACT | 6 refills | Status: DC
  Filled 2024-02-16: qty 60, 30d supply, fill #0

## 2024-02-16 NOTE — Telephone Encounter (Signed)
 Called wife and told her breztri  was denied by medicaid. We did not get a statement of why. Leita said Lang had something else in mind if it did not work out. She does not want to call [facility] because getting through is nearly impossible.

## 2024-02-16 NOTE — Addendum Note (Signed)
 Addended by: Kerigan Narvaez on: 02/16/2024 12:45 PM   Modules accepted: Orders

## 2024-02-17 ENCOUNTER — Other Ambulatory Visit (HOSPITAL_BASED_OUTPATIENT_CLINIC_OR_DEPARTMENT_OTHER): Payer: Self-pay

## 2024-02-18 ENCOUNTER — Other Ambulatory Visit (HOSPITAL_BASED_OUTPATIENT_CLINIC_OR_DEPARTMENT_OTHER): Payer: Self-pay

## 2024-02-22 ENCOUNTER — Other Ambulatory Visit (HOSPITAL_BASED_OUTPATIENT_CLINIC_OR_DEPARTMENT_OTHER): Payer: Self-pay

## 2024-02-27 ENCOUNTER — Other Ambulatory Visit (HOSPITAL_BASED_OUTPATIENT_CLINIC_OR_DEPARTMENT_OTHER): Payer: Self-pay

## 2024-02-29 ENCOUNTER — Other Ambulatory Visit (HOSPITAL_BASED_OUTPATIENT_CLINIC_OR_DEPARTMENT_OTHER): Payer: Self-pay

## 2024-02-29 MED ORDER — LEVOTHYROXINE SODIUM 50 MCG PO TABS
50.0000 ug | ORAL_TABLET | Freq: Every day | ORAL | 0 refills | Status: DC
  Filled 2024-02-29: qty 90, 90d supply, fill #0

## 2024-03-03 ENCOUNTER — Encounter: Payer: Self-pay | Admitting: Cardiology

## 2024-03-04 ENCOUNTER — Other Ambulatory Visit (HOSPITAL_BASED_OUTPATIENT_CLINIC_OR_DEPARTMENT_OTHER): Payer: Self-pay

## 2024-03-04 ENCOUNTER — Other Ambulatory Visit: Payer: Self-pay

## 2024-03-04 MED ORDER — OXYCODONE HCL 10 MG PO TABS
10.0000 mg | ORAL_TABLET | Freq: Four times a day (QID) | ORAL | 0 refills | Status: DC
  Filled 2024-03-04: qty 120, 30d supply, fill #0

## 2024-03-04 MED ORDER — ISOSORBIDE MONONITRATE ER 30 MG PO TB24
30.0000 mg | ORAL_TABLET | Freq: Every day | ORAL | 3 refills | Status: AC
  Filled 2024-03-04: qty 90, 90d supply, fill #0

## 2024-03-04 NOTE — Telephone Encounter (Signed)
 Pt reported Imdur  accidentally taken off med list. He was requesting refill to [facility], #[phone] ref.

## 2024-03-14 ENCOUNTER — Other Ambulatory Visit (HOSPITAL_BASED_OUTPATIENT_CLINIC_OR_DEPARTMENT_OTHER): Payer: Self-pay

## 2024-03-14 MED ORDER — MINOCYCLINE HCL 100 MG PO CAPS
100.0000 mg | ORAL_CAPSULE | Freq: Two times a day (BID) | ORAL | 2 refills | Status: AC
  Filled 2024-03-14: qty 62, 31d supply, fill #0
  Filled 2024-04-04 – 2024-04-06 (×2): qty 62, 31d supply, fill #1
  Filled 2024-05-17: qty 62, 31d supply, fill #2

## 2024-03-16 ENCOUNTER — Other Ambulatory Visit (HOSPITAL_BASED_OUTPATIENT_CLINIC_OR_DEPARTMENT_OTHER): Payer: Self-pay

## 2024-03-21 DIAGNOSIS — I671 Cerebral aneurysm, nonruptured: Secondary | ICD-10-CM | POA: Insufficient documentation

## 2024-04-01 ENCOUNTER — Other Ambulatory Visit (HOSPITAL_BASED_OUTPATIENT_CLINIC_OR_DEPARTMENT_OTHER): Payer: Self-pay

## 2024-04-04 ENCOUNTER — Other Ambulatory Visit: Payer: Self-pay

## 2024-04-04 ENCOUNTER — Other Ambulatory Visit (HOSPITAL_BASED_OUTPATIENT_CLINIC_OR_DEPARTMENT_OTHER): Payer: Self-pay

## 2024-04-04 MED ORDER — OXYCODONE HCL 10 MG PO TABS
10.0000 mg | ORAL_TABLET | Freq: Four times a day (QID) | ORAL | 0 refills | Status: DC
  Filled 2024-04-04: qty 120, 30d supply, fill #0

## 2024-04-05 ENCOUNTER — Other Ambulatory Visit (HOSPITAL_BASED_OUTPATIENT_CLINIC_OR_DEPARTMENT_OTHER): Payer: Self-pay

## 2024-04-05 NOTE — Progress Notes (Unsigned)
 Assessment/Plan:   1.  Parkinsons Disease  -***  2.  Abnormal brain scan  Patient presented after severe headache and was initially thought to have possible subarachnoid hemorrhage.  However, came to like the patient had CT myelogram 3 days prior and the abnormality seen on initial examinations were possibly from the contrast from the myelogram and not acute blood.  It was felt that the headache was a post myelogram event.  Repeat scans at [facility] apparently confirmed no subarachnoid hemorrhage.  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  Patient has since done and followed up with Dr. Jacklyn Subjective:   [name] was seen today in follow up for P parkinsonism.  My previous records were reviewed prior to todays visit as well as outside records available to me.  When seen last visit, it was felt that the patient's symptoms were likely due to Zyprexa , although he was on a high dose of Seroquel  XR in addition to the Zyprexa .  Patient also had tardive dyskinesia at the time, and he and I talked about the differences between secondary parkinsonism and tardive dyskinesia.  Patient is now off of both quetiapine  and Seroquel .  The quetiapine  was being weaned, and was last prescribed in May.  He has been placed on amantadine , 100 mg 2 times per day.  Patient was in the hospital recently at [facility].  He went to the hospital with sudden onset headache and initially felt to have a subarachnoid hemorrhage and was transferred to [facility].  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  There was a 1 to 2 mm inferiorly directed outpouching off the right paraclinoid ICA, either representing the infundibulum or a small aneurysm.  It was noted that patient had CT myelogram 3 days prior and the abnormality  seen on his initial examinations were felt to represent contrast from the myelogram and not acute blood.  It was felt that the headache was a postmyelogram event and the pseudoaneurysm and small right paraclinoid aneurysms were just incidental.  CTV and CTA were obtained at [facility] and confirmed no subarachnoid hemorrhage present.  Patient was discharged from the hospital.  He subsequently followed up with Dr. Jacklyn  Current prescribed movement disorder medications: ***   PREVIOUS MEDICATIONS: {Parkinson's RX:18200} benztropine ; amantadine ;  ALLERGIES:   Allergies  Allergen Reactions   Flomax [Tamsulosin] Other (See Comments)    Makes me pass out   Lyrica [Pregabalin] Other (See Comments)    Caused extreme depression   Statins Other (See Comments)    Rhabdomyolysis & Kidney failure    Zolpidem  Tartrate Other (See Comments)    Hallucinations and sleep walks   Demerol [Meperidine] Itching, Rash and Hives    CURRENT MEDICATIONS:  No outpatient medications have been marked as taking for the 04/07/24 encounter (Appointment) with Hawken Bielby, Asberry RAMAN, DO.     Objective:   PHYSICAL EXAMINATION:    VITALS:  There were no vitals filed for this visit.  GEN:  The patient appears stated age and is in NAD. HEENT:  Normocephalic, atraumatic.  The mucous membranes are moist. The superficial temporal arteries are without ropiness or tenderness. CV:  RRR Lungs:  CTAB Neck/HEME:  There are no carotid bruits bilaterally.  Neurological examination:  Orientation: The patient is alert and oriented x3. Cranial nerves: There is good facial  symmetry with*** facial hypomimia. The speech is fluent and clear. Soft palate rises symmetrically and there is no tongue deviation. Hearing is intact to conversational tone. Sensation: Sensation is intact to light touch throughout Motor: Strength is at least antigravity x4.  Movement examination: Tone: There is normal tone in the bilateral upper extremities.   The tone in the lower extremities is normal.  Abnormal movements: there is bilateral LE rest tremor.  No UE rest tremor.  No postural tremor.  Very min intention tremor with the fingers on the R.  There is tongue movement within the mouth, but it does not come out of the mouth. Coordination:  There is no decremation with RAM's, with any form of RAMS, including alternating supination and pronation of the forearm, hand opening and closing, finger taps, heel taps and toe taps.   I have reviewed and interpreted the following labs independently    Chemistry      Component Value Date/Time   NA 139 01/07/2024 1035   NA 137 11/11/2023 0853   K 3.5 01/07/2024 1035   CL 103 01/07/2024 1035   CO2 24 01/07/2024 1035   BUN 19 01/07/2024 1035   BUN 11 11/11/2023 0853   CREATININE 0.93 01/07/2024 1035   CREATININE 1.01 08/30/2015 1413      Component Value Date/Time   CALCIUM  8.5 01/07/2024 1035   ALKPHOS 64 01/07/2024 1035   AST 15 02/02/2024 1004   ALT 13 02/02/2024 1004   BILITOT 0.2 01/07/2024 1035   BILITOT 0.4 09/17/2023 1615       Lab Results  Component Value Date   WBC 9.9 01/07/2024   HGB 13.4 01/07/2024   HCT 38.8 (L) 01/07/2024   MCV 89.9 01/07/2024   PLT 308.0 01/07/2024    Lab Results  Component Value Date   TSH 1.370 09/17/2023     Total time spent on today's visit was ***30 minutes, including both face-to-face time and nonface-to-face time.  Time included that spent on review of records (prior notes available to me/labs/imaging if pertinent), discussing treatment and goals, answering patient's questions and coordinating care.  Cc:  Rothfuss, Jacob T, PA-C

## 2024-04-06 ENCOUNTER — Other Ambulatory Visit (HOSPITAL_BASED_OUTPATIENT_CLINIC_OR_DEPARTMENT_OTHER): Payer: Self-pay

## 2024-04-06 ENCOUNTER — Ambulatory Visit (HOSPITAL_BASED_OUTPATIENT_CLINIC_OR_DEPARTMENT_OTHER): Payer: MEDICAID | Admitting: Student

## 2024-04-07 ENCOUNTER — Ambulatory Visit (INDEPENDENT_AMBULATORY_CARE_PROVIDER_SITE_OTHER): Payer: MEDICAID | Admitting: Student

## 2024-04-07 ENCOUNTER — Ambulatory Visit: Payer: MEDICAID | Admitting: Neurology

## 2024-04-07 ENCOUNTER — Other Ambulatory Visit (HOSPITAL_BASED_OUTPATIENT_CLINIC_OR_DEPARTMENT_OTHER): Payer: Self-pay

## 2024-04-07 ENCOUNTER — Encounter (HOSPITAL_BASED_OUTPATIENT_CLINIC_OR_DEPARTMENT_OTHER): Payer: Self-pay | Admitting: Student

## 2024-04-07 VITALS — BP 111/74 | HR 115 | Temp 98.2°F | Resp 16 | Ht 71.0 in | Wt 226.8 lb

## 2024-04-07 VITALS — BP 148/87 | HR 110 | Wt 226.6 lb

## 2024-04-07 DIAGNOSIS — G8929 Other chronic pain: Secondary | ICD-10-CM

## 2024-04-07 DIAGNOSIS — R9402 Abnormal brain scan: Secondary | ICD-10-CM

## 2024-04-07 DIAGNOSIS — T43505A Adverse effect of unspecified antipsychotics and neuroleptics, initial encounter: Secondary | ICD-10-CM

## 2024-04-07 DIAGNOSIS — F17209 Nicotine dependence, unspecified, with unspecified nicotine-induced disorders: Secondary | ICD-10-CM

## 2024-04-07 DIAGNOSIS — I10 Essential (primary) hypertension: Secondary | ICD-10-CM | POA: Diagnosis not present

## 2024-04-07 DIAGNOSIS — Z79891 Long term (current) use of opiate analgesic: Secondary | ICD-10-CM

## 2024-04-07 DIAGNOSIS — G2111 Neuroleptic induced parkinsonism: Secondary | ICD-10-CM | POA: Diagnosis not present

## 2024-04-07 DIAGNOSIS — M5489 Other dorsalgia: Secondary | ICD-10-CM | POA: Diagnosis not present

## 2024-04-07 DIAGNOSIS — Z23 Encounter for immunization: Secondary | ICD-10-CM | POA: Diagnosis not present

## 2024-04-07 DIAGNOSIS — I671 Cerebral aneurysm, nonruptured: Secondary | ICD-10-CM | POA: Diagnosis not present

## 2024-04-07 DIAGNOSIS — I720 Aneurysm of carotid artery: Secondary | ICD-10-CM | POA: Insufficient documentation

## 2024-04-07 MED ORDER — METHOCARBAMOL 750 MG PO TABS
750.0000 mg | ORAL_TABLET | Freq: Four times a day (QID) | ORAL | 6 refills | Status: AC | PRN
  Filled 2024-04-07 – 2024-05-01 (×2): qty 120, 30d supply, fill #0
  Filled 2024-06-20: qty 120, 30d supply, fill #1

## 2024-04-07 MED ORDER — NALOXONE HCL 4 MG/0.1ML NA LIQD
NASAL | 3 refills | Status: AC
  Filled 2024-04-07: qty 2, 2d supply, fill #0
  Filled 2024-04-10: qty 2, 15d supply, fill #0

## 2024-04-07 NOTE — Addendum Note (Signed)
 Addended by: Krislynn Gronau on: 04/07/2024 01:05 PM   Modules accepted: Level of Service

## 2024-04-07 NOTE — Progress Notes (Signed)
 Established Patient Office Visit  Subjective   Patient ID: [name], male    DOB: May 07, 1966  Age: 58 y.o. MRN: [id number]  Chief Complaint  Patient presents with   Hospitalization Follow-up    [facility] admit dated 03/20/2024. Was dx with a cerebral aneurysm. Started with a pounded headache.    Medication Refill    Would like new rx sent in for nitroglycerin  SL.    HPI  Discussed the use of AI scribe software for clinical note transcription with the patient, who gave verbal consent to proceed.  History of Present Illness   [name] is a 58 year old male who presents for follow-up after a recent ER visit for a severe headache.  He experienced an incredibly severe headache, described as the worst of his life, prompting him to seek emergency care. A CT scan was performed, and the patient was told by the ER doctor that the results were concerning for a possible subarachnoid hemorrhage, but subsequent workup did not confirm this diagnosis. He was admitted to the neurosurgery ICU over the weekend. 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  He has a family history of aneurysms, with his grandfather having had one and his grandmother possibly having had one as well. These family members are from different sides of his family. He also mentions a family history of thyroid  cancer in a first cousin.  He is currently on several medications including Dulera  for respiratory issues, albuterol  as needed, meloxicam , amantadine , blood pressure medication, nicotine  patches and lozenges, Repatha , and Robaxin . He uses albuterol  a couple of times a day and is working on reducing his smoking, currently down to ten cigarettes a day. He has a history of pain management interventions, including spinal injections, and is under the care of a neurosurgeon for previous surgeries related to pain  management.  He has a history of thyroid  issues and is on levothyroxine . He recently received an endoscopy and is due for a colonoscopy next year. He also had an echocardiogram in June, which was reportedly normal. He is due for a follow-up with his cardiologist in March.      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   Cerebral aneurysm 03/21/2024   Anxiety    Arthritis    Bipolar affective disorder (HCC)    Coronary artery disease    DJD (degenerative joint disease)    Elevated liver function tests    Fibromyalgia    Head injury    Head injury, closed, with concussion    Inguinal hernia    Insomnia    Myofascial pain syndrome    Osteopenia    Tibial plateau fracture, left    Tremors of nervous system    Hypercalcemia 10/27/2023   Chronic nausea 10/27/2023   Statin myopathy 10/27/2023   Barrett's esophagus with dysplasia 10/27/2023   Tremor 09/15/2023   Primary osteoarthritis of left hip 03/05/2021   Primary localized osteoarthritis of right hip 03/15/2019   Primary osteoarthritis of right hip 03/15/2019   Pseudoarthrosis of cervical spine (HCC) 12/23/2017   Spinal stenosis of cervical region 09/21/2017   AKI (acute kidney injury) 08/23/2016   Transaminitis 08/23/2016   Drug overdose, multiple drugs 08/23/2016   Tachycardia 08/23/2016   Primary hypertension 08/23/2016   Accelerated hypertension 08/23/2016   Cocaine use disorder, mild, abuse (HCC)  12/26/2015   History of rhabdomyolysis 12/26/2015   Liver injury 12/26/2015   Hx of renal failure 12/26/2015   Angina, class III 08/18/2015   Family history of premature coronary artery disease 08/18/2015   Tobacco use disorder, continuous 08/18/2015   Chronic kidney disease 2017   Multiple falls 10/02/2013   Chronic back pain 10/02/2013   Spinal cord stimulator, status-post 10/02/2013   History of suicidal ideation 10/02/2013   Postural imbalance with levoscoliosis, h/o  10/02/2013   Hypothyroidism 10/02/2013   Degenerative disc disease, lumbar 09/07/2013   Leg pain 02/24/2011   Neck pain 02/24/2011   Right hip pain 02/24/2011   Irritable bowel syndrome 08/09/2008   Anxiety state 10/28/2006   DEPRESSION 10/28/2006   PAIN, CHRONIC NEC 10/28/2006   GERD 10/28/2006   Hyperlipidemia 10/28/2006   Bipolar I disorder, most recent episode (or current) manic (HCC) 10/28/2006   Past Medical History:  Diagnosis Date   Accelerated hypertension 08/23/2016   pt denies this   AKI (acute kidney injury) 08/23/2016   Angina, class III 08/18/2015   Anxiety    Anxiety state 10/28/2006   Qualifier: Diagnosis of   By: Antonio ROSALEA Rockers      IMO SNOMED Dx Update Oct 2024     Arthritis    Barrett's esophagus with dysplasia 10/27/2023   Bipolar affective disorder (HCC)    Bipolar I disorder, most recent episode (or current) manic (HCC) 10/28/2006   >>OVERVIEW FOR DISORDER, BIPOLAR NOS WRITTEN ON 07/10/2010  5:22 PM BY INTERFACE, PROBLEM LIST IN     Qualifier: Diagnosis of   By: Antonio ROSALEA Rockers         Chronic back pain    Chronic kidney disease 2017   AKF - due to Rhabdomyolysis   Chronic kidney disease 2017   AKF - due to Rhabdomyolysis   Chronic nausea 10/27/2023   Cocaine use disorder, mild, abuse (HCC) 12/26/2015   Coronary artery disease    minimal Mid LAD to Dist LAD lesion, 35% stenosed. Prox Cx lesion, 40% stenosed.   Degenerative disc disease, lumbar 09/07/2013   DJD (degenerative joint disease)    Drug overdose, multiple drugs 08/23/2016   Elevated liver function tests    Emphysema of lung (HCC)    Family history of premature coronary artery disease 08/18/2015   Fibromyalgia    GERD 10/28/2006   Qualifier: Diagnosis of   By: Antonio ROSALEA Rockers         Head injury    Head injury, closed, with concussion    x 3  from falls   History of rhabdomyolysis 12/26/2015   History of suicidal ideation 10/02/2013   2010, 2012     Hx of renal failure 12/26/2015    Hypercalcemia 10/27/2023   Hyperlipidemia    Hypothyroidism    Inguinal hernia    left   Insomnia    Irritable bowel syndrome 08/09/2008   Qualifier: Diagnosis of   By: Avram MD, NOLIA Pitts E        Leg pain 02/24/2011   Liver injury 12/26/2015   Multiple falls 10/02/2013   Myofascial pain syndrome    Neck pain 02/24/2011   Osteopenia    PAIN, CHRONIC NEC 10/28/2006   Qualifier: Diagnosis of   By: Antonio DO, Yvonne         Postural imbalance with levoscoliosis, h/o 10/02/2013   Primary hypertension 08/23/2016   Primary localized osteoarthritis of right hip 03/15/2019   Primary osteoarthritis of left hip 03/05/2021  Primary osteoarthritis of right hip 03/15/2019   Pseudoarthrosis of cervical spine (HCC) 12/23/2017   Rhabdomyolysis 08/23/2016   Right hip pain 02/24/2011   Spinal cord stimulator, status-post 10/02/2013   Per Dr. Carles 2010     Spinal stenosis of cervical region 09/21/2017   Statin myopathy 10/27/2023   Tachycardia 08/23/2016   Tibial plateau fracture, left    Tobacco use disorder, continuous 08/18/2015   Transaminitis 08/23/2016   Tremor 09/15/2023   Tremors of nervous system    Social History   Tobacco Use   Smoking status: Every Day    Current packs/day: 0.50    Average packs/day: 1 pack/day for 40.8 years (40.8 ttl pk-yrs)    Types: Cigarettes    Start date: 1985    Passive exposure: Current   Smokeless tobacco: Never  Vaping Use   Vaping status: Every Day   Substances: Nicotine   Substance Use Topics   Alcohol use: No    Alcohol/week: 0.0 standard drinks of alcohol    Comment: none since 1999- heavy drinker in past   Drug use: Not Currently    Types: Cocaine    Comment: rarely- last 2015-   Allergies  Allergen Reactions   Suvorexant      Other Reaction(s): Unknown   Flomax [Tamsulosin] Other (See Comments)    Makes me pass out   Lyrica [Pregabalin] Other (See Comments)    Caused extreme depression   Statins Other (See Comments)     Rhabdomyolysis & Kidney failure    Zolpidem  Tartrate Other (See Comments)    Hallucinations and sleep walks   Demerol [Meperidine] Itching, Rash and Hives   Mirtazapine     Other Reaction(s): Blurred VIsion      ROS Per HPI.    Objective:     BP 111/74   Pulse (!) 115   Temp 98.2 F (36.8 C) (Oral)   Resp 16   Ht 5' 11 (1.803 m)   Wt 226 lb 12.8 oz (102.9 kg)   SpO2 96%   BMI 31.63 kg/m  BP Readings from Last 3 Encounters:  04/07/24 111/74  02/09/24 137/80  02/02/24 121/79   Wt Readings from Last 3 Encounters:  04/07/24 226 lb 12.8 oz (102.9 kg)  02/09/24 213 lb 3.2 oz (96.7 kg)  02/02/24 217 lb (98.4 kg)      Physical Exam Constitutional:      General: He is not in acute distress.    Appearance: Normal appearance. He is not ill-appearing.  HENT:     Head: Normocephalic and atraumatic.     Right Ear: External ear normal.     Left Ear: External ear normal.     Nose: Nose normal.  Eyes:     Conjunctiva/sclera: Conjunctivae normal.  Cardiovascular:     Rate and Rhythm: Normal rate and regular rhythm.     Pulses: Normal pulses.     Heart sounds: Normal heart sounds. No murmur heard.    No friction rub.  Pulmonary:     Effort: Pulmonary effort is normal. No respiratory distress.     Breath sounds: Normal breath sounds. No wheezing, rhonchi or rales.  Skin:    General: Skin is warm and dry.     Coloration: Skin is not jaundiced or pale.  Neurological:     Mental Status: He is alert.  Psychiatric:        Mood and Affect: Mood normal.        Behavior: Behavior normal.  No results found for any visits on 04/07/24.  Last CBC Lab Results  Component Value Date   WBC 9.9 01/07/2024   HGB 13.4 01/07/2024   HCT 38.8 (L) 01/07/2024   MCV 89.9 01/07/2024   MCH 30.9 09/17/2023   RDW 13.1 01/07/2024   PLT 308.0 01/07/2024   Last metabolic panel Lab Results  Component Value Date   GLUCOSE 79 01/07/2024   NA 139 01/07/2024   K 3.5 01/07/2024    CL 103 01/07/2024   CO2 24 01/07/2024   BUN 19 01/07/2024   CREATININE 0.93 01/07/2024   GFR 91.55 01/07/2024   CALCIUM  8.5 01/07/2024   PHOS 6.6 (H) 09/09/2016   PROT 6.1 01/07/2024   ALBUMIN 3.8 01/07/2024   LABGLOB 3.1 09/17/2023   BILITOT 0.2 01/07/2024   ALKPHOS 64 01/07/2024   AST 15 02/02/2024   ALT 13 02/02/2024   ANIONGAP 8 05/03/2022   Last lipids Lab Results  Component Value Date   CHOL 135 02/02/2024   HDL 61 02/02/2024   LDLCALC 53 02/02/2024   TRIG 119 02/02/2024   CHOLHDL 2.2 02/02/2024   Last hemoglobin A1c Lab Results  Component Value Date   HGBA1C 5.1 09/17/2023      The 10-year ASCVD risk score (Arnett DK, et al., 2019) is: 6.6%    Assessment & Plan:   Assessment and Plan    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 Imaging confirmed aneurysm in the right internal carotid artery at ophthalmic segment of the right ICA. Family history of aneurysms noted. Neurosurgery follow-up is necessary for monitoring and potential intervention. - Follows with neurosurgery already, should discuss with them for evaluation and management of aneurysms  Nicotine  dependence Currently smoking 10 cigarettes per day. Using nicotine  patches and lozenges to aid cessation. Smoking reduced from previous higher levels. - Continue nicotine  patches and lozenges - Encouraged continued reduction in smoking  Essential hypertension Chronic, stable. BP looks fine.  - Continue current antihypertensive regimen - Follow up with Dr. MARLA for blood pressure management  Chronic obstructive pulmonary disease (COPD) COPD managed with Dulera  and albuterol . Breztri  was not obtained. Discussed potential use of Daliresp for COPD management. - Continue Dulera  and albuterol  as needed - Will consider Daliresp for COPD management if needed  Hypothyroidism Chronic, stable. Well-managed on  levothyroxine  50 mcg. No recent thyroid  storm or significant issues reported. - Continue levothyroxine  50 mcg daily  Chronic back pain - refilled robaxin   General Health Maintenance Due for second dose of shingles vaccine. Previous dose caused significant discomfort. - Administered second dose of shingles vaccine      Return in about 6 months (around 10/05/2024) for Chronic Followup.    Demi Trieu T Nelle Sayed, PA-C

## 2024-04-07 NOTE — Patient Instructions (Signed)
 It was nice to see you today!  If you have any problems before your next visit feel free to message me via MyChart (minor issues or questions) or call the office, otherwise you may reach out to schedule an office visit.  Thank you! Pau Banh, PA-C

## 2024-04-07 NOTE — Patient Instructions (Signed)
 No changes in your medication today.  The physicians and staff at [facility] are committed to providing excellent care. You may receive a survey requesting feedback about your experience at our office. We strive to receive very good responses to the survey questions. If you feel that your experience would prevent you from giving the office a very good  response, please contact our office to try to remedy the situation. We may be reached at [phone]. Thank you for taking the time out of your busy day to complete the survey.

## 2024-04-11 ENCOUNTER — Other Ambulatory Visit: Payer: Self-pay

## 2024-04-11 ENCOUNTER — Other Ambulatory Visit (HOSPITAL_BASED_OUTPATIENT_CLINIC_OR_DEPARTMENT_OTHER): Payer: Self-pay

## 2024-04-15 ENCOUNTER — Telehealth: Payer: Self-pay | Admitting: Cardiology

## 2024-04-15 NOTE — Telephone Encounter (Signed)
 Attempted to call the patient. Patient did not answer the phone. Patient's voice mail box was not set-up so unable to leave a message

## 2024-04-15 NOTE — Telephone Encounter (Signed)
 Patient is returning call.

## 2024-04-15 NOTE — Telephone Encounter (Signed)
 Pt c/o medication issue:  1. Name of Medication:   isosorbide  mononitrate (IMDUR ) 30 MG 24 hr tablet    2. How are you currently taking this medication (dosage and times per day)? As written   3. Are you having a reaction (difficulty breathing--STAT)? no  4. What is your medication issue? Urology Dr wanted to have pt stop current medication and start with another medication please advise

## 2024-04-16 ENCOUNTER — Other Ambulatory Visit (HOSPITAL_BASED_OUTPATIENT_CLINIC_OR_DEPARTMENT_OTHER): Payer: Self-pay | Admitting: Student

## 2024-04-18 ENCOUNTER — Other Ambulatory Visit: Payer: Self-pay

## 2024-04-18 ENCOUNTER — Other Ambulatory Visit (HOSPITAL_BASED_OUTPATIENT_CLINIC_OR_DEPARTMENT_OTHER): Payer: Self-pay

## 2024-04-18 MED ORDER — AMANTADINE HCL 100 MG PO CAPS
100.0000 mg | ORAL_CAPSULE | Freq: Two times a day (BID) | ORAL | 1 refills | Status: AC
  Filled 2024-04-18: qty 180, 90d supply, fill #0

## 2024-04-18 NOTE — Telephone Encounter (Signed)
 Pt states that his urologist would like to start Cialis daily for his prostate. Please advise

## 2024-04-20 ENCOUNTER — Other Ambulatory Visit (HOSPITAL_BASED_OUTPATIENT_CLINIC_OR_DEPARTMENT_OTHER): Payer: Self-pay

## 2024-05-02 ENCOUNTER — Other Ambulatory Visit: Payer: Self-pay

## 2024-05-02 ENCOUNTER — Other Ambulatory Visit (HOSPITAL_BASED_OUTPATIENT_CLINIC_OR_DEPARTMENT_OTHER): Payer: Self-pay

## 2024-05-02 MED ORDER — OXYCODONE HCL 10 MG PO TABS
10.0000 mg | ORAL_TABLET | Freq: Four times a day (QID) | ORAL | 0 refills | Status: DC
  Filled 2024-05-02: qty 120, 30d supply, fill #0

## 2024-05-03 ENCOUNTER — Other Ambulatory Visit (HOSPITAL_BASED_OUTPATIENT_CLINIC_OR_DEPARTMENT_OTHER): Payer: Self-pay

## 2024-05-03 NOTE — Telephone Encounter (Signed)
 Pt is requesting a callback regarding him wanting to know if this medication was stopped and was his urologist contacted to be made aware of it. Molly E Reissemann, MD is the urologist that would need this information. Please advise

## 2024-05-06 NOTE — H&P (Signed)
------------------------------------------------------------------------------- °  Attestation signed by Toribio Fairy Badder, MD at 05/06/2024 12:35 PM I saw and evaluated the patient, reviewed the fellow's note and updated it as appropriate. I agree with the fellow's findings and plan. The patient is appropriate for performance of the procedure in the ambulatory surgery setting.  Electronically Signed by: Toribio Badder, MD, Attending Physician 05/06/2024 12:35 PM -------------------------------------------------------------------------------  [facility]   DOS: 05/06/2024  Identification: [name] is a 58 y.o. year old male.   History of Present Illness Update from Previous Visit:  Reviewed, no changes from last visit  Overall Pain: Unchanged from previous visit  Past Medical History: Medical History[1]  Past Surgical History: Surgical History[2]  Allergies: is allergic to suvorexant , zolpidem  tartrate, pregabalin, statins-hmg-coa reductase inhibitors, tamsulosin, meperidine, and mirtazapine.  Current Medications:  See outpatient medication list  Physical Exam:  AFVSS  Chest: unlabored breathing, equal chest expansion bilaterally, normal rate Cardiac: regular rate, no jugular venous distention or peripheral edema  MSK/Neuro exam unchanged from previous visit  Diagnosis:cervical spondylosis Acute on Chronic pain : no   Impression Narrative:  No change from clinic visit  Patient Education - A description of the planned procedure was given to the patient.  The risks and benefits of the procedure were discussed with the patient and all questions were addressed to the patients satisfaction.  Risks and benefits were discussed at length with the patient. The risks included (but not only) increased pain following the procedure, failure of procedure and the fading of local anesthetic effect.  NPO guidelines  were again d/w patient.  PLAN:  Continue with planned procedure and appropriate for asc - Yes  Physician Attestation: Seen by Janaid Hameed Sheikh, DO and attending       [1] Past Medical History: Diagnosis Date   Arthritis    DDD (degenerative disc disease), cervical    Depression    Epididymitis, left    History of kidney problems    Hypercholesterolemia    Neuroleptic induced parkinsonism (CMD)    Nocturia    PVD (peripheral vascular disease)    Tardive dyskinesia    Testis pain    Thyroid  disease    Urinary hesitancy   [2] Past Surgical History: Procedure Laterality Date   CERVICAL FUSION     ACDF   HERNIA REPAIR     Procedure: HERNIA REPAIR   SPINAL CORD STIMULATOR IMPLANT     SPINAL CORD STIMULATOR IMPLANT Bilateral 11/12/2023   REVISION STIMULATOR SPINAL CORD performed by Rosine Lucas Jacklyn Mickey., MD at [facility] OR   TIBIA FRACTURE SURGERY Left    with plates and screws   TOTAL HIP ARTHROPLASTY Bilateral    TRANSURETHRAL RESECTION OF PROSTATE N/A 10/20/2023   CYSTOSCOPY WITH PROSTATE MINIMIZER performed by Geofm Almarie Barter, MD at [facility] OR

## 2024-05-09 ENCOUNTER — Other Ambulatory Visit (HOSPITAL_BASED_OUTPATIENT_CLINIC_OR_DEPARTMENT_OTHER): Payer: Self-pay

## 2024-05-11 ENCOUNTER — Emergency Department (HOSPITAL_COMMUNITY)
Admission: EM | Admit: 2024-05-11 | Discharge: 2024-05-12 | Disposition: A | Discharge status: Other Acute Inpatient Hospital | Payer: MEDICAID | Attending: Emergency Medicine | Admitting: Emergency Medicine

## 2024-05-11 ENCOUNTER — Encounter (HOSPITAL_COMMUNITY): Payer: Self-pay | Admitting: Emergency Medicine

## 2024-05-11 ENCOUNTER — Other Ambulatory Visit: Payer: Self-pay

## 2024-05-11 DIAGNOSIS — I129 Hypertensive chronic kidney disease with stage 1 through stage 4 chronic kidney disease, or unspecified chronic kidney disease: Secondary | ICD-10-CM | POA: Insufficient documentation

## 2024-05-11 DIAGNOSIS — N189 Chronic kidney disease, unspecified: Secondary | ICD-10-CM | POA: Diagnosis not present

## 2024-05-11 DIAGNOSIS — F1990 Other psychoactive substance use, unspecified, uncomplicated: Secondary | ICD-10-CM

## 2024-05-11 DIAGNOSIS — E039 Hypothyroidism, unspecified: Secondary | ICD-10-CM | POA: Insufficient documentation

## 2024-05-11 DIAGNOSIS — F319 Bipolar disorder, unspecified: Secondary | ICD-10-CM | POA: Diagnosis not present

## 2024-05-11 DIAGNOSIS — G8929 Other chronic pain: Secondary | ICD-10-CM | POA: Diagnosis not present

## 2024-05-11 DIAGNOSIS — Z79899 Other long term (current) drug therapy: Secondary | ICD-10-CM | POA: Diagnosis not present

## 2024-05-11 DIAGNOSIS — G2570 Drug induced movement disorder, unspecified: Secondary | ICD-10-CM | POA: Diagnosis not present

## 2024-05-11 DIAGNOSIS — R4689 Other symptoms and signs involving appearance and behavior: Secondary | ICD-10-CM | POA: Diagnosis present

## 2024-05-11 DIAGNOSIS — Z79890 Hormone replacement therapy (postmenopausal): Secondary | ICD-10-CM | POA: Diagnosis not present

## 2024-05-11 DIAGNOSIS — F411 Generalized anxiety disorder: Secondary | ICD-10-CM | POA: Diagnosis not present

## 2024-05-11 DIAGNOSIS — R45851 Suicidal ideations: Secondary | ICD-10-CM | POA: Diagnosis not present

## 2024-05-11 DIAGNOSIS — F1910 Other psychoactive substance abuse, uncomplicated: Secondary | ICD-10-CM | POA: Diagnosis not present

## 2024-05-11 DIAGNOSIS — Z7982 Long term (current) use of aspirin: Secondary | ICD-10-CM | POA: Diagnosis not present

## 2024-05-11 DIAGNOSIS — F17200 Nicotine dependence, unspecified, uncomplicated: Secondary | ICD-10-CM | POA: Diagnosis not present

## 2024-05-11 DIAGNOSIS — F3130 Bipolar disorder, current episode depressed, mild or moderate severity, unspecified: Secondary | ICD-10-CM | POA: Insufficient documentation

## 2024-05-11 LAB — CBC
HCT: 39.4 % (ref 39.0–52.0)
Hemoglobin: 13.8 g/dL (ref 13.0–17.0)
MCH: 30.8 pg (ref 26.0–34.0)
MCHC: 35 g/dL (ref 30.0–36.0)
MCV: 87.9 fL (ref 80.0–100.0)
Platelets: 325 K/uL (ref 150–400)
RBC: 4.48 MIL/uL (ref 4.22–5.81)
RDW: 12.5 % (ref 11.5–15.5)
WBC: 7.5 K/uL (ref 4.0–10.5)
nRBC: 0 % (ref 0.0–0.2)

## 2024-05-11 LAB — COMPREHENSIVE METABOLIC PANEL WITH GFR
ALT: 17 U/L (ref 0–44)
AST: 32 U/L (ref 15–41)
Albumin: 4.2 g/dL (ref 3.5–5.0)
Alkaline Phosphatase: 101 U/L (ref 38–126)
Anion gap: 12 (ref 5–15)
BUN: 20 mg/dL (ref 6–20)
CO2: 22 mmol/L (ref 22–32)
Calcium: 9 mg/dL (ref 8.9–10.3)
Chloride: 103 mmol/L (ref 98–111)
Creatinine, Ser: 1.17 mg/dL (ref 0.61–1.24)
GFR, Estimated: >60 mL/min (ref >60)
Glucose, Bld: 83 mg/dL (ref 70–99)
Potassium: 4 mmol/L (ref 3.5–5.1)
Sodium: 136 mmol/L (ref 135–145)
Total Bilirubin: 0.3 mg/dL (ref 0.0–1.2)
Total Protein: 6.9 g/dL (ref 6.5–8.1)

## 2024-05-11 LAB — URINE DRUG SCREEN
Amphetamines: POSITIVE — AB
Barbiturates: NEGATIVE
Benzodiazepines: NEGATIVE
Cocaine: POSITIVE — AB
Fentanyl: POSITIVE — AB
Methadone Scn, Ur: NEGATIVE
Opiates: NEGATIVE
Tetrahydrocannabinol: NEGATIVE

## 2024-05-11 LAB — ETHANOL: Alcohol, Ethyl (B): <15 mg/dL (ref <15)

## 2024-05-11 MED ORDER — ACETAMINOPHEN 325 MG PO TABS: 650.0000 mg | ORAL_TABLET | ORAL | Status: DC | PRN

## 2024-05-11 MED ORDER — FLUTICASONE FUROATE-VILANTEROL 100-25 MCG/ACT IN AEPB
1.0000 | INHALATION_SPRAY | Freq: Every day | RESPIRATORY_TRACT | Status: DC
  Filled 2024-05-11: qty 28

## 2024-05-11 MED ORDER — AMANTADINE HCL 100 MG PO CAPS
100.0000 mg | ORAL_CAPSULE | Freq: Two times a day (BID) | ORAL | Status: DC
  Administered 2024-05-11: 100 mg via ORAL
  Filled 2024-05-11: qty 1

## 2024-05-11 MED ORDER — PANTOPRAZOLE SODIUM 40 MG PO TBEC
40.0000 mg | DELAYED_RELEASE_TABLET | Freq: Every day | ORAL | Status: DC
  Administered 2024-05-11: 40 mg via ORAL
  Filled 2024-05-11: qty 1

## 2024-05-11 MED ORDER — ASPIRIN 81 MG PO TBEC
81.0000 mg | DELAYED_RELEASE_TABLET | Freq: Every day | ORAL | Status: DC
  Administered 2024-05-11: 81 mg via ORAL
  Filled 2024-05-11: qty 1

## 2024-05-11 MED ORDER — OXYCODONE HCL 5 MG PO TABS
10.0000 mg | ORAL_TABLET | Freq: Four times a day (QID) | ORAL | Status: DC
  Administered 2024-05-11 – 2024-05-12 (×2): 10 mg via ORAL
  Filled 2024-05-11 (×2): qty 2

## 2024-05-11 MED ORDER — ISOSORBIDE MONONITRATE ER 30 MG PO TB24
30.0000 mg | ORAL_TABLET | Freq: Every day | ORAL | Status: DC
  Filled 2024-05-11: qty 1

## 2024-05-11 MED ORDER — ALBUTEROL SULFATE HFA 108 (90 BASE) MCG/ACT IN AERS: 2.0000 | INHALATION_SPRAY | Freq: Four times a day (QID) | RESPIRATORY_TRACT | Status: DC | PRN

## 2024-05-11 MED ORDER — LEVOTHYROXINE SODIUM 50 MCG PO TABS
50.0000 ug | ORAL_TABLET | Freq: Every day | ORAL | Status: DC
  Administered 2024-05-11: 50 ug via ORAL
  Filled 2024-05-11: qty 1

## 2024-05-11 NOTE — ED Triage Notes (Addendum)
 Patient reports he is hearing voices and is suicidal. The voices are telling him they are out to kill him. He reports if they come through the door that he will jump out the window of his apartment. He lives on the third floor. He has been hearing voices for about a month. He has had these episodes in the past.

## 2024-05-11 NOTE — ED Notes (Signed)
 Patient wanded by security.

## 2024-05-11 NOTE — ED Notes (Signed)
 TTS screening in progress
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
## 2024-05-11 NOTE — ED Provider Notes (Signed)
 [place] EMERGENCY DEPARTMENT AT [facility] Provider Note   CSN: [id number] Arrival date & time: 05/11/24  1758     Patient presents with: Suicidal and Psychiatric Evaluation   [name] is a 58 y.o. male.   HPI Patient presents with suicidal thoughts and hallucinations.  States the voices are telling him to kill himself.  States he feels if he is going to jump out the window of his apartment.  States he lives on the third floor.  Has had these episodes before series states it got bad around Thanksgiving.  States he does drugs to help suppress the voices.  Claims compliance with his other medications.  States he is on chronic pain meds.   Past Medical History:  Diagnosis Date   Accelerated hypertension 08/23/2016   pt denies this   AKI (acute kidney injury) 08/23/2016   Angina, class III 08/18/2015   Anxiety    Anxiety state 10/28/2006   Qualifier: Diagnosis of   By: Antonio ROSALEA Rockers      IMO SNOMED Dx Update Oct 2024     Arthritis    Barrett's esophagus with dysplasia 10/27/2023   Bipolar affective disorder (HCC)    Bipolar I disorder, most recent episode (or current) manic (HCC) 10/28/2006   >>OVERVIEW FOR DISORDER, BIPOLAR NOS WRITTEN ON 07/10/2010  5:22 PM BY INTERFACE, PROBLEM LIST IN     Qualifier: Diagnosis of   By: Antonio ROSALEA Rockers         Chronic back pain    Chronic kidney disease 2017   AKF - due to Rhabdomyolysis   Chronic kidney disease 2017   AKF - due to Rhabdomyolysis   Chronic nausea 10/27/2023   Cocaine use disorder, mild, abuse (HCC) 12/26/2015   Coronary artery disease    minimal Mid LAD to Dist LAD lesion, 35% stenosed. Prox Cx lesion, 40% stenosed.   Degenerative disc disease, lumbar 09/07/2013   DJD (degenerative joint disease)    Drug overdose, multiple drugs 08/23/2016   Elevated liver function tests    Emphysema of lung (HCC)    Family history of premature coronary artery disease 08/18/2015   Fibromyalgia    GERD 10/28/2006    Qualifier: Diagnosis of   By: Antonio ROSALEA Rockers         Head injury    Head injury, closed, with concussion    x 3  from falls   History of rhabdomyolysis 12/26/2015   History of suicidal ideation 10/02/2013   2010, 2012     Hx of renal failure 12/26/2015   Hypercalcemia 10/27/2023   Hyperlipidemia    Hypothyroidism    Inguinal hernia    left   Insomnia    Irritable bowel syndrome 08/09/2008   Qualifier: Diagnosis of   By: Avram MD, NOLIA Pitts E        Leg pain 02/24/2011   Liver injury 12/26/2015   Multiple falls 10/02/2013   Myofascial pain syndrome    Neck pain 02/24/2011   Osteopenia    PAIN, CHRONIC NEC 10/28/2006   Qualifier: Diagnosis of   By: Antonio DO, Yvonne         Postural imbalance with levoscoliosis, h/o 10/02/2013   Primary hypertension 08/23/2016   Primary localized osteoarthritis of right hip 03/15/2019   Primary osteoarthritis of left hip 03/05/2021   Primary osteoarthritis of right hip 03/15/2019   Pseudoarthrosis of cervical spine (HCC) 12/23/2017   Rhabdomyolysis 08/23/2016   Right hip pain 02/24/2011  Spinal cord stimulator, status-post 10/02/2013   Per Dr. Carles 2010     Spinal stenosis of cervical region 09/21/2017   Statin myopathy 10/27/2023   Tachycardia 08/23/2016   Tibial plateau fracture, left    Tobacco use disorder, continuous 08/18/2015   Transaminitis 08/23/2016   Tremor 09/15/2023   Tremors of nervous system     Prior to Admission medications   Medication Sig Start Date End Date Taking? Authorizing Provider  acetaminophen  (TYLENOL ) 325 MG tablet Take 2 tablets (650 mg total) by mouth every 4 (four) hours as needed for mild pain (1-3) or moderate pain (4-6). 10/20/23     albuterol  (VENTOLIN  HFA) 108 (90 Base) MCG/ACT inhaler Inhale 2 puffs into the lungs every 6 (six) hours as needed for wheezing or shortness of breath. 02/09/24   Rothfuss, Jacob T, PA-C  amantadine  (SYMMETREL ) 100 MG capsule Take 1 capsule (100 mg total) by mouth 2  (two) times daily. 04/18/24   Rothfuss, Jacob T, PA-C  aspirin  EC (ASPIRIN  81) 81 MG tablet Take 1 tablet (81 mg total) by mouth daily. 10/06/23     busPIRone  (BUSPAR ) 30 MG tablet Take 30 mg by mouth in the morning and at bedtime.    [provider]  diclofenac  Sodium (VOLTAREN ) 1 % GEL Apply 2 - 4 grams to painful joints up to 4 times daily if needed. 09/15/23   Rothfuss, Jacob T, PA-C  dicyclomine  (BENTYL ) 10 MG capsule Take 1 capsule (10 mg total) by mouth every 8 (eight) hours as needed. 01/07/24   Craig Alan SAUNDERS, PA-C  docusate sodium  (COLACE) 100 MG capsule Take 1 capsule (100 mg total) by mouth 2 (two) times daily as needed for constipation. 10/20/23     ergocalciferol  (VITAMIN D2) 1.25 MG (50000 UT) capsule Take 1 capsule (50,000 Units total) by mouth once a week. 10/05/23     esomeprazole  (NEXIUM ) 40 MG capsule Take 1 capsule (40 mg total) by mouth daily. 01/07/24   Craig Alan SAUNDERS, PA-C  Evolocumab  (REPATHA  SURECLICK) 140 MG/ML SOAJ Inject 140 mg into the skin every 14 (fourteen) days. 09/15/23   Rothfuss, Jacob T, PA-C  famotidine  (PEPCID ) 20 MG tablet Take 1 tablet (20 mg total) by mouth 2 (two) times daily. 01/07/24   Craig Alan SAUNDERS, PA-C  hydrOXYzine  (ATARAX ) 25 MG tablet Take 1 tablet (25 mg total) by mouth 3 (three) times daily. 04/03/22     isosorbide  mononitrate (IMDUR ) 30 MG 24 hr tablet Take 1 tablet (30 mg total) by mouth daily. 03/04/24   Krasowski, Robert J, MD  levothyroxine  (SYNTHROID ) 50 MCG tablet Take 1 tablet (50 mcg total) by mouth daily. 02/28/24     METAMUCIL FIBER PO Take by mouth. 2 tsp daily Patient taking differently: Take by mouth as needed. 2 tsp    [provider]  methocarbamol  (ROBAXIN ) 750 MG tablet Take 1 tablet (750 mg total) by mouth 4 (four) times daily as needed. 04/07/24   Rothfuss, Jacob T, PA-C  minocycline  (MINOCIN ) 100 MG capsule Take 1 capsule (100 mg total) by mouth 2 (two) times daily. 03/14/24     mometasone -formoterol  (DULERA ) 100-5  MCG/ACT AERO Inhale 2 puffs into the lungs 2 (two) times daily. Wash mouth out with water  after use. 02/16/24   Rothfuss, Jacob T, PA-C  Multiple Vitamins-Minerals (CERTAVITE/ANTIOXIDANTS) TABS Take 1 tablet by mouth daily. 12/11/22     naloxone  (NARCAN ) nasal spray 4 mg/0.1 mL Use 1 spray in nose as directed 04/07/24   Rothfuss, Jacob T, PA-C  nicotine  (NICODERM CQ ) 21 mg/24hr patch Place 1 patch (21 mg total) onto the skin daily. 12/29/23   Rothfuss, Jacob T, PA-C  nicotine  polacrilex (NICOTINE  MINI) 2 MG lozenge Take 1 lozenge (2 mg total) by mouth as needed for smoking cessation. 01/07/24   Rothfuss, Jacob T, PA-C  OLANZapine  (ZYPREXA ) 20 MG tablet Take 20 mg by mouth at bedtime.    [provider]  olmesartan  (BENICAR ) 20 MG tablet Take 1 tablet (20 mg total) by mouth daily. Start with a half tab and increase to a full tablet if BP is >140/90. 12/29/23   Rothfuss, Jacob T, PA-C  ondansetron  (ZOFRAN ) 4 MG tablet Take 1 tablet (4 mg total) by mouth 4 (four) times daily as needed. 01/07/24   Craig Alan SAUNDERS, PA-C  Oxycodone  HCl 10 MG TABS Take 1 tablet (10 mg total) by mouth 4 (four) times daily. 05/02/24     traZODone  (DESYREL ) 100 MG tablet Take 100 mg by mouth at bedtime.    [provider]    Allergies: Suvorexant , Flomax [tamsulosin], Lyrica [pregabalin], Statins, Zolpidem  tartrate, Demerol [meperidine], and Mirtazapine    Review of Systems  Updated Vital Signs BP 123/84 (BP Location: Left Arm)   Pulse 94   Temp 98.3 F (36.8 C) (Oral)   Resp 16   SpO2 99%   Physical Exam Vitals and nursing note reviewed.  Cardiovascular:     Rate and Rhythm: Normal rate.  Skin:    Capillary Refill: Capillary refill takes less than 2 seconds.  Neurological:     Mental Status: He is alert and oriented to person, place, and time.     (all labs ordered are listed, but only abnormal results are displayed) Labs Reviewed  URINE DRUG SCREEN - Abnormal; Notable for the following  components:      Result Value   Cocaine POSITIVE (*)    Amphetamines POSITIVE (*)    Fentanyl  POSITIVE (*)    All other components within normal limits  COMPREHENSIVE METABOLIC PANEL WITH GFR  ETHANOL  CBC    EKG: None  Radiology: No results found.   Procedures   Medications Ordered in the ED - No data to display                                  Medical Decision Making Amount and/or Complexity of Data Reviewed Labs: ordered.   Patient with hallucinations and suicidal thoughts.  States he would jump out of the window.  Does have multiple substances in his urine.  At this point patient is medically cleared.  Will have patient seen by TTS.      Final diagnoses:  Suicidal ideation  Polysubstance abuse [facility])    ED Discharge Orders     None          Patsey Lot, MD 05/11/24 2009

## 2024-05-11 NOTE — Progress Notes (Addendum)
 TTS consult will be completed by IRIS. IRIS Coordinator will communicate in established secure chat assessment time and provider name. Thanks

## 2024-05-11 NOTE — BH Assessment (Deleted)
 SABRA

## 2024-05-12 ENCOUNTER — Encounter (HOSPITAL_COMMUNITY): Payer: Self-pay | Admitting: Psychiatry

## 2024-05-12 ENCOUNTER — Inpatient Hospital Stay (HOSPITAL_COMMUNITY)
Admission: AD | Admit: 2024-05-12 | Discharge: 2024-05-16 | DRG: 885 | Disposition: A | Payer: MEDICAID | Source: Intra-hospital

## 2024-05-12 DIAGNOSIS — G2570 Drug induced movement disorder, unspecified: Secondary | ICD-10-CM

## 2024-05-12 DIAGNOSIS — R45851 Suicidal ideations: Secondary | ICD-10-CM | POA: Diagnosis present

## 2024-05-12 DIAGNOSIS — Z79890 Hormone replacement therapy (postmenopausal): Secondary | ICD-10-CM | POA: Diagnosis not present

## 2024-05-12 DIAGNOSIS — F315 Bipolar disorder, current episode depressed, severe, with psychotic features: Secondary | ICD-10-CM | POA: Diagnosis not present

## 2024-05-12 DIAGNOSIS — F3130 Bipolar disorder, current episode depressed, mild or moderate severity, unspecified: Principal | ICD-10-CM | POA: Diagnosis present

## 2024-05-12 DIAGNOSIS — G8929 Other chronic pain: Secondary | ICD-10-CM | POA: Diagnosis present

## 2024-05-12 DIAGNOSIS — Z79899 Other long term (current) drug therapy: Secondary | ICD-10-CM | POA: Diagnosis not present

## 2024-05-12 DIAGNOSIS — M797 Fibromyalgia: Secondary | ICD-10-CM | POA: Diagnosis present

## 2024-05-12 DIAGNOSIS — I129 Hypertensive chronic kidney disease with stage 1 through stage 4 chronic kidney disease, or unspecified chronic kidney disease: Secondary | ICD-10-CM | POA: Diagnosis present

## 2024-05-12 DIAGNOSIS — F17290 Nicotine dependence, other tobacco product, uncomplicated: Secondary | ICD-10-CM | POA: Diagnosis present

## 2024-05-12 DIAGNOSIS — E039 Hypothyroidism, unspecified: Secondary | ICD-10-CM | POA: Diagnosis present

## 2024-05-12 DIAGNOSIS — F1910 Other psychoactive substance abuse, uncomplicated: Secondary | ICD-10-CM

## 2024-05-12 DIAGNOSIS — J439 Emphysema, unspecified: Secondary | ICD-10-CM | POA: Diagnosis present

## 2024-05-12 DIAGNOSIS — Z5941 Food insecurity: Secondary | ICD-10-CM | POA: Diagnosis not present

## 2024-05-12 DIAGNOSIS — E785 Hyperlipidemia, unspecified: Secondary | ICD-10-CM | POA: Diagnosis present

## 2024-05-12 DIAGNOSIS — Z59819 Housing instability, housed unspecified: Secondary | ICD-10-CM | POA: Diagnosis not present

## 2024-05-12 DIAGNOSIS — F17210 Nicotine dependence, cigarettes, uncomplicated: Secondary | ICD-10-CM | POA: Diagnosis present

## 2024-05-12 DIAGNOSIS — Z8249 Family history of ischemic heart disease and other diseases of the circulatory system: Secondary | ICD-10-CM | POA: Diagnosis not present

## 2024-05-12 DIAGNOSIS — F17200 Nicotine dependence, unspecified, uncomplicated: Secondary | ICD-10-CM | POA: Diagnosis not present

## 2024-05-12 DIAGNOSIS — Z833 Family history of diabetes mellitus: Secondary | ICD-10-CM | POA: Diagnosis not present

## 2024-05-12 DIAGNOSIS — Z5989 Other problems related to housing and economic circumstances: Secondary | ICD-10-CM | POA: Diagnosis not present

## 2024-05-12 DIAGNOSIS — F319 Bipolar disorder, unspecified: Secondary | ICD-10-CM | POA: Diagnosis not present

## 2024-05-12 DIAGNOSIS — I2510 Atherosclerotic heart disease of native coronary artery without angina pectoris: Secondary | ICD-10-CM | POA: Diagnosis present

## 2024-05-12 DIAGNOSIS — K5900 Constipation, unspecified: Secondary | ICD-10-CM | POA: Diagnosis present

## 2024-05-12 DIAGNOSIS — Z7982 Long term (current) use of aspirin: Secondary | ICD-10-CM | POA: Diagnosis not present

## 2024-05-12 DIAGNOSIS — Z96643 Presence of artificial hip joint, bilateral: Secondary | ICD-10-CM | POA: Diagnosis present

## 2024-05-12 DIAGNOSIS — Z7951 Long term (current) use of inhaled steroids: Secondary | ICD-10-CM | POA: Diagnosis not present

## 2024-05-12 DIAGNOSIS — F411 Generalized anxiety disorder: Secondary | ICD-10-CM | POA: Diagnosis present

## 2024-05-12 DIAGNOSIS — F19959 Other psychoactive substance use, unspecified with psychoactive substance-induced psychotic disorder, unspecified: Secondary | ICD-10-CM | POA: Diagnosis not present

## 2024-05-12 DIAGNOSIS — N189 Chronic kidney disease, unspecified: Secondary | ICD-10-CM | POA: Diagnosis present

## 2024-05-12 MED ORDER — IRBESARTAN 150 MG PO TABS
150.0000 mg | ORAL_TABLET | Freq: Every day | ORAL | Status: DC
  Administered 2024-05-12 – 2024-05-16 (×5): 150 mg via ORAL
  Filled 2024-05-12 (×5): qty 1

## 2024-05-12 MED ORDER — DICYCLOMINE HCL 10 MG PO CAPS: 10.0000 mg | ORAL_CAPSULE | Freq: Three times a day (TID) | ORAL | Status: DC | PRN

## 2024-05-12 MED ORDER — OLANZAPINE 10 MG PO TABS: 20.0000 mg | ORAL_TABLET | Freq: Every day | ORAL | Status: DC

## 2024-05-12 MED ORDER — FLUTICASONE FUROATE-VILANTEROL 100-25 MCG/ACT IN AEPB
1.0000 | INHALATION_SPRAY | Freq: Every day | RESPIRATORY_TRACT | Status: DC
  Administered 2024-05-12 – 2024-05-16 (×5): 1 via RESPIRATORY_TRACT
  Filled 2024-05-12: qty 28

## 2024-05-12 MED ORDER — OLANZAPINE 5 MG PO TBDP: 5.0000 mg | ORAL_TABLET | Freq: Three times a day (TID) | ORAL | Status: DC | PRN

## 2024-05-12 MED ORDER — OLANZAPINE 10 MG IM SOLR: 5.0000 mg | Freq: Three times a day (TID) | INTRAMUSCULAR | Status: DC | PRN

## 2024-05-12 MED ORDER — LEVOTHYROXINE SODIUM 25 MCG PO TABS
50.0000 ug | ORAL_TABLET | Freq: Every day | ORAL | Status: DC
  Administered 2024-05-12 – 2024-05-16 (×5): 50 ug via ORAL
  Filled 2024-05-12 (×5): qty 2

## 2024-05-12 MED ORDER — NICOTINE POLACRILEX 2 MG MT GUM
2.0000 mg | CHEWING_GUM | OROMUCOSAL | Status: DC | PRN
  Administered 2024-05-15 – 2024-05-16 (×6): 2 mg via ORAL
  Filled 2024-05-12 (×3): qty 1

## 2024-05-12 MED ORDER — NICOTINE POLACRILEX 2 MG MT LOZG: 2.0000 mg | LOZENGE | OROMUCOSAL | Status: DC | PRN

## 2024-05-12 MED ORDER — TRAZODONE HCL 100 MG PO TABS: 100.0000 mg | ORAL_TABLET | Freq: Every evening | ORAL | Status: DC | PRN

## 2024-05-12 MED ORDER — OXYCODONE HCL 5 MG PO TABS
10.0000 mg | ORAL_TABLET | Freq: Four times a day (QID) | ORAL | Status: DC
  Administered 2024-05-12 – 2024-05-13 (×7): 10 mg via ORAL
  Filled 2024-05-12 (×7): qty 2

## 2024-05-12 MED ORDER — ACETAMINOPHEN 325 MG PO TABS
650.0000 mg | ORAL_TABLET | ORAL | Status: DC | PRN
  Administered 2024-05-12 – 2024-05-15 (×7): 650 mg via ORAL
  Filled 2024-05-12 (×7): qty 2

## 2024-05-12 MED ORDER — DICLOFENAC SODIUM 1 % EX GEL
2.0000 g | Freq: Four times a day (QID) | CUTANEOUS | Status: DC | PRN
  Administered 2024-05-15: 2 g via TOPICAL
  Filled 2024-05-12: qty 100

## 2024-05-12 MED ORDER — AMANTADINE HCL 100 MG PO CAPS
100.0000 mg | ORAL_CAPSULE | Freq: Two times a day (BID) | ORAL | Status: DC
  Administered 2024-05-12 – 2024-05-16 (×9): 100 mg via ORAL
  Filled 2024-05-12 (×9): qty 1

## 2024-05-12 MED ORDER — ASPIRIN 81 MG PO TBEC
81.0000 mg | DELAYED_RELEASE_TABLET | Freq: Every day | ORAL | Status: DC
  Administered 2024-05-12 – 2024-05-16 (×5): 81 mg via ORAL
  Filled 2024-05-12 (×5): qty 1

## 2024-05-12 MED ORDER — ZIPRASIDONE MESYLATE 20 MG IM SOLR: 10.0000 mg | Freq: Four times a day (QID) | INTRAMUSCULAR | Status: DC | PRN

## 2024-05-12 MED ORDER — TRAZODONE HCL 100 MG PO TABS
100.0000 mg | ORAL_TABLET | Freq: Every day | ORAL | Status: DC
  Administered 2024-05-12 – 2024-05-15 (×4): 100 mg via ORAL
  Filled 2024-05-12 (×4): qty 1

## 2024-05-12 MED ORDER — ISOSORBIDE MONONITRATE ER 30 MG PO TB24
30.0000 mg | ORAL_TABLET | Freq: Every day | ORAL | Status: DC
  Administered 2024-05-12 – 2024-05-16 (×5): 30 mg via ORAL
  Filled 2024-05-12 (×5): qty 1

## 2024-05-12 MED ORDER — NICOTINE 21 MG/24HR TD PT24
21.0000 mg | MEDICATED_PATCH | Freq: Every day | TRANSDERMAL | Status: DC
  Administered 2024-05-12 – 2024-05-16 (×5): 21 mg via TRANSDERMAL
  Filled 2024-05-12 (×5): qty 1

## 2024-05-12 MED ORDER — OLANZAPINE 10 MG PO TABS
20.0000 mg | ORAL_TABLET | Freq: Every day | ORAL | Status: DC
  Administered 2024-05-12: 20 mg via ORAL
  Filled 2024-05-12: qty 2

## 2024-05-12 MED ORDER — PANTOPRAZOLE SODIUM 40 MG PO TBEC
80.0000 mg | DELAYED_RELEASE_TABLET | Freq: Every day | ORAL | Status: DC
  Administered 2024-05-12 – 2024-05-15 (×4): 80 mg via ORAL
  Filled 2024-05-12 (×4): qty 2

## 2024-05-12 MED ORDER — BUSPIRONE HCL 15 MG PO TABS
30.0000 mg | ORAL_TABLET | Freq: Two times a day (BID) | ORAL | Status: DC
  Administered 2024-05-12 – 2024-05-16 (×9): 30 mg via ORAL
  Filled 2024-05-12 (×9): qty 2

## 2024-05-12 MED ORDER — HYDROXYZINE HCL 25 MG PO TABS
25.0000 mg | ORAL_TABLET | ORAL | Status: DC | PRN
  Administered 2024-05-12 – 2024-05-13 (×4): 25 mg via ORAL
  Filled 2024-05-12 (×4): qty 1

## 2024-05-12 MED ORDER — HYDROXYZINE HCL 25 MG PO TABS: 50.0000 mg | ORAL_TABLET | Freq: Three times a day (TID) | ORAL | Status: DC | PRN

## 2024-05-12 MED ORDER — ALBUTEROL SULFATE HFA 108 (90 BASE) MCG/ACT IN AERS: 2.0000 | INHALATION_SPRAY | Freq: Four times a day (QID) | RESPIRATORY_TRACT | Status: DC | PRN

## 2024-05-12 MED ORDER — FAMOTIDINE 20 MG PO TABS
20.0000 mg | ORAL_TABLET | Freq: Two times a day (BID) | ORAL | Status: DC
  Administered 2024-05-12 – 2024-05-16 (×9): 20 mg via ORAL
  Filled 2024-05-12 (×9): qty 1

## 2024-05-12 MED ORDER — BUSPIRONE HCL 10 MG PO TABS: 10.0000 mg | ORAL_TABLET | Freq: Three times a day (TID) | ORAL | Status: DC

## 2024-05-12 MED ORDER — METHOCARBAMOL 750 MG PO TABS
750.0000 mg | ORAL_TABLET | Freq: Four times a day (QID) | ORAL | Status: DC | PRN
  Administered 2024-05-12 – 2024-05-14 (×2): 750 mg via ORAL
  Filled 2024-05-12 (×2): qty 1

## 2024-05-12 MED ORDER — ONDANSETRON HCL 4 MG PO TABS
4.0000 mg | ORAL_TABLET | Freq: Four times a day (QID) | ORAL | Status: DC | PRN
  Administered 2024-05-14: 4 mg via ORAL
  Filled 2024-05-12: qty 1

## 2024-05-12 NOTE — Group Note (Signed)
 Recreation Therapy Group Note   Group Topic:Other  Group Date: 05/12/2024 Start Time: 1305 End Time: 1350 Facilitators: Hilberto Burzynski-McCall, LRT,CTRS Location: [address] Dayroom   Activity Description/Intervention: Therapeutic Drumming. Patients with peers and staff were given the opportunity to engage in a leader facilitated [facility]RHYTH[facility] Group Empowerment Drumming Circle with staff from the [employer], in partnership with [employer]. Teaching laboratory technician and trained [employer], Norleen Mon leading with LRT observing and documenting intervention and pt response. This evidenced-based practice targets 7 areas of health and wellbeing in the human experience including: stress-reduction, exercise, self-expression, camaraderie/support, nurturing, spirituality, and music-making (leisure).   Goal Area(s) Addresses:  Patient will engage in pro-social way in music group.  Patient will follow directions of drum leader on the first prompt. Patient will demonstrate no behavioral issues during group.  Patient will identify if a reduction in stress level occurs as a result of participation in therapeutic drum circle.    Education: Leisure exposure, Pharmacologist, Musical expression, Discharge Planning   Affect/Mood: N/A   Participation Level: Did not attend    Clinical Observations/Individualized Feedback:      Plan: Continue to engage patient in RT group sessions 2-3x/week.   Sameen Leas-McCall, LRT,CTRS 05/12/2024 4:02 PM

## 2024-05-12 NOTE — Group Note (Signed)
 Date:  05/12/2024 Time:  11:52 AM  Group Topic/Focus: Social Work Group    Pt did not attend social work group  Kirke Breach R Emera Bussie 05/12/2024, 11:52 AM

## 2024-05-12 NOTE — Group Note (Signed)
 Date:  05/12/2024 Time:  9:24 PM  Group Topic/Focus:  Self Care:   The focus of this group is to help patients understand the importance of self-care in order to improve or restore emotional, physical, spiritual, interpersonal, and financial health.    Pt did not attend group.  Britanny Marksberry L 05/12/2024, 9:24 PM

## 2024-05-12 NOTE — Progress Notes (Signed)
 Admission Note: report received from [facility] patient is a .. VOL year old male from [place] Pt was calm and cooperative during assessment. Denies SI/HI/AVH. Admission plan of care reviewed with pt, consent signed.  Personal belongings/skin assessment completed.   No contraband found.  Patient oriented to the unit, staff and room.  Routine safety checks initiated.  Verbalizes understanding of unit rules/protocols.   Patient is presently safe on the unit. No unsafe behaviors noted.  Q 15 minute safety checks maintained per unit protocol.

## 2024-05-12 NOTE — Progress Notes (Signed)
 Tour of Duty:  Prentice JINNY Angle, RN, 05/12/2024, Tour of Duty: 0700-1900  SI/HI/AVH: Denies  Self-Reported   Mood: Neutral  Anxiety: Denies, but Observable Depression: Denies Irritability: Denies  Broset  Violence Prevention Guidelines *See Row Information*: (not recorded)   LBM  Last BM Date : 05/11/24   Pain: present, PRN provided (see MAR)  Patient Refusals (including Rx): No  >>Shift Summary: Patient observed to be mildly anxious on unit. Patient able to make needs known. Patient observed to engage appropriately with staff and peers. Patient taking medications as prescribed. This shift, PRN medication requested or required. No reported or observed side effects to medication. No reported or observed agitation, aggression, or other acute emotional distress. Patient reports severe chronic pain, scheduled and PRN Rx administered. No additional reported or observed physical abnormalities or concerns.  Last Vitals  Vitals Weight: 99.6 kg Temp: 99.2 F (37.3 C) Temp Source: Oral Pulse Rate: 79 Resp: 16 BP: 100/69 Patient Position: (not recorded)  Admission Type  Psych Admission Type (Psych Patients Only) Admission Status: Voluntary Date 72 hour document signed : (not recorded) Time 72 hour document signed : (not recorded) Provider Notified (First and Last Name) (see details for LINK to note): (not recorded)   Psychosocial Assessment  Psychosocial Assessment Patient Complaints: None Eye Contact: Fair Facial Expression: Pensive Affect: Appropriate to circumstance Speech: Logical/coherent Interaction: Cautious Motor Activity: Slow, Shuffling, Unsteady Appearance/Hygiene: Unremarkable Behavior Characteristics: Cooperative Mood: Pleasant   Aggressive Behavior  Targets: (not recorded)   Thought Process  Thought Process Coherency: Within Defined Limits Content: Within Defined Limits Delusions: None reported or observed Perception: Within Defined  Limits Hallucination: None reported or observed Judgment: Limited Confusion: None  Danger to Self/Others  Danger to Self Current suicidal ideation?: Denies Description of Suicide Plan: (not recorded) Self-Injurious Behavior: (not recorded) Agreement Not to Harm Self: (not recorded) Description of Agreement: (not recorded) Danger to Others: None reported or observed

## 2024-05-12 NOTE — [facility] Group Notes (Signed)
 Brain did not attend wrap up group

## 2024-05-12 NOTE — Group Note (Signed)
 Date:  05/12/2024 Time:  2:18 PM  Group Topic/Focus: Recreational Therapy- Drumming Group    Pt did not attend recreational therapy drumming group.   Brylea Pita R Jakyle Petrucelli 05/12/2024, 2:18 PM

## 2024-05-12 NOTE — Plan of Care (Signed)
   Problem: Education: Goal: Emotional status will improve Outcome: Progressing Goal: Mental status will improve Outcome: Progressing

## 2024-05-12 NOTE — Group Note (Signed)
 Therapy Group Note  Group Topic:Other  Group Date: 05/12/2024 Start Time: 1500 End Time: 1530 Facilitators: Leanndra Pember G, OT   The primary objective of this topic is to explore and understand the concept of occupational balance in the context of daily living. The term occupational balance is defined broadly, encompassing all activities that occupy an individual's time and energy, including self-care, leisure, and work-related tasks. The goal is to guide participants towards achieving a harmonious blend of these activities, tailored to their personal values and life circumstances. This balance is aimed at enhancing overall well-being, not by equally distributing time across activities, but by ensuring that daily engagements are fulfilling and not draining. The content delves into identifying various barriers that individuals face in achieving occupational balance, such as overcommitment, misaligned priorities, external pressures, and lack of effective time management. The impact of these barriers on occupational performance, roles, and lifestyles is examined, highlighting issues like reduced efficiency, strained relationships, and potential health problems. Strategies for cultivating occupational balance are a key focus. These strategies include practical methods like time blocking, prioritizing tasks, establishing self-care rituals, decluttering, connecting with nature, and engaging in reflective practices. These approaches are designed to be adaptable and applicable to a wide range of life scenarios, promoting a proactive and mindful approach to daily living. The overall aim is to equip participants with the knowledge and tools to create a balanced lifestyle that supports their mental, emotional, and physical health, thereby improving their functional performance in daily life.      Participation Level: Did not attend                              Plan: Continue to engage patient in  OT groups 2 - 3x/week.  05/12/2024  [place] KANDICE Purpura, OT  Delisa Finck, OT
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
## 2024-05-12 NOTE — Group Note (Signed)
 Date:  05/12/2024 Time:  9:45 AM  Group Topic/Focus:  Goals Group:   The focus of this group is to help patients establish daily goals to achieve during treatment and discuss how the patient can incorporate goal setting into their daily lives to aide in recovery. Orientation:   The focus of this group is to educate the patient on the purpose and policies of crisis stabilization and provide a format to answer questions about their admission.  The group details unit policies and expectations of patients while admitted.    Participation Level:  Did Not Attend   [name] 05/12/2024, 9:45 AM

## 2024-05-12 NOTE — Group Note (Signed)
 Date:  05/12/2024 Time:  10:13 AM  Group Topic/Focus: Nutrition Group   Pt did not attend nutrition group  [name] R [name] 05/12/2024, 10:13 AM

## 2024-05-12 NOTE — Group Note (Signed)
 LCSW Group Therapy Note   Group Date: 05/12/2024 Start Time: 1100 End Time: 1200   Participation:  did not attend  Type of Therapy:  Group Therapy  Topic: Healing Flames: Navigating Anger with Compassion  Objective:  Foster self-awareness and promote compassion toward oneself and others when dealing with anger.  Goals:  Help participants understand the underlying emotions and needs fueling anger. Provide coping strategies for healthier emotional expression and anger management.  Summary: This session explored anger as a volcano--an explosion driven by deeper feelings and unmet needs. Participants learned to identify anger triggers and underlying emotions, then practiced coping strategies like deep breathing, physical activity, and journaling. The group discussed healthy ways to manage anger before it escalates, using both personal reflection and shared experiences.  Therapeutic Modalities: Cognitive Behavioral Therapy (CBT): Challenging thoughts that fuel anger. Mindfulness: Increasing awareness of emotions and sensations.   Chantale Leugers O Rahim Astorga, LCSWA 05/12/2024  12:15 PM

## 2024-05-12 NOTE — B[facility] Suicide Risk Assessment (Cosign Needed)
 [facility] Admission Suicide Risk Assessment   Nursing information obtained from:    Demographic factors:  Caucasian Current Mental Status:  Suicidal ideation indicated by patient Loss Factors:  NA Historical Factors:  Impulsivity Risk Reduction Factors:  NA  Principal Problem: Bipolar disorder current episode depressed (HCC) Diagnosis:  Principal Problem:   Bipolar disorder current episode depressed (HCC)   Subjective Data:   HPI: [name] is a 58 y.o. male  with a past psychiatric history of Bipolar I disorder, GAD, cocaine use disorder, heroin use disorder, methamphetamine use disorder. Patient initially arrived to [facility] on 12/10 for suicidal thoughts and hallucinations, and admitted to [facility]untary on 12/10 for acute safety concerns, impaired functioning, substance related issues, and severe substance-induced psychosis or mood disturbances. PMHx is extensive and detailed below.   Continued Clinical Symptoms:  Alcohol Use Disorder Identification Test Final Score (AUDIT): 0 The Alcohol Use Disorders Identification Test, Guidelines for Use in Primary Care, Second Edition.  World Science Writer [facility]). Score between 0-7:  no or low risk or alcohol related problems. Score between 8-15:  moderate risk of alcohol related problems. Score between 16-19:  high risk of alcohol related problems. Score 20 or above:  warrants further diagnostic evaluation for alcohol dependence and treatment.   CLINICAL FACTORS:   Severe Anxiety and/or Agitation Panic Attacks Bipolar Disorder:   Depressive phase Alcohol/Substance Abuse/Dependencies Chronic Pain More than one psychiatric diagnosis Unstable or Poor Therapeutic Relationship Previous Psychiatric Diagnoses and Treatments Medical Diagnoses and Treatments/Surgeries   Physical Examination:  Physical Exam Vitals reviewed.  Constitutional:      General: He is not in acute distress.    Comments: Much older than stated age, ambling gait,  limp, chronically ill appearing  Eyes:     Extraocular Movements: Extraocular movements intact.  Pulmonary:     Effort: Pulmonary effort is normal. No respiratory distress.     Comments: Walked short distances without cane, without obvious respiratory issue Neurological:     Mental Status: He is alert.     Review of Systems  Constitutional:  Negative for chills and fever.  Gastrointestinal:  Negative for nausea and vomiting.  Musculoskeletal:  Positive for back pain, joint pain, myalgias and neck pain.  Psychiatric/Behavioral:  Positive for depression, hallucinations, substance abuse and suicidal ideas. The patient is nervous/anxious.   All other systems reviewed and are negative.  Blood pressure 100/69, pulse 79, temperature 99.2 F (37.3 C), temperature source Oral, resp. rate 16, height 5' 11 (1.803 m), weight 99.6 kg, SpO2 98%. Body mass index is 30.63 kg/m.   COGNITIVE FEATURES THAT CONTRIBUTE TO RISK:  Closed-mindedness and Loss of executive function    SUICIDE RISK:   Severe:  Frequent, intense, and enduring suicidal ideation, specific plan, no subjective intent, but some objective markers of intent (i.e., choice of lethal method), the method is accessible, some limited preparatory behavior, evidence of impaired self-control, severe dysphoria/symptomatology, multiple risk factors present, and few if any protective factors, particularly a lack of social support.  PLAN OF CARE: See H&P for assessment and plan.   I certify that inpatient services furnished can reasonably be expected to improve the patient's condition.   Morgen Linebaugh, MD 05/12/2024, 8:44 PM

## 2024-05-12 NOTE — Group Note (Signed)
 Date:  05/12/2024 Time:  3:37 PM  Group Topic/Focus: Occupational Therapy    Pt did not attend occupational therapy group  Lessie Funderburke R Madsen Riddle 05/12/2024, 3:37 PM
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
## 2024-05-13 ENCOUNTER — Encounter (HOSPITAL_COMMUNITY): Payer: Self-pay

## 2024-05-13 LAB — LIPID PANEL
Cholesterol: 150 mg/dL (ref 0–200)
HDL: 71 mg/dL (ref >40)
LDL Cholesterol: 61 mg/dL (ref 0–99)
Total CHOL/HDL Ratio: 2.1 ratio
Triglycerides: 92 mg/dL (ref <150)
VLDL: 18 mg/dL (ref 0–40)

## 2024-05-13 LAB — CBC
HCT: 40.3 % (ref 39.0–52.0)
Hemoglobin: 13.6 g/dL (ref 13.0–17.0)
MCH: 30.6 pg (ref 26.0–34.0)
MCHC: 33.7 g/dL (ref 30.0–36.0)
MCV: 90.8 fL (ref 80.0–100.0)
Platelets: 328 K/uL (ref 150–400)
RBC: 4.44 MIL/uL (ref 4.22–5.81)
RDW: 12.7 % (ref 11.5–15.5)
WBC: 7 K/uL (ref 4.0–10.5)
nRBC: 0 % (ref 0.0–0.2)

## 2024-05-13 LAB — TSH: TSH: 3.27 u[IU]/mL (ref 0.350–4.500)

## 2024-05-13 LAB — MAGNESIUM: Magnesium: 2.2 mg/dL (ref 1.7–2.4)

## 2024-05-13 LAB — HEMOGLOBIN A1C
Hgb A1c MFr Bld: 4.8 % (ref 4.8–5.6)
Mean Plasma Glucose: 91.06 mg/dL

## 2024-05-13 MED ORDER — PROMETHAZINE HCL 25 MG PO TABS: 12.5000 mg | ORAL_TABLET | Freq: Three times a day (TID) | ORAL | Status: DC | PRN

## 2024-05-13 MED ORDER — OLANZAPINE 10 MG PO TABS
20.0000 mg | ORAL_TABLET | Freq: Every day | ORAL | Status: DC
  Administered 2024-05-13 – 2024-05-15 (×3): 20 mg via ORAL
  Filled 2024-05-13 (×3): qty 2

## 2024-05-13 MED ORDER — PROMETHAZINE HCL 25 MG PO TABS
25.0000 mg | ORAL_TABLET | Freq: Once | ORAL | Status: AC
  Administered 2024-05-13: 25 mg via ORAL
  Filled 2024-05-13: qty 1

## 2024-05-13 MED ORDER — OLANZAPINE 10 MG PO TABS
10.0000 mg | ORAL_TABLET | Freq: Every day | ORAL | Status: DC
  Administered 2024-05-14 – 2024-05-16 (×3): 10 mg via ORAL
  Filled 2024-05-13 (×3): qty 1

## 2024-05-13 MED ADMIN — Polyethylene Glycol 3350 Oral Packet 17 GM: 17 g | ORAL | NDC 45802086800

## 2024-05-13 MED FILL — Polyethylene Glycol 3350 Oral Packet 17 GM: 17.0000 g | ORAL | Qty: 1 | Status: AC

## 2024-05-13 NOTE — Group Note (Signed)
 Date:  05/13/2024 Time:  7:29 PM  Group Topic/Focus: Pictionary  A mental-health-friendly Pictionary game should use calm, positive, and non-triggering prompts--such as simple objects (sun, tree, cup), self-care activities (sleeping, listening to music, gardening), and uplifting concepts (hope, balance, friendship)--to support relaxation, confidence, and social connection. Choose very easy drawings, avoid competitive pressure by removing timers, and allow teamwork or collaborative drawing to reduce anxiety. Keeping prompts gentle and familiar helps patients engage comfortably, while the creative activity itself encourages emotional expression, focus, and a sense of safety.    Participation Level:  Did Not Attend   [name] 05/13/2024, 7:29 PM

## 2024-05-13 NOTE — Group Note (Signed)
 Date:  05/13/2024 Time:  1:07 PM  Group Topic/Focus: Social wellness Wellness Toolbox:   The focus of this group is to discuss various aspects of wellness, balancing those aspects and exploring ways to increase the ability to experience wellness.  Patients will create a wellness toolbox for use upon discharge.    Participation Level:  Did Not Attend   Additional Comments:    Dolores CHRISTELLA Fredericks 05/13/2024, 1:07 PM

## 2024-05-13 NOTE — Plan of Care (Addendum)
 Pt denies SI, HI and AVH this shift when assessed. Presents with blunted affect, irritable mood and and verbally abusive when demands for immediate discharge and ED visit not met. Noted to be somatic with multiple complaints of headache from 3-8 with multiple doses of PRN tylenol  650 mg and scheduled Oxycodone  well if I go to the ED I can get better help than this. Observed resting, lightly asleep when reassessed post pain medications; reply It didn't help much. Requested for 72 hour discharge form but declined later. Tolerates meals, medications and fluids well. Emotional support, encouragement and reassurance offered. Safety maintained at Q 15 minutes intervals.    Problem: Safety: Goal: Periods of time without injury will increase Outcome: Progressing   Problem: Coping: Goal: Ability to verbalize frustrations and anger appropriately will improve Outcome: Not Progressing   Problem: Coping: Goal: Ability to demonstrate self-control will improve Outcome: Not Progressing

## 2024-05-13 NOTE — Group Note (Signed)
 Date:  05/13/2024 Time:  8:55 PM  Group Topic/Focus:  Wrap-Up Group:   The focus of this group is to help patients review their daily goal of treatment and discuss progress on daily workbooks.    Participation Level:  Did Not Attend  Participation Quality:  Resistant  Affect:  Resistant  Cognitive:  Lacking  Insight: None  Engagement in Group:  None  Modes of Intervention:  Discussion  Additional Comments:  Patient did not participate during wrap up group this evening   Bari Moats 05/13/2024, 8:55 PM

## 2024-05-13 NOTE — Progress Notes (Addendum)
 [facility] Inpatient Psychiatry Progress Note  Date: 05/13/2024 Patient: [name] MRN: [id number]   ASSESSMENT:  Erich Kochan is a 58 year old man with an extremely complicated past medical history and a diagnosis of Bipolar I who presents for suicidal ideation with mood-congruent command auditory hallucinations. Suspect this presentation is largely driven by substance use component given that his AH and depression appear to be clearing after presenting to the hospital. He is only 36+ hours from last known use of cocaine, methamphetamine and heroin. He does not appear to have a benzodiazepine use disorder or an alcohol use disorder, which makes the chances of severe, medically dangerous withdrawal minimal. Will order appropriate labs given heroin use (HIV, Hep C) and restart on his home medications. Reports consistency with these medications at home. Since he is already clearing, will simply restart home Olanzapine  20 mg qday and monitor. Very concerning in this 58 year old man with ambulatory difficulties is his gargantuan medication list which includes many of those included on the 2023 Beers criteria, including: atarax , robaxin , oxycodone . Zyprexa  is necessary for this patient's care. Will consider this hospitalization an opportunity to begin paring back certain of these, if possible.    Endorsed lingering SI without plan. Denied AVH overnight. Appears to be undergoing uncomplicated withdrawal. Minimal appetite. Receiving meds OK. Half-consumed cups of water  beside bed. He himself noted irritability at staff, including HI this morning but was able to keep himself in check.  Says that he was about to go off. Asked for ativan , discussed with patient that this was not medically indicated.   Noted swelling in bilateral ankles, perhaps trace edema. Denied CP and shortness of breath. Will continue to monitor. Receiving home oxycodone  IR 10 mg QID. Overnight Labs (CBC,  magnesium , LDL, TSH, A1c) unremarkable. Noted constipation, miralax  added. Do not see any reason to change medication regimen at this time. Have reached out to nursing to get him some of his clothing, see if his cane is here. Again denied permission to contact collateral.  Considered increasing hydroxyzine  for anxiety, decided against given patient's high fall risk and refusal of walker.   Patient later in afternoon noted slowly-worsening bitemporal headache that began in AM. This fluctuated throughout the day and was somewhat responsive to PRN tylenol  and scheduled oxycodone . From 10/10 to 5/10 back to 6/10. Vitals WNL. Also noted some stiffness in neck. Patient does not have a history of migraine. Noted spinal stenosis of cervical region. 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 CN III-VI were within normal limits. Added Promethazine  25 mg x1, seemed to help. Given no evidence of acute onset, patient was monitored. Maintained safety, fell asleep post-PRNs. Later declined 72 hour forms.    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  - Continue home Buspirone  30 mg BID - Continue home hydroxyzine  25 mg q4h PRN for anxiety   #Tobacco use disorder - Patches and gum provided   #Chronic pain - Continue home Oxycodone  10 mg QID  - Continue home robaxin  750 mg PRN for muscle spasms - Continue home bentyl  10 mg q8h PRN   #Constipation - Start miralax  daily PRN as needed   #BL trace LE edema - Continue to monitor.   #Headache - PO promethazine  25 mg x1 seemed to help - PO promethazine  12.5 mg q8h prn  Risk Assessment: Patient continues to  require inpatient hospitalization for safety and stabilization of AVH, medication withdrawal.  Discharge Planning: Barriers to Discharge: withdrawal, placement, long-term resolution of  AVH Estimated Length of Stay: 3-5 days Predicted Discharge Location: Home  INTERVAL HISTORY: HR 79. BP 100/69. No medication refusals. Tylenol  650 mg x1. Hydroxyzine  25 mg x2. Robaxin  750 mg x1. Slept 9.75 hours. No concerns overnight. Denied SI/HI/AVH.   Physical Examination:  Constitutional:      General: He is not in acute distress.    Comments: Much older than stated age, ambling gait, limp, chronically ill appearing  Eyes:     Extraocular Movements: Extraocular movements intact.  Pulmonary:     Effort: Pulmonary effort is normal. No respiratory distress.     Comments: Walked short distances without cane, without obvious respiratory issue Neurological:     Mental Status: He is alert.   MENTAL STATUS EXAM:  Appearance: older than stated age, no acute distress, tongue movements at baseline (no lower dentures, but also has TD history)  Behavior: Cooperative  Attitude: Agreeable  Speech: Normal rate, rhythm, volume  Mood: irritated  Affect: Congruent  Thought Process: LLGD   Thought Content: no delusions elicited  SI/HI: endorsed lingering SI without plan, endorsed passive HI toward staff  Perceptions: Denied AVH overnight  Judgement: Poor  Insight: Poor  Fund of Knowledge: WNL   Lab Results:  Admission on 05/12/2024  Component Date Value Ref Range Status   TSH 05/13/2024 3.270  0.350 - 4.500 uIU/mL Final   Hgb A1c MFr Bld 05/13/2024 4.8  4.8 - 5.6 % Final   Mean Plasma Glucose 05/13/2024 91.06  mg/dL Final   Cholesterol 87/87/7974 150  0 - 200 mg/dL Final   Triglycerides 87/87/7974 92  <150 mg/dL Final   HDL 87/87/7974 71  >40 mg/dL Final   Total CHOL/HDL Ratio 05/13/2024 2.1  RATIO Final   VLDL 05/13/2024 18  0 - 40 mg/dL Final   LDL Cholesterol 05/13/2024 61  0 - 99 mg/dL Final   WBC 87/87/7974 7.0  4.0 - 10.5 K/uL Final   RBC 05/13/2024 4.44  4.22 - 5.81 MIL/uL Final   Hemoglobin 05/13/2024 13.6  13.0 - 17.0 g/dL Final   HCT 87/87/7974 40.3  39.0 - 52.0 %  Final   MCV 05/13/2024 90.8  80.0 - 100.0 fL Final   MCH 05/13/2024 30.6  26.0 - 34.0 pg Final   MCHC 05/13/2024 33.7  30.0 - 36.0 g/dL Final   RDW 87/87/7974 12.7  11.5 - 15.5 % Final   Platelets 05/13/2024 328  150 - 400 K/uL Final   nRBC 05/13/2024 0.0  0.0 - 0.2 % Final   Magnesium  05/13/2024 2.2  1.7 - 2.4 mg/dL Final     Vitals: Blood pressure (!) 137/102, pulse 86, temperature 99.2 F (37.3 C), temperature source Oral, resp. rate 16, height 5' 11 (1.803 m), weight 99.6 kg, SpO2 100%.    Odis Cleveland PGY-2, Psychiatry Residency  05/13/2024, 11:24 AM

## 2024-05-13 NOTE — BHH Suicide Risk Assessment (Signed)
 B[facility] INPATIENT:  Family/Significant Other Suicide Prevention Education  Suicide Prevention Education:  Education Completed; Craig Ionescu - [phone] (wife/ex-wife),  (name of family member/significant other) has been identified by the patient as the family member/significant other with whom the patient will be residing, and identified as the person(s) who will aid the patient in the event of a mental health crisis (suicidal ideations/suicide attempt).  With written consent from the patient, the family member/significant other has been provided the following suicide prevention education, prior to the and/or following the discharge of the patient.  Leita and pt keep in contact regularly over the phone and she goes to his apartment at times. States pt has been chronically depressed for years but it always worsens this time of year.   Does not have access to weapons or firearms in his home. Pt is on and off compliant with medications. At times he will barricade himself in his room but she is always able to get inside and push the items away.   Has had 1 prior suicide attempt in the past with overdosing on medications and was on a vent for a few weeks, 1.5-2 years ago.   May be able to pick pt up at discharge depending on the day otherwise would need transportation back home.   The suicide prevention education provided includes the following: Suicide risk factors Suicide prevention and interventions National Suicide Hotline telephone number [facility] assessment telephone number [facility] Emergency Assistance 911 [facility] and/or Residential Mobile Crisis Unit telephone number  Request made of family/significant other to: Remove weapons (e.g., guns, rifles, knives), all items previously/currently identified as safety concern.   Remove drugs/medications (over-the-counter, prescriptions, illicit drugs), all items previously/currently identified as a safety concern.  The  family member/significant other verbalizes understanding of the suicide prevention education information provided.  The family member/significant other agrees to remove the items of safety concern listed above.  [name] 05/13/2024, 3:30 PM

## 2024-05-13 NOTE — BH IP Treatment Plan (Signed)
 Interdisciplinary Treatment and Diagnostic Plan Update  05/13/2024 Time of Session: 10:50 AM [name] MRN: [id number]  Principal Diagnosis: Bipolar disorder current episode depressed (HCC)  Secondary Diagnoses: Principal Problem:   Bipolar disorder current episode depressed (HCC)   Current Medications:  Current Facility-Administered Medications  Medication Dose Route Frequency Provider Last Rate Last Admin   acetaminophen  (TYLENOL ) tablet 650 mg  650 mg Oral Q4H PRN Rollene Katz, MD   650 mg at 05/13/24 1302   albuterol  (VENTOLIN  HFA) 108 (90 Base) MCG/ACT inhaler 2 puff  2 puff Inhalation Q6H PRN Rollene Katz, MD       amantadine  (SYMMETREL ) capsule 100 mg  100 mg Oral BID Rollene Katz, MD   100 mg at 05/13/24 1707   aspirin  EC tablet 81 mg  81 mg Oral Daily Rollene Katz, MD   81 mg at 05/13/24 9177   busPIRone  (BUSPAR ) tablet 30 mg  30 mg Oral BID Rollene Katz, MD   30 mg at 05/13/24 1705   diclofenac  Sodium (VOLTAREN ) 1 % topical gel 2 g  2 g Topical QID PRN Rollene Katz, MD       dicyclomine  (BENTYL ) capsule 10 mg  10 mg Oral Q8H PRN Rollene Katz, MD       famotidine  (PEPCID ) tablet 20 mg  20 mg Oral BID Rollene Katz, MD   20 mg at 05/13/24 1707   fluticasone  furoate-vilanterol (BREO ELLIPTA ) 100-25 MCG/ACT 1 puff  1 puff Inhalation Daily Rollene Katz, MD   1 puff at 05/13/24 9191   hydrOXYzine  (ATARAX ) tablet 25 mg  25 mg Oral Q4H PRN Prentis Kitchens A, DO   25 mg at 05/13/24 1302   irbesartan (AVAPRO) tablet 150 mg  150 mg Oral Daily Rollene Katz, MD   150 mg at 05/13/24 9176   isosorbide  mononitrate (IMDUR ) 24 hr tablet 30 mg  30 mg Oral Daily Rollene Katz, MD   30 mg at 05/13/24 9176   levothyroxine  (SYNTHROID ) tablet 50 mcg  50 mcg Oral Daily Rollene Katz, MD   50 mcg at 05/13/24 9176   methocarbamol  (ROBAXIN ) tablet 750 mg  750 mg Oral QID PRN Rollene Katz, MD   750 mg at 05/12/24 1108   nicotine   (NICODERM CQ  - dosed in mg/24 hours) patch 21 mg  21 mg Transdermal Daily Rollene Katz, MD   21 mg at 05/13/24 9175   nicotine  polacrilex (NICORETTE ) gum 2 mg  2 mg Oral PRN Prentis Kitchens A, DO       OLANZapine  (ZYPREXA ) injection 5 mg  5 mg Intramuscular TID PRN Onuoha, Chinwendu V, NP       OLANZapine  (ZYPREXA ) tablet 20 mg  20 mg Oral QHS Rollene Katz, MD       And   NOREEN ON 05/14/2024] OLANZapine  (ZYPREXA ) tablet 10 mg  10 mg Oral Daily Rollene Katz, MD       OLANZapine  zydis (ZYPREXA ) disintegrating tablet 5 mg  5 mg Oral TID PRN Onuoha, Chinwendu V, NP       ondansetron  (ZOFRAN ) tablet 4 mg  4 mg Oral QID PRN Rollene Katz, MD       oxyCODONE  (Oxy IR/ROXICODONE ) immediate release tablet 10 mg  10 mg Oral QID Rollene Katz, MD   10 mg at 05/13/24 1704   pantoprazole  (PROTONIX ) EC tablet 80 mg  80 mg Oral Q1200 Rollene Katz, MD   80 mg at 05/13/24 1149   polyethylene glycol (MIRALAX  / GLYCOLAX ) packet 17 g  17 g Oral Daily  PRN Rollene Katz, MD   17 g at 05/13/24 1300   traZODone  (DESYREL ) tablet 100 mg  100 mg Oral QHS Crawford, Benjamin, MD   100 mg at 05/12/24 2056   PTA Medications: Medications Prior to Admission  Medication Sig Dispense Refill Last Dose/Taking   acetaminophen  (TYLENOL ) 325 MG tablet Take 2 tablets (650 mg total) by mouth every 4 (four) hours as needed for mild pain (1-3) or moderate pain (4-6). 60 tablet 1    albuterol  (VENTOLIN  HFA) 108 (90 Base) MCG/ACT inhaler Inhale 2 puffs into the lungs every 6 (six) hours as needed for wheezing or shortness of breath. 18 g 5    amantadine  (SYMMETREL ) 100 MG capsule Take 1 capsule (100 mg total) by mouth 2 (two) times daily. 180 capsule 1    aspirin  EC (ASPIRIN  81) 81 MG tablet Take 1 tablet (81 mg total) by mouth daily. 90 tablet 4    busPIRone  (BUSPAR ) 30 MG tablet Take 30 mg by mouth in the morning and at bedtime.      diclofenac  Sodium (VOLTAREN ) 1 % GEL Apply 2 - 4 grams to painful  joints up to 4 times daily if needed. 350 g 5    dicyclomine  (BENTYL ) 10 MG capsule Take 1 capsule (10 mg total) by mouth every 8 (eight) hours as needed. 270 capsule 1    docusate sodium  (COLACE) 100 MG capsule Take 1 capsule (100 mg total) by mouth 2 (two) times daily as needed for constipation. 20 capsule 0    ergocalciferol  (VITAMIN D2) 1.25 MG (50000 UT) capsule Take 1 capsule (50,000 Units total) by mouth once a week. 4 capsule 6    esomeprazole  (NEXIUM ) 40 MG capsule Take 1 capsule (40 mg total) by mouth daily. 90 capsule 1    Evolocumab  (REPATHA  SURECLICK) 140 MG/ML SOAJ Inject 140 mg into the skin every 14 (fourteen) days. 2 mL 12    famotidine  (PEPCID ) 20 MG tablet Take 1 tablet (20 mg total) by mouth 2 (two) times daily. 180 tablet 1    hydrOXYzine  (ATARAX ) 25 MG tablet Take 1 tablet (25 mg total) by mouth 3 (three) times daily. 270 tablet 0    isosorbide  mononitrate (IMDUR ) 30 MG 24 hr tablet Take 1 tablet (30 mg total) by mouth daily. 90 tablet 3    levothyroxine  (SYNTHROID ) 50 MCG tablet Take 1 tablet (50 mcg total) by mouth daily. 90 tablet 0    METAMUCIL FIBER PO Take by mouth. 2 tsp daily (Patient taking differently: Take by mouth as needed. 2 tsp)      methocarbamol  (ROBAXIN ) 750 MG tablet Take 1 tablet (750 mg total) by mouth 4 (four) times daily as needed. 120 tablet 6    minocycline  (MINOCIN ) 100 MG capsule Take 1 capsule (100 mg total) by mouth 2 (two) times daily. 62 capsule 2    mometasone -formoterol  (DULERA ) 100-5 MCG/ACT AERO Inhale 2 puffs into the lungs 2 (two) times daily. Wash mouth out with water  after use. 13 g 3    Multiple Vitamins-Minerals (CERTAVITE/ANTIOXIDANTS) TABS Take 1 tablet by mouth daily. 30 tablet 0    naloxone  (NARCAN ) nasal spray 4 mg/0.1 mL Use 1 spray in nose as directed 2 each 3    nicotine  (NICODERM CQ ) 21 mg/24hr patch Place 1 patch (21 mg total) onto the skin daily. 28 patch 2    nicotine  polacrilex (NICOTINE  MINI) 2 MG lozenge Take 1 lozenge (2  mg total) by mouth as needed for smoking cessation. 72 tablet 8  OLANZapine  (ZYPREXA ) 20 MG tablet Take 20 mg by mouth at bedtime.      olmesartan  (BENICAR ) 20 MG tablet Take 1 tablet (20 mg total) by mouth daily. Start with a half tab and increase to a full tablet if BP is >140/90. 30 tablet 3    ondansetron  (ZOFRAN ) 4 MG tablet Take 1 tablet (4 mg total) by mouth 4 (four) times daily as needed. 120 tablet 3    Oxycodone  HCl 10 MG TABS Take 1 tablet (10 mg total) by mouth 4 (four) times daily. 120 tablet 0    traZODone  (DESYREL ) 100 MG tablet Take 100 mg by mouth at bedtime.       Patient Stressors:    Patient Strengths:    Treatment Modalities: Medication Management, Group therapy, Case management,  1 to 1 session with clinician, Psychoeducation, Recreational therapy.   Physician Treatment Plan for Primary Diagnosis: Bipolar disorder current episode depressed (HCC) Long Term Goal(s):     Short Term Goals: Ability to identify changes in lifestyle to reduce recurrence of condition will improve Ability to verbalize feelings will improve Ability to disclose and discuss suicidal ideas Ability to demonstrate self-control will improve  Medication Management: Evaluate patient's response, side effects, and tolerance of medication regimen.  Therapeutic Interventions: 1 to 1 sessions, Unit Group sessions and Medication administration.  Evaluation of Outcomes: Not Progressing  Physician Treatment Plan for Secondary Diagnosis: Principal Problem:   Bipolar disorder current episode depressed (HCC)  Long Term Goal(s):     Short Term Goals: Ability to identify changes in lifestyle to reduce recurrence of condition will improve Ability to verbalize feelings will improve Ability to disclose and discuss suicidal ideas Ability to demonstrate self-control will improve     Medication Management: Evaluate patient's response, side effects, and tolerance of medication regimen.  Therapeutic  Interventions: 1 to 1 sessions, Unit Group sessions and Medication administration.  Evaluation of Outcomes: Not Progressing   RN Treatment Plan for Primary Diagnosis: Bipolar disorder current episode depressed (HCC) Long Term Goal(s): Knowledge of disease and therapeutic regimen to maintain health will improve  Short Term Goals: Ability to remain free from injury will improve, Ability to verbalize frustration and anger appropriately will improve, Ability to demonstrate self-control, Ability to participate in decision making will improve, Ability to verbalize feelings will improve, Ability to disclose and discuss suicidal ideas, Ability to identify and develop effective coping behaviors will improve, and Compliance with prescribed medications will improve  Medication Management: RN will administer medications as ordered by provider, will assess and evaluate patient's response and provide education to patient for prescribed medication. RN will report any adverse and/or side effects to prescribing provider.  Therapeutic Interventions: 1 on 1 counseling sessions, Psychoeducation, Medication administration, Evaluate responses to treatment, Monitor vital signs and CBGs as ordered, Perform/monitor CIWA, COWS, AIMS and Fall Risk screenings as ordered, Perform wound care treatments as ordered.  Evaluation of Outcomes: Not Progressing   LCSW Treatment Plan for Primary Diagnosis: Bipolar disorder current episode depressed (HCC) Long Term Goal(s): Safe transition to appropriate next level of care at discharge, Engage patient in therapeutic group addressing interpersonal concerns.  Short Term Goals: Engage patient in aftercare planning with referrals and resources, Increase social support, Increase ability to appropriately verbalize feelings, Increase emotional regulation, Facilitate acceptance of mental health diagnosis and concerns, Facilitate patient progression through stages of change regarding substance  use diagnoses and concerns, Identify triggers associated with mental health/substance abuse issues, and Increase skills for wellness and recovery  Therapeutic  Interventions: Assess for all discharge needs, 1 to 1 time with Child psychotherapist, Explore available resources and support systems, Assess for adequacy in community support network, Educate family and significant other(s) on suicide prevention, Complete Psychosocial Assessment, Interpersonal group therapy.  Evaluation of Outcomes: Not Progressing   Progress in Treatment: Attending groups: No. Participating in groups: No. Taking medication as prescribed: Yes. Toleration medication: Yes. Family/Significant other contact made: Yes, individual(s) contacted:  Norwin Aleman [phone] (wife/ex-wife) Patient understands diagnosis: No. Discussing patient identified problems/goals with staff: No. Medical problems stabilized or resolved: Yes. Denies suicidal/homicidal ideation: Yes. Issues/concerns per patient self-inventory: No.  New problem(s) identified:  No  New Short Term/Long Term Goal(s):    medication stabilization, elimination of SI thoughts, development of comprehensive mental wellness plan.    Patient Goals:  Patient reported a headache and declined to participate in the treatment team meeting.  Discharge Plan or Barriers:   Reason for Continuation of Hospitalization: Anxiety Depression Hallucinations Medication stabilization Suicidal ideation  Estimated Length of Stay:  5 - 7 days  Last 3 [place] Suicide Severity Risk Score: Flowsheet Row [facility] (Current[facility] 05/11/2024 in [facility]  C-SSRS RISK CATEGORY High Risk High Risk No Risk    Last PHQ 2/9 Scores:    04/07/2024   11:06 AM 12/29/2023    4:08 PM 09/15/2023    1:27 PM   Depression screen PHQ 2/9  Decreased Interest 1 1 1   Down, Depressed, Hopeless 1 1 1   PHQ - 2 Score 2 2 2   Altered sleeping 2 2 2   Tired, decreased energy 3 3 3   Change in appetite 0 1 2  Feeling bad or failure about yourself  1 3 3   Trouble concentrating 1 1 1   Moving slowly or fidgety/restless 3 1 0  Suicidal thoughts 0 0 1  PHQ-9 Score 12 13  14    Difficult doing work/chores Somewhat difficult Somewhat difficult Very difficult     Data saved with a previous flowsheet row definition    Scribe for Treatment Team: Crystalle Popwell O Sireen Halk, LCSWA 05/13/2024 6:45 PM

## 2024-05-13 NOTE — Progress Notes (Signed)
 Pt up to the nursing station requesting his pain medicine , pt informed that it is scheduled and he gets it 4 times a day, pt continued to yell at clinical research associate and regulatory affairs officer names. Writer tried to offer pt something else for his headache and pt walked away from the medication window cursing and field seismologist names.

## 2024-05-13 NOTE — Group Note (Signed)
 Date:  05/13/2024 Time:  4:43 PM  Group Topic/Focus:  Self Care:   The focus of this group is to help patients understand the importance of self-care and sleep hygiene in order to improve or restore emotional, physical, spiritual, interpersonal, and financial health.    Additional Comments:  patient did not attend  [name] 05/13/2024, 4:43 PM

## 2024-05-13 NOTE — Progress Notes (Signed)
°(  Sleep Hours) -9.75 (Any PRNs that were needed, meds refused, or side effects to meds)- atarax , tylenol  (Any disturbances and when (visitation, over night) (Concerns raised by the patient)- denies (SI/HI/AVH)-denies

## 2024-05-13 NOTE — Group Note (Signed)
 Date:  05/13/2024 Time:  10:29 AM  Group Topic/Focus: recreational therapy played finished the lyric  Guess the Lyrics can be a fun and effective tool for mental health and critical thinking by engaging individuals with emotional content, boosting memory, and fostering empathy. By guessing missing lyrics from songs that address themes like resilience, love, or struggle, participants can connect with their own emotions, reflect on their personal experiences, and practice cognitive flexibility. Discussing the meaning of the lyrics afterward encourages deeper self-awareness and perspective-taking, promoting mental well-being. This activity also offers a creative outlet for self-expression and can serve as a conversation starter on important topics like vulnerability, hope, and personal growth.    Participation Level:  Did Not Attend   [name] 05/13/2024, 10:29 AM

## 2024-05-13 NOTE — Group Note (Signed)
 Date:  05/13/2024 Time:  10:09 AM  Group Topic/Focus: Self assessment orientation group Goals Group:   The focus of this group is to help patients establish daily goals to achieve during treatment and discuss how the patient can incorporate goal setting into their daily lives to aide in recovery. Self Care:   The focus of this group is to help patients understand the importance of self-care in order to improve or restore emotional, physical, spiritual, interpersonal, and financial health.    Participation Level:  Did Not Attend  [name] 05/13/2024, 10:09 AM

## 2024-05-13 NOTE — Group Note (Signed)
 Recreation Therapy Group Note   Group Topic:Leisure Education  Group Date: 05/13/2024 Start Time: 0930 End Time: 1002 Facilitators: Lillien Petronio-McCall, [name],[name] Location: [address] Dayroom   Group Topic: Leisure Education   Goal Area(s) Addresses:  Patient will successfully identify positive leisure and recreation activities.  Patient will acknowledge benefits of participation in healthy leisure activities post discharge.  Patient will actively work with peers toward a shared goal.   Behavioral Response:    Intervention: Competitive Group Game   Activity: Guess the [employer]. The game is divided into 6 categories (Pop, R&B, Rock, Hip Hop, Dance and Indie). In teams of 3-4, patients took turns spinning the wheel. Whatever category the needle landed on, the group would pull a card from that stack. One of group members reads the lyric to their group. Their group has to guess the missing lyric. If they get the correct answer, they keep the card. If not, the other team gets the chance to steal the point. The team with the most cards at the end wins the game.   Education: Teacher, English As A Foreign Language, Stress Management, Discharge Planning  Education Outcome: Acknowledges education/In group clarification offered/Needs additional education   Affect/Mood: N/A   Participation Level: Did not attend    Clinical Observations/Individualized Feedback:      Plan: Continue to engage patient in RT group sessions 2-3x/week.   [name], [name],[name] 05/13/2024 12:01 PM

## 2024-05-14 DIAGNOSIS — F315 Bipolar disorder, current episode depressed, severe, with psychotic features: Secondary | ICD-10-CM

## 2024-05-14 DIAGNOSIS — K5900 Constipation, unspecified: Secondary | ICD-10-CM

## 2024-05-14 DIAGNOSIS — F17200 Nicotine dependence, unspecified, uncomplicated: Secondary | ICD-10-CM

## 2024-05-14 DIAGNOSIS — G8929 Other chronic pain: Secondary | ICD-10-CM

## 2024-05-14 DIAGNOSIS — F19959 Other psychoactive substance use, unspecified with psychoactive substance-induced psychotic disorder, unspecified: Secondary | ICD-10-CM

## 2024-05-14 MED ORDER — OXYCODONE HCL 5 MG PO TABS
10.0000 mg | ORAL_TABLET | Freq: Four times a day (QID) | ORAL | Status: DC
  Administered 2024-05-14 – 2024-05-16 (×9): 10 mg via ORAL
  Filled 2024-05-14 (×9): qty 2

## 2024-05-14 MED ORDER — ENSURE PLUS HIGH PROTEIN PO LIQD: 237.0000 mL | Freq: Three times a day (TID) | ORAL | Status: DC | PRN

## 2024-05-14 MED ORDER — METHOCARBAMOL 750 MG PO TABS
750.0000 mg | ORAL_TABLET | Freq: Four times a day (QID) | ORAL | Status: DC
  Administered 2024-05-14 – 2024-05-16 (×9): 750 mg via ORAL
  Filled 2024-05-14 (×9): qty 1

## 2024-05-14 NOTE — Progress Notes (Signed)
 Progress Note:    (Sleep Hours) - 4.50   (Any PRNs that were needed, meds refused, or side effects to meds)- None   (Any disturbances and when (visitation, over night)- None   (Concerns raised by the patient)- Preoccupied about pain management and states Nobody is listening to me, I need to go to the ER to get my back check out. Pt is irritable this morning. He refused his morning vitals. Pt was encouraged to attend groups in which he refused. Pt remains a HFR due to his unsteady gait. Will notified his provider.   (SI/HI/AVH)-  Denies SI/HI/AVH

## 2024-05-14 NOTE — Progress Notes (Signed)
 Patient displayed verbally aggressive behavior toward assigned nurse. Patient used derogatory language, including calling the nurse idiot and stupid, and directed profanity toward staff.

## 2024-05-14 NOTE — Plan of Care (Signed)
°  Problem: Safety: Goal: Periods of time without injury will increase Outcome: Progressing   Problem: Education: Goal: Emotional status will improve Outcome: Not Progressing Goal: Mental status will improve Outcome: Not Progressing   Problem: Activity: Goal: Interest or engagement in activities will improve Outcome: Not Progressing   Problem: Coping: Goal: Ability to demonstrate self-control will improve Outcome: Not Progressing

## 2024-05-14 NOTE — Group Note (Signed)
 Date:  05/14/2024 Time:  10:43 AM  Group Topic/Focus:  Social Wellness; Foster [employer], enhance communication skills, and promote a sense of belonging among participants. Through supportive group activities, discussions, and shared experiences, the group encourages healthy relationships, mutual respect, and social engagement to improve overall well-being and community connectedness.  Participation Level:  Did Not Attend   Vena Mais 05/14/2024, 10:43 AM

## 2024-05-14 NOTE — Progress Notes (Signed)
 D) Pt received calm, visible, participating in milieu, and in no acute distress. Pt A & O x4. Pt denies SI, HI, A/ V H, and depression at this time. Pt endorses anxiety and pain. Pt encouraged to drink fluids. Pt encouraged to come to staff with needs. Pt encouraged to attend and participate in groups. Pt encouraged to set reachable goals.  R) Pt remained safe on unit, in no acute distress, will continue to assess.     05/14/24 2200  Psych Admission Type (Psych Patients Only)  Admission Status Voluntary  Psychosocial Assessment  Patient Complaints Substance abuse  Eye Contact Fair  Facial Expression Anxious  Affect Anxious  Speech Logical/coherent  Interaction Avoidant  Motor Activity Slow;Unsteady  Appearance/Hygiene Disheveled  Behavior Characteristics Agitated  Mood Anxious  Thought Process  Coherency Circumstantial  Content Blaming others  Delusions Somatic  Perception WDL  Hallucination None reported or observed  Judgment Poor  Confusion WDL  Danger to Self  Current suicidal ideation? Denies  Agreement Not to Harm Self Yes  Description of Agreement berbal  Danger to Others  Danger to Others None reported or observed

## 2024-05-14 NOTE — Plan of Care (Signed)
   Problem: Education: Goal: Knowledge of [place] [facility] Education information/materials will improve Outcome: Progressing Goal: Emotional status will improve Outcome: Progressing Goal: Mental status will improve Outcome: Progressing Goal: Verbalization of understanding the information provided will improve Outcome: Progressing

## 2024-05-14 NOTE — Group Note (Signed)
 Date:  05/14/2024 Time:  11:36 AM  Group Topic/Focus:  Physical Wellness: Support participants in improving overall well-being through guided meditation practices that promote relaxation, body awareness, and stress reduction. This group provides a structured and supportive environment where individuals can learn and practice mindfulness techniques to enhance physical health, regulate the nervous system, and develop greater awareness of the mind-body connection. Through guided meditation, participants are encouraged to reduce physical tension, improve breathing, increase focus, and cultivate habits that support long-term physical and emotional wellness.     Participation Level:  Did Not Attend   Lauris JONELLE Morales 05/14/2024, 11:36 AM

## 2024-05-14 NOTE — Progress Notes (Signed)
 During 15-minute safety check, patient yelled at mental health tech, demanding that staff call 911. Patient used derogatory language toward staff, stating they were stupid and did not understand English. Tech observed patient and noted no apparent signs of  distress at that time. Tech reported patients statements and behavior to assigned nurse.

## 2024-05-14 NOTE — Progress Notes (Signed)
 Had a better day, improving in mood. Uses the walker, going to groups, joking with clinical research associate, letting staff take his vitals. Heat packs and scheduled meds appears effective for pain management.

## 2024-05-14 NOTE — Group Note (Signed)
 Date:  05/14/2024 Time:  10:09 AM  Group Topic/Focus:  Goals Group:   The focus of this group is to help patients establish daily goals to achieve during treatment and discuss how the patient can incorporate goal setting into their daily lives to aide in recovery. Orientation:   The focus of this group is to educate the patient on the purpose and policies of crisis stabilization and provide a format to answer questions about their admission.  The group details unit policies and expectations of patients while admitted.    Participation Level:  Did Not Attend    [name] 05/14/2024, 10:09 AM

## 2024-05-14 NOTE — Group Note (Signed)
 Date:  05/14/2024 Time:  9:21 PM  Group Topic/Focus:  Wrap-Up Group:   The focus of this group is to help patients review their daily goal of treatment and discuss progress on daily workbooks.    Additional Comments:  Pt was encouraged, but opted out of attending wrap up group this evening.  [name] 05/14/2024, 9:21 PM

## 2024-05-14 NOTE — Progress Notes (Signed)
 [facility] Inpatient Psychiatry Progress Note  Date: 05/14/2024 Patient: [name] MRN: [id number]  ASSESSMENT: [name] is a 58 year old man with an extremely complicated past medical history and a diagnosis of Bipolar I who presents for suicidal ideation with mood-congruent command auditory hallucinations. Suspect this presentation is largely driven by substance use component given that his AH and depression appear to be clearing after presenting to the hospital. He is only 36+ hours from last known use of cocaine, methamphetamine and heroin. He does not appear to have a benzodiazepine use disorder or an alcohol use disorder, which makes the chances of severe, medically dangerous withdrawal minimal. Will order appropriate labs given heroin use (HIV, Hep C) and restart on his home medications. Reports consistency with these medications at home. Since he is already clearing, will simply restart home Olanzapine  20 mg qday and monitor. Very concerning in this 58 year old man with ambulatory difficulties is his gargantuan medication list which includes many of those included on the 2023 Beers criteria, including: atarax , robaxin , oxycodone . Zyprexa  is necessary for this patient's care. Will consider this hospitalization an opportunity to begin paring back certain of these, if possible.    Noted swelling in bilateral ankles, perhaps trace edema. Denied CP and shortness of breath. Will continue to monitor. Receiving home oxycodone  IR 10 mg QID. Overnight Labs (CBC, magnesium , LDL, TSH, A1c) unremarkable. Noted constipation, miralax  added. Do not see any reason to change medication regimen at this time. Have reached out to nursing to get him some of his clothing, see if his cane is here. Again denied permission to contact collateral.  Considered increasing hydroxyzine  for anxiety, decided against given patient's high fall risk and refusal of walker.   12/13: Patient this morning  complaining to nurses and about nurses about his pain, no one was willing to help [me].  Required extensive redirection.  Inquiring more about his pain, discovered that he is not been getting his baclofen due to his unawareness that he needed to ask for it.  Changed it from as needed to scheduled.  Additionally discussed options for a long-acting opioid to give some coverage when he is not using the immediate release formulations.  Still very precontemplative about how opioids can impact a chronic pain picture.  Probably unsurprising.  Patient got significant benefit from heat packs.  Later seen walking around the unit.   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  - Continue home Buspirone  30 mg BID - Continue home hydroxyzine  25 mg q4h PRN for anxiety   #Tobacco use disorder - Patches and gum provided   #Chronic pain - Continue home Oxycodone  10 mg QID  - Change home robaxin  750 mg from PRN to 4 times daily for muscle spasms - Continue home bentyl  10 mg q8h PRN   #Constipation - Start miralax  daily PRN as needed   #BL trace LE edema - Continue to monitor.   #Headache - PO promethazine  25 mg x1 seemed to help - PO promethazine  12.5 mg q8h prn  Risk Assessment: Patient continues to require inpatient hospitalization for safety and stabilization of AVH, medication withdrawal.  Discharge Planning: Barriers to Discharge: withdrawal, placement, long-term resolution of AVH Estimated Length of Stay: 3-5 days Predicted Discharge Location: Home  INTERVAL HISTORY: HR 79. BP 100/69. No medication refusals. Tylenol  650 mg x1. Hydroxyzine  25 mg x2. Robaxin  750 mg x1.  Slept 9.75 hours. No concerns overnight. Denied SI/HI/AVH.   Physical Examination:  Constitutional:      General: He is not in acute distress.    Comments: Much older than stated age,  ambling gait, limp, chronically ill appearing  Eyes:     Extraocular Movements: Extraocular movements intact.  Pulmonary:     Effort: Pulmonary effort is normal. No respiratory distress.     Comments: Walked short distances without cane, without obvious respiratory issue Neurological:     Mental Status: He is alert.   MENTAL STATUS EXAM:  Appearance: older than stated age, lying in bed wearing hospital scrubs  Behavior: Attention seeking  Attitude: Irritable  Speech: Normal rate, rhythm, volume  Mood: irritated  Affect: Congruent  Thought Process: LLGD   Thought Content: no delusions elicited  SI/HI: Denied passive or active SI or HI  Perceptions: Denied AVH overnight  Judgement: Poor  Insight: Poor  Fund of Knowledge: WNL   Lab Results:  Admission on 05/12/2024  Component Date Value Ref Range Status   TSH 05/13/2024 3.270  0.350 - 4.500 uIU/mL Final   Hgb A1c MFr Bld 05/13/2024 4.8  4.8 - 5.6 % Final   Mean Plasma Glucose 05/13/2024 91.06  mg/dL Final   Cholesterol 87/87/7974 150  0 - 200 mg/dL Final   Triglycerides 87/87/7974 92  <150 mg/dL Final   HDL 87/87/7974 71  >40 mg/dL Final   Total CHOL/HDL Ratio 05/13/2024 2.1  RATIO Final   VLDL 05/13/2024 18  0 - 40 mg/dL Final   LDL Cholesterol 05/13/2024 61  0 - 99 mg/dL Final   WBC 87/87/7974 7.0  4.0 - 10.5 K/uL Final   RBC 05/13/2024 4.44  4.22 - 5.81 MIL/uL Final   Hemoglobin 05/13/2024 13.6  13.0 - 17.0 g/dL Final   HCT 87/87/7974 40.3  39.0 - 52.0 % Final   MCV 05/13/2024 90.8  80.0 - 100.0 fL Final   MCH 05/13/2024 30.6  26.0 - 34.0 pg Final   MCHC 05/13/2024 33.7  30.0 - 36.0 g/dL Final   RDW 87/87/7974 12.7  11.5 - 15.5 % Final   Platelets 05/13/2024 328  150 - 400 K/uL Final   nRBC 05/13/2024 0.0  0.0 - 0.2 % Final   Magnesium  05/13/2024 2.2  1.7 - 2.4 mg/dL Final     Vitals: Blood pressure 116/82, pulse 87, temperature 99.2 F (37.3 C), temperature source Oral, resp. rate 16, height 5' 11 (1.803 m),  weight 99.6 kg, SpO2 100%.   Signed: JINNY Morene GORMAN Delsie, MD [employer] Health Physician, PGY-2 05/14/2024 8:56 AM

## 2024-05-14 NOTE — Progress Notes (Signed)
 Everytime MHT does check pt states he needs needs 911

## 2024-05-14 NOTE — Group Note (Signed)
 Date:  05/14/2024 Time:  11:10 AM  Group Topic/Focus: Social Work Group    patient did not attend social work group  [facility] 05/14/2024, 11:10 AM

## 2024-05-14 NOTE — Progress Notes (Signed)
(  Sleep Hours) -7  (Any PRNs that were needed, meds refused, or side effects to meds)- Vistaril  25 mg  (Any disturbances and when (visitation, over night)- pt focused on his pain medication , pt refused Robaxin  and Phenergan  when offered , Charge nurse informed and talked to pt , [facility] informed and talked to pt , NP-Shalon notified about situation.  (Concerns raised by the patient)- I want to go to the ER , I want to go to the ER , I twisted my back in my bed, I need to go to the ER  pt stated he would take the Robaxin  , but stated I want to talk to the on call doctor you IDIOT , It's for my FUCKING BACK. NP Shalon on call notified of pt request. Shalon-NP stated pt took muscle relaxant , so we need to continue to monitor and inform the pt to follow up with the doctor in the morning. Pt was informed that he gets his Oxy 4 hrs apart, then 5 hrs apart and then 3 hrs apart, but at night it becomes 12 hrs 2000- 0800 , but pt did not want to hear this and continued to state he needed 911 , pt continued to talk derogatory and demand pain medication like he does every time he comes here. Pt continues to talk to writer appearing to have racial undertones in his voice .    (SI/HI/AVH)- denies

## 2024-05-14 NOTE — Group Note (Signed)
 Date:  05/14/2024 Time:  7:42 PM  Group Topic/Focus:  Documentary on [place] and it's impact on mental health.     Participation Level:  Did Not Attend   Juliene CHRISTELLA Huddle 05/14/2024, 7:42 PM

## 2024-05-14 NOTE — Progress Notes (Signed)
 Pt stated he needs 911

## 2024-05-14 NOTE — Progress Notes (Signed)
 NP Ene changed the daily schedule of the Oxy to 6 and 12 , so pt would not go 12 hours without having any medication.

## 2024-05-14 NOTE — Progress Notes (Signed)
 Spoke with Pt at Pt's request and that of his RN.  Pt wanted more pain medication but already had had all four doses of his Oxycodone .  Pt offered Robaxin , also offered Phenergan  as ordered for his headache.  Pt states I felt a disc in my back snap, I need to go to ED.  Pt laying in bed on approach, no distress visible until Pt saw this staff member.  Pt stated Ma'am I can't lay here for 8 more hours, are you refusing to give me medical care?  Reiterated to Pt that he has available, ordered options for his pain and discomfort that he can try, Pt refused at this time.  Pt speaking in normal but angry tone to this staff member.  Pt requested to see administrator, Luke, [facility] notified and speaking with Pt at this time.  Will continue to monitor with assigned staff.

## 2024-05-14 NOTE — Progress Notes (Addendum)
 Pt stated to writerI twisted my back in my bed, I need to go to the ER , I need to go to the ER, I need to go to the ER writer tried to assess pt issue, but pt continues to state he needs to go to the ER. Pt has presented with med seeking behaviors all evening , but refused Robaxin  and Phenergan  when offered. I need to see a doctor, I need to see a doctor pt was informed he could talk to the Charge Nurse , per Charge nurse pt refused Robaxin  and Phenergan  again .

## 2024-05-14 NOTE — Progress Notes (Signed)
 Pt continues to yell and curse at writer , [facility] you, I don't want to listen to you , I need 911  , pt was offered Neurotin and Ibuprofen  per [facility] and pt refused. Pt continued to yell derogatory words towards clinical research associate , cursing . Pt continued to statethe order says I can have the Oxy every 6 hours when writer informs pt that the Oxy is scheduled for 0800 , 1200 , 1700 , 2000 pt continues to yell. Writer stated that pt gets 40 mg of Oxy from 0800-2000 and 0 mg from 2000- 0800 . Writer tried to talk to pt , but pt continued to curse and talk derogatory to clinical research associate and would talk while clinical research associate was explaining things and stated he was not listening to clinical research associate.

## 2024-05-14 NOTE — Progress Notes (Signed)
 Pt refused vital signs.

## 2024-05-14 NOTE — Group Note (Signed)
 LCSW Group Therapy Note   Group Date: 05/14/2024 Start Time: 1000 End Time: 1045  Type of Therapy and Topic:  Group Therapy: Asking for Help  Participation Level:  Did Not Attend  Description of Group: This group discussed Asking for Help in their personal lives. Patients will explore why it can be difficult to ask for help re: importance, the difference between healthy and unhealthy help, and negative and postive outcomes/responses related to asking for help. Patients will be encouraged to identify current people in their own lives who they contact for help. Patients will be encouraged to explore safe and healthy ways to make their needs known to others in their lives.  Therapeutic Goals:  1.  Patient will identify areas in their life where making their needs known could be used to improve their life.  2.  Patient will identify signs/triggers that they need to seek help.  3.  Patient will demonstrate ability to communicate their needs and identify who to contact through discussion and/or role plays  Summary of Patient Progress:    Patient did not participate in group.  Therapeutic Modalities:   Cognitive Behavioral Therapy Solution-Focused Therapy   Camelia Olden, LCSW 05/14/2024  1:11 PM

## 2024-05-15 NOTE — Progress Notes (Signed)
°   05/15/24 1300  Psych Admission Type (Psych Patients Only)  Admission Status Voluntary  Psychosocial Assessment  Patient Complaints Anxiety  Eye Contact Fair  Facial Expression Anxious  Affect Anxious  Speech Logical/coherent  Interaction Assertive  Motor Activity Slow  Appearance/Hygiene Disheveled  Behavior Characteristics Anxious  Mood Anxious  Thought Process  Coherency Circumstantial  Content WDL  Delusions None reported or observed  Perception WDL  Hallucination None reported or observed  Judgment Poor  Confusion None  Danger to Self  Current suicidal ideation? Denies  Danger to Others  Danger to Others None reported or observed

## 2024-05-15 NOTE — Group Note (Signed)
 Date:  05/15/2024 Time:  7:34 PM  Group Topic/Focus:  Self Care:   The focus of this group is to help patients understand the importance of self-care in order to improve or restore emotional, physical, spiritual, interpersonal, and financial health. Time management/priority setting.    Participation Level:  Active  Participation Quality:  Appropriate  Affect:  Appropriate  Cognitive:  Appropriate  Insight: Appropriate  Engagement in Group:  Engaged  Modes of Intervention:  Discussion and Education  Additional Comments:    Juliene CHRISTELLA Huddle 05/15/2024, 7:34 PM

## 2024-05-15 NOTE — Progress Notes (Signed)
(  Sleep Hours) - 7 (Any PRNs that were needed, meds refused, or side effects to meds)- none (Any disturbances and when (visitation, over night)-none (Concerns raised by the patient)- none (SI/HI/AVH)- denies all

## 2024-05-15 NOTE — Group Note (Signed)
 Date:  05/15/2024 Time:  9:29 PM  Group Topic/Focus:  Coping With Mental Health Crisis:   The purpose of this group is to help patients identify strategies for coping with mental health crisis.  Group discusses possible causes of crisis and ways to manage them effectively.    Participation Level:  Active  Participation Quality:  Appropriate  Affect:  Appropriate  Cognitive:  Appropriate  Insight: Appropriate  Engagement in Group:  Engaged  Modes of Intervention:  Discussion  Additional Comments:    [name] L 05/15/2024, 9:29 PM

## 2024-05-15 NOTE — Progress Notes (Cosign Needed Addendum)
 [facility] Inpatient Psychiatry Progress Note  Date: 05/15/2024 Patient: [name] MRN: [id number]  ASSESSMENT: [name] is a 58 year old man with an extremely complicated past medical history and a diagnosis of Bipolar I who presents for suicidal ideation with mood-congruent command auditory hallucinations. Suspect this presentation is largely driven by substance use component given that his AH and depression appear to be clearing after presenting to the hospital. He is only 36+ hours from last known use of cocaine, methamphetamine and heroin. He does not appear to have a benzodiazepine use disorder or an alcohol use disorder, which makes the chances of severe, medically dangerous withdrawal minimal. Will order appropriate labs given heroin use (HIV, Hep C) and restart on his home medications. Reports consistency with these medications at home. Since he is already clearing, will simply restart home Olanzapine  20 mg qday and monitor. Very concerning in this 58 year old man with ambulatory difficulties is his gargantuan medication list which includes many of those included on the 2023 Beers criteria, including: atarax , robaxin , oxycodone . Zyprexa  is necessary for this patient's care. Will consider this hospitalization an opportunity to begin paring back certain of these, if possible.    Noted swelling in bilateral ankles, perhaps trace edema. Denied CP and shortness of breath. Will continue to monitor. Receiving home oxycodone  IR 10 mg QID. Overnight Labs (CBC, magnesium , LDL, TSH, A1c) unremarkable. Noted constipation, miralax  added. Do not see any reason to change medication regimen at this time. Have reached out to nursing to get him some of his clothing, see if his cane is here. Again denied permission to contact collateral.  Considered increasing hydroxyzine  for anxiety, decided against given patient's high fall risk and refusal of walker.   12/13: Patient seems  improved. Denying AH and suicidal ideation for 48 hours. Chronic pain has returned to its baseline. Amenable to increased Olanzapine  dosing. Still pre-contemplative with regard to substance use but believes he will not feel the pressure to use, now that his voices are under control. Believe a 30-day inpatient substance use treatment would be more beneficial, patient still exhibits features consistent with a pre-contemplative stage of change WRT substance use. Now in absence of SI/AH, will discharge home to apartment in [place], tomorrow vs Tuesday.    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  - Continue home Buspirone  30 mg BID - Continue home hydroxyzine  25 mg q4h PRN for anxiety   #Tobacco use disorder - Patches and gum provided   #Chronic pain - Continue home Oxycodone  10 mg QID  - Change home robaxin  750 mg from PRN to 4 times daily for muscle spasms - Continue home bentyl  10 mg q8h PRN   #Constipation - Start miralax  daily PRN as needed   #BL trace LE edema - Continue to monitor.   #Headache - PO promethazine  25 mg x1 seemed to help - PO promethazine  12.5 mg q8h prn  Risk Assessment: Patient continues to require inpatient hospitalization for safety and stabilization of AVH, medication withdrawal.  Discharge Planning: Barriers to Discharge: withdrawal, placement, long-term resolution of AVH Estimated Length of Stay: 1-2 days Predicted Discharge Location: Home  INTERVAL HISTORY: HR 87. BP 126/69. No medication refusals. Tylenol  x1. Zofran  x1. Slept 7 hours. Denied SI, HI, AVH. No concerns. Better mood as of yesterday.  Patient said he is good, if tired and ready for discharge. Eating and sleeping  better. Back pain at present is consistent with his chronic pain. Denied AH and VH for 48 hours. Denied SI and HI. No longer hearing voices,  believes voices are what lead him to drink. Discussed discharge on higher olanzapine  dosing, risk of orthostasis/weight gain. Motivational interviewing: discussed how he quit alcohol for his daughter, how his daughter might feel if he stopped other substances. Doesn't believe NA would work. Put substance use resources in AVS. Amenable to discharge tomorrow vs Tuesday.   Physical Examination:  Constitutional:      General: He is not in acute distress.    Comments: Much older than stated age, ambling gait, limp, chronically ill appearing  Eyes:     Extraocular Movements: Extraocular movements intact.  Pulmonary:     Effort: Pulmonary effort is normal. No respiratory distress.     Comments: Walked short distances without cane, without obvious respiratory issue Neurological:     Mental Status: He is alert.   MENTAL STATUS EXAM:  Appearance: older than stated age, lying in bed wearing hospital scrubs  Behavior: Attention seeking  Attitude: Irritable  Speech: Normal rate, rhythm, volume  Mood: Better  Affect: Congruent  Thought Process: LLGD   Thought Content: no delusions elicited  SI/HI: Denied passive or active SI or HI  Perceptions: Denied AVH overnight  Judgement: Poor  Insight: Poor  Fund of Knowledge: WNL   Lab Results:  Admission on 05/12/2024  Component Date Value Ref Range Status   TSH 05/13/2024 3.270  0.350 - 4.500 uIU/mL Final   Hgb A1c MFr Bld 05/13/2024 4.8  4.8 - 5.6 % Final   Mean Plasma Glucose 05/13/2024 91.06  mg/dL Final   Cholesterol 87/87/7974 150  0 - 200 mg/dL Final   Triglycerides 87/87/7974 92  <150 mg/dL Final   HDL 87/87/7974 71  >40 mg/dL Final   Total CHOL/HDL Ratio 05/13/2024 2.1  RATIO Final   VLDL 05/13/2024 18  0 - 40 mg/dL Final   LDL Cholesterol 05/13/2024 61  0 - 99 mg/dL Final   WBC 87/87/7974 7.0  4.0 - 10.5 K/uL Final   RBC 05/13/2024 4.44  4.22 - 5.81 MIL/uL Final   Hemoglobin 05/13/2024 13.6  13.0 - 17.0 g/dL Final   HCT 87/87/7974  40.3  39.0 - 52.0 % Final   MCV 05/13/2024 90.8  80.0 - 100.0 fL Final   MCH 05/13/2024 30.6  26.0 - 34.0 pg Final   MCHC 05/13/2024 33.7  30.0 - 36.0 g/dL Final   RDW 87/87/7974 12.7  11.5 - 15.5 % Final   Platelets 05/13/2024 328  150 - 400 K/uL Final   nRBC 05/13/2024 0.0  0.0 - 0.2 % Final   Magnesium  05/13/2024 2.2  1.7 - 2.4 mg/dL Final     Vitals: Blood pressure 114/78, pulse 84, temperature 99.2 F (37.3 C), temperature source Oral, resp. rate 20, height 5' 11 (1.803 m), weight 99.6 kg, SpO2 100%.   Signed: Morene Cleveland, MD [facility] Physician, PGY-2 05/15/2024 5:52 PM

## 2024-05-15 NOTE — Discharge Instructions (Signed)
..   Substance Abuse Resources     SUBSTANCE USE TREATMENT for Medicaid and State Funded/IPRS  [facility] [address] [place], [place], [address] [phone] phone NOTE: [facility] is no longer offering IOP services.  Serves those who are low-income or have no insurance.  [facility] [address] [place], [place], [address] [phone] phone [phone] fax NOTE: Does have Substance Abuse-Intensive Outpatient Program [facility]) as well as transitional housing if eligible.  [facility] [address] [place], [place], [address] [phone] phone [phone] fax  [facility] [phone] [place], [place], [address] [phone] phone [phone] fax    HALFWAY HOUSES:  [facility] [phone]  [email or web address]     12 STEP PROGRAMS:  Alcoholics Anonymous of [place] Software[employer]alet.be  Narcotics Anonymous of [place] [employer]ct.dk  Al-Anon of [place], [place] www.[place]alanon.org/find-meetings.html  Nar-Anon https://nar-anon.org/find-a-meetin      List of Residential placements:   [facility] in [place]: [phone]  [facility]: [phone]  [place], [place] [phone]: Male and male facility; 30-day program: (uninsured and Medicaid such as Laurena Bering, [place], [place], partners)  [facility]: [phone]; men and women's facility; 28 days; Can have Medicaid tailored plan Tour manager or Partners)  [facility]: [phone] Karoline Caldwell or Larita Fife; 28 day program; must be fully detox; tailored Medicaid or no insurance  [address] in [place], [place]; [phone]; 28 day all males program; no insurance accepted  [facility] Referral in [place]: Gabriel Rung [phone] (no insurance or Medicaid only); 90 days; outpatient services but provide housing in apartments  [place]  [facility] Admission: [phone]: Patient must complete phone screening for placement: [place], [place]; 6 month program; uninsured, Medicaid, and [employer].   [facility]: no insurance required; [phone]  [facility]: [phone]; Intake: Molly Maduro; Must fill out application online; Alecia Lemming Delay [phone] x [phone] [facility] in [place], [place]: [phone]; Admissions Coordinators Mr. Maurine Minister or Barron Alvine; 90 day program.  [facility]: [place], [place] [phone]; Co-Ed 9 month to a year program; Online application; Men entry fee is $500 (6-71months);  [employer]: [address] [place], [place] [address]; no fee or insurance required; minimum of 2 years; Highly structured; work based; Intake Coordinator is Thayer Ohm [phone]  [facility] in [place], [place]: [phone]; Fax number is [phone]; website: [email or web address].org; Requires 3-6 page autobiography; 2 year program (18 months and then 32month transitional housing); Admission fee is $300; no insurance needed; work Automotive engineer in [place], [place]: [employer] Desk Staff: Danise Edge [phone]: They have a Men's Regenerations Program 6-15months. Free program; There is an initial $300 fee however, they are willing to work with patients regarding that. Application is online.  [facility]: Admissions [phone] Doran Heater ext [phone]; Any 7-90 day program is out of pocket; 12 month program is free of charge; there is a $275 entry fee; Patient is responsible for own transportation

## 2024-05-15 NOTE — Plan of Care (Signed)
  Problem: Activity: Goal: Sleeping patterns will improve Outcome: Progressing   

## 2024-05-15 NOTE — Plan of Care (Signed)

## 2024-05-16 DIAGNOSIS — F319 Bipolar disorder, unspecified: Secondary | ICD-10-CM

## 2024-05-16 MED ORDER — OLANZAPINE 10 MG PO TABS: ORAL_TABLET | ORAL | 0 refills | Status: AC

## 2024-05-16 MED ORDER — OLANZAPINE 20 MG PO TABS: 20.0000 mg | ORAL_TABLET | Freq: Every day | ORAL | 0 refills | Status: AC

## 2024-05-16 NOTE — Plan of Care (Signed)
   Problem: Education: Goal: Emotional status will improve Outcome: Progressing Goal: Mental status will improve Outcome: Progressing Goal: Verbalization of understanding the information provided will improve Outcome: Progressing   Problem: Activity: Goal: Interest or engagement in activities will improve Outcome: Progressing

## 2024-05-16 NOTE — Progress Notes (Signed)
 [name] MARLA Bihari D/C'd Home per MD order.  Discussed with the patient and all questions fully answered.  An After Visit Summary was printed and given to the patient. Patient received prescription.  D/c education completed with patient including follow up instructions, medication list, d/c activities limitations if indicated, with other d/c instructions as indicated by MD - patient able to verbalize understanding, all questions fully answered. Patient denies SI/HI/AVH, and  all his belongings given to him at discharge.   Patient instructed to return to ED, call 911, or call MD for any changes in condition.   Patient escorted to the main entrance, and D/C home via public transport.[name] 05/16/2024 10:45 AM

## 2024-05-16 NOTE — Progress Notes (Signed)
°   05/15/24 2351  Psych Admission Type (Psych Patients Only)  Admission Status Voluntary  Psychosocial Assessment  Patient Complaints Anxiety  Eye Contact Fair  Facial Expression Flat  Affect Anxious  Speech Logical/coherent  Interaction Assertive  Motor Activity Slow  Appearance/Hygiene Disheveled  Behavior Characteristics Cooperative  Mood Anxious  Thought Process  Coherency WDL  Content WDL  Delusions None reported or observed  Perception WDL  Hallucination None reported or observed  Judgment Poor  Confusion None  Danger to Self  Current suicidal ideation? Denies  Danger to Others  Danger to Others None reported or observed

## 2024-05-16 NOTE — Plan of Care (Signed)
  Problem: Education: Goal: Emotional status will improve Outcome: Completed/Met   Problem: Education: Goal: Mental status will improve Outcome: Completed/Met   Problem: Education: Goal: Verbalization of understanding the information provided will improve Outcome: Completed/Met

## 2024-05-16 NOTE — Group Note (Signed)
 Date:  05/16/2024 Time:  9:41 AM  Group Topic/Focus:  Goals Group:   The focus of this group is to help patients establish daily goals to achieve during treatment and discuss how the patient can incorporate goal setting into their daily lives to aide in recovery. Orientation:   The focus of this group is to educate the patient on the purpose and policies of crisis stabilization and provide a format to answer questions about their admission.  The group details unit policies and expectations of patients while admitted.    Participation Level:  Active  Participation Quality:  Appropriate  Affect:  Appropriate  Cognitive:  Appropriate  Insight: Appropriate  Engagement in Group:  Engaged and Supportive  Modes of Intervention:  Activity and Discussion  Additional Comments:  N/A  Lauris JONELLE Morales 05/16/2024, 9:41 AM

## 2024-05-16 NOTE — [facility] Suicide Risk Assessment (Signed)
 [facility] Discharge Suicide Risk Assessment   Principal Problem: Bipolar disorder current episode depressed (HCC) Discharge Diagnoses: Principal Problem:   Bipolar disorder current episode depressed (HCC)   [name] is a 58 y.o. male  with a past psychiatric history of Bipolar I disorder, GAD, cocaine use disorder, heroin use disorder, methamphetamine use disorder. Patient initially arrived to [facility] on 12/10 for suicidal thoughts and hallucinations, and admitted to [facility] on 12/10 for acute safety concerns, impaired functioning, substance related issues, and severe substance-induced psychosis or mood disturbances. PMHx is extensive and detailed below.   Musculoskeletal: Strength & Muscle Tone: abnormal Gait & Station: normal Patient leans: N/A  Psychiatric Specialty Exam  Appearance: older than stated age, lying in bed wearing hospital scrubs  Behavior: Attention seeking  Attitude: Irritable  Speech: Normal rate, rhythm, volume  Mood: Better  Affect: Congruent  Thought Process: LLGD   Thought Content: no delusions elicited  SI/HI: Denied passive or active SI or HI  Perceptions: Denied AVH overnight  Judgement: Poor  Insight: Poor  Fund of Knowledge: WNL    Physical Exam: Physical Exam Vitals reviewed.  Constitutional:      General: He is not in acute distress.    Comments: Chronically ill appearing   Pulmonary:     Effort: Pulmonary effort is normal. No respiratory distress.  Musculoskeletal:        General: Deformity present.  Neurological:     Mental Status: He is alert.    Review of Systems  Constitutional:  Negative for chills and fever.  Gastrointestinal:  Negative for nausea and vomiting.  All other systems reviewed and are negative.  Blood pressure (!) 132/92, pulse 90, temperature (!) 97.5 F (36.4 C), temperature source Oral, resp. rate 19, height 5' 11 (1.803 m), weight 99.6 kg, SpO2 100%. Body mass index is 30.63 kg/m.  Mental Status Per  Nursing Assessment::   On Admission:  Suicidal ideation indicated by patient  Demographic factors:  Caucasian Current Mental Status:  Suicidal ideation indicated by patient Loss Factors:  NA Historical Factors:  Impulsivity Risk Reduction Factors:  NA   Continued Clinical Symptoms:  Depression:   Aggression Severe Alcohol/Substance Abuse/Dependencies Chronic Pain  Cognitive Features That Contribute To Risk:  Loss of executive function    Suicide Risk:  Mild: There are no identifiable suicide plans, no associated intent, mild dysphoria and related symptoms, good self-control (both objective and subjective assessment), few other risk factors, and identifiable protective factors, including available and accessible social support.    Follow-up Information     [facility]. Go on 05/17/2024.   Why: Please go to this provider on 05/17/24 at 8:30 am for a hospital follow up assessment to obtain therapy and medication management appointments.  You may also go Monday through Friday, 24/7 for re-assessments. Contact information: [address] [place] [address] [phone]                 Plan Of Care/Follow-up recommendations:   -Follow-up with your outpatient psychiatric provider -instructions on appointment date, time, and address (location) are provided to you in discharge paperwork.  -Take your psychiatric medications as prescribed at discharge - instructions are provided to you in the discharge paperwork  -Recommend abstinence from alcohol, tobacco, and other illicit drug use at discharge.   -If your psychiatric symptoms recur, worsen, or if you have side effects to your psychiatric medications, call your outpatient psychiatric provider, 911, [phone] or go to the nearest emergency department.  -  If suicidal thoughts recur, call your outpatient psychiatric provider, 911, [phone] or go to the nearest emergency department.     Tanysha Quant,  MD 05/16/2024, 9:00 AM

## 2024-05-16 NOTE — Progress Notes (Signed)
(  Sleep Hours) - 11.75 (Any PRNs that were needed, meds refused, or side effects to meds)- No PRN meds given, no meds refused.  (Any disturbances and when (visitation, over night)- None (Concerns raised by the patient)- None  (SI/HI/AVH)- Denies SI/HI/AVH

## 2024-05-16 NOTE — Transportation (Signed)
 05/16/2024  [name] DOB: 06-Mar-1966 MRN: [id number]   RIDER WAIVER AND RELEASE OF LIABILITY  For the purposes of helping with transportation needs, [facility] partners with outside transportation providers (taxi companies, [place], catering manager.) to give [facility] patients or other approved people the choice of on-demand rides Public Librarian) to our buildings for non-emergency visits.  By using [employer], I, the person signing this document, on behalf of myself and/or any legal minors (in my care using the [employer]), agree:  Science Writer given to me are supplied by independent, outside transportation providers who do not work for, or have any affiliation with, [employer]. [facility] is not a transportation company. [facility] has no control over the quality or safety of the rides I get using [employer]. [facility] has no control over whether any outside ride will happen on time or not. [facility] gives no guarantee on the reliability, quality, safety, or availability on any rides, or that no mistakes will happen. I know and accept that traveling by vehicle (car, truck, SVU, fleeta, bus, taxi, etc.) has risks of serious injuries such as disability, being paralyzed, and death. I know and agree the risk of using [employer] is mine alone, and not [employer]. [employer] are provided as is and as are available. The transportation providers are in charge for all inspections and care of the vehicles used to provide these rides. I agree not to take legal action against [facility], its agents, employees, officers, directors, representatives, insurers, attorneys, assigns, successors, subsidiaries, and affiliates at any time for any reasons related directly or indirectly to using [employer]. I also agree not to take legal action against [facility] or its affiliates for any injury, death, or damage to property caused by or related to using  [employer]. I have read this Waiver and Release of Liability, and I understand the terms used in it and their legal meaning. This Waiver is freely and voluntarily given with the understanding that my right (or any legal minors) to legal action against [facility] relating to [employer] is knowingly given up to use these services.   I attest that I read the Ride Waiver and Release of Liability to [name], gave Mr. Luginbill the opportunity to ask questions and answered the questions asked (if any). I affirm that [name] then provided consent for assistance with transportation.

## 2024-05-16 NOTE — Discharge Summary (Signed)
 Physician Discharge Summary Note  Patient:  [name] is an 58 y.o., male MRN:  [id number] DOB:  Feb 23, 1966 Patient phone:  [phone] (home)  Patient address:   [address] [place] [address],   Date of Admission:  05/12/2024 Date of Discharge: 12/15  Reason for Admission:  Command auditory hallucinations to kill self  Principal Problem: Bipolar disorder current episode depressed [facility]) Discharge Diagnoses: Principal Problem:   Bipolar disorder current episode depressed [facility])   Past Psychiatric History:   Current psychiatrist: [facility] Previous psychiatric diagnoses: Bipolar I disorder  Psychiatric hospitalization(s): Multiple inpatient stays largely defined by hallucinations, polysubstance use and suicidal thinking. History of suicide (obtained from HPI): Denies -- per chart review, patient attempted suicide in 2022. Took drugs to get the nerve to jump out of a 5th floor window. Also made preparations with a gun per primary care note.  History of homicide or aggression (obtained in HPI): None elicited.   Past Medical History:  Past Medical History:  Diagnosis Date   Accelerated hypertension 08/23/2016   pt denies this   AKI (acute kidney injury) 08/23/2016   Angina, class III 08/18/2015   Anxiety    Anxiety state 10/28/2006   Qualifier: Diagnosis of   By: Antonio ROSALEA Rockers      IMO SNOMED Dx Update Oct 2024     Arthritis    Barrett's esophagus with dysplasia 10/27/2023   Bipolar affective disorder (HCC)    Bipolar I disorder, most recent episode (or current) manic (HCC) 10/28/2006   >>OVERVIEW FOR DISORDER, BIPOLAR NOS WRITTEN ON 07/10/2010  5:22 PM BY INTERFACE, PROBLEM LIST IN     Qualifier: Diagnosis of   By: Antonio ROSALEA Rockers         Chronic back pain    Chronic kidney disease 2017   AKF - due to Rhabdomyolysis   Chronic kidney disease 2017   AKF - due to Rhabdomyolysis   Chronic nausea 10/27/2023   Cocaine use disorder, mild, abuse (HCC)  12/26/2015   Coronary artery disease    minimal Mid LAD to Dist LAD lesion, 35% stenosed. Prox Cx lesion, 40% stenosed.   Degenerative disc disease, lumbar 09/07/2013   DJD (degenerative joint disease)    Drug overdose, multiple drugs 08/23/2016   Elevated liver function tests    Emphysema of lung (HCC)    Family history of premature coronary artery disease 08/18/2015   Fibromyalgia    GERD 10/28/2006   Qualifier: Diagnosis of   By: Antonio ROSALEA Rockers         Head injury    Head injury, closed, with concussion    x 3  from falls   History of rhabdomyolysis 12/26/2015   History of suicidal ideation 10/02/2013   2010, 2012     Hx of renal failure 12/26/2015   Hypercalcemia 10/27/2023   Hyperlipidemia    Hypothyroidism    Inguinal hernia    left   Insomnia    Irritable bowel syndrome 08/09/2008   Qualifier: Diagnosis of   By: Avram MD, NOLIA Pitts E        Leg pain 02/24/2011   Liver injury 12/26/2015   Multiple falls 10/02/2013   Myofascial pain syndrome    Neck pain 02/24/2011   Osteopenia    PAIN, CHRONIC NEC 10/28/2006   Qualifier: Diagnosis of   By: Antonio DO, Yvonne         Postural imbalance with levoscoliosis, h/o 10/02/2013   Primary hypertension 08/23/2016  Primary localized osteoarthritis of right hip 03/15/2019   Primary osteoarthritis of left hip 03/05/2021   Primary osteoarthritis of right hip 03/15/2019   Pseudoarthrosis of cervical spine (HCC) 12/23/2017   Rhabdomyolysis 08/23/2016   Right hip pain 02/24/2011   Spinal cord stimulator, status-post 10/02/2013   Per Dr. Carles 2010     Spinal stenosis of cervical region 09/21/2017   Statin myopathy 10/27/2023   Tachycardia 08/23/2016   Tibial plateau fracture, left    Tobacco use disorder, continuous 08/18/2015   Transaminitis 08/23/2016   Tremor 09/15/2023   Tremors of nervous system     Past Surgical History:  Procedure Laterality Date   ANTERIOR CERVICAL DECOMP/DISCECTOMY FUSION N/A 09/21/2017    Procedure: ANTERIOR CERVICAL DECOMPRESSION/DISCECTOMY FUSION - CERVICAL THREE-CERVICAL FOUR;  Surgeon: Onetha Kuba, MD;  Location: [facility] OR;  Service: Neurosurgery;  Laterality: N/A;   ANTERIOR CERVICAL DECOMP/DISCECTOMY FUSION N/A 12/23/2017   Procedure: REMOVAL AND REPLACEMENT OF CERVICAL THREE-FOUR HARDWARE;  Surgeon: Onetha Kuba, MD;  Location: [facility] OR;  Service: Neurosurgery;  Laterality: N/A;   CARDIAC CATHETERIZATION N/A 08/31/2015   Procedure: Left Heart Cath and Coronary Angiography;  Surgeon: Alm LELON Clay, MD;  Location: [facility] INVASIVE CV LAB;  Service: Cardiovascular;  Laterality: N/A;   Carotid Dopplers Bilateral 02/20/2015   [facility]: Mild, less than 39% left and right internal carotid artery stenosis. No significant plaque burden   CERVICAL DISC SURGERY     C5-7   COLONOSCOPY     COLONOSCOPY     ESOPHAGOGASTRODUODENOSCOPY     FASCIOTOMY Left 10/20/2013   Procedure: LEFT leg ANTERIOR COMPARTMENT FACSCIOTOMY;  Surgeon: Ozell VEAR Bruch, MD;  Location: [facility] OR;  Service: Orthopedics;  Laterality: Left;   INGUINAL HERNIA REPAIR Left    Nuclear Stress Test  03/06/2015   [facility]: Normal EKG. Low normal EF (49%) normal regional wall motion. No evidence of ischemia or infarction.   ORIF TIBIA PLATEAU Left 10/20/2013   Procedure: OPEN REDUCTION INTERNAL FIXATION (ORIF) LEFT TIBIAL PLATEAU;  Surgeon: Ozell VEAR Bruch, MD;  Location: [facility] OR;  Service: Orthopedics;  Laterality: Left;   prostate implant  10/20/2023   SEPTOPLASTY  2010   SPINAL CORD STIMULATOR BATTERY EXCHANGE N/A 02/08/2016   Procedure: Lumbar spinal cord stimulator implantable pulse generator replacement;  Surgeon: Deward Fabian, MD;  Location: [facility] NEURO ORS;  Service: Neurosurgery;  Laterality: N/A;   SPINAL CORD STIMULATOR BATTERY EXCHANGE N/A 12/23/2017   Procedure: SPINAL CORD STIMULATOR BATTERY EXCHANGE;  Surgeon: Fabian Deward, MD;  Location: [facility] OR;  Service: Neurosurgery;  Laterality: N/A;   SPINAL  CORD STIMULATOR IMPLANT     TOTAL HIP ARTHROPLASTY Right 03/15/2019   Procedure: RIGHT TOTAL HIP ARTHROPLASTY ANTERIOR APPROACH;  Surgeon: Sheril Coy, MD;  Location: [facility] ORS;  Service: Orthopedics;  Laterality: Right;   TOTAL HIP ARTHROPLASTY Left 03/05/2021   Procedure: LEFT TOTAL HIP ARTHROPLASTY ANTERIOR APPROACH;  Surgeon: Sheril Coy, MD;  Location: [facility] ORS;  Service: Orthopedics;  Laterality: Left;   TRANSTHORACIC ECHOCARDIOGRAM  02/20/2015   [facility]: Mild concentric LVH. EF 55-60%. Normal regional wall motion. Mild to moderate TR with no significant pulmonary hypertension.   UPPER GASTROINTESTINAL ENDOSCOPY     Family History:  Family History  Problem Relation Age of Onset   Heart disease Mother    Hypertension Mother    Sudden death Mother        Presumably cardiac   Hyperlipidemia Father    Heart attack Father 57  Multiple heart attacks   Heart failure Father    Diabetes Father    Hypertension Father    Kidney disease Father    Diabetes Brother    Lung cancer Maternal Grandmother    Diabetes Paternal Grandmother    Sudden death Paternal Grandmother        Unclear etiology   Epilepsy Daughter    Diabetes Other    Kidney disease Other    Colon cancer Neg Hx    Mental illness Neg Hx    Esophageal cancer Neg Hx    Stomach cancer Neg Hx    Rectal cancer Neg Hx    Family Psychiatric  History: Mother has unknown history of mental illness  Social History:  Social History   Substance and Sexual Activity  Alcohol Use No   Alcohol/week: 0.0 standard drinks of alcohol   Comment: none since 1999- heavy drinker in past     Social History   Substance and Sexual Activity  Drug Use Not Currently   Types: Cocaine   Comment: rarely- last 2015-    Social History   Socioeconomic History   Marital status: Legally Separated    Spouse name: Not on file   Number of children: 1   Years of education: Not on file   Highest education level: Some  college, no degree  Occupational History   Occupation: disabled   Occupation: disable  Tobacco Use   Smoking status: Every Day    Current packs/day: 0.50    Average packs/day: 1 pack/day for 41.0 years (40.9 ttl pk-yrs)    Types: Cigarettes    Start date: 1985    Passive exposure: Current   Smokeless tobacco: Never  Vaping Use   Vaping status: Every Day   Substances: Nicotine   Substance and Sexual Activity   Alcohol use: No    Alcohol/week: 0.0 standard drinks of alcohol    Comment: none since 1999- heavy drinker in past   Drug use: Not Currently    Types: Cocaine    Comment: rarely- last 2015-   Sexual activity: Not on file  Other Topics Concern   Not on file  Social History Narrative   He is a separated father of 1. His daughter is married and recent OT graduate living in [place].   He is currently disabled due to chronic back pain.    He did some college education.   He currently is trying to use electronic cigarettes but has smoked one pack a day for over 35 years.   He has recently stopped using prescription benzodiazepine and pain medications because he is in the process of changing pain med doctors.   He does not routinely exercise secondary to back, neck pain as well as dyspnea.   Right handed    Social Drivers of Health   Tobacco Use: High Risk (05/12/2024)   Patient History    Smoking Tobacco Use: Every Day    Smokeless Tobacco Use: Never    Passive Exposure: Current  Financial Resource Strain: Medium Risk (12/28/2023)   Overall Financial Resource Strain (CARDIA)    Difficulty of Paying Living Expenses: Somewhat hard  Food Insecurity: Food Insecurity Present (05/12/2024)   Epic    Worried About Programme Researcher, Broadcasting/film/video in the Last Year: Often true    Ran Out of Food in the Last Year: Often true  Transportation Needs: No Transportation Needs (05/12/2024)   Epic    Lack of Transportation (Medical): No    Lack of Transportation (  Non-Medical): No  Physical  Activity: Inactive (12/28/2023)   Exercise Vital Sign    Days of Exercise per Week: 0 days    Minutes of Exercise per Session: Not on file  Stress: Stress Concern Present (12/28/2023)   [employer] of Occupational Health - Occupational Stress Questionnaire    Feeling of Stress: To some extent  Social Connections: Socially Isolated (12/28/2023)   Social Connection and Isolation Panel    Frequency of Communication with Friends and Family: Never    Frequency of Social Gatherings with Friends and Family: Never    Attends Religious Services: Never    Database Administrator or Organizations: No    Attends Engineer, Structural: Not on file    Marital Status: Separated  Depression (PHQ2-9): High Risk (04/07/2024)   Depression (PHQ2-9)    PHQ-2 Score: 12  Alcohol Screen: Low Risk (05/12/2024)   Alcohol Screen    Last Alcohol Screening Score (AUDIT): 0  Housing: High Risk (05/12/2024)   Epic    Unable to Pay for Housing in the Last Year: Yes    Number of Times Moved in the Last Year: 0    Homeless in the Last Year: No  Utilities: At Risk (05/12/2024)   Epic    Threatened with loss of utilities: Yes  Health Literacy: Not on file    Hospital Course:    During the patient's hospitalization, patient had extensive initial psychiatric evaluation, and follow-up psychiatric evaluations every day.  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  - Continue home Buspirone  30 mg BID - Continue home hydroxyzine  25 mg q4h PRN for anxiety   #Tobacco use disorder - Patches and gum provided   #Chronic pain - Continue home Oxycodone  10 mg QID  - Continue home robaxin  750 mg PRN for muscle spasms - Continue home bentyl  10 mg q8h PRN     During the hospitalization, other adjustments were made to the patient's psychiatric medication regimen:   - Increase home Olanzapine   to 10 mg qAM + 20 mg qPM.  - Continue home Buspirone  30 mg BID - Continue home hydroxyzine  25 mg q4h PRN for anxiety   #Tobacco use disorder - Patches and gum provided   #Chronic pain - Continue home Oxycodone  10 mg QID  - Change home robaxin  750 mg from PRN to 4 times daily for muscle spasms - Continue home bentyl  10 mg q8h PRN   #Constipation - Start miralax  daily PRN as needed    #BL trace LE edema - Continue to monitor.    #Headache - PO promethazine  25 mg x1 seemed to help - PO promethazine  12.5 mg q8h prn    Patient's care was discussed during the interdisciplinary team meeting every day during the hospitalization.  The patient denied having side effects to prescribed psychiatric medication.  Gradually, patient started adjusting to milieu. The patient was evaluated each day by a clinical provider to ascertain response to treatment. Improvement was noted by the patient's report of decreasing symptoms, improved sleep and appetite, affect, medication tolerance, behavior, and participation in unit programming.  Patient was asked each day to complete a self inventory noting mood, mental status, pain, new symptoms, anxiety and concerns.   Symptoms were reported as significantly decreased or resolved completely by discharge.  The  patient reports that their mood is stable.  The patient denied having suicidal thoughts for more than 48 hours prior to discharge.  Patient denies having homicidal thoughts.  Patient denies having auditory hallucinations.  Patient denies any visual hallucinations or other symptoms of psychosis.  The patient was motivated to continue taking medication with a goal of continued improvement in mental health.   The patient reports their target psychiatric symptoms of CAH with suicidal thinking responded well to the  psychiatric medications, and the patient reports overall benefit other psychiatric hospitalization. Supportive psychotherapy was provided to the patient. The patient also participated in regular group therapy while hospitalized. Coping skills, problem solving as well as relaxation therapies were also part of the unit programming.  Labs were reviewed with the patient, and abnormal results were discussed with the patient.  The patient is able to verbalize their individual safety plan to this provider.  # It is recommended to the patient to continue psychiatric medications as prescribed, after discharge from the hospital.    # It is recommended to the patient to follow up with your outpatient psychiatric provider and PCP.  # It was discussed with the patient, the impact of alcohol, drugs, tobacco have been there overall psychiatric and medical wellbeing, and total abstinence from substance use was recommended the patient.ed.  # Prescriptions provided or sent directly to preferred pharmacy at discharge. Patient agreeable to plan. Given opportunity to ask questions. Appears to feel comfortable with discharge.    # In the event of worsening symptoms, the patient is instructed to call the crisis hotline, 911 and or go to the nearest ED for appropriate evaluation and treatment of symptoms. To follow-up with primary care provider for other medical issues, concerns and or health care needs  # Patient was discharged home with a plan to follow up as noted below.    On day of discharge, patient denied SI, HI and AVH. Will get substance use follow-up from [facility]. CAH had resolved over last 72 hours. Attributes this to medication. Will go home from hospital. Recommended inpatient substance use, which patient declined. Patient said suboxone  made him psychotic two years ago.  Did not appear interested in starting MAT. Noted some constipation and chronic pain, nothing worrisome. Patient is forward-thinking,  understands that continued presentations like this would contributed significantly to morbidity and mortality. Discussed previously side-effects of current olanzapine  dosing 10 mg + 20 mg, including orthostasis, lethargy and weight gain.     Physical Findings: AIMS:  , ,  ,  ,  ,  ,   CIWA:    COWS:     Musculoskeletal: Strength & Muscle Tone: within normal limits Gait & Station: normal Patient leans: N/A   Appearance: older than stated age, lying in bed wearing hospital scrubs  Behavior: Attention seeking  Attitude: Irritable  Speech: Normal rate, rhythm, volume  Mood: Pretty good  Affect: Congruent  Thought Process: LLGD   Thought Content: no delusions elicited  SI/HI: Denied passive or active SI or HI  Perceptions: Denied AVH overnight  Judgement: Poor  Insight: Poor  Fund of Knowledge: WNL   Physical Exam Constitutional:      General: He is not in acute distress.    Appearance: He is normal weight.     Comments: Chronically ill-appearing   Pulmonary:     Effort: Pulmonary effort is normal. No respiratory distress.  Neurological:     General: No focal deficit present.     Mental Status: He is  alert.  Psychiatric:        Mood and Affect: Mood normal.    Review of Systems  Constitutional:  Negative for chills and fever.  Gastrointestinal:  Negative for nausea and vomiting.  All other systems reviewed and are negative.  Blood pressure (!) 132/92, pulse 90, temperature (!) 97.5 F (36.4 C), temperature source Oral, resp. rate 19, height 5' 11 (1.803 m), weight 99.6 kg, SpO2 100%. Body mass index is 30.63 kg/m.   Tobacco Use History[1] Tobacco Cessation:  Prescription not provided because: Patient already has prescription   Blood Alcohol level:  Lab Results  Component Value Date   [facility] <15 05/11/2024   ETH <10 05/03/2022    Metabolic Disorder Labs:  Lab Results  Component Value Date   HGBA1C 4.8 05/13/2024   MPG 91.06 05/13/2024   MPG 99.67  07/14/2021   Lab Results  Component Value Date   PROLACTIN 27.7 (H) 10/29/2016   Lab Results  Component Value Date   CHOL 150 05/13/2024   TRIG 92 05/13/2024   HDL 71 05/13/2024   CHOLHDL 2.1 05/13/2024   VLDL 18 05/13/2024   LDLCALC 61 05/13/2024   LDLCALC 53 02/02/2024    See Psychiatric Specialty Exam and Suicide Risk Assessment completed by Attending Physician prior to discharge.  Discharge destination:  Home  Is patient on multiple antipsychotic therapies at discharge:  No     Allergies as of 05/16/2024       Reactions   Suvorexant     Other Reaction(s): Unknown   Flomax [tamsulosin] Other (See Comments)   Makes me pass out   Lyrica [pregabalin] Other (See Comments)   Caused extreme depression   Statins Other (See Comments)   Rhabdomyolysis & Kidney failure    Zolpidem  Tartrate Other (See Comments)   Hallucinations and sleep walks   Demerol [meperidine] Itching, Rash, Hives   Mirtazapine    Other Reaction(s): Blurred VIsion        Medication List     TAKE these medications      Indication  amantadine  100 MG capsule Commonly known as: SYMMETREL  Take 1 capsule (100 mg total) by mouth 2 (two) times daily. The timing of this medication is very important.  Indication: Drug-induced parkinsonism   acetaminophen  325 MG tablet Commonly known as: TYLENOL  Take 2 tablets (650 mg total) by mouth every 4 (four) hours as needed for mild pain (1-3) or moderate pain (4-6).  Indication: Pain associated with Arthritis   albuterol  108 (90 Base) MCG/ACT inhaler Commonly known as: VENTOLIN  HFA Inhale 2 puffs into the lungs every 6 (six) hours as needed for wheezing or shortness of breath.  Indication: Emphysema of the Lung   aspirin  EC 81 MG tablet Commonly known as: Aspirin  81 Take 1 tablet (81 mg total) by mouth daily.  Indication: Arthritis, Headache   busPIRone  30 MG tablet Commonly known as: BUSPAR  Take 30 mg by mouth in the morning and at bedtime.   Indication: Anxiety Disorder, Major Depressive Disorder   CertaVite/Antioxidants Tabs Take 1 tablet by mouth daily.  Indication: Supplementation   diclofenac  Sodium 1 % Gel Commonly known as: Voltaren  Apply 2 - 4 grams to painful joints up to 4 times daily if needed.  Indication: Joint Damage causing Pain and Loss of Function   dicyclomine  10 MG capsule Commonly known as: BENTYL  Take 1 capsule (10 mg total) by mouth every 8 (eight) hours as needed.  Indication: Irritable Bowel Syndrome   docusate sodium  100 MG capsule Commonly  known as: COLACE Take 1 capsule (100 mg total) by mouth 2 (two) times daily as needed for constipation.  Indication: Constipation   Dulera  100-5 MCG/ACT Aero Generic drug: mometasone -formoterol  Inhale 2 puffs into the lungs 2 (two) times daily. Wash mouth out with water  after use.  Indication: Emphysema of the Lung   esomeprazole  40 MG capsule Commonly known as: NexIUM  Take 1 capsule (40 mg total) by mouth daily.  Indication: Gastroesophageal Reflux Disease   famotidine  20 MG tablet Commonly known as: PEPCID  Take 1 tablet (20 mg total) by mouth 2 (two) times daily.  Indication: Gastroesophageal Reflux Disease   FT Nicotine  Mini 2 MG lozenge Generic drug: nicotine  polacrilex Take 1 lozenge (2 mg total) by mouth as needed for smoking cessation.  Indication: Nicotine  Addiction   hydrOXYzine  25 MG tablet Commonly known as: ATARAX  Take 1 tablet (25 mg total) by mouth 3 (three) times daily.  Indication: Feeling Anxious   isosorbide  mononitrate 30 MG 24 hr tablet Commonly known as: IMDUR  Take 1 tablet (30 mg total) by mouth daily.  Indication: Stable Angina Pectoris   levothyroxine  50 MCG tablet Commonly known as: SYNTHROID  Take 1 tablet (50 mcg total) by mouth daily.  Indication: Underactive Thyroid    METAMUCIL FIBER PO Take by mouth. 2 tsp daily What changed:  when to take this reasons to take this additional instructions     methocarbamol  750 MG tablet Commonly known as: ROBAXIN  Take 1 tablet (750 mg total) by mouth 4 (four) times daily as needed.  Indication: Musculoskeletal Pain   minocycline  100 MG capsule Commonly known as: MINOCIN  Take 1 capsule (100 mg total) by mouth 2 (two) times daily.  Indication: Infection   naloxone  4 MG/0.1ML Liqd nasal spray kit Commonly known as: Narcan  Use 1 spray in nose as directed  Indication: Opioid Overdose   nicotine  21 mg/24hr patch Commonly known as: Nicoderm CQ  Place 1 patch (21 mg total) onto the skin daily.  Indication: Nicotine  Addiction   OLANZapine  10 MG tablet Commonly known as: ZYPREXA  Take 2 tablets (20 mg total) by mouth at bedtime AND 1 tablet (10 mg total) daily. What changed: You were already taking a medication with the same name, and this prescription was added. Make sure you understand how and when to take each.  Indication: Hallucinations   OLANZapine  20 MG tablet Commonly known as: ZYPREXA  Take 1 tablet (20 mg total) by mouth at bedtime. What changed: Another medication with the same name was added. Make sure you understand how and when to take each.  Indication: Schizophrenia   olmesartan  20 MG tablet Commonly known as: BENICAR  Take 1 tablet (20 mg total) by mouth daily. Start with a half tab and increase to a full tablet if BP is >140/90.  Indication: High Blood Pressure   ondansetron  4 MG tablet Commonly known as: ZOFRAN  Take 1 tablet (4 mg total) by mouth 4 (four) times daily as needed.  Indication: Nausea and Vomiting   Oxycodone  HCl 10 MG Tabs Take 1 tablet (10 mg total) by mouth 4 (four) times daily.  Indication: Chronic Pain   Repatha  SureClick 140 MG/ML Soaj Generic drug: Evolocumab  Inject 140 mg into the skin every 14 (fourteen) days.  Indication: High Amount of Fats in the Blood   traZODone  100 MG tablet Commonly known as: DESYREL  Take 100 mg by mouth at bedtime.  Indication: Trouble Sleeping   Vitamin D   (Ergocalciferol ) 1.25 MG (50000 UNIT) Caps capsule Commonly known as: DRISDOL  Take 1 capsule (50,000 Units  total) by mouth once a week.  Indication: Vitamin D  Deficiency        Follow-up Information     [facility] [employer]. Go on 05/17/2024.   Why: Please go to this provider on 05/17/24 at 8:30 am for a hospital follow up assessment to obtain therapy and medication management appointments.  You may also go Monday through Friday, 24/7 for re-assessments. Contact information: [address] [place] [address] [phone]                 Follow-up recommendations/Comments:    Substance Abuse Resources     SUBSTANCE USE TREATMENT for Medicaid and State Funded/IPRS  [facility] [address], [place], [address] [phone] phone NOTE: [facility] is no longer offering IOP services.  Serves those who are low-income or have no insurance.  [facility] [address] [place], [place], [address] [phone] phone [phone] fax NOTE: Does have Substance Abuse-Intensive Outpatient Program [facility]) as well as transitional housing if eligible.  [facility] [address] [place], [place], [address] [phone] phone [phone] fax  [facility] [phone] [place], [place], [address] [phone] phone [phone] fax    HALFWAY HOUSES:  [facility] [phone]  [email or web address].[email or web address]     12 STEP PROGRAMS:  Alcoholics Anonymous of [place] softwarechalet.be  Narcotics Anonymous of [place] hitprotect.dk  Al-Anon of [place], [place] www.[place]alanon.org/find-meetings.html  Nar-Anon https://nar-anon.org/find-a-meetin      List of Residential placements:   [facility] in [place]: [phone]  [facility]: [phone]  [place], [place] [phone]:  Male and male facility; 30-day program: (uninsured and Medicaid such as [place], [place], [place], partners)  [facility]: [phone]; men and women's facility; 28 days; Can have Medicaid tailored plan Tour Manager or Partners)  [facility]: [phone] [facility] or [place]; 28 day program; must be fully detox; tailored Medicaid or no insurance  [address] in [place], [place]; [phone]; 28 day all males program; no insurance accepted  [facility] in [place]: [place] [phone] (no insurance or Medicaid only); 90 days; outpatient services but provide housing in apartments [place]  [facility] Admission: [phone]: Patient must complete phone screening for placement: [place], [place]; 6 month program; uninsured, Medicaid, and [employer] financial.   [facility]: no insurance required; [phone]  [facility]: [phone]; Intake: [place]; Must fill out application online; Steffan Delay [phone] x [phone] [facility] in [place], [place]: [phone]; Admissions Coordinators Mr. Marinda or Alm Eagles; 90 day program.  [facility]: [place], [place] [phone]; Co-Ed 9 month to a year program; Online application; Men entry fee is $500 (6-83months);  [facility]: [address] [place], [place] [address]; no fee or insurance required; minimum of 2 years; Highly structured; work based; Intake Coordinator is [place] [phone]  [facility] in [place], [place]: [phone]; Fax number is [phone]; website: www.[facility].org; Requires 3-6 page autobiography; 2 year program (18 months and then 57month transitional housing); Admission fee is $300; no insurance needed; work Automotive Engineer in [place], [place]: [employer] Desk Staff: [place] [phone]: They have a Men's Regenerations Program 6-20months. Free program; There is an initial $300 fee however, they are willing to work with patients  regarding that. Application is online.  [facility]: Admissions [phone] [place] [phone]; Any 7-90 day program is out of pocket; 12 month program is free of charge; there is a $  275 entry fee; Patient is responsible for own transportation  Activity: as tolerated  Diet: heart healthy  Other: -Follow-up with your outpatient psychiatric provider -instructions on appointment date, time, and address (location) are provided to you in discharge paperwork.  -Take your psychiatric medications as prescribed at discharge - instructions are provided to you in the discharge paperwork  -Follow-up with outpatient primary care doctor and other specialists -for management of chronic medical disease, including: See problem list  -Recommend abstinence from alcohol, tobacco, and other illicit drug use at discharge.   -If your psychiatric symptoms recur, worsen, or if you have side effects to your psychiatric medications, call your outpatient psychiatric provider, 911, [phone] or go to the nearest emergency department.  -If suicidal thoughts recur, call your outpatient psychiatric provider, 911, [phone] or go to the nearest emergency department.  Signed: Delrose Rohwer, MD 05/16/2024, 11:07 AM           [1]  Social History Tobacco Use  Smoking Status Every Day   Current packs/day: 0.50   Average packs/day: 1 pack/day for 41.0 years (40.9 ttl pk-yrs)   Types: Cigarettes   Start date: 1985   Passive exposure: Current  Smokeless Tobacco Never

## 2024-05-16 NOTE — Group Note (Signed)
 Date:  05/16/2024 Time:  10:15 AM  Group Topic/Focus: Recreational Therapy    Pt did not attend recreational therapy group  [name] R [name] 05/16/2024, 10:15 AM

## 2024-05-16 NOTE — Progress Notes (Signed)
°  [facility] Adult Case Management Discharge Plan :  Will you be returning to the same living situation after discharge:  Yes,  pt returning home at discharge At discharge, do you have transportation home?: Yes,  CSW arranged [employer] for 1015 am (physical address is [address], [place], [place] Do you have the ability to pay for your medications: Yes,  pt has active health insurance coverage  Release of information consent forms completed and in the chart;  Patient's signature needed at discharge.  Patient to Follow up at:  Follow-up Information     [facility]. Go on 05/17/2024.   Why: Please go to this provider on 05/17/24 at 8:30 am for a hospital follow up assessment to obtain therapy and medication management appointments.  You may also go Monday through Friday, 24/7 for re-assessments. Contact information: [address] [name] [place] [address] [phone]                 Next level of care provider has access to [facility] Link:no  Safety Planning and Suicide Prevention discussed: Yes,  [name] - [phone] (wife/ex-wife)     Has patient been referred to the Quitline?: Patient refused referral for treatment  Patient has been referred for addiction treatment: Patient refused referral for treatment. List of substance use treatment facilities provided to pt at discharge per pt request  [name], LCSWA 05/16/2024, 8:42 AM

## 2024-05-17 ENCOUNTER — Other Ambulatory Visit (HOSPITAL_BASED_OUTPATIENT_CLINIC_OR_DEPARTMENT_OTHER): Payer: Self-pay

## 2024-05-17 ENCOUNTER — Other Ambulatory Visit: Payer: Self-pay

## 2024-05-17 LAB — HCV INTERPRETATION

## 2024-05-17 LAB — HCV AB W REFLEX TO QUANT PCR: HCV Ab: NONREACTIVE

## 2024-05-18 ENCOUNTER — Other Ambulatory Visit (HOSPITAL_BASED_OUTPATIENT_CLINIC_OR_DEPARTMENT_OTHER): Payer: Self-pay

## 2024-05-20 ENCOUNTER — Ambulatory Visit (HOSPITAL_BASED_OUTPATIENT_CLINIC_OR_DEPARTMENT_OTHER): Payer: Self-pay | Admitting: Student

## 2024-05-20 ENCOUNTER — Ambulatory Visit: Payer: Self-pay | Admitting: *Deleted

## 2024-05-20 NOTE — Telephone Encounter (Signed)
 FYI Only or Action Required?: FYI only for provider: ED advised.  Patient was last seen in primary care on 04/07/2024 by Rothfuss, Lang DASEN, PA-C.  Called Nurse Triage reporting Cough. SOB, coughing up flecks of blood  Symptoms began several days ago. 3 days ago   Interventions attempted: Rest, hydration, or home remedies.  Symptoms are: rapidly worsening.  Triage Disposition: Go to ED Now (or PCP Triage)  Patient/caregiver understands and will follow disposition?: Yes     Recommended ED now. If sx worsen go to ED. Hx emphysema           Copied from CRM #[id number]. Topic: Clinical - Red Word Triage >> May 20, 2024 12:24 PM Wess RAMAN wrote: Red Word that prompted transfer to Nurse Triage: Massive chest cold and coughing up blood. Reason for Disposition  Patient sounds very sick or weak to the triager  Answer Assessment - Initial Assessment Questions Recommended ED now. Patient reports he just got out of hospital. Recommended if sx worsen call 911 if needed.      1. ONSET: When did the cough begin?      3 days ago and coughing up flecks of blood last night  2. SEVERITY: How bad is the cough today? Did the blood appear after a coughing spell?      Yes  3. SPUTUM: Describe the color of your sputum (e.g., none, dry cough; clear, white, yellow, green)     Brown, green 4. HEMOPTYSIS: How much blood? (e.g., flecks, streaks, tablespoons)     Flecks  5. DIFFICULTY BREATHING: Are you having difficulty breathing? If Yes, ask: How bad is it? (e.g., mild, moderate, severe)      Moderate  6. FEVER: Do you have a fever? If Yes, ask: What is your temperature, how was it measured, and when did it start?     no 7. CARDIAC HISTORY: Do you have any history of heart disease? (e.g., heart attack, congestive heart failure)      Na  8. LUNG HISTORY: Do you have any history of lung disease?  (e.g., pulmonary embolus, asthma, emphysema)     Hx emhysema 9. PE RISK  FACTORS: Do you have a history of blood clots? Note: Other risk factors include recent major surgery, recent prolonged travel, being bedridden.     Na  10. OTHER SYMPTOMS: Do you have any other symptoms? (e.g., runny nose, wheezing, chest pain)       Headache, cough , with coughing spells SOB at rest 11. PREGNANCY: Is there any chance you are pregnant? When was your last menstrual period?       na 12. TRAVEL: Have you traveled out of the country in the last month? (e.g., travel history, exposures)       na  Protocols used: Coughing Up Blood-A-AH

## 2024-05-20 NOTE — Telephone Encounter (Signed)
 FYI Only or Action Required?: FYI only for provider: medication request- patient already triaged today- advised patient to follow NT advice- ED.  Patient was last seen in primary care on 04/07/2024 by Rothfuss, Lang DASEN, PA-C.  Called Nurse Triage reporting No chief complaint on file..  Symptoms began several days ago.    Symptoms are: unchanged.  Triage Disposition: Informational call  Patient/caregiver understands and will follow disposition?: Unsure  Copied from CRM #[id number]. Topic: Clinical - Medical Advice >> May 20, 2024  3:23 PM Joesph NOVAK wrote: Reason for CRM: Patients wife just did a flu test on him and he's Postive for flu- a & b. . Patients wife would like for his pcp to send in tamiflu . Symptoms have not worsened since NT this morning. She also wants to know if she should just take him to urgent care tomorrow. Please call.  Call to patient- he has already been triaged today and advised ED. Patient educated on use of Tamiflu - timely administration- his symptoms have been present 3 days. Encouraged ED for his symptoms- blood in sputum-  and he states he will go- not sure if he will go tonight or in am. Reason for Disposition  Prescription request for new medicine (not a refill)  Answer Assessment - Initial Assessment Questions 1. NAME of MEDICINE: What medicine(s) are you calling about?     Tamiflu  2. QUESTION: What is your question? (e.g., double dose of medicine, side effect)     Patient has tested + flu home test- requesting Rx for Tamiflu  3. PRESCRIBER: Who prescribed the medicine? Reason: if prescribed by specialist, call should be referred to that group.     N/A 4. SYMPTOMS: Do you have any symptoms? If Yes, ask: What symptoms are you having?  How bad are the symptoms (e.g., mild, moderate, severe)     Cough- blood tinged sputum- triaged earlier today and advised ED Patient may be beyond window to start Tamiflu - symptoms for 3 days. Patient advised to follow  NT advised for earlier call.  Protocols used: Medication Question Call-A-AH

## 2024-05-23 ENCOUNTER — Telehealth (HOSPITAL_BASED_OUTPATIENT_CLINIC_OR_DEPARTMENT_OTHER): Payer: Self-pay

## 2024-05-23 NOTE — Telephone Encounter (Signed)
 [name] (Patient)   Subject  [name] (Patient)   Topic  Referral - Request for Referral    Communication  Did the patient discuss referral with their provider in the last year? Dx discussed, carotid aneurysm (11/6 OV)        Appointment offered? No        Type of order/referral and detailed reason for visit:        Preference of office, provider, location: No preference, just someone good        If referral order, have you been seen by this specialty before? No    (If Yes, this issue or another issue? When? Where?      Can we respond through MyChart? Yes

## 2024-05-23 NOTE — Telephone Encounter (Signed)
 Spoke to pt regarding his flu sxs. He said his sxs started Wednesday and thinks he has pushed through. Did not go to UC or an ED this weekend. No longer coughing up blood and not coughing. Pt said he has asked Dr. Jacklyn about his carotid aneurysm and he keeps disregarding the issue. [place] they are not doing anything about it. Pt states he did not want to change neurosurgeons but thought Lang said he would refer him to someone for it.

## 2024-05-27 ENCOUNTER — Telehealth (HOSPITAL_BASED_OUTPATIENT_CLINIC_OR_DEPARTMENT_OTHER): Payer: Self-pay

## 2024-05-27 NOTE — Telephone Encounter (Signed)
 Pt advised to call Dr. Blanche' office for an appt to discuss carotid aneurysm. Pt will see Lang on 05/30/2024. He is currently not seeing any bloody sputums. Pt advised to please go to ER if his sxs return or worsen.

## 2024-05-29 ENCOUNTER — Encounter (HOSPITAL_BASED_OUTPATIENT_CLINIC_OR_DEPARTMENT_OTHER): Payer: Self-pay

## 2024-05-29 ENCOUNTER — Ambulatory Visit (INDEPENDENT_AMBULATORY_CARE_PROVIDER_SITE_OTHER)
Admit: 2024-05-29 | Discharge: 2024-05-29 | Disposition: A | Discharge status: Home / Self Care | Payer: MEDICAID | Admitting: Radiology

## 2024-05-29 ENCOUNTER — Other Ambulatory Visit (HOSPITAL_BASED_OUTPATIENT_CLINIC_OR_DEPARTMENT_OTHER): Payer: Self-pay

## 2024-05-29 ENCOUNTER — Ambulatory Visit (HOSPITAL_BASED_OUTPATIENT_CLINIC_OR_DEPARTMENT_OTHER)
Admission: RE | Admit: 2024-05-29 | Discharge: 2024-05-29 | Disposition: A | Discharge status: Home / Self Care | Payer: MEDICAID | Source: Ambulatory Visit | Attending: Family Medicine | Admitting: Family Medicine

## 2024-05-29 ENCOUNTER — Ambulatory Visit (HOSPITAL_BASED_OUTPATIENT_CLINIC_OR_DEPARTMENT_OTHER): Payer: Self-pay | Admitting: Family Medicine

## 2024-05-29 VITALS — BP 95/67 | HR 91 | Temp 98.7°F | Resp 18

## 2024-05-29 DIAGNOSIS — J438 Other emphysema: Secondary | ICD-10-CM

## 2024-05-29 DIAGNOSIS — J441 Chronic obstructive pulmonary disease with (acute) exacerbation: Secondary | ICD-10-CM | POA: Diagnosis not present

## 2024-05-29 DIAGNOSIS — R079 Chest pain, unspecified: Secondary | ICD-10-CM | POA: Diagnosis not present

## 2024-05-29 DIAGNOSIS — R059 Cough, unspecified: Secondary | ICD-10-CM | POA: Diagnosis not present

## 2024-05-29 DIAGNOSIS — R051 Acute cough: Secondary | ICD-10-CM | POA: Diagnosis not present

## 2024-05-29 DIAGNOSIS — R0602 Shortness of breath: Secondary | ICD-10-CM | POA: Diagnosis not present

## 2024-05-29 DIAGNOSIS — F17200 Nicotine dependence, unspecified, uncomplicated: Secondary | ICD-10-CM

## 2024-05-29 DIAGNOSIS — J101 Influenza due to other identified influenza virus with other respiratory manifestations: Secondary | ICD-10-CM

## 2024-05-29 MED ORDER — IPRATROPIUM-ALBUTEROL 0.5-2.5 (3) MG/3ML IN SOLN: 3.0000 mL | RESPIRATORY_TRACT | 0 refills | Status: AC | PRN

## 2024-05-29 MED ORDER — IPRATROPIUM-ALBUTEROL 0.5-2.5 (3) MG/3ML IN SOLN
3.0000 mL | Freq: Once | RESPIRATORY_TRACT | Status: AC
  Administered 2024-05-29: 3 mL via RESPIRATORY_TRACT

## 2024-05-29 MED ORDER — PROMETHAZINE-DM 6.25-15 MG/5ML PO SYRP: 5.0000 mL | ORAL_SOLUTION | Freq: Four times a day (QID) | ORAL | 0 refills | Status: AC | PRN

## 2024-05-29 MED ORDER — PREDNISONE 20 MG PO TABS: ORAL_TABLET | ORAL | 0 refills | Status: AC

## 2024-05-29 NOTE — ED Provider Notes (Addendum)
 " [name]    CSN: [id number] Arrival date & time: 05/29/24  9057      History   Chief Complaint Chief Complaint  Patient presents with   Influenza    Had for a week and not better - Entered by patient   Cough    HPI [name] is a 58 y.o. male.   58 year old male with history of emphysema who is an everyday smoker.  On 05/15/2024, he developed runny nose, cough, congestion.  On 05/19/2024, he was positive for influenza type B and influenza type A.  He was past the window of when he could start Tamiflu  so he did not ever seek care for the flu.  He uses Dulera  and albuterol  inhalers and has been using them.  He has persistent, worsening cough with a deep chest congestion.  He is now consistently wheezing daily and short of breath quite a bit.   Influenza Presenting symptoms: cough, rhinorrhea and shortness of breath   Presenting symptoms: no diarrhea, no fever, no nausea, no sore throat and no vomiting   Associated symptoms: nasal congestion   Associated symptoms: no chills and no ear pain   Cough Associated symptoms: rhinorrhea, shortness of breath and wheezing   Associated symptoms: no chest pain, no chills, no ear pain, no fever, no rash and no sore throat     Past Medical History:  Diagnosis Date   Accelerated hypertension 08/23/2016   pt denies this   AKI (acute kidney injury) 08/23/2016   Angina, class III 08/18/2015   Anxiety    Anxiety state 10/28/2006   Qualifier: Diagnosis of   By: Antonio ROSALEA Rockers      IMO SNOMED Dx Update Oct 2024     Arthritis    Barrett's esophagus with dysplasia 10/27/2023   Bipolar affective disorder (HCC)    Bipolar I disorder, most recent episode (or current) manic (HCC) 10/28/2006   >>OVERVIEW FOR DISORDER, BIPOLAR NOS WRITTEN ON 07/10/2010  5:22 PM BY INTERFACE, PROBLEM LIST IN     Qualifier: Diagnosis of   By: Antonio ROSALEA Rockers         Chronic back pain    Chronic kidney disease 2017   AKF - due to Rhabdomyolysis    Chronic kidney disease 2017   AKF - due to Rhabdomyolysis   Chronic nausea 10/27/2023   Cocaine use disorder, mild, abuse (HCC) 12/26/2015   Coronary artery disease    minimal Mid LAD to Dist LAD lesion, 35% stenosed. Prox Cx lesion, 40% stenosed.   Degenerative disc disease, lumbar 09/07/2013   DJD (degenerative joint disease)    Drug overdose, multiple drugs 08/23/2016   Elevated liver function tests    Emphysema of lung (HCC)    Family history of premature coronary artery disease 08/18/2015   Fibromyalgia    GERD 10/28/2006   Qualifier: Diagnosis of   By: Antonio ROSALEA Rockers         Head injury    Head injury, closed, with concussion    x 3  from falls   History of rhabdomyolysis 12/26/2015   History of suicidal ideation 10/02/2013   2010, 2012     Hx of renal failure 12/26/2015   Hypercalcemia 10/27/2023   Hyperlipidemia    Hypothyroidism    Inguinal hernia    left   Insomnia    Irritable bowel syndrome 08/09/2008   Qualifier: Diagnosis of   By: Avram MD, NOLIA Lupita BRAVO  Leg pain 02/24/2011   Liver injury 12/26/2015   Multiple falls 10/02/2013   Myofascial pain syndrome    Neck pain 02/24/2011   Osteopenia    PAIN, CHRONIC NEC 10/28/2006   Qualifier: Diagnosis of   By: Antonio DO, Yvonne         Postural imbalance with levoscoliosis, h/o 10/02/2013   Primary hypertension 08/23/2016   Primary localized osteoarthritis of right hip 03/15/2019   Primary osteoarthritis of left hip 03/05/2021   Primary osteoarthritis of right hip 03/15/2019   Pseudoarthrosis of cervical spine (HCC) 12/23/2017   Rhabdomyolysis 08/23/2016   Right hip pain 02/24/2011   Spinal cord stimulator, status-post 10/02/2013   Per Dr. 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   Bipolar disorder current episode depressed (HCC) 05/12/2024   Carotid artery aneurysm 04/07/2024   Cerebral aneurysm 03/21/2024   Anxiety    Arthritis    Bipolar affective disorder (HCC)    Coronary artery disease    DJD (degenerative joint disease)    Elevated liver function tests    Fibromyalgia    Head injury    Head injury, closed, with concussion    Inguinal hernia    Insomnia    Myofascial pain syndrome    Osteopenia    Tibial plateau fracture, left    Tremors of nervous system    Hypercalcemia 10/27/2023   Chronic nausea 10/27/2023   Statin myopathy 10/27/2023   Barrett's esophagus with dysplasia 10/27/2023   Tremor 09/15/2023   Primary osteoarthritis of left hip 03/05/2021   Primary localized osteoarthritis of right hip 03/15/2019   Primary osteoarthritis of right hip 03/15/2019   Pseudoarthrosis of cervical spine (HCC) 12/23/2017   Spinal stenosis of cervical region 09/21/2017   AKI (acute kidney injury) 08/23/2016   Transaminitis 08/23/2016   Drug overdose, multiple drugs 08/23/2016   Tachycardia 08/23/2016   Primary hypertension 08/23/2016   Accelerated hypertension 08/23/2016   Cocaine use disorder, mild, abuse (HCC) 12/26/2015   History of rhabdomyolysis 12/26/2015   Liver injury 12/26/2015   Hx of renal failure 12/26/2015   Angina, class III 08/18/2015   Family history of premature coronary artery disease 08/18/2015   Tobacco use disorder, continuous 08/18/2015   Chronic kidney disease 2017   Multiple falls 10/02/2013   Chronic back pain 10/02/2013   Spinal cord stimulator, status-post 10/02/2013   History of suicidal ideation 10/02/2013   Postural imbalance with levoscoliosis, h/o 10/02/2013   Hypothyroidism 10/02/2013   Degenerative disc disease,  lumbar 09/07/2013   Leg pain 02/24/2011   Neck pain 02/24/2011   Right hip pain 02/24/2011   Irritable bowel syndrome 08/09/2008   GAD (generalized anxiety disorder) 10/28/2006   DEPRESSION 10/28/2006   PAIN, CHRONIC NEC 10/28/2006   GERD 10/28/2006   Hyperlipidemia 10/28/2006   Bipolar I disorder, most recent episode (or current) manic (HCC) 10/28/2006    Past Surgical History:  Procedure Laterality Date   ANTERIOR CERVICAL DECOMP/DISCECTOMY FUSION N/A 09/21/2017   Procedure: ANTERIOR CERVICAL DECOMPRESSION/DISCECTOMY FUSION - CERVICAL THREE-CERVICAL FOUR;  Surgeon: Onetha Kuba, MD;  Location:  [facility] OR;  Service: Neurosurgery;  Laterality: N/A;   ANTERIOR CERVICAL DECOMP/DISCECTOMY FUSION N/A 12/23/2017   Procedure: REMOVAL AND REPLACEMENT OF CERVICAL THREE-FOUR HARDWARE;  Surgeon: Onetha Kuba, MD;  Location: [facility] OR;  Service: Neurosurgery;  Laterality: N/A;   CARDIAC CATHETERIZATION N/A 08/31/2015   Procedure: Left Heart Cath and Coronary Angiography;  Surgeon: Alm LELON Clay, MD;  Location: [facility] INVASIVE CV LAB;  Service: Cardiovascular;  Laterality: N/A;   Carotid Dopplers Bilateral 02/20/2015   [facility]: Mild, less than 39% left and right internal carotid artery stenosis. No significant plaque burden   CERVICAL DISC SURGERY     C5-7   COLONOSCOPY     COLONOSCOPY     ESOPHAGOGASTRODUODENOSCOPY     FASCIOTOMY Left 10/20/2013   Procedure: LEFT leg ANTERIOR COMPARTMENT FACSCIOTOMY;  Surgeon: Ozell VEAR Bruch, MD;  Location: [facility] OR;  Service: Orthopedics;  Laterality: Left;   INGUINAL HERNIA REPAIR Left    Nuclear Stress Test  03/06/2015   [facility]: Normal EKG. Low normal EF (49%) normal regional wall motion. No evidence of ischemia or infarction.   ORIF TIBIA PLATEAU Left 10/20/2013   Procedure: OPEN REDUCTION INTERNAL FIXATION (ORIF) LEFT TIBIAL PLATEAU;  Surgeon: Ozell VEAR Bruch, MD;  Location: [facility] OR;  Service: Orthopedics;  Laterality: Left;   prostate  implant  10/20/2023   SEPTOPLASTY  2010   SPINAL CORD STIMULATOR BATTERY EXCHANGE N/A 02/08/2016   Procedure: Lumbar spinal cord stimulator implantable pulse generator replacement;  Surgeon: Deward Fabian, MD;  Location: [facility] NEURO ORS;  Service: Neurosurgery;  Laterality: N/A;   SPINAL CORD STIMULATOR BATTERY EXCHANGE N/A 12/23/2017   Procedure: SPINAL CORD STIMULATOR BATTERY EXCHANGE;  Surgeon: Fabian Deward, MD;  Location: [facility] OR;  Service: Neurosurgery;  Laterality: N/A;   SPINAL CORD STIMULATOR IMPLANT     TOTAL HIP ARTHROPLASTY Right 03/15/2019   Procedure: RIGHT TOTAL HIP ARTHROPLASTY ANTERIOR APPROACH;  Surgeon: Sheril Coy, MD;  Location: [facility] ORS;  Service: Orthopedics;  Laterality: Right;   TOTAL HIP ARTHROPLASTY Left 03/05/2021   Procedure: LEFT TOTAL HIP ARTHROPLASTY ANTERIOR APPROACH;  Surgeon: Sheril Coy, MD;  Location: [facility] ORS;  Service: Orthopedics;  Laterality: Left;   TRANSTHORACIC ECHOCARDIOGRAM  02/20/2015   [facility]: Mild concentric LVH. EF 55-60%. Normal regional wall motion. Mild to moderate TR with no significant pulmonary hypertension.   UPPER GASTROINTESTINAL ENDOSCOPY         Home Medications    Prior to Admission medications  Medication Sig Start Date End Date Taking? Authorizing Provider  acetaminophen  (TYLENOL ) 325 MG tablet Take 2 tablets (650 mg total) by mouth every 4 (four) hours as needed for mild pain (1-3) or moderate pain (4-6). 10/20/23  Yes   albuterol  (VENTOLIN  HFA) 108 (90 Base) MCG/ACT inhaler Inhale 2 puffs into the lungs every 6 (six) hours as needed for wheezing or shortness of breath. 02/09/24  Yes Rothfuss, Jacob T, PA-C  amantadine  (SYMMETREL ) 100 MG capsule Take 1 capsule (100 mg total) by mouth 2 (two) times daily. 04/18/24  Yes Rothfuss, Jacob T, PA-C  aspirin  EC (ASPIRIN  81) 81 MG tablet Take 1 tablet (81 mg total) by mouth daily. 10/06/23  Yes   busPIRone  (BUSPAR ) 30 MG tablet Take 30 mg by mouth in the morning and at  bedtime.   Yes [provider]  ergocalciferol  (VITAMIN D2) 1.25 MG (50000 UT) capsule Take 1 capsule (50,000 Units total) by mouth once a week. 10/05/23  Yes   esomeprazole  (NEXIUM ) 40 MG capsule  Take 1 capsule (40 mg total) by mouth daily. 01/07/24  Yes Craig Alan SAUNDERS, PA-C  famotidine  (PEPCID ) 20 MG tablet Take 1 tablet (20 mg total) by mouth 2 (two) times daily. 01/07/24  Yes Craig Alan R, PA-C  hydrOXYzine  (ATARAX ) 25 MG tablet Take 1 tablet (25 mg total) by mouth 3 (three) times daily. 04/03/22  Yes   ipratropium-albuterol  (DUONEB) 0.5-2.5 (3) MG/3ML SOLN Take 3 mLs by nebulization every 4 (four) hours as needed. 05/29/24  Yes Ival Domino, FNP  isosorbide  mononitrate (IMDUR ) 30 MG 24 hr tablet Take 1 tablet (30 mg total) by mouth daily. 03/04/24  Yes Krasowski, Robert J, MD  levothyroxine  (SYNTHROID ) 50 MCG tablet Take 1 tablet (50 mcg total) by mouth daily. 02/28/24  Yes   METAMUCIL FIBER PO Take by mouth. 2 tsp daily Patient taking differently: Take by mouth as needed. 2 tsp   Yes [provider]  minocycline  (MINOCIN ) 100 MG capsule Take 1 capsule (100 mg total) by mouth 2 (two) times daily. 03/14/24  Yes   mometasone -formoterol  (DULERA ) 100-5 MCG/ACT AERO Inhale 2 puffs into the lungs 2 (two) times daily. Wash mouth out with water  after use. 02/16/24  Yes Rothfuss, Jacob T, PA-C  Multiple Vitamins-Minerals (CERTAVITE/ANTIOXIDANTS) TABS Take 1 tablet by mouth daily. 12/11/22  Yes   nicotine  (NICODERM CQ ) 21 mg/24hr patch Place 1 patch (21 mg total) onto the skin daily. 12/29/23  Yes Rothfuss, Jacob T, PA-C  nicotine  polacrilex (NICOTINE  MINI) 2 MG lozenge Take 1 lozenge (2 mg total) by mouth as needed for smoking cessation. 01/07/24  Yes Rothfuss, Jacob T, PA-C  OLANZapine  (ZYPREXA ) 10 MG tablet Take 2 tablets (20 mg total) by mouth at bedtime AND 1 tablet (10 mg total) daily. 05/16/24  Yes Rollene Katz, MD  OLANZapine  (ZYPREXA ) 20 MG tablet Take 1 tablet (20 mg total) by  mouth at bedtime. 05/16/24  Yes Rollene Katz, MD  olmesartan  (BENICAR ) 20 MG tablet Take 1 tablet (20 mg total) by mouth daily. Start with a half tab and increase to a full tablet if BP is >140/90. 12/29/23  Yes Rothfuss, Jacob T, PA-C  ondansetron  (ZOFRAN ) 4 MG tablet Take 1 tablet (4 mg total) by mouth 4 (four) times daily as needed. 01/07/24  Yes Craig Alan R, PA-C  predniSONE  (DELTASONE ) 20 MG tablet Take 20 mg, #2 pills (40 mg dose) daily x 3 days, then take 20 mg, #1 pill (20 mg dose) daily x 3 days. 05/29/24  Yes Ival Domino, FNP  promethazine -dextromethorphan (PROMETHAZINE -DM) 6.25-15 MG/5ML syrup Take 5 mLs by mouth 4 (four) times daily as needed for cough. Do not use and drive - May make drowsy. 05/29/24  Yes Ival Domino, FNP  traZODone  (DESYREL ) 100 MG tablet Take 100 mg by mouth at bedtime.   Yes [provider]  diclofenac  Sodium (VOLTAREN ) 1 % GEL Apply 2 - 4 grams to painful joints up to 4 times daily if needed. 09/15/23   Rothfuss, Jacob T, PA-C  dicyclomine  (BENTYL ) 10 MG capsule Take 1 capsule (10 mg total) by mouth every 8 (eight) hours as needed. 01/07/24   Craig Alan SAUNDERS, PA-C  docusate sodium  (COLACE) 100 MG capsule Take 1 capsule (100 mg total) by mouth 2 (two) times daily as needed for constipation. 10/20/23     Evolocumab  (REPATHA  SURECLICK) 140 MG/ML SOAJ Inject 140 mg into the skin every 14 (fourteen) days. 09/15/23   Rothfuss, Jacob T, PA-C  methocarbamol  (ROBAXIN ) 750 MG tablet Take 1 tablet (750 mg  total) by mouth 4 (four) times daily as needed. 04/07/24   Rothfuss, Jacob T, PA-C  naloxone  (NARCAN ) nasal spray 4 mg/0.1 mL Use 1 spray in nose as directed 04/07/24   Rothfuss, Jacob T, PA-C  Oxycodone  HCl 10 MG TABS Take 1 tablet (10 mg total) by mouth 4 (four) times daily. 05/02/24       Family History Family History  Problem Relation Age of Onset   Heart disease Mother    Hypertension Mother    Sudden death Mother        Presumably cardiac    Hyperlipidemia Father    Heart attack Father 24       Multiple heart attacks   Heart failure Father    Diabetes Father    Hypertension Father    Kidney disease Father    Diabetes Brother    Lung cancer Maternal Grandmother    Diabetes Paternal Grandmother    Sudden death Paternal Grandmother        Unclear etiology   Epilepsy Daughter    Diabetes Other    Kidney disease Other    Colon cancer Neg Hx    Mental illness Neg Hx    Esophageal cancer Neg Hx    Stomach cancer Neg Hx    Rectal cancer Neg Hx     Social History Social History[1]   Allergies   Suvorexant , Flomax [tamsulosin], Lyrica [pregabalin], Statins, Zolpidem  tartrate, Demerol [meperidine], and Mirtazapine   Review of Systems Review of Systems  Constitutional:  Negative for chills and fever.  HENT:  Positive for congestion, postnasal drip and rhinorrhea. Negative for ear pain and sore throat.   Eyes:  Negative for pain and visual disturbance.  Respiratory:  Positive for cough, chest tightness, shortness of breath and wheezing.   Cardiovascular:  Negative for chest pain and palpitations.  Gastrointestinal:  Negative for abdominal pain, constipation, diarrhea, nausea and vomiting.  Genitourinary:  Negative for dysuria and hematuria.  Musculoskeletal:  Negative for arthralgias and back pain.  Skin:  Negative for color change and rash.  Neurological:  Negative for seizures and syncope.  All other systems reviewed and are negative.    Physical Exam Triage Vital Signs ED Triage Vitals  Encounter Vitals Group     BP 05/29/24 1002 (!) 83/60     Girls Systolic BP Percentile --      Girls Diastolic BP Percentile --      Boys Systolic BP Percentile --      Boys Diastolic BP Percentile --      Pulse Rate 05/29/24 1002 89     Resp 05/29/24 1001 18     Temp 05/29/24 1002 98.7 F (37.1 C)     Temp Source 05/29/24 1002 Oral     SpO2 05/29/24 1001 96 %     Weight --      Height --      Head Circumference --       Peak Flow --      Pain Score 05/29/24 1008 5     Pain Loc --      Pain Education --      Exclude from Growth Chart --    No data found.  Updated Vital Signs BP 95/67 (BP Location: Right Arm)   Pulse 91   Temp 98.7 F (37.1 C) (Oral)   Resp 18   SpO2 97%   Visual Acuity Right Eye Distance:   Left Eye Distance:   Bilateral Distance:    Right  Eye Near:   Left Eye Near:    Bilateral Near:     Physical Exam Vitals and nursing note reviewed.  Constitutional:      General: He is not in acute distress.    Appearance: He is well-developed. He is ill-appearing. He is not toxic-appearing or diaphoretic.  HENT:     Head: Normocephalic and atraumatic.     Right Ear: Hearing, tympanic membrane, ear canal and external ear normal.     Left Ear: Hearing, tympanic membrane, ear canal and external ear normal.     Nose: Congestion and rhinorrhea present. Rhinorrhea is clear.     Right Sinus: No maxillary sinus tenderness or frontal sinus tenderness.     Left Sinus: No maxillary sinus tenderness or frontal sinus tenderness.     Mouth/Throat:     Lips: Pink.     Mouth: Mucous membranes are moist.     Pharynx: Uvula midline. No oropharyngeal exudate or posterior oropharyngeal erythema.     Tonsils: No tonsillar exudate.  Eyes:     Conjunctiva/sclera: Conjunctivae normal.     Pupils: Pupils are equal, round, and reactive to light.  Cardiovascular:     Rate and Rhythm: Normal rate and regular rhythm.     Heart sounds: S1 normal and S2 normal. No murmur heard. Pulmonary:     Effort: Pulmonary effort is normal. No respiratory distress.     Breath sounds: Examination of the right-upper field reveals wheezing and rhonchi. Examination of the left-upper field reveals wheezing and rhonchi. Examination of the right-middle field reveals wheezing and rhonchi. Examination of the left-middle field reveals wheezing and rhonchi. Examination of the right-lower field reveals decreased breath sounds.  Examination of the left-lower field reveals decreased breath sounds. Decreased breath sounds, wheezing (Mild to moderate) and rhonchi (Mild) present. No rales.     Comments: Reassessment after DuoNeb treatment: Continues with some wheezing but breath sounds improved significantly with the DuoNeb treatment.  Oxygen saturation went up to 97% after the DuoNeb treatment. Abdominal:     General: Bowel sounds are normal.     Palpations: Abdomen is soft.     Tenderness: There is no abdominal tenderness.  Musculoskeletal:        General: No swelling.     Cervical back: Neck supple.  Lymphadenopathy:     Head:     Right side of head: No submental, submandibular, tonsillar, preauricular or posterior auricular adenopathy.     Left side of head: No submental, submandibular, tonsillar, preauricular or posterior auricular adenopathy.     Cervical: No cervical adenopathy.     Right cervical: No superficial cervical adenopathy.    Left cervical: No superficial cervical adenopathy.  Skin:    General: Skin is warm.     Capillary Refill: Capillary refill takes less than 2 seconds.     Findings: No rash.  Neurological:     Mental Status: He is alert and oriented to person, place, and time.  Psychiatric:        Mood and Affect: Mood normal.      UC Treatments / Results  Labs (all labs ordered are listed, but only abnormal results are displayed) Labs Reviewed - No data to display  EKG   Radiology No results found.  Procedures Procedures (including critical care time)  Medications Ordered in UC Medications  ipratropium-albuterol  (DUONEB) 0.5-2.5 (3) MG/3ML nebulizer solution 3 mL (3 mLs Nebulization Given 05/29/24 1100)    Initial Impression / Assessment and Plan / UC Course  I have reviewed the triage vital signs and the nursing notes.  Pertinent labs & imaging results that were available during my care of the patient were reviewed by me and considered in my medical decision making (see  chart for details).  Plan of Care (see discharge instructions for additional patient precautions and education): COPD emphysema exacerbation with cough and recent influenza type A and B infection: Patient was diagnosed with influenza A and B after a window of time when Tamiflu  would be helpful.  He has had no treatment until being seen today.  Prednisone  20 mg, #2 pills (40 mg dose) daily x 3 days, then take 20 mg, #1 pill (20 mg dose) daily x 3 days.  DuoNeb treatments by nebulizer, every 4-6 hours if needed.  Provided a nebulizer device for home use.  Promethazine  DM, 5 mL, every 6 hours if needed for cough.  Chest x-ray is negative for pneumonia.  Follow-up with primary care if symptoms do not improve, if symptoms worsen or if new symptoms occur.  See below for signs and symptoms of worsening condition and reasons to go to an emergency room.  I reviewed the plan of care with the patient and/or the patient's guardian.  The patient and/or guardian had time to ask questions and acknowledged that the questions were answered.   I spent 45 minutes on patient care, including face to face time and care planning/patient management.  Additional time needed for collection of history, review of test, independent review of chest x-ray prior to radiology review, reassessment after his DuoNeb treatment and patient instruction about the discharge plan. Final Clinical Impressions(s) / UC Diagnoses   Final diagnoses:  Acute cough  Type A influenza  Influenza due to influenza virus, type B  Other emphysema (HCC)  Current every day smoker  Shortness of breath  COPD exacerbation (HCC)     Discharge Instructions      COPD emphysema exacerbation with cough and recent influenza type A and B infection: Patient was diagnosed with influenza A and B after a window of time when Tamiflu  would be helpful.  He has had no treatment until being seen today.  Prednisone  20 mg, #2 pills (40 mg dose) daily x 3 days, then  take 20 mg, #1 pill (20 mg dose) daily x 3 days.  DuoNeb treatments by nebulizer, every 4-6 hours if needed.  Provided a nebulizer device for home use.  Promethazine  DM, 5 mL, every 6 hours if needed for cough.  Chest x-ray is negative for pneumonia.  Follow-up with primary care if symptoms do not improve, if symptoms worsen or if new symptoms occur.  See below for signs and symptoms of worsening condition and reasons to go to an emergency room.  Get help right away if: You are short of breath and cannot: Talk in full sentences. Do normal activities. You have chest pain. You feel confused. These symptoms may be an emergency. Call 911 right away. Do not wait to see if the symptoms will go away. Do not drive yourself to the hospital.     ED Prescriptions     Medication Sig Dispense Auth. Provider   ipratropium-albuterol  (DUONEB) 0.5-2.5 (3) MG/3ML SOLN Take 3 mLs by nebulization every 4 (four) hours as needed. 360 mL Ival Domino, FNP   predniSONE  (DELTASONE ) 20 MG tablet Take 20 mg, #2 pills (40 mg dose) daily x 3 days, then take 20 mg, #1 pill (20 mg dose) daily x 3 days. 9 tablet Roby Donaway,  FNP   promethazine -dextromethorphan (PROMETHAZINE -DM) 6.25-15 MG/5ML syrup Take 5 mLs by mouth 4 (four) times daily as needed for cough. Do not use and drive - May make drowsy. 118 mL Ival Domino, FNP      PDMP not reviewed this encounter.    Ival Domino, FNP 05/29/24 1122    Ival Domino, FNP 05/29/24 1124     [1]  Social History Tobacco Use   Smoking status: Every Day    Current packs/day: 0.50    Average packs/day: 1 pack/day for 41.0 years (40.9 ttl pk-yrs)    Types: Cigarettes    Start date: 1985    Passive exposure: Current   Smokeless tobacco: Never  Vaping Use   Vaping status: Every Day   Substances: Nicotine   Substance Use Topics   Alcohol use: No    Alcohol/week: 0.0 standard drinks of alcohol    Comment: none since 1999- heavy drinker in past   Drug use:  Not Currently    Types: Cocaine    Comment: rarely- last [id number] Ival Domino, FNP 05/29/24 1124  "

## 2024-05-29 NOTE — ED Triage Notes (Addendum)
"   Pt reports his chest is burning from the cough. Patient states he has cough up blood last night. Home positive flu test. Missed Tamiflu  window.  "

## 2024-05-29 NOTE — Discharge Instructions (Signed)
 COPD emphysema exacerbation with cough and recent influenza type A and B infection: Patient was diagnosed with influenza A and B after a window of time when Tamiflu  would be helpful.  He has had no treatment until being seen today.  Prednisone  20 mg, #2 pills (40 mg dose) daily x 3 days, then take 20 mg, #1 pill (20 mg dose) daily x 3 days.  DuoNeb treatments by nebulizer, every 4-6 hours if needed.  Provided a nebulizer device for home use.  Promethazine  DM, 5 mL, every 6 hours if needed for cough.  Chest x-ray is negative for pneumonia.  Follow-up with primary care if symptoms do not improve, if symptoms worsen or if new symptoms occur.  See below for signs and symptoms of worsening condition and reasons to go to an emergency room.  Get help right away if: You are short of breath and cannot: Talk in full sentences. Do normal activities. You have chest pain. You feel confused. These symptoms may be an emergency. Call 911 right away. Do not wait to see if the symptoms will go away. Do not drive yourself to the hospital.

## 2024-05-29 NOTE — Progress Notes (Signed)
 Chest x-ray is negative.  Patient was updated during the visit of these results.

## 2024-05-30 ENCOUNTER — Ambulatory Visit (HOSPITAL_BASED_OUTPATIENT_CLINIC_OR_DEPARTMENT_OTHER): Payer: MEDICAID | Admitting: Student

## 2024-05-30 ENCOUNTER — Other Ambulatory Visit (HOSPITAL_BASED_OUTPATIENT_CLINIC_OR_DEPARTMENT_OTHER): Payer: Self-pay

## 2024-06-05 ENCOUNTER — Other Ambulatory Visit (HOSPITAL_BASED_OUTPATIENT_CLINIC_OR_DEPARTMENT_OTHER): Payer: Self-pay

## 2024-06-06 ENCOUNTER — Other Ambulatory Visit (HOSPITAL_BASED_OUTPATIENT_CLINIC_OR_DEPARTMENT_OTHER): Payer: Self-pay

## 2024-06-07 ENCOUNTER — Other Ambulatory Visit (HOSPITAL_BASED_OUTPATIENT_CLINIC_OR_DEPARTMENT_OTHER): Payer: Self-pay

## 2024-06-08 ENCOUNTER — Other Ambulatory Visit: Payer: Self-pay

## 2024-06-13 ENCOUNTER — Other Ambulatory Visit: Payer: Self-pay

## 2024-06-13 ENCOUNTER — Other Ambulatory Visit (HOSPITAL_BASED_OUTPATIENT_CLINIC_OR_DEPARTMENT_OTHER): Payer: Self-pay

## 2024-06-13 ENCOUNTER — Other Ambulatory Visit (HOSPITAL_BASED_OUTPATIENT_CLINIC_OR_DEPARTMENT_OTHER): Payer: Self-pay | Admitting: Student

## 2024-06-13 MED ORDER — ERGOCALCIFEROL 1.25 MG (50000 UT) PO CAPS
50000.0000 [IU] | ORAL_CAPSULE | ORAL | 0 refills | Status: AC
  Filled 2024-06-20: qty 8, 56d supply, fill #0

## 2024-06-13 MED ORDER — OLANZAPINE 10 MG PO TABS
10.0000 mg | ORAL_TABLET | Freq: Every day | ORAL | 0 refills | Status: AC
  Filled 2024-06-13: qty 30, 30d supply, fill #0

## 2024-06-13 MED ORDER — OLANZAPINE 20 MG PO TABS
20.0000 mg | ORAL_TABLET | Freq: Every day | ORAL | 0 refills | Status: AC
  Filled 2024-06-13 – 2024-06-17 (×2): qty 30, 30d supply, fill #0

## 2024-06-13 MED ORDER — LEVOTHYROXINE SODIUM 50 MCG PO TABS
50.0000 ug | ORAL_TABLET | Freq: Every day | ORAL | 1 refills | Status: AC
  Filled 2024-06-13: qty 90, 90d supply, fill #0

## 2024-06-17 ENCOUNTER — Other Ambulatory Visit (HOSPITAL_BASED_OUTPATIENT_CLINIC_OR_DEPARTMENT_OTHER): Payer: Self-pay

## 2024-06-17 MED ORDER — OXYCODONE HCL 10 MG PO TABS
10.0000 mg | ORAL_TABLET | Freq: Four times a day (QID) | ORAL | 0 refills | Status: DC
  Filled 2024-06-17: qty 56, 14d supply, fill #0

## 2024-06-19 ENCOUNTER — Other Ambulatory Visit (HOSPITAL_BASED_OUTPATIENT_CLINIC_OR_DEPARTMENT_OTHER): Payer: Self-pay | Admitting: Student

## 2024-06-19 DIAGNOSIS — J410 Simple chronic bronchitis: Secondary | ICD-10-CM

## 2024-06-20 ENCOUNTER — Other Ambulatory Visit: Payer: Self-pay

## 2024-06-20 ENCOUNTER — Other Ambulatory Visit (HOSPITAL_BASED_OUTPATIENT_CLINIC_OR_DEPARTMENT_OTHER): Payer: Self-pay

## 2024-06-20 MED ORDER — DULERA 100-5 MCG/ACT IN AERO
2.0000 | INHALATION_SPRAY | Freq: Two times a day (BID) | RESPIRATORY_TRACT | 3 refills | Status: AC
  Filled 2024-06-20: qty 13, 30d supply, fill #0

## 2024-07-01 ENCOUNTER — Other Ambulatory Visit (HOSPITAL_BASED_OUTPATIENT_CLINIC_OR_DEPARTMENT_OTHER): Payer: Self-pay

## 2024-07-01 MED ORDER — OXYCODONE HCL 10 MG PO TABS
10.0000 mg | ORAL_TABLET | Freq: Four times a day (QID) | ORAL | 0 refills | Status: AC
  Filled 2024-07-01: qty 120, 30d supply, fill #0

## 2024-07-04 ENCOUNTER — Other Ambulatory Visit (HOSPITAL_BASED_OUTPATIENT_CLINIC_OR_DEPARTMENT_OTHER): Payer: Self-pay

## 2024-07-08 ENCOUNTER — Telehealth: Payer: Self-pay

## 2024-07-08 NOTE — Transitions of Care (Post Inpatient/ED Visit) (Unsigned)
 "  07/08/2024  Name: [name] MRN: [id number] DOB: 26-Sep-1965  Today's TOC FU Call Status: Today's TOC FU Call Status:: Successful TOC FU Call Completed TOC FU Call Complete Date: 07/08/24  Patient's Name and Date of Birth confirmed. Name, DOB  Transition Care Management Follow-up Telephone Call Date of Discharge: 07/07/24 Discharge Facility: Other ([facility]) Name of Other ([facility]) Discharge Facility: [facility] Type of Discharge: Inpatient Admission Primary Inpatient Discharge Diagnosis:: Renal failure How have you been since you were released from the hospital?: Better Any questions or concerns?: No  Items Reviewed: Did you receive and understand the discharge instructions provided?: No Medications obtained,verified, and reconciled?: Yes (Medications Reviewed) Any new allergies since your discharge?: No Dietary orders reviewed?: Yes Do you have support at home?: No  Medications Reviewed Today: Medications Reviewed Today     Reviewed by Emmitt Pan, LPN (Licensed Practical Nurse) on 07/08/24 at 0848  Med List Status: <None>   Medication Order Taking? Sig Documenting Provider Last Dose Status Informant  acetaminophen  (TYLENOL ) 325 MG tablet [id number] Yes Take 2 tablets (650 mg total) by mouth every 4 (four) hours as needed for mild pain (1-3) or moderate pain (4-6).   Active   albuterol  (VENTOLIN  HFA) 108 (90 Base) MCG/ACT inhaler [id number] Yes Inhale 2 puffs into the lungs every 6 (six) hours as needed for wheezing or shortness of breath. Rothfuss, Jacob T, PA-C  Active   amantadine  (SYMMETREL ) 100 MG capsule [id number] Yes Take 1 capsule (100 mg total) by mouth 2 (two) times daily. Rothfuss, Jacob T, PA-C  Active   aspirin  EC (ASPIRIN  81) 81 MG tablet [id number] Yes Take 1 tablet (81 mg total) by mouth daily.   Active   busPIRone  (BUSPAR ) 30 MG tablet [id number] Yes Take 30 mg by mouth in the morning and at bedtime. [provider]  Active   diclofenac   Sodium (VOLTAREN ) 1 % GEL [id number] Yes Apply 2 - 4 grams to painful joints up to 4 times daily if needed. Rothfuss, Jacob T, PA-C  Active   dicyclomine  (BENTYL ) 10 MG capsule [id number] Yes Take 1 capsule (10 mg total) by mouth every 8 (eight) hours as needed. Craig Alan SAUNDERS, PA-C  Active   docusate sodium  (COLACE) 100 MG capsule [id number] Yes Take 1 capsule (100 mg total) by mouth 2 (two) times daily as needed for constipation.   Active   ergocalciferol  (VITAMIN D2) 1.25 MG (50000 UT) capsule [id number] Yes Take 1 capsule (50,000 Units total) by mouth once a week. Begin 2,000 international units vitamin D  daily, available over the counter, after finishing this prescription. Rothfuss, Jacob T, PA-C  Active   esomeprazole  (NEXIUM ) 40 MG capsule [id number] Yes Take 1 capsule (40 mg total) by mouth daily. Craig Alan SAUNDERS, PA-C  Active   Evolocumab  (REPATHA  SURECLICK) 140 MG/ML EMMANUEL [id number] Yes Inject 140 mg into the skin every 14 (fourteen) days. Rothfuss, Jacob T, PA-C  Active            Med Note (PAYNE, ELENA M   Thu May 12, 2024  7:38 AM) Next dose Sunday dec 14   famotidine  (PEPCID ) 20 MG tablet [id number] Yes Take 1 tablet (20 mg total) by mouth 2 (two) times daily. Craig Alan SAUNDERS, PA-C  Active   hydrOXYzine  (ATARAX ) 25 MG tablet [id number] Yes Take 1 tablet (25 mg total) by mouth 3 (three) times daily.   Active   ipratropium-albuterol  (DUONEB) 0.5-2.5 (3) MG/3ML SOLN [id number] Yes Take 3 mLs by nebulization every 4 (four)  hours as needed. Ival Domino, FNP  Active   isosorbide  mononitrate (IMDUR ) 30 MG 24 hr tablet [id number] Yes Take 1 tablet (30 mg total) by mouth daily. Krasowski, Robert J, MD  Active   levothyroxine  (SYNTHROID ) 50 MCG tablet [id number] Yes Take 1 tablet (50 mcg total) by mouth daily. Rothfuss, Jacob T, PA-C  Active   METAMUCIL FIBER PO [id number] Yes Take by mouth. 2 tsp daily  Patient taking differently: Take by mouth as needed. 2 tsp   [provider]  Active    methocarbamol  (ROBAXIN ) 750 MG tablet [id number] Yes Take 1 tablet (750 mg total) by mouth 4 (four) times daily as needed. Rothfuss, Jacob T, PA-C  Active   minocycline  (MINOCIN ) 100 MG capsule [id number] Yes Take 1 capsule (100 mg total) by mouth 2 (two) times daily.   Active   mometasone -formoterol  (DULERA ) 100-5 MCG/ACT AERO [id number] Yes Inhale 2 puffs into the lungs 2 (two) times daily. Wash mouth out with water  after use. Rothfuss, Jacob T, PA-C  Active   Multiple Vitamins-Minerals (CERTAVITE/ANTIOXIDANTS) TABS [id number] Yes Take 1 tablet by mouth daily.   Active   naloxone  (NARCAN ) nasal spray 4 mg/0.1 mL [id number] Yes Use 1 spray in nose as directed Rothfuss, Jacob T, PA-C  Active   nicotine  (NICODERM CQ ) 21 mg/24hr patch [id number] Yes Place 1 patch (21 mg total) onto the skin daily. Rothfuss, Jacob T, PA-C  Active   nicotine  polacrilex (NICOTINE  MINI) 2 MG lozenge [id number] Yes Take 1 lozenge (2 mg total) by mouth as needed for smoking cessation. Rothfuss, Jacob T, PA-C  Active   OLANZapine  (ZYPREXA ) 10 MG tablet [id number] Yes Take 2 tablets (20 mg total) by mouth at bedtime AND 1 tablet (10 mg total) daily. Rollene Katz, MD  Active   OLANZapine  (ZYPREXA ) 10 MG tablet [id number] Yes Take 1 tablet (10 mg total) by mouth daily. Rollene Katz, MD  Active   OLANZapine  (ZYPREXA ) 20 MG tablet [id number] Yes Take 1 tablet (20 mg total) by mouth at bedtime. Rollene Katz, MD  Active   OLANZapine  (ZYPREXA ) 20 MG tablet [id number] Yes Take 1 tablet (20 mg total) by mouth at bedtime. Rollene Katz, MD  Active   olmesartan  (BENICAR ) 20 MG tablet [id number] Yes Take 1 tablet (20 mg total) by mouth daily. Start with a half tab and increase to a full tablet if BP is >140/90. Rothfuss, Jacob T, PA-C  Active   ondansetron  (ZOFRAN ) 4 MG tablet [id number] Yes Take 1 tablet (4 mg total) by mouth 4 (four) times daily as needed. Craig Alan SAUNDERS, PA-C  Active   Oxycodone  HCl 10 MG TABS [id number] Yes  Take 1 tablet (10 mg total) by mouth 4 (four) times daily.   Active   predniSONE  (DELTASONE ) 20 MG tablet [id number] Yes Take 20 mg, #2 pills (40 mg dose) daily x 3 days, then take 20 mg, #1 pill (20 mg dose) daily x 3 days. Ival Domino, FNP  Active   promethazine -dextromethorphan (PROMETHAZINE -DM) 6.25-15 MG/5ML syrup [id number] Yes Take 5 mLs by mouth 4 (four) times daily as needed for cough. Do not use and drive - May make drowsy. Ival Domino, FNP  Active   traZODone  (DESYREL ) 100 MG tablet [id number] Yes Take 100 mg by mouth at bedtime. [provider]  Active   Med List Note Marisa Nathanel SAILOR, CPhT 10/23/20 2053): The patient's wife can assist with the med list            Home Care and Equipment/Supplies: Were  Home Health Services Ordered?: NA Any new equipment or medical supplies ordered?: NA  Functional Questionnaire: Do you need assistance with bathing/showering or dressing?: No Do you need assistance with meal preparation?: No Do you need assistance with eating?: No Do you have difficulty maintaining continence: No Do you need assistance with getting out of bed/getting out of a chair/moving?: No Do you have difficulty managing or taking your medications?: No  Follow up appointments reviewed: PCP Follow-up appointment confirmed?: No (sent message to staff to schedule) MD Provider Line Number:[phone] Given: No Specialist Hospital Follow-up appointment confirmed?: NA Do you need transportation to your follow-up appointment?: No    SIGNATURE Julian Lemmings, LPN [facility] Nurse Health Advisor Direct Dial [phone]  "

## 2024-07-25 ENCOUNTER — Inpatient Hospital Stay (HOSPITAL_BASED_OUTPATIENT_CLINIC_OR_DEPARTMENT_OTHER): Payer: MEDICAID | Admitting: Family Medicine

## 2024-10-05 ENCOUNTER — Ambulatory Visit (HOSPITAL_BASED_OUTPATIENT_CLINIC_OR_DEPARTMENT_OTHER): Payer: MEDICAID | Admitting: Student
